# Patient Record
Sex: Male | Born: 1935 | Race: White | Hispanic: No | Marital: Married | Smoking: Former smoker
Health system: Southern US, Community
[De-identification: ages and names within clinical notes are randomized; demographics above are authoritative.]

## PROBLEM LIST (undated history)

## (undated) DIAGNOSIS — I700 Atherosclerosis of aorta: Secondary | ICD-10-CM

## (undated) DIAGNOSIS — E538 Deficiency of other specified B group vitamins: Secondary | ICD-10-CM

## (undated) DIAGNOSIS — I4891 Unspecified atrial fibrillation: Secondary | ICD-10-CM

## (undated) DIAGNOSIS — E8809 Other disorders of plasma-protein metabolism, not elsewhere classified: Secondary | ICD-10-CM

## (undated) DIAGNOSIS — N401 Enlarged prostate with lower urinary tract symptoms: Secondary | ICD-10-CM

## (undated) DIAGNOSIS — I4589 Other specified conduction disorders: Secondary | ICD-10-CM

## (undated) DIAGNOSIS — R9430 Abnormal result of cardiovascular function study, unspecified: Secondary | ICD-10-CM

## (undated) DIAGNOSIS — B029 Zoster without complications: Secondary | ICD-10-CM

## (undated) DIAGNOSIS — G4733 Obstructive sleep apnea (adult) (pediatric): Secondary | ICD-10-CM

## (undated) DIAGNOSIS — C4490 Unspecified malignant neoplasm of skin, unspecified: Secondary | ICD-10-CM

## (undated) DIAGNOSIS — R42 Dizziness and giddiness: Secondary | ICD-10-CM

## (undated) DIAGNOSIS — K55059 Acute (reversible) ischemia of intestine, part and extent unspecified: Secondary | ICD-10-CM

## (undated) DIAGNOSIS — I340 Nonrheumatic mitral (valve) insufficiency: Secondary | ICD-10-CM

## (undated) DIAGNOSIS — K5090 Crohn's disease, unspecified, without complications: Secondary | ICD-10-CM

## (undated) DIAGNOSIS — E876 Hypokalemia: Secondary | ICD-10-CM

## (undated) DIAGNOSIS — Z7901 Long term (current) use of anticoagulants: Secondary | ICD-10-CM

## (undated) DIAGNOSIS — K55069 Acute infarction of intestine, part and extent unspecified: Secondary | ICD-10-CM

## (undated) DIAGNOSIS — D509 Iron deficiency anemia, unspecified: Secondary | ICD-10-CM

## (undated) DIAGNOSIS — K635 Polyp of colon: Secondary | ICD-10-CM

## (undated) DIAGNOSIS — I5022 Chronic systolic (congestive) heart failure: Secondary | ICD-10-CM

## (undated) DIAGNOSIS — B370 Candidal stomatitis: Secondary | ICD-10-CM

## (undated) DIAGNOSIS — N644 Mastodynia: Secondary | ICD-10-CM

## (undated) DIAGNOSIS — G3184 Mild cognitive impairment, so stated: Secondary | ICD-10-CM

## (undated) DIAGNOSIS — N138 Other obstructive and reflux uropathy: Secondary | ICD-10-CM

## (undated) DIAGNOSIS — R5383 Other fatigue: Secondary | ICD-10-CM

## (undated) DIAGNOSIS — K7460 Unspecified cirrhosis of liver: Secondary | ICD-10-CM

## (undated) DIAGNOSIS — R251 Tremor, unspecified: Secondary | ICD-10-CM

## (undated) DIAGNOSIS — R109 Unspecified abdominal pain: Secondary | ICD-10-CM

## (undated) DIAGNOSIS — E785 Hyperlipidemia, unspecified: Secondary | ICD-10-CM

## (undated) DIAGNOSIS — I10 Essential (primary) hypertension: Secondary | ICD-10-CM

## (undated) DIAGNOSIS — J209 Acute bronchitis, unspecified: Secondary | ICD-10-CM

## (undated) DIAGNOSIS — M549 Dorsalgia, unspecified: Secondary | ICD-10-CM

## (undated) DIAGNOSIS — R600 Localized edema: Secondary | ICD-10-CM

## (undated) DIAGNOSIS — I447 Left bundle-branch block, unspecified: Secondary | ICD-10-CM

## (undated) DIAGNOSIS — I739 Peripheral vascular disease, unspecified: Secondary | ICD-10-CM

## (undated) DIAGNOSIS — I255 Ischemic cardiomyopathy: Secondary | ICD-10-CM

## (undated) DIAGNOSIS — I2510 Atherosclerotic heart disease of native coronary artery without angina pectoris: Secondary | ICD-10-CM

## (undated) DIAGNOSIS — D696 Thrombocytopenia, unspecified: Secondary | ICD-10-CM

## (undated) DIAGNOSIS — F419 Anxiety disorder, unspecified: Secondary | ICD-10-CM

## (undated) DIAGNOSIS — M353 Polymyalgia rheumatica: Secondary | ICD-10-CM

## (undated) DIAGNOSIS — E559 Vitamin D deficiency, unspecified: Secondary | ICD-10-CM

## (undated) DIAGNOSIS — K219 Gastro-esophageal reflux disease without esophagitis: Secondary | ICD-10-CM

## (undated) DIAGNOSIS — I779 Disorder of arteries and arterioles, unspecified: Secondary | ICD-10-CM

## (undated) DIAGNOSIS — R001 Bradycardia, unspecified: Secondary | ICD-10-CM

## (undated) DIAGNOSIS — K439 Ventral hernia without obstruction or gangrene: Secondary | ICD-10-CM

## (undated) DIAGNOSIS — Z95810 Presence of automatic (implantable) cardiac defibrillator: Secondary | ICD-10-CM

## (undated) DIAGNOSIS — R739 Hyperglycemia, unspecified: Secondary | ICD-10-CM

## (undated) DIAGNOSIS — L309 Dermatitis, unspecified: Secondary | ICD-10-CM

## (undated) DIAGNOSIS — IMO0002 Reserved for concepts with insufficient information to code with codable children: Secondary | ICD-10-CM

## (undated) DIAGNOSIS — I714 Abdominal aortic aneurysm, without rupture, unspecified: Secondary | ICD-10-CM

## (undated) DIAGNOSIS — M1990 Unspecified osteoarthritis, unspecified site: Secondary | ICD-10-CM

## (undated) DIAGNOSIS — J309 Allergic rhinitis, unspecified: Secondary | ICD-10-CM

## (undated) HISTORY — DX: Other specified conduction disorders: I45.89

## (undated) HISTORY — DX: Unspecified abdominal pain: R10.9

## (undated) HISTORY — DX: Presence of automatic (implantable) cardiac defibrillator: Z95.810

## (undated) HISTORY — DX: Bradycardia, unspecified: R00.1

## (undated) HISTORY — DX: Essential (primary) hypertension: I10

## (undated) HISTORY — DX: Atherosclerosis of aorta: I70.0

## (undated) HISTORY — DX: Deficiency of other specified B group vitamins: E53.8

## (undated) HISTORY — DX: Dizziness and giddiness: R42

## (undated) HISTORY — DX: Atherosclerotic heart disease of native coronary artery without angina pectoris: I25.10

## (undated) HISTORY — DX: Other obstructive and reflux uropathy: N13.8

## (undated) HISTORY — DX: Obstructive sleep apnea (adult) (pediatric): G47.33

## (undated) HISTORY — DX: Hypokalemia: E87.6

## (undated) HISTORY — DX: Vitamin D deficiency, unspecified: E55.9

## (undated) HISTORY — DX: Acute infarction of intestine, part and extent unspecified: K55.069

## (undated) HISTORY — DX: Anxiety disorder, unspecified: F41.9

## (undated) HISTORY — DX: Other fatigue: R53.83

## (undated) HISTORY — DX: Chronic systolic (congestive) heart failure: I50.22

## (undated) HISTORY — DX: Abnormal result of cardiovascular function study, unspecified: R94.30

## (undated) HISTORY — DX: Unspecified atrial fibrillation: I48.91

## (undated) HISTORY — DX: Hyperglycemia, unspecified: R73.9

## (undated) HISTORY — DX: Long term (current) use of anticoagulants: Z79.01

## (undated) HISTORY — PX: OTHER SURGICAL HISTORY: SHX169

## (undated) HISTORY — DX: Allergic rhinitis, unspecified: J30.9

## (undated) HISTORY — DX: Mild cognitive impairment, so stated: G31.84

## (undated) HISTORY — DX: Reserved for concepts with insufficient information to code with codable children: IMO0002

## (undated) HISTORY — DX: Dermatitis, unspecified: L30.9

## (undated) HISTORY — DX: Tremor, unspecified: R25.1

## (undated) HISTORY — DX: Zoster without complications: B02.9

## (undated) HISTORY — DX: Localized edema: R60.0

## (undated) HISTORY — DX: Mastodynia: N64.4

## (undated) HISTORY — DX: Candidal stomatitis: B37.0

## (undated) HISTORY — DX: Abdominal aortic aneurysm, without rupture, unspecified: I71.40

## (undated) HISTORY — DX: Abdominal aortic aneurysm, without rupture: I71.4

## (undated) HISTORY — DX: Iron deficiency anemia, unspecified: D50.9

## (undated) HISTORY — DX: Unspecified cirrhosis of liver: K74.60

## (undated) HISTORY — DX: Hyperlipidemia, unspecified: E78.5

## (undated) HISTORY — DX: Dorsalgia, unspecified: M54.9

## (undated) HISTORY — DX: Acute (reversible) ischemia of intestine, part and extent unspecified: K55.059

## (undated) HISTORY — DX: Acute bronchitis, unspecified: J20.9

## (undated) HISTORY — DX: Other disorders of plasma-protein metabolism, not elsewhere classified: E88.09

## (undated) HISTORY — DX: Benign prostatic hyperplasia with lower urinary tract symptoms: N40.1

## (undated) HISTORY — DX: Polyp of colon: K63.5

## (undated) HISTORY — DX: Unspecified osteoarthritis, unspecified site: M19.90

## (undated) HISTORY — DX: Ventral hernia without obstruction or gangrene: K43.9

## (undated) HISTORY — DX: Nonrheumatic mitral (valve) insufficiency: I34.0

## (undated) HISTORY — DX: Gastro-esophageal reflux disease without esophagitis: K21.9

## (undated) HISTORY — DX: Unspecified malignant neoplasm of skin, unspecified: C44.90

## (undated) HISTORY — DX: Left bundle-branch block, unspecified: I44.7

## (undated) HISTORY — DX: Ischemic cardiomyopathy: I25.5

## (undated) HISTORY — DX: Peripheral vascular disease, unspecified: I73.9

## (undated) HISTORY — DX: Disorder of arteries and arterioles, unspecified: I77.9

## (undated) HISTORY — DX: Polymyalgia rheumatica: M35.3

## (undated) HISTORY — DX: Crohn's disease, unspecified, without complications: K50.90

---

## 1992-02-01 HISTORY — PX: CORONARY ARTERY BYPASS GRAFT: SHX141

## 2000-02-01 HISTORY — PX: OTHER SURGICAL HISTORY: SHX169

## 2000-03-23 ENCOUNTER — Encounter (INDEPENDENT_AMBULATORY_CARE_PROVIDER_SITE_OTHER): Payer: Self-pay | Admitting: Specialist

## 2000-03-23 ENCOUNTER — Encounter: Payer: Self-pay | Admitting: Pulmonary Disease

## 2000-03-23 ENCOUNTER — Observation Stay (HOSPITAL_COMMUNITY): Admission: RE | Admit: 2000-03-23 | Discharge: 2000-03-24 | Payer: Self-pay | Admitting: Pulmonary Disease

## 2000-03-23 ENCOUNTER — Encounter: Payer: Self-pay | Admitting: General Surgery

## 2001-01-31 HISTORY — PX: OTHER SURGICAL HISTORY: SHX169

## 2001-02-01 ENCOUNTER — Encounter (INDEPENDENT_AMBULATORY_CARE_PROVIDER_SITE_OTHER): Payer: Self-pay | Admitting: *Deleted

## 2001-02-01 ENCOUNTER — Ambulatory Visit (HOSPITAL_COMMUNITY): Admission: RE | Admit: 2001-02-01 | Discharge: 2001-02-01 | Payer: Self-pay | Admitting: Pulmonary Disease

## 2001-02-01 ENCOUNTER — Encounter: Payer: Self-pay | Admitting: Pulmonary Disease

## 2001-02-01 ENCOUNTER — Inpatient Hospital Stay (HOSPITAL_COMMUNITY): Admission: EM | Admit: 2001-02-01 | Discharge: 2001-02-15 | Payer: Self-pay | Admitting: Emergency Medicine

## 2001-02-03 ENCOUNTER — Encounter: Payer: Self-pay | Admitting: Pulmonary Disease

## 2001-02-05 ENCOUNTER — Encounter: Payer: Self-pay | Admitting: General Surgery

## 2001-02-05 ENCOUNTER — Encounter: Payer: Self-pay | Admitting: Vascular Surgery

## 2001-02-06 ENCOUNTER — Encounter: Payer: Self-pay | Admitting: Vascular Surgery

## 2001-02-27 ENCOUNTER — Encounter: Payer: Self-pay | Admitting: General Surgery

## 2001-02-27 ENCOUNTER — Ambulatory Visit (HOSPITAL_COMMUNITY): Admission: RE | Admit: 2001-02-27 | Discharge: 2001-02-27 | Payer: Self-pay | Admitting: General Surgery

## 2001-12-01 HISTORY — PX: OTHER SURGICAL HISTORY: SHX169

## 2001-12-03 ENCOUNTER — Encounter: Payer: Self-pay | Admitting: General Surgery

## 2001-12-10 ENCOUNTER — Inpatient Hospital Stay (HOSPITAL_COMMUNITY): Admission: RE | Admit: 2001-12-10 | Discharge: 2001-12-13 | Payer: Self-pay | Admitting: General Surgery

## 2002-07-08 ENCOUNTER — Encounter: Payer: Self-pay | Admitting: Pulmonary Disease

## 2002-07-08 ENCOUNTER — Ambulatory Visit (HOSPITAL_COMMUNITY): Admission: RE | Admit: 2002-07-08 | Discharge: 2002-07-08 | Payer: Self-pay | Admitting: Pulmonary Disease

## 2002-07-10 ENCOUNTER — Encounter: Payer: Self-pay | Admitting: Internal Medicine

## 2002-07-10 ENCOUNTER — Ambulatory Visit (HOSPITAL_COMMUNITY): Admission: RE | Admit: 2002-07-10 | Discharge: 2002-07-10 | Payer: Self-pay | Admitting: Internal Medicine

## 2002-09-23 ENCOUNTER — Ambulatory Visit (HOSPITAL_COMMUNITY): Admission: RE | Admit: 2002-09-23 | Discharge: 2002-09-23 | Payer: Self-pay | Admitting: Gastroenterology

## 2002-09-23 ENCOUNTER — Encounter: Payer: Self-pay | Admitting: Gastroenterology

## 2002-09-25 ENCOUNTER — Encounter: Payer: Self-pay | Admitting: Gastroenterology

## 2002-09-25 ENCOUNTER — Inpatient Hospital Stay (HOSPITAL_COMMUNITY): Admission: AD | Admit: 2002-09-25 | Discharge: 2002-09-27 | Payer: Self-pay | Admitting: Gastroenterology

## 2002-11-07 ENCOUNTER — Ambulatory Visit (HOSPITAL_COMMUNITY): Admission: RE | Admit: 2002-11-07 | Discharge: 2002-11-07 | Payer: Self-pay | Admitting: Gastroenterology

## 2002-11-07 ENCOUNTER — Encounter: Payer: Self-pay | Admitting: Gastroenterology

## 2002-11-14 ENCOUNTER — Inpatient Hospital Stay (HOSPITAL_COMMUNITY): Admission: EM | Admit: 2002-11-14 | Discharge: 2002-11-19 | Payer: Self-pay | Admitting: Gastroenterology

## 2002-11-18 ENCOUNTER — Encounter: Payer: Self-pay | Admitting: Gastroenterology

## 2003-11-12 ENCOUNTER — Ambulatory Visit: Payer: Self-pay | Admitting: Cardiology

## 2004-01-06 ENCOUNTER — Ambulatory Visit: Payer: Self-pay

## 2004-01-14 ENCOUNTER — Ambulatory Visit: Payer: Self-pay | Admitting: Cardiology

## 2004-01-20 ENCOUNTER — Ambulatory Visit: Payer: Self-pay | Admitting: Internal Medicine

## 2004-01-29 ENCOUNTER — Ambulatory Visit: Payer: Self-pay | Admitting: Pulmonary Disease

## 2004-01-30 ENCOUNTER — Ambulatory Visit: Payer: Self-pay | Admitting: Internal Medicine

## 2004-03-03 ENCOUNTER — Ambulatory Visit: Payer: Self-pay | Admitting: Cardiology

## 2004-03-10 ENCOUNTER — Ambulatory Visit: Payer: Self-pay | Admitting: Internal Medicine

## 2004-03-22 ENCOUNTER — Ambulatory Visit: Payer: Self-pay | Admitting: *Deleted

## 2004-04-01 ENCOUNTER — Ambulatory Visit: Payer: Self-pay | Admitting: Cardiology

## 2004-04-22 ENCOUNTER — Ambulatory Visit: Payer: Self-pay | Admitting: Cardiology

## 2004-05-11 ENCOUNTER — Ambulatory Visit: Payer: Self-pay | Admitting: Cardiology

## 2004-05-31 ENCOUNTER — Ambulatory Visit: Payer: Self-pay | Admitting: Cardiology

## 2004-06-14 ENCOUNTER — Ambulatory Visit: Payer: Self-pay | Admitting: Cardiology

## 2004-06-21 ENCOUNTER — Ambulatory Visit: Payer: Self-pay | Admitting: Pulmonary Disease

## 2004-06-24 ENCOUNTER — Ambulatory Visit: Payer: Self-pay | Admitting: Cardiology

## 2004-07-01 ENCOUNTER — Ambulatory Visit: Payer: Self-pay | Admitting: Pulmonary Disease

## 2004-07-05 ENCOUNTER — Ambulatory Visit: Payer: Self-pay | Admitting: Cardiology

## 2004-07-14 ENCOUNTER — Ambulatory Visit: Payer: Self-pay | Admitting: Cardiology

## 2004-07-14 ENCOUNTER — Ambulatory Visit: Payer: Self-pay

## 2004-07-19 ENCOUNTER — Inpatient Hospital Stay (HOSPITAL_BASED_OUTPATIENT_CLINIC_OR_DEPARTMENT_OTHER): Admission: RE | Admit: 2004-07-19 | Discharge: 2004-07-19 | Payer: Self-pay | Admitting: Cardiovascular Disease

## 2004-07-19 ENCOUNTER — Ambulatory Visit: Payer: Self-pay | Admitting: Cardiology

## 2004-07-22 ENCOUNTER — Ambulatory Visit: Payer: Self-pay | Admitting: Cardiovascular Disease

## 2004-07-23 ENCOUNTER — Ambulatory Visit: Payer: Self-pay | Admitting: Cardiology

## 2004-07-29 ENCOUNTER — Ambulatory Visit: Payer: Self-pay | Admitting: Cardiology

## 2004-08-12 ENCOUNTER — Ambulatory Visit: Payer: Self-pay | Admitting: Cardiology

## 2004-08-18 ENCOUNTER — Ambulatory Visit: Payer: Self-pay | Admitting: Internal Medicine

## 2004-09-01 ENCOUNTER — Ambulatory Visit: Payer: Self-pay | Admitting: Internal Medicine

## 2004-09-01 ENCOUNTER — Ambulatory Visit: Payer: Self-pay | Admitting: *Deleted

## 2004-09-01 ENCOUNTER — Ambulatory Visit: Payer: Self-pay

## 2004-09-02 ENCOUNTER — Ambulatory Visit: Payer: Self-pay | Admitting: Internal Medicine

## 2004-09-07 ENCOUNTER — Inpatient Hospital Stay (HOSPITAL_COMMUNITY): Admission: RE | Admit: 2004-09-07 | Discharge: 2004-09-08 | Payer: Self-pay | Admitting: Internal Medicine

## 2004-09-08 ENCOUNTER — Ambulatory Visit: Payer: Self-pay | Admitting: Internal Medicine

## 2004-09-22 ENCOUNTER — Ambulatory Visit: Payer: Self-pay

## 2004-09-29 ENCOUNTER — Ambulatory Visit: Payer: Self-pay | Admitting: *Deleted

## 2004-10-07 ENCOUNTER — Ambulatory Visit: Payer: Self-pay | Admitting: Internal Medicine

## 2004-10-11 ENCOUNTER — Inpatient Hospital Stay (HOSPITAL_COMMUNITY): Admission: AD | Admit: 2004-10-11 | Discharge: 2004-10-12 | Payer: Self-pay | Admitting: Internal Medicine

## 2004-10-11 ENCOUNTER — Ambulatory Visit: Payer: Self-pay | Admitting: Internal Medicine

## 2004-10-15 ENCOUNTER — Ambulatory Visit: Payer: Self-pay

## 2004-10-15 ENCOUNTER — Ambulatory Visit: Payer: Self-pay | Admitting: Cardiology

## 2004-10-21 ENCOUNTER — Ambulatory Visit: Payer: Self-pay | Admitting: Cardiology

## 2004-10-21 ENCOUNTER — Ambulatory Visit: Payer: Self-pay

## 2004-11-01 ENCOUNTER — Ambulatory Visit: Payer: Self-pay | Admitting: Cardiology

## 2004-11-08 ENCOUNTER — Ambulatory Visit: Payer: Self-pay | Admitting: Cardiology

## 2004-11-16 ENCOUNTER — Ambulatory Visit: Payer: Self-pay | Admitting: Cardiology

## 2004-11-23 ENCOUNTER — Ambulatory Visit: Payer: Self-pay | Admitting: Internal Medicine

## 2004-11-30 ENCOUNTER — Ambulatory Visit: Payer: Self-pay | Admitting: Cardiology

## 2004-12-10 ENCOUNTER — Ambulatory Visit: Payer: Self-pay | Admitting: Cardiology

## 2004-12-21 ENCOUNTER — Ambulatory Visit: Payer: Self-pay

## 2004-12-22 ENCOUNTER — Ambulatory Visit: Payer: Self-pay | Admitting: Pulmonary Disease

## 2004-12-30 ENCOUNTER — Ambulatory Visit: Payer: Self-pay | Admitting: Pulmonary Disease

## 2005-01-04 ENCOUNTER — Ambulatory Visit: Payer: Self-pay | Admitting: Cardiology

## 2005-01-18 ENCOUNTER — Ambulatory Visit: Payer: Self-pay | Admitting: Cardiovascular Disease

## 2005-02-01 ENCOUNTER — Ambulatory Visit: Payer: Self-pay | Admitting: *Deleted

## 2005-02-02 ENCOUNTER — Ambulatory Visit: Payer: Self-pay | Admitting: Pulmonary Disease

## 2005-02-04 ENCOUNTER — Ambulatory Visit: Payer: Self-pay | Admitting: Cardiology

## 2005-02-11 ENCOUNTER — Ambulatory Visit: Payer: Self-pay | Admitting: Internal Medicine

## 2005-02-11 ENCOUNTER — Ambulatory Visit: Payer: Self-pay | Admitting: Pulmonary Disease

## 2005-02-23 ENCOUNTER — Ambulatory Visit: Payer: Self-pay | Admitting: Cardiology

## 2005-02-23 ENCOUNTER — Ambulatory Visit: Payer: Self-pay | Admitting: Internal Medicine

## 2005-03-09 ENCOUNTER — Ambulatory Visit: Payer: Self-pay | Admitting: Cardiology

## 2005-03-17 ENCOUNTER — Ambulatory Visit: Payer: Self-pay | Admitting: Pulmonary Disease

## 2005-03-23 ENCOUNTER — Ambulatory Visit: Payer: Self-pay | Admitting: Cardiology

## 2005-04-06 ENCOUNTER — Ambulatory Visit: Payer: Self-pay | Admitting: Internal Medicine

## 2005-04-20 ENCOUNTER — Ambulatory Visit: Payer: Self-pay | Admitting: Cardiology

## 2005-05-03 ENCOUNTER — Ambulatory Visit: Payer: Self-pay | Admitting: Cardiology

## 2005-05-03 ENCOUNTER — Encounter: Payer: Self-pay | Admitting: Cardiovascular Disease

## 2005-05-03 ENCOUNTER — Ambulatory Visit: Payer: Self-pay

## 2005-05-18 ENCOUNTER — Ambulatory Visit: Payer: Self-pay | Admitting: *Deleted

## 2005-05-26 ENCOUNTER — Ambulatory Visit: Payer: Self-pay | Admitting: Internal Medicine

## 2005-05-26 ENCOUNTER — Ambulatory Visit: Payer: Self-pay | Admitting: Cardiology

## 2005-06-01 ENCOUNTER — Ambulatory Visit: Payer: Self-pay | Admitting: Cardiology

## 2005-06-09 ENCOUNTER — Ambulatory Visit: Payer: Self-pay | Admitting: Cardiology

## 2005-06-16 ENCOUNTER — Ambulatory Visit: Payer: Self-pay | Admitting: Pulmonary Disease

## 2005-06-23 ENCOUNTER — Ambulatory Visit: Payer: Self-pay | Admitting: Pulmonary Disease

## 2005-06-23 ENCOUNTER — Ambulatory Visit: Payer: Self-pay | Admitting: Cardiology

## 2005-07-07 ENCOUNTER — Ambulatory Visit: Payer: Self-pay | Admitting: Internal Medicine

## 2005-07-15 ENCOUNTER — Ambulatory Visit: Payer: Self-pay | Admitting: Cardiology

## 2005-07-21 ENCOUNTER — Ambulatory Visit: Payer: Self-pay | Admitting: Cardiology

## 2005-08-05 ENCOUNTER — Ambulatory Visit: Payer: Self-pay | Admitting: Internal Medicine

## 2005-08-05 ENCOUNTER — Ambulatory Visit: Payer: Self-pay | Admitting: Pulmonary Disease

## 2005-08-11 ENCOUNTER — Ambulatory Visit: Payer: Self-pay | Admitting: Cardiology

## 2005-09-08 ENCOUNTER — Ambulatory Visit: Payer: Self-pay | Admitting: *Deleted

## 2005-09-08 ENCOUNTER — Ambulatory Visit: Payer: Self-pay | Admitting: Pulmonary Disease

## 2005-09-12 ENCOUNTER — Encounter: Payer: Self-pay | Admitting: Cardiology

## 2005-09-12 ENCOUNTER — Ambulatory Visit: Payer: Self-pay

## 2005-09-12 ENCOUNTER — Ambulatory Visit: Payer: Self-pay | Admitting: Internal Medicine

## 2005-09-22 ENCOUNTER — Ambulatory Visit: Payer: Self-pay | Admitting: Cardiology

## 2005-10-06 ENCOUNTER — Ambulatory Visit: Payer: Self-pay | Admitting: Cardiology

## 2005-10-20 ENCOUNTER — Ambulatory Visit: Payer: Self-pay | Admitting: Cardiology

## 2005-11-07 ENCOUNTER — Ambulatory Visit: Payer: Self-pay | Admitting: Cardiovascular Disease

## 2005-11-16 ENCOUNTER — Ambulatory Visit: Payer: Self-pay | Admitting: Internal Medicine

## 2005-11-30 ENCOUNTER — Ambulatory Visit: Payer: Self-pay | Admitting: Cardiology

## 2005-12-13 ENCOUNTER — Ambulatory Visit: Payer: Self-pay | Admitting: Internal Medicine

## 2005-12-21 ENCOUNTER — Ambulatory Visit: Payer: Self-pay | Admitting: Cardiology

## 2006-01-04 ENCOUNTER — Ambulatory Visit: Payer: Self-pay | Admitting: Cardiology

## 2006-01-19 ENCOUNTER — Ambulatory Visit: Payer: Self-pay | Admitting: Cardiology

## 2006-01-31 DIAGNOSIS — D696 Thrombocytopenia, unspecified: Secondary | ICD-10-CM

## 2006-01-31 HISTORY — DX: Thrombocytopenia, unspecified: D69.6

## 2006-02-06 ENCOUNTER — Ambulatory Visit: Payer: Self-pay | Admitting: Cardiology

## 2006-02-27 ENCOUNTER — Ambulatory Visit: Payer: Self-pay | Admitting: Internal Medicine

## 2006-03-13 ENCOUNTER — Ambulatory Visit: Payer: Self-pay | Admitting: Cardiology

## 2006-03-13 ENCOUNTER — Ambulatory Visit: Payer: Self-pay | Admitting: Internal Medicine

## 2006-03-27 ENCOUNTER — Ambulatory Visit: Payer: Self-pay | Admitting: Cardiovascular Disease

## 2006-04-03 ENCOUNTER — Ambulatory Visit: Payer: Self-pay | Admitting: Cardiology

## 2006-04-10 ENCOUNTER — Ambulatory Visit: Payer: Self-pay | Admitting: Cardiology

## 2006-05-01 ENCOUNTER — Ambulatory Visit: Payer: Self-pay | Admitting: Cardiovascular Disease

## 2006-05-02 ENCOUNTER — Ambulatory Visit: Payer: Self-pay | Admitting: Pulmonary Disease

## 2006-05-03 ENCOUNTER — Ambulatory Visit: Payer: Self-pay | Admitting: Pulmonary Disease

## 2006-05-03 LAB — CONVERTED CEMR LAB
ALT: 59 units/L — ABNORMAL HIGH (ref 0–40)
AST: 38 units/L — ABNORMAL HIGH (ref 0–37)
Albumin: 3.7 g/dL (ref 3.5–5.2)
Alkaline Phosphatase: 38 units/L — ABNORMAL LOW (ref 39–117)
BUN: 8 mg/dL (ref 6–23)
Basophils Absolute: 0 10*3/uL (ref 0.0–0.1)
Basophils Relative: 0.3 % (ref 0.0–1.0)
CO2: 35 meq/L — ABNORMAL HIGH (ref 19–32)
Calcium: 9.4 mg/dL (ref 8.4–10.5)
Chloride: 108 meq/L (ref 96–112)
Cholesterol: 138 mg/dL (ref 0–200)
Creatinine, Ser: 1.1 mg/dL (ref 0.4–1.5)
HCT: 42.2 % (ref 39.0–52.0)
HDL: 38.1 mg/dL — ABNORMAL LOW (ref >39.0)
Hemoglobin: 14.7 g/dL (ref 13.0–17.0)
LDL Cholesterol: 72 mg/dL (ref 0–99)
MCHC: 34.8 g/dL (ref 30.0–36.0)
Monocytes Absolute: 0.6 10*3/uL (ref 0.2–0.7)
Monocytes Relative: 12.3 % — ABNORMAL HIGH (ref 3.0–11.0)
PSA: 2.49 ng/mL (ref 0.10–4.00)
RBC: 4.54 M/uL (ref 4.22–5.81)
RDW: 15.5 % — ABNORMAL HIGH (ref 11.5–14.6)
Total Bilirubin: 3.3 mg/dL — ABNORMAL HIGH (ref 0.3–1.2)
Total CHOL/HDL Ratio: 3.6
Total Protein: 5.9 g/dL — ABNORMAL LOW (ref 6.0–8.3)
VLDL: 28 mg/dL (ref 0–40)

## 2006-05-15 ENCOUNTER — Ambulatory Visit: Payer: Self-pay | Admitting: Cardiology

## 2006-06-05 ENCOUNTER — Ambulatory Visit: Payer: Self-pay | Admitting: Cardiovascular Disease

## 2006-06-15 ENCOUNTER — Ambulatory Visit: Payer: Self-pay | Admitting: Internal Medicine

## 2006-07-02 HISTORY — PX: OTHER SURGICAL HISTORY: SHX169

## 2006-07-03 ENCOUNTER — Ambulatory Visit: Payer: Self-pay | Admitting: Internal Medicine

## 2006-07-10 ENCOUNTER — Ambulatory Visit: Payer: Self-pay | Admitting: Internal Medicine

## 2006-07-10 ENCOUNTER — Ambulatory Visit: Payer: Self-pay | Admitting: Cardiovascular Disease

## 2006-07-17 ENCOUNTER — Ambulatory Visit (HOSPITAL_COMMUNITY): Admission: RE | Admit: 2006-07-17 | Discharge: 2006-07-17 | Payer: Self-pay | Admitting: General Surgery

## 2006-07-20 ENCOUNTER — Ambulatory Visit: Payer: Self-pay | Admitting: Internal Medicine

## 2006-07-25 ENCOUNTER — Ambulatory Visit: Payer: Self-pay | Admitting: Cardiology

## 2006-08-08 ENCOUNTER — Ambulatory Visit: Payer: Self-pay | Admitting: Cardiology

## 2006-08-22 ENCOUNTER — Ambulatory Visit: Payer: Self-pay | Admitting: Internal Medicine

## 2006-08-29 ENCOUNTER — Ambulatory Visit: Payer: Self-pay | Admitting: Internal Medicine

## 2006-09-04 ENCOUNTER — Ambulatory Visit: Payer: Self-pay | Admitting: Cardiology

## 2006-09-05 ENCOUNTER — Ambulatory Visit: Payer: Self-pay | Admitting: Cardiology

## 2006-09-12 ENCOUNTER — Ambulatory Visit: Payer: Self-pay | Admitting: Cardiology

## 2006-09-26 ENCOUNTER — Ambulatory Visit: Payer: Self-pay | Admitting: Cardiology

## 2006-10-10 ENCOUNTER — Ambulatory Visit: Payer: Self-pay | Admitting: Cardiology

## 2006-10-30 ENCOUNTER — Ambulatory Visit: Payer: Self-pay | Admitting: Pulmonary Disease

## 2006-11-02 ENCOUNTER — Ambulatory Visit: Payer: Self-pay | Admitting: Cardiology

## 2006-11-30 ENCOUNTER — Ambulatory Visit: Payer: Self-pay | Admitting: Internal Medicine

## 2006-12-05 ENCOUNTER — Ambulatory Visit: Payer: Self-pay | Admitting: Internal Medicine

## 2006-12-21 ENCOUNTER — Ambulatory Visit: Payer: Self-pay | Admitting: Cardiology

## 2007-01-04 ENCOUNTER — Ambulatory Visit: Payer: Self-pay | Admitting: Cardiovascular Disease

## 2007-01-31 ENCOUNTER — Ambulatory Visit: Payer: Self-pay | Admitting: Cardiology

## 2007-02-07 ENCOUNTER — Ambulatory Visit: Payer: Self-pay | Admitting: Cardiovascular Disease

## 2007-02-21 ENCOUNTER — Ambulatory Visit: Payer: Self-pay | Admitting: Cardiovascular Disease

## 2007-02-21 ENCOUNTER — Ambulatory Visit: Payer: Self-pay | Admitting: Pulmonary Disease

## 2007-02-21 DIAGNOSIS — F411 Generalized anxiety disorder: Secondary | ICD-10-CM | POA: Insufficient documentation

## 2007-02-21 DIAGNOSIS — M1990 Unspecified osteoarthritis, unspecified site: Secondary | ICD-10-CM | POA: Insufficient documentation

## 2007-02-21 DIAGNOSIS — K219 Gastro-esophageal reflux disease without esophagitis: Secondary | ICD-10-CM

## 2007-02-21 DIAGNOSIS — B029 Zoster without complications: Secondary | ICD-10-CM | POA: Insufficient documentation

## 2007-02-21 HISTORY — DX: Gastro-esophageal reflux disease without esophagitis: K21.9

## 2007-03-02 ENCOUNTER — Encounter: Payer: Self-pay | Admitting: Pulmonary Disease

## 2007-03-09 ENCOUNTER — Ambulatory Visit: Payer: Self-pay | Admitting: Cardiology

## 2007-03-16 ENCOUNTER — Ambulatory Visit: Payer: Self-pay | Admitting: Internal Medicine

## 2007-03-21 ENCOUNTER — Ambulatory Visit: Payer: Self-pay | Admitting: Internal Medicine

## 2007-03-23 ENCOUNTER — Ambulatory Visit: Payer: Self-pay | Admitting: Pulmonary Disease

## 2007-03-23 DIAGNOSIS — D126 Benign neoplasm of colon, unspecified: Secondary | ICD-10-CM | POA: Insufficient documentation

## 2007-03-23 DIAGNOSIS — M353 Polymyalgia rheumatica: Secondary | ICD-10-CM | POA: Insufficient documentation

## 2007-03-23 DIAGNOSIS — K55059 Acute (reversible) ischemia of intestine, part and extent unspecified: Secondary | ICD-10-CM | POA: Insufficient documentation

## 2007-03-23 DIAGNOSIS — K439 Ventral hernia without obstruction or gangrene: Secondary | ICD-10-CM | POA: Insufficient documentation

## 2007-04-09 ENCOUNTER — Ambulatory Visit: Payer: Self-pay | Admitting: Cardiology

## 2007-04-30 ENCOUNTER — Ambulatory Visit: Payer: Self-pay | Admitting: Cardiology

## 2007-05-10 ENCOUNTER — Ambulatory Visit: Payer: Self-pay | Admitting: Cardiology

## 2007-05-10 LAB — CONVERTED CEMR LAB
AST: 42 units/L — ABNORMAL HIGH (ref 0–37)
Albumin: 4 g/dL (ref 3.5–5.2)
HDL: 33 mg/dL — ABNORMAL LOW (ref >39.0)
LDL Cholesterol: 63 mg/dL (ref 0–99)
Total Bilirubin: 3.3 mg/dL — ABNORMAL HIGH (ref 0.3–1.2)
Total CHOL/HDL Ratio: 3.6
Triglycerides: 111 mg/dL (ref 0–149)
VLDL: 22 mg/dL (ref 0–40)

## 2007-05-14 ENCOUNTER — Ambulatory Visit: Payer: Self-pay | Admitting: Cardiology

## 2007-05-14 ENCOUNTER — Ambulatory Visit: Payer: Self-pay | Admitting: Internal Medicine

## 2007-05-28 ENCOUNTER — Ambulatory Visit: Payer: Self-pay | Admitting: Cardiovascular Disease

## 2007-06-12 ENCOUNTER — Ambulatory Visit: Payer: Self-pay | Admitting: Cardiovascular Disease

## 2007-06-28 ENCOUNTER — Encounter: Payer: Self-pay | Admitting: Pulmonary Disease

## 2007-07-03 ENCOUNTER — Ambulatory Visit: Payer: Self-pay | Admitting: Cardiology

## 2007-07-03 LAB — CONVERTED CEMR LAB
ALT: 36 units/L (ref 0–53)
Albumin: 3.9 g/dL (ref 3.5–5.2)
Bilirubin, Direct: 0.3 mg/dL (ref 0.0–0.3)
Cholesterol: 186 mg/dL (ref 0–200)
LDL Cholesterol: 120 mg/dL — ABNORMAL HIGH (ref 0–99)
Total Protein: 6.4 g/dL (ref 6.0–8.3)
Triglycerides: 109 mg/dL (ref 0–149)
VLDL: 22 mg/dL (ref 0–40)

## 2007-07-31 ENCOUNTER — Ambulatory Visit: Payer: Self-pay | Admitting: Cardiology

## 2007-08-15 ENCOUNTER — Ambulatory Visit: Payer: Self-pay

## 2007-08-15 ENCOUNTER — Ambulatory Visit: Payer: Self-pay | Admitting: Cardiology

## 2007-08-15 ENCOUNTER — Ambulatory Visit: Payer: Self-pay | Admitting: Internal Medicine

## 2007-09-04 ENCOUNTER — Ambulatory Visit: Payer: Self-pay | Admitting: Cardiology

## 2007-10-02 ENCOUNTER — Ambulatory Visit: Payer: Self-pay | Admitting: Cardiology

## 2007-10-15 ENCOUNTER — Ambulatory Visit: Payer: Self-pay | Admitting: Cardiology

## 2007-10-15 LAB — CONVERTED CEMR LAB
ALT: 62 units/L — ABNORMAL HIGH (ref 0–53)
AST: 41 units/L — ABNORMAL HIGH (ref 0–37)
Albumin: 3.9 g/dL (ref 3.5–5.2)
Alkaline Phosphatase: 59 units/L (ref 39–117)
Total Bilirubin: 2.9 mg/dL — ABNORMAL HIGH (ref 0.3–1.2)
Total CHOL/HDL Ratio: 3.8
Triglycerides: 100 mg/dL (ref 0–149)

## 2007-11-05 ENCOUNTER — Ambulatory Visit: Payer: Self-pay | Admitting: Cardiology

## 2007-11-10 ENCOUNTER — Encounter: Payer: Self-pay | Admitting: Cardiology

## 2007-11-12 ENCOUNTER — Ambulatory Visit: Payer: Self-pay | Admitting: Cardiology

## 2007-11-19 ENCOUNTER — Ambulatory Visit: Payer: Self-pay | Admitting: Internal Medicine

## 2007-12-03 ENCOUNTER — Telehealth (INDEPENDENT_AMBULATORY_CARE_PROVIDER_SITE_OTHER): Payer: Self-pay | Admitting: *Deleted

## 2007-12-04 ENCOUNTER — Ambulatory Visit: Payer: Self-pay

## 2007-12-10 ENCOUNTER — Ambulatory Visit: Payer: Self-pay | Admitting: Internal Medicine

## 2007-12-10 ENCOUNTER — Telehealth (INDEPENDENT_AMBULATORY_CARE_PROVIDER_SITE_OTHER): Payer: Self-pay | Admitting: *Deleted

## 2007-12-20 ENCOUNTER — Ambulatory Visit: Payer: Self-pay

## 2007-12-20 ENCOUNTER — Encounter: Payer: Self-pay | Admitting: Internal Medicine

## 2007-12-20 ENCOUNTER — Encounter: Payer: Self-pay | Admitting: Cardiology

## 2007-12-21 ENCOUNTER — Ambulatory Visit: Payer: Self-pay | Admitting: Cardiology

## 2007-12-25 ENCOUNTER — Ambulatory Visit: Payer: Self-pay | Admitting: Cardiology

## 2008-01-10 ENCOUNTER — Ambulatory Visit: Payer: Self-pay | Admitting: Cardiology

## 2008-01-23 ENCOUNTER — Telehealth (INDEPENDENT_AMBULATORY_CARE_PROVIDER_SITE_OTHER): Payer: Self-pay | Admitting: *Deleted

## 2008-01-24 ENCOUNTER — Ambulatory Visit: Payer: Self-pay | Admitting: Pulmonary Disease

## 2008-01-29 ENCOUNTER — Ambulatory Visit: Payer: Self-pay | Admitting: Cardiovascular Disease

## 2008-02-01 DIAGNOSIS — K635 Polyp of colon: Secondary | ICD-10-CM

## 2008-02-01 HISTORY — DX: Polyp of colon: K63.5

## 2008-02-08 ENCOUNTER — Ambulatory Visit: Payer: Self-pay | Admitting: Cardiology

## 2008-02-22 ENCOUNTER — Ambulatory Visit: Payer: Self-pay | Admitting: Internal Medicine

## 2008-02-28 ENCOUNTER — Ambulatory Visit: Payer: Self-pay | Admitting: Internal Medicine

## 2008-02-28 ENCOUNTER — Encounter: Payer: Self-pay | Admitting: Internal Medicine

## 2008-03-07 ENCOUNTER — Ambulatory Visit: Payer: Self-pay | Admitting: Cardiovascular Disease

## 2008-03-21 ENCOUNTER — Ambulatory Visit: Payer: Self-pay | Admitting: Pulmonary Disease

## 2008-03-21 DIAGNOSIS — R259 Unspecified abnormal involuntary movements: Secondary | ICD-10-CM | POA: Insufficient documentation

## 2008-03-22 LAB — CONVERTED CEMR LAB
AST: 39 units/L — ABNORMAL HIGH (ref 0–37)
Albumin: 3.9 g/dL (ref 3.5–5.2)
BUN: 9 mg/dL (ref 6–23)
Basophils Absolute: 0 10*3/uL (ref 0.0–0.1)
Chloride: 100 meq/L (ref 96–112)
Creatinine, Ser: 1 mg/dL (ref 0.4–1.5)
Crystals: NEGATIVE
Eosinophils Absolute: 0.1 10*3/uL (ref 0.0–0.7)
GFR calc non Af Amer: 78 mL/min
HCT: 45.3 % (ref 39.0–52.0)
Hemoglobin, Urine: NEGATIVE
Iron: 91 ug/dL (ref 42–165)
MCV: 96.3 fL (ref 78.0–100.0)
Monocytes Absolute: 0.8 10*3/uL (ref 0.1–1.0)
Neutrophils Relative %: 54.8 % (ref 43.0–77.0)
Nitrite: NEGATIVE
Platelets: 132 10*3/uL — ABNORMAL LOW (ref 150–400)
RDW: 15.5 % — ABNORMAL HIGH (ref 11.5–14.6)
Sed Rate: 8 mm/hr (ref 0–16)
Urobilinogen, UA: 0.2 (ref 0.0–1.0)
Vitamin B-12: 215 pg/mL (ref 211–911)

## 2008-03-25 ENCOUNTER — Ambulatory Visit: Payer: Self-pay | Admitting: Cardiovascular Disease

## 2008-04-22 ENCOUNTER — Ambulatory Visit: Payer: Self-pay | Admitting: Cardiovascular Disease

## 2008-04-22 ENCOUNTER — Encounter: Payer: Self-pay | Admitting: Pulmonary Disease

## 2008-05-13 ENCOUNTER — Ambulatory Visit: Payer: Self-pay | Admitting: Cardiology

## 2008-05-21 ENCOUNTER — Telehealth: Payer: Self-pay | Admitting: Pulmonary Disease

## 2008-06-03 ENCOUNTER — Ambulatory Visit: Payer: Self-pay | Admitting: Internal Medicine

## 2008-06-03 ENCOUNTER — Encounter: Payer: Self-pay | Admitting: Internal Medicine

## 2008-06-05 ENCOUNTER — Ambulatory Visit: Payer: Self-pay | Admitting: Internal Medicine

## 2008-06-18 LAB — CONVERTED CEMR LAB: Vit D, 25-Hydroxy: 23 ng/mL — ABNORMAL LOW

## 2008-06-25 DIAGNOSIS — R5383 Other fatigue: Secondary | ICD-10-CM

## 2008-06-25 DIAGNOSIS — I447 Left bundle-branch block, unspecified: Secondary | ICD-10-CM | POA: Insufficient documentation

## 2008-06-25 HISTORY — DX: Other fatigue: R53.83

## 2008-06-26 ENCOUNTER — Ambulatory Visit: Payer: Self-pay | Admitting: Cardiology

## 2008-07-01 ENCOUNTER — Encounter: Payer: Self-pay | Admitting: *Deleted

## 2008-07-01 ENCOUNTER — Ambulatory Visit: Payer: Self-pay | Admitting: Cardiology

## 2008-07-01 LAB — CONVERTED CEMR LAB
POC INR: 1.9
Protime: 17.1

## 2008-07-17 ENCOUNTER — Telehealth: Payer: Self-pay | Admitting: Pulmonary Disease

## 2008-07-29 ENCOUNTER — Ambulatory Visit: Payer: Self-pay | Admitting: Internal Medicine

## 2008-07-29 LAB — CONVERTED CEMR LAB: Prothrombin Time: 16.4 s

## 2008-08-06 ENCOUNTER — Encounter: Payer: Self-pay | Admitting: *Deleted

## 2008-08-11 ENCOUNTER — Ambulatory Visit: Payer: Self-pay | Admitting: Cardiology

## 2008-08-11 ENCOUNTER — Encounter: Payer: Self-pay | Admitting: Pulmonary Disease

## 2008-08-11 ENCOUNTER — Encounter (INDEPENDENT_AMBULATORY_CARE_PROVIDER_SITE_OTHER): Payer: Self-pay | Admitting: Cardiology

## 2008-08-11 LAB — CONVERTED CEMR LAB: Prothrombin Time: 17.8 s

## 2008-09-08 ENCOUNTER — Ambulatory Visit: Payer: Self-pay | Admitting: Cardiology

## 2008-09-08 ENCOUNTER — Encounter (INDEPENDENT_AMBULATORY_CARE_PROVIDER_SITE_OTHER): Payer: Self-pay | Admitting: Cardiology

## 2008-09-08 LAB — CONVERTED CEMR LAB: Prothrombin Time: 16.5 s

## 2008-09-15 ENCOUNTER — Ambulatory Visit: Payer: Self-pay | Admitting: Internal Medicine

## 2008-09-15 DIAGNOSIS — I255 Ischemic cardiomyopathy: Secondary | ICD-10-CM | POA: Insufficient documentation

## 2008-10-01 ENCOUNTER — Ambulatory Visit: Payer: Self-pay | Admitting: Internal Medicine

## 2008-10-22 ENCOUNTER — Ambulatory Visit: Payer: Self-pay | Admitting: Internal Medicine

## 2008-11-06 ENCOUNTER — Ambulatory Visit: Payer: Self-pay | Admitting: Pulmonary Disease

## 2008-11-06 DIAGNOSIS — E559 Vitamin D deficiency, unspecified: Secondary | ICD-10-CM

## 2008-11-06 DIAGNOSIS — E538 Deficiency of other specified B group vitamins: Secondary | ICD-10-CM | POA: Insufficient documentation

## 2008-11-06 HISTORY — DX: Vitamin D deficiency, unspecified: E55.9

## 2008-11-07 LAB — CONVERTED CEMR LAB
ALT: 55 units/L — ABNORMAL HIGH (ref 0–53)
Alkaline Phosphatase: 81 units/L (ref 39–117)
Basophils Relative: 0.9 % (ref 0.0–3.0)
Bilirubin, Direct: 0.3 mg/dL (ref 0.0–0.3)
Calcium: 9.7 mg/dL (ref 8.4–10.5)
Creatinine, Ser: 0.9 mg/dL (ref 0.4–1.5)
Eosinophils Absolute: 0.1 10*3/uL (ref 0.0–0.7)
Eosinophils Relative: 1.3 % (ref 0.0–5.0)
Folate: >20 ng/mL
GFR calc non Af Amer: 87.33 mL/min (ref >60)
HDL: 36.3 mg/dL — ABNORMAL LOW (ref >39.00)
LDL Cholesterol: 47 mg/dL (ref 0–99)
Lymphocytes Relative: 32.5 % (ref 12.0–46.0)
Neutrophils Relative %: 56.7 % (ref 43.0–77.0)
Pro B Natriuretic peptide (BNP): 55 pg/mL (ref 0.0–100.0)
RBC: 4.68 M/uL (ref 4.22–5.81)
Saturation Ratios: 33.1 % (ref 20.0–50.0)
Sed Rate: 6 mm/hr (ref 0–22)
Total Bilirubin: 2.8 mg/dL — ABNORMAL HIGH (ref 0.3–1.2)
Total CHOL/HDL Ratio: 3
Total Protein: 6.5 g/dL (ref 6.0–8.3)
Triglycerides: 139 mg/dL (ref 0.0–149.0)
VLDL: 27.8 mg/dL (ref 0.0–40.0)
WBC: 7.3 10*3/uL (ref 4.5–10.5)

## 2008-11-12 ENCOUNTER — Ambulatory Visit: Payer: Self-pay | Admitting: Internal Medicine

## 2008-11-14 ENCOUNTER — Encounter: Payer: Self-pay | Admitting: Pulmonary Disease

## 2008-11-14 ENCOUNTER — Telehealth (INDEPENDENT_AMBULATORY_CARE_PROVIDER_SITE_OTHER): Payer: Self-pay | Admitting: *Deleted

## 2008-11-20 ENCOUNTER — Encounter: Payer: Self-pay | Admitting: Pulmonary Disease

## 2008-11-20 ENCOUNTER — Telehealth: Payer: Self-pay | Admitting: Cardiology

## 2008-11-28 ENCOUNTER — Ambulatory Visit: Payer: Self-pay | Admitting: Internal Medicine

## 2008-12-01 ENCOUNTER — Encounter: Payer: Self-pay | Admitting: Cardiology

## 2008-12-02 ENCOUNTER — Ambulatory Visit: Payer: Self-pay | Admitting: Pulmonary Disease

## 2008-12-02 ENCOUNTER — Ambulatory Visit: Payer: Self-pay | Admitting: Cardiology

## 2008-12-02 DIAGNOSIS — J329 Chronic sinusitis, unspecified: Secondary | ICD-10-CM | POA: Insufficient documentation

## 2008-12-04 ENCOUNTER — Ambulatory Visit: Payer: Self-pay | Admitting: Internal Medicine

## 2008-12-04 LAB — CONVERTED CEMR LAB: POC INR: 1.1

## 2008-12-05 ENCOUNTER — Encounter: Payer: Self-pay | Admitting: Pulmonary Disease

## 2008-12-09 ENCOUNTER — Ambulatory Visit: Payer: Self-pay | Admitting: Cardiology

## 2008-12-15 ENCOUNTER — Ambulatory Visit: Payer: Self-pay | Admitting: Cardiology

## 2008-12-18 ENCOUNTER — Ambulatory Visit: Payer: Self-pay | Admitting: Internal Medicine

## 2008-12-24 ENCOUNTER — Ambulatory Visit: Payer: Self-pay | Admitting: Internal Medicine

## 2009-01-06 ENCOUNTER — Telehealth: Payer: Self-pay | Admitting: Internal Medicine

## 2009-01-07 ENCOUNTER — Encounter (INDEPENDENT_AMBULATORY_CARE_PROVIDER_SITE_OTHER): Payer: Self-pay | Admitting: Cardiology

## 2009-01-07 ENCOUNTER — Ambulatory Visit: Payer: Self-pay | Admitting: Cardiology

## 2009-01-07 LAB — CONVERTED CEMR LAB: POC INR: 2.6

## 2009-01-28 ENCOUNTER — Ambulatory Visit: Payer: Self-pay | Admitting: Cardiology

## 2009-02-13 ENCOUNTER — Telehealth: Payer: Self-pay | Admitting: Cardiology

## 2009-02-19 ENCOUNTER — Telehealth: Payer: Self-pay | Admitting: Cardiology

## 2009-02-23 ENCOUNTER — Telehealth: Payer: Self-pay | Admitting: Cardiology

## 2009-02-25 ENCOUNTER — Ambulatory Visit: Payer: Self-pay | Admitting: Cardiology

## 2009-02-25 LAB — CONVERTED CEMR LAB: POC INR: 2.5

## 2009-03-10 ENCOUNTER — Ambulatory Visit: Payer: Self-pay | Admitting: Internal Medicine

## 2009-03-26 ENCOUNTER — Telehealth (INDEPENDENT_AMBULATORY_CARE_PROVIDER_SITE_OTHER): Payer: Self-pay | Admitting: *Deleted

## 2009-03-26 ENCOUNTER — Ambulatory Visit: Payer: Self-pay | Admitting: Adult Health

## 2009-03-30 DIAGNOSIS — J209 Acute bronchitis, unspecified: Secondary | ICD-10-CM | POA: Insufficient documentation

## 2009-03-31 ENCOUNTER — Ambulatory Visit: Payer: Self-pay | Admitting: Pulmonary Disease

## 2009-03-31 ENCOUNTER — Telehealth (INDEPENDENT_AMBULATORY_CARE_PROVIDER_SITE_OTHER): Payer: Self-pay | Admitting: *Deleted

## 2009-03-31 DIAGNOSIS — N401 Enlarged prostate with lower urinary tract symptoms: Secondary | ICD-10-CM

## 2009-03-31 DIAGNOSIS — N138 Other obstructive and reflux uropathy: Secondary | ICD-10-CM | POA: Insufficient documentation

## 2009-04-01 ENCOUNTER — Ambulatory Visit: Payer: Self-pay | Admitting: Cardiology

## 2009-04-01 LAB — CONVERTED CEMR LAB
AST: 65 units/L — ABNORMAL HIGH (ref 0–37)
Albumin: 3.2 g/dL — ABNORMAL LOW (ref 3.5–5.2)
Alkaline Phosphatase: 59 units/L (ref 39–117)
Basophils Relative: 0.5 % (ref 0.0–3.0)
Bilirubin, Direct: 0.5 mg/dL — ABNORMAL HIGH (ref 0.0–0.3)
Calcium: 9.4 mg/dL (ref 8.4–10.5)
Eosinophils Relative: 1 % (ref 0.0–5.0)
GFR calc non Af Amer: 77.25 mL/min (ref >60)
Glucose, Bld: 106 mg/dL — ABNORMAL HIGH (ref 70–99)
HCT: 42.2 % (ref 39.0–52.0)
Hemoglobin: 14.1 g/dL (ref 13.0–17.0)
INR: 3.3 — ABNORMAL HIGH (ref 0.8–1.0)
Lymphocytes Relative: 23.7 % (ref 12.0–46.0)
MCV: 101.8 fL — ABNORMAL HIGH (ref 78.0–100.0)
Monocytes Relative: 14.5 % — ABNORMAL HIGH (ref 3.0–12.0)
Neutrophils Relative %: 60.3 % (ref 43.0–77.0)
POC INR: 3.3
Potassium: 4.8 meq/L (ref 3.5–5.1)
Prothrombin Time: 33.5 s — ABNORMAL HIGH (ref 9.1–11.7)
Prothrombin Time: 33.5 s
Sed Rate: 51 mm/hr — ABNORMAL HIGH (ref 0–22)
Sodium: 138 meq/L (ref 135–145)
Total Bilirubin: 1.6 mg/dL — ABNORMAL HIGH (ref 0.3–1.2)

## 2009-04-08 ENCOUNTER — Ambulatory Visit: Payer: Self-pay | Admitting: Cardiovascular Disease

## 2009-04-22 ENCOUNTER — Ambulatory Visit: Payer: Self-pay | Admitting: Internal Medicine

## 2009-04-27 ENCOUNTER — Ambulatory Visit: Payer: Self-pay | Admitting: Pulmonary Disease

## 2009-05-19 ENCOUNTER — Ambulatory Visit: Payer: Self-pay | Admitting: Cardiovascular Disease

## 2009-05-19 LAB — CONVERTED CEMR LAB: POC INR: 2.8

## 2009-06-08 ENCOUNTER — Ambulatory Visit: Payer: Self-pay | Admitting: Cardiology

## 2009-06-11 ENCOUNTER — Encounter (INDEPENDENT_AMBULATORY_CARE_PROVIDER_SITE_OTHER): Payer: Self-pay | Admitting: *Deleted

## 2009-06-16 ENCOUNTER — Ambulatory Visit: Payer: Self-pay | Admitting: Internal Medicine

## 2009-06-16 LAB — CONVERTED CEMR LAB: POC INR: 2.5

## 2009-06-30 ENCOUNTER — Telehealth: Payer: Self-pay | Admitting: Pulmonary Disease

## 2009-07-16 ENCOUNTER — Encounter: Payer: Self-pay | Admitting: Cardiology

## 2009-07-17 ENCOUNTER — Encounter: Payer: Self-pay | Admitting: Internal Medicine

## 2009-07-17 ENCOUNTER — Ambulatory Visit: Payer: Self-pay | Admitting: Cardiology

## 2009-07-21 ENCOUNTER — Ambulatory Visit: Payer: Self-pay | Admitting: Pulmonary Disease

## 2009-07-24 ENCOUNTER — Ambulatory Visit: Payer: Self-pay | Admitting: Cardiology

## 2009-07-27 LAB — CONVERTED CEMR LAB
BUN: 14 mg/dL (ref 6–23)
Chloride: 107 meq/L (ref 96–112)
Glucose, Bld: 134 mg/dL — ABNORMAL HIGH (ref 70–99)
Potassium: 3.9 meq/L (ref 3.5–5.1)

## 2009-07-31 ENCOUNTER — Telehealth: Payer: Self-pay | Admitting: Cardiology

## 2009-08-13 ENCOUNTER — Ambulatory Visit: Payer: Self-pay | Admitting: Cardiology

## 2009-08-13 ENCOUNTER — Ambulatory Visit: Payer: Self-pay | Admitting: Internal Medicine

## 2009-08-13 DIAGNOSIS — E8779 Other fluid overload: Secondary | ICD-10-CM | POA: Insufficient documentation

## 2009-08-13 LAB — CONVERTED CEMR LAB: POC INR: 1.7

## 2009-08-18 LAB — CONVERTED CEMR LAB
CO2: 30 meq/L (ref 19–32)
Chloride: 104 meq/L (ref 96–112)
Creatinine, Ser: 1 mg/dL (ref 0.4–1.5)

## 2009-08-27 ENCOUNTER — Ambulatory Visit: Payer: Self-pay | Admitting: Internal Medicine

## 2009-09-17 ENCOUNTER — Ambulatory Visit: Payer: Self-pay | Admitting: Cardiovascular Disease

## 2009-09-17 LAB — CONVERTED CEMR LAB: POC INR: 2

## 2009-09-22 ENCOUNTER — Ambulatory Visit: Payer: Self-pay | Admitting: Cardiology

## 2009-09-22 ENCOUNTER — Ambulatory Visit: Payer: Self-pay | Admitting: Internal Medicine

## 2009-09-23 LAB — CONVERTED CEMR LAB
CO2: 32 meq/L (ref 19–32)
Chloride: 102 meq/L (ref 96–112)
Creatinine, Ser: 0.9 mg/dL (ref 0.4–1.5)
Sodium: 143 meq/L (ref 135–145)

## 2009-10-07 ENCOUNTER — Ambulatory Visit: Payer: Self-pay

## 2009-10-07 ENCOUNTER — Ambulatory Visit: Payer: Self-pay | Admitting: Cardiovascular Disease

## 2009-10-22 ENCOUNTER — Encounter: Payer: Self-pay | Admitting: Internal Medicine

## 2009-11-04 ENCOUNTER — Ambulatory Visit: Payer: Self-pay | Admitting: Cardiovascular Disease

## 2009-11-04 LAB — CONVERTED CEMR LAB: POC INR: 2.7

## 2009-11-10 ENCOUNTER — Ambulatory Visit: Payer: Self-pay | Admitting: Internal Medicine

## 2009-11-11 ENCOUNTER — Encounter: Payer: Self-pay | Admitting: Internal Medicine

## 2009-11-18 ENCOUNTER — Ambulatory Visit: Payer: Self-pay | Admitting: Pulmonary Disease

## 2009-11-19 ENCOUNTER — Telehealth (INDEPENDENT_AMBULATORY_CARE_PROVIDER_SITE_OTHER): Payer: Self-pay | Admitting: *Deleted

## 2009-11-22 LAB — CONVERTED CEMR LAB
ALT: 39 units/L (ref 0–53)
AST: 37 units/L (ref 0–37)
Alkaline Phosphatase: 72 units/L (ref 39–117)
Basophils Absolute: 0 10*3/uL (ref 0.0–0.1)
Bilirubin, Direct: 0.3 mg/dL (ref 0.0–0.3)
CO2: 32 meq/L (ref 19–32)
Chloride: 104 meq/L (ref 96–112)
Eosinophils Absolute: 0.1 10*3/uL (ref 0.0–0.7)
Folate: >20 ng/mL
HDL: 35.4 mg/dL — ABNORMAL LOW (ref >39.00)
Lymphocytes Relative: 26.7 % (ref 12.0–46.0)
MCHC: 34.2 g/dL (ref 30.0–36.0)
Monocytes Absolute: 0.7 10*3/uL (ref 0.1–1.0)
Neutrophils Relative %: 57.2 % (ref 43.0–77.0)
Potassium: 5 meq/L (ref 3.5–5.1)
Pro B Natriuretic peptide (BNP): 218.9 pg/mL — ABNORMAL HIGH (ref 0.0–100.0)
RDW: 18.8 % — ABNORMAL HIGH (ref 11.5–14.6)
Sed Rate: 9 mm/hr (ref 0–22)
Sodium: 140 meq/L (ref 135–145)
TSH: 2.06 microintl units/mL (ref 0.35–5.50)
Total Protein: 6 g/dL (ref 6.0–8.3)
Triglycerides: 102 mg/dL (ref 0.0–149.0)

## 2009-11-23 ENCOUNTER — Ambulatory Visit: Payer: Self-pay | Admitting: Internal Medicine

## 2009-11-23 ENCOUNTER — Encounter: Payer: Self-pay | Admitting: Internal Medicine

## 2009-11-23 ENCOUNTER — Ambulatory Visit: Payer: Self-pay

## 2009-11-23 ENCOUNTER — Encounter (HOSPITAL_COMMUNITY)
Admission: RE | Admit: 2009-11-23 | Discharge: 2010-01-30 | Payer: Self-pay | Source: Home / Self Care | Attending: Internal Medicine | Admitting: Internal Medicine

## 2009-11-23 LAB — CONVERTED CEMR LAB
Basophils Relative: 0.7 % (ref 0.0–3.0)
Chloride: 103 meq/L (ref 96–112)
Creatinine, Ser: 0.8 mg/dL (ref 0.4–1.5)
Eosinophils Relative: 1.7 % (ref 0.0–5.0)
GFR calc non Af Amer: 101.22 mL/min (ref >60)
INR: 1.8 — ABNORMAL HIGH (ref 0.8–1.0)
Lymphocytes Relative: 38.8 % (ref 12.0–46.0)
MCV: 98.1 fL (ref 78.0–100.0)
Monocytes Absolute: 0.4 10*3/uL (ref 0.1–1.0)
Monocytes Relative: 9.9 % (ref 3.0–12.0)
Neutrophils Relative %: 48.9 % (ref 43.0–77.0)
Platelets: 97 10*3/uL — ABNORMAL LOW (ref 150.0–400.0)
Potassium: 4.2 meq/L (ref 3.5–5.1)
Prothrombin Time: 19.4 s — ABNORMAL HIGH (ref 9.7–11.8)
RBC: 4.17 M/uL — ABNORMAL LOW (ref 4.22–5.81)
WBC: 3.9 10*3/uL — ABNORMAL LOW (ref 4.5–10.5)
aPTT: 49.2 s — ABNORMAL HIGH (ref 21.7–28.8)

## 2009-11-27 ENCOUNTER — Ambulatory Visit (HOSPITAL_COMMUNITY): Admission: RE | Admit: 2009-11-27 | Discharge: 2009-11-27 | Payer: Self-pay | Admitting: Internal Medicine

## 2009-11-27 ENCOUNTER — Ambulatory Visit: Payer: Self-pay | Admitting: Internal Medicine

## 2009-12-02 ENCOUNTER — Encounter: Payer: Self-pay | Admitting: Internal Medicine

## 2009-12-02 ENCOUNTER — Ambulatory Visit: Payer: Self-pay | Admitting: Cardiology

## 2009-12-02 LAB — CONVERTED CEMR LAB: POC INR: 1.4

## 2009-12-09 DIAGNOSIS — Z95810 Presence of automatic (implantable) cardiac defibrillator: Secondary | ICD-10-CM

## 2009-12-09 HISTORY — DX: Presence of automatic (implantable) cardiac defibrillator: Z95.810

## 2009-12-10 ENCOUNTER — Ambulatory Visit (HOSPITAL_COMMUNITY): Admission: RE | Admit: 2009-12-10 | Discharge: 2009-12-10 | Payer: Self-pay | Admitting: Internal Medicine

## 2009-12-10 ENCOUNTER — Ambulatory Visit: Payer: Self-pay | Admitting: Internal Medicine

## 2009-12-10 ENCOUNTER — Encounter: Payer: Self-pay | Admitting: Internal Medicine

## 2009-12-10 ENCOUNTER — Ambulatory Visit: Payer: Self-pay | Admitting: Cardiology

## 2009-12-10 ENCOUNTER — Ambulatory Visit: Payer: Self-pay

## 2009-12-10 LAB — CONVERTED CEMR LAB: POC INR: 1.7

## 2009-12-17 ENCOUNTER — Encounter: Payer: Self-pay | Admitting: Pulmonary Disease

## 2009-12-18 ENCOUNTER — Ambulatory Visit: Payer: Self-pay | Admitting: Cardiology

## 2009-12-18 LAB — CONVERTED CEMR LAB: POC INR: 1.7

## 2009-12-28 ENCOUNTER — Ambulatory Visit: Payer: Self-pay | Admitting: Cardiology

## 2010-01-07 ENCOUNTER — Ambulatory Visit: Payer: Self-pay | Admitting: Internal Medicine

## 2010-01-07 LAB — CONVERTED CEMR LAB
BUN: 13 mg/dL (ref 6–23)
Calcium: 9.9 mg/dL (ref 8.4–10.5)
Creatinine, Ser: 1 mg/dL (ref 0.4–1.5)
GFR calc non Af Amer: 80.81 mL/min (ref >60.00)

## 2010-01-11 ENCOUNTER — Ambulatory Visit: Payer: Self-pay | Admitting: Cardiology

## 2010-02-08 ENCOUNTER — Ambulatory Visit: Admission: RE | Admit: 2010-02-08 | Discharge: 2010-02-08 | Payer: Self-pay | Source: Home / Self Care

## 2010-02-08 LAB — CONVERTED CEMR LAB: POC INR: 1.9

## 2010-02-16 ENCOUNTER — Telehealth: Payer: Self-pay | Admitting: Internal Medicine

## 2010-02-25 ENCOUNTER — Encounter: Payer: Self-pay | Admitting: Internal Medicine

## 2010-03-02 ENCOUNTER — Encounter: Payer: Self-pay | Admitting: Cardiology

## 2010-03-02 NOTE — Cardiovascular Report (Signed)
Summary: Office Visit   Office Visit   Imported By: Sallee Provencal 08/11/2009 12:08:38  _____________________________________________________________________  External Attachment:    Type:   Image     Comment:   External Document

## 2010-03-02 NOTE — Letter (Signed)
Summary: Implantable Device Instructions  Press photographer, Kathleen  [address] [place]   [place], [place] [address]   Phone: [phone]  Fax: [phone]      Implantable Device Instructions  You are scheduled for:  _____ Permanent Transvenous Pacemaker _____ Implantable Cardioverter Defibrillator _____ Implantable Loop Recorder ___x__ Generator Change  on 11/27/09 7:45 am with Dr. Caryl Comes.  1.  Please arrive at the [place] [facility] at 5:45 am on the day of your procedure.  2.  Do not eat or drink after midnight  3.  Complete lab work on 11/20/09 11:00 am.  The lab at [facility] is open from 8:30 AM to 1:30 PM and from 2:30 PM to 5:00 PM.  The lab at [facility] is open from 7:30 AM to 5:30 PM.  You do not have to be fasting.  4.  Do NOT take Coumadin 3 days prior to your procedure. Hold Lasix and Coumadin the morning of your procedure.   5.  Plan for an overnight stay.  Bring your insurance cards and a list of your medications.  6.  Wash your chest and neck with antibacterial soap (any brand) the evening before and the morning of your procedure.  Rinse well.   *If you have ANY questions after you get home, please call the office [phone]. Suanne Marker, RN  *Every attempt is made to prevent procedures from being rescheduled.  Due to the nauture of Electrophysiology, rescheduling can happen.  The physician is always aware and directs the staff when this occurs.

## 2010-03-02 NOTE — Assessment & Plan Note (Signed)
Summary: defib check.gdt/amber      Allergies Added: NKDA  Primary Provider:  Teressa Lower, MD   History of Present Illness: [name] is seen in followup for an ICD implanted for primary prevention in this setting of ischemic coronary disease. As part of the MADIT-CRT trial.  His last catheterization in 2006 demonstrated a total right and 80% marginal. Vein graft to the diagonal was occluded and other bypasses were open ejection fraction 2007 was about 35%   The patient denies SOB, chest pain, edema or palpitations   Current Medications (verified): 1)  Bayer Aspirin Ec Low Dose 81 Mg  Tbec (Aspirin) .... Take 1 Tablet By Mouth Once A Day 2)  Carvedilol 25 Mg Tabs (Carvedilol) .... Take One Tablet By Mouth Twice A Day 3)  Lisinopril 20 Mg Tabs (Lisinopril) .... Take One Tablet By Mouth Daily 4)  Lasix 40 Mg  Tabs (Furosemide) .... Take 1 Tablet By Mouth Once A Day 5)  Klor-Con 20 Meq  Pack (Potassium Chloride) .... Take 1 Tablet By Mouth Once A Day 6)  Simvastatin 20 Mg Tabs (Simvastatin) .... Take One Tablet By Mouth Daily At Bedtime 7)  Mercaptopurine 50 Mg  Tabs (Mercaptopurine) .... Take 1.5 Tabs By Mouth Once Daily As Directed By Drchowdhury... 8)  Vitamin B-12 1000 Mcg Tabs (Cyanocobalamin) .... Take One Tablet By Mouth Once Daily. 9)  Folic Acid 1 Mg  Tabs (Folic Acid) .... Take 1 Tablet By Mouth Once A Day 10)  Vitamin D 1000 Unit Tabs (Cholecalciferol) .... Take 1-2 Tablets By Mouth Once Daily 11)  Clonazepam 0.5 Mg Tabs (Clonazepam) .... Take 1 Tablet By Mouth Daily 12)  Coumadin 5 Mg Tabs (Warfarin Sodium) .... Use As Directed By Anticoagulation Clinic. 13)  Zegerid 40-1100 Mg Caps (Omeprazole-Sodium Bicarbonate) .... As Needed  Allergies (verified): No Known Drug Allergies  Past History:  Past Medical History: Last updated: 12/02/2008 HYPERTENSION (ICD-401.9) CARDIOMYOPATHY, ISCHEMIC (ICD-414.8) CAD CABG 1994 CHF.... chronic systolic  EF 88%... echo... November,  2009 Bradycardia.... 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) LBBB (ICD-426.3) HYPERLIPIDEMIA (ICD-272.4) GERD (ICD-530.81) COLONIC POLYPS (ICD-211.3) CROHN'S DISEASE (ICD-555.9) Hx of ACUTE VASCULAR INSUFFICIENCY OF INTESTINE (ICD-557.0) VENTRAL HERNIA (ICD-553.20) DEGENERATIVE JOINT DISEASE (ICD-715.90) Hx of POLYMYALGIA RHEUMATICA (ICD-725) TREMOR (ICD-781.0) ANXIETY (ICD-300.00) Hx of SHINGLES (ICD-053.9) VITAMIN B12 DEFICIENCY (ICD-266.2) VITAMIN D DEFICIENCY (ICD-268.9)  Past Surgical History: Last updated: 12/02/2008 S/P CABG in 1994 S/P laparoscopic cholecystectomy in 2002 S/P ELap w/ superior mesenteric artery embolectomy by [facility] 1/03 S/P repair of incarcerated ventral hernia & lysis of adhesions 11/03 by DrHIngram S/P implantation of biventric cardioverter-defibrillator by DrTaylor in 8/06-[employer] Contak H170 S/P left inguinal hernia repair w/ mesh 6/08 by Joaquin Bend  Family History: Last updated: 11/06/2008 Coronary Heart Disease Hypertension Hyperlipidemia Sister had tremor...  Social History: Last updated: 11/06/2008 Married-3 children Tobacco Use - No.  Social alcohol Employ- bulldozer driver  Vital Signs:  Patient profile:   77 year old male Height:      72 inches Weight:      188 pounds BMI:     25.59 Pulse rate:   65 / minute Pulse rhythm:   regular BP sitting:   109 / 70  (right arm) Cuff size:   large  Vitals Entered By: Lynden Ang (March 10, 2009 11:08 AM)  Physical Exam  General:  The patient was alert and oriented in no acute distress. HEENT Normal.  Neck veins were flat, carotids were brisk.  Lungs were clear.  Heart sounds were  regular without murmurs or gallops.  Abdomen was soft with active bowel sounds. There is no clubbing cyanosis or edema. Skin Warm and dry     ICD Specifications Following MD:  Virl Axe, MD     ICD Vendor:  [employer]     ICD Model Number:  X793      ICD Serial Number:  [id number] ICD DOI:  09/07/2004     ICD Implanting MD:  Cristopher Peru, MD  Lead 1:    Location: RA     DOI: 09/07/2004     Model #: [id number]     Serial #: [id number]     Status: active Lead 2:    Location: RV     DOI: 09/07/2004     Model #: [id number]     Serial #: [id number]     Status: active Lead 3:    Location: LV     DOI: 10/11/2004     Model #: [id number]     Serial #: [id number]     Status: active  Indications::  ICM, CHF, MADIT-CRT   ICD Follow Up Remote Check?  No Battery Voltage:  2.56 V     Charge Time:  9.0 seconds     Battery Est. Longevity:  MOL2 Underlying rhythm:  Brady ICD Dependent:  No       ICD Device Measurements Atrium:  Amplitude: 6.5 mV, Impedance: 566 ohms, Threshold: 0.8 V at 0.5 msec Right Ventricle:  Amplitude: 23.1 mV, Impedance: 767 ohms, Threshold: 0.6 V at 0.5 msec Left Ventricle:  Amplitude: 14.6 mV, Impedance: 429 ohms, Threshold: 1.0 V at 0.8 msec Shock Impedance: 46 ohms   Episodes MS Episodes:  0     Percent Mode Switch:  0     Coumadin:  Yes Shock:  0     ATP:  0     Nonsustained:  0     Atrial Pacing:  0%     Ventricular Pacing:  99%  Brady Parameters Mode DDD     Lower Rate Limit:  60     Upper Rate Limit 140 PAV 60      Tachy Zones VF:  210     VT:  180     Next Remote Date:  06/11/2009     Next Cardiology Appt Due:  03/03/2010 Tech Comments:  Base rate increased to 60.  Latitude transmissions every 3 months.  ROV 1 year Dr. Caryl Comes. Alma Friendly, LPN  March 11, 1155 11:43 AM   Impression & Recommendations:  Problem # 1:  IMPLANTABLE DEFIBRILLATOR, GDT CONTAK H170-MADIT-CRT (ICD-V45.02) Device parameters and data were reviewed and no changes were made apart from changing his lower rate from 40 beats per minute to 60 beats per minute  Problem # 2:  CARDIOMYOPATHY, ISCHEMIC (ICD-414.8) his stable on his current medications.  he has a history of mesenteric embolism and I suspect this is the reason that he is on Coumadin plus aspirin. I will defer  to Dr. [name] whether he needs the aspirin in addition.  His updated medication list for this problem includes:    Bayer Aspirin Ec Low Dose 81 Mg Tbec (Aspirin) .[name]... Take 1 tablet by mouth once a day    Carvedilol 25 Mg Tabs (Carvedilol) .[name]... Take one tablet by mouth twice a day    Lisinopril 20 Mg Tabs (Lisinopril) .[name]... Take one tablet by mouth daily    Lasix 40 Mg Tabs (Furosemide) .[name]... Take 1 tablet by mouth once a day  Coumadin 5 Mg Tabs (Warfarin sodium) ..... Use as directed by anticoagulation clinic.  Problem # 3:  CHRONIC SYSTOLIC HEART FAILURE (YNW-295.62) stable on his current medications. His updated medication list for this problem includes:    Bayer Aspirin Ec Low Dose 81 Mg Tbec (Aspirin) .[name]... Take 1 tablet by mouth once a day    Carvedilol 25 Mg Tabs (Carvedilol) .[name]... Take one tablet by mouth twice a day    Lisinopril 20 Mg Tabs (Lisinopril) .[name]... Take one tablet by mouth daily    Lasix 40 Mg Tabs (Furosemide) .[name]... Take 1 tablet by mouth once a day    Coumadin 5 Mg Tabs (Warfarin sodium) ..... Use as directed by anticoagulation clinic.  Problem # 4:  SINUS BRADYCARDIA (ICD-427.81) we will reprogram his lower rate to a base rate of 60. Hopefully this will have some improvement on his performance His updated medication list for this problem includes:    Bayer Aspirin Ec Low Dose 81 Mg Tbec (Aspirin) .[name]... Take 1 tablet by mouth once a day    Carvedilol 25 Mg Tabs (Carvedilol) .[name]... Take one tablet by mouth twice a day    Lisinopril 20 Mg Tabs (Lisinopril) .[name]... Take one tablet by mouth daily    Coumadin 5 Mg Tabs (Warfarin sodium) ..... Use as directed by anticoagulation clinic.  Patient Instructions: 1)  Your physician recommends that you schedule a follow-up appointment in: 3 MONTHS WITH DR. KATZ. CANCEL THE DEVICE CLINIC APPT IN MAY.

## 2010-03-02 NOTE — Cardiovascular Report (Signed)
Summary: Office Visit   Office Visit   Imported By: Sallee Provencal 11/12/2009 11:56:07  _____________________________________________________________________  External Attachment:    Type:   Image     Comment:   External Document

## 2010-03-02 NOTE — Assessment & Plan Note (Signed)
Summary: rov 4 months///kp   Primary Care Provider:  Teressa Lower, MD  CC:  4 month ROV & review of mult medical problems....  History of Present Illness: [name] here for a follow up visit... he has multiple medical problems as noted below...     ~  [place]Chowdhury [place] w/ sm bowel Crohn's- s/p mult resections w/ complications, stable on 6-MP for prevention of recurrence... also has sl incr fasting bili c/w Gilbert's...  ~  followed by [name] for Cards- w/ CAD, s/p CABG, Cardiomyopathy w/ AICD- stable... f/u 2DEcho 11/09 showed diffuseHK w/ EF= 35% & DD, mod incr AoV thickness w/o AS... EKG= pacer rhythm.   ~  Feb10:  he had URI in Dec Rx w/ Augmentin by TP and improved... he complains of his nose running clear water when he is in & out of the cold air... his CC is intermittent "shaking" of his hands/ head/ etc... occas hard to eat because of the tremor but it is quite intermittent... **we discussed referral to neurology for further eval of this problem >> saw DrYan 3/10 w/ Dx essential tremor (+FamHx of same); trial Clonazepam 0.72m Bid...  labs showed B12= 215, & VitD= 23 >> started oral B12 & VitD supplements.  ~  Oct10:  he's had a good 8 months- saw DrKatz 5/10 & DrKlein 8/10: cardiomyopathy stable & compensated, ICD functioning well, but couldn't up titrate the Coreg due to bradycardia...  he also saw DrNesi 7/10 w/ BPH, obstructive symptoms, and ED- given  Flomax & "shots"... still followed regularly by GI- [place]Chowdhury [place] for [place]'s on 6MP... pt c/o some nausea and has f/u appt next week...   ~  Nov10:  add-on for sinusitis- c/o 1 wk hx URI "cold" taking OTC Mucinex, then yellow drainage, fever low grade & sweats, right maxillary pain & congestion... exam w/ max tenderness & XRay w/ mucosal thickening & AF level on right- discussed Rx w/ Augmentin, Depo, Dosepak >> improved...   ~  Mar11:  he had persistant URI symptoms- sinusitis, bronchitis> required Augmentin,  Depo/ Pred, Mucinex, etc & finally improved.  ~  July 21, 2009:  [facility] some incr edema & SPIRONOLACTONE 12.528md started & KCl stopped... he notes dizziness improved, still w/ mult somatic complaints & we discussed holding tight for now.   ~  November 18, 2009:  he has maintained regular f/u w/ Cards- DrBenay Spice/11 & DrKlein 10/11- pacer generator near EOL and pt noted decr energy "I'm slowing down, harder to get going";  they plan Myoview 1st, then device generator replacement... he is due for a follow up appt w/ DrChowdury as well... due for Flu shot today & f/u fasting labs (see below)...   Current Problems:   HBP, CAD, Cardiomyopathy - followed by DrBenay Spicend DrKlein... he had CABG in 1994 (with cath in 96 revealing a total right and an 80% OM, vein graft to the diagonal was occluded, other bypasses were patent), Biv AICD in 2006... currently taking COUMADIN (follow in clinic), ASA 8115m,  COREG 12.5mg70m,  LISINOPRIL 20mg49m LASIX 40mg/65mSPIRONOLACTONE 25mg/d74mdoing reasonably well on Rx- last Cardiolite 3/03 w/ EF=37%... his last cath was 6/06 w/ native vessel and graft disease... last 2DEcho 11/09 stable w/ mod LVD- diffuse HK w/ EF= 35%, DD, mod incr AoV thickness w/o AS...   ~  11/10: saw DrKatz for f/u & stable- same meds, no changes made.  ~  2/11:  saw DrKlein for f/u AICD- stable, no chnages made.  ~  3/11:  CXR showed stable CABG, AICD, & chr changes...  ~  10/11:  Myoview= pending...  HYPERLIPIDEMIA - he takes SIMVASTATIN 59m/d... he's had min, non-progressive incr LFTs noted.  ~  FSum[place]4/08 showed TChol 138, TG 138, HDL 38, LDL 72... tolerating med well.  ~  FM[place]4/09 showed TChol 118, TG 111, HDL 33, LDL 63  ~  FLP 12/09 showed TChol 118, TG 100, HDL 31, LDL 67  ~  FLP 10/10 showed TChol 111, TG 139, HDL 36, LDL 47  ~  FLP 10/11 showed TChol 101, TG 102, HDL 35, LDL 45  GERD (ICD-530.81) - no active symptoms and not currently taking PPI etc...  COLONIC POLYPS  (ICD-211.3) - last colonoscopy here was 7/04 by DDemetra Shinerw/ divertics and several polyps from 6 - 263msize, reportedly adenomatous on bx but I cannot find the path reports... he is overdue for f/u colon...  ~  labs 2/10 showed Hg= 15.7...Marland KitchenMarland Kitchene will send copy of note to DrChowdhury w/ request for f/u colonoscopy.  ~  f/u colonoscopy 11/10 by DrChowghury showed norm TI & several polyps removed- path +tubular adenoma...  CROHN'S DISEASE (ICD-555.9) - he is [place] by DrChowdhury in [place] on 6-MP 5033mabs- 1.5 tabs/d... he notes intermittent diarrhea and was started on Questran Prn...  ~  labs 3/11 noted sl incr LFTs, Protime, & Sed...  ~  labs 10/11 showed norm chem & LFTs x incr bili, Plat=88K, BNP=219, otherw norm.  Hx of ACUTE VASCULAR INSUFFICIENCY OF INTESTINE (ICD-557.0) - he has embolectomy of the superior mesenteric artery 1/03 by DrLSheryn Bisonong hospitalization w/ complications)... he has remained on COUMADIN in the coumadin clinic ever since then...  VENTRAL HERNIA (ICD-553.20) - he is s/p left inguinal hernia repair 6/08 by DrIClarita Lebernd had a prev incarcerated ventral hernia repaired 11/03... his residual/ recurrent ventral hernia has been evaluated by DrIClarita Lebero feels that he would need eval [place] this hernia comes to surg... currently he wears an abd binder when up and about...  HYPERTROPHY PROSTATE W/UR OBST & OTH LUTS (ICD-600.01) - saw DrNesi 7/10 w/ BPH, obstructive symptoms, and ED- given  Flomax & "shots"...   DEGENERATIVE JOINT DISEASE (ICD-715.90)  Hx of POLYMYALGIA RHEUMATICA (ICD-725) - hasn't req steroids x yrs...  ~  labs 2/10 showed Sed= 8  ~  labs 10/10 showed Sed= 6  ~  labs 3/11 showed Sed= 51 w/ acute illness.  ~  labs 10/11 showed Sed= 9  TREMOR (ICD-781.0) - eval 3/10 by DrYDalphine HandinguilfordNeurology- essential tremor w/ +FamHx in sister... trial Clonazepam 0.66m32md w/ improvement...  ANXIETY (ICD-300.00) - he uses the same Clonazepam 0.66mg72m...  Hx  of SHINGLES (ICD-053.9) VITAMIN B12 DEFICIENCY (ICD-266.2) - on B 12 1000mcg48mTC...  ~  labs 2/10 showed B12 level = 215... rec start oral B12 supplement daily.  ~  labs 10/10 showed B12 level = 416... continue oral B12 supplement.  ~  labs 10/11 showed B12= 592... continue same.  VITAMIN D DEFICIENCY (ICD-268.9) - on Vit D 1000 u daily OTC...  ~  labs 2/10 showed Vit D level = 23... rec> start Vit D 1000 u daily..   Preventive Screening-Counseling & Management  Alcohol-Tobacco     Smoking Status: quit < 6 months     Packs/Day: 1.0     Year Quit: 1975     Pack years: 20-30  Allergies (verified): No Known  Drug Allergies  Comments:  Nurse/Medical Assistant: The patient's medications and allergies were reviewed with the patient and were updated in the Medication and Allergy Lists.  Past History:  Past Medical History: Positional dizziness.... may, 2011 HYPERTENSION (ICD-401.9) CARDIOMYOPATHY, ISCHEMIC (ICD-414.8) CAD CABG 1994 CHF.... chronic systolic  EF 13%... echo... November, 2009 Bradycardia.... 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) LBBB (ICD-426.3) HYPERLIPIDEMIA (ICD-272.4) GERD (ICD-530.81) COLONIC POLYPS (ICD-211.3) CROHN'S DISEASE (ICD-555.9) Hx of ACUTE VASCULAR INSUFFICIENCY OF INTESTINE (ICD-557.0) VENTRAL HERNIA (ICD-553.20) HYPERTROPHY PROSTATE W/UR OBST & OTH LUTS (ICD-600.01) DEGENERATIVE JOINT DISEASE (ICD-715.90) Hx of POLYMYALGIA RHEUMATICA (ICD-725) TREMOR (ICD-781.0) ANXIETY (ICD-300.00) Hx of SHINGLES (ICD-053.9) VITAMIN B12 DEFICIENCY (ICD-266.2) VITAMIN D DEFICIENCY (ICD-268.9)  Past Surgical History: S/P CABG in 1994 S/P laparoscopic cholecystectomy in 2002 S/P ELap w/ superior mesenteric artery embolectomy by [facility] 1/03 S/P repair of incarcerated ventral hernia & lysis of adhesions 11/03 by DrHIngram S/P implantation of biventric cardioverter-defibrillator by DrTaylor in 8/06-[employer]  H170 S/P left inguinal hernia repair w/ mesh 6/08 by Joaquin Bend  Family History: Reviewed history from 11/06/2008 and no changes required. Coronary Heart Disease Hypertension Hyperlipidemia Sister had tremor...  Social History: Reviewed history from 03/26/2009 and no changes required. Married-3 children former smoker, quit in 1975 x64yr 1ppd. Social alcohol Employ- bulldozer driver Packs/Day:  1.0  Review of Systems      See HPI       The patient complains of decreased hearing and dyspnea on exertion.  The patient denies anorexia, fever, weight loss, weight gain, vision loss, hoarseness, chest pain, syncope, peripheral edema, prolonged cough, headaches, hemoptysis, abdominal pain, melena, hematochezia, severe indigestion/heartburn, hematuria, incontinence, muscle weakness, suspicious skin lesions, transient blindness, difficulty walking, depression, unusual weight change, abnormal bleeding, enlarged lymph nodes, and angioedema.    Vital Signs:  Patient profile:   77year old male Height:      72 inches Weight:      203 pounds BMI:     27.63 O2 Sat:      95 % on Room air Temp:     97.5 degrees F oral Pulse rate:   44 / minute BP sitting:   110 / 80  (left arm) Cuff size:   regular  Vitals Entered By: LElita BooneCMA (November 18, 2009 10:30 AM)  O2 Sat at Rest %:  95 O2 Flow:  Room air CC: 4 month ROV & review of mult medical problems... Is Patient Diabetic? No Pain Assessment Patient in pain? no      Comments no changes in meds today   Physical Exam  Additional Exam:  WD, WN, 77y/o WM in NAD... GENERAL:  Alert & oriented; pleasant & cooperative... HEENT:  [place]/AT, EOM-wnl, PERRLA, EACs-clear, TMs-wnl, NOSE- sl red/ inflamm, THROAT-clear & wnl. NECK:  Supple w/ fairROM; no JVD; normal carotid impulses w/o bruits; no thyromegaly or nodules palpated; no lymphadenopathy. CHEST:  Decr BS w/ few scat rhonchi, no wheezing, no rales, no signs of consolidation. HEART:   Regular Rhythm; gr 1/6 SEM without rubs or gallops detected... ABDOMEN:  Soft & nontender; mult scars, & ventral hernia; normal bowel sounds; no organomegaly or masses palpated. EXT: without deformities, mod arthritic changes; no varicose veins/ venous insuffic/ or edema. NEURO:  CN's intact; I do not apprec a tremor at present; no focal neuro deficits... DERM:  No lesions noted; no rash etc...    MISC. Report  Procedure date:  11/18/2009  Findings:      BMP (METABOL)   Sodium  140 mEq/L                   135-145   Potassium                 5.0 mEq/L                   3.5-5.1   Chloride                  104 mEq/L                   96-112   Carbon Dioxide            32 mEq/L                    19-32   Glucose              [H]  108 mg/dL                   70-99   BUN                       12 mg/dL                    6-23   Creatinine                0.9 mg/dL                   0.4-1.5   Calcium                   9.4 mg/dL                   8.4-10.5   GFR                       89.37 mL/min                >60  Hepatic/Liver Function Panel (HEPATIC)   Total Bilirubin      [H]  3.0 mg/dL                   0.3-1.2   Direct Bilirubin          0.3 mg/dL                   0.0-0.3   Alkaline Phosphatase      72 U/L                      39-117   AST                       37 U/L                      0-37   ALT                       39 U/L                      0-53   Total Protein             6.0 g/dL                    6.0-8.3   Albumin  3.5 g/dL                    3.5-5.2  CBC Platelet w/Diff (CBCD)   White Cell Count          5.0 K/uL                    4.5-10.5   Red Cell Count       [L]  4.14 Mil/uL                 4.22-5.81   Hemoglobin                14.0 g/dL                   13.0-17.0   Hematocrit                40.9 %                      39.0-52.0   MCV                       99.0 fl                     78.0-100.0   Platelet Count       [L]  88.0  K/uL                   150.0-400.0   Neutrophil %              57.2 %                      43.0-77.0   Lymphocyte %              26.7 %                      12.0-46.0   Monocyte %           [H]  13.9 %                      3.0-12.0   Eosinophils%              1.9 %                       0.0-5.0   Basophils %               0.3 %                       0.0-3.0  Comments:      Lipid Panel (LIPID)   Cholesterol               101 mg/dL                   0-200   Triglycerides             102.0 mg/dL                 0.0-149.0   HDL                  [L]  35.40 mg/dL                 >39.00   LDL Cholesterol  45 mg/dL                    0-99  TSH (TSH)   FastTSH                   2.06 uIU/mL                 0.35-5.50   B12 + Folate Panel (B12/FOL)   Vitamin B12               592 pg/mL                   211-911   Folate                    >20.0 ng/mL   B-Type Natiuretic Peptide (BNPR)  B-Type Natriuetic Peptide                        [H]  218.9 pg/mL                 0.0-100.0   Sed Rate (ESR)   Sed Rate                  9 mm/hr                     0-22   Impression & Recommendations:  Problem # 1:  HYPERTENSION (ICD-401.9) Controlled>  same meds. His updated medication list for this problem includes:    Carvedilol 25 Mg Tabs (Carvedilol) .[name]... Take one tablet by mouth twice a day    Lisinopril 10 Mg Tabs (Lisinopril) .[name]... Take one tablet by mouth daily    Lasix 40 Mg Tabs (Furosemide) .[name]... Take 1 tablet by mouth once a day    Spironolactone 25 Mg Tabs (Spironolactone) .[name]... Take 1 tablet by mouth daily  Orders: TLB-BMP (Basic Metabolic Panel-BMET) (26834-HDQQIWL) TLB-Hepatic/Liver Function Pnl (80076-HEPATIC) TLB-CBC Platelet - w/Differential (85025-CBCD) TLB-Lipid Panel (80061-LIPID) TLB-TSH (Thyroid Stimulating Hormone) (84443-TSH) TLB-B12 + Folate Pnl (79892_11941-D40/CXK) TLB-BNP (B-Natriuretic Peptide) (83880-BNPR) TLB-Sedimentation Rate (ESR) (85652-ESR)  Problem  # 2:  CARDIOMYOPATHY, ISCHEMIC (ICD-414.8) Followed by [name] & DrKlein w/ Myoview pending... His updated medication list for this problem includes:    Bayer Aspirin Ec Low Dose 81 Mg Tbec (Aspirin) .[name]... Take 1 tablet by mouth once a day    Carvedilol 25 Mg Tabs (Carvedilol) .[name]... Take one tablet by mouth twice a day    Lisinopril 10 Mg Tabs (Lisinopril) .[name]... Take one tablet by mouth daily    Lasix 40 Mg Tabs (Furosemide) .[name]... Take 1 tablet by mouth once a day    Spironolactone 25 Mg Tabs (Spironolactone) .[name]... Take 1 tablet by mouth daily  Problem # 3:  IMPLANTABLE DEFIBRILLATOR, GDT CONTAK H170-MADIT-CRT (ICD-V45.02) DrKlein plans generator change soon...  Problem # 4:  HYPERLIPIDEMIA (GYJ-856.4) FLP looks good on his diet + Simva20... His updated medication list for this problem includes:    Simvastatin 20 Mg Tabs (Simvastatin) .[name]... Take one tablet by mouth daily at bedtime  Problem # 5:  CROHN'S DISEASE (ICD-555.9) GI per DrChowdury & f/u due soon..  Problem # 6:  Hx of ACUTE VASCULAR INSUFFICIENCY OF INTESTINE (ICD-557.0) He remains on Coumadin via the CC...  Problem # 7:  HYPERTROPHY PROSTATE W/UR OBST & OTH LUTS (ICD-600.01) GU per DrNesi...  Problem # 8:  VITAMIN B12 DEFICIENCY (ICD-266.2) Vit B 12 level looks good on daily oral supplement, 1034mg.d>  continue same.  Problem # 9:  VITAMIN D DEFICIENCY (ICD-268.9) He has been stable on the OTC Vit D supplement...  Problem # 10:  OTHER MEDICAL PROBLEMS AS NOTED>>>  Complete Medication List: 1)  Coumadin 5 Mg Tabs (Warfarin sodium) .... Use as directed by anticoagulation clinic. 2)  Bayer Aspirin Ec Low Dose 81 Mg Tbec (Aspirin) .... Take 1 tablet by mouth once a day 3)  Carvedilol 25 Mg Tabs (Carvedilol) .... Take one tablet by mouth twice a day 4)  Lisinopril 10 Mg Tabs (Lisinopril) .... Take one tablet by mouth daily 5)  Lasix 40 Mg Tabs (Furosemide) .... Take 1 tablet by mouth once a day 6)  Spironolactone 25 Mg  Tabs (Spironolactone) .... Take 1 tablet by mouth daily 7)  Simvastatin 20 Mg Tabs (Simvastatin) .... Take one tablet by mouth daily at bedtime 8)  Mercaptopurine 50 Mg Tabs (Mercaptopurine) .... Take 1.5 tabs by mouth once daily as directed by drchowdhury... 9)  Vitamin B-12 1000 Mcg Tabs (Cyanocobalamin) .... Take one tablet by mouth once daily. 10)  Folic Acid 1 Mg Tabs (Folic acid) .... Take 1 tablet by mouth once a day 11)  Vitamin D 1000 Unit Tabs (Cholecalciferol) .... Take 1-2 tablets by mouth once daily  Patient Instructions: 1)  Today we updated your med list- see below.... 2)  We gave you the 2011 Flu vaccine today... 3)  Good luck w/ the up coming stress test & pacer change... 4)  Today we did your follow up fasting blood work... please call the "phone tree" in a few days for your lab results.Marland KitchenMarland Kitchen  5)  We will send copies to DrChowdury.Marland KitchenMarland Kitchen 6)  Call for any questions.Marland KitchenMarland Kitchen 7)  Please schedule a follow-up appointment in 6 months.

## 2010-03-02 NOTE — Progress Notes (Signed)
Summary: REFILL   Phone Note From Pharmacy Call back at [phone]   Caller: [facility]  Summary of Call: CALLING REGARDING THE [facility]#[id number] Initial call taken by: Delsa Sale,  February 19, 2009 4:23 PM  Follow-up for Phone Call        faxed to Pharm. 90 x 1 Follow-up by: Lynden Ang,  February 20, 2009 8:52 AM    Prescriptions: KLOR-CON 20 MEQ  PACK (POTASSIUM CHLORIDE) Take 1 tablet by mouth once a day  #90 x 2   Entered by:   Lynden Ang   Authorized by:   Nikki Dom, MD, [facility]   Signed by:   Lynden Ang on 02/20/2009   Method used:   Faxed to ...       [facility] (mail-order)             ,          Ph: [phone]       Fax: [phone]   RxID:   [id number]

## 2010-03-02 NOTE — Assessment & Plan Note (Signed)
Summary: PER CHECK OUT/ALSO LAB/SAF      Allergies Added: NKDA  Visit Type:  Follow-up Primary Provider:  Teressa Lower, MD  CC:  cardiomyopathy.  History of Present Illness: The patient is seen for followup cardiomyopathy.  I saw him last July 17, 2009.  He's feeling well.  He has not had any return of dizziness since we lower the dose of lisinopril.  We started Aldactone 12.5 mg.  His first chemistry followup revealed a potassium of 3.9.    Current Medications (verified): 1)  Bayer Aspirin Ec Low Dose 81 Mg  Tbec (Aspirin) .... Take 1 Tablet By Mouth Once A Day 2)  Carvedilol 25 Mg Tabs (Carvedilol) .... Take One Tablet By Mouth Twice A Day 3)  Lisinopril 10 Mg Tabs (Lisinopril) .... Take One Tablet By Mouth Daily 4)  Lasix 40 Mg  Tabs (Furosemide) .... Take 1 Tablet By Mouth Once A Day 5)  Spironolactone 25 Mg Tabs (Spironolactone) .... Take 1/2 Tablet By Mouth Daily 6)  Simvastatin 20 Mg Tabs (Simvastatin) .... Take One Tablet By Mouth Daily At Bedtime 7)  Mercaptopurine 50 Mg  Tabs (Mercaptopurine) .... Take 1.5 Tabs By Mouth Once Daily As Directed By Drchowdhury... 8)  Vitamin B-12 1000 Mcg Tabs (Cyanocobalamin) .... Take One Tablet By Mouth Once Daily. 9)  Folic Acid 1 Mg  Tabs (Folic Acid) .... Take 1 Tablet By Mouth Once A Day 10)  Vitamin D 1000 Unit Tabs (Cholecalciferol) .... Take 1-2 Tablets By Mouth Once Daily 11)  Coumadin 5 Mg Tabs (Warfarin Sodium) .... Use As Directed By Anticoagulation Clinic.  Allergies (verified): No Known Drug Allergies  Past History:  Past Medical History: Positional dizziness.... may, 2011 HYPERTENSION (ICD-401.9) CARDIOMYOPATHY, ISCHEMIC (ICD-414.8) CAD CABG 1994 CHF.... chronic systolic  EF 38%... echo... November, 2009 Bradycardia.... 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) LBBB (ICD-426.3) HYPERLIPIDEMIA (ICD-272.4) GERD (ICD-530.81) COLONIC POLYPS (ICD-211.3) CROHN'S DISEASE (ICD-555.9) Hx of  ACUTE VASCULAR INSUFFICIENCY OF INTESTINE (ICD-557.0) VENTRAL HERNIA (ICD-553.20) HYPERTROPHY PROSTATE W/UR OBST & OTH LUTS (ICD-600.01) DEGENERATIVE JOINT DISEASE (ICD-715.90) Hx of POLYMYALGIA RHEUMATICA (ICD-725) TREMOR (ICD-781.0) ANXIETY (ICD-300.00) Hx of SHINGLES (ICD-053.9) VITAMIN B12 DEFICIENCY (ICD-266.2) VITAMIN D DEFICIENCY (ICD-268.9)  Review of Systems       Patient denies fever, chills, headache, sweats, rash, change in vision, change in hearing, chest pain, cough, nausea vomiting, urinary symptoms.  All other systems are reviewed and are negative  Vital Signs:  Patient profile:   77 year old male Height:      72 inches Weight:      198 pounds BMI:     26.95 Pulse rate:   62 / minute Resp:     16 per minute BP sitting:   128 / 78  (right arm)  Vitals Entered By: Levora Angel, CNA (August 13, 2009 11:25 AM)  Physical Exam  General:  he looks good today. Eyes:  no xanthelasma. Neck:  no jugular venous distention. Lungs:  lungs are clear respiratory effort is nonlabored. Heart:  cardiac exam reveals S1-S2.  No clicks or significant murmurs. Abdomen:  abdomen is soft. Extremities:  no peripheral edema. Psych:  patient is oriented to person time and place.  Affect is normal.    ICD Specifications Following MD:  Virl Axe, MD     ICD Vendor:  [place] Number:  [id number]     ICD Serial Number:  [id number] ICD DOI:  09/07/2004     ICD Implanting MD:  Carleene Overlie  Lovena Le, MD  Lead 1:    Location: RA     DOI: 09/07/2004     Model #: [id number]     Serial #: [id number]     Status: active Lead 2:    Location: RV     DOI: 09/07/2004     Model #: [id number]     Serial #: [id number]     Status: active Lead 3:    Location: LV     DOI: 10/11/2004     Model #: [id number]     Serial #: [id number]     Status: active  Indications::  ICM, CHF, MADIT-CRT   ICD Follow Up ICD Dependent:  No      Episodes Coumadin:  Yes  Brady Parameters Mode DDD     Lower Rate Limit:  60     Upper Rate  Limit 140 PAV 60      Tachy Zones VF:  210     VT:  180     Impression & Recommendations:  Problem # 1:  * POSITIONAL DIZZINESS His dizziness is improved.  We will keep him on a lower dose of lisinopril.  Problem # 2:  SINUS BRADYCARDIA (ICD-427.81) Heart rate is stable.  No change in therapy.  Problem # 3:  CHRONIC SYSTOLIC HEART FAILURE (EQA-834.19) The patient is tolerating 12.5 mg of Aldactone.  Potassium and creatinine are stable.  Lab rechecked again today.  If potassium and creatinine remain stable, his dose will be increased to 25 mg daily. I will  see him back in 6 weeks.  He'll have one more chemistry checked 2 weeks after his Aldactone was increased.  Patient Instructions: 1)  Lab today, I will call you with the results, if ok we will increase your Spironolactone to 31m daily 2)  Follow up in 6 weeks

## 2010-03-02 NOTE — Medication Information (Signed)
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 no    Recent/future dental: no  Any missed doses?: no       Is patient compliant with meds? yes       Allergies (verified): No Known Drug Allergies  Anticoagulation Management History:      The patient is taking warfarin and comes in today for a routine follow up visit.  Positive risk factors for bleeding include an age of 75 years or older.  The bleeding index is 'intermediate risk'.  Positive CHADS2 values include History of CHF, History of HTN, and Age > 55 years old.  The start date was 02/13/2000.  His last INR was 3.3 ratio.  Anticoagulation responsible provider: Burt Knack MD, Legrand Como.  INR POC: 2.8.  Cuvette Lot#: [id number].  Exp: 07/2010.    Anticoagulation Management Assessment/Plan:      The patient's current anticoagulation dose is Coumadin 5 mg tabs: Use as directed by anticoagulation clinic..  The target INR is 2.0-3.0.  The next INR is due 06/16/2009.  Anticoagulation instructions were given to patient.  Results were reviewed/authorized by Freddrick March, RN, BSN.  He was notified by Freddrick March RN.         Prior Anticoagulation Instructions: INR 2.7  Continue taking 3 tablets daily.  Return to clinic in 4 weeks.   Current Anticoagulation Instructions: INR 2.8  Continue on same dosage 3 tablets daily.  Recheck in 4 weeks.

## 2010-03-02 NOTE — Assessment & Plan Note (Signed)
Summary: 4 week follow up/la   Primary Care Provider:  Teressa Lower, MD  CC:  4 week ROV....  History of Present Illness: [name] here for a follow up visit... he has multiple medical problems as noted below...     ~  [place]owdhury [place] w/ sm bowel Crohn's- s/p mult resections w/ complications, stable on 6-MP for prevention of recurrence... also has sl incr fasting bili c/w Gilbert's...  ~  followed by [name] for Cards- w/ CAD, s/p CABG, Cardiomyopathy w/ AICD- stable... f/u 2DEcho 11/09 showed diffuseHK w/ EF= 35% & DD, mod incr AoV thickness w/o AS... EKG= pacer rhythm.   ~  Feb10:  he had URI in Dec Rx w/ Augmentin by TP and improved... he complains of his nose running clear water when he is in & out of the cold air... his CC is intermittent "shaking" of his hands/ head/ etc... occas hard to eat because of the tremor but it is quite intermittent... **we discussed referral to neurology for further eval of this problem >> saw DrYan 3/10 w/ Dx essential tremor (& +FamHx of same); trial Clonazepam 0.79m Bid...  labs showed B12= 215, & VitD= 23 >> started oral B12 & VitD supplements.  ~  Oct10:  he's had a good 8 months- saw DrKatz 5/10 & DrKlein 8/10: cardiomyopathy stable & compensated, ICD functioning well, but couldn't up titrate the Coreg due to bradycardia...  he also saw DrNesi 7/10 w/ BPH, obstructive symptoms, and ED- given  Flomax & "shots"... still followed regularly by GI- [place]owdhury [place]'s on 6MP... pt c/o some nausea and has f/u appt next week... we will f/u labs...   ~  December 02, 2008:  add-on for sinusitis- c/o 1 wk hx URI "cold" taking OTC Mucinex, then yellow drainage, fever low grade & sweats, right maxillary pain & congestion... exam w/ max tenderness & XRay w/ mucosal thickening & AF level on right- discussed Rx w/ Augmentin, Depo, Dosepak >> improved...   ~  March 31, 2009:  Add-on for persistant symptoms- had URI w/ fever, feeling bad, some  confusion, HA, sinusitis symptoms and bronchitic symptoms w/ cough, brown phlegm, congestion, etc... he refused to go to the ER- was given Avelox & Hydromet cough syrup... only min better & family concerned- he's been taking MucinexD & I told them to stop all "D" preps- no pseudophed allowed... we decided to check CXR (NAD), Labs (OK x sl incr LFTs/ Protime 33.5 sec/ Sed=51); & Rx w/ Augmentin, Depo/ Dosepak, Mucinex Max Bid, Fluids, etc >> improved.   ~  April 27, 2009:  short term f/u for above symptoms- improved, feeling stronger, appetite improved (eating buttermilk & cottage cheese) etc...  BP on the low side but no postural changes & exam neg... we decided to continue current meds- he will f/u w/ GI & Cards over the next month or two & see me after that...   Current Problems:   HBP, CAD, Cardiomyopathy - followed by DBenay Spiceand DrTaylor... he had CABG in 1994 (with cath in 96 revealing a total right and an 80% OM, vein graft to the diagonal was occluded, other bypasses were patent), Biv AICD in 2006... currently taking COUMADIN (follow in clinic), ASA 8542md,  COREG 12.42m33md,  LISINOPRIL 55m62m  LASIX 40mg84m K20 1/d... doing reasonably well on Rx- last Cardiolite 3/03 w/ EF=37%... his last cath was 6/06 w/ native vessel and graft disease... last 2DEcho 11/09 stable  w/ mod LVD- diffuse HK w/ EF= 35%, DD, mod incr AoV thickness w/o AS...   ~  11/10: saw DrKatz for f/u & stable- same meds, no changes made.  ~  2/11: saw DrKlein for f/u AICD- stable, no chnages made.  ~  3/11:  CXR showed stable CABG, AICD, & chr changes...  HYPERLIPIDEMIA - he takes SIMVASTATIN 53m/d... he's had min, non-progressive incr LFTs noted.  ~  FSalem4/08 showed TChol 138, TG 138, HDL 38, LDL 72... tolerating med well.  ~  FHolbrook4/09 showed TChol 118, TG 111, HDL 33, LDL 63  ~  FLP 12/09 showed TChol 118, TG 100, HDL 31, LDL 67  ~  FLP 10/10 showed TChol 111, TG 139, HDL 36, LDL 47  GERD (ICD-530.81) - no active  symptoms and not currently taking PPI etc.  COLONIC POLYPS (ICD-211.3) - last colonoscopy here was 7/04 by DDemetra Shinerw/ divertics and several polyps from 6 - 256msize, reportedly adenomatous on bx but I cannot find the path reports... he is overdue for f/u colon...  ~  labs 2/10 showed Hg= 15.7...Marland KitchenMarland Kitchene will send copy of note to DrChowdhury w/ request for f/u colonoscopy.  ~  f/u colonoscopy 11/10 by DrChowghury showed norm TI & several polyps removed- path +tubular adenoma...  CROHN'S DISEASE (ICD-555.9) - he is [place] DrChowdhury [place] on 6-MP 5069mabs- 1.5 tabs/d... he notes intermittent diarrhea and was started on Questran Prn...  ~  labs 3/11 noted sl incr LFTs, Protime, & Sed...  Hx of ACUTE VASCULAR INSUFFICIENCY OF INTESTINE (ICD-557.0) - he has embolectomy of the superior mesenteric artery 1/03 by DrLSheryn Bisonong hospitalization w/ complications)... he has remained on Coumadin in the coumadin clinic ever since then...  VENTRAL HERNIA (ICD-553.20) - he is s/p left inguinal hernia repair 6/08 by DrIClarita Lebernd had a prev incarcerated ventral hernia repaired 11/03... his residual/ recurrent ventral hernia has been evaluated by DrIClarita Lebero feels that [place] need [place] hernia comes to surg... currently he wears an abd binder when up and about...  HYPERTROPHY PROSTATE W/UR OBST & OTH LUTS (ICD-600.01) - saw DrNesi 7/10 w/ BPH, obstructive symptoms, and ED- given  Flomax & "shots"...   DEGENERATIVE JOINT DISEASE (ICD-715.90)  Hx of POLYMYALGIA RHEUMATICA (ICD-725) - hasn't req steroids x yrs...  ~  labs 2/10 showed Sed= 8  ~  labs 10/10 showed Sed= 6  ~  labs 3/11 showed Sed= 51 w/ acute illness.  TREMOR (ICD-781.0) - eval 3/10 by DrYDalphine HandinguilfordNeurology- essential tremor w/ +FamHx in sister... trial Clonazepam 0.36m63md w/ improvement...  ANXIETY (ICD-300.00) - he uses the same Clonazepam 0.36mg89m...  Hx of SHINGLES (ICD-053.9) VITAMIN B12 DEFICIENCY  (ICD-266.2) - on B 12 1000mcg42mTC...  ~  labs 2/10 showed B12 level = 215... rec start oral B12 supplement daily.  ~  labs 10/10 showed B12 level = 416... continue oral B12 supplement.  VITAMIN D DEFICIENCY (ICD-268.9) - on Vit D 1000 u daily OTC...  ~  labs 2/10 showed Vit D level = 23... rec> start Vit D 1000 u daily...   Allergies (verified): No Known Drug Allergies  Comments:  Nurse/Medical Assistant: The patient's medications and allergies were reviewed with the patient and were updated in the Medication and Allergy Lists.  Past History:  Past Medical History:  HYPERTENSION (ICD-401.9) CARDIOMYOPATHY, ISCHEMIC (ICD-414.8) CAD CABG 1994 CHF.... chronic systolic  EF 35%...63%ho... November, 2009 Bradycardia.... 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-MADIT-CRT (  ICD-V45.02) LBBB (ICD-426.3) HYPERLIPIDEMIA (ICD-272.4) GERD (ICD-530.81) COLONIC POLYPS (ICD-211.3) CROHN'S DISEASE (ICD-555.9) Hx of ACUTE VASCULAR INSUFFICIENCY OF INTESTINE (ICD-557.0) VENTRAL HERNIA (ICD-553.20) HYPERTROPHY PROSTATE W/UR OBST & OTH LUTS (ICD-600.01) DEGENERATIVE JOINT DISEASE (ICD-715.90) Hx of POLYMYALGIA RHEUMATICA (ICD-725) TREMOR (ICD-781.0) ANXIETY (ICD-300.00) Hx of SHINGLES (ICD-053.9) VITAMIN B12 DEFICIENCY (ICD-266.2) VITAMIN D DEFICIENCY (ICD-268.9)  Past Surgical History:  S/P CABG in 1994 S/P laparoscopic cholecystectomy in 2002 S/P ELap w/ superior mesenteric artery embolectomy by [facility] 1/03 S/P repair of incarcerated ventral hernia & lysis of adhesions 11/03 by DrHIngram S/P implantation of biventric cardioverter-defibrillator by DrTaylor in 8/06-[employer] Contak H170 S/P left inguinal hernia repair w/ mesh 6/08 by Joaquin [place]  Family History: Reviewed history from 11/06/2008 and no changes required. Coronary Heart Disease Hypertension Hyperlipidemia Sister had tremor...  Social History: Reviewed history from 03/26/2009 and no changes  required. Married-3 children former smoker, quit in 1975 x74yr 1ppd. Social alcohol Employ- bulldozer driver  Review of Systems      See HPI       The patient complains of anorexia, dyspnea on exertion, and muscle weakness.  The patient denies fever, weight loss, weight gain, vision loss, decreased hearing, hoarseness, chest pain, syncope, peripheral edema, prolonged cough, headaches, hemoptysis, abdominal pain, melena, hematochezia, severe indigestion/heartburn, hematuria, incontinence, suspicious skin lesions, transient blindness, difficulty walking, depression, unusual weight change, abnormal bleeding, enlarged lymph nodes, and angioedema.    Vital Signs:  Patient profile:   77year old male Height:      72 inches Weight:      186.38 pounds O2 Sat:      97 % on Room air Temp:     97.0 degrees F oral Pulse rate:   69 / minute BP sitting:   100 / 66  (left arm) Cuff size:   regular  Vitals Entered By: LElita BooneCMA (April 27, 2009 2:46 PM)  O2 Sat at Rest %:  97 O2 Flow:  Room air CC: 4 week ROV... Is Patient Diabetic? No Pain Assessment Patient in pain? no      Comments meds updated today   Physical Exam  Additional Exam:  WD, WN, 77y/o WM in NAD... GENERAL:  Alert & oriented; pleasant & cooperative... HEENT:  [place]/AT, EOM-wnl, PERRLA, EACs-clear, TMs-wnl, NOSE- sl red/ inflamm, THROAT-clear & wnl. NECK:  Supple w/ fairROM; no JVD; normal carotid impulses w/o bruits; no thyromegaly or nodules palpated; no lymphadenopathy. CHEST:  Decr BS w/ few scat rhonchi, no wheezing, no rales, no signs of consolidation. HEART:  Regular Rhythm; gr 1/6 SEM without rubs or gallops detected... ABDOMEN:  Soft & nontender; mult scars, & ventral hernia; normal bowel sounds; no organomegaly or masses palpated. EXT: without deformities, mod arthritic changes; no varicose veins/ venous insuffic/ or edema. NEURO:  CN's intact; I do not apprec a tremor at present; no focal neuro  deficits... DERM:  No lesions noted; no rash etc...    MISC. Report  Procedure date:  04/27/2009  Findings:      Prev data, labs, XRays all reviewed w/ pt & his wife...  SN   Impression & Recommendations:  Problem # 1:  CARDIAC>>> Hx HBP (now on the low side), CAD, CABG, Cardiomyopathy>> remarkably stable to have such severe disease... he has f/u DrKatz soon... continue current Rx.  Problem # 2:  HYPERLIPIDEMIA (IPRF-1634) Stable o the Simva20... keep same for now. His updated medication list for this problem includes:    Simvastatin 20 Mg Tabs (Simvastatin) ..[name].. Take one tablet by  mouth daily at bedtime  Problem # 3:  CROHN'S DISEASE (ICD-555.9) GI [place]Chowdry [place]...  Problem # 4:  TREMOR (ICD-781.0) They note that it is not much better on the Klonopin... discussed poss incr dose but they want to keep it the same for now.  Problem # 5:  OTHER MEDICAL PROBLEMS AS NOTED>>>  Complete Medication List: 1)  Bayer Aspirin Ec Low Dose 81 Mg Tbec (Aspirin) .... Take 1 tablet by mouth once a day 2)  Carvedilol 25 Mg Tabs (Carvedilol) .... Take one tablet by mouth twice a day 3)  Lisinopril 20 Mg Tabs (Lisinopril) .... Take one tablet by mouth daily 4)  Lasix 40 Mg Tabs (Furosemide) .... Take 1 tablet by mouth once a day 5)  Klor-con 20 Meq Pack (Potassium chloride) .... Take 1 tablet by mouth once a day 6)  Simvastatin 20 Mg Tabs (Simvastatin) .... Take one tablet by mouth daily at bedtime 7)  Mercaptopurine 50 Mg Tabs (Mercaptopurine) .... Take 1.5 tabs by mouth once daily as directed by drchowdhury... 8)  Vitamin B-12 1000 Mcg Tabs (Cyanocobalamin) .... Take one tablet by mouth once daily. 9)  Folic Acid 1 Mg Tabs (Folic acid) .... Take 1 tablet by mouth once a day 10)  Vitamin D 1000 Unit Tabs (Cholecalciferol) .... Take 1-2 tablets by mouth once daily 11)  Clonazepam 0.5 Mg Tabs (Clonazepam) .... Take 1 tablet by mouth daily 12)  Coumadin 5 Mg Tabs (Warfarin  sodium) .... Use as directed by anticoagulation clinic. 13)  Zegerid 40-1100 Mg Caps (Omeprazole-sodium bicarbonate) .... As needed 14)  Hydromet 5-1.5 Mg/68m Syrp (Hydrocodone-homatropine) ..[name]. 1-2 tsp every 4-6 hrs as needed cough/congestion , may make you sleepy 15)  Mucinex Maximum Strength 1200 Mg Xr12h-tab (Guaifenesin) .... Take 1 tab by mouth two times a day  Patient Instructions: 1)  Today we updated your med list- see below.... 2)  Continue your current meds the same... 3)  Gradually increase your activity..Marland KitchenMarland Kitchen4)  Call for any problems..Marland KitchenMarland Kitchen5)  Please schedule a follow-up appointment in 3-4 months.

## 2010-03-02 NOTE — Progress Notes (Signed)
Summary: TALK TO NURSE---waiting on fax  Phone Note Outgoing Call Call back at [phone]   Caller: [facility] ( [place] Call For: Dillian Feig Call placed to: Tyeasha Ebbs Summary of Call: NEED TO TALK TO [place] Initial call taken by: Gustavus Bryant,  Jun 30, 2009 3:11 PM  Follow-up for Phone Call        called and spoke with [facility] and wanted to inform us that pt has ordered a vaccum pump device and they are faxing over an order to the triage fax for SN to sign.  Will await fax.  Matthew Folks LPN  Jun 30, 6544 5:03 PM   Additional Follow-up for Phone Call Additional follow up Details #1::        pt can discuss this with SN on his appt june 21.  thanks [place]  Jun 30, 2009 5:34 PM

## 2010-03-02 NOTE — Assessment & Plan Note (Signed)
Summary: 3 month follow up/la   Primary Care Provider:  Teressa Lower, MD  CC:  3 month ROV & review....  History of Present Illness: [name] here for a follow up visit... he has multiple medical problems as noted below...     ~  [place]owdhury [place] w/ sm bowel Crohn's- s/p mult resections w/ complications, stable on 6-MP for prevention of recurrence... also has sl incr fasting bili c/w Gilbert's...  ~  followed by [name] for Cards- w/ CAD, s/p CABG, Cardiomyopathy w/ AICD- stable... f/u 2DEcho 11/09 showed diffuseHK w/ EF= 35% & DD, mod incr AoV thickness w/o AS... EKG= pacer rhythm.   ~  Feb10:  he had URI in Dec Rx w/ Augmentin by TP and improved... he complains of his nose running clear water when he is in & out of the cold air... his CC is intermittent "shaking" of his hands/ head/ etc... occas hard to eat because of the tremor but it is quite intermittent... **we discussed referral to neurology for further eval of this problem >> saw DrYan 3/10 w/ Dx essential tremor (& +FamHx of same); trial Clonazepam 0.15m Bid...  labs showed B12= 215, & VitD= 23 >> started oral B12 & VitD supplements.  ~  Oct10:  he's had a good 8 months- saw DrKatz 5/10 & DrKlein 8/10: cardiomyopathy stable & compensated, ICD functioning well, but couldn't up titrate the Coreg due to bradycardia...  he also saw DrNesi 7/10 w/ BPH, obstructive symptoms, and ED- given  Flomax & "shots"... still followed regularly by GI- [place]owdh[place]'s on 6MP... pt c/o some nausea and has f/u appt next week... we will f/u labs...  ~  Nov10:  add-on for sinusitis- c/o 1 wk hx URI "cold" taking OTC Mucinex, then yellow drainage, fever low grade & sweats, right maxillary pain & congestion... exam w/ max tenderness & XRay w/ mucosal thickening & AF level on right- discussed Rx w/ Augmentin, Depo, Dosepak >> improved...   ~  Mar11:  he had persistant URI symptoms- sinusitis, bronchitis> required Augmentin, Depo/  Pred, Mucinex, etc & finally improved.  ~  July 21, 2009:  [facility] & SPIRONOLACTONE 12.535md started & KCl stopped- they plan f/u labs soon... he notes dizziness improved, still w/ mult somatic complaints & we discussed holding tight for now.   Current Problems:   HBP, CAD, Cardiomyopathy - followed by DrBenay Spicend DrTaylor... he had CABG in 1994 (with cath in 96 revealing a total right and an 80% OM, vein graft to the diagonal was occluded, other bypasses were patent), Biv AICD in 2006... currently taking COUMADIN (follow in clinic), ASA 8154m,  COREG 12.5mg54m,  LISINOPRIL 20mg54m LASIX 40mg/74mK20 1/d... doing reasonably well on Rx- last Cardiolite 3/03 w/ EF=37%... his last cath was 6/06 w/ native vessel and graft disease... last 2DEcho 11/09 stable w/ mod LVD- diffuse HK w/ EF= 35%, DD, mod incr AoV thickness w/o AS...   ~  11/10: saw DrKatz for f/u & stable- same meds, no changes made.  ~  2/11: saw DrKlein for f/u AICD- stable, no chnages made.  ~  3/11:  CXR showed stable CABG, AICD, & chr changes...  HYPERLIPIDEMIA - he takes SIMVASTATIN 20mg/d57mhe's had min, non-progressive incr LFTs noted.  ~  FLP 4/0[place] TChol 138, TG 138, HDL 38, LDL 72... tolerating med well.  ~  FLP 4/0[place] TChol 118, TG  111, HDL 33, LDL 63  ~  FLP 12/09 showed TChol 118, TG 100, HDL 31, LDL 67  ~  FLP 10/10 showed TChol 111, TG 139, HDL 36, LDL 47  GERD (ICD-530.81) - no active symptoms and not currently taking PPI etc.  COLONIC POLYPS (ICD-211.3) - last colonoscopy here was 7/04 by Demetra Shiner w/ divertics and several polyps from 6 - 106m size, reportedly adenomatous on bx but I cannot find the path reports... he is overdue for f/u colon...  ~  labs 2/10 showed Hg= 15.7..Marland KitchenMarland Kitchenwe will send copy of note to DrChowdhury w/ request for f/u colonoscopy.  ~  f/u colonoscopy 11/10 by DrChowghury showed norm TI & several polyps removed- path +tubular adenoma...  CROHN'S DISEASE  (ICD-555.9) - he is followed [place] DrChowdhury [place] on 6-MP 562mtabs- 1.5 tabs/d... he notes intermittent diarrhea and was started on Questran Prn...  ~  labs 3/11 noted sl incr LFTs, Protime, & Sed...  Hx of ACUTE VASCULAR INSUFFICIENCY OF INTESTINE (ICD-557.0) - he has embolectomy of the superior mesenteric artery 1/03 by DrSheryn Bisonlong hospitalization w/ complications)... he has remained on Coumadin in the coumadin clinic ever since then...  VENTRAL HERNIA (ICD-553.20) - he is s/p left inguinal hernia repair 6/08 by DrClarita Leberand had a prev incarcerated ventral hernia repaired 11/03... his residual/ recurrent ventral hernia has been evaluated by DrClarita Leberho feels that [place] surg... currently he wears an abd binder when up and about...  HYPERTROPHY PROSTATE W/UR OBST & OTH LUTS (ICD-600.01) - saw DrNesi 7/10 w/ BPH, obstructive symptoms, and ED- given  Flomax & "shots"...   DEGENERATIVE JOINT DISEASE (ICD-715.90)  Hx of POLYMYALGIA RHEUMATICA (ICD-725) - hasn't req steroids x yrs...  ~  labs 2/10 showed Sed= 8  ~  labs 10/10 showed Sed= 6  ~  labs 3/11 showed Sed= 51 w/ acute illness.  TREMOR (ICD-781.0) - [facility] 3[facility]10 [facility]- essential tremor w/ +FamHx in sister... trial Clonazepam 0.2561mid w/ improvement...  ANXIETY (ICD-300.00) - he uses the same Clonazepam 0.21m11mn...  Hx of SHINGLES (ICD-053.9) VITAMIN B12 DEFICIENCY (ICD-266.2) - on B 12 1000mc70mOTC...  ~  labs 2/10 showed B12 level = 215... rec start oral B12 supplement daily.  ~  labs 10/10 showed B12 level = 416... continue oral B12 supplement.  VITAMIN D DEFICIENCY (ICD-268.9) - on Vit D 1000 u daily OTC...  ~  labs 2/10 showed Vit D level = 23... rec> start Vit D 1000 u daily..    Preventive Screening-Counseling & Management  Alcohol-Tobacco     Smoking Status: quit < 6 months     Year Quit: 1975  Allergies (verified): No Known Drug  Allergies  Comments:  Nurse/Medical Assistant: The patient's medications and allergies were reviewed with the patient and were updated in the Medication and Allergy Lists.  Past History:  Past Medical History: Positional dizziness.... may, 2011 HYPERTENSION (ICD-401.9) CARDIOMYOPATHY, ISCHEMIC (ICD-414.8) CAD CABG 1994 CHF.... chronic systolic  EF 35%..44%cho... November, 2009 Bradycardia.... 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) LBBB (ICD-426.3) HYPERLIPIDEMIA (ICD-272.4) GERD (ICD-530.81) COLONIC POLYPS (ICD-211.3) CROHN'S DISEASE (ICD-555.9) Hx of ACUTE VASCULAR INSUFFICIENCY OF INTESTINE (ICD-557.0) VENTRAL HERNIA (ICD-553.20) HYPERTROPHY PROSTATE W/UR OBST & OTH LUTS (ICD-600.01) DEGENERATIVE JOINT DISEASE (ICD-715.90) Hx of POLYMYALGIA RHEUMATICA (ICD-725) TREMOR (ICD-781.0) ANXIETY (ICD-300.00) Hx of SHINGLES (ICD-053.9) VITAMIN B12 DEFICIENCY (ICD-266.2) VITAMIN D DEFICIENCY (ICD-268.9)  Past Surgical History: S/P CABG in 1994 S/P laparoscopic cholecystectomy in  2002 S/P ELap w/ superior mesenteric artery embolectomy by [facility] 1/03 S/P repair of incarcerated ventral hernia & lysis of adhesions 11/03 by DrHIngram S/P implantation of biventric cardioverter-defibrillator by DrTaylor in 8/06-[employer] Contak H170 S/P left inguinal hernia repair w/ mesh 6/08 by Joaquin Bend  Family History: Reviewed history from 11/06/2008 and no changes required. Coronary Heart Disease Hypertension Hyperlipidemia Sister had tremor...  Social History: Reviewed history from 03/26/2009 and no changes required. Married-3 children former smoker, quit in 1975 x41yr 1ppd. Social alcohol Employ- bulldozer driver Smoking Status:  quit < 6 months  Review of Systems      See HPI       The patient complains of decreased hearing, dyspnea on exertion, and peripheral edema.  The patient denies anorexia, fever, weight loss, weight gain, vision loss,  hoarseness, chest pain, syncope, prolonged cough, headaches, hemoptysis, abdominal pain, melena, hematochezia, severe indigestion/heartburn, hematuria, incontinence, muscle weakness, suspicious skin lesions, transient blindness, difficulty walking, depression, unusual weight change, abnormal bleeding, enlarged lymph nodes, and angioedema.    Vital Signs:  Patient profile:   77year old male Height:      72 inches Weight:      197 pounds BMI:     26.81 O2 Sat:      96 % on Room air Temp:     97.8 degrees F oral Pulse rate:   59 / minute BP sitting:   110 / 70  (left arm) Cuff size:   regular  Vitals Entered By: LElita BooneCMA (July 21, 2009 3:13 PM)  O2 Sat at Rest %:  96 O2 Flow:  Room air CC: 3 month ROV & review... Is Patient Diabetic? No Pain Assessment Patient in pain? no      Comments no changes in meds today   Physical Exam  Additional Exam:  WD, WN, 77y/o WM in NAD... GENERAL:  Alert & oriented; pleasant & cooperative... HEENT:  [place]/AT, EOM-wnl, PERRLA, EACs-clear, TMs-wnl, NOSE- sl red/ inflamm, THROAT-clear & wnl. NECK:  Supple w/ fairROM; no JVD; normal carotid impulses w/o bruits; no thyromegaly or nodules palpated; no lymphadenopathy. CHEST:  Decr BS w/ few scat rhonchi, no wheezing, no rales, no signs of consolidation. HEART:  Regular Rhythm; gr 1/6 SEM without rubs or gallops detected... ABDOMEN:  Soft & nontender; mult scars, & ventral hernia; normal bowel sounds; no organomegaly or masses palpated. EXT: without deformities, mod arthritic changes; no varicose veins/ venous insuffic/ or edema. NEURO:  CN's intact; I do not apprec a tremor at present; no focal neuro deficits... DERM:  No lesions noted; no rash etc...    Impression & Recommendations:  Problem # 1:  CARDIAC>>> He has HBP, CAD, s/p CABG, Cardiomyopathy on Coumadin w/ AICD implanted... Followed by DBenay Spice& Aldactone 12.525madded to the Lasix 40... his Lisinopril was prev decreased & KCl  stopped> f/u labs planned this week...   Problem # 2:  HYPERLIPIDEMIA (ICCHE-527) FLP has been good on Simva20- f/u FLP soon. His updated medication list for this problem includes:    Simvastatin 20 Mg Tabs (Simvastatin) ...[name]. Take one tablet by mouth daily at bedtime  Problem # 3:  CROHN'S DISEASE (ICD-555.9) [place] DrChowdhury & doing satis on 6MP but he has alot of somatic complaints...  Problem # 4:  VENTRAL HERNIA (ICD-553.20) He contiues to wear his abd binder...  Problem # 5:  OTHER MEDICAL PROBLEMS AS NOTED>>>  Complete Medication List: 1)  Bayer Aspirin Ec Low Dose 81 Mg Tbec (  Aspirin) .... Take 1 tablet by mouth once a day 2)  Carvedilol 25 Mg Tabs (Carvedilol) .... Take one tablet by mouth twice a day 3)  Lisinopril 10 Mg Tabs (Lisinopril) .... Take one tablet by mouth daily 4)  Lasix 40 Mg Tabs (Furosemide) .... Take 1 tablet by mouth once a day 5)  Spironolactone 25 Mg Tabs (Spironolactone) .... Take 1/2 tablet by mouth daily 6)  Simvastatin 20 Mg Tabs (Simvastatin) .... Take one tablet by mouth daily at bedtime 7)  Mercaptopurine 50 Mg Tabs (Mercaptopurine) .... Take 1.5 tabs by mouth once daily as directed by drchowdhury... 8)  Vitamin B-12 1000 Mcg Tabs (Cyanocobalamin) .... Take one tablet by mouth once daily. 9)  Folic Acid 1 Mg Tabs (Folic acid) .... Take 1 tablet by mouth once a day 10)  Vitamin D 1000 Unit Tabs (Cholecalciferol) .... Take 1-2 tablets by mouth once daily 11)  Coumadin 5 Mg Tabs (Warfarin sodium) .... Use as directed by anticoagulation clinic.  Patient Instructions: 1)  Today we updated your med list- see below.... 2)  Continue your current meds the same for now... 3)  Let's plan a follow up visit in 4-40mo& we'll do his FASTING blood work at that time..Marland KitchenMarland Kitchen

## 2010-03-02 NOTE — Assessment & Plan Note (Signed)
Summary: per check out/sf pt needs device check   Visit Type:  Follow-up Primary Provider:  Teressa Lower, MD  CC:  dizziness / edema.  History of Present Illness: The patient is seen for followup of his dizziness.  A new problem today is some increased edema.  I saw him last he was having some positional dizziness.  I decreased his lisinopril dose.  He feels that the dizziness is improved.  However he has noted the appearance of some edema that is not going down at nighttime.  He is not having PND orthopnea.  There is no significant new exertional dyspnea.  Current Medications (verified): 1)  Bayer Aspirin Ec Low Dose 81 Mg  Tbec (Aspirin) .... Take 1 Tablet By Mouth Once A Day 2)  Carvedilol 25 Mg Tabs (Carvedilol) .... Take One Tablet By Mouth Twice A Day 3)  Lisinopril 10 Mg Tabs (Lisinopril) .... Take One Tablet By Mouth Daily 4)  Lasix 40 Mg  Tabs (Furosemide) .... Take 1 Tablet By Mouth Once A Day 5)  Klor-Con 20 Meq  Pack (Potassium Chloride) .... Take 1 Tablet By Mouth Once A Day 6)  Simvastatin 20 Mg Tabs (Simvastatin) .... Take One Tablet By Mouth Daily At Bedtime 7)  Mercaptopurine 50 Mg  Tabs (Mercaptopurine) .... Take 1.5 Tabs By Mouth Once Daily As Directed By Drchowdhury... 8)  Vitamin B-12 1000 Mcg Tabs (Cyanocobalamin) .... Take One Tablet By Mouth Once Daily. 9)  Folic Acid 1 Mg  Tabs (Folic Acid) .... Take 1 Tablet By Mouth Once A Day 10)  Vitamin D 1000 Unit Tabs (Cholecalciferol) .... Take 1-2 Tablets By Mouth Once Daily 11)  Coumadin 5 Mg Tabs (Warfarin Sodium) .... Use As Directed By Anticoagulation Clinic.  Allergies (verified): No Known Drug Allergies  Past History:  Past Medical History: Positional dizziness.... may, 2011 HYPERTENSION (ICD-401.9) CARDIOMYOPATHY, ISCHEMIC (ICD-414.8) CAD CABG 1994 CHF.... chronic systolic  EF 24%... echo... November, 2009 Bradycardia.... 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) LBBB  (ICD-426.3) HYPERLIPIDEMIA (ICD-272.4) GERD (ICD-530.81) COLONIC POLYPS (ICD-211.3) CROHN'S DISEASE (ICD-555.9) Hx of ACUTE VASCULAR INSUFFICIENCY OF INTESTINE (ICD-557.0) VENTRAL HERNIA (ICD-553.20) HYPERTROPHY PROSTATE W/UR OBST & OTH LUTS (ICD-600.01) DEGENERATIVE JOINT DISEASE (ICD-715.90) Hx of POLYMYALGIA RHEUMATICA (ICD-725) TREMOR (ICD-781.0) ANXIETY (ICD-300.00) Hx of SHINGLES (ICD-053.9) VITAMIN B12 DEFICIENCY (ICD-266.2) VITAMIN D DEFICIENCY (ICD-268.9)  Review of Systems       Patient denies fever, chills, headache, sweats, rash, change in vision, change in hearing, chest pain, cough, nausea vomiting, urinary symptoms.  All other systems are reviewed and are negative.  Vital Signs:  Patient profile:   77 year old male Height:      72 inches Weight:      196.50 pounds Pulse rate:   60 / minute BP sitting:   112 / 70  (left arm) Cuff size:   regular  Vitals Entered By: Eliezer Lofts, EMT-P (July 17, 2009 11:35 AM)  Physical Exam  General:  patient is stable today. Head:  head is atraumatic. Eyes:  no xanthelasma. Neck:  no jugular venous distention. Chest Wall:  no chest wall tenderness. Lungs:  lungs are clear.  Respiratory effort is nonlabored. Heart:  cardiac exam is S1-S2.  No clicks or significant murmurs. Abdomen:  abdomen is soft.  He is wearing his brace. Msk:  no musculoskeletal deformities. Extremities:  1+ peripheral edema slightly greater in the right leg than the left leg Skin:    No skin rashes. Psych:  patient is oriented to person time and  place.  Affect is normal.    ICD Specifications Following MD:  Virl Axe, MD     ICD Vendor:  [employer] Number:  [id number]     ICD Serial Number:  [id number] ICD DOI:  09/07/2004     ICD Implanting MD:  Cristopher Peru, MD  Lead 1:    Location: RA     DOI: 09/07/2004     Model #: [id number]     Serial #: [id number]     Status: active Lead 2:    Location: RV     DOI: 09/07/2004     Model #: [id number]      Serial #: [id number]     Status: active Lead 3:    Location: LV     DOI: 10/11/2004     Model #: [id number]     Serial #: [id number]     Status: active  Indications::  ICM, CHF, MADIT-CRT   ICD Follow Up ICD Dependent:  No      Episodes Coumadin:  Yes  Brady Parameters Mode DDD     Lower Rate Limit:  60     Upper Rate Limit 140 PAV 60      Tachy Zones VF:  210     VT:  180     Impression & Recommendations:  Problem # 1:  * POSITIONAL DIZZINESS The dizziness is somewhat improved with the decrease in lisinopril dose.  No new change today.  Problem # 2:  CAD (ICD-414.00)  His updated medication list for this problem includes:    Bayer Aspirin Ec Low Dose 81 Mg Tbec (Aspirin) .[name]... Take 1 tablet by mouth once a day    Carvedilol 25 Mg Tabs (Carvedilol) .[name]... Take one tablet by mouth twice a day    Lisinopril 10 Mg Tabs (Lisinopril) .[name]... Take one tablet by mouth daily    Coumadin 5 Mg Tabs (Warfarin sodium) ..... Use as directed by anticoagulation clinic. Coronary disease is stable.  No further workup today.  Problem # 3:  CHRONIC SYSTOLIC HEART FAILURE (CZY-606.30)  His updated medication list for this problem includes:    Bayer Aspirin Ec Low Dose 81 Mg Tbec (Aspirin) .[name]... Take 1 tablet by mouth once a day    Carvedilol 25 Mg Tabs (Carvedilol) .[name]... Take one tablet by mouth twice a day    Lisinopril 10 Mg Tabs (Lisinopril) .[name]... Take one tablet by mouth daily    Lasix 40 Mg Tabs (Furosemide) .[name]... Take 1 tablet by mouth once a day    Coumadin 5 Mg Tabs (Warfarin sodium) ..... Use as directed by anticoagulation clinic.    Spironolactone 25 Mg Tabs (Spironolactone) .[name]... Take 1/2 tablet by mouth daily The patient has some increased edema today. I doubt that this is related to decreased lisinopril.  I considered pushing his Lasix dose up a little.  The patient is not on Aldactone.  He does not have a high potassium and his renal function is stable.  It will be safe to put him on it now.   Aldactone 12.5 mg daily will be started.  Chemistry labs will be checked in one week in one month.  Will also communicate with him about whether this has helped his edema along with his Lasix.  Problem # 4:  SINUS BRADYCARDIA (ICD-427.81)  His updated medication list for this problem includes:    Bayer Aspirin Ec Low Dose 81 Mg Tbec (Aspirin) .[name]... Take 1 tablet by mouth  once a day    Carvedilol 25 Mg Tabs (Carvedilol) .[name]... Take one tablet by mouth twice a day    Lisinopril 10 Mg Tabs (Lisinopril) .[name]... Take one tablet by mouth daily    Coumadin 5 Mg Tabs (Warfarin sodium) ..... Use as directed by anticoagulation clinic. Heart rate is stable today.  No change in therapy.  Problem # 5:  FATIGUE (ICD-780.79) This is a chronic problem.  I doubt that it is related to his cardiac status.  Problem # 6:  * COUMADIN LONG TERM The patient continues on Coumadin.  No change  Problem # 7:  HYPERLIPIDEMIA (ICD-272.4)  His updated medication list for this problem includes:    Simvastatin 20 Mg Tabs (Simvastatin) .[name]... Take one tablet by mouth daily at bedtime The patient's lipids are treated.  Patient Instructions: 1)  Hold potassium for now 2)  Start Spironolactone 41m 1/2 tab daily 3)  Your physician recommends that you return for lab work in: 1 week and in 1 month (bmet 276.6, 414.01) 4)  Follow up with Dr KRon Parkerin 4 weeks 5)  [facility] Prescriptions: SPIRONOLACTONE 25 MG TABS (SPIRONOLACTONE) Take 1/2 tablet by mouth daily  #30 x 6   Entered by:   HKevan Rosebush RN   Authorized by:   JLorenza Evangelist MD, F[facility]  Signed by:   HKevan Rosebush RN on 07/17/2009   Method used:   Electronically to        [facility]  [address] [facility][id number] (retail)       [address]      [place]  [address]      Ph: [phone]      Fax: [phone]  RxID:   [id number]

## 2010-03-02 NOTE — Medication Information (Signed)
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 no    Recent/future dental: no  Any missed doses?: no       Is patient compliant with meds? yes       Allergies: No Known Drug Allergies  Anticoagulation Management History:      The patient is taking warfarin and comes in today for a routine follow up visit.  Positive risk factors for bleeding include an age of 75 years or older.  The bleeding index is 'intermediate risk'.  Positive CHADS2 values include History of CHF, History of HTN, and Age > 75 years old.  The start date was 02/13/2000.  His last INR was 3.3 ratio.  Anticoagulation responsible provider: Praise Dolecki MD, Jeneen Rinks.  INR POC: 2.7.  Cuvette Lot#: [id number].  Exp: 06/2010.    Anticoagulation Management Assessment/Plan:      The patient's current anticoagulation dose is Coumadin 5 mg tabs: Use as directed by anticoagulation clinic..  The target INR is 2.0-3.0.  The next INR is due 05/19/2009.  Anticoagulation instructions were given to patient.  Results were reviewed/authorized by Margaretha Sheffield, PharmD.  He was notified by Margaretha Sheffield.         Prior Anticoagulation Instructions: INR 2.1  Resume previous dosage 3 tablets (29m) daily.  Recheck in 2 weeks.    Current Anticoagulation Instructions: INR 2.7  Continue taking 3 tablets daily.  Return to clinic in 4 weeks.

## 2010-03-02 NOTE — Assessment & Plan Note (Signed)
Summary: PER CHECK OUT/SF      Allergies Added: NKDA  Visit Type:  Follow-up Primary Provider:  Teressa Lower, MD  CC:  cardiomyopathy.  History of Present Illness: The patient is seen for followup his cardiomyopathy.  I saw him last August 13 2009 has not had any recurrent dizziness.  Potassium was checked that day.  The level was 4.0.  Creatinine was 1.0.  He is now on Aldactone 25 mg daily.  He is doing very well.  Chemistry will be checked today.  The patient has New [place] Heart Association class II systolic congestive heart failure.  It is stable.  Current Medications (verified): 1)  Bayer Aspirin Ec Low Dose 81 Mg  Tbec (Aspirin) .... Take 1 Tablet By Mouth Once A Day 2)  Carvedilol 25 Mg Tabs (Carvedilol) .... Take One Tablet By Mouth Twice A Day 3)  Lisinopril 10 Mg Tabs (Lisinopril) .... Take One Tablet By Mouth Daily 4)  Lasix 40 Mg  Tabs (Furosemide) .... Take 1 Tablet By Mouth Once A Day 5)  Spironolactone 25 Mg Tabs (Spironolactone) .... Take 1 Tablet By Mouth Daily 6)  Simvastatin 20 Mg Tabs (Simvastatin) .... Take One Tablet By Mouth Daily At Bedtime 7)  Mercaptopurine 50 Mg  Tabs (Mercaptopurine) .... Take 1.5 Tabs By Mouth Once Daily As Directed By Drchowdhury... 8)  Vitamin B-12 1000 Mcg Tabs (Cyanocobalamin) .... Take One Tablet By Mouth Once Daily. 9)  Folic Acid 1 Mg  Tabs (Folic Acid) .... Take 1 Tablet By Mouth Once A Day 10)  Vitamin D 1000 Unit Tabs (Cholecalciferol) .... Take 1-2 Tablets By Mouth Once Daily 11)  Coumadin 5 Mg Tabs (Warfarin Sodium) .... Use As Directed By Anticoagulation Clinic.  Allergies (verified): No Known Drug Allergies  Past History:  Past Medical History: Positional dizziness.... may, 2011 HYPERTENSION (ICD-401.9) CARDIOMYOPATHY, ISCHEMIC (ICD-414.8) CAD CABG 1994 CHF.... chronic systolic  EF 19%... echo... November, 2009 Bradycardia.... 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) LBBB  (ICD-426.3) HYPERLIPIDEMIA (ICD-272.4) GERD (ICD-530.81) COLONIC POLYPS (ICD-211.3) CROHN'S DISEASE (ICD-555.9) Hx of ACUTE VASCULAR INSUFFICIENCY OF INTESTINE (ICD-557.0) VENTRAL HERNIA (ICD-553.20) HYPERTROPHY PROSTATE W/UR OBST & OTH LUTS (ICD-600.01) DEGENERATIVE JOINT DISEASE (ICD-715.90) Hx of POLYMYALGIA RHEUMATICA (ICD-725) TREMOR (ICD-781.0) ANXIETY (ICD-300.00) Hx of SHINGLES (ICD-053.9) VITAMIN B12 DEFICIENCY (ICD-266.2) VITAMIN D DEFICIENCY (ICD-268.9)  Review of Systems       Patient denies fever, chills, headache, sweats, rash, change in vision, change in hearing, chest pain, cough, nausea vomiting, urinary symptoms.  All other systems are reviewed and are negative.  Vital Signs:  Patient profile:   77 year old male Height:      72 inches Weight:      200 pounds BMI:     27.22 Pulse rate:   65 / minute BP sitting:   106 / 64  (right arm) Cuff size:   regular  Vitals Entered By: Mignon Pine, RMA (September 22, 2009 10:59 AM)  Physical Exam  General:  he looks good. Eyes:  no xanthelasma. Neck:  no jugular venous distention. Lungs:  lungs are clear respiratory effort is nonlabored. Heart:  cardiac exam reveals S1-S2.  No clicks or significant murmurs. Abdomen:  abdomen is soft. Extremities:  no peripheral edema. Psych:  patient is oriented to person time and place.  Affect is normal.    ICD Specifications Following MD:  Virl Axe, MD     ICD Vendor:  [place] Number:  [phone]  ICD Serial Number:  [id number] ICD DOI:  09/07/2004     ICD Implanting MD:  Cristopher Peru, MD  Lead 1:    Location: RA     DOI: 09/07/2004     Model #: [id number]     Serial #: [id number]     Status: active Lead 2:    Location: RV     DOI: 09/07/2004     Model #: [id number]     Serial #: [id number]     Status: active Lead 3:    Location: LV     DOI: 10/11/2004     Model #: [id number]     Serial #: [id number]     Status: active  Indications::  ICM, CHF, MADIT-CRT   ICD Follow Up ICD  Dependent:  No      Episodes Coumadin:  Yes  Brady Parameters Mode DDD     Lower Rate Limit:  60     Upper Rate Limit 140 PAV 60      Tachy Zones VF:  210     VT:  180     Impression & Recommendations:  Problem # 1:  CORONARY ATHEROSCLEROSIS NATIVE CORONARY ARTERY (ICD-414.01)  His updated medication list for this problem includes:    Bayer Aspirin Ec Low Dose 81 Mg Tbec (Aspirin) .[name]... Take 1 tablet by mouth once a day    Carvedilol 25 Mg Tabs (Carvedilol) .[name]... Take one tablet by mouth twice a day    Lisinopril 10 Mg Tabs (Lisinopril) .[name]... Take one tablet by mouth daily    Coumadin 5 Mg Tabs (Warfarin sodium) ..... Use as directed by anticoagulation clinic. Coronary disease is stable.  No change in therapy.  Problem # 2:  * POSITIONAL DIZZINESS There is no recurrent dizziness.  Patient is stable.  Problem # 3:  CHRONIC SYSTOLIC HEART FAILURE (ZLD-357.01)  His updated medication list for this problem includes:    Bayer Aspirin Ec Low Dose 81 Mg Tbec (Aspirin) .[name]... Take 1 tablet by mouth once a day    Carvedilol 25 Mg Tabs (Carvedilol) .[name]... Take one tablet by mouth twice a day    Lisinopril 10 Mg Tabs (Lisinopril) .[name]... Take one tablet by mouth daily    Lasix 40 Mg Tabs (Furosemide) .[name]... Take 1 tablet by mouth once a day    Spironolactone 25 Mg Tabs (Spironolactone) .[name]... Take 1 tablet by mouth daily    Coumadin 5 Mg Tabs (Warfarin sodium) ..... Use as directed by anticoagulation clinic. The patient is class II.  He is now on full dose of all medications.  I will not be changing his meds today.  I'll see him back in 6 months for followup.  Chemistry will be checked today on full dose Aldactone.  Other Orders: TLB-BMP (Basic Metabolic Panel-BMET) ([id number])  Patient Instructions: 1)  Lab today 2)  Your physician wants you to follow-up in:  6 months. You will receive a reminder letter in the mail two months in advance. If you don't receive a letter, please call our  office to schedule the follow-up appointment.

## 2010-03-02 NOTE — Assessment & Plan Note (Signed)
Summary: Cardiology Nuclear Testing  Nuclear Med Background Indications for Stress Test: Evaluation for Ischemia, Surgical Clearance, Graft Patency  Indications Comments: ICD Implantation 11/27/2009  History: CABG, Defibrillator, Echo, Heart Catheterization, Myocardial Infarction, Myocardial Perfusion Study  History Comments: 1994 MI >CABG x4.                                        '03 MPS: EF 37% no significant ischemia, prior infarction of anterior,apical,and inferior. 2006 Defibrilator. '09 Echo: EF=35%. Hx. ICM,CHF.   2006 Heart Cath total RCA 2009 ECHO EF 35%   Symptoms: Chest Pain, Dizziness, DOE    Nuclear Pre-Procedure Cardiac Risk Factors: Family History - CAD, History of Smoking, Hypertension, LBBB, Lipids Caffeine/Decaff Intake: none NPO After: 6:00 PM Lungs: Clear IV 0.9% NS with Angio Cath: 22g     IV Site: R Hand IV Started by: Annye Rusk, CNMT Chest Size (in) 42     Height (in): 72 Weight (lb): 194 BMI: 26.41 Tech Comments: held carvedilol this am  Nuclear Med Study 1 or 2 day study:  1 day     Stress Test Type:  Carlton Adam Reading MD:  Dorris Carnes, MD     Referring MD:  Dola Argyle MD Resting Radionuclide:  Technetium 14mTetrofosmin     Resting Radionuclide Dose:  11 mCi  Stress Radionuclide:  Technetium 942metrofosmin     Stress Radionuclide Dose:  33 mCi   Stress Protocol      Max HR:  66 bpm Max Systolic BP: 13409m HgRate Pressure Product:  8580  Lexiscan: 0.4 mg   Stress Test Technologist:  PaIrven Baltimore RN     Nuclear Technologist:  StCharlton AmorCNMT  Rest Procedure  Myocardial perfusion imaging was performed at rest 45 minutes following the intravenous administration of Technetium 9914mtrofosmin.  Stress Procedure  The patient received IV Lexiscan 0.4 mg over 15-seconds.  Technetium 61m44mrofosmin injected at 30-seconds.The baseline EKG was V-Pacing with nonspecific T- wave changes.  There were no significant changes with lexiscan,  frequent PVC's, Begiminy and trigeminy PVC's.  Quantitative spect images were obtained after a 45 minute delay.  QPS Raw Data Images:  Soft tissue (diaphragm, bowel activity) underlie the inferior wall. Stress Images:  Large defect in the anterior (base, mid, distal), anterolateral (mid, distal), anteroseptal (mid, distal) and apical walls. Rest Images:  Minimal improvemnt in the anterolateral base otherwise no significant change from the stress images. Subtraction (SDS):  No evidence of ischemia. Transient Ischemic Dilatation:  .98  (Normal <1.22)  Lung/Heart Ratio:  .39  (Normal <0.45)  Quantitative Gated Spect Images QGS EDV:  211 ml QGS ESV:  140 ml QGS EF:  34 %   Overall Impression  Exercise Capacity: Lexiscan with no exercise. BP Response: Normal blood pressure response. Clinical Symptoms: No chest pain ECG Impression: Nondiagnostic because of pacing. Overall Impression Comments: Extensive scar in the anterior, anterolateral, anteroseptal and apical distributions.  No evidence for significant ischemia.  LVEF calculated at 34%.  Appended Document: Cardiology Nuclear Testing results reviewed. RhonJoan Flores, BSN

## 2010-03-02 NOTE — Medication Information (Signed)
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 no    Recent/future dental: no  Any missed doses?: no       Is patient compliant with meds? yes       Allergies: No Known Drug Allergies  Anticoagulation Management History:      The patient is taking warfarin and comes in today for a routine follow up visit.  Positive risk factors for bleeding include an age of 75 years or older.  The bleeding index is 'intermediate risk'.  Positive CHADS2 values include History of CHF, History of HTN, and Age > 5 years old.  The start date was 02/13/2000.  His last INR was 1.8 ratio.  Anticoagulation responsible Shahan Starks: Percival Spanish MD, Jeneen Rinks.  INR POC: 1.7.  Cuvette Lot#: [id number].  Exp: 12/2010.    Anticoagulation Management Assessment/Plan:      The patient's current anticoagulation dose is Coumadin 5 mg tabs: Use as directed by anticoagulation clinic..  The target INR is 2.0-3.0.  The next INR is due 12/18/2009.  Anticoagulation instructions were given to patient.  Results were reviewed/authorized by Tula Nakayama, RN, BSN.  He was notified by Tula Nakayama, RN, BSN.         Prior Anticoagulation Instructions: INR 1.4 Today and tomorrow take 4 tablets. Then go back to normal dosing schedule of 3 tablets everyday except 4 tablets on monday. Recheck in 1 week.   Current Anticoagulation Instructions: INR 1.7 Today take 4 tablets then resume 3 tablets everyday except 4 tablets on Mondays. Recheck in week.

## 2010-03-02 NOTE — Procedures (Signed)
Summary: DEVICE/SAF      Allergies Added: NKDA  Current Medications (verified): 1)  Bayer Aspirin Ec Low Dose 81 Mg  Tbec (Aspirin) .... Take 1 Tablet By Mouth Once A Day 2)  Carvedilol 25 Mg Tabs (Carvedilol) .... Take One Tablet By Mouth Twice A Day 3)  Lisinopril 10 Mg Tabs (Lisinopril) .... Take One Tablet By Mouth Daily 4)  Lasix 40 Mg  Tabs (Furosemide) .... Take 1 Tablet By Mouth Once A Day 5)  Spironolactone 25 Mg Tabs (Spironolactone) .... Take 1 Tablet By Mouth Daily 6)  Simvastatin 20 Mg Tabs (Simvastatin) .... Take One Tablet By Mouth Daily At Bedtime 7)  Mercaptopurine 50 Mg  Tabs (Mercaptopurine) .... Take 1.5 Tabs By Mouth Once Daily As Directed By Drchowdhury... 8)  Vitamin B-12 1000 Mcg Tabs (Cyanocobalamin) .... Take One Tablet By Mouth Once Daily. 9)  Folic Acid 1 Mg  Tabs (Folic Acid) .... Take 1 Tablet By Mouth Once A Day 10)  Vitamin D 1000 Unit Tabs (Cholecalciferol) .... Take 1-2 Tablets By Mouth Once Daily 11)  Coumadin 5 Mg Tabs (Warfarin Sodium) .... Use As Directed By Anticoagulation Clinic.  Allergies (verified): No Known Drug Allergies   ICD Specifications Following MD:  Virl Axe, MD     ICD Vendor:  [place] Number:  [id number]     ICD Serial Number:  [id number] ICD DOI:  09/07/2004     ICD Implanting MD:  Cristopher Peru, MD  Lead 1:    Location: RA     DOI: 09/07/2004     Model #: 1937     Serial #: [id number]     Status: active Lead 2:    Location: RV     DOI: 09/07/2004     Model #: 7353     Serial #: [id number]     Status: active Lead 3:    Location: LV     DOI: 10/11/2004     Model #: [id number]     Serial #: [id number]     Status: active  Indications::  ICM, CHF, MADIT-CRT   ICD Follow Up Battery Voltage:  2.49 V     Charge Time:  11.8 seconds     Underlying rhythm:  SR ICD Dependent:  No       ICD Device Measurements [place]:  Amplitude: 5.8 mV, Impedance: 541 ohms, Threshold: 0.8 V at 0.5 msec Right Ventricle:  Amplitude: 21.6 mV,  Impedance: 712 ohms, Threshold: 0.6 V at 0.5 msec Left Ventricle:  Amplitude: 14.0 mV, Impedance: 403 ohms, Threshold: 0.8 V at 0.8 msec Shock Impedance: 45 ohms   Episodes MS Episodes:  0     Coumadin:  Yes Shock:  0     ATP:  0     Nonsustained:  0     Atrial Therapies:  0 Atrial Pacing:  61%     Ventricular Pacing:  98%  Brady Parameters Mode DDD     Lower Rate Limit:  60     Upper Rate Limit 140 PAV 60      Tachy Zones VF:  210     VT:  180     Next Remote Date:  01/07/2010     Tech Comments:  BATTERY VOLTAGE 2.49--MONITOR W/LATITUDE TRANSMISSIONS AND PT ALSO AWARE OF BEEPING WHEN REACHES ERI.  PT KNOWS TO CALL IF BEEPING IS HEARD.  NORMAL DEVICE FUNCTION.  NO CHANGES MADE.  LATITUDE TRANSMISSION 01-07-10. Shelly Bombard  October 07, 2009 12:42 PM

## 2010-03-02 NOTE — Medication Information (Signed)
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 no    Recent/future dental: no  Any missed doses?: no       Is patient compliant with meds? yes       Current Medications (verified): 1)  Bayer Aspirin Ec Low Dose 81 Mg  Tbec (Aspirin) .... Take 1 Tablet By Mouth Once A Day 2)  Carvedilol 25 Mg Tabs (Carvedilol) .... Take One Tablet By Mouth Twice A Day 3)  Lisinopril 10 Mg Tabs (Lisinopril) .... Take One Tablet By Mouth Daily 4)  Lasix 40 Mg  Tabs (Furosemide) .... Take 1 Tablet By Mouth Once A Day 5)  Spironolactone 25 Mg Tabs (Spironolactone) .... Take 1 Tablet By Mouth Daily 6)  Simvastatin 20 Mg Tabs (Simvastatin) .... Take One Tablet By Mouth Daily At Bedtime 7)  Mercaptopurine 50 Mg  Tabs (Mercaptopurine) .... Take 1.5 Tabs By Mouth Once Daily As Directed By Drchowdhury... 8)  Vitamin B-12 1000 Mcg Tabs (Cyanocobalamin) .... Take One Tablet By Mouth Once Daily. 9)  Folic Acid 1 Mg  Tabs (Folic Acid) .... Take 1 Tablet By Mouth Once A Day 10)  Vitamin D 1000 Unit Tabs (Cholecalciferol) .... Take 1-2 Tablets By Mouth Once Daily 11)  Coumadin 5 Mg Tabs (Warfarin Sodium) .... Use As Directed By Anticoagulation Clinic.  Allergies (verified): No Known Drug Allergies  Anticoagulation Management History:      The patient is taking warfarin and comes in today for a routine follow up visit.  Positive risk factors for bleeding include an age of 40 years or older.  The bleeding index is 'intermediate risk'.  Positive CHADS2 values include History of CHF,  History of HTN, and Age > 18 years old.  The start date was 02/13/2000.  His last INR was 3.3 ratio.  Anticoagulation responsible provider: Burt Knack MD, Legrand Como.  INR POC: 2.0.  Cuvette Lot#: [id number].  Exp: 10/2010.    Anticoagulation Management Assessment/Plan:      The patient's current anticoagulation dose is Coumadin 5 mg tabs: Use as directed by anticoagulation clinic..  The target INR is 2.0-3.0.  The next INR is due 10/15/2009.  Anticoagulation instructions were given to patient.  Results were reviewed/authorized by Bonnita Nasuti, PharmD, BCPS, CPP.         Prior Anticoagulation Instructions: INR 2.1  Continue on same dosage 3 tablets daily except 4 tablets on Mondays.  Recheck in 3 weeks.    Current Anticoagulation Instructions: INR 2.0  Coumadin 52m tab take 3 tablets each day except 4 tab on Mon

## 2010-03-02 NOTE — Medication Information (Signed)
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 no    Recent/future dental: no  Any missed doses?: no       Is patient compliant with meds? yes       Allergies: No Known Drug Allergies  Anticoagulation Management History:      The patient is taking warfarin and comes in today for a routine follow up visit.  Positive risk factors for bleeding include an age of 75 years or older.  The bleeding index is 'intermediate risk'.  Positive CHADS2 values include History of CHF, History of HTN, and Age > 64 years old.  The start date was 02/13/2000.  His last INR was 3.3 ratio.  Anticoagulation responsible Cortez Steelman: Angelena Form MD, Harrell Gave.  INR POC: 2.7.  Cuvette Lot#: [id number].  Exp: 12/2010.    Anticoagulation Management Assessment/Plan:      The patient's current anticoagulation dose is Coumadin 5 mg tabs: Use as directed by anticoagulation clinic..  The target INR is 2.0-3.0.  The next INR is due 12/02/2009.  Anticoagulation instructions were given to patient.  Results were reviewed/authorized by Tula Nakayama, RN, BSN.  He was notified by Tula Nakayama, RN, BSN.         Prior Anticoagulation Instructions: INR 2.1  Continue taking 3 tablets every day except take 4 tablets on Mondays. Re-check INR in 4 weeks.   Current Anticoagulation Instructions: INR 2.7 Continue 3 pills everyday except 4 pills on Mondays. Recheck in 4 weeks.

## 2010-03-02 NOTE — Medication Information (Signed)
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 no    Recent/future dental: no  Any missed doses?: no       Is patient compliant with meds? yes      Comments: Pt lost his dog recently, incr stress.  Allergies: No Known Drug Allergies  Anticoagulation Management History:      The patient is taking warfarin and comes in today for a routine follow up visit.  Positive risk factors for bleeding include an age of 75 years or older.  The bleeding index is 'intermediate risk'.  Positive CHADS2 values include History of CHF, History of HTN, and Age > 80 years old.  The start date was 02/13/2000.  His last INR was 3.3 ratio.  Anticoagulation responsible provider: Caryl Comes MD, Remo Lipps.  INR POC: 1.7.  Cuvette Lot#: [id number].  Exp: 10/2010.    Anticoagulation Management Assessment/Plan:      The patient's current anticoagulation dose is Coumadin 5 mg tabs: Use as directed by anticoagulation clinic..  The target INR is 2.0-3.0.  The next INR is due 08/27/2009.  Anticoagulation instructions were given to patient.  Results were reviewed/authorized by Freddrick March, RN, BSN.  He was notified by Freddrick March RN.         Prior Anticoagulation Instructions: INR 2.1  Continue same dose of 3 tablets every day.  Recheck in 4 weeks.   Current Anticoagulation Instructions: INR 1.7  Take 4 tablets today, then start taking 3 tablets daily except 4 tablets on Mondays.  Recheck in 2-3 weeks.

## 2010-03-02 NOTE — Assessment & Plan Note (Signed)
Summary: FEVER/ SINUS INFECTION PER FAMILY/kp   Primary Care Provider:  Teressa Lower, MD  CC:  Add-on for persistant bronchitis....  History of Present Illness: [name] here for a follow up visit... he has multiple medical problems as noted below...     ~  [place]owdhury [place] w/ sm bowel Crohn's- s/p mult resections w/ complications, stable on 6-MP for prevention of recurrence... also has sl incr fasting bili c/w Gilbert's...  ~  followed by [name] for Cards- w/ CAD, s/p CABG, Cardiomyopathy w/ AICD- stable... tried decr Coreg due to side effects but no different, therefore back up to 12.24mBid... f/u 2DEcho 11/09 showed diffuseHK w/ EF= 35% & DD, mod incr AoV thickness w/o AS... EKG= pacer rhythm.   ~  March 22, 2008:  he had URI in Dec Rx w/ Augmentin by TP and improved... he complains of his nose running clear water when he is in & out of the cold air... his CC is intermittent "shaking" of his hands/ head/ etc... occas hard to eat because of the tremor but it is quite intermittent... ** we discussed referral to neurology for further eval of this problem>>> saw DrYan 3/10 w/ Dx essential tremor (& +FamHx of same); trial Clonazepam 0.239mBid...  labs showed B12= 215, & VitD= 23 >> started oral B12 & VitD supplements.   ~  November 06, 2008:  he's had a good 8 months- saw DrKatz 5/10 & DrKlein 8/10: cardiomyopathy stable & compensated, ICD functioning well, but couldn't up titrate the Coreg due to bradycardia...  he also saw DrNesi 7/10 w/ BPH, obstructive symptoms, and ED- given  Flomax & "shots"... still followed regularly by GI- [place]Chowdhury [place]'s on 6MP... pt c/o some nausea and has f/u appt next week... we will f/u labs...   ~  December 02, 2008:  add-on for sinusitis- c/o 1 wk hx URI "cold" taking OTC Mucinex, then yellow drainage, fever low grade & sweats, right maxillary pain & congestion... exam w/ max tenderness & XRay w/ mucosal thickening & AF level on  right- discussed Rx w/ Augmentin, Depo, Dosepak >> improved...   ~  March 31, 2009:  Add-on for persistant symptoms- had URI w/ fever, feeling bad, ?some confusion, HA, sinusitis symptoms and bronchitic symptoms w/ cough, brown phlegm, congestion, etc... he refused to go to the ER- was given Avelox & Hydromet cough syrup... only min better & family concerned- he's been taking MucinexD & I told them to stop all "D" preps- no pseudophed allowed... we decided to check CXR, Labs, & Rx w/ Augmentin, Depo/ Dosepak, Mucinex Max Bid, Fluids, etc... short term f/u.   Current Problems:   HBP, CAD, Cardiomyopathy - followed by DrBenay Spicend DrTaylor... he had CABG in 1994 (with cath in 96 revealing a total right and an 80% OM, vein graft to the diagonal was occluded, other bypasses were patent), Biv AICD in 2006... currently taking COUMADIN (follow in clinic), ASA 8165m,  COREG 12.5mg37m,  LISINOPRIL 40mg70m LASIX 40mg/23mK20 1/d... doing reasonably well on Rx- last Cardiolite 3/03 w/ EF=37%... his last cath was 6/06 w/ native vessel and graft disease... last 2DEcho 11/09 stable w/ mod LVD- diffuse HK w/ EF= 35%, DD, mod incr AoV thickness w/o AS...   ~  11/10: saw DrKatz for f/u & stable- same meds, no changes made.  ~  2/11: saw DrKlein for f/u AICD- stable, no chnages made.  HYPERLIPIDEMIA - he  takes SIMVASTATIN 75m/d... he's had min, non-progressive incr LFTs noted.  ~  [place]/08 showed TChol 138, TG 138, HDL 38, LDL 72... tolerating med well.  ~  FM[place]4/09 showed TChol 118, TG 111, HDL 33, LDL 63  ~  FLP 12/09 showed TChol 118, TG 100, HDL 31, LDL 67  ~  FLP 10/10 showed TChol 111, TG 139, HDL 36, LDL 47  GERD (ICD-530.81) - no active symptoms and not currently taking PPI etc.  COLONIC POLYPS (ICD-211.3) - last colonoscopy here was 7/04 by DDemetra Shinerw/ divertics and several polyps from 6 - 27msize, reportedly adenomatous on bx but I cannot find the path reports... he is overdue for f/u colon...  ~   labs 2/10 showed Hg= 15.7...Marland KitchenMarland Kitchene will send copy of note to DrChowdhury w/ request for f/u colonoscopy.  ~  f/u colonoscopy 11/10 by DrChowghury showed norm TI & several polyps removed- path +tubular adenoma...  CROHN'S DISEASE (ICD-555.9) - he is [place]owdhury [place] 6-MP 5023mabs- 1.5 tabs/d... he notes intermittent diarrhea and was started on Questran Prn...  Hx of ACUTE VASCULAR INSUFFICIENCY OF INTESTINE (ICD-557.0) - he has embolectomy of the superior mesenteric artery 1/03 by DrLSheryn Bisonong hospitalization w/ complications)... he has remained on Coumadin in the coumadin clinic ever since then...  VENTRAL HERNIA (ICD-553.20) - he is s/p left inguinal hernia repair 6/08 by DrIClarita Lebernd had a prev incarcerated ventral hernia repaired 11/03... his residual/ recurrent ventral hernia has been evaluated by DrIClarita Lebero [place] surg... currently he wears an abd binder when up and about...  HYPERTROPHY PROSTATE W/UR OBST & OTH LUTS (ICD-600.01) - saw DrNesi 7/10 w/ BPH, obstructive symptoms, and ED- given  Flomax & "shots"...   DEGENERATIVE JOINT DISEASE (ICD-715.90)  Hx of POLYMYALGIA RHEUMATICA (ICD-725) - hasn't req steroids x yrs...  ~  labs 2/10 showed Sed= 8  ~  labs 10/10 showed Sed= 6  ~  labs 3/11 showed Sed= 51 w/ acute illness.  TREMOR (ICD-781.0) - eval 3/10 by DrYDalphine HandinguilfordNeurology- essential tremor w/ +FamHx in sister... trial Clonazepam 0.42m72md w/ improvement...  ANXIETY (ICD-300.00) - he uses the same Clonazepam 0.42mg26m...  Hx of SHINGLES (ICD-053.9)  VITAMIN B12 DEFICIENCY (ICD-266.2) - on B 12 1000mcg73mTC...  ~  labs 2/10 showed B12 level = 215... rec start oral B12 supplement daily.  ~  labs 10/10 showed B12 level = 416... continue oral B12 supplement.  VITAMIN D DEFICIENCY (ICD-268.9) - on Vit D 1000 u daily OTC...  ~  labs 2/10 showed Vit D level = 23... rec> start Vit D 1000 u  daily...   Allergies (verified): No Known Drug Allergies  Comments:  Nurse/Medical Assistant: The patient's medications and allergies were reviewed with the patient and were updated in the Medication and Allergy Lists.  Past History:  Past Medical History:  HYPERTENSION (ICD-401.9) CARDIOMYOPATHY, ISCHEMIC (ICD-414.8) CAD CABG 1994 CHF.... chronic systolic  EF 35%...16%ho... November, 2009 Bradycardia.... 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) LBBB (ICD-426.3) HYPERLIPIDEMIA (ICD-272.4) GERD (ICD-530.81) COLONIC POLYPS (ICD-211.3) CROHN'S DISEASE (ICD-555.9) Hx of ACUTE VASCULAR INSUFFICIENCY OF INTESTINE (ICD-557.0) VENTRAL HERNIA (ICD-553.20) HYPERTROPHY PROSTATE W/UR OBST & OTH LUTS (ICD-600.01) DEGENERATIVE JOINT DISEASE (ICD-715.90) Hx of POLYMYALGIA RHEUMATICA (ICD-725) TREMOR (ICD-781.0) ANXIETY (ICD-300.00) Hx of SHINGLES (ICD-053.9) VITAMIN B12 DEFICIENCY (ICD-266.2) VITAMIN D DEFICIENCY (ICD-268.9)  Past Surgical History: S/P CABG in 1994 S/P laparoscopic cholecystectomy in 2002 S/P ELap  w/ superior mesenteric artery [facility]lectomy by [facility] 1/03 S/P repair of incarcerated ventral hernia & lysis of adhesions 11/03 by DrHIngram S/P implantation of biventric cardioverter-defibrillator by DrTaylor in 8/06-[employer] Contak H170 S/P left inguinal hernia repair w/ mesh 6/08 by Joaquin Bend  Family History: Reviewed history from 11/06/2008 and no changes required. Coronary Heart Disease Hypertension Hyperlipidemia Sister had tremor...  Social History: Reviewed history from 03/26/2009 and no changes required. Married-3 children former smoker, quit in 1975 x47yr 1ppd. Social alcohol Employ- bulldozer driver  Review of Systems      See HPI       The patient complains of hoarseness, dyspnea on exertion, and prolonged cough.  The patient denies anorexia, fever, weight loss, weight gain, vision loss, decreased hearing, chest  pain, syncope, peripheral edema, headaches, hemoptysis, abdominal pain, melena, hematochezia, severe indigestion/heartburn, hematuria, incontinence, muscle weakness, suspicious skin lesions, transient blindness, difficulty walking, depression, unusual weight change, abnormal bleeding, enlarged lymph nodes, and angioedema.    Vital Signs:  Patient profile:   77year old male Height:      72 inches Weight:      187.13 pounds O2 Sat:      97 % on Room air Temp:     97.5 degrees F oral Pulse rate:   84 / minute BP sitting:   128 / 80  (left arm) Cuff size:   regular  Vitals Entered By: LElita BooneCMA (March 31, 2009 12:16 PM)  O2 Sat at Rest %:  97 O2 Flow:  Room air CC: Add-on for persistant bronchitis... Is Patient Diabetic? No Pain Assessment Patient in pain? no      Comments meds updated today   Physical Exam  Additional Exam:  WD, WN, 77y/o WM in NAD... GENERAL:  Alert & oriented; pleasant & cooperative... HEENT:  [place]/AT, EOM-wnl, PERRLA, EACs-clear, TMs-wnl, NOSE- sl red/ inflamm, THROAT-clear & wnl. NECK:  Supple w/ fairROM; no JVD; normal carotid impulses w/o bruits; no thyromegaly or nodules palpated; no lymphadenopathy. CHEST:  Decr BS w/ few scat rhonchi, no wheezing, no rales, no signs of consolidation. HEART:  Regular Rhythm; gr 1/6 SEM without rubs or gallops detected... ABDOMEN:  Soft & nontender; mult scars, & ventral hernia; normal bowel sounds; no organomegaly or masses palpated. EXT: without deformities, mod arthritic changes; no varicose veins/ venous insuffic/ or edema. NEURO:  CN's intact; I do not apprec a tremor at present; no focal neuro deficits... DERM:  No lesions noted; no rash etc...    CXR  Procedure date:  03/31/2009  Findings:      [facility] - [facility].   Findings: Again noted anatomic variant azygos lobe and perihilar peribronchial cuffing of chronic asthma or  bronchitis.  Lungs are otherwise clear.  Heart size is currently normal with stable post CABG changes and AICD lead placement.   IMPRESSION:   1.  Stable CABG / AICD, and chronic asthma or bronchitis. 2.  No active cardiopulmonary disease.   Read By:  PSydnee Levans  M.D.   MISC. Report  Procedure date:  03/31/2009  Findings:      BMP (METABOL)   Sodium                    138 mEq/L                   135-145   Potassium  4.8 mEq/L                   3.5-5.1   Chloride                  98 mEq/L                    96-112   Carbon Dioxide       [H]  34 mEq/L                    19-32   Glucose              [H]  106 mg/dL                   70-99   BUN                       15 mg/dL                    6-23   Creatinine                1.0 mg/dL                   0.4-1.5   Calcium                   9.4 mg/dL                   8.4-10.5   GFR                       77.25 mL/min                >60  Hepatic/Liver Function Panel (HEPATIC)   Total Bilirubin      [H]  1.6 mg/dL                   0.3-1.2   Direct Bilirubin     [H]  0.5 mg/dL                   0.0-0.3   Alkaline Phosphatase      59 U/L                      39-117   AST                  [H]  65 U/L                      0-37   ALT                  [H]  69 U/L                      0-53   Total Protein             6.8 g/dL                    6.0-8.3   Albumin              [L]  3.2 g/dL                    3.5-5.2  CBC Platelet w/Diff (CBCD)   White Cell Count          6.5 K/uL  4.5-10.5   Red Cell Count       [L]  4.15 Mil/uL                 4.22-5.81   Hemoglobin                14.1 g/dL                   13.0-17.0   Hematocrit                42.2 %                      39.0-52.0   MCV                  [H]  101.8 fl                    78.0-100.0   Platelet Count            223.0 K/uL                  150.0-400.0   Neutrophil %              60.3 %                      43.0-77.0   Lymphocyte %               23.7 %                      12.0-46.0   Monocyte %           [H]  14.5 %                      3.0-12.0   Eosinophils%              1.0 %                       0.0-5.0   Basophils %               0.5 %                       0.0-3.0  Comments:      TSH (TSH)   FastTSH                   0.64 uIU/mL                 0.35-5.50  Tests: (5) PT (PTP)   Prothrombin Time     [H]  33.5 sec                    9.1-11.7   INR                  [H]  3.3 ratio                   0.8-1.0  Tests: (6) Sed Rate (ESR)   Sed Rate             [H]  51 mm/hr                    0-22   Impression & Recommendations:  Problem # 1:  BRONCHITIS, ACUTE (ICD-466.0) Acute illness but CBC is OK, CXR clear, other labs w/ incr  LFTs/ prolonged protime... REC>  Rx w/ Augmentin, Mucinex, Depo80/ Dosepak, w/ f/u planned...  The following medications were removed from the medication list:    Avelox 400 Mg Tabs (Moxifloxacin hcl) .[name]... 1 by mouth once daily His updated medication list for this problem includes:    Hydromet 5-1.5 Mg/5m Syrp (Hydrocodone-homatropine) ..[name].. 1-2 tsp every 4-6 hrs as needed cough/congestion , may make you sleepy    Augmentin 875-125 Mg Tabs (Amoxicillin-pot clavulanate) ..[name].. Take 1 tab by mouth two times a day til gone    Mucinex Maximum Strength 1200 Mg Xr12h-tab (Guaifenesin) ..[name].. Take 1 tab by mouth two times a day  Orders: T-2 View CXR (71020TC) TLB-BMP (Basic Metabolic Panel-BMET) (816109-UEAVWUJ TLB-Hepatic/Liver Function Pnl (80076-HEPATIC) TLB-CBC Platelet - w/Differential (85025-CBCD) TLB-TSH (Thyroid Stimulating Hormone) (84443-TSH) TLB-PT (Protime) (85610-PTP) TLB-Sedimentation Rate (ESR) (85652-ESR) Depo- Medrol 846m(J1040) Admin of Therapeutic Inj  intramuscular or subcutaneous (9(81191 Problem # 2:  * COUMADIN LONG TERM Protime called to CC to adjust & notify pt...  Problem # 3:  HYPERTENSION (ICD-401.9) Controlled-  same meds. His updated medication list  for this problem includes:    Carvedilol 25 Mg Tabs (Carvedilol) ...[name]. Take one tablet by mouth twice a day    Lisinopril 20 Mg Tabs (Lisinopril) ...[name]. Take one tablet by mouth daily    Lasix 40 Mg Tabs (Furosemide) ...[name]. Take 1 tablet by mouth once a day  Problem # 4:  CARDIOMYOPATHY, ISCHEMIC (ICD-414.8) Followed by DrBenay Spice. His updated medication list for this problem includes:    Bayer Aspirin Ec Low Dose 81 Mg Tbec (Aspirin) ...[name]. Take 1 tablet by mouth once a day    Carvedilol 25 Mg Tabs (Carvedilol) ...[name]. Take one tablet by mouth twice a day    Lisinopril 20 Mg Tabs (Lisinopril) ...[name]. Take one tablet by mouth daily    Lasix 40 Mg Tabs (Furosemide) ...[name]. Take 1 tablet by mouth once a day  Problem # 5:  HYPERLIPIDEMIA (ICD-272.4) He is on low dose Simva w/ good control of lipids... note mild incr LFTs, not a progressive prob. His updated medication list for this problem includes:    Simvastatin 20 Mg Tabs (Simvastatin) ...[name]. Take one tablet by mouth daily at bedtime  Problem # 6:  CROHN'S DISEASE (ICD-555.9) [place]owdhury [place]...  Problem # 7:  MULT MEDICAL PROBLEMS AS NOTED>>>>  Complete Medication List: 1)  Bayer Aspirin Ec Low Dose 81 Mg Tbec (Aspirin) .... Take 1 tablet by mouth once a day 2)  Carvedilol 25 Mg Tabs (Carvedilol) .... Take one tablet by mouth twice a day 3)  Lisinopril 20 Mg Tabs (Lisinopril) .... Take one tablet by mouth daily 4)  Lasix 40 Mg Tabs (Furosemide) .... Take 1 tablet by mouth once a day 5)  Klor-con 20 Meq Pack (Potassium chloride) .... Take 1 tablet by mouth once a day 6)  Simvastatin 20 Mg Tabs (Simvastatin) .... Take one tablet by mouth daily at bedtime 7)  Mercaptopurine 50 Mg Tabs (Mercaptopurine) .... Take 1.5 tabs by mouth once daily as directed by drchowdhury... 8)  Vitamin B-12 1000 Mcg Tabs (Cyanocobalamin) .... Take one tablet by mouth once daily. 9)  Folic Acid 1 Mg Tabs (Folic acid) .... Take 1 tablet by mouth once a  day 10)  Vitamin D 1000 Unit Tabs (Cholecalciferol) .... Take 1-2 tablets by mouth once daily 11)  Clonazepam 0.5 Mg Tabs (Clonazepam) .... Take 1 tablet by mouth daily 12)  Coumadin 5 Mg Tabs (Warfarin sodium) ...Marland KitchenMarland KitchenMarland Kitchen  Use as directed by anticoagulation clinic. 13)  Zegerid 40-1100 Mg Caps (Omeprazole-sodium bicarbonate) .... As needed 14)  Hydromet 5-1.5 Mg/12m Syrp (Hydrocodone-homatropine) ..[name]. 1-2 tsp every 4-6 hrs as needed cough/congestion , may make you sleepy 15)  Methylprednisolone (pak) 4 Mg Tabs (Methylprednisolone) .... Take as directed... 16)  Augmentin 875-125 Mg Tabs (Amoxicillin-pot clavulanate) .... Take 1 tab by mouth two times a day til gone 17)  Mucinex Maximum Strength 1200 Mg Xr12h-tab (Guaifenesin) .... Take 1 tab by mouth two times a day  Other Orders: Prescription Created Electronically ([id number]  Patient Instructions: 1)  Today we updated your med list- see below.... 2)  Today we perscribed a Depo shot, Medrol Dosepak, and Augmentuin antibiotic... don't forget to take the Mucinex Max one tab twice daily w/ plenty of fluids... you may use the Hydromet cough syrup as needed. 3)  Today we also did a follow up CWenonahwork... please call the "phone tree" in a few days for your lab results..Marland KitchenMarland Kitchen4)  Let's plan a follow up visit in 3-4 weeks to make sure this has all cleared & that you are back to baseline... Prescriptions: AUGMENTIN 875-125 MG TABS (AMOXICILLIN-POT CLAVULANATE) take 1 tab by mouth two times a day til gone  #20 x 0   Entered and Authorized by:   SNoralee SpaceMD   Signed by:   SNoralee SpaceMD on 03/31/2009   Method used:   Electronically to        [facility]  [address] #[phone] (retail)       [address]      [place]  [id number]      Ph: [phone] [phone]      Fax: [phone]  RxID:   [id number] (PAK) 4 MG TABS (METHYLPREDNISOLONE) take as directed...  #1 pack x 0   Entered and Authorized by:    SNoralee SpaceMD   Signed by:   SNoralee SpaceMD on 03/31/2009   Method used:   Electronically to        [facility]  [address] [facility][phone] (retail)       [address]      [place]   [address]      Ph: [phone] [phone]      Fax: [phone]  RxID:   [id number]   Medication Administration  Injection # 1:    Medication: Depo- Medrol 8100m   Diagnosis: BRONCHITIS, ACUTE (ICD-466.0)    Route: IM    Site: RUOQ gluteus    Exp Date: 12/2011    Lot #: [id number]    Mfr: Pharmacia    Patient tolerated injection without complications    Given by: LeElita BooneMA (March 31, 2009 2:51 PM)  Orders Added: 1)  Prescription Created Electronically [G8553] 2)  Est. Patient Level IV [9[49449])  T-2 View CXR [71020TC] 4)  TLB-BMP (Basic Metabolic Panel-BMET) [8[67591-MBWGYKZ])  TLB-Hepatic/Liver Function Pnl [80076-HEPATIC] 6)  TLB-CBC Platelet - w/Differential [85025-CBCD] 7)  TLB-TSH (Thyroid Stimulating Hormone) [84443-TSH] 8)  TLB-PT (Protime) [85610-PTP] 9)  TLB-Sedimentation Rate (ESR) [85652-ESR] 10)  Depo- Medrol 8054mJ1040] 11)  Admin of Therapeutic Inj  intramuscular or subcutaneous [96[99357]

## 2010-03-02 NOTE — Assessment & Plan Note (Signed)
Summary: Acute NP office visit   Primary Provider/Referring Provider:  Teressa Lower, MD  CC:  prod cough with green mucus, f/c/s, body aches, HA, weakness x5days, vomiting x1 this morning.  denies SOB, and wheezing.  History of Present Illness: 77 year old with known history of HTN, Hyperlipidemia, and CAD.   2/09-- follow up visit... I last saw him 9/08 for a routine f/u and he was stable w/ mult med problems... he saw TParrett,NP on 02/21/07 w/ sinusitis Rx'd w/ OMNICEF, VERAMIST, MUCINEX and improved...  January 24, 2008--Complains of 2 weeks of productive cough with congestion, yellow/white mucus, malasie, aches,. Denies chest pain, dyspnea, orthopnea, hemoptysis, fever, n/v/d, edema.   March 26, 2009 --Presents for an acute office visit. Complains of prod cough with green mucus, f/c/s, body aches, HA, weakness x5days, vomiting x1 this morning. This am while he was brushing teeth he vomitted. He has felt feverish. Took tylenol that helped. Did receive flu shot in fall. Denies chest pain, dyspnea, orthopnea, hemoptysis, diarrhea, edema, headache or urinary symptoms.      Medications Prior to Update: 1)  Bayer Aspirin Ec Low Dose 81 Mg  Tbec (Aspirin) .... Take 1 Tablet By Mouth Once A Day 2)  Carvedilol 25 Mg Tabs (Carvedilol) .... Take One Tablet By Mouth Twice A Day 3)  Lisinopril 20 Mg Tabs (Lisinopril) .... Take One Tablet By Mouth Daily 4)  Lasix 40 Mg  Tabs (Furosemide) .... Take 1 Tablet By Mouth Once A Day 5)  Klor-Con 20 Meq  Pack (Potassium Chloride) .... Take 1 Tablet By Mouth Once A Day 6)  Simvastatin 20 Mg Tabs (Simvastatin) .... Take One Tablet By Mouth Daily At Bedtime 7)  Mercaptopurine 50 Mg  Tabs (Mercaptopurine) .... Take 1.5 Tabs By Mouth Once Daily As Directed By Drchowdhury... 8)  Vitamin B-12 1000 Mcg Tabs (Cyanocobalamin) .... Take One Tablet By Mouth Once Daily. 9)  Folic Acid 1 Mg  Tabs (Folic Acid) .... Take 1 Tablet By Mouth Once A Day 10)  Vitamin D 1000  Unit Tabs (Cholecalciferol) .... Take 1-2 Tablets By Mouth Once Daily 11)  Clonazepam 0.5 Mg Tabs (Clonazepam) .... Take 1 Tablet By Mouth Daily 12)  Coumadin 5 Mg Tabs (Warfarin Sodium) .... Use As Directed By Anticoagulation Clinic. 13)  Zegerid 40-1100 Mg Caps (Omeprazole-Sodium Bicarbonate) .... As Needed  Current Medications (verified): 1)  Bayer Aspirin Ec Low Dose 81 Mg  Tbec (Aspirin) .... Take 1 Tablet By Mouth Once A Day 2)  Carvedilol 25 Mg Tabs (Carvedilol) .... Take One Tablet By Mouth Twice A Day 3)  Lisinopril 20 Mg Tabs (Lisinopril) .... Take One Tablet By Mouth Daily 4)  Lasix 40 Mg  Tabs (Furosemide) .... Take 1 Tablet By Mouth Once A Day 5)  Klor-Con 20 Meq  Pack (Potassium Chloride) .... Take 1 Tablet By Mouth Once A Day 6)  Simvastatin 20 Mg Tabs (Simvastatin) .... Take One Tablet By Mouth Daily At Bedtime 7)  Mercaptopurine 50 Mg  Tabs (Mercaptopurine) .... Take 1.5 Tabs By Mouth Once Daily As Directed By Drchowdhury... 8)  Vitamin B-12 1000 Mcg Tabs (Cyanocobalamin) .... Take One Tablet By Mouth Once Daily. 9)  Folic Acid 1 Mg  Tabs (Folic Acid) .... Take 1 Tablet By Mouth Once A Day 10)  Vitamin D 1000 Unit Tabs (Cholecalciferol) .... Take 1-2 Tablets By Mouth Once Daily 11)  Clonazepam 0.5 Mg Tabs (Clonazepam) .... Take 1 Tablet By Mouth Daily 12)  Coumadin 5 Mg Tabs (Warfarin Sodium) .Marland KitchenMarland KitchenMarland Kitchen  Use As Directed By Anticoagulation Clinic. 13)  Zegerid 40-1100 Mg Caps (Omeprazole-Sodium Bicarbonate) .... As Needed  Allergies (verified): No Known Drug Allergies  Past History:  Past Medical History: Last updated: 12/02/2008 HYPERTENSION (ICD-401.9) CARDIOMYOPATHY, ISCHEMIC (ICD-414.8) CAD CABG 1994 CHF.... chronic systolic  EF 80%... echo... November, 2009 Bradycardia.... 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) LBBB (ICD-426.3) HYPERLIPIDEMIA (ICD-272.4) GERD (ICD-530.81) COLONIC POLYPS (ICD-211.3) CROHN'S DISEASE (ICD-555.9) Hx  of ACUTE VASCULAR INSUFFICIENCY OF INTESTINE (ICD-557.0) VENTRAL HERNIA (ICD-553.20) DEGENERATIVE JOINT DISEASE (ICD-715.90) Hx of POLYMYALGIA RHEUMATICA (ICD-725) TREMOR (ICD-781.0) ANXIETY (ICD-300.00) Hx of SHINGLES (ICD-053.9) VITAMIN B12 DEFICIENCY (ICD-266.2) VITAMIN D DEFICIENCY (ICD-268.9)  Past Surgical History: Last updated: 12/02/2008 S/P CABG in 1994 S/P laparoscopic cholecystectomy in 2002 S/P ELap w/ superior mesenteric artery embolectomy by [facility] 1/03 S/P repair of incarcerated ventral hernia & lysis of adhesions 11/03 by DrHIngram S/P implantation of biventric cardioverter-defibrillator by DrTaylor in 8/06-[employer] Contak H170 S/P left inguinal hernia repair w/ mesh 6/08 by Joaquin [place]  Family History: Last updated: 11/06/2008 Coronary Heart Disease Hypertension Hyperlipidemia Sister had tremor...  Social History: Last updated: 03/26/2009 Married-3 children former smoker, quit in 1975 x3yr 1ppd. Social alcohol Employ- bulldozer driver  Risk Factors: Smoking Status: never (09/13/2008)  Social History: Married-3 children former smoker, quit in 1975 x233yr1ppd. Social alcohol Employ- bulldozer driver  Review of Systems      See HPI  Vital Signs:  Patient profile:   [age]ear old male Height:      72 inches Weight:      191 pounds BMI:     26.00 O2 Sat:      98 % on Room air Temp:     98.1 degrees F oral Pulse rate:   68 / minute BP sitting:   118 / 64  (left arm) Cuff size:   regular  Vitals Entered By: JeParke PoissonNA (March 26, 2009 2:21 PM)  O2 Flow:  Room air CC: prod cough with green mucus, f/c/s, body aches, HA, weakness x5days, vomiting x1 this morning.  denies SOB, wheezing Is Patient Diabetic? No Comments Medications reviewed with patient Daytime contact number verified with patient. JeParke PoissonNA  March 26, 2009 2:23 PM    Physical Exam  Additional Exam:  WD, WN, 7549/o WM in NAD... GENERAL:  Alert & oriented;  pleasant & cooperative... HEENT:  /AT, EOM-wnl, PERRLA, EACs-clear, TMs-wnl, +right max sinus tender, NOSE- sl red/ inflamm, THROAT-clear & wnl. NECK:  Supple w/ fairROM; no JVD; normal carotid impulses w/o bruits; no thyromegaly or nodules palpated; no lymphadenopathy. CHEST:  Clear to P & A; without wheezes/ rales/ or rhonchi heard... HEART:  Regular Rhythm; gr 1/6 SEM without rubs or gallops detected... ABDOMEN:  Soft & nontender; mult scars, & ventral hernia; normal bowel sounds; no organomegaly or masses palpated. EXT: without deformities, mod arthritic changes; no varicose veins/ venous insuffic/ or edema. NEURO:  CN's intact; I do not apprec a tremor at present; no focal neuro deficits... DERM:  No lesions noted; no rash etc...     Impression & Recommendations:  Problem # 1:  BRONCHITIS, ACUTE (ICD-466.0) Avelox 40062mnce daily for 7 days  Mucinex DM two times a day as needed cough/congestion  Hydromet 1-2 tsp every 4-6 hr as needed cough , may make you sleepy Increase fluids. and advance diet as tolerated.  Please contact office for sooner follow up if symptoms do not improve or worsen  His updated medication list for this problem includes:    Avelox  400 Mg Tabs (Moxifloxacin hcl) .[name]... 1 by mouth once daily    Hydromet 5-1.5 Mg/49m Syrp (Hydrocodone-homatropine) ..[name].. 1-2 tsp every 4-6 hrs as needed cough/congestion , may make you sleepy  Orders: Est. Patient Level IV ((83291  Medications Added to Medication List This Visit: 1)  Avelox 400 Mg Tabs (Moxifloxacin hcl) ..[name]. 1 by mouth once daily 2)  Hydromet 5-1.5 Mg/567mSyrp (Hydrocodone-homatropine) ...[name] 1-2 tsp every 4-6 hrs as needed cough/congestion , may make you sleepy  Complete Medication List: 1)  Bayer Aspirin Ec Low Dose 81 Mg Tbec (Aspirin) .... Take 1 tablet by mouth once a day 2)  Carvedilol 25 Mg Tabs (Carvedilol) .... Take one tablet by mouth twice a day 3)  Lisinopril 20 Mg Tabs (Lisinopril) .... Take one  tablet by mouth daily 4)  Lasix 40 Mg Tabs (Furosemide) .... Take 1 tablet by mouth once a day 5)  Klor-con 20 Meq Pack (Potassium chloride) .... Take 1 tablet by mouth once a day 6)  Simvastatin 20 Mg Tabs (Simvastatin) .... Take one tablet by mouth daily at bedtime 7)  Mercaptopurine 50 Mg Tabs (Mercaptopurine) .... Take 1.5 tabs by mouth once daily as directed by drchowdhury... 8)  Vitamin B-12 1000 Mcg Tabs (Cyanocobalamin) .... Take one tablet by mouth once daily. 9)  Folic Acid 1 Mg Tabs (Folic acid) .... Take 1 tablet by mouth once a day 10)  Vitamin D 1000 Unit Tabs (Cholecalciferol) .... Take 1-2 tablets by mouth once daily 11)  Clonazepam 0.5 Mg Tabs (Clonazepam) .... Take 1 tablet by mouth daily 12)  Coumadin 5 Mg Tabs (Warfarin sodium) .... Use as directed by anticoagulation clinic. 13)  Zegerid 40-1100 Mg Caps (Omeprazole-sodium bicarbonate) .... As needed 14)  Avelox 400 Mg Tabs (Moxifloxacin hcl) ...[name] 1 by mouth once daily 15)  Hydromet 5-1.5 Mg/11m69myrp (Hydrocodone-homatropine) ....[name]1-2 tsp every 4-6 hrs as needed cough/congestion , may make you sleepy  Patient Instructions: 1)  Avelox 400m59mce daily for 7 days  2)  Mucinex DM two times a day as needed cough/congestion  3)  Hydromet 1-2 tsp every 4-6 hr as needed cough , may make you sleepy 4)  Increase fluids. and advance diet as tolerated.  5)  Please contact office for sooner follow up if symptoms do not improve or worsen  Prescriptions: HYDROMET 5-1.5 MG/5ML SYRP (HYDROCODONE-HOMATROPINE) 1-2 tsp every 4-6 hrs as needed cough/congestion , may make you sleepy  #8 oz x 0   Entered and Authorized by:   TammRexene Edison  Signed by:   Abbigail Anstey NP on 03/26/2009   Method used:   Print then Give to Patient   RxID:   [id number] 400 MG TABS (MOXIFLOXACIN HCL) 1 by mouth once daily  #7 x 0   Entered and Authorized by:   TammRexene Edison  Signed by:   Lexander Tremblay NP on 03/26/2009   Method used:    Electronically to        [facility]  [address] #[id number])       [address]   [place]  [id number]   Ph: [phone]   Fax: [phone]:   [id number]

## 2010-03-02 NOTE — Medication Information (Signed)
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 no    Recent/future dental: no  Any missed doses?: no       Is patient compliant with meds? yes       Allergies (verified): No Known Drug Allergies  Anticoagulation Management History:      The patient is taking warfarin and comes in today for a routine follow up visit.  Positive risk factors for bleeding include an age of 75 years or older.  The bleeding index is 'intermediate risk'.  Positive CHADS2 values include History of CHF, History of HTN, and Age > 75 years old.  The start date was 02/13/2000.  Anticoagulation responsible provider: Olevia Perches MD, Darnell Level.  INR POC: 2.5.  Cuvette Lot#: [id number].  Exp: 05/2010.    Anticoagulation Management Assessment/Plan:      The patient's current anticoagulation dose is Coumadin 5 mg tabs: Use as directed by anticoagulation clinic..  The target INR is 2.0-3.0.  The next INR is due 03/25/2009.  Anticoagulation instructions were given to patient.  Results were reviewed/authorized by Freddrick March, RN, BSN.  He was notified by Freddrick March RN.         Prior Anticoagulation Instructions: INR 2.2 Continue 58ms daily= 3 tablets everyday. Recheck in 4 weeks.   Current Anticoagulation Instructions: INR 2.5  Continue on same dosage 155m (3 tablets) daily.   Recheck in 4 weeks.

## 2010-03-02 NOTE — Progress Notes (Signed)
Summary: questions re machine to flash when pt out of town   Phone Note Call from Patient   Caller: Patient [phone] Reason for Call: Talk to Nurse Summary of Call: pt's wife left a message w/research and they haven't called her back-wife wanted to let the nurse know they won't be here when his machine  will flash on thursday and wanted to know what to do-pls call [phone] Initial call taken by: Lorenda Hatchet,  July 31, 2009 10:13 AM  Follow-up for Phone Call        I have  called Collie Siad in [employer]. She will relay the message to Adonis Huguenin and ask her to call the pt's wife. Alvis Lemmings, RN, BSN  July 31, 2009 10:46 AM   Sent back to [place], this pt is no longer in a research study Sandie Ano, RN, BSN  September 15, 2009 12:49 PM

## 2010-03-02 NOTE — Medication Information (Signed)
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 yes       Details: Completed zpack and Medrol.  Continue on Augmentin x1-2 days left.    Recent/future dental: no  Any missed doses?: no       Is patient compliant with meds? yes       Allergies (verified): No Known Drug Allergies  Anticoagulation Management History:      The patient is taking warfarin and comes in today for a routine follow up visit.  Positive risk factors for bleeding include an age of 49 years or older.  The bleeding index is 'intermediate risk'.  Positive CHADS2 values include History of CHF, History of HTN, and Age > 39 years old.  The start date was 02/13/2000.  His last INR was 3.3 ratio.  Anticoagulation responsible provider: Johnsie Cancel MD, Collier Salina.  INR POC: 2.1.  Exp: 06/2010.    Anticoagulation Management Assessment/Plan:      The patient's current anticoagulation dose is Coumadin 5 mg tabs: Use as directed by anticoagulation clinic..  The target INR is 2.0-3.0.  The next INR is due 04/22/2009.  Anticoagulation instructions were given to patient.  Results were reviewed/authorized by Freddrick March, RN, BSN.  He was notified by Freddrick March RN.         Prior Anticoagulation Instructions: INR 3.3 Skip today's dose and while on Medrol change dose to 54ms everyday except 143m on Mondays and Fridays. Recheck in one week.  Current Anticoagulation Instructions: INR 2.1  Resume previous dosage 3 tablets (1544mdaily.  Recheck in 2 weeks.

## 2010-03-02 NOTE — Medication Information (Signed)
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 yes       Details: stopped coumadin to implant defribillator. Started back friday night.  Recent/future dental: no  Any missed doses?: yes     Details: off coumadin for 3 days for defibrillator placement (last tues, wed, thurs)  Is patient compliant with meds? yes      Comments: needs to schedule an appointment for eye surgery. It is not currently scheduled. Will call us when they get a date.  Allergies: No Known Drug Allergies  Anticoagulation Management History:      The patient is taking warfarin and comes in today for a routine follow up visit.  Positive risk factors for bleeding include an age of 75 years or older.  The bleeding index is 'intermediate risk'.  Positive CHADS2 values include History of CHF, History of HTN, and Age > 75 years old.  The start date was 02/13/2000.  His last INR was 1.8 ratio.  Anticoagulation responsible Isaah Furry: Lia Foyer MD, Marcello Moores.  INR POC: 1.4.  Cuvette Lot#: [id number].  Exp: 01/01/2011.    Anticoagulation Management Assessment/Plan:      The patient's current anticoagulation dose is Coumadin 5 mg tabs: Use as directed by anticoagulation clinic..  The target INR is 2.0-3.0.  The next INR is due 12/10/2009.  Anticoagulation instructions were given to patient.  Results were reviewed/authorized by Mammie Lorenzo, PharmD.         Prior Anticoagulation Instructions: INR 2.7 Continue 3 pills everyday except 4 pills on Mondays. Recheck in 4 weeks.   Current Anticoagulation Instructions: INR 1.4 Today and tomorrow take 4 tablets. Then go back to  normal dosing schedule of 3 tablets everyday except 4 tablets on monday. Recheck in 1 week.

## 2010-03-02 NOTE — Procedures (Signed)
Summary: Cardiology Device Clinic    Allergies: No Known Drug Allergies   ICD Specifications Following MD:  Virl Axe, MD     ICD Vendor:  [employer]     ICD Model Number:  R518     ICD Serial Number:  [id number] ICD DOI:  09/07/2004     ICD Implanting MD:  Cristopher Peru, MD  Lead 1:    Location: RA     DOI: 09/07/2004     Model #: [id number]     Serial #: [id number]     Status: active Lead 2:    Location: RV     DOI: 09/07/2004     Model #: [id number]     Serial #: [id number]     Status: active Lead 3:    Location: LV     DOI: 10/11/2004     Model #: [id number]     Serial #: [id number]     Status: active  Indications::  ICM, CHF, MADIT-CRT   ICD Follow Up Remote Check?  No Battery Voltage:  2.53 V     Charge Time:  10.0 seconds     Battery Est. Longevity:  MOL2 Underlying rhythm:  Brady ICD Dependent:  No       ICD Device Measurements Atrium:  Amplitude: 6.6 mV, Impedance: 547 ohms, Threshold: 0.8 V at 0.5 msec Right Ventricle:  Amplitude: 25 mV, Impedance: 722 ohms, Threshold: 0.6 V at 0.5 msec Left Ventricle:  Amplitude: 12.5 mV, Impedance: 387 ohms, Threshold: 1.2 V at 0.8 msec Shock Impedance: 44 ohms   Episodes MS Episodes:  1     Percent Mode Switch:  0%     Coumadin:  Yes Shock:  0     ATP:  0     Nonsustained:  0     Atrial Pacing:  37%     Ventricular Pacing:  98%  Brady Parameters Mode DDD     Lower Rate Limit:  60     Upper Rate Limit 140 PAV 60      Tachy Zones VF:  210     VT:  180     Tech Comments:  Device checked with Dr. [name] visit.  No parameter changes.  Device function normal.  ROV 3 months clinic. Alma Friendly, LPN  July 17, 3149 76:16 AM

## 2010-03-02 NOTE — Medication Information (Signed)
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 Pneumonia  Bleeding/hemorrhagic complications: yes       Details: Occ. nose bleed in the mornings.   Recent/future hospitalizations: no    Any changes in medication regimen? yes       Details: Augmentin BID for 10 days and Medrol dose pack for 6 days. Also received steroid inj. in office yesterday.   Recent/future dental: no  Any missed doses?: no       Is patient compliant with meds? yes      Comments: Saw Dr. Lenna Gilford INR done in his office. 3.3   Allergies: No Known Drug Allergies  Anticoagulation Management History:      The patient is taking warfarin and comes in today for a routine follow up visit.  Positive risk factors for bleeding include an age of 75 years or older.  The bleeding index is 'intermediate risk'.  Positive CHADS2 values include History of CHF, History of HTN, and Age > 66 years old.  The start date was 02/13/2000.  His last INR was 3.3 ratio.  Prothrombin time is 33.5.  Anticoagulation responsible provider: Lia Foyer MD, Marcello Moores.  INR POC: 3.3.  Cuvette Lot#: [id number].  Exp: 06/2010.    Anticoagulation Management Assessment/Plan:      The patient's current anticoagulation dose is Coumadin 5 mg tabs: Use as directed by anticoagulation clinic..  The target INR is 2.0-3.0.  The next INR is due 04/08/2009.  Anticoagulation instructions were given to patient.  Results were reviewed/authorized by Tula Nakayama, RN, BSN.  He was notified by Tula Nakayama, RN, BSN.         Prior Anticoagulation Instructions: INR 2.5  Continue on same dosage 24ms (3  tablets) daily.   Recheck in 4 weeks.    Current Anticoagulation Instructions: INR 3.3 Skip today's dose and while on Medrol change dose to 110m everyday except 1067mon Mondays and Fridays. Recheck in one week.

## 2010-03-02 NOTE — Assessment & Plan Note (Signed)
Summary: 3 MONTH ROV/SL      Allergies Added: NKDA  Visit Type:  Follow-up Primary Provider:  Teressa Lower, MD  CC:  CAD.  History of Present Illness: Patient is seen for followup of coronary disease.  He does have an ischemic cardiomyopathy.  He Is not having chest pain.  He has no syncope or presyncope.  He has been feeling some dizziness when standing up.  He is not had any documented orthostatic blood pressure checks at his primary care office.  Current Medications (verified): 1)  Bayer Aspirin Ec Low Dose 81 Mg  Tbec (Aspirin) .... Take 1 Tablet By Mouth Once A Day 2)  Carvedilol 25 Mg Tabs (Carvedilol) .... Take One Tablet By Mouth Twice A Day 3)  Lisinopril 20 Mg Tabs (Lisinopril) .... Take One Tablet By Mouth Daily 4)  Lasix 40 Mg  Tabs (Furosemide) .... Take 1 Tablet By Mouth Once A Day 5)  Klor-Con 20 Meq  Pack (Potassium Chloride) .... Take 1 Tablet By Mouth Once A Day 6)  Simvastatin 20 Mg Tabs (Simvastatin) .... Take One Tablet By Mouth Daily At Bedtime 7)  Mercaptopurine 50 Mg  Tabs (Mercaptopurine) .... Take 1.5 Tabs By Mouth Once Daily As Directed By Drchowdhury... 8)  Vitamin B-12 1000 Mcg Tabs (Cyanocobalamin) .... Take One Tablet By Mouth Once Daily. 9)  Folic Acid 1 Mg  Tabs (Folic Acid) .... Take 1 Tablet By Mouth Once A Day 10)  Vitamin D 1000 Unit Tabs (Cholecalciferol) .... Take 1-2 Tablets By Mouth Once Daily 11)  Clonazepam 0.5 Mg Tabs (Clonazepam) .... Take 1 Tablet By Mouth Daily 12)  Coumadin 5 Mg Tabs (Warfarin Sodium) .... Use As Directed By Anticoagulation Clinic. 13)  Zegerid 40-1100 Mg Caps (Omeprazole-Sodium Bicarbonate) .... As Needed 14)  Hydromet 5-1.5 Mg/61m Syrp (Hydrocodone-Homatropine) ..[name]. 1-2 Tsp Every 4-6 Hrs As Needed Cough/congestion , May Make You Sleepy 15)  Mucinex Maximum Strength 1200 Mg Xr12h-Tab (Guaifenesin) .... Take 1 Tab By Mouth Two Times A Day  Allergies (verified): No Known Drug Allergies  Past History:  Past Medical  History: Last updated: 04/27/2009  HYPERTENSION (ICD-401.9) CARDIOMYOPATHY, ISCHEMIC (ICD-414.8) CAD CABG 1994 CHF.... chronic systolic  EF 362%.. echo... November, 2009 Bradycardia.... 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) LBBB (ICD-426.3) HYPERLIPIDEMIA (ICD-272.4) GERD (ICD-530.81) COLONIC POLYPS (ICD-211.3) CROHN'S DISEASE (ICD-555.9) Hx of ACUTE VASCULAR INSUFFICIENCY OF INTESTINE (ICD-557.0) VENTRAL HERNIA (ICD-553.20) HYPERTROPHY PROSTATE W/UR OBST & OTH LUTS (ICD-600.01) DEGENERATIVE JOINT DISEASE (ICD-715.90) Hx of POLYMYALGIA RHEUMATICA (ICD-725) TREMOR (ICD-781.0) ANXIETY (ICD-300.00) Hx of SHINGLES (ICD-053.9) VITAMIN B12 DEFICIENCY (ICD-266.2) VITAMIN D DEFICIENCY (ICD-268.9)  Review of Systems       Patient denies fever, chills, headache, sweats, rash, change in vision, change in hearing, chest pain, cough, nausea or vomiting, urinary symptoms.  All of the systems are reviewed and are negative  Vital Signs:  Patient profile:   77year old male Height:      72 inches Weight:      186 pounds BMI:     25.32 Pulse rate:   75 / minute BP sitting:   110 / 62  (left arm) Cuff size:   regular  Vitals Entered By: SMignon Pine RMA (Jun 08, 2009 11:11 AM)  Physical Exam  General:  he is stable today. Eyes:  no xanthelasma. Neck:  no jugular venous distention. Lungs:  lungs are clear.  Respiratory effort is nonlabored. Heart:  cardiac exam reveals S1-S2.  No clicks or significant murmurs. Abdomen:  abdomen is  soft. Extremities:  no peripheral edema. Psych:  patient is oriented to person time and place.  Affect is normal.    ICD Specifications Following MD:  Virl Axe, MD     ICD Vendor:  [place]     ICD Model Number:  L859     ICD Serial Number:  [id number] ICD DOI:  09/07/2004     ICD Implanting MD:  Cristopher Peru, MD  Lead 1:    Location: RA     DOI: 09/07/2004     Model #: [id number]     Serial #: [id number]      Status: active Lead 2:    Location: RV     DOI: 09/07/2004     Model #: 2257     Serial #: [id number]     Status: active Lead 3:    Location: LV     DOI: 10/11/2004     Model #: [id number]     Serial #: [id number]     Status: active  Indications::  ICM, CHF, MADIT-CRT   ICD Follow Up ICD Dependent:  No      Episodes Coumadin:  Yes  Brady Parameters Mode DDD     Lower Rate Limit:  60     Upper Rate Limit 140 PAV 60      Tachy Zones VF:  210     VT:  180     Impression & Recommendations:  Problem # 1:  CAD (ICD-414.00)  His updated medication list for this problem includes:    Bayer Aspirin Ec Low Dose 81 Mg Tbec (Aspirin) .[name]... Take 1 tablet by mouth once a day    Carvedilol 25 Mg Tabs (Carvedilol) .[name]... Take one tablet by mouth twice a day    Lisinopril 20 Mg Tabs (Lisinopril) .[name]... Take one tablet by mouth daily    Coumadin 5 Mg Tabs (Warfarin sodium) ..... Use as directed by anticoagulation clinic. coronary disease is stable.  No change in therapy.  Problem # 2:  CARDIOMYOPATHY, ISCHEMIC (ICD-414.8)  His updated medication list for this problem includes:    Bayer Aspirin Ec Low Dose 81 Mg Tbec (Aspirin) .[name]... Take 1 tablet by mouth once a day    Carvedilol 25 Mg Tabs (Carvedilol) .[name]... Take one tablet by mouth twice a day    Lisinopril 20 Mg Tabs (Lisinopril) .[name]... Take one tablet by mouth daily    Lasix 40 Mg Tabs (Furosemide) .[name]... Take 1 tablet by mouth once a day    Coumadin 5 Mg Tabs (Warfarin sodium) ..... Use as directed by anticoagulation clinic. The patient is on appropriate meds.  His ICD is in place.  Problem # 3:  * POSITIONAL DIZZINESS At this point I think there still is a chance that some of his symptoms are related to his low blood pressure.  It is reported that his pressure is lower at home that is here today.  We will cut his lisinopril dose in half and I will see him for followup.  We will consider whether programming of his ICD for a better rate response would  help.  Patient Instructions: 1)  Cut Lisinopril in half 2)  Follow up in 4 weeks

## 2010-03-02 NOTE — Assessment & Plan Note (Signed)
Summary: icd reached eri/mt      Allergies Added: NKDA  Primary Electa Sterry:  Teressa Lower, MD   History of Present Illness:  [name] is seen in followup for an ICD implanted for primary prevention in this setting of ischemic coronary disease As part of the MADIT-CRT trial.   He has had no therapy  The patient denies SOB, chest pain, edema or palpitations  There has however  been a modest worseing of energy in the last few weeks   His last catheterization in 2006 demonstrated a total right and 80% marginal. Vein graft to the diagonal was occluded and other bypasses were open ejection fraction 2007 was about 35%  He takes coumadin 2/2 acute abdominal vascular event-? arterial  Current Medications (verified): 1)  Bayer Aspirin Ec Low Dose 81 Mg  Tbec (Aspirin) .... Take 1 Tablet By Mouth Once A Day 2)  Carvedilol 25 Mg Tabs (Carvedilol) .... Take One Tablet By Mouth Twice A Day 3)  Lisinopril 10 Mg Tabs (Lisinopril) .... Take One Tablet By Mouth Daily 4)  Lasix 40 Mg  Tabs (Furosemide) .... Take 1 Tablet By Mouth Once A Day 5)  Spironolactone 25 Mg Tabs (Spironolactone) .... Take 1 Tablet By Mouth Daily 6)  Simvastatin 20 Mg Tabs (Simvastatin) .... Take One Tablet By Mouth Daily At Bedtime 7)  Mercaptopurine 50 Mg  Tabs (Mercaptopurine) .... Take 1.5 Tabs By Mouth Once Daily As Directed By Drchowdhury... 8)  Vitamin B-12 1000 Mcg Tabs (Cyanocobalamin) .... Take One Tablet By Mouth Once Daily. 9)  Folic Acid 1 Mg  Tabs (Folic Acid) .... Take 1 Tablet By Mouth Once A Day 10)  Vitamin D 1000 Unit Tabs (Cholecalciferol) .... Take 1-2 Tablets By Mouth Once Daily 11)  Coumadin 5 Mg Tabs (Warfarin Sodium) .... Use As Directed By Anticoagulation Clinic.  Allergies (verified): No Known Drug Allergies  Past History:  Past Medical History: Last updated: 09/22/2009 Positional dizziness.... may, 2011 HYPERTENSION (ICD-401.9) CARDIOMYOPATHY, ISCHEMIC (ICD-414.8) CAD CABG 1994 CHF....  chronic systolic  EF 02%... echo... November, 2009 Bradycardia.... 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) LBBB (ICD-426.3) HYPERLIPIDEMIA (ICD-272.4) GERD (ICD-530.81) COLONIC POLYPS (ICD-211.3) CROHN'S DISEASE (ICD-555.9) Hx of ACUTE VASCULAR INSUFFICIENCY OF INTESTINE (ICD-557.0) VENTRAL HERNIA (ICD-553.20) HYPERTROPHY PROSTATE W/UR OBST & OTH LUTS (ICD-600.01) DEGENERATIVE JOINT DISEASE (ICD-715.90) Hx of POLYMYALGIA RHEUMATICA (ICD-725) TREMOR (ICD-781.0) ANXIETY (ICD-300.00) Hx of SHINGLES (ICD-053.9) VITAMIN B12 DEFICIENCY (ICD-266.2) VITAMIN D DEFICIENCY (ICD-268.9)  Past Surgical History: Last updated: 07/21/2009 S/P CABG in 1994 S/P laparoscopic cholecystectomy in 2002 S/P ELap w/ superior mesenteric artery embolectomy by [facility] 1/03 S/P repair of incarcerated ventral hernia & lysis of adhesions 11/03 by DrHIngram S/P implantation of biventric cardioverter-defibrillator by DrTaylor in 8/06-[employer]ant Contak H170 S/P left inguinal hernia repair w/ mesh 6/08 by Joaquin [place]  Family History: Last updated: 11/06/2008 Coronary Heart Disease Hypertension Hyperlipidemia Sister had tremor...  Social History: Last updated: 03/26/2009 Married-3 children former smoker, quit in 1975 x44yr 1ppd. Social alcohol Employ- bulldozer driver  Vital Signs:  Patient profile:   77year old male Height:      72 inches Weight:      198 pounds BMI:     26.95 Pulse rate:   60 / minute Resp:     16 per minute BP sitting:   106 / 68  (right arm)  Vitals Entered By: CLevora Angel CNA (November 10, 2009 2:01 PM)  Physical Exam  General:  The patient was alert and oriented in no acute distress. HEENT  Normal.  Neck veins were flat, carotids were brisk.  Lungs were clear.  Heart sounds were regular without murmurs or gallops.  Abdomen was soft with active bowel sounds. There is no clubbing cyanosis or edema. Skin Warm and dry    EKG  Procedure  date:  11/10/2009  Findings:      ventricular pacing is noted. There is a very short antecedent interval noted only in lead V1 with urgency atrial spike at 60 ms prior to the   ICD Specifications Following MD:  Virl Axe, MD     ICD Vendor:  [place] Number:  [id number]     ICD Serial Number:  [id number] ICD DOI:  09/07/2004     ICD Implanting MD:  Cristopher Peru, MD  Lead 1:    Location: RA     DOI: 09/07/2004     Model #: [id number]     Serial #: [id number]     Status: active Lead 2:    Location: RV     DOI: 09/07/2004     Model #: 3710     Serial #: [id number]     Status: active Lead 3:    Location: LV     DOI: 10/11/2004     Model #: [id number]     Serial #: [id number]     Status: active  Indications::  ICM, CHF, MADIT-CRT   ICD Follow Up ICD Dependent:  No      Episodes Coumadin:  Yes  Brady Parameters Mode DDD     Lower Rate Limit:  60     Upper Rate Limit 140 PAV 60      Tachy Zones VF:  210     VT:  180     Impression & Recommendations:  Problem # 1:  CORONARY ATHEROSCLEROSIS NATIVE CORONARY ARTERY (ICD-414.01) It has been a long time since last efaluation  and with impending device generator replacement, we will undertake myoview scanning.  Further I am bothered by the recent worsenig of symptoms His updated medication list for this problem includes:    Bayer Aspirin Ec Low Dose 81 Mg Tbec (Aspirin) .[name]... Take 1 tablet by mouth once a day    Carvedilol 25 Mg Tabs (Carvedilol) .[name]... Take one tablet by mouth twice a day    Lisinopril 10 Mg Tabs (Lisinopril) .[name]... Take one tablet by mouth daily    Coumadin 5 Mg Tabs (Warfarin sodium) ..... Use as directed by anticoagulation clinic.  Problem # 2:  CHRONIC SYSTOLIC HEART FAILURE (GHW-299.37) Well medicated and relatively stable.  HIs AV delay on his device was programmed at 49 with an intrinisc of 240  I have reprogrmamed to 170 msec with dynamic AV delay on;  hopefully this will improve  Problem # 3:  SINUS BRADYCARDIA  (ICD-427.81) Rate response was also activated as he appears to be limited to <100 bpm His updated medication list for this problem includes:    Bayer Aspirin Ec Low Dose 81 Mg Tbec (Aspirin) .[name]... Take 1 tablet by mouth once a day    Carvedilol 25 Mg Tabs (Carvedilol) .[name]... Take one tablet by mouth twice a day    Lisinopril 10 Mg Tabs (Lisinopril) .[name]... Take one tablet by mouth daily    Coumadin 5 Mg Tabs (Warfarin sodium) ..... Use as directed by anticoagulation clinic.  Problem # 4:  IMPLANTABLE DEFIBRILLATOR, GDT CONTAK H170-MADIT-CRT (ICD-V45.02) Device parameters and data were reviewed and  changes were made as noted  above;  he needs ot undergo device generator replacemnet.  We discussed risk incl but not limite dto infection  Appended Document: icd reached eri/mt   Patient Instructions: 1)  Your physician recommends that you return for lab work in: November 20, 2009, 11:00 am 2)  Your physician recommends that you continue on your current medications as directed. Please refer to the Current Medication list given to you today. 3)  Your physician has requested that you have a Lexiscan echocardiogram.  For further information please visit [email or web address].  Please follow instruction sheet as given.

## 2010-03-02 NOTE — Medication Information (Signed)
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 no    Recent/future dental: no  Any missed doses?: no       Is patient compliant with meds? yes       Allergies: No Known Drug Allergies  Anticoagulation Management History:      The patient is taking warfarin and comes in today for a routine follow up visit.  Positive risk factors for bleeding include an age of 75 years or older.  The bleeding index is 'intermediate risk'.  Positive CHADS2 values include History of CHF, History of HTN, and Age > 75 years old.  The start date was 02/13/2000.  His last INR was 3.3 ratio.  Anticoagulation responsible provider: Caryl Comes MD, Remo Lipps.  INR POC: 2.5.  Cuvette Lot#: [id number].  Exp: 09/2010.    Anticoagulation Management Assessment/Plan:      The patient's current anticoagulation dose is Coumadin 5 mg tabs: Use as directed by anticoagulation clinic..  The target INR is 2.0-3.0.  The next INR is due 07/14/2009.  Anticoagulation instructions were given to patient.  Results were reviewed/authorized by Tula Nakayama, RN, BSN.  He was notified by Tula Nakayama, RN, BSN.         Prior Anticoagulation Instructions: INR 2.8  Continue on same dosage 3 tablets daily.  Recheck in 4 weeks.    Current Anticoagulation Instructions: INR 2.5 Continue 3 pills everyday. Recheck in 4 weeks.

## 2010-03-02 NOTE — Letter (Signed)
Summary: Implantable Device Instructions  [facility], [place]  [address] [place]   [place], [place] [address]   Phone: [phone]  Fax: [phone]      Implantable Device Instructions  You are scheduled for:  _____ Permanent Transvenous Pacemaker _____ Implantable Cardioverter Defibrillator _____ Implantable Loop Recorder ___x__ Generator Change  on November 27, 2009 at 0745 with Dr. Caryl Comes.  1.  Please arrive at the [place] at [facility] at [phone] on the day of your procedure.  2.  Do not eat or drink the night before your procedure.  3.  Complete lab work on November 20, 2009 at 11:00.  The lab at [facility] is open from 8:30 AM to 1:30 PM and from 2:30 PM to 5:00 PM.  The lab at [facility] is open from 7:30 AM to 5:30 PM.  You do not have to be fasting.  4.  Do NOT take Lasix the morning of the procedure.   5.  Plan for an overnight stay.  Bring your insurance cards and a list of your medications.  6.  Wash your chest and neck with antibacterial soap (any brand) the evening before and the morning of your procedure.  Rinse well.   *If you have ANY questions after you get home, please call the office [phone]. Suanne Marker, RN  *Every attempt is made to prevent procedures from being rescheduled.  Due to the nauture of Electrophysiology, rescheduling can happen.  The physician is always aware and directs the staff when this occurs.

## 2010-03-02 NOTE — Medication Information (Signed)
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 no    Recent/future dental: no  Any missed doses?: no       Is patient compliant with meds? yes       Allergies: No Known Drug Allergies  Anticoagulation Management History:      The patient is taking warfarin and comes in today for a routine follow up visit.  Positive risk factors for bleeding include an age of 75 years or older.  The bleeding index is 'intermediate risk'.  Positive CHADS2 values include History of CHF, History of HTN, and Age > 42 years old.  The start date was 02/13/2000.  His last INR was 3.3 ratio.  Anticoagulation responsible provider: Angelena Form MD, Harrell Gave.  INR POC: 2.1.  Cuvette Lot#: [id number].  Exp: 12/2010.    Anticoagulation Management Assessment/Plan:      The patient's current anticoagulation dose is Coumadin 5 mg tabs: Use as directed by anticoagulation clinic..  The target INR is 2.0-3.0.  The next INR is due 11/04/2009.  Anticoagulation instructions were given to patient.  Results were reviewed/authorized by Margaretha Sheffield, PharmD.  He was notified by Sharol Harness, PharmD candidate.         Prior Anticoagulation Instructions: INR 2.0  Coumadin 5m tab take 3 tablets each day except 4 tab on Mon  Current Anticoagulation Instructions: INR 2.1  Continue taking 3 tablets every day except take 4 tablets on Mondays. Re-check INR in 4 weeks.

## 2010-03-02 NOTE — Miscellaneous (Signed)
Summary: Device change out  Clinical Lists Changes  Observations: Added new observation of ICD IMP MD: Virl Axe, MD (12/02/2009 8:00) Added new observation of ICD IMPL DTE: 11/27/2009 (12/02/2009 8:00) Added new observation of ICD SERL#: [id number]  (12/02/2009 8:00) Added new observation of ICD MODL#: N119  (12/02/2009 8:00) Added new observation of ICDEXPLCOMM: 11/27/2009 [place] H170/361980 explanted  (12/02/2009 8:00)       ICD Specifications Following MD:  Virl Axe, MD     ICD Vendor:  [place] Number:  [id number]     ICD Serial Number:  [id number] ICD DOI:  11/27/2009     ICD Implanting MD:  Virl Axe, MD  Lead 1:    Location: RA     DOI: 09/07/2004     Model #: 8502     Serial #: [id number]     Status: active Lead 2:    Location: RV     DOI: 09/07/2004     Model #: 7867     Serial #: [id number]     Status: active Lead 3:    Location: LV     DOI: 10/11/2004     Model #: 7096G     Serial #: [id number]     Status: active  Indications::  ICM, CHF, MADIT-CRT  Explantation Comments: 11/27/2009 [employer] Contak H170/361980 explanted  ICD Follow Up ICD Dependent:  No      Episodes Coumadin:  Yes  Brady Parameters Mode DDD     Lower Rate Limit:  60     Upper Rate Limit 140 PAV 60      Tachy Zones VF:  210     VT:  180

## 2010-03-02 NOTE — Miscellaneous (Signed)
  Clinical Lists Changes  Observations: Added new observation of PAST MED HX: Positional dizziness.... may, 2011 HYPERTENSION (ICD-401.9) CARDIOMYOPATHY, ISCHEMIC (ICD-414.8) CAD CABG 1994 CHF.... chronic systolic  EF 30%... echo... November, 2009 Bradycardia.... 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) LBBB (ICD-426.3) HYPERLIPIDEMIA (ICD-272.4) GERD (ICD-530.81) COLONIC POLYPS (ICD-211.3) CROHN'S DISEASE (ICD-555.9) Hx of ACUTE VASCULAR INSUFFICIENCY OF INTESTINE (ICD-557.0) VENTRAL HERNIA (ICD-553.20) HYPERTROPHY PROSTATE W/UR OBST & OTH LUTS (ICD-600.01) DEGENERATIVE JOINT DISEASE (ICD-715.90) Hx of POLYMYALGIA RHEUMATICA (ICD-725) TREMOR (ICD-781.0) ANXIETY (ICD-300.00) Hx of SHINGLES (ICD-053.9) VITAMIN B12 DEFICIENCY (ICD-266.2) VITAMIN D DEFICIENCY (ICD-268.9)  (07/16/2009 13:12) Added new observation of PRIMARY MD: Teressa Lower, MD (07/16/2009 13:12)       Past History:  Past Medical History: Positional dizziness.... may, 2011 HYPERTENSION (ICD-401.9) CARDIOMYOPATHY, ISCHEMIC (ICD-414.8) CAD CABG 1994 CHF.... chronic systolic  EF 14%... echo... November, 2009 Bradycardia.... 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) LBBB (ICD-426.3) HYPERLIPIDEMIA (ICD-272.4) GERD (ICD-530.81) COLONIC POLYPS (ICD-211.3) CROHN'S DISEASE (ICD-555.9) Hx of ACUTE VASCULAR INSUFFICIENCY OF INTESTINE (ICD-557.0) VENTRAL HERNIA (ICD-553.20) HYPERTROPHY PROSTATE W/UR OBST & OTH LUTS (ICD-600.01) DEGENERATIVE JOINT DISEASE (ICD-715.90) Hx of POLYMYALGIA RHEUMATICA (ICD-725) TREMOR (ICD-781.0) ANXIETY (ICD-300.00) Hx of SHINGLES (ICD-053.9) VITAMIN B12 DEFICIENCY (ICD-266.2) VITAMIN D DEFICIENCY (ICD-268.9)

## 2010-03-02 NOTE — Progress Notes (Signed)
Summary: needs apt with nadel  Phone Note Call from Patient Call back at Home Phone [phone]   Caller: Patient Call For: nadel Summary of Call: patient was just seen by dr nadel. he needs a 3-4 wk reck. Lenna Gilford is booked. please call his wife Rajeev Escue with the apt. [phone]  thanks Initial call taken by: Adin Hector,  March 31, 2009 1:20 PM  Follow-up for Phone Call        called and spoke with jackie---pts wife---she is aware of appt made for 3-28 at [place]  March 31, 2009 2:19 PM

## 2010-03-02 NOTE — Medication Information (Signed)
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 no    Recent/future dental: no  Any missed doses?: no       Is patient compliant with meds? yes       Allergies: No Known Drug Allergies  Anticoagulation Management History:      The patient is taking warfarin and comes in today for a routine follow up visit.  Positive risk factors for bleeding include an age of 75 years or older.  The bleeding index is 'intermediate risk'.  Positive CHADS2 values include History of CHF, History of HTN, and Age > 77 years old.  The start date was 02/13/2000.  His last INR was 3.3 ratio.  Anticoagulation responsible provider: Mirah Nevins MD, Quillian Quince.  INR POC: 2.1.  Exp: 10/2010.    Anticoagulation Management Assessment/Plan:      The patient's current anticoagulation dose is Coumadin 5 mg tabs: Use as directed by anticoagulation clinic..  The target INR is 2.0-3.0.  The next INR is due 09/17/2009.  Anticoagulation instructions were given to patient.  Results were reviewed/authorized by Freddrick March, RN, BSN.  He was notified by Freddrick March RN.         Prior Anticoagulation Instructions: INR 1.7  Take 4 tablets today, then start taking 3 tablets daily except 4 tablets on Mondays.  Recheck in 2-3 weeks.   Current Anticoagulation Instructions: INR 2.1  Continue on same dosage 3 tablets daily except 4 tablets on Mondays.  Recheck in 3 weeks.

## 2010-03-02 NOTE — Progress Notes (Signed)
Summary: refill question   Phone Note From Pharmacy Call back at [phone]   Caller: [facility] Summary of Call: coumadin question ref # [id number] Initial call taken by: Darnell Level,  February 23, 2009 10:35 AM  Follow-up for Phone Call        Called spoke with [facility] verified coumadin rx #270 tablets equals a 90 day supply and instructs should read Take as directed by coumadin clinic.  Follow-up by: Freddrick March RN,  February 23, 2009 10:44 AM

## 2010-03-02 NOTE — Cardiovascular Report (Signed)
Summary: Office Visit   Office Visit   Imported By: Sallee Provencal 03/12/2009 15:01:19  _____________________________________________________________________  External Attachment:    Type:   Image     Comment:   External Document

## 2010-03-02 NOTE — Progress Notes (Signed)
Summary: Nuclear Pre-Procedure  Phone Note Outgoing Call   Call placed by: Loma Newton Summary of Call: Reviewed information on Myoview Information Sheet (see scanned document for further details).  Spoke with patient and his wife.     Nuclear Med Background Indications for Stress Test: Evaluation for Ischemia, Surgical Clearance, Graft Patency  Indications Comments: ICD Implantation 11/27/2009  History: CABG, Defibrillator, Echo, Heart Catheterization, Myocardial Infarction, Myocardial Perfusion Study  History Comments: 1994 MI                                                 1994 CABG x 4 '03 MPS EF 37% no significant ischemia 2006 Defibrilator 2006 Heart Cath total RCA 2009 ECHO EF 35%   Symptoms: Dizziness, DOE    Nuclear Pre-Procedure Cardiac Risk Factors: Family History - CAD, History of Smoking, Hypertension, LBBB, Lipids Height (in): 2  Nuclear Med Study Referring MD:  Dola Argyle MD

## 2010-03-02 NOTE — Medication Information (Signed)
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 placement. Warfarin was held x3d  Any changes in medication regimen? no    Recent/future dental: no  Any missed doses?: no       Is patient compliant with meds? yes       Allergies: No Known Drug Allergies  Anticoagulation Management History:      The patient is taking warfarin and comes in today for a routine follow up visit.  Positive risk factors for bleeding include an age of 72 years or older.  The bleeding index is 'intermediate risk'.  Positive CHADS2 values include History of CHF, History of HTN, and Age > 62 years old.  The start date was 02/13/2000.  His last INR was 1.8 ratio.  Anticoagulation responsible provider: Lia Foyer MD, Marcello Moores.  Cuvette Lot#: [id number].  Exp: 01/2011.    Anticoagulation Management Assessment/Plan:      The patient's current anticoagulation dose is Coumadin 5 mg tabs: Use as directed by anticoagulation clinic..  The target INR is 2.0-3.0.  The next INR is due 01/11/2010.  Anticoagulation instructions were given to patient.  Results were reviewed/authorized by Porfirio Oar, PharmD.  He was notified by Carmin Richmond, PharmD Candidate.         Prior Anticoagulation Instructions: INR 1.7  Start NEW dosing schedule of 4 tablets on Friday and Monday, and 3 tablets all other days.  Return to clinic in 10 days.    Current Anticoagulation Instructions: INR 2.1 Continue previous dose of 3 tablets everyday except 4 tablets on Monday and Friday Recheck INR in 2 weeks

## 2010-03-02 NOTE — Letter (Signed)
Summary: Appointment - [place], [place]  [address]   [place], [place] [address]   Phone: [phone]  Fax: [phone]     Jun 11, 2009 MRN: [id number]   BARNET BENAVIDES [address] [facility], [place]  [address]   Dear Mr. Cappuccio,   Due to a change in our office schedule, your appointment on 07/13/2009 at 10:45 must be changed.  It is very important that we reach you to reschedule this appointment. We look forward to participating in your health care needs. Please contact us at the number listed above at your earliest convenience to reschedule this appointment.     Sincerely,  Darnell Level [facility] Scheduling Team

## 2010-03-02 NOTE — Medication Information (Signed)
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 no    Recent/future dental: no  Any missed doses?: no       Is patient compliant with meds? yes       Allergies: No Known Drug Allergies  Anticoagulation Management History:      Positive risk factors for bleeding include an age of 13 years or older.  The bleeding index is 'intermediate risk'.  Positive CHADS2 values include History of CHF, History of HTN, and Age > 62 years old.  The start date was 02/13/2000.  His last INR was 1.8 ratio.  Anticoagulation responsible Zamiah Tollett: Percival Spanish MD, Jeneen Rinks.  INR POC: 1.7.  Exp: 12/2010.    Anticoagulation Management Assessment/Plan:      The patient's current anticoagulation dose is Coumadin 5 mg tabs: Use as directed by anticoagulation clinic..  The target INR is 2.0-3.0.  The next INR is due 12/28/2009.  Anticoagulation instructions were given to patient.  Results were reviewed/authorized by Margaretha Sheffield, PharmD.  He was notified by Margaretha Sheffield.         Prior Anticoagulation Instructions: INR 1.7 Today take 4 tablets then resume 3 tablets everyday except 4 tablets on Mondays. Recheck in week.   Current Anticoagulation Instructions: INR 1.7  Start NEW dosing schedule of 4 tablets on Friday and Monday, and 3 tablets all other days.  Return to clinic in 10 days.

## 2010-03-02 NOTE — Progress Notes (Signed)
Summary: headache/ sweats  Phone Note Call from Patient Call back at Home Phone [phone]   Caller: Spouse Call For: nadel Summary of Call: spouse Kennyth Lose states that pt c/o headached x 4 days. had some fever off and on/ sweats/ body aches. has taken tylenol. I offered an appt w/ tp in [place] but this was declined. spouse says that pt has a coumadin clinic appt today but isn't going to go because he doesn't feel well. I suggested er or urgent care if needed. unclear as to what pt wants- says he's "grouchy". wants to speak to nurse.  Initial call taken by: Cooper Render, CNA,  March 26, 2009 9:10 AM  Follow-up for Phone Call        The patients wife is very concerned that the pt may have the flu or worse. She says the pt has had headaches, cough with green mucus, excessive sweating, body aches and fever for 4 days now. She is unable to check his temp because their thermometer is broken. She declined an appt with TP in [place] office, the pt doesn;t think he can go due to weakness. He is not drinking many fluids. I told her we would get recs from [facility] but she may need to get him to the ED or call 911 if his sxs get worse. Please advise. Follow-up by: Francesca Jewett CMA,  March 26, 2009 9:33 AM  Additional Follow-up for Phone Call Additional follow up Details #1::        per SN---could be the flu---or could be other viral infection---if he is too weak to come into the office for ov then he needs to go to the ER for an eval of his symptoms..rest fluids and tylenol as needed but SN rec that he go to the ER and by EMS if he is unable to get up and move. Elita Boone CMA  March 26, 2009 11:19 AM   LMCTB.Stuckey Bing CMA  March 26, 2009 11:20 AM     Additional Follow-up for Phone Call Additional follow up Details #2::    Patient has agreed to see TP in the [facility] office today at 2 pm. He is aware SN said he needs to be evaluated for his sxs because this could be the flu or another type  of viral infection. He did not want to go to the ED.Francesca Jewett [facility]  March 26, 2009 11:48 AM

## 2010-03-02 NOTE — Letter (Signed)
Summary: [facility]   Imported By: Bubba Hales 01/04/2010 08:23:14  _____________________________________________________________________  External Attachment:    Type:   Image     Comment:   External Document

## 2010-03-02 NOTE — Cardiovascular Report (Signed)
Summary: Pre Op Orders   Pre Op Orders   Imported By: Sallee Provencal 11/23/2009 16:09:30  _____________________________________________________________________  External Attachment:    Type:   Image     Comment:   External Document

## 2010-03-02 NOTE — Medication Information (Signed)
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  Seeing Dr. [name] today.   Bleeding/hemorrhagic complications: no    Recent/future hospitalizations: no    Any changes in medication regimen? no    Recent/future dental: no  Any missed doses?: no       Is patient compliant with meds? yes       Allergies: No Known Drug Allergies  Anticoagulation Management History:      The patient is taking warfarin and comes in today for a routine follow up visit.  Positive risk factors for bleeding include an age of 12 years or older.  The bleeding index is 'intermediate risk'.  Positive CHADS2 values include History of CHF, History of HTN, and Age > 77 years old.  The start date was 02/13/2000.  His last INR was 3.3 ratio.  Anticoagulation responsible provider: Olevia Perches MD, Darnell Level.  INR POC: 2.1.  Cuvette Lot#: [id number].  Exp: 09/2010.    Anticoagulation Management Assessment/Plan:      The patient's current anticoagulation dose is Coumadin 5 mg tabs: Use as directed by anticoagulation clinic..  The target INR is 2.0-3.0.  The next INR is due 08/14/2009.  Anticoagulation instructions were given to patient.  Results were reviewed/authorized by Porfirio Oar, PharmD.  He was notified by Porfirio Oar PharmD.         Prior Anticoagulation Instructions: INR 2.5 Continue 3 pills everyday. Recheck in 4 weeks.   Current Anticoagulation Instructions: INR 2.1  Continue same dose of 3 tablets every day.  Recheck in 4 weeks.

## 2010-03-02 NOTE — Assessment & Plan Note (Signed)
Summary: 4wk f/u 2 11:45/per klein and vanessa/sl  Medications Added CARVEDILOL 25 MG TABS (CARVEDILOL) Take one quarter tablet by mouth twice a day      Allergies Added: NKDA  Visit Type:  4 wk f/u Primary Kaori Jumper:  Teressa Lower, MD  CC:  pt states "he does have as much get up and go"....denies any other complaints today.  History of Present Illness: [name] is seen in followup for congestive heart failure with previously implanted CRT. He underwent AV optimization echo a month ago. There has been no significant change in his fatigue. He denies chest pain. There is no significant shortness of breath.   Current Medications (verified): 1)  Coumadin 5 Mg Tabs (Warfarin Sodium) .... Use As Directed By Anticoagulation Clinic. 2)  Bayer Aspirin Ec Low Dose 81 Mg  Tbec (Aspirin) .... Take 1 Tablet By Mouth Once A Day 3)  Carvedilol 25 Mg Tabs (Carvedilol) .... Take One Tablet By Mouth Twice A Day 4)  Lisinopril 10 Mg Tabs (Lisinopril) .... Take One Tablet By Mouth Daily 5)  Lasix 40 Mg  Tabs (Furosemide) .... Take 1 Tablet By Mouth Once A Day 6)  Spironolactone 25 Mg Tabs (Spironolactone) .... Take 1 Tablet By Mouth Daily 7)  Simvastatin 20 Mg Tabs (Simvastatin) .... Take One Tablet By Mouth Daily At Bedtime 8)  Mercaptopurine 50 Mg  Tabs (Mercaptopurine) .... Take 1.5 Tabs By Mouth Once Daily As Directed By Drchowdhury... 9)  Vitamin B-12 1000 Mcg Tabs (Cyanocobalamin) .... Take One Tablet By Mouth Once Daily. 10)  Folic Acid 1 Mg  Tabs (Folic Acid) .... Take 1 Tablet By Mouth Once A Day 11)  Vitamin D 1000 Unit Tabs (Cholecalciferol) .... Take 1-2 Tablets By Mouth Once Daily  Allergies (verified): No Known Drug Allergies  Past History:  Past Medical History: Last updated: 12/09/2009 Positional dizziness.... may, 2011 HYPERTENSION (ICD-401.9) CARDIOMYOPATHY, ISCHEMIC (ICD-414.8) CAD CABG 1994 CHF.... chronic systolic  EF 31%... echo... November, 2009 Bradycardia....  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)-EXPLANTED 2011 IMPLANTED DEFIBRILLATOR-Guidant Cognis device, model [id number] LBBB (ICD-426.3) HYPERLIPIDEMIA (ICD-272.4) GERD (ICD-530.81) COLONIC POLYPS (ICD-211.3) CROHN'S DISEASE (ICD-555.9) Hx of ACUTE VASCULAR INSUFFICIENCY OF INTESTINE (ICD-557.0) VENTRAL HERNIA (ICD-553.20) HYPERTROPHY PROSTATE W/UR OBST & OTH LUTS (ICD-600.01) DEGENERATIVE JOINT DISEASE (ICD-715.90) Hx of POLYMYALGIA RHEUMATICA (ICD-725) TREMOR (ICD-781.0) ANXIETY (ICD-300.00) Hx of SHINGLES (ICD-053.9) VITAMIN B12 DEFICIENCY (ICD-266.2) VITAMIN D DEFICIENCY (ICD-268.9)  Vital Signs:  Patient profile:   77 year old male Height:      72 inches Weight:      200.25 pounds BMI:     27.26 Pulse rate:   52 / minute Pulse rhythm:   irregular BP sitting:   110 / 74  (left arm) Cuff size:   large  Vitals Entered By: Julaine Hua, CMA (January 07, 2010 11:37 AM)  Physical Exam  General:  The patient was alert and oriented in no acute distress. HEENT Normal.  Neck veins were flat, carotids were brisk.  Lungs were clear.  Heart sounds were regular without murmurs,+s4 Abdomen was soft with active bowel sounds. There is no clubbing cyanosis or edema. Skin Warm and dry     ICD Specifications Following MD:  Virl Axe, MD     ICD Vendor:  [place] Number:  [id number]     ICD Serial Number:  [id number] ICD DOI:  11/27/2009     ICD Implanting MD:  Virl Axe, MD  Lead 1:    Location:  RA     DOI: 09/07/2004     Model #: [id number]     Serial #: [id number]     Status: active Lead 2:    Location: RV     DOI: 09/07/2004     Model #: [id number]     Serial #: [id number]     Status: active Lead 3:    Location: LV     DOI: 10/11/2004     Model #: [id number]     Serial #: [id number]     Status: active  Indications::  ICM, CHF, MADIT-CRT  Explantation Comments: 11/27/2009 [employer] Contak H170/361980 explanted  ICD Follow Up Battery Voltage:   GOOD V     Charge Time:  8.0 seconds     Battery Est. Longevity:  7 YRS Underlying rhythm:  SR ICD Dependent:  No       ICD Device Measurements Atrium:  Amplitude: 5.9 mV, Impedance: 489 ohms, Threshold: 0.7 V at 0.4 msec Right Ventricle:  Amplitude: 25.0 mV, Impedance: 699 ohms, Threshold: 0.8 V at 0.4 msec Left Ventricle:  Amplitude: 25.0 mV, Impedance: 462 ohms, Threshold: 0.9 V at 0.8 msec Shock Impedance: 61 ohms   Episodes MS Episodes:  0     Coumadin:  Yes Shock:  0     ATP:  0     Nonsustained:  0     Atrial Therapies:  0 Atrial Pacing:  71%     Ventricular Pacing:  96%  Brady Parameters Mode DDD     Lower Rate Limit:  60     Upper Rate Limit 130 PAV 170      Tachy Zones VF:  210     VT:  180     Next Remote Date:  04/08/2010     Tech Comments:  NORMAL DEVICE FUNCTION.  HISTOGRAMS FLAT AND PT COMPLAINING OF HAVING NO ENERGY.  CHANGED ACCELEROMETER RESPONSE FACTOR FROM 8 TO 11.  LATITUDE 04-08-10. Shelly Bombard  January 07, 2010 12:02 PM  Impression & Recommendations:  Problem # 1:  SINUS BRADYCARDIA (ICD-427.81) The patient is chronotropically  incompetent. Since having been seen in October, there are essentially no heart beat faster than 80. We have reprogrammed his rate response today and I walked in he is able to generate a heart rate of 80 and we may get brisker still.  In addition; because of the fatigue and the potential contribution of his carvedilol, I had decreased her dose from 25-6.25 b.i.d. When he sees Dr. [name] in February, this can be readjusted The following medications were removed from the medication list:    Bayer Aspirin Ec Low Dose 81 Mg Tbec (Aspirin) .[name]... Take 1 tablet by mouth once a day His updated medication list for this problem includes:    Coumadin 5 Mg Tabs (Warfarin sodium) ..... Use as directed by anticoagulation clinic.    Carvedilol 25 Mg Tabs (Carvedilol) .[name]... Take one quarter tablet by mouth twice a day    Lisinopril 10 Mg Tabs  (Lisinopril) .[name]... Take one tablet by mouth daily  Problem # 2:  IMPLANTABLE DEFIBRILLATOR, GDT CONTAK H170-MADIT-CRT (ICD-V45.02) Device parameters and data were reviewed and changes were made as noted.  Problem # 3:  CORONARY ATHEROSCLEROSIS NATIVE CORONARY ARTERY (ICD-414.01) stable The following medications were removed from the medication list:    Bayer Aspirin Ec Low Dose 81 Mg Tbec (Aspirin) .[name]... Take 1 tablet by mouth once a day His updated medication list for this problem includes:  Coumadin 5 Mg Tabs (Warfarin sodium) ..... Use as directed by anticoagulation clinic.    Carvedilol 25 Mg Tabs (Carvedilol) .[name]... Take one quarter tablet by mouth twice a day    Lisinopril 10 Mg Tabs (Lisinopril) .[name]... Take one tablet by mouth daily  Problem # 4:  FATIGUE (ICD-780.79) as above  Problem # 5:  * COUMADIN LONG TERM is not clear to me why he is on Coumadin; I have however taken the liberty of stopping the aspirin because of potentiation of risk of bleeding  Patient Instructions: 1)  Your physician has recommended you make the following change in your medication: Stop Aspirin, Take 1/4 tablet twice daily.  2)  Your physician wants you to follow-up in: 6 months   You will receive a reminder letter in the mail two months in advance. If you don't receive a letter, please call our office to schedule the follow-up appointment.

## 2010-03-02 NOTE — Procedures (Signed)
Summary: device check add-on per amber pt having av opt echo @ 11:30    Allergies: No Known Drug Allergies  Primary Provider:  Teressa Lower, MD   History of Present Illness: coems in  for AV optimization echo    Impression & Recommendations:  Problem # 1:  CHRONIC SYSTOLIC HEART FAILURE (UUE-280.03) redid av delays based on echo with AAV 170->>200 and LV offset -40>>>-20  His updated medication list for this problem includes:    Coumadin 5 Mg Tabs (Warfarin sodium) ..... Use as directed by anticoagulation clinic.    Bayer Aspirin Ec Low Dose 81 Mg Tbec (Aspirin) .[name]... Take 1 tablet by mouth once a day    Carvedilol 25 Mg Tabs (Carvedilol) .[name]... Take one tablet by mouth twice a day    Lisinopril 10 Mg Tabs (Lisinopril) .[name]... Take one tablet by mouth daily    Lasix 40 Mg Tabs (Furosemide) .[name]... Take 1 tablet by mouth once a day    Spironolactone 25 Mg Tabs (Spironolactone) .[name]... Take 1 tablet by mouth daily    ICD Specifications Following MD:  Virl Axe, MD     ICD Vendor:  [facility] [employer]     ICD Model Number:  [phone]     ICD Serial Number:  [id number] ICD DOI:  11/27/2009     ICD Implanting MD:  Virl Axe, MD  Lead 1:    Location: RA     DOI: 09/07/2004     Model #: [id number]     Serial #: [id number]     Status: active Lead 2:    Location: RV     DOI: 09/07/2004     Model #: 3748     Serial #: [id number]     Status: active Lead 3:    Location: LV     DOI: 10/11/2004     Model #: [id number]     Serial #: [id number]     Status: active  Indications::  ICM, CHF, MADIT-CRT  Explantation Comments: 11/27/2009 [employer] Contak H170/361980 explanted  ICD Follow Up Remote Check?  No Charge Time:  8.0 seconds     Battery Est. Longevity:  6.5 years ICD Dependent:  No       ICD Device Measurements Atrium:  Amplitude: 6.0 mV, Impedance: 469 ohms, Threshold: 0.9 V at 0.4 msec Right Ventricle:  Amplitude: 21 mV, Impedance: 655 ohms, Threshold: 0.7 V at 0.4 msec Left Ventricle:  Amplitude: 16.9  mV, Impedance: 439 ohms, Threshold: 1.1 V at 0.8 msec Shock Impedance: 57 ohms   Episodes MS Episodes:  0     Percent Mode Switch:  0     Coumadin:  Yes Shock:  0     ATP:  0     Nonsustained:  0     Atrial Pacing:  22%     Ventricular Pacing:  77%  Brady Parameters Mode DDD     Lower Rate Limit:  60     Upper Rate Limit 200 PAV 170      Tachy Zones VF:  210     VT:  180     Tech Comments:  AV opt done today with [facility].  Reprogrammed as above.  ROV 4 weeks with Dr. Caryl Comes. Alma Friendly, LPN  December 11, 2195 1:00 PM

## 2010-03-02 NOTE — Progress Notes (Signed)
Summary: refill   Phone Note Refill Request Message from:  Patient on February 13, 2009 2:28 PM  Refills Requested: Medication #1:  CARVEDILOL 25 MG TABS Take one tablet by mouth twice a day   Supply Requested: 3 months  Medication #2:  KLOR-CON 20 MEQ  PACK Take 1 tablet by mouth once a day   Supply Requested: 3 months  Medication #3:  LISINOPRIL 20 MG TABS Take one tablet by mouth daily   Supply Requested: 3 months  Medication #4:  SIMVASTATIN 20 MG TABS Take one tablet by mouth daily at bedtime   Supply Requested: 3 months Lasix 1m 1 tab daily Warfarin 164m3 tabs daily  ** New Pharmacy** [facility] fax [phone] Method Requested: [facility] call taken by: JeDarnell Level February 13, 2009 2:28 PM  Follow-up for Phone Call        Called spoke with pt's wife.  Advised rxs requested have been sent electronically to [facility] as requested.   Follow-up by: ErFreddrick MarchN,  February 16, 2009 10:30 AM    Prescriptions: COUMADIN 5 MG TABS (WARFARIN SODIUM) Use as directed by anticoagulation clinic.  #270 x 3   Entered by:   ErFreddrick MarchN   Authorized by:   JeLorenza EvangelistMD, [facility] Signed by:   ErFreddrick MarchN on 02/16/2009   Method used:   [facility]             ,          Ph: [phone]     Fax: [phone] RxID: :   [id number] 20 MG TABS (SIMVASTATIN) Take one tablet by mouth daily at bedtime  #90 x 3   Entered by:   ShMignon PineRMA   Authorized by:   JeLorenza EvangelistMD, FA[facility] Signed by:   ShMignon PineRMA on 02/16/2009   Method used:   [facility]        [facility]             ,          Ph: [phone]     Fax: [phone] RxID:   [id number]-CON 20 MEQ  PACK (POTASSIUM CHLORIDE) Take 1 tablet by mouth once a day  #90 x 3   Entered by:   ShMignon PineRMA   Authorized by:   JeLorenza EvangelistMD,  FAAure[facility] Signed by:   ShMignon PineRMA on 02/16/2009   Method used:   [facility]        [facility]             ,          Ph: [phone]     Fax: [phone] RxID: :   [id number] 40 MG  TABS (FUROSEMIDE) Take 1 tablet by mouth once a day  #90 x 3   Entered by:   ShMignon PineRMA   Authorized by:   JeLorenza EvangelistMD, FANew [facility] Signed by:   ShMignon PineRMA on 02/16/2009   Method used:   [facility]        [facility]            ,  Ph: [phone]       Fax: [phone]   RxID:   [id number] LISINOPRIL 20 MG TABS (LISINOPRIL) Take one tablet by mouth daily  #90 x 3   Entered by:   Mignon Pine, RMA   Authorized by:   Lorenza Evangelist, MD, [facility]   Signed by:   Mignon Pine, RMA on 02/16/2009   Method used:   Electronically to        [facility] (mail-order)             ,          Ph: [phone]       Fax: [phone]   RxID:   [id number] CARVEDILOL 25 MG TABS (CARVEDILOL) Take one tablet by mouth twice a day  #180 x 3   Entered by:   Mignon Pine, RMA   Authorized by:   Lorenza Evangelist, MD, [facility]   Signed by:   Mignon Pine, RMA on 02/16/2009   Method used:   Electronically to        [place] (mail-order)             ,          Ph: [phone]       Fax: [phone]   RxID:   [id number]

## 2010-03-03 ENCOUNTER — Ambulatory Visit: Admit: 2010-03-03 | Payer: Self-pay | Admitting: Cardiology

## 2010-03-03 ENCOUNTER — Ambulatory Visit (INDEPENDENT_AMBULATORY_CARE_PROVIDER_SITE_OTHER): Payer: Medicare Other | Admitting: Cardiology

## 2010-03-03 ENCOUNTER — Encounter: Payer: Self-pay | Admitting: Cardiology

## 2010-03-03 DIAGNOSIS — I6529 Occlusion and stenosis of unspecified carotid artery: Secondary | ICD-10-CM

## 2010-03-04 NOTE — Miscellaneous (Signed)
Summary: corrected device information  Clinical Lists Changes  Observations: Added new observation of ICDLEADSER3: [id number] (02/25/2010 8:10)       ICD Specifications Following MD:  Virl Axe, MD     ICD Vendor:  [place]     ICD Model Number:  [phone]     ICD Serial Number:  [id number] ICD DOI:  11/27/2009     ICD Implanting MD:  Virl Axe, MD  Lead 1:    Location: RA     DOI: 09/07/2004     Model #: [id number]     Serial #: [id number]     Status: active Lead 2:    Location: RV     DOI: 09/07/2004     Model #: [id number]     Serial #: [id number]     Status: active Lead 3:    Location: LV     DOI: 10/11/2004     Model #: [id number]     Serial #: [id number]     Status: active  Indications::  ICM, CHF, MADIT-CRT  Explantation Comments: 11/27/2009 [employer] Contak [id number] explanted  ICD Follow Up ICD Dependent:  No      Episodes Coumadin:  Yes  Brady Parameters Mode DDD     Lower Rate Limit:  60     Upper Rate Limit 130 PAV 170      Tachy Zones VF:  210     VT:  180

## 2010-03-04 NOTE — Medication Information (Signed)
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 yes    Recent/future dental: no  Any missed doses?: no       Is patient compliant with meds? yes       Allergies: No Known Drug Allergies  Anticoagulation Management History:      The patient is taking warfarin and comes in today for a routine follow up visit.  Positive risk factors for bleeding include an age of 75 years or older.  The bleeding index is 'intermediate risk'.  Positive CHADS2 values include History of CHF, History of HTN, and Age > 66 years old.  The start date was 02/13/2000.  His last INR was 1.8 ratio.  Anticoagulation responsible provider: Burt Knack MD, Legrand Como.  INR POC: 1.9.  Cuvette Lot#: [id number].  Exp: 03/2011.    Anticoagulation Management Assessment/Plan:      The patient's current anticoagulation dose is Coumadin 5 mg tabs: Use as directed by anticoagulation clinic..  The target INR is 2.0-3.0.  The next INR is due 03/08/2010.  Anticoagulation instructions were given to patient.  Results were reviewed/authorized by Porfirio Oar, PharmD.  He was notified by Nilda Simmer, PharmD Candidate .         Prior Anticoagulation Instructions: INR 2.0  Continue same dose of 3 tablets every day except 4 tablets on Monday and Friday.  Recheck INR in 4 weeks.   Current Anticoagulation Instructions: INR 1.9   Coumadin 5 mg tablets - Take an extra tablet today, then resume 3 tablets daily except 4 tablets on Monday and Friday

## 2010-03-04 NOTE — Progress Notes (Signed)
Phone Note Outgoing Call   Call placed by: rhonda Call placed to: Patient Details for Reason: refill request Summary of Call: inadvertently deleted pt request from earlier. [facility]- [phone] Initial call taken by: Joan Flores RN,  February 16, 2010 12:08 PM  Follow-up for Phone Call        adv Mr. Bynum would get refills done.  Follow-up by: Joan Flores RN,  February 16, 2010 12:11 PM  Additional Follow-up for Phone Call Additional follow up Details #1::        spoke w/Mrs. Fairburn adv could not find prescription provider, she [facility] is [facility]. will attempt refills with that provider. 02/17/10 1030    Prescriptions: SIMVASTATIN 20 MG TABS (SIMVASTATIN) Take one tablet by mouth daily at bedtime  #90 x 3   Entered by:   Joan Flores RN   Authorized by:   Nikki Dom, MD, [facility]   Signed by:   Joan Flores RN on 02/17/2010   Method used:   Faxed to ...       [facility] (retail)             , [place]         Ph: [phone]       Fax: [phone]   RxID:   [id number] SPIRONOLACTONE 25 MG TABS (SPIRONOLACTONE) Take 1 tablet by mouth daily  #90 x 3   Entered by:   Joan Flores RN   Authorized by:   Nikki Dom, MD, [facility]   Signed by:   Joan Flores RN on 02/17/2010   Method used:   Faxed to ...       [facility] (retail)             , [place]         Ph: [phone]       Fax: [phone]   RxID:   [id number] LASIX 40 MG  TABS (FUROSEMIDE) Take 1 tablet by mouth once a day  #90 x 3   Entered by:   Joan Flores RN   Authorized by:   Nikki Dom, MD, [facility]   Signed by:   Joan Flores RN on 02/17/2010   Method used:   Faxed to ...       [facility] (retail)             , [place]         Ph: [phone]       Fax: [phone]   RxID:   [id number] LISINOPRIL 10 MG TABS (LISINOPRIL) Take one tablet by mouth daily  #90 x 3  Entered by:   Joan Flores RN   Authorized by:   Nikki Dom, MD, [facility]   Signed by:   Joan Flores RN on 02/17/2010   Method used:   Faxed to ...       [facility] (retail)             , [place]         Ph: [phone]       Fax: [phone]   RxID:   [id number] CARVEDILOL 25 MG TABS (CARVEDILOL) Take one quarter tablet by mouth twice a day  #45 x 3   Entered by:   Joan Flores RN   Authorized by:   Nikki Dom, MD, [facility]   Signed by:   Joan Flores RN on 02/17/2010   Method used:   Faxed to .[facility]  [facility] (retail)             , [place]         Ph: [phone]       Fax: [phone]   RxID:   [id number] COUMADIN 5 MG TABS (WARFARIN SODIUM) Use as directed by anticoagulation clinic.  #270 x 3   Entered by:   Joan Flores RN   Authorized by:   Nikki Dom, MD, [facility]   Signed by:   Joan Flores RN on 02/17/2010   Method used:   Faxed to ...       [facility] (retail)             , [place]         Ph: [phone]       Fax: [phone]   RxID:   [id number]

## 2010-03-04 NOTE — Medication Information (Signed)
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 yes       Details: ASA d/c'ed and decreased coreg  Recent/future dental: no  Any missed doses?: no       Is patient compliant with meds? yes       Allergies: No Known Drug Allergies  Anticoagulation Management History:      The patient is taking warfarin and comes in today for a routine follow up visit.  Positive risk factors for bleeding include an age of 75 years or older.  The bleeding index is 'intermediate risk'.  Positive CHADS2 values include History of CHF, History of HTN, and Age > 75 years old.  The start date was 02/13/2000.  His last INR was 1.8 ratio.  Anticoagulation responsible provider: Olevia Perches MD, Darnell Level.  INR POC: 2.0.  Exp: 01/2011.    Anticoagulation Management Assessment/Plan:      The patient's current anticoagulation dose is Coumadin 5 mg tabs: Use as directed by anticoagulation clinic..  The target INR is 2.0-3.0.  The next INR is due 02/08/2010.  Anticoagulation instructions were given to patient.  Results were reviewed/authorized by Porfirio Oar, PharmD.  He was notified by Porfirio Oar PharmD.         Prior Anticoagulation Instructions: INR 2.1 Continue previous dose of 3 tablets everyday except 4 tablets on Monday and Friday Recheck INR in 2 weeks  Current Anticoagulation Instructions: INR 2.0  Continue same dose of 3 tablets every day except 4 tablets on Monday and Friday.  Recheck INR in 4 weeks.

## 2010-03-10 ENCOUNTER — Encounter: Payer: Self-pay | Admitting: Internal Medicine

## 2010-03-10 ENCOUNTER — Encounter (INDEPENDENT_AMBULATORY_CARE_PROVIDER_SITE_OTHER): Payer: Medicare Other

## 2010-03-10 ENCOUNTER — Encounter: Payer: Self-pay | Admitting: Cardiology

## 2010-03-10 DIAGNOSIS — I6529 Occlusion and stenosis of unspecified carotid artery: Secondary | ICD-10-CM

## 2010-03-10 DIAGNOSIS — R0989 Other specified symptoms and signs involving the circulatory and respiratory systems: Secondary | ICD-10-CM

## 2010-03-10 DIAGNOSIS — Z7901 Long term (current) use of anticoagulants: Secondary | ICD-10-CM

## 2010-03-10 DIAGNOSIS — B361 Tinea nigra: Secondary | ICD-10-CM

## 2010-03-10 LAB — CONVERTED CEMR LAB: POC INR: 2.1

## 2010-03-10 NOTE — Assessment & Plan Note (Signed)
Summary: 31f   Vital Signs:  Patient profile:   77year old male Height:      72 inches Weight:      203 pounds BMI:     27.63 Pulse rate:   70 / minute BP sitting:   114 / 66  (left arm) Cuff size:   regular  Vitals Entered By: SMignon Pine RMA (March 03, 2010 10:49 AM)  Visit Type:  Follow-up Primary Provider:  STeressa Lower MD  CC:  cardiomyopathy.  History of Present Illness: The patient is seen for followup of cardiomyopathy.  He is feeling relatively well.  I saw him last August, 2011.  Since that time he has had ICD optimization area.  He is seeing Dr.Klein.  He does have chronotropic incompetence.  His rate response was reprogrammed.  He was then walked in his heart rate went up to 80.  He says that he still has fatigue at rest.  He has not had chest pain.  There is no significant shortness of breath.  Before the patient's ICD was upgraded he had a nuclear scan November 23, 2009.  This revealed extensive old scar.  There was no significant ischemia.  Ejection fraction was 34%.  Current Medications (verified): 1)  Coumadin 5 Mg Tabs (Warfarin Sodium) .... Use As Directed By Anticoagulation Clinic. 2)  Carvedilol 25 Mg Tabs (Carvedilol) .... Take One Quarter Tablet By Mouth Twice A Day 3)  Lisinopril 10 Mg Tabs (Lisinopril) .... Take One Tablet By Mouth Daily 4)  Lasix 40 Mg  Tabs (Furosemide) .... Take 1 Tablet By Mouth Once A Day 5)  Spironolactone 25 Mg Tabs (Spironolactone) .... Take 1 Tablet By Mouth Daily 6)  Simvastatin 20 Mg Tabs (Simvastatin) .... Take One Tablet By Mouth Daily At Bedtime 7)  Mercaptopurine 50 Mg  Tabs (Mercaptopurine) .... Take 1.5 Tabs By Mouth Once Daily As Directed By Drchowdhury... 8)  Vitamin B-12 1000 Mcg Tabs (Cyanocobalamin) .... Take One Tablet By Mouth Once Daily. 9)  Folic Acid 1 Mg  Tabs (Folic Acid) .... Take 1 Tablet By Mouth Once A Day 10)  Vitamin D 1000 Unit Tabs (Cholecalciferol) .... Take 1-2 Tablets By Mouth Once  Daily  Allergies (verified): No Known Drug Allergies  Past History:  Past Medical History: Positional dizziness.... may, 2011 HYPERTENSION (ICD-401.9) CARDIOMYOPATHY, ISCHEMIC (ICD-414.8) CAD  catheterization 1996... occluded vein graft to the diagonal, other grafts patent /  catheterization 2006, no PCI  /   nuclear.. October, 2011..Marland Kitchenextensive scar anteroseptal and apical... no ischemia CABG 1994 CHF.... chronic systolic  Mitral regurgitation   mild... echo.. November 2 011 EF 35%... echo... November, 2009 /  EF 35%... echo.. November, 2011 Bradycardia.... sinus Chronotropic incompetence IMPLANTABLE DEFIBRILLATOR, GDT CONTAK H170-MADIT-CRT (ICD-V45.02)-EXPLANTED 2011 IMPLANTED DEFIBRILLATOR-Guidant Cognis device, model [id number] Dr.Klein (November 27, 2009) LBBB (ICD-426.3) HYPERLIPIDEMIA (ICD-272.4) GERD (ICD-530.81) COLONIC POLYPS (ICD-211.3) CROHN'S DISEASE (ICD-555.9) Mesenteric embolus... surgery... 2003 ACUTE VASCULAR INSUFFICIENCY OF INTESTINE (ICD-557.0) VENTRAL HERNIA (ICD-553.20) HYPERTROPHY PROSTATE W/UR OBST & OTH LUTS (ICD-600.01) DEGENERATIVE JOINT DISEASE (ICD-715.90) Hx of POLYMYALGIA RHEUMATICA (ICD-725) TREMOR (ICD-781.0) ANXIETY (ICD-300.00) Hx of SHINGLES (ICD-053.9) VITAMIN B12 DEFICIENCY (ICD-266.2) VITAMIN D DEFICIENCY (ICD-268.9) Coumadin therapy    mesenteric embolus.... 2003 he  Physical Exam  General:  The patient is stable today. Head:  head is atraumatic. Eyes:  no xanthelasma. Neck:  no jugular venous attention. There is a soft right carotid bruit. Chest Wall:  no chest wall tenderness. Lungs:  lungs are clear.  Respiratory effort is  nonlabored. Heart:  cardiac exam reveals S1-S2.  No clicks or significant murmurs. Abdomen:  abdomen is soft. Msk:  no musculoskeletal deformities. Extremities:  no peripheral edema. Skin:  no skin rashes. Psych:  patient is oriented to person time and place affect is normal.  He speaks slowly.  This is his  usual speech pattern.   Impression & Recommendations:  Problem # 1:  * CHRONOTROPIC INCOMPETENCE The patient's heart rate is 70 today.  He does have chronotropic incompetence.  His heart rate did increase when his rate response parameters were changed.  I am not convinced that bradycardia is the current basis for his lack of energy.  He does not have exertional fatigue.  I've chosen not to change medications or ask for further changes in his pacing parameters at this time.  He has most recently had his carvedilol dose decreased to 6.25 b.i.d..  This dose will be continued.  Problem # 2:  CAROTID BRUIT, RIGHT (ICD-785.9)  Orders: Carotid Duplex (Carotid Duplex) The patient does have a soft carotid bruit. I have carefully reviewed all of our records.  I do not have documentation of a carotid Doppler within many years.  This will be arranged.  Problem # 3:  OTHER FLUID OVERLOAD (ICD-276.69) Fluid status is stable.  No change in therapy.  Problem # 4:  CAD (ICD-414.00)  Coronary disease is stable.  He had a nuclear scan in November, 2011.  There was no definite ischemia. The patient was taken off aspirin at the time of his last visit by Dr. Caryl Comes.  He was concerned about the possibility of bleeding risk with Coumadin and aspirin.  I considered whether we should restart aspirin for the patient's coronary disease.  At this point I've chosen to keep him off the aspirin.  His updated medication list for this problem includes:    Coumadin 5 Mg Tabs (Warfarin sodium) ..... Use as directed by anticoagulation clinic.    Lisinopril 10 Mg Tabs (Lisinopril) .[name]... Take one tablet by mouth daily  Problem # 5:  CHRONIC SYSTOLIC HEART FAILURE (KGU-542.70) The patient's heart failure appears to be stable.  I do not think that heart failure is the basis of his current resting fatigue.  No change in his meds today.  Problem # 6:  * COUMADIN LONG TERM The patient is on Coumadin because of a mesenteric  embolus that occurred many years ago.  He needs to remain on Coumadin indefinitely.  As part of today's evaluation I have reviewed my notes from August, 2011.  I have reviewed the nuclear scan from October, 2011.  I reviewed the data with AV optimization and then about the assessment of his chronotropic incompetence.  I have extensively reviewed old records trying to fine an old carotid Doppler. I am discussed extensively with the patient and his family his overall current status.  Other Orders: EKG w/ Interpretation (93000)  Patient Instructions: 1)  Your physician recommends that you schedule a follow-up appointment in:  3 months 2)  Your physician has recommended you make the following change in your medication:  Change Carvedilol to 6.5m two times a day  3)  Your physician has requested that you have a carotid duplex. This test is an ultrasound of the carotid arteries in your neck. It looks at blood flow through these arteries that supply the brain with blood. Allow one hour for this exam. There are no restrictions or special instructions. Prescriptions: CARVEDILOL 6.25MG Take one tablet by mouth two times  a day  #180 x 4   Entered by:   Rhett Bannister  LPN   Authorized by:   Lorenza Evangelist, MD, [facility]   Signed by:   Rhett Bannister  LPN on 56/15/3794   Method used:   Faxed to ...       [facility]             , [place]         Ph:        Fax: [phone]   RxID:   [id number] ATORVASTATIN CALCIUM 40 MG TABS (ATORVASTATIN CALCIUM) Take one tablet by mouth at bedtime  #30 x 11   Entered by:   Rhett Bannister  LPN   Authorized by:   Lorenza Evangelist, MD, [facility]   Signed by:   Rhett Bannister  LPN on 38/18/4037   Method used:   Print then Give to Patient   RxID:   [id number]    Orders Added: 1)  EKG w/ Interpretation [93000] 2)  Carotid Duplex [Carotid Duplex]

## 2010-03-10 NOTE — Miscellaneous (Signed)
  Clinical Lists Changes  Observations: Added new observation of PAST MED HX: Positional dizziness.... may, 2011 HYPERTENSION (ICD-401.9) CARDIOMYOPATHY, ISCHEMIC (ICD-414.8) CAD  catheterization 1996... occluded vein graft to the diagonal, other grafts patent  /   nuclear.. October, 2011.[name] extensive scar anteroseptal and apical... no ischemia CABG 1994 CHF.... chronic systolic  Mitral regurgitation   mild... echo.. November 2 011 EF 35%... echo... November, 2009 /  EF 35%... echo.. November, 2011 Bradycardia.... 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)-EXPLANTED 2011 IMPLANTED DEFIBRILLATOR-Guidant Cognis device, model [id number]  Dr.Klein LBBB (ICD-426.3) HYPERLIPIDEMIA (ICD-272.4) GERD (ICD-530.81) COLONIC POLYPS (ICD-211.3) CROHN'S DISEASE (ICD-555.9) Mesenteric embolus... surgery... 2003 ACUTE VASCULAR INSUFFICIENCY OF INTESTINE (ICD-557.0) VENTRAL HERNIA (ICD-553.20) HYPERTROPHY PROSTATE W/UR OBST & OTH LUTS (ICD-600.01) DEGENERATIVE JOINT DISEASE (ICD-715.90) Hx of POLYMYALGIA RHEUMATICA (ICD-725) TREMOR (ICD-781.0) ANXIETY (ICD-300.00) Hx of SHINGLES (ICD-053.9) VITAMIN B12 DEFICIENCY (ICD-266.2) VITAMIN D DEFICIENCY (ICD-268.9) Coumadin therapy    mesenteric embolus.... 2003 he (03/02/2010 13:16) Added new observation of PRIMARY MD: Teressa Lower, MD (03/02/2010 13:16)       Past History:  Past Medical History: Positional dizziness.... may, 2011 HYPERTENSION (ICD-401.9) CARDIOMYOPATHY, ISCHEMIC (ICD-414.8) CAD  catheterization 1996... occluded vein graft to the diagonal, other grafts patent  /   nuclear.. October, 2011.[name] extensive scar anteroseptal and apical... no ischemia CABG 1994 CHF.... chronic systolic  Mitral regurgitation   mild... echo.. November 2 011 EF 35%... echo... November, 2009 /  EF 35%... echo.. November, 2011 Bradycardia.... 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)-EXPLANTED  2011 IMPLANTED DEFIBRILLATOR-Guidant Cognis device, model [id number]  Dr.Klein LBBB (ICD-426.3) HYPERLIPIDEMIA (ICD-272.4) GERD (ICD-530.81) COLONIC POLYPS (ICD-211.3) CROHN'S DISEASE (ICD-555.9) Mesenteric embolus... surgery... 2003 ACUTE VASCULAR INSUFFICIENCY OF INTESTINE (ICD-557.0) VENTRAL HERNIA (ICD-553.20) HYPERTROPHY PROSTATE W/UR OBST & OTH LUTS (ICD-600.01) DEGENERATIVE JOINT DISEASE (ICD-715.90) Hx of POLYMYALGIA RHEUMATICA (ICD-725) TREMOR (ICD-781.0) ANXIETY (ICD-300.00) Hx of SHINGLES (ICD-053.9) VITAMIN B12 DEFICIENCY (ICD-266.2) VITAMIN D DEFICIENCY (ICD-268.9) Coumadin therapy    mesenteric embolus.... 2003 he

## 2010-03-12 DIAGNOSIS — K55069 Acute infarction of intestine, part and extent unspecified: Secondary | ICD-10-CM

## 2010-03-12 HISTORY — DX: Acute infarction of intestine, part and extent unspecified: K55.069

## 2010-03-18 NOTE — Medication Information (Signed)
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 no    Recent/future dental: no  Any missed doses?: no       Is patient compliant with meds? yes       Allergies: No Known Drug Allergies  Anticoagulation Management History:      Positive risk factors for bleeding include an age of 75 years or older.  The bleeding index is 'intermediate risk'.  Positive CHADS2 values include History of CHF, History of HTN, and Age > 59 years old.  The start date was 02/13/2000.  His last INR was 1.8 ratio.  Anticoagulation responsible provider: Caryl Comes MD, Remo Lipps.  INR POC: 2.1.  Cuvette Lot#: [id number].  Exp: 02/2011.    Anticoagulation Management Assessment/Plan:      The patient's current anticoagulation dose is Coumadin 5 mg tabs: Use as directed by anticoagulation clinic..  The target INR is 2.0-3.0.  The next INR is due 04/07/2010.  Anticoagulation instructions were given to patient.  Results were reviewed/authorized by Alycia Rossetti, PharmD.  He was notified by Alycia Rossetti PharmD.         Prior Anticoagulation Instructions: INR 1.9   Coumadin 5 mg tablets - Take an extra tablet today, then resume 3 tablets daily except 4 tablets on Monday and Friday   Current Anticoagulation Instructions: Continue current regimen of 3 tablets (15 mg) daily except for 4 tablets (20 mg) on Mondays and Fridays.  INR 2.1

## 2010-04-08 ENCOUNTER — Encounter: Payer: Self-pay | Admitting: Internal Medicine

## 2010-04-08 ENCOUNTER — Encounter (INDEPENDENT_AMBULATORY_CARE_PROVIDER_SITE_OTHER): Payer: Medicare Other

## 2010-04-08 ENCOUNTER — Encounter (INDEPENDENT_AMBULATORY_CARE_PROVIDER_SITE_OTHER): Payer: Self-pay | Admitting: *Deleted

## 2010-04-08 DIAGNOSIS — Z7901 Long term (current) use of anticoagulants: Secondary | ICD-10-CM

## 2010-04-08 DIAGNOSIS — B361 Tinea nigra: Secondary | ICD-10-CM

## 2010-04-08 LAB — CONVERTED CEMR LAB: POC INR: 1.8

## 2010-04-13 NOTE — Medication Information (Signed)
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 no    Recent/future dental: no  Any missed doses?: no       Is patient compliant with meds? yes       Allergies: No Known Drug Allergies  Anticoagulation Management History:      The patient is taking warfarin and comes in today for a routine follow up visit.  Positive risk factors for bleeding include an age of 75 years or older.  The bleeding index is 'intermediate risk'.  Positive CHADS2 values include History of CHF, History of HTN, and Age > 11 years old.  The start date was 02/13/2000.  His last INR was 1.8 ratio.  Anticoagulation responsible provider: Lovena Le MD, Carleene Overlie.  INR POC: 1.8.  Cuvette Lot#: [id number].  Exp: 02/2011.    Anticoagulation Management Assessment/Plan:      The patient's current anticoagulation dose is Coumadin 5 mg tabs: Use as directed by anticoagulation clinic..  The target INR is 2.0-3.0.  The next INR is due 05/06/2010.  Anticoagulation instructions were given to patient.  Results were reviewed/authorized by Freddrick March, RN, BSN.  He was notified by Freddrick March RN.         Prior Anticoagulation Instructions: Continue current regimen of 3 tablets (15 mg) daily except for 4 tablets (20 mg) on Mondays and Fridays.  INR 2.1  Current Anticoagulation Instructions: INR 1.8  Take 4 tablets today, then start taking 3 tablets daily except 4 tablets on Mondays, Wednesdays, and Fridays.  Recheck in 3-4 weeks.

## 2010-04-13 NOTE — Letter (Signed)
Summary: Device-Delinquent Check  [place] [place] [address] [place]   [place], [place] [address]   Phone: [phone]  Fax: [phone]     April 08, 2010 MRN: [id number]   KEAUNDRE THELIN [place], [place]  [address]   Dear Mr. Coy,  According to our records, you have not had your implanted device checked in the recommended period of time.  We are unable to determine appropriate device function without checking your device on a regular basis.  Please call our office to schedule an appointment as soon as possible.  If you are having your device checked by another physician, please call us so that we may update our records.  Thank you,  Gasper Sells, EMT  April 08, 2010 1:45 PM  [facility]

## 2010-04-14 LAB — APTT: aPTT: 39 seconds — ABNORMAL HIGH (ref 24–37)

## 2010-04-14 LAB — PROTIME-INR
INR: 1.55 — ABNORMAL HIGH (ref 0.00–1.49)
Prothrombin Time: 18.8 seconds — ABNORMAL HIGH (ref 11.6–15.2)

## 2010-04-15 ENCOUNTER — Encounter: Payer: Self-pay | Admitting: Internal Medicine

## 2010-04-15 ENCOUNTER — Encounter (INDEPENDENT_AMBULATORY_CARE_PROVIDER_SITE_OTHER): Payer: Medicare Other

## 2010-04-15 DIAGNOSIS — I509 Heart failure, unspecified: Secondary | ICD-10-CM

## 2010-04-15 DIAGNOSIS — I428 Other cardiomyopathies: Secondary | ICD-10-CM

## 2010-04-20 NOTE — Procedures (Signed)
Summary: defib check      Allergies Added: NKDA  Current Medications (verified): 1)  Coumadin 5 Mg Tabs (Warfarin Sodium) .... Use As Directed By Anticoagulation Clinic. 2)  Carvedilol 6.29m .... Take One Tablet By Mouth Two Times A Day 3)  Lisinopril 10 Mg Tabs (Lisinopril) .... Take One Tablet By Mouth Daily 4)  Lasix 40 Mg  Tabs (Furosemide) .... Take 1 Tablet By Mouth Once A Day 5)  Spironolactone 25 Mg Tabs (Spironolactone) .... Take 1 Tablet By Mouth Daily 6)  Simvastatin 20 Mg Tabs (Simvastatin) .... Take One Tablet By Mouth Daily At Bedtime 7)  Mercaptopurine 50 Mg  Tabs (Mercaptopurine) .... Take 1.5 Tabs By Mouth Once Daily As Directed By Drchowdhury... 8)  Vitamin B-12 1000 Mcg Tabs (Cyanocobalamin) .... Take One Tablet By Mouth Once Daily. 9)  Folic Acid 1 Mg  Tabs (Folic Acid) .... Take 1 Tablet By Mouth Once A Day 10)  Vitamin D 1000 Unit Tabs (Cholecalciferol) .... Take 1-2 Tablets By Mouth Once Daily  Allergies (verified): No Known Drug Allergies   ICD Specifications Following MD:  SVirl Axe MD     ICD Vendor:  BKechiNumber:  [id number]    ICD Serial Number:  [id number] DOI:  11/27/2009     ICD Implanting MD:  SVirl Axe MD  Lead 1:    Location: RA     DOI: 09/07/2004     Model #: [id number]    Serial #: [id number]    Status: active Lead 2:    Location: RV     DOI: 09/07/2004     Model #: [id number]    Serial #: [id number]    Status: active Lead 3:    Location: LV     DOI: 10/11/2004     Model #: [id number]    Serial #: [id number]    Status: active  Indications::  ICM, CHF, MADIT-CRT  Explantation Comments: 11/27/2009 [employer] Contak [id number] explanted  ICD Follow Up ICD Dependent:  No      Episodes Coumadin:  Yes  Brady Parameters Mode DDD     Lower Rate Limit:  60     Upper Rate Limit 130 PAV 170      Tachy Zones VF:  210     VT:  180     Tech Comments:  See PaceArt

## 2010-04-20 NOTE — Cardiovascular Report (Signed)
Summary: Office Visit   Office Visit   Imported By: Sallee Provencal 04/16/2010 16:10:17  _____________________________________________________________________  External Attachment:    Type:   Image     Comment:   External Document

## 2010-04-23 ENCOUNTER — Other Ambulatory Visit: Payer: Self-pay

## 2010-04-23 ENCOUNTER — Ambulatory Visit (INDEPENDENT_AMBULATORY_CARE_PROVIDER_SITE_OTHER): Payer: Medicare Other | Admitting: *Deleted

## 2010-04-23 DIAGNOSIS — I2589 Other forms of chronic ischemic heart disease: Secondary | ICD-10-CM

## 2010-04-23 DIAGNOSIS — Z95810 Presence of automatic (implantable) cardiac defibrillator: Secondary | ICD-10-CM

## 2010-04-23 DIAGNOSIS — I5022 Chronic systolic (congestive) heart failure: Secondary | ICD-10-CM

## 2010-04-28 NOTE — Progress Notes (Signed)
Remote ICD check

## 2010-05-02 ENCOUNTER — Encounter: Payer: Self-pay | Admitting: *Deleted

## 2010-05-06 ENCOUNTER — Ambulatory Visit (INDEPENDENT_AMBULATORY_CARE_PROVIDER_SITE_OTHER): Payer: Medicare Other | Admitting: *Deleted

## 2010-05-06 DIAGNOSIS — K55069 Acute infarction of intestine, part and extent unspecified: Secondary | ICD-10-CM

## 2010-05-06 DIAGNOSIS — Z7901 Long term (current) use of anticoagulants: Secondary | ICD-10-CM

## 2010-05-06 DIAGNOSIS — K55059 Acute (reversible) ischemia of intestine, part and extent unspecified: Secondary | ICD-10-CM

## 2010-05-18 ENCOUNTER — Encounter: Payer: Self-pay | Admitting: Pulmonary Disease

## 2010-05-19 ENCOUNTER — Ambulatory Visit (INDEPENDENT_AMBULATORY_CARE_PROVIDER_SITE_OTHER): Payer: Medicare Other | Admitting: Pulmonary Disease

## 2010-05-19 ENCOUNTER — Encounter: Payer: Self-pay | Admitting: Pulmonary Disease

## 2010-05-19 DIAGNOSIS — E785 Hyperlipidemia, unspecified: Secondary | ICD-10-CM

## 2010-05-19 DIAGNOSIS — I10 Essential (primary) hypertension: Secondary | ICD-10-CM

## 2010-05-19 DIAGNOSIS — N138 Other obstructive and reflux uropathy: Secondary | ICD-10-CM

## 2010-05-19 DIAGNOSIS — M1990 Unspecified osteoarthritis, unspecified site: Secondary | ICD-10-CM

## 2010-05-19 DIAGNOSIS — N401 Enlarged prostate with lower urinary tract symptoms: Secondary | ICD-10-CM

## 2010-05-19 DIAGNOSIS — I2510 Atherosclerotic heart disease of native coronary artery without angina pectoris: Secondary | ICD-10-CM

## 2010-05-19 DIAGNOSIS — K5090 Crohn's disease, unspecified, without complications: Secondary | ICD-10-CM

## 2010-05-19 DIAGNOSIS — K439 Ventral hernia without obstruction or gangrene: Secondary | ICD-10-CM

## 2010-05-19 DIAGNOSIS — I5022 Chronic systolic (congestive) heart failure: Secondary | ICD-10-CM

## 2010-05-19 DIAGNOSIS — I2589 Other forms of chronic ischemic heart disease: Secondary | ICD-10-CM

## 2010-05-19 DIAGNOSIS — F411 Generalized anxiety disorder: Secondary | ICD-10-CM

## 2010-05-19 MED ORDER — HYDROCORTISONE 2.5 % RE CREA: TOPICAL_CREAM | RECTAL | Status: DC

## 2010-05-19 MED ORDER — PREDNISONE 20 MG PO TABS: ORAL_TABLET | ORAL | Status: DC

## 2010-05-19 NOTE — Progress Notes (Signed)
Subjective:    Patient ID: [name], male    DOB: 19-Mar-1931, 77 y.o.   MRN: [id number]  HPI [name] here for a follow up visit... he has multiple medical problems as noted below...    ~  [place]owdhury [place] w/ sm bowel Crohn's- s/p mult resections w/ complications, stable on 6-MP for prevention of recurrence... also has sl incr fasting bili c/w Gilbert's... ~  followed by [name] for Cards- w/ CAD, s/p CABG, Cardiomyopathy w/ AICD- stable... f/u 2DEcho 11/09 showed diffuseHK w/ EF= 35% & DD, mod incr AoV thickness w/o AS... EKG= pacer rhythm.  ~  July 21, 2009:  he saw [facility] 6/11 w/ some incr edema & SPIRONOLACTONE 12.27m/d started & KCl stopped... he notes dizziness improved, still w/ mult somatic complaints & we discussed holding tight for now.  ~  November 18, 2009:  he has maintained regular f/u w/ Cards- DBenay Spice8/11 & DrKlein 10/11- pacer generator near EOL and pt noted decr energy "I'm slowing down, harder to get going";  they plan Myoview 1st, then device generator replacement... he is due for a follow up appt w/ DrChowdury as well... due for Flu shot today & f/u fasting labs (see below)...  ~  May 19, 2010:  660moOV & he notes that his Crohn's has been acting up- followed by GI in [place] on 6MP & ?cholestyramine added?  He wants Prednisone restarted & I asked him to call GI- now DrHolmes to discuss, in the meanwhile we will give him a Pred taper, but he understands that a regular dose of this med requires GI to follow response;      He saw DrBenay Spice/12 & DrKlein 12/11- cardiomyop stable (extensive old scar, no ischemia, EF=34%), CDopplers no change, AICD replaced 10/11 & then reprogrammed for chronotropic incompetence, Coreg decr to 6.25Bid...  He had labs 10/11 & these are reviewed w/ pt & wife...         Problem List:  HBP, CAD, Cardiomyopathy - followed by DrBenay Spicend DrKlein... he had CABG in 1994 (with cath in 96 revealing a total right and an 80% OM, vein  graft to the diagonal was occluded, other bypasses were patent), Biv AICD in 2006...  On COUMADIN (follow in clinic), ASA 8162m,  COREG 6.6m13m,  LISINOPRIL 20mg73m LASIX 40mg/60mSPIRONOLACTONE 6mg/d46m~  Cardiolite 3/03 w/ EF=37%...  ~  Cath 6/06 w/ native vessel and graft disease...  ~  2DEcho 11/09 stable w/ mod LVD- diffuse HK w/ EF= 35%, DD, mod incr AoV thickness w/o AS...  ~  3/11:  CXR showed stable CABG, AICD, & chr changes... ~  10/11:  Myoview showed extensive scar, no ischemia, EF= 34% ~  10/11:  New AICD placed by DrKlein... ~  11/11:  2DEcho showed mod reduced LV sys function w/ EF= 30-35%, diffuse HK, gr1 DD> see report... ~  4/12> BP= 122/80, but CC still is weakness, asked to continue same meds regularly...  HYPERLIPIDEMIA - he takes SIMVASTATIN 20mg/d.45me's had min, non-progressive incr LFTs noted. ~  FLP 4/08Landessowed TChol 138, TG 138, HDL 38, LDL 72... tolerating med well. ~  FLP 4/09[place]owed TChol 118, TG 111, HDL 33, LDL 63 ~  FLP 12/09 showed TChol 118, TG 100, HDL 31, LDL 67 ~  FLP 10/10 showed TChol 111, TG 139, HDL 36, LDL 47 ~  FLP 10/11 showed TChol 101, TG 102, HDL 35, LDL 45  GERD (ICD-530.81) -  no active symptoms and not currently taking PPI etc...  COLONIC POLYPS (ICD-211.3) - last colonoscopy here was 7/04 by Demetra Shiner w/ divertics and several polyps from 6 - 34m size, reportedly adenomatous on bx but I cannot find the path reports... he is overdue for f/u colon... ~  labs 2/10 showed Hg= 15.7..Marland KitchenMarland Kitchenwe will send copy of note to DrChowdhury w/ request for f/u colonoscopy. ~  f/u colonoscopy 11/10 by DrChowdhury showed norm TI & several polyps removed- path +tubular adenoma...  CROHN'S DISEASE (ICD-555.9) - he is [place]Chowdhury [place] 6-MP 556mtabs- 1.5 tabs/d... he notes intermittent diarrhea and was started on Questran Prn... ~  labs 3/11 noted sl incr LFTs, Protime, & Sed... ~  labs 10/11 showed norm chem & LFTs x incr bili, Plat=88K,  BNP=219, otherw norm.  Hx of ACUTE VASCULAR INSUFFICIENCY OF INTESTINE (ICD-557.0) - he has embolectomy of the superior mesenteric artery 1/03 by DrSheryn Bisonlong hospitalization w/ complications)... he has remained on COUMADIN in the coumadin clinic ever since then...  VENTRAL HERNIA (ICD-553.20) - he is s/p left inguinal hernia repair 6/08 by DrClarita Leberand had a prev incarcerated ventral hernia repaired 11/03... his residual/ recurrent ventral hernia has been evaluated by DrClarita Leberho feels that [place] to surg... currently he wears an abd binder when up and about...  HYPERTROPHY PROSTATE W/UR OBST & OTH LUTS (ICD-600.01) - saw DrNesi 7/10 w/ BPH, obstructive symptoms, and ED- given  Flomax & "shots"...   DEGENERATIVE JOINT DISEASE (ICD-715.90)  Hx of POLYMYALGIA RHEUMATICA (ICD-725) - hasn't req steroids x yrs... ~  labs 2/10 showed Sed= 8 ~  labs 10/10 showed Sed= 6 ~  labs 3/11 showed Sed= 51 w/ acute illness. ~  labs 10/11 showed Sed= 9  TREMOR (ICD-781.0) - [facility] 3[facility]- essential tremor w/ +FamHx in sister... trial Clonazepam 0.2535mid w/ improvement...  ANXIETY (ICD-300.00) - he uses the same Clonazepam 0.20m48mn...  Hx of SHINGLES (ICD-053.9) VITAMIN B12 DEFICIENCY (ICD-266.2) - on B 12 1000mc17mOTC... ~  labs 2/10 showed B12 level = 215... rec start oral B12 supplement daily. ~  labs 10/10 showed B12 level = 416... continue oral B12 supplement. ~  labs 10/11 showed B12= 592... continue same.  VITAMIN D DEFICIENCY (ICD-268.9) - on Vit D 1000 u daily OTC... ~  labs 2/10 showed Vit D level = 23... rec> start Vit D 1000 u daily..   Past Surgical History  Procedure Date  . Coronary artery bypass graft 1994  . Laparoscopic cholecystectomy 2002  . Elap w/ superior mesenteric artery embolectomy 1/03    by Dr. LawsoJennette Bankerepair of incarcerated ventral hernia and lysis of adhesions 11/03    by Dr. HingrBetsy PriesImplantation of biventric cardioverter-defibrillatorr     by Dr. TayloLovena Le/06 [employer] ContaEndicott  . Implantation of guidant cognis device     model [id number] inguinal hernia repair with mesh 6/08    by Dr. HingrBetsy Priesutpatient Encounter Prescriptions as of 05/19/2010  Medication Sig Dispense Refill  . carvedilol (COREG) 6.25 MG tablet 1 tablet twice a day       . cholecalciferol (VITAMIN D) 1000 UNITS tablet 1-2 tablets once a day       . folic acid (FOLVITE) 1 MG tablet Take 1 mg by mouth daily.        . furosemide (LASIX) 40 MG  tablet One tablet once a day       . lisinopril (PRINIVIL,ZESTRIL) 10 MG tablet Take 10 mg by mouth daily.        . mercaptopurine (PURINETHOL) 50 MG tablet Take 1.5 tabs by mouth once daily as directed by Dr. Su Ley       . simvastatin (ZOCOR) 20 MG tablet Take 20 mg by mouth at bedtime.        [name] spironolactone (ALDACTONE) 25 MG tablet One tablet once a day       . warfarin (COUMADIN) 5 MG tablet Take by mouth as directed.          Allergies not on file   Review of Systems         See HPI - all other systems neg except as noted... The patient complains of decreased hearing and dyspnea on exertion.  The patient denies anorexia, fever, weight loss, weight gain, vision loss, hoarseness, chest pain, syncope, peripheral edema, prolonged cough, headaches, hemoptysis, abdominal pain, melena, hematochezia, severe indigestion/heartburn, hematuria, incontinence, muscle weakness, suspicious skin lesions, transient blindness, difficulty walking, depression, unusual weight change, abnormal bleeding, enlarged lymph nodes, and angioedema.     Objective:   Physical Exam     WD, WN, [name] in NAD... GENERAL:  Alert & oriented; pleasant & cooperative... HEENT:  [place]/AT, EOM-wnl, PERRLA, EACs-clear, TMs-wnl, NOSE- sl red/ inflamm, THROAT-clear & wnl. NECK:  Supple w/ fairROM; no JVD; normal carotid impulses w/o bruits; no thyromegaly or  nodules palpated; no lymphadenopathy. CHEST:  Decr BS w/ few scat rhonchi, no wheezing, no rales, no signs of consolidation. HEART:  Regular Rhythm; gr 1/6 SEM without rubs or gallops detected... ABDOMEN:  Soft & nontender; mult scars, & ventral hernia; normal bowel sounds; no organomegaly or masses palpated. EXT: without deformities, mod arthritic changes; no varicose veins/ venous insuffic/ or edema. NEURO:  CN's intact; I do not apprec a tremor at present; no focal neuro deficits... DERM:  No lesions noted; no rash etc...   Assessment & Plan:   HBP>  BP controlled, continue same meds, and try to incr activity...  CAD/ Cardiomyopathy>  Followed by [name] & DrKlein, their notes are reviewed...  Hyperlipid>  On Simva20 + diet> continue same...  GI>  Followed [place] now by [facility] as DrChowdhury has moved to [facility]; continue meds & close f/u w/ GI...  Hx vasc insuffic intestines>  SMA embolectomy 2003, he remains on Coumadin via the CC...  Other medical problems as noted.Marland KitchenMarland Kitchen

## 2010-05-19 NOTE — Patient Instructions (Signed)
Today we updated your med list in our EPIC system...    Please try the Questran from DrChowdhury- one packet daily to decrease the diarrhea...    We wrote a new prescription for PREDNISONE to try in a tapering schedule over the next month:       Start w/ 1 tab twice daily for 5d, then 1 tab daily for 5d, then 1/2 tab daily for 5d, then 1/2 tab every other day til gone...       (Please keep a log of your symptoms & how you respond to the Pred Rx- call me in 26monthto let me know)...    We also wrote for a hemorrhoid cream> apply after your bowel movements as we discussed...  Call for any questions... Let's plan a routine follow up in 6 months, but we will decide what to do about the Prednisone after we assess your response to this course of Rx.

## 2010-05-26 ENCOUNTER — Telehealth: Payer: Self-pay | Admitting: Cardiology

## 2010-05-26 NOTE — Telephone Encounter (Signed)
Pt having dental work, need to come off his blood thinner. appt on 5/15. Fax # [phone].

## 2010-05-26 NOTE — Telephone Encounter (Signed)
Spoke w/pt's wife, pt's crown came off and they need to do a root extraction and would like pt to come off his coumadin, will send to Dr [name] for review

## 2010-05-28 NOTE — Telephone Encounter (Signed)
Okay to be off Coumadin for 5 days before the procedure.  Does not need to be bridged.  Must restart Coumadin the day of the procedure and arrange early followup with Coumadin clinic

## 2010-05-28 NOTE — Telephone Encounter (Signed)
Pt's wife is aware, note faxed to dentist

## 2010-06-03 ENCOUNTER — Ambulatory Visit (INDEPENDENT_AMBULATORY_CARE_PROVIDER_SITE_OTHER): Payer: Medicare Other | Admitting: *Deleted

## 2010-06-03 DIAGNOSIS — K55059 Acute (reversible) ischemia of intestine, part and extent unspecified: Secondary | ICD-10-CM

## 2010-06-03 DIAGNOSIS — K55069 Acute infarction of intestine, part and extent unspecified: Secondary | ICD-10-CM

## 2010-06-06 ENCOUNTER — Encounter: Payer: Self-pay | Admitting: Pulmonary Disease

## 2010-06-15 NOTE — Assessment & Plan Note (Signed)
[facility]                         ELECTROPHYSIOLOGY OFFICE NOTE   NAME:Fullen, [name]                          MRN:          [id number]  DATE:08/29/2006                            DOB:          [birth year]    Mr. Kaeser is seen as part of the MADIT CRT protocol.  He was initially  implanted by Dr. Lovena Le.  I am seeing him in Dr. Tanna Furry stead.   He is currently no longer working.  He has no major complaints.   His blood pressure is 126/84, his pulse is 68.  LUNGS:  Clear.  HEART SOUNDS:  Regular.   Interrogation of his device demonstrated normal function.   He will be seen as part of protocol followup.     Deboraha Sprang, MD, [facility]  Electronically Signed    SCK/MedQ  DD: 08/29/2006  DT: 08/30/2006  Job #: [id number]

## 2010-06-15 NOTE — Assessment & Plan Note (Signed)
[facility]                         ELECTROPHYSIOLOGY OFFICE NOTE   NAME:[name], [name]                          MRN:          [id number]  DATE:03/16/2007                            DOB:          1931-03-12    [name] is seen as part of CRT followup.  His device is functioning  normally.  He was randomized to CRT.  He is having some problems with  epigastric discomfort.  He has a history of Crohn's disease, but he  thinks this is different.  It has been fairly constant for the last 2-3  days associated with some viral symptoms.   MEDICATIONS:  His Coreg was down adjusted.  He is to see Dr. [name] in  about 6 more weeks.   PHYSICAL EXAMINATION:  VITAL SIGNS:  Blood pressure is 104/67, with a  pulse of 60.  LUNGS:  Clear.  HEART:  Heart sounds were regular.  EXTREMITIES:  Without edema.   Interrogation of his Guidant ICD demonstrates a P wave of 6.9 with an  impedance of 553, a threshold 0.8 at 0.5.  The R wave was 25 with an  impedance of 744, threshold 0.6, and the LV was 10.4, 447 impedance, and  a threshold of 0.8.  Battery voltage 2.75.   IMPRESSION:  1. Ischemic cardiomyopathy with prior bypass and depressed left      ventricular function.  2. Status post CRT as part of the MADIT-CRT protocol.  3. History of Crohn's disease.  4. Sluggishness, question related to medications.  5. Abdominal discomfort.   [name] is stable from an arrhythmia point of view.  I have asked that  he be in close contact with his gastroenterologist in the event that his  symptoms do not abate.     Deboraha Sprang, MD, [facility]  Electronically Signed    SCK/MedQ  DD: 03/16/2007  DT: 03/18/2007  Job #: [id number]

## 2010-06-15 NOTE — Assessment & Plan Note (Signed)
[facility]                         ELECTROPHYSIOLOGY OFFICE NOTE   NAME:Petsch, [name]                          MRN:          [id number]  DATE:07/10/2006                            DOB:          Jan 15, 1932    Mr. Morss is here today for an unscheduled visit, being concerned that  he recently fell and hit his ICD and has a scab over his skin over this  region and is concerned about damage.  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  He does receive  Coumadin chronically and has done so since his episode of mesenteric  ischemia with thromboembolism.  The patient denies chest pain or  shortness of breath today.  He denies peripheral edema.   PHYSICAL EXAMINATION:  GENERAL APPEARANCE:  He is a pleasant middle-aged  man in no acute distress.  VITAL SIGNS:  Blood pressure was 115/75, pulse 70 and regular,  respirations 18.  NECK:  No jugular venous distension.  LUNGS:  Clear to auscultation bilaterally. No wheezing, rhonchi or  rales.  CARDIOVASCULAR:  Regular rate and rhythm with normal S1 and S2.  His ICD  insertion site had a small area of excoriation but there was no deep  incisions or cuts.  There is no evidence of any infection.   Interrogation of his defibrillator demonstrates a Guidant BiV device  with P-waves of 6 and R-waves of 22.  The impedance was 547 in the  atrium, 692 in the ventricle, 380 in the right ventricle.  Threshold 0.8  at 0.5 in both the right atrium and right ventricle and 1.4 at 0.8 in  the left ventricle.  The battery voltage was 2.98 volts now.   IMPRESSION:  1. Nonischemic cardiomyopathy.  2. Congestive heart failure.  3. Left bundle branch block.  4. Status post biventricular implantable cardioverter defibrillator      insertion with recent fall worrisome for problems with implantable      cardioverter  defibrillator.   DISCUSSION:  Overall, the patient is stable.  His ICD did not appear to  be damaged despite being struck on a chair after a recent fall.  Will  plan to see him back as previously scheduled.     Champ Mungo. Lovena Le, MD  Electronically Signed    GWT/MedQ  DD: 07/10/2006  DT: 07/10/2006  Job #: [id number]

## 2010-06-15 NOTE — Assessment & Plan Note (Signed)
[facility]                            CARDIOLOGY OFFICE NOTE   NAME:Eaves, [name]                          MRN:          [id number]  DATE:09/04/2006                            DOB:          Feb 12, 1931    Mr. Pohle is seen for cardiology followup. I saw him last in February  2008. Since that time, he has had a hernia repair by Dr. Fanny Skates.  He also has seen Dr. Caryl Comes once in followup. The hernia repair went  well. The patient has a substantial ventral hernia and Dr. [name] will  help refer him for treatment of this if he decides he wants to proceed.  He has not had any chest pain. He is going about reasonable activities.  He has a mild tremor. Overall he is doing well.   PAST MEDICAL HISTORY:   ALLERGIES:  No known drug allergies.   MEDICATIONS:  Zocor, Coumadin, 6-MP, Lasix, Kay Ciel, carvedilol, folic  acid, aspirin and lisinopril.   OTHER MEDICAL PROBLEMS:  See the list on my note of March 20, 2006.   REVIEW OF SYSTEMS:  He really does well. He has some slight dizziness  when standing after bending.   PHYSICAL EXAMINATION:  VITAL SIGNS:  Weight is 196, blood pressure  112/72 with a pulse of 53.  GENERAL:  The patient is oriented to person, time and place. Affect is  normal. He has a very slow speech pattern that is old. He does have a  mild tremor now.  LUNGS:  Clear. Respiratory effort is not labored.  CARDIAC:  Reveals an S1 and S2. There are no clicks or significant  murmurs.  ABDOMEN:  Wrapped with his support for his hernia. He has no significant  peripheral edema.   EKG is paced.   Problems are listed on the note of March 13, 2006. His overall  cardiac status is stable.  1. Hernia repaired.  2. Significant ventral hernia which will continue to be followed with      Dr. [name].   He is doing well, no changes in his therapy. I will see him back in 6  months.     Carlena Bjornstad, MD, [facility]  Electronically  Signed   JDK/MedQ  DD: 09/04/2006  DT: 09/04/2006  Job #: [id number]   cc:   [name]. [name], M.D.

## 2010-06-15 NOTE — Assessment & Plan Note (Signed)
[facility]                         ELECTROPHYSIOLOGY OFFICE NOTE   NAME:Dippolito, KARTHIKEYA FUNKE                       MRN:          [id number]  DATE:02/28/2008                            DOB:          Jun 10, 1931    Mr. Rost was seen in followup for ICD implanted as part of  MADIT CRT  for nonischemic cardiomyopathy.  He has had no intercurrent ICD events.  He has had problems with weakness.  This has occurred in the context of  having had a cold-like illness, he is getting better.  His GI symptoms  are also relatively stable.  He saw Dr. [name] a couple of months ago.   MEDICATIONS:  1. Zocor 20.  2. Coumadin.  3. Lasix 40.  4. Potassium 20.  5. Aspirin 81.  6. Coreg 12.5 b.i.d.  7. Lisinopril 20.  8. 6-mercaptopurine.   PHYSICAL EXAMINATION:  VITAL SIGNS:  His blood pressure was 122/72, his  pulse was 68, his weight was 193 which is stable.  LUNGS:  Clear.  HEART:  Sounds were regular.  ABDOMEN:  Soft.  Extremities:  No edema.   Interrogation of his Guidant contact ICD demonstrates a P-wave of 7.4,  impedance of 5.5.  Threshold of 0.6 at 0.5.  The R-wave was 43.1, the  impedance of 755 with threshold of 0.8 at 0.5, which was also the LV  threshold with an amplitude of 31, impedence of 447.  Heart rate  histograms demonstrated adequate heart rate response.   IMPRESSION:  1. Ischemic cardiomyopathy status post CABG and depressed left      ventricular function.  2. Left bundle-branch block.  3. Status post CRT - D as part of MADIT CRT.  4. Bradycardia with adequate heart rate excursion.   Mr. Erber is doing pretty well from heart rhythm point-of-view.  We will  plan to see him again for research followup.   As relates to his bradycardia, I was pleased to see that his heart rate  excursion was adequate.  I do not think bradycardia  explains his  weakness and fatigue.  This apparently is episodic and overall getting  better.  He is to follow up  with Dr. Lenna Gilford about this.     Deboraha Sprang, MD, [facility]  Electronically Signed    SCK/MedQ  DD: 02/28/2008  DT: 02/29/2008  Job #: [id number]

## 2010-06-15 NOTE — Assessment & Plan Note (Signed)
[facility]                            CARDIOLOGY OFFICE NOTE   NAME:Nipper, [name]                          MRN:          [id number]  DATE:03/09/2007                            DOB:          1931/05/18    Mr. Vanaman is doing well.  When I saw him last in August of 2008, he was  stable.  He had a inguinal hernia repaired successfully by Dr. Fanny Skates.  He has significant problems with a ventral hernia, and Dr.  [name] feels that, if he wants anything done about this, that he should  be referred to a physician in [place] who specializes in this  particular area.  Mr. Pardee does not enjoy wearing his support belt, but  he is managing.  He feels somewhat washed out.  He is not in any chest  pain.  He has not had any syncope or pre-syncope.   PAST MEDICAL HISTORY:  ALLERGIES:  NO KNOWN DRUG ALLERGIES.   MEDICATIONS:  Zocor, Coumadin, 6MP, Lasix, KCL, Coreg 25 b.i.d. (to be  lowered to 12.5 b.i.d. for now), folic acid, aspirin, lisinopril,  Gabapentin, and cholestyramine.   OTHER MEDICAL PROBLEMS:  See the complete list below.   REVIEW OF SYSTEMS:  The review of systems is negative, other than the  HPI.   PHYSICAL EXAM:  VITAL SIGNS:  Weight is 200.  This is up a few pounds  since the last visit.  Blood pressure is 115/67 with a pulse of 62.  GENERAL:  The patient is oriented to person, time and place.  Affect is  normal.  HEENT:  Reveals no xanthelasma.  He has normal extraocular motion.  There are no carotid bruits.  There is no jugular venous distention.  LUNGS:  Clear.  Respiratory effort is not labored.  CARDIAC:  Exam reveals an S1 and S2.  There are no clicks or significant  murmurs.  He is wearing his abdominal support pad.  He has no  significant peripheral edema.   EKG is paced.   PROBLEMS:  Include:  1. Coronary disease.  Post CABG in 1994, with cath in 96 revealing a      total right and an 80% OM.  Vein graft to the diagonal  was      occluded, other bypasses were patent.  There was trace MR.  There      was no intervention done.  2. Hypertension, treated.  3. Hyperlipidemia, treated.  4. Status post cholecystectomy.  5. GERD.  6. History of mesenteric embolus, with surgery in 2003.  7. Long-term Coumadin because of his history of embolus.  8. Crohn's disease with significant surgery in [facility].  9. Some mild congestive heart failure related LV dysfunction that has      stabilized.  10.Status post biventricular implantable cardio-defibrillator.  This      is followed by Dr. Caryl Comes.  11.Left bundle branch block.  12.Ventral hernia which still affects him.  13.Overall fatigue.  Etiology is not clear to me.  It does not appear      to  be heart failure at this time.  We will by empirically lowering      his Coreg dose.  He says he felt very poorly in the past, when he      was on other beta blockers.  I will see him back to see if this      affects his overall status.  We will then think about his pacing      rate and other issues.     Carlena Bjornstad, MD, [facility]  Electronically Signed    JDK/MedQ  DD: 03/09/2007  DT: 03/10/2007  Job #: [id number]

## 2010-06-15 NOTE — Assessment & Plan Note (Signed)
[facility]                            CARDIOLOGY OFFICE NOTE   NAME:[name] [name] STEGALL                       MRN:          [id number]  DATE:12/21/2007                            DOB:          Nov 07, 1931    [name] [name] is here for cardiology followup.  Overall, he is actually  relatively stable.  We repeated his 2-D echo since his last visit.  His  ejection fraction remains in the 35% range.  I had lowered his  carvedilol dose to see if this helped him feel better.  He does not feel  any different and in particular his erectile dysfunction is not changed.  I do not think I can help any further in this regard.  We need to raise  his carvedilol dose back up to 12.5 b.i.d.   PAST MEDICAL HISTORY:   ALLERGIES:  No known drug allergies.   MEDICATIONS:  See the flow sheet.   REVIEW OF SYSTEMS:  He is not having any GU symptoms.  He has no muscle  aches and pains.  He has no fevers or chills or headaches.  Otherwise,  his review of systems is negative.   PHYSICAL EXAMINATION:  VITAL SIGNS:  Blood pressure 122/68 with a pulse  of 70.  The patient is oriented to person, time, and place.  He is here  with his wife.  HEENT:  No xanthelasma.  He has normal extraocular motion.  NECK:  There are no carotid bruits.  There is no jugular venous  distention.  LUNGS:  Clear.  Respiratory effort is not labored.  CARDIAC:  An S1 with an S2.  There are no clicks or significant murmurs.  ABDOMEN:  He wears the external abdominal support for his hernia.  EXTREMITIES:  He has no peripheral edema.   No labs were done today.  As outlined his echo was done on December 20, 2007.  He has ejection fraction of 35%.  There is aortic valve  thickening, but no aortic stenosis.  There is mild right ventricular  dysfunction.  He has a pacing catheter in his right heart.   Problems are listed on the note of March 09, 2007.  His cardiac status  is stable.  His carvedilol dose will  be reincreased back up to 12.5  b.i.d.  I will see him in 6 months for cardiology followup.     Carlena Bjornstad, MD, [facility]  Electronically Signed    JDK/MedQ  DD: 12/21/2007  DT: 12/21/2007  Job #: [id number]

## 2010-06-15 NOTE — Op Note (Signed)
[name], [name]                   ACCOUNT NO.:  [id number]   MEDICAL RECORD NO.:  [id number]          PATIENT TYPE:  AMB   LOCATION:  SDS                          FACILITY:  [facility]   PHYSICIAN:  [name]. [name], M.D.DATE OF BIRTH:  [birth year]   DATE OF PROCEDURE:  07/17/2006  DATE OF DISCHARGE:                               OPERATIVE REPORT   PREOPERATIVE DIAGNOSIS:  Left inguinal hernia.   POSTOPERATIVE DIAGNOSIS:  Left inguinal hernia.   OPERATION PERFORMED:  Repair left inguinal hernia with mesh  Karl Pock repair).   SURGEON:  [name]. [name], M.D.   OPERATIVE INDICATIONS:  This is a 77 year old white male with complex  cardiovascular problems and Crohn disease.  He has moderately large  ventral hernia defect that is asymptomatic.  He has a pacemaker and  internal defibrillator and he is on Coumadin.  He recently noticed a  bulge in his left groin and it is painful. He sometimes has to forcibly  reduce this.  On exam he has a large ventral hernia at least 20 cm in  transverse dimension.  This is nontender.  He has a left inguinal hernia  that is small to moderate size, reducible. No evidence of hernia on the  right.  It is felt that he is at risk for incarceration of his left  inguinal hernia.  He is brought to operating room electively.   OPERATIVE TECHNIQUE:  Following induction of general endotracheal  anesthesia, the patient's left groin was prepped and draped in sterile  fashion.  Intravenous antibiotics were given prior to the incision.  The  patient was identified as correct patient, correct procedure.  0.5%  Marcaine with epinephrine was used as local infiltration anesthetic.  The oblique incision was made generally transverse overlying the  inguinal canal on the left side.  Dissection was carried down through  subcutaneous tissue exposing the aponeurosis of the external oblique.  The external oblique was incised in the direction of its fibers, opening  up  the external inguinal ring.  The external oblique was dissected away  from the underlying tissues and self-retaining retractors were placed.  The sensory nerve was identified which was involved with the cord  structures, was traced laterally to its emergence from the muscle,  clamped, divided and ligated with 2-0 silk ties.  The cord structures  were mobilized and encircled with a Penrose drain.  He had two large  lipomas which were skeletonized and dissected back to the level of the  internal ring, clamped, divided and ligated with 2-0 silk ties.  He had  an indirect hernia sac which was dissected away from the cord structures  and was opened.  This was found not to have any incarcerated tissue but  it was a partially sliding hernia with colon making it part of the wall.  I simply closed this with a pursestring suture of 2-0 silk and reduced  it.  The floor of the inguinal canal was then repaired and reinforced  with an onlay graft of polypropylene mesh.  A 3 inches x 6 inches piece  of mesh was used and was trimmed at the corners to accommodate the  wound.  The mesh was sutured in place with running sutures and  interrupted mattress sutures of 2-0 Prolene.  The mesh was sutured so as  to generously overlap the fascia at the pubic tubercle, then along the  inguinal ligament inferiorly.  Medially and superior medially several  interrupted mattress sutures of 2-0 Prolene were placed to secure the  mesh and superiorly and superolaterally a running suture of 2-0 Prolene  was used.  The mesh was incised laterally so as to wrap around the cord  structures at the internal ring.  The tails of the mesh were overlapped  laterally and suture lines completed.  This provided very secure repair  both medial and lateral to the cord structures but allowed adequate  opening for the cord.  Hemostasis was excellent.  Wound was irrigated  with saline.  The external oblique was closed with running suture of  2-0  Vicryl placing the cord structures deep to the external oblique.  Scarpa's fascia was closed with 3-0  Vicryl sutures and the skin closed with running subcuticular suture of 4-  0 Monocryl and Steri-Strips.  Clean bandages were placed and the patient  taken to recovery room in stable condition.  Estimated blood loss was  about 10 mL.  Complications none.  Sponge, needle, and instrument counts  were correct.      [name]. [name], M.D.  Electronically Signed     HMI/MEDQ  D:  07/17/2006  T:  07/17/2006  Job:  [id number]   cc:   Carlena Bjornstad, MD, [facility]

## 2010-06-15 NOTE — Assessment & Plan Note (Signed)
[facility]                            CARDIOLOGY OFFICE NOTE   NAME:[name], [name]                          MRN:          [id number]  DATE:05/10/2007                            DOB:          [birth year]    [name] is actually doing well.  When I saw him on March 09, 2007,  we decided to lower his carvedilol dose.  Since then he is feeling  better.  Overall he is doing well.  He is not having any chest pain.   PAST MEDICAL HISTORY:   ALLERGIES:  No known drug allergies.   MEDICATIONS:  1. Zocor 20.  2. Coumadin.  3. Lasix.  4. Kay Ciel.  5. Folic acid.  6. Aspirin.  7. Gabapentin.  8. Coreg 12.5 b.i.d.  9. Lisinopril 20.   OTHER MEDICAL PROBLEMS:  See the list on my note of March 09, 2007.   REVIEW OF SYSTEMS:  He is actually doing well.  He appears to have  decreased rate of breathing when he lays on his back and his wife  notices this.  However, most of the time he is not on his back.  We  discussed with her a sleep study would be appropriate.  At this point,  this can be watched as he does not appear to have obstructive symptoms  on a regular basis.  Otherwise his review of systems is negative.   PHYSICAL EXAM:  Blood pressure is 126/84 with a pulse of 65.  The patient is oriented to person, time, place.  Affect is normal.  HEENT:  Reveals no xanthelasma.  He has normal extraocular motion.  There are no carotid bruits.  There is no jugular venous distention.  LUNGS:  Clear.  Respiratory effort is not labored.  CARDIAC:  Exam reveals an S1 with an S2.  There are no clicks or  significant murmurs.  He is wearing his brace for his abdominal hernia.  He has no peripheral edema.   No labs were done today.   [name] is stable.  Problems are listed on March 09, 2007.  No other  changes in his meds.  He does need a fasting lipid profile.   NEW PROBLEM:  #14.  Question of sleep apnea although he has difficulty  only when lying on  his back and this is not frequent.  No further  testing to be done at this time.     Carlena Bjornstad, MD, [facility]  Electronically Signed    JDK/MedQ  DD: 05/10/2007  DT: 05/10/2007  Job #: [id number]   cc:   [name]. [name], M.D.

## 2010-06-15 NOTE — Assessment & Plan Note (Signed)
[facility] OFFICE NOTE   NAME:Kozel, DAYMIEN GOTH                       MRN:          [id number]  DATE:11/12/2007                            DOB:          1932/01/02    HISTORY OF PRESENT ILLNESS:  Mr. Carpenter is here for followup of multiple  cardiac problems.  He has known coronary artery disease post CABG in the  past.  His last cath was done in June 2006.  There was no major change  in his coronaries.  His LV dysfunction at that time was a major issue.  He is not having any significant chest pain.  He also has a history of  hypertension.  This is not causing any difficulties.  He is on  medications and it is nicely controlled.  The patient is on long-term  Coumadin use for history of mesenteric embolus.  Recently, his Coumadin  dosing has been higher and he is concerned about this.  He will follow  up with Alinda Deem next week when he is in the [facility] about  whether his medicine should be changed back from generic to the regular  medication.  The patient is also followed for severe left ventricular  dysfunction.  He has a biventricular ICD in place.  This is functioning  well.  His last echo was in 2007, and it is now time for a followup  echo.  The patient also mentions that he is having problems with  erectile dysfunction.  He thinks that his beta-blocker may be playing a  role with this.  We will give him a trial of the lower dose.   PAST MEDICAL HISTORY:   ALLERGIES:  No known drug allergies.   MEDICATIONS:  1. Zocor.  2. Coumadin.  3. Lasix.  4. KCl.  5. Folic acid.  6. Aspirin.  7. Gabapentin.  8. Carvedilol 12.5 b.i.d. (to be changed 6.25 b.i.d.).  9. Lisinopril.  10.6-MP.   OTHER MEDICAL PROBLEMS:  See the list on my note of March 09, 2007.   REVIEW OF SYSTEMS:  He is not having any fever or chills.  He has no  skin rashes.  The remainder of his review of systems is negative.   PHYSICAL EXAMINATION:  VITAL SIGNS:  Blood pressure is 118/68 with a  pulse of 56.  GENERAL:  The patient is oriented to person, time, and place.  Affect is  normal.  HEENT:  Reveals no xanthelasma.  He has normal extraocular motion.  There are no carotid bruits.  There is no jugular venous distention.  LUNGS:  Clear.  Respiratory effort is not labored.  CARDIAC:  Reveals an S1 with an S2.  There are no clicks or significant  murmurs.  ABDOMEN:  Soft.  EXTREMITIES:  He has no peripheral edema.   EKG is done and reviewed.  His rhythm is paced.   PROBLEMS:  1. Coronary artery disease.  His overall coronary artery status is      stable and we will not change his medicines.  2. Hypertension.  This is well  treated with no change in medicine.  3. Coumadin therapy.  He will follow up with Alinda Deem about his      Coumadin medicine.  4. Left ventricular dysfunction.  He is on appropriate medicines.  We      will lower his beta-blocker dose to see how he feels.  In the      meantime, he will have a followup 2-D echo to reassess left      ventricular function to help with his ongoing care.  5. New problem, erectile dysfunction.  I will not be involved in a      major way in the workup, but we will see if a lower dose of beta-      blocker helps him overall.  We will arrange the studies and I will      see him in followup.     Carlena Bjornstad, MD, [facility]  Electronically Signed    JDK/MedQ  DD: 11/12/2007  DT: 11/13/2007  Job #: [id number]

## 2010-06-18 NOTE — Discharge Summary (Signed)
NAMEMarland Kitchen  [name], [name]                             ACCOUNT NO.:  [id number]   MEDICAL RECORD NO.:  [id number]                   PATIENT TYPE:  INP   LOCATION:  0446                                 FACILITY:  [facility]:  [name]. [name], M.D.             DATE OF BIRTH:  04-Oct-1931   DATE OF ADMISSION:  12/10/2001  DATE OF DISCHARGE:  12/13/2001                                 DISCHARGE SUMMARY   FINAL DIAGNOSES:  1. Ventral incisional hernia.  2. 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  3. Polymyalgia.  4. Coronary artery disease.  5. Hypertension.   OPERATIONS PERFORMED:  Laparoscopic repair of ventral hernia with Gore-Tex  mesh.  Date of surgery, December 10, 2001.   HISTORY OF PRESENT ILLNESS:  This is a 77 year old white man who is admitted  electively for a ventral hernia repair.  He had undergone exploratory  laparotomy and superior mesenteric artery embolectomy in January 2003.  He  had a second-look laparotomy 24 hours later, and his bowel was viable.  He  was placed on Coumadin at that time.  He also has coronary artery disease  with a coronary artery bypass graft in 1992, and he has done well.  His  ventral hernia is fairly complex in the upper midline, and it causes him  some pain and has been getting larger.  He is admitted electively.   PHYSICAL EXAMINATION:  GENERAL:  The patient is fit for his age.  NECK:  Reveals no mass.  No bruit.  LUNGS:  Clear.  HEART:  Regular rate and rhythm.  No murmur.  Well-healed sternotomy scar.  ABDOMEN:  Soft and nontender.  Complex ventral hernia in the upper midline  with one large __________ centrally in the midline and then a smaller  weakness above that.  This has been reducible.   HOSPITAL COURSE:  On the day of admission the patient was taken to the  operating room and underwent a laparoscopy and laparoscopic repair of his  ventral hernia.  He had  extension of adhesions which took a great deal of  time to take down.  We did repair the hernia with a very large piece of Gore-  Tex mesh, and the surgery was uneventful.  He was kept off of his  anticoagulation for about 48 hours, and then we restarted his Coumadin.   Postoperatively he did well.  Moderate pain the first 24 hours but improved  thereafter.  Began diet on the first postoperative day and then progressed  thereafter without much problems.  He had a low serum potassium which was  treated.  He was ready to be discharged on December 13, 2001.  At that time  he was doing well, wanting to go home.  He was tolerating diet, passing  flatus.  His abdomen was soft.  The wounds looked good.  He had bowel  sounds, and everything looked fine.   He was told to go back on his Coumadin dose as scheduled.  I did give him 10  mg of Coumadin on December 12, 2001, and put him on a heparin drip at that  time, and then when he was ready to go home we simply told him to go back on  his usual dose of Coumadin and to follow up in the [facility]  in one week.  He was to follow up with me in the office in one week as well.    DISCHARGE MEDICATIONS:  1. Vicodin for pain.  2. His usual medicines including:     A. Zocor 20 mg a day.     B. Prednisone 5 mg a day.     C. Toprol 25 mg a day.     D. Altace 5 mg a day.     E. Coumadin.                                               [name]. [name], M.D.    HMI/MEDQ  D:  01/03/2002  T:  01/03/2002  Job:  [id number]   cc:   Deborra Medina. Lenna Gilford, M.D. [facility]   Dola Argyle, M.D. [facility]   Anson Oregon, M.D.  [address] [place]  [place] [address]  Fax: [phone]

## 2010-06-18 NOTE — Discharge Summary (Signed)
[name], [name] NO.:  [id number]   MEDICAL RECORD NO.:  [id number]          PATIENT TYPE:  OIB   LOCATION:  9179                         FACILITY:  [facility]   PHYSICIAN:  Dola Argyle, M.D.     DATE OF BIRTH:  [birth year]   DATE OF ADMISSION:  10/11/2004  DATE OF DISCHARGE:  10/12/2004                                 DISCHARGE SUMMARY   ALLERGIES:  No known drug allergies.   DISCHARGE DIAGNOSES:  1.  Discharging day 1, status post revision of left ventricular lead after      finding of left ventricular lead dislodgement, first into the coronary      sinus, then into the right ventricle.  2.  Status post Bi-V ICD MADIT-CRT study.  3.  Ischemic cardiomyopathy with ejection fraction of 15%.  4.  History of myocardial infarction, 3 vessel coronary artery disease      status post coronary artery bypass grafting surgery.  5.  Class II congestive heart failure, on medications (prior class III      congestive heart failure).   SECONDARY DIAGNOSES:  1.  History of Crohn's disease status post exploratory laparotomy.  2.  History of mesenteric embolus with repair, on chronic Coumadin for this.      Abdominal wound healing by secondary intention.  3.  Gastroesophageal reflux disease.  4.  Hypertension.  5.  Dyslipidemia.   PROCEDURE:  October 11, 2004 - V lead revision without immediate  complications, Dr. Cristopher Peru.   DISPOSITION:  [name] is ready for discharge on day 1, after revising  left ventricular lead, which had dislodged. A new lead has been implanted.  (The company had provided a lead, unfortunately I cannot find that). The  patient has had no post-procedural complications and is ready for discharge  on post-procedure day 1. Blood pressure 116/65. Breathing without  complication. He has had no dysrhythmias. No respiratory distress. His  incision shows no hematoma. The patient discharges with the following  advisements.   DIET:  Low-sodium,  low-cholesterol diet.   ACTIVITY:  He is asked NOT to drive for the next week, NOT to lift any heavy  weights for the next 2 weeks.   SPECIAL INSTRUCTIONS:  He is asked to keep his incision dry for the next 7  days and to sponge bathe until Monday, October 18, 2004.   PAIN CONTROL:  Tylenol 325 mg 1 to 2 tabs every 4 to 6 hours.   DISCHARGE MEDICATIONS:  1.  Coumadin 7.5 mg Tuesday, Thursday, Saturday, and Sunder with 5 mg on      Monday, Wednesday, and Friday.  2.  Zocor 20 mg daily at bedtime.  3.  Lasix 40 mg daily.  4.  Potassium chloride 20 meq daily.  5.  Mavik 2 mg daily.  6.  Coreg 6.25 mg twice daily.  7.  Mercaptopurine 50 mg daily.  8.  Folic acid 1 mg daily.   FOLLOW UP:  1.  [place] at [address].  2.  ICD Clinic on Wednesday,  October 27, 2004 at 9:30 a.m.  3.  Dr. Lovena Le on January 18, 2005 at 10:50 in the morning.   HISTORY OF PRESENT ILLNESS:  [name] is a 77 year old male. He has a long  standing history of coronary artery disease. He is status post coronary  artery bypass grafting with ejection fraction of 15%. He had implantation of  a Bi-V ICD per the MADIT-CRT. It was found that the left ventricular lead  originally placed had become dislodged. The patient has been described the  risks and benefits of a re-entry procedure and this will be done on an  elective basis. Of note, the patient's class III congestive heart failure  symptoms have improved to class II with maximal medication therapy.   HOSPITAL COURSE:  The patient presented October 11, 2004. He underwent  left ventricular lead revision with implantation of a new spiral Guidant  lead in the coronary sinus, lateral branching vein, successfully implanted  by Dr. Lovena Le. Chest x-ray on post-procedure day 1 shows appropriate  position. The patient discharging post-procedure day 1 without  complications. He is to continue his regular medications. Restart Coumadin  today,  October 12, 2004.      Sueanne Margarita, P.A.    ______________________________  Dola Argyle, M.D.    GM/MEDQ  D:  10/12/2004  T:  10/12/2004  Job:  [id number]   cc:   Deborra Medina. Lenna Gilford, M.D. [facility]  [address]  [place] [address]

## 2010-06-18 NOTE — Cardiovascular Report (Signed)
[name], [name]                   ACCOUNT NO.:  [id number]   MEDICAL RECORD NO.:  [id number]          PATIENT TYPE:  OIB   LOCATION:  6501                         FACILITY:  [facility]   PHYSICIAN:  Jenkins Rouge, M.D.     DATE OF BIRTH:  [birth year]   DATE OF PROCEDURE:  07/19/2004  DATE OF DISCHARGE:                              CARDIAC CATHETERIZATION   INDICATIONS:  Ischemic cardiomyopathy, increasing shortness of breath and  heart failure.   Standard catheterization was done from the right femoral artery and vein.  A  5 French arterial sheath was used, and a 7 [place] venous sheath was used.   RIGHT HEART CATHETERIZATION:  Right heart catheterization showed a mean  right atrial pressure of 6, RV pressure was 43/8, PA pressure was 44/20,  mean pulmonary capillary wedge pressure was 27.   LV pressure was 110/22, aortic pressure was 116/90.  Fick cardiac output was  3.7 liters/minute, with an index decreased at 1.7 liters/minute/sq m.   CORONARY ARTERIOGRAPHY:  Left main coronary artery had a 30% tubular  stenosis.   Left anterior descending artery was 100% occluded proximally.   Circumflex coronary artery had an 80% first obtuse marginal branch lesion.  It was focal in the midvessel.   The right coronary artery was 100% occluded proximally.   Saphenous vein graft to the diagonal branch was 100% occluded.   The saphenous vein graft to the PD/PLA had 30-40% mid and distal stenosis.  There were no critical lesions.   The left internal mammary artery to the LAD was widely patent.   RAO VENTRICULOGRAPHY:  RAO ventriculography showed global hypokinesis, with  an EF in the 15% range.  There was trace MR.  There was no evidence of mural  thrombus.   IMPRESSION:  I discussed the case with Dr. [name].  The patient is not having  chest pain.  Clearly, his problem is congestive heart failure and decreased  LV function.  We will start him on Mavik 10 mg daily.  He will have his  Coumadin  level and BMET checked later this week.  He will be referred for  possibility of biventricular AICD.   Although the OM is not bypassed and has a 70-80% lesion in it, I think that  the patient's primary symptoms have been congestive failure, and clearly he  has decreased LV function and would be a good candidate for biventricular  AICD.   Since the patient is restarting his Coumadin tomorrow, we will have him  follow up in the [facility] on Friday.       PN/MEDQ  D:  07/19/2004  T:  07/19/2004  Job:  [id number]   cc:   Dola Argyle, M.D.

## 2010-06-18 NOTE — Discharge Summary (Signed)
[place]. [facility]  Patient:    [name], [name] Visit Number: [id number] MRN: [id number]          Service Type: SUR Location: 3086 [id number] Attending Physician:  Barbera Setters Dictated by:   [name]. [name], M.D. Admit Date:  02/05/2001 Discharge Date: 02/15/2001   CC:         Nicki Reaper M. Lenna Gilford, M.D. [facility]  Jeneen Rinks D. Kellie Simmering, M.D.  Carlena Bjornstad, M.D. [facility]   Discharge Summary  FINAL DIAGNOSES: 1. 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 2. Coronary artery disease, status post coronary artery bypass grafting    in 1994. 3. Hypertension. 4. Status post laparoscopic cholecystectomy February 2002. 5. Hypercholesterolemia.  PROCEDURES: 1. Mesenteric angiography, February 05, 2001. 2. Exploratory laparotomy, superior mesenteric artery thrombectomy,    February 05, 2001. 3. Second look laparotomy, February 07, 2001. 4. Transesophageal echocardiogram, February 07, 2001.  HISTORY;  This is a 77 year old white man with the above medical history.  he presented to Dr. Lenna Gilford with a six-day history of nausea, vomiting, diarrhea, and diffuse abdominal pain.  He had had several watery stools daily, but these were diminishing.  The vomiting actually stopped 48 hours prior to admission. He did say he had some chills the first day of his illness, but that had settled down.  No documented fever.  He denies hematochezia, hematemesis, or prior episodes.  MEDICATIONS ON ADMISSION:  Lotensin, Zocor, Lanoxin, aspirin.  DRUG ALLERGIES:  None known.  PHYSICAL EXAMINATION:  GENERAL:  Older gentleman, pleasant, in mild distress.  VITAL SIGNS:  Temperature 95.7, respirations 20, heart rate 77, blood pressure 125/78.  LUNGS:  Clear to auscultation, but breath sounds were distant.  HEART:  Regular rate and rhythm, no murmur.  Well-healed sternotomy scar.  ABDOMEN:  Was distended with hypoactive bowel sounds.  The abdomen was tympanitic and  diffusely tender, but no peritoneal signs, no mass, no hernia. Well-healed trocar sites.  ADMISSION DIAGNOSTIC DATA:  Hemoglobin 16, white count 9900.  total bilirubin 1.9, but the rest of his liver function tests were normal.  Basic metabolic panel normal.  X-rays were consistent with a partial small-bowel obstruction or an ileus.  HOSPITAL COURSE:  The patient was admitted by Dr. Lenna Gilford.  I was asked to see him.  Because of his diarrhea, stool studies were sent for infectious cause, but these were not helpful.  On the following day, he was really not much better, and so a CT scan was obtained to more definitively identify what was going on.  The CT showed what appeared to be a thrombus in the superior mesenteric artery proximally, whether this was an acute or a subacute event, no one was sure.  On February 05, 2001, it was noted the patients white blood cell count was up to 18,000.  A mesenteric angiogram was obtained which showed what appears to be a superior mesenteric artery embolus.  Dr. Kellie Simmering was asked to see the patient, and he agreed that the patient would need to be explored for thrombectomy and possible bowel resection.  The patient was taken to the operating room on February 05, 2001.  Dr. Kellie Simmering found that the bowel was viable.  I assisted and explored the abdomen and found no signs of any ischemia that was obvious.  Dr. Kellie Simmering performed a thrombectomy of the superior mesenteric artery and was able to reestablish flow.  The patient was stable postoperatively.  Plans were made for second look laparotomy.  Dr.  Dola Argyle was involved.  Echocardiography was performed which showed an ejection fraction of 50 to 55%, hypokinesia of the mid to distal septum.  No pathology for embolus was noted.  The patient was taken back to the operating room on February 07, 2001, for a second look laparotomy.  I found that he had a viable small and large intestine and good flow in his mesenteric  vessels by Doppler.  Intraoperative transesophageal echocardiogram was done by the anesthesiologist, Dr. Linna Caprice. No thrombus was seen in the left atrium.  Appendage was not well visualized but no thrombus seen.  No thrombus in the proximal ascending aorta or descending aorta.  The left atrial septum was intact, no defect.  Left ventricle showed akinesis of the mid to distal septum.  Postoperatively, the patient recovered very slowly but without much problem. He had problems with abdominal distention and ileus and then some diarrhea. It took him several days to get over his abdominal distention.  He did, however, ultimately recover.  Stool studies were repeated but were not helpful.  Cardiology followed him closely and adjusted his medications.  The pathology of the superior mesenteric artery clot showed an organized thrombus.  Dr. Dola Argyle felt that this was consistent with a cardiac source and placed the patient on heparin, and then ultimately he was converted over to Coumadin.  The patient ultimately recovered, resumed diet.  His stools became formed.  He began to feel well and wanted to go home.  He was discharged on February 15, 2001.  At that time, his wounds were healing well.  His abdomen was soft and nontender.  Prothrombin time was 22.5 with an INR of 2.5.  Followup was arranged with [facility] to monitor and regulate his Coumadin dose.  He was asked to follow up with me in my office in two weeks and to follow up with Dr. Kellie Simmering on January 28. Dictated by:   [name]. [name], M.D. Attending Physician:  Barbera Setters DD:  04/13/01 TD:  04/14/01 Job: [id number]

## 2010-06-18 NOTE — Discharge Summary (Signed)
   NAME:  [name], [name]                          ACCOUNT NO.:  [id number]   MEDICAL RECORD NO.:  [id number]                   PATIENT TYPE:  INP   LOCATION:  0380                                 FACILITY:  [facility]   PHYSICIAN:  Docia Chuck. Henrene Pastor, M.D. [facility]             DATE OF BIRTH:  [birth year]   DATE OF ADMISSION:  11/14/2002  DATE OF DISCHARGE:  11/19/2002                                 DISCHARGE SUMMARY   ADMITTING DIAGNOSES:  68. A 77 year old white male with known Crohn's disease of the small bowel     with failure-to-thrive, weight loss, diarrhea, and anorexia; probable     Crohn's exacerbation.  2. History of superior mesenteric arterial occlusion status post embolectomy     January 2003 - Dr. [name].  3. Polymyalgia rheumatica.  4. Recent calf pain and edema, rule out deep vein thrombosis versus     inflammatory process.  5. History of coronary artery disease status post coronary artery bypass     grafting in 1994.  6. Status post laparoscopic cholecystectomy.  7. Hyperlipidemia.   DISCHARGE DIAGNOSES:  1. Improving exacerbation of Crohn's ileitis with good response to     intravenous Solu-Medrol.  2. Polymyalgia rheumatica.  3. Left lower extremity edema and discomfort felt secondary to Crohn's     arthropathy; no evidence for deep vein thrombosis.  4. Chronic anticoagulation secondary to history of mesenteric arterial     occlusion.   CONSULTATIONS:  Dr. Yehuda Mao, rheumatology.   BRIEF HISTORY:  [name] is a very nice 77 year old white male known to Dr.  Deatra Ina who has been having   Dictation ended at this point.     Amy Esterwood, P.A.-C. [place]. Henrene Pastor, M.D. [facility]    AE/MEDQ  D:  11/19/2002  T:  11/19/2002  Job:  [id number]

## 2010-06-18 NOTE — Op Note (Signed)
[place]. [facility]  Patient:    [name], [name] Visit Number: [id number] MRN: [id number]          Service Type: Attending:  Nelda Severe. Kellie Simmering, M.D. Dictated by:   Nelda Severe Kellie Simmering, M.D. Proc. Date: 02/05/01   CC:         [name]. [name], M.D.  Deborra Medina. Lenna Gilford, M.D. [facility]  Carlena Bjornstad, M.D. [facility]   Operative Report  PREOPERATIVE DIAGNOSIS: Acute mesenteric ischemia secondary to embolus of superior mesenteric artery.  POSTOPERATIVE DIAGNOSIS: Acute mesenteric ischemia secondary to embolus of superior mesenteric artery.  OPERATION/PROCEDURE: Exploratory laparotomy with superior mesenteric artery embolectomy.  SURGEON: Nelda Severe. Kellie Simmering, M.D.  FIRST ASSISTANT: Rosetta Posner, M.D.  SECOND ASSISTANT: Marcellus Scott, P.A.  INTRAOPERATIVE CONSULTATION: [name]. [name], M.D.  ANESTHESIA: General endotracheal.  INDICATIONS FOR PROCEDURE: This patient had a seven day history of abdominal discomfort, diarrhea, vomiting, and distention, and was admitted to the hospital on February 02, 2001 for possible small bowel obstruction.  A CT scan revealed thrombus in the superior mesenteric artery and angiogram confirmed what appeared to be an embolus about 6 cm from the origin of the superior mesenteric artery, with normal appearing vessels proximally.  He was then scheduled for exploratory laparotomy with embolectomy.  DESCRIPTION OF PROCEDURE: The patient was taken to the operating room and placed in the supine position, at which time satisfactory general endotracheal anesthesia was administered.  The abdomen and groins were prepped with Betadine scrubbing solution and draped in routine sterile manner.  A midline incision was made in the abdomen and carried down through subcutaneous tissue and linea alba using the Bovie.  The peritoneal cavity was entered and thoroughly explored.  The bowel was quite edematous and fluid-filled throughout.  The stomach was normal in  appearance, slightly distended.  The liver had some fine spiculations, which did not appear to be malignant, and were diffuse throughout both lobes of the liver.  Each of these small whitish areas measured about 1 mm in size.  The gallbladder had been surgically removed.  The entire small bowel was carefully examined and there were no areas of gross infarction.  The duodenum and jejunum were fairly well perfused, but there was an area in the terminal ileum about a foot from the ileocecal valve measuring about 18 inches in length which had some more hemorrhagic changes.  The ileocecal valve appeared normal and the entire colon was not ischemic in appearance.  The transverse colon was elevated and working at the base of the mesentery the superior mesenteric artery was then exposed over a distance of about 4-5 cm and encircled with vessel loops.  It had an excellent pulse at this point.  Heparin 6000 was given intravenously.  The artery was occluded proximally and distally with vascular clamps and a transverse arteriotomy made with a 15 blade and extended with Potts scissors. A 4 Fogarty catheter was then passed distally and would easily go 20 cm, and upon return initially no thrombotic material could be retrieved.  After four passes with the Fogarty eventually two pieces of well organized embolic and thrombotic material, each measuring about 2 cm in length and 1 cm in diameter, were retrieved, followed by excellent backbleeding, and this coincided with the angiogram.  Several additional passes with the Fogarty yielded no further debris.  There was excellent inflow present.  The arteriotomy was closed with two continuous 6-0 Prolene sutures.  The clamp was released and there was an excellent  pulse and Doppler flow in the vessel and in all areas of the mesentery.  The bowel was reexamined after allowing it to become warm, and there were hemorrhagic changes in the serosa in the area described  about a foot proximal to the ileocecal valve, but the rest of the intestines looked quite viable.  Dr. [name] examined the bowel and we both felt that second-look procedure in 48 hours would be indicated to determine bowel viability, but that there was no indication for resection of bowel at this time.  Following thorough irrigation of the peritoneal cavity it was closed with continuous #1 PDS and clips.  A sterile dressing was applied and the patient taken to the recovery room in stable condition. Dictated by:   Nelda Severe Kellie Simmering, M.D. Attending:  Nelda Severe. Kellie Simmering, M.D. DD:  02/05/01 TD:  02/05/01 Job: [id number]

## 2010-06-18 NOTE — Consult Note (Signed)
[place]. [facility]  Patient:    [name], [name] Visit Number: [id number] MRN: [id number]          Service Type: SUR Location: 5621 [id number] [id number] Attending Physician:  Barbera Setters Dictated by:   Wallis Bamberg Johnsie Cancel, M.D. [facility] Proc. Date: 02/05/01 Admit Date:  02/05/2001 Discharge Date: 02/15/2001   CC:         Carlena Bjornstad, M.D. [facility] M. Lenna Gilford, M.D. [facility]   Consultation Report  INDICATION:  Question cardiac source of embolus.  HISTORY OF PRESENT ILLNESS:  The patient is a 77 year old patient with CABG in 1992 by Dr. Servando Snare.  He is followed long term by Dr. [name].  He has not had any history of CVA, TIA, or previous embolic events.  As far as I can tell, there has been no history of paroxysmal atrial fibrillation.  The patient has had a previous anterior wall MI with an EF in the 45% range.  He presented acutely to the hospital today with ischemic bowel.  When the patient was seen in the PACU, he had just undergone laparotomy with embolectomy to the superior mesenteric artery.  He is to have a relook operation by Dr. Lisabeth Register on Wednesday.  The patient had not had any antecedent palpitations, syncope, chest pain, pulmonary edema, or other cardiac problems.  REVIEW OF SYSTEMS:  Otherwise remarkable for significant deterioration.  His condition in the last 24 hours is consistent with ischemic bowel.  SOCIAL HISTORY:  The patient is married with three children.  MEDICATIONS: 1. Aspirin 1 a day. 2. Lotensin 10 mg a day. 3. Zocor 40 mg a day. 4. Lanoxin 0.125 mg a day.  PHYSICAL EXAMINATION:  GENERAL:  The patient is intubated in the PACU.  NECK:  There was no JVD elevation.  LUNGS:  Clear.  He appears to have a two to three-component friction rub.  ABDOMEN:  Has just been operated on.  EXTREMITIES:  Distal pulses are intact with no edema.  LABORATORY DATA:  His EKG shows sinus rhythm with previous anterior wall MI with very  little R wave in V4.  There are also some biphasic T waves in I and aVL.  IMPRESSION:  I talked to the patients family at length.  Clearly in the setting of an embolus to the superior mesenteric artery, I think that the patient needs a transesophageal echocardiogram.  We will try to arrange this for Wednesday when he is in the operating room for relook since he will be anesthetized and intubated at the time.  In either case, the patient will need to be on heparin and Coumadin.  The patient will have low-dose heparin started as soon as he can postop per Dr. Kellie Simmering.  He will have a transthoracic echocardiogram as soon as possible.  In terms of his cardiac medications, we will hold his digoxin.  As Dr. [name] indicated, there is some literature that it may act as a vasoconstrictor in the abdomen.  We will rule him out myocardial infarction and further watch his heart as I cannot explain the apparent friction rub on exam.  Further recommendations will be based on the results of his hemodynamics and intraoperative TEE on Wednesday. Dictated by:   Wallis Bamberg Johnsie Cancel, M.D. [place] Attending Physician:  Barbera Setters DD:  02/05/01 TD:  02/06/01 Job: [id number]

## 2010-06-18 NOTE — Op Note (Signed)
[name], [name] NO.:  [id number]   MEDICAL RECORD NO.:  [id number]          PATIENT TYPE:  INP   LOCATION:  4735                         FACILITY:  [facility]   PHYSICIAN:  Champ Mungo. Lovena Le, M.D.  DATE OF BIRTH:  Aug 29, 1931   DATE OF PROCEDURE:  09/07/2004  DATE OF DISCHARGE:                                 OPERATIVE REPORT   PROCEDURE PERFORMED:  Implantation of a biventricular implantable  cardioverter-defibrillator.   SURGEON:  Champ Mungo. Lovena Le, M.D.   INDICATIONS:  Ischemic cardiomyopathy, congestive heart failure, and bundle  branch block as part of the MADIT CRT study.   I. INTRODUCTION:  The patient is a 77 year old male with known coronary  artery disease and severe LV dysfunction with an EF of 15%.  He has left  bundle branch block.  With maximal medical therapy, he has class II  congestive heart failure.  The patient has an indication for prophylactic  ICD implantation is referred as part of the MADIT CRT study for BiV ICD  implantation.   II. PROCEDURE:  After informed consent was obtained, the patient was taken  to the diagnostic EP lab in a fasting state.  After the usual preparation  and draping, intravenous fentanyl and midazolam were given for sedation.  Thirty milliliters of lidocaine were infiltrated into the left  infraclavicular region.  A 9-cm incision was carried out over this region  and electrocautery utilized to dissect down the fascial plane.  Ten  milliliters of contrast were injected into the left upper extremity venous  system demonstrated a patent left subclavian vein.  It was subsequently  punctured and the Guidant model [id number] active-fixation defibrillation  lead was advanced into the right ventricle, the Medtronic Flex-Stent bipolar  pacing lead, serial number [id number], serial number [id number], was advanced into the  right atrium.  Mapping was carried out in the right ventricle and at the  final site on the RV septum, the R  waves measured 9 mV and the pacing  impedance was 1000 ohms.  The pacing threshold with the lead actively fixed  was 0.3 volts at 0.5 milliseconds.  Ten-volt pacing did not stimulate the  diaphragm.  With the right ventricular lead in satisfactory position,  attention was then turned to placement of the right atrial lead.  Mapping  was carried out in the right atrium and at the anterolateral region of the  right atrium, the P waves measured 2 mV and the pacing impedance was 675  ohms with the lead actively affixed.  The pacing threshold was 1 volt at 0.5  milliseconds.  With the right atrial lead in satisfactory position,  attention was then turned to placement of the left ventricular lead.  The  coronary sinus guiding catheter was advanced into the right atrium.  Cannulation of the coronary sinus was carried out without particular  difficulty.  Venography the coronary sinus was carried out in RAO and LAO  projection.  The Guidant EZ tract II IS-1 bipolar LV pacing lead, serial  number [id number], was advanced over an angioplasty guidewire into a  lateral vein in the left ventricle.  At this location, 10-volt pacing did  not stimulate the diaphragm.  The LV waves measured 8 mV.  The pacing  impedance was 980 ohms and the pacing threshold was 0.5 volts at 0.5  milliseconds.  With the LV lead in satisfactory position, the right atrial,  ICD, and LV leads were secured to the subpectoralis fascia with a figure-of-  eight silk suture.  The sew-in sleeve was secured with a silk suture.  Electrocautery was utilized to make a subcutaneous pocket.  Kanamycin  irrigation was utilized to irrigate the pocket.  Electrocautery was utilized  to assure hemostasis.  The Guidant Contact Renewal III, model H170, serial  number [id number], was connected to the defibrillation lead and placed in the  subcutaneous pocket.  Defibrillation, right atrial, and LV pacing leads were  then placed in the subcutaneous  pocket.  The generator was secured with a  silk suture.  Additional kanamycin was utilized to irrigate the pocket.  Defibrillation threshold testing was carried out.   After the patient was more deeply sedated with fentanyl and Versed, VF was  induced with a T wave shock.  A 14-joule shock was delivered which  terminated ventricular fibrillation and restored sinus rhythm.  Five minutes  were allowed to elapse and a second DFT test carried out.  Again, VF was  induced with a T wave shock and another 14-joule shock was delivered,  terminating ventricular fibrillation and restoring sinus rhythm.  At this  point, no additional defibrillation threshold testing was carried out and  the incision was closed with layer of 2-0 Vicryl followed by a layer of 3-0  Vicryl, followed by a layer of 4-0 Vicryl.  Benzoin was painted on the skin,  Steri-Strips were applied and a pressure dressing was placed, and the  patient was returned to his room in satisfactory condition.   III. COMPLICATIONS:  There were no immediate procedural complications.   IV. RESULTS:  This demonstrated successful implantation of a Guidant  biventricular ICD in a patient with congestive heart failure, ischemic  cardiomyopathy with an EF 15% and left bundle branch block as part of the  MADIT CRT study.       GWT/MEDQ  D:  09/07/2004  T:  09/08/2004  Job:  [id number]   cc:   Florinda Marker, MD  Fax: [phone]   Deborra Medina. Lenna Gilford, M.D. [facility]

## 2010-06-18 NOTE — Consult Note (Signed)
[facility]  Patient:    [name], [name] Visit Number: [id number] MRN: [id number]          Service Type: MED Location: [phone] Attending Physician:  Marshia Ly Dictated by:   Nelda Severe Kellie Simmering, M.D. Proc. Date: 02/05/01 Admit Date:  02/01/2001 Discharge Date: 02/05/2001   CC:         Nicki Reaper M. Lenna Gilford, M.D. [facility] M. [name], M.D.   Consultation Report  REASON FOR CONSULTATION:  Clot in superior mesenteric artery.  HISTORY OF PRESENT ILLNESS:  I appreciate the opportunity of seeing this 77 year old white male patient in consultation regarding his superior mesenteric ischemia.  This patient of Dr. Teressa Lower presented with a history of abdominal cramping pain with nausea and vomiting and diarrhea that began on December 26.  The vomiting subsided over the next three to four days, but he continued to have watery diarrhea and abdominal distention.  He was admitted on January 3 for possible small bowel obstruction.  Consultation by Dr. Fanny Skates was obtained, and he was following the patient for possible small bowel obstruction.  The patient had no previous bouts of abdominal distention, vomiting, or diarrhea, but has had previous laparoscopic cholecystectomy by Dr. [name] in the past and no other abdominal procedures. His abdominal x-rays at the time of admission were consistent with small bowel obstruction and during his hospital course, he has had no fever or acidosis but has had an elevated white count today of 17,000 and a CT scan was obtained on January 4, which revealed questionable thrombus in the superior mesenteric artery.  Mesenteric angiography was performed today and I reviewed this with Dr. Brayton El, and this revealed a filling defect in the midportion of the superior mesenteric artery, which appeared to be an embolus, with no evidence of obstruction proximally.  PAST MEDICAL HISTORY: 1. Positive for coronary  artery disease, underwent coronary artery bypass    grafting in 1993 by Dr. Lanelle Bal and is followed now by Dr. Dola Argyle. 2. Hypertension. 3. Hypercholesterolemia.  Denies diabetes mellitus, deep venous thrombosis, thrombophlebitis, chronic obstructive pulmonary disease, previous CVA or TIA.  PAST SURGICAL HISTORY:  Cholecystectomy and coronary artery bypass grafting.  MEDICATIONS:  Zocor 40 mg every night, Lotensin 10 mg daily, Lanoxin 0.125 mg daily but no history of atrial fibrillation, aspirin 81 mg daily.  ALLERGIES:  None.  SOCIAL HISTORY:  The patient is a former smoker.  Denies alcohol or drug use.  FAMILY HISTORY:  Positive for coronary artery disease and cancer, unknown type.  REVIEW OF SYSTEMS:  Otherwise unremarkable.  PHYSICAL EXAMINATION:  VITAL SIGNS:  Temperature is 97, blood pressure 130/80, heart rate is 77 and regular, respirations are 18.  GENERAL:  He is a healthy-appearing male patient in no apparent distress.  He is alert and oriented x 3.  NECK:  Supple, 3+ carotid pulses, no bruits are audible.  There is no palpable adenopathy in the neck.  Neurologic normal.  CHEST:  Clear to auscultation and percussion.  CARDIAC:  Upper extremity pulses are intact at 3+ with good strength bilaterally.  ABDOMEN:  Distended with mild discomfort but no rebound tenderness.  EXTREMITIES:  Femoral, popliteal, and dorsalis pedis pulses are 3+ bilaterally with no evidence of ischemia or edema.  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  RECOMMENDATION:  Will transfer to [facility] immediately for exploratory laparotomy in conjunction with Dr. [name].  Will attempt mesenteric embolectomy and will then assess bowel viability.  Risks fully discussed with the patient and the patients wife. Dictated by:   Nelda Severe Kellie Simmering, M.D. Attending Physician:  Marshia Ly DD:  02/05/01 TD:  02/05/01 Job:  [phone] [id number]

## 2010-06-18 NOTE — Discharge Summary (Signed)
NAMEVUE, PAVON NO.:  [id number]   MEDICAL RECORD NO.:  [id number]          PATIENT TYPE:  INP   LOCATION:  4735                         FACILITY:  [facility]   PHYSICIAN:  Champ Mungo. Lovena Le, M.D.  DATE OF BIRTH:  12/04/1931   DATE OF ADMISSION:  09/07/2004  DATE OF DISCHARGE:  09/08/2004                                 DISCHARGE SUMMARY   ALLERGIES:  No known drug allergies.   DISCHARGE DIAGNOSES:  1.  On discharging day number one status post implantation of a Guidant      CONTAK Renewal 3 model #H170 cardioverter defibrillator with cardiac re-      synchronization therapy by way of a left ventricular lead.  2.  Evidence of left ventricular lead movement by post-procedure chest x-      ray, BUT left ventricular coil to ring threshold is satisfactory.  No      lead revision at this time.  3.  Chills/rigors/fever post-procedure.  The patient had a history of      chills, followed by a fever prior to admission.  He had one spike this      admission to 103 degrees, but nothing further for the last 12 hours.  4.  Hematuria found on urinalysis, a large amount of blood noted, with a few      leukocytes.  The patient will discharge on Cipro 500 mg b.i.d. for      possibly urinary tract infection/prostatitis.  5.  Strongly recommend outpatient followup for male hematuria and possibly      urology consultation.  6.  Class 2 congestive heart failure, on maximum medical therapy (prior      history of class 3 congestive heart failure).  7.  Blood cultures x2 in the setting of a fever on September 07, 2004.  Studies      are pending at the time of discharge.  8.  Ischemic cardiomyopathy, possibly mixed cardiomyopathy with an ejection      fraction of 15%.  9.  A candidate for MADIT-CRT.   SECONDARY DIAGNOSES:  1.  History of coronary artery disease, status post coronary artery bypass      graft surgery.  2.  Left heart catheterization on July 19, 2004.  The study showed  that the      left main had a 30% tubular stenosis.  The left anterior descending      coronary artery had a 100% occlusion proximally.  The left circumflex      artery had an 80% first obtuse marginal lesion and a focal stenosis mid-      vessel.  The right coronary artery was 100% occluded proximally.  A      saphenous vein graft to the diagonal was 100% occluded.  A saphenous      vein graft to the posterior descending coronary artery/posterolateral      branch had a 30%-40% mid and distal stenoses, but no critical lesions.      The left internal mammary artery to the left anterior descending  coronary artery was widely patent.  The ejection fraction was noted to      be 15%.  A trace of mitral regurgitation.  No evidence of mural      thrombus.  3.  An echocardiogram on July 14, 2004.  The study showed an ejection      fraction severely reduced to less than 25%.  The left ventricle is      mildly dilated.  No evidence of mitral stenosis, fluttering or prolapse.      Moderate mitral regurgitation of 2+.  4.  Hypertension.  5.  Dyslipidemia.  6.  History of Crohn's disease.  7.  Status post exploratory laparotomy for mesenteric embolus.  The      patient's abdominal wound is healing by secondary intention, almost      completely healed.  The patient is on Coumadin for this.  8.  Gastroesophageal reflux disease.  9.  Multiple ventral herniae.  10. Left bundle branch block.  11. Once again the patient was found with hematuria this hospitalization.  12. History of intermittent rigors and fevers prior to admission.   PROCEDURE:  On September 07, 2004, an implantation of a bi-valve ICD, a Guidant  CONTAK Renewal 3 by Dr. Champ Mungo. Lovena Le.  The left ventricular lead was  noted to have moved slightly on a post-procedure chest x-ray on the morning  of September 08, 2004; however, there was good LV coil to ring threshold.  No  need for a left ventricular lead revision at this time.   HISTORY OF  PRESENT ILLNESS:  Mr. Schirmer is a 77 year old male.  He has a  history of coronary artery disease and is status post coronary artery bypass  graft surgery.  The ejection fraction at the left heart catheterization in  June 2006, was 15%.  The study showed occlusions of the right coronary  artery and the LAD, an occlusion of a saphenous vein graft to the diagonal.  A saphenous vein graft to the distal right coronary artery and the LIMA to  the LAD are patent.  It is thought that his left ventricular dysfunction is  out of proportion to his actual coronary artery disease.  The patient has  had in the past class 3 congestive heart failure in the setting of a left  bundle branch block; however, up-titration of medications have reduced the  class 3 to class 2 heart failure.  The patient does not have syncope or  palpitations with class 2 heart failure, left bundle branch block, a history  of a prior myocardial infarction, a decreased ejection fraction.  The  patient is a candidate for a cardioverter defibrillator and also to  randomization on the MADIT-CRT study.  Will present electively for this  procedure.   HOSPITAL COURSE:  The patient presented on September 07, 2004.  He underwent the  procedure which included an implantation of a cardioverter defibrillator and  a left ventricular lead without complications.  It was noted that he had  hematuria both visibly and on a urinalysis.  He also had a post-procedure  fever x1, which was 103 degrees.  No further fevers after shaking chills for  the last 12 hours.  The patient goes home with Cipro 500 mg b.i.d. in  addition to his regular medications, and with indication for followup with  his primary caregiver, Dr. Deborra Medina. Lenna Gilford, for hematuria.   DIET:  The patient is discharged on September 08, 2004, with a low-sodium, low-  cholesterol  diet.   DISCHARGE INSTRUCTIONS:  1.  He is asked not to drive for the next week. 2.  He is not to lift any heavy lifts  in the next two weeks.  3.  He is to keep his incision dry for the next seven days and to sponge      bathe until Tuesday, September 14, 2004.   DISCHARGE MEDICATIONS:  1.  For pain control Tylenol 325 mg, one to two tab q.4-6h. p.r.n.  2.  The following medications are to be continued:  Zocor 20 mg daily at      bedtime.  3.  Coumadin 7.5 mg daily except for 5 mg on Monday, Wednesday and Friday.  4.  Mavik 2 mg daily.  5.  Lasix 40 mg daily.  6.  Potassium chloride 20 mEq daily.  7.  Coreg 6.25 mg b.i.d.  8.  Cipro 500 mg, one p.o. b.i.d. for the next eight days.   FOLLOWUP:  1.  To follow up at Dr. Jeannine Kitten office to follow up his blood in the urine.  2.  To follow up at the [facility] at [facility] at [address] on Wednesday, September 22, 2004, at 9:15 a.m.  3.  He will see Dr. Cristopher Peru on Tuesday, September 25, 2004, at 2:40 p.m.   LABORATORY DATA:  This admission were a complete blood count with white  cells of 6, hemoglobin 13.9, hematocrit 41.5, platelets 169.  Serum  electrolytes:  Sodium 140, potassium 3.5, chloride 101, bicarbonate 31,  glucose 124, BUN 11, creatinine 1.2.  INR was 2.5 on September 02, 2004.  PT is  24 and 4.  Once again the urinalysis with a large amount of red blood cells  and a few leukocytes.      Soledad Gerlach   GM/MEDQ  D:  09/08/2004  T:  09/08/2004  Job:  [id number]   cc:   Dola Argyle, M.D.   Deborra Medina. Lenna Gilford, M.D. [facility]

## 2010-06-18 NOTE — Consult Note (Signed)
[facility]  Patient:    JERIC, SLAGEL Visit Number: [id number] MRN: [id number]          Service Type: MED Location: [phone] Attending Physician:  Marshia Ly Dictated by:   [name]. [name], M.D. Proc. Date: 02/02/01 Admit Date:  02/01/2001   CC:         Deborra Medina. Lenna Gilford, M.D. [facility]   Consultation Report  REASON FOR CONSULTATION:  Evaluate abdominal pain and vomiting.  HISTORY OF PRESENT ILLNESS:  This is a 77 year old white man who has been in reasonably stable health until six days ago.  After a meal, he developed nausea and repeated bouts of nausea and vomiting and watery diarrhea.  For the past five days, he has had diffuse abdominal pain, somewhat crampy at time, to go along with this.  He vomited for several days but that has resolved and he has not had any vomiting for 48 hours.  He continues to have watery diarrhea but it is of lower frequency now and now he is having about two watery bowel movements per day.  He had some chills several days ago but that has resolved. He denies any documented fever.  He denies seeing any blood in his stools and denies vomiting any blood, denies any similar prior episodes.  It is interesting that his wife states that several family members had similar symptoms with nausea, vomiting and diarrhea but that was very short-lived and everyones symptoms have resolved except for his.  He was evaluated by Dr. Deborra Medina. [place] and admitted because of concern for small-bowel obstruction.  PAST HISTORY:  Laparoscopic cholecystectomy in February of 2002.  He did well following that surgery.  Coronary artery disease, status post coronary artery bypass grafting in 1992, did well following that.  He has elevated cholesterol.  He has hypertension.  CURRENT MEDICATIONS:  Lotensin, Zocor, Lanoxin, aspirin.  DRUG ALLERGIES:  None known.  PHYSICAL EXAMINATION:  GENERAL:  A pleasant older gentleman who is in mild  distress, cooperative.  VITAL SIGNS:  Blood pressure 125/78, heart rate 77, respiratory rate 20, temperature 95.7.  HEENT:  Sclerae clear.  Extraocular movements intact.  NECK:  Supple, nontender, no mass.  HEART:  Regular rate and rhythm.  No murmur.  Well-healed sternotomy scar.  LUNGS:  Breath sounds are clear but the breath sounds are distant.  ABDOMEN:  Distended and tympanitic with hypoactive bowel sounds.  The abdomen is soft but is diffusely tender.  There is no guarding or rebound.  There is no mass.  There is no hernia.  The trocar sites from his previous cholecystectomy are well-healed.  GENITALIA:  Normal male.  No hernia noted.  ADMISSION DATA:  Abdominal x-rays are consistent with either a partial small-bowel obstruction or ileus and gastroenteritis.  He has diffusely dilated small bowel and also has a lot of colon gas as well.  There are some air-fluid levels suggesting a possible mechanical component to this, although it is not diagnostic.  Hemoglobin is 16, white blood cell count 9900.  Sodium 136, potassium 4.3, BUN 16, creatinine 1.0, glucose 118, total bilirubin 1.9, other liver function tests normal.  IMPRESSION: 1. Abdominal pain and vomiting, possibly due to partial mechanical small-bowel    obstruction or possibly due to infectious enterocolitis. 2. 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 3. Coronary artery disease, status post coronary artery bypass grafting. 4. Hypertension. 5. Status post laparoscopic cholecystectomy.  PLAN: 1. I agree with your management plan.  The patient  will be admitted and be    rehydrated with IV fluids, be placed at bowel rest and will be n.p.o.  If    he resumes vomiting, he will need to have a nasogastric tube inserted. 2. We will send stool for culture and sensitivity, white blood cell smear, ova    and parasites and C. difficile toxin to rule out infectious problems. 3. We will repeat his lab work and x-rays  tomorrow morning to see whether his    abdominal distention is resolving or not. Dictated by:   [name]. [name], M.D. Attending Physician:  Marshia Ly DD:  02/02/01 TD:  02/03/01 Job: [id number]/[id number]

## 2010-06-18 NOTE — Procedures (Signed)
[place]  Patient:    [name], [name] Visit Number: [id number] MRN: [id number]          Service Type: SUR Location: [phone] Attending Physician:  Derry Lory Dictated by:   Glynda Jaeger, M.D. Proc. Date: 02/07/01 Admit Date:  02/05/2001                             Procedure Report  PROCEDURE: Intraoperative transesophageal echocardiography.  ANESTHESIOLOGIST: Glynda Jaeger, M.D.  INDICATIONS FOR PROCEDURE: [name] is a 77 year old white male, who presented to [place] on February 02, 2001 with acute abdominal pain.  He was subsequently found to have developed a mesenteric artery thrombus, which was felt to be an embolus.  He underwent intraoperative embolectomy on February 05, 2001.  A subsequent transesophageal echocardiogram failed to identify any thrombus noted in the heart.  He is now returned to the operating room for an exploratory laparotomy to determine if there is any necrotic bowel present and to check on the status of his superior mesenteric artery.  Intraoperative transesophageal echocardiogram is requested to determine if any source of embolus is present in the heart.  DESCRIPTION OF PROCEDURE: The patient was brought to the operating room at [facility] and general anesthesia was induced without difficulty.  The stomach was suctioned through the preexisting nasogastric tube.  The nasogastric tube was then removed and the transesophageal echocardiography probe was inserted into the esophagus without difficulty.  IMPRESSION:  1. There was one left ventricle.  There was akinesis of the interventricular     septum noted from the mid papillary muscle area to the apex.  There was     spontaneous echo contrast or smoke noted in the left ventricular cavity.     There was no thrombus noted in the left ventricular cavity.  Left     ventricular ejection fraction was  estimated at 45%.  2. The left atrium appeared to be within normal limits for size.  There was     no spontaneous echo contrast noted in the left atrium.  It was somewhat     difficult to optimally visualize the left atrial appendage because it     appeared to be compressed, but on multiple views I was unable to     appreciate any thrombus in the left atrial appendage.  3. The interatrial septum was intact.  There was no patent foramen ovale.  4. There was no thrombus or severe atherosclerotic disease present in the     proximal ascending aorta or the descending aorta.  5. The mitral valve structurally appeared normal.  The leaflets were pliable     and coapted well without prolapse.  There were no vegetations on the     leaflets, and there was 1+ mitral insufficiency.  6. There was no aortic insufficiency.  The aortic valve appeared to be normal     appearing.  7. Right ventricular function appeared to be within normal limits.  8. The tricuspid valve appeared structurally intact, with 1+ tricuspid     insufficiency. Dictated by:   Glynda Jaeger, M.D. Attending Physician:  Derry Lory DD:  02/07/01 TD:  02/08/01 Job: [id number]

## 2010-06-18 NOTE — Assessment & Plan Note (Signed)
[facility]                              CARDIOLOGY OFFICE NOTE   NAME:[name], [name]                          MRN:          [id number]  DATE:09/22/2005                            DOB:          Jan 28, 1932    [name] is here for followup.  He did have a follow-up 2-D echo a few days  ago.  I read it and I feel his ejection fraction is 35% and possibly as high  as 40%.  This is definitely an improvement and we are very pleased with his  progress.  He is on full dose Coreg.   The prior notes outline completely all aspects of his care.  With this 2-D  echo result my plan is not to change any of his medications at this time.  Problems are listed completely on my note of May 13, 2005.   Patient's EKG shows sinus rhythm today.  He is stable.  There will be no  change in his medicines.  We are trying to have him be on a generic of any  medicine possible.  I will see him back in six months.                               Carlena Bjornstad, MD, [facility]    JDK/MedQ  DD:  09/22/2005  DT:  09/22/2005  Job #:  [id number]

## 2010-06-18 NOTE — H&P (Signed)
[facility]  Patient:    [name], [name]                          MRN: [id number] Adm. Date:  [id number] Attending:  Barbera Setters CC:         Deborra Medina. Lenna Gilford, M.D. [facility]  Carlena Bjornstad, M.D. [facility]   History and Physical  CHIEF COMPLAINT:  Right upper quadrant abdominal pain and gallstones.  HISTORY OF PRESENT ILLNESS:  This is a 77 year old white man who was in his usual state of reasonably good health until 4 a.m. this morning when he awoke with right-sided abdominal pain.  The pain has gotten somewhat worse and is now spreading in a larger area of his right upper quadrant.  He has been nauseated but has not vomited.  He denies fever or chills.  He has not had any prior episodes.  He denies any history of liver disease, pancreatic disease or peptic ulcer disease.  He was seen in Dr. Deborra Medina. [place] office today.  An ultrasound showed numerous gallstones and a common bile duct which measured only 4.7 mm, which is normal.  The liver and kidneys looked fine.  It was felt that he has acute cholecystitis and he was sent to the [facility] Emergency Room and I was asked to see him by Dr. Lenna Gilford.  The patient has coronary artery disease and had a coronary artery bypass graft about 8 to 10 years ago by Dr. Percell Miller B. Servando Snare; he has done well.  He is status post nasal fracture.  He has hypertension.  CURRENT MEDICATIONS 1. Zocor 40 mg per day. 2. Lotensin (dose unknown) one every day. 3. Lanoxin (dose unknown) one every day.  DRUG ALLERGIES:  None known.  SOCIAL HISTORY:  The patient is married and has three children.  He continues to work as a Administrator, sports.  He denies the use of tobacco.  He drinks alcohol but not very often.  FAMILY HISTORY:  Mother deceased at age 66 of congestive heart failure. Father deceased, age 42, of myocardial infarction.  He had six brothers and three sisters.  Several of them have passed away.  There is cancer  in the family.  There are numerous family members with coronary artery disease. There is one family member with myasthenia gravis.  REVIEW OF SYSTEMS:  All the systems are reviewed and are negative except as described above.  PHYSICAL EXAMINATION  GENERAL:  Older, pleasant white man in mild distress.  VITAL SIGNS:  Temperature 97.8, pulse 61, respiratory rate 24, blood pressure 165/92.  HEENT:  Sclerae appeared clear.  Extraocular movements intact.  Oropharynx clear.  NECK:  Supple, nontender, without mass.  No bruit.  LUNGS:  Clear to auscultation.  BACK:  No CVA tenderness.  HEART:  Regular rate and rhythm.  No murmur.  Well-healed sternotomy scar.  ABDOMEN:  Slightly distended.  Diminished bowel sounds.  Tender with involuntary guarding in the right upper quadrant.  No mass.  No hernia.  GENITALIA:  Normal penis, scrotum and testes.  No inguinal hernia or adenopathy.  EXTREMITIES:  No edema.  Good pulses.  NEUROLOGIC:  Grossly within normal limits.  LABORATORY AND X-RAY FINDINGS:  Lab work showed a WBC count of 11,100 with a left shift and a hemoglobin of 15.7.  Urinalysis is basically normal.  Liver profile is pending.  Amylase is pending.  IMPRESSION 1. Acute cholecystitis with cholelithiasis. 2. Coronary artery  disease. 3. Hypertension.  PLAN:  The patient will be admitted and started on antibiotics.  We will check EKG and liver profile.  If everything seems in order, we will probably proceed with laparoscopic cholecystectomy tonight.  I have discussed the indications and details of surgery with the patient and his wife.  Risks and complications have been outlined including, but not limited to, conversion to open laparotomy, bleeding, infection, bile leak, bile duct injury, intestinal injury and other unforeseen complications.  They seem to understand all these issues well and at this time, all of their questions were answered.  They agree with this  plan. DD:  03/23/00 TD:  03/24/00 Job: [id number]/[id number]

## 2010-06-18 NOTE — H&P (Signed)
NAME:  ZESHAN, SENA                          ACCOUNT NO.:  [id number]   MEDICAL RECORD NO.:  [id number]                   PATIENT TYPE:  INP   LOCATION:  0380                                 FACILITY:  [facility]   PHYSICIAN:  [name]. Deatra Ina, M.D. [facility]          DATE OF BIRTH:  07-01-1931   DATE OF ADMISSION:  11/14/2002  DATE OF DISCHARGE:                                HISTORY & PHYSICAL   REASON FOR ADMISSION:  Failure to thrive and ongoing diarrhea in a patient  with known Crohn's of the terminal ileum.   HISTORY OF PRESENT ILLNESS:  Mr. Moffa is a very pleasant but, unfortunately  at this time, ill and noncomplaining 77 year old white gentleman.  He had a  history of diarrhea since May 2004, and had been worked up with CT scan of  the abdomen, negative colonoscopy to the cecum, and finally a diagnosis of  Crohn's disease was made based on a small bowel follow-through September 23, 2002, showing findings consistent with Crohn's in the distal and terminal  ileum.  He was admitted briefly to the hospital August 25 through September 27, 2002, for initiation of therapies.  Initiation of Pentasa for the Crohn's  disease.  He had already been on chronic low to moderate dose prednisone at  8 mg daily for treatment of polymyalgia rheumatica.  At discharge, he was  released on 40 mg daily with a taper to begin at 14 days.  Most recently, he  has been on 5 mg daily, but earlier this week because of ongoing symptoms  and feeling lousy, he self-increased the dose up to 10 mg daily.   The patient has been having a pattern of about 3-4 stools daily that start  early about 2 o'clock in the morning and continue until about noontime.  He  has a large period of time during the day when he does not have stools in  the afternoon and evening, but the pattern starts up again in the early  morning.  He has been having fevers sometimes as high as 103-104 at home  associated with profuse sweating.  He has had  chills and possibly even  rigors.  He has not been eating well at all.  He feels terrible.  He has  minimal activity, mostly just sitting in a lounge chair or in bed.  Simple  activities such as walking to the bedroom or bathroom exhaust him and causes  him shortness of breath.  He is also complaining of calf pain that was in  both calves a few weeks back, but now is just in the left calf and it is  tender to the touch.  He denies any pleuritic pain and has nothing to  suggest angina.   In addition to the prednisone, the patient has been on a stable dose of  Pentasa of 1 g q.i.d. in addition to chronic Coumadin and various  blood  pressure and lipid-lowering medications.  The patient was seen around  November 06, 2002, by Dr. Deatra Ina in the office at which time laboratories were  drawn.  Laboratories revealed white blood cell count 16.3 with something of  a left shift.  Sedimentation rate elevated at 68.  Glucose elevated at 187,  and he has had some problems with early onset type 2 diabetes, but has not  required any oral agents for management of this.  Electrolytes were notable  for slight decrease in the sodium, but normal potassium.  BUN and creatinine  okay.  Albumin low 2.1.   A CT scan of the abdomen with oral and IV contrast was obtained on November 07, 2002.  This showed thickened single loop of small bowel with adjacent  inflammatory changes in the left abdomen likely secondary to his known  Crohn's disease.  No abscesses or pneumoperitoneum were seen.  Stable  infrarenal abdominal aortic aneurysm was seen.  Stable very small lesions  were seen in the liver and kidney which were too small to characterize.  There was also an incidental lipoma seen in the right iliopsoas muscle.  Overall, the inflammation seen in the left abdomen/pelvis consistent with  his Crohn's disease was the same area involved on previous CAT scan of July 10, 2002.   The patient was seen in the office today by  Dr. Erskine Emery and looks ill.  He has had failure to thrive with a 15 pound weight loss since his  hospitalization in August, and his weight has gone from 191 pounds down to  185 pounds today compared with his office visit of November 06, 2002.  He is  being admitted by Dr. Deatra Ina for supportive care and IV medications.   The patient is also reporting a lot of burning and epigastric discomfort  postprandially.  He is having persistent aphthous type ulcers on his tongue  and has a white exudate on the tongue consistent with yeast.  Magic  mouthwash is not helping any of his oral complaints.  The patient has not  seen any blood in his stools.  He has not had much nausea, although this  morning just before he went to the doctor's office, he did have an episode  of nausea with dry heaves, but this was really the first time that this has  occurred.   CURRENT MEDICATIONS:  1. Pentasa 250 mg four pills q.i.d.  2. Prednisone 10 mg daily.  3. Coumadin 5 mg daily.  4. Toprol XL 25 mg daily.  5. Altace 5 mg daily.  6. Zocor 20 mg daily.   ALLERGIES:  No known drug allergies.   PHYSICIANS:  1. Dola Argyle, M.D.  2. [facility] M. Lenna Gilford, M.D.  3. Jeneen Rinks D. Kellie Simmering, M.D.  4. Renelda Loma M. [name], M.D.   PAST MEDICAL HISTORY:  1. Above history of Crohn's ileitis.  2. History of colon polyps.  He had a colonoscopy on August 29, 2002, to the     cecum.  Tubular adenomatous polyps were removed from the cecum,     descending, and sigmoid colon.  Left-sided diverticulosis also noted.  3. Status post laparoscopic repair of ventral incisional hernia with mesh in     November 2003.  4. History of acute mesenteric ischemia and SMA embolus, status post     embolectomy January 2003.  5. Status post laparoscopic abdominal evaluation in January 2003, following     the embolectomy which revealed viable bowel.  6. Chronic Coumadin therapy. 7. Polymyalgia rheumatica.  He has been on chronic prednisone for this.   8. Coronary artery disease, status post CABG in 1994.  9. Hypertension.  10.      Ejection fraction 45% by TEE, January 2003.  11.      Status post laparoscopic cholecystectomy in 2002.  12.      Hypercholesterolemia.  Note, the patient has never undergone upper     endoscopy to his knowledge.   SOCIAL HISTORY:  The patient is married.  He and his wife have three  children.  They live in [place].  Does not drink alcohol, previously did  have a rare beer, but not recently.  Quit smoking tobacco in the 1970s.  Retired from his former occupation of heavy equipment operative, but still  sometimes helps out with people operating equipment, and he likes to keep  busy around the house repairing things and tending his property.  However,  of late he has not been able to do any of this because of exhaustion.   FAMILY HISTORY:  Coronary artery disease, MI, myasthenia gravis.  There has  been cancer in two of his brothers, but the type is not known.   REVIEW OF SYSTEMS:  NEUROLOGIC:  Does have some upper extremity tremor.  No  focal weakness.  No headaches.  Vision is not blurry.  PULMONARY:  As above.  GENITOURINARY:  Urinates with some increased frequency maybe 2-3 times a  night.  No foul smell to the urine, no hematuria.  He voids easily.  MUSCULOSKELETAL:  Calf pain as described above.  HEMATOLOGIC:  No bleeding  problems.  He does bruise easily on the upper extremities.  ENT:  Oral  ulcers as described above.  PSYCHIATRIC:  Feels depressed and has no energy.  All other systems reviewed and were negative.   PHYSICAL EXAMINATION:  GENERAL:  The patient is a pleasant older white  gentleman.  His affect is flat and physically he appears depressed sitting  over in a hunched over position.  He does answer questions appropriately and  his wife is also answering some of the questions.  VITAL SIGNS:  Temperature 101.1, pulse 102, respirations 20, blood pressure  100/68, weight 185 pounds.   HEENT:  Sclerae are nonicteric.  Conjunctivae pink.  Extraocular movements  intact.  Oropharynx:  Tongue is coated with a white exudate, there is an  ulcer at the tip of his tongue with a slight off-white eschar to it.  No  oral blood seen.  NECK:  There is no JVD, bruits, or goiter.  CHEST:  Clear to auscultation and percussion bilaterally with good breath  sounds.  HEART:  Slightly tachycardic, but regular rhythm.  There is a soft 1/6  systolic ejection murmur at the left sternal border.  ABDOMEN:  Nondistended, soft, with hypoactive bowel sounds.  There is an  incisional hernia present.  No hepatosplenomegaly or mass.  He is not  tender.  There is a long midline incisional scar which is well healed.  RECTAL/GENITALIA:  Deferred.  EXTREMITIES:  Calf width/diameter is the same in both legs.  He does have  point tenderness in the left calf.  No edema is present in the calf, ankles, or feet.  Dorsalis pedis pulses are 2-3+ bilaterally.  Capillary refill is  within normal limits.  NEUROLOGICAL:  The patient is alert and oriented x3.  He has a slight tremor  of the upper extremities at rest.  Grip and  pedal strength are 5/5  bilaterally.  PSYCHIATRIC:  The patient is depressed, but not emotionally labile.  He is  not tearful.  He is entirely appropriate.  DERMATOLOGIC:  I do not see any rashes or lesions on the extremities or  trunk.   LABORATORY DATA:  Pending at the present time are PT/INR, CBC with  differential, urinalysis, CMET, hemoglobin A1c, and sedimentation rate.  Stool for ova and parasites, cultures and sensitivities, fecal leukocytes,  and Clostridium difficile also pending.   IMPRESSION:  1. Crohn's disease now with ongoing failure to thrive.  Question whether     this is all related to his Crohn's disease or if there is some other     process going on.  2. Calf pain; rule out deep vein thrombosis despite chronic Coumadin     therapy.  3. Status post abdominal surgeries  with laparoscopic ventral hernia repair,     status post embolectomy with history of mesenteric ischemia and superior     mesenteric artery embolus, and status post laparoscopic abdominal     evaluation following the embolectomy which proved that bowel was viable.  4. Polymyalgia rheumatica.  5. History of coronary artery disease with coronary artery bypass grafting     in 1994.  The patient does not have many symptoms suggestive of unstable     angina or active angina or any active coronary disease at the present     time.  6. Status post laparoscopic cholecystectomy.  7. Hypercholesterolemia.  With his muscle pain, we need to consider another     possible cause could be his lipid-lowering medication, although he does     not have generalized myalgias.   PLAN:  1. The patient is admitted to the hospital and will be started on     intravenous fluids, intravenous Solu-Medrol, and intravenous antibiotics,     given that he has fairly significant ongoing fevers.  2. We will plan to get Doppler studies of the lower extremities.  3. We will continue a low-fiber diet.  4. We will order CPK to check for myositis.     Azucena Freed, P.A.-C  [facility]                [name]. Deatra Ina, M.D. [facility]    SG/MEDQ  D:  11/14/2002  T:  11/14/2002  Job:  [id number]

## 2010-06-18 NOTE — H&P (Signed)
NAME:  [name], [name]                          ACCOUNT NO.:  [id number]   MEDICAL RECORD NO.:  [id number]                   PATIENT TYPE:  INP   LOCATION:  5150                                 FACILITY:  [facility]   PHYSICIAN:  [name]. Deatra Ina, M.D. [facility]          DATE OF BIRTH:  06/16/1931   DATE OF ADMISSION:  09/25/2002  DATE OF DISCHARGE:                                HISTORY & PHYSICAL   CHIEF COMPLAINT:  Diarrhea and weakness.   HISTORY OF PRESENT ILLNESS:  [name] is a noncomplaining 77 year old white  male who has had diarrhea since May of 2004.  He has a history of acute  mesenteric ischemia and underwent an embolectomy of superior mesenteric  artery embolus in January of 2003.  Within a couple of days after the  embolectomy, an exploratory laparoscopic procedure was performed and the  bowel was viable at that time so he did not require any bowel resection.  The patient takes chronic Coumadin.  He also is on chronic low to moderate  dose prednisone for treatment of polymyalgia rheumatica.  Gallbladder has  been out since 2002.   Since May, Dr. Deatra Ina has been working up the diarrhea.  Studies have  included a CT scan of the abdomen and pelvis July 10, 2002, showing edema in  the left abdomen, questionable ischemia versus inflammation in the jejunum.  The SMA and celiac arteries were patent.  Prostate gland incidentally had  calcification and there was a left groin lipoma noted as well.  On August 29, 2002, the patient underwent a colonoscopy to the cecum.  No visualization of  the terminal ileum was performed.  Polyps were removed from the cecum,  descending and sigmoid colon and these were tubular adenomas on the biopsy.  Diverticulosis in the left colon was also noted.  On September 23, 2002, he  underwent small-bowel follow-through that showed some narrowing and mucosal  effacement in the distal and terminal ileum consistent with Crohn's but also  consideration for diagnoses  including infection and inflammatory process.  Right upper calcifications were noted that they thought might be gallstones  but again his gallbladder is out.  Transit time through small-bowel was  rapid; in fact, barium was seen in the colon after only 10 minutes.   The patient has had treatment with Cipro and Flagyl.  The antibiotics were  stopped when he developed oral ulcers.  However, he has had oral ulcers  since that time so this may be part of an IBD process rather than secondary  to medication.  He has also had Questran prescribed but is no longer taking  that.  Majik mouthwash has been used.  He was also given some samples of  Protonix on one occasion but he is no longer taking any proton pump  inhibitor therapy, but he does describe some nausea and dry heaves as well  as some pyrosis but no  dysphagia and no known history of oral Candida.   Lab studies have included stool O&P, fecal leukocytes, stool for Giardia and  Cryptosporidium, and stool C. difficile assay, all of which were negative on  September 18, 2002.  Serum testing from September 17, 2002, revealed a hemoglobin  of 14.3, white blood cell count of 13.1, adequate platelets at 323,000.  Glucose was notably elevated to 237 with normal BUN and creatinine.  Liver  tests were normal.  Albumin is reduced.   The patient has had anorexia for quite some time.  His wife reports that his  weight has come down from 218 pounds about a year ago to 189 pounds  recently.   The diarrhea is watery in nature, nonbloody.  It can occur from four to five  times a day and sometimes eight to 10 times a day.  It does occur at night.  It is not associated with anything other than some mild abdominal cramping,  certainly no intense abdominal pain.  He does have some upper GI symptoms as  described above.  He has never been upper endoscoped.   ALLERGIES:  No known drug allergies.   CURRENT MEDICATIONS:  1. Coumadin 7.5 mg on Sundays and 5 mg on  Monday through Saturday.  2. Toprol XL 25 mg p.o. daily.  3. Altace 5 mg p.o. daily.  4. Zocor 20 mg p.o. daily.  5. Prednisone 8 mg p.o. daily.   PHYSICIANS:  1. Dola Argyle, M.D.  2. [facility] M. Lenna Gilford, M.D.  3. Jeneen Rinks D. Kellie Simmering, M.D.  4. Renelda Loma M. [name], M.D.   PAST MEDICAL HISTORY:  1. Status post laparoscopic repair of ventral incisional hernia with mesh     November 2003.  2. History of acute mesenteric ischemia and SMA embolus, status post     embolectomy January of 2003.  3. Status post laparoscopic abdominal evaluation January of 2003 following     the embolectomy showing viable bowel.  4. Chronic Coumadin therapy.  5. Polymyalgia rheumatica on chronic prednisone.  6. Coronary artery disease, status post CABG in 1994.  7. Hypertension.  8. Ejection fraction 45% by TEE in January 2003.  9. Status post laparoscopic cholecystectomy 2002.  10.      Hypercholesterolemia.   SOCIAL HISTORY:  The patient is married.  He has three children.  He lives  with his wife in [place].  He rarely drinks beer.  He quit  tobacco in the 1970s.  He is retired from occupation of heavy Conservator, museum/gallery.   FAMILY HISTORY:  This includes CAD, MI, myasthenia gravis, cancers in two  brothers but the type is not known.   REVIEW OF SYSTEMS:  NEUROLOGIC:  Has had some blurry vision in the last  several weeks.  No focal weakness, no history of strokes but is generally  very weak and tired and that has been going on for several months.  ID:  Reports fevers to up to 102 degrees at times over the last three months.  He  has sweats as well as chills and can wake up at night soaked in sweat.  ENT:  Having the oral ulcers as described above.  He is using some sort of a blue  solution on the mouth and tongue to relieve this.  I wonder if it is gentian  blue.  HEMATOLOGIC:  No unusual bleeding or bruising.  DERMATOLOGIC:  No rashes or sores.  Cuts seem to heal in an appropriate amount  of  time.  MUSCULOSKELETAL:  Denies sacral or ileal pain but does have pain and  stiffness of the wrists, knees and ankles.  ENDOCRINE:  Denies polydipsia or  polyuria or oliguria.  Denies prior history of diabetes mellitus or elevated  blood sugars.  PULMONARY:  No shortness of breath or cough.  CARDIOVASCULAR:  No chest pain, no palpitations but does have pedal/ankle edema with some  frequency.   PHYSICAL EXAMINATION:  GENERAL APPEARANCE:  The patient is a chronically ill-  appearing white male in no acute distress.  He looks tired.  VITAL SIGNS:  Temperature 97.2, temperature was 100.4 in the [place] office  earlier today.  pulse 61, respiratory rate 20, blood pressure 103/69.  Weight is 191 pounds, room air saturation 96%.  HEENT:  Sclerae are nonicteric.  Conjunctivae are pink.  Extraocular  movements are intact.  Tongue is discolored, blue.  There is an aphthous  ulcer visible on the tip of the tongue.  Oral mucosa is moist.  No evidence  for Candida.  Glossitis is present.  NECK:  There is no thyromegaly, masses or bruits.  CHEST:  Clear to auscultation and percussion bilaterally.  No cough or  obvious dyspnea.  CARDIOVASCULAR:  There is regular rate and rhythm, no murmurs, rubs, or  gallops.  ABDOMEN:  Soft, nondistended, nontender.  There are active bowel sounds.  No  bruits.  No hepatosplenomegaly.  EXTREMITIES:  No edema appreciated.  Dorsal pedis pulse 3+ bilaterally.  Capillary refill to the toes is brisk.  NEUROLOGIC:  No tremor.  No gross weakness.  Grip and pedal strength are  5/5.  PSYCHIATRIC:  Affect is a bit flat but there is no emotional lability  present.  DERMATOLOGIC:  Do not see any rashes or sores.  This includes examination of  the lower extremities, trunk and upper extremity and head.  HEMATOLOGIC:  No petechiae.  No hematomas.  No cervical or groin adenopathy.   LABORATORY DATA:  Pending at this time are CBC, CMET, sed rate, PT, INR, and  urinalysis with  C&S as well as a hemoglobin A1C.  Labs from the 18th were as  above.  Specifically, the chemistries showed a white blood cell count of  13.1, hemoglobin 14.3, MCV 81.9, platelets 323,000.  Sed rate was 36.  Glucose 237, BUN 9, creatinine 1.0, albumin 2.6.  Total bilirubin, alkaline  phosphatase, AST and ALT all within normal limits.   IMPRESSION:  1. Inflammation involving the distal/terminal ileum associated with     diarrhea.  Symptoms and imaging studies strongly suggestive of Crohn's     disease.  Has failed to respond to appropriate antibiotic measures which     would treat any bacterial infection which may have gone undetected by the     labs.  2. Weakness secondary to the diarrhea.  Note that there was not any anemia     on recently obtained labs.  3. Aphthous ulcers involving the oral mucosa consistent with Crohn's     disease.  4. History of SMA embolus and mesenteric ischemia, status post embolectomy. 5. Status post exploratory laparoscopy following the embolectomy with viable     bowel seen at that time.  6. New onset diabetes mellitus, suspect steroid mediated.  7. Coronary artery disease, status post coronary artery bypass grafting.  No     symptoms suggestive of unstable coronary artery disease or congestive     heart failure.  8. Status post cholecystectomy.  9. Chronic prednisone for polymyalgia rheumatica.  10.      Chronic Coumadin for history of peripheral vascular disease.   PLAN:  The patient is admitted by Dr. Deatra Ina from the office today for IV  fluid hydration, IV prednisone, and initiation of Pentasa therapy.  Will  plan to continue all of his outpatient medications with the exception of  Altace which we will hold for the time being.  Will plan to continue  Coumadin at current doses unless the PT/INR come back either too high or too  low.  Next, will initiate Accu-checks a.c. and h.s. and initiate p.r.n.  sliding scale insulin.  Also will reinitiate oral  Protonix.      Azucena Freed, P.A. [facility]                   Robert D. Deatra Ina, M.D. [facility]    SG/MEDQ  D:  09/25/2002  T:  09/25/2002  Job:  [id number]   cc:   Dola Argyle, M.D.   Deborra Medina. Lenna Gilford, M.D. [facility]   Jeneen Rinks D. Kellie Simmering, M.D.  [address]  [place]  [place] [address]  Fax: [phone]   [name]. [name], M.D.  [address], [address] [place]  [place] [address]  Fax: [phone]

## 2010-06-18 NOTE — Discharge Summary (Signed)
NAME:  [name], [name]                          ACCOUNT NO.:  [id number]   MEDICAL RECORD NO.:  [id number]                   PATIENT TYPE:  INP   LOCATION:  0380                                 FACILITY:  [facility]   PHYSICIAN:  Docia Chuck. Henrene Pastor, M.D. [facility]             DATE OF BIRTH:  Jul 16, 1931   DATE OF ADMISSION:  11/14/2002  DATE OF DISCHARGE:  11/19/2002                                 DISCHARGE SUMMARY   ADMITTING DIAGNOSES:  72. A 77 year old male with small bowel Crohn's now with failure to thrive     with weight loss, anorexia, and progressive diarrhea most consistent with     Crohn's exacerbation, rule out other infectious or inflammatory process.  2. History of polymyalgia rheumatica.  3. History of coronary artery disease status post coronary artery bypass     graft in 1994.  4. Status post laparoscopic cholecystectomy.  5. Hyperlipidemia.  6. Recent left calf discomfort and left lower extremity edema, rule out deep     venous thrombosis versus inflammatory process.   DISCHARGE DIAGNOSES:  1. Improving exacerbation of Crohn's ileitis.  2. Probable Crohn's arthropathy of the left lower extremity with no evidence     of deep venous thrombosis, gout, or pseudo gout.  3. Polymyalgia rheumatica.  4. History of coronary artery disease status post coronary artery bypass     graft in 1994.  5. Status post laparoscopic cholecystectomy.  6. Hyperlipidemia.  7. Recent left calf discomfort and left lower extremity edema, rule out deep     venous thrombosis versus inflammatory process.  8. Hyperglycemia with normal hemoglobin A1C probably secondary to steroids.   CONSULTS:  Anson Oregon, M.D., rheumatology.   PROCEDURE:  None.   BRIEF HISTORY:  [name] is a pleasant 77 year old male known to [name] Baltimore D.  Deatra [name], M.D. [facility] with history of diarrhea onset May of 2004 which has been  evaluated as an outpatient with CT scan of the abdomen, colonoscopy, and  small bowel follow through.   Both CT scan and small bowel follow through  were most consistent with Crohn's in the distal ileum and ileum.  He was  briefly admitted in August of 2004 for initiation of therapy and was started  on prednisone and Pentasa.  He has been on chronic low dose prednisone at 8  mg per day for his polymyalgia and this was increased to 40 mg per day and  then tapered rather quickly.  He began feeling poorly with prednisone taper  and increased his dose back up to 10 mg a day with no improvement in his  symptoms.  He has had some progression in diarrhea with three to four loose  bowel movements per day, fevers intermittently at home to 102-103 associated  with diaphoresis, chills, and at times rigors, according to his wife.  He  apparently has felt terrible, has been very inactive due to profound  fatigue, and also complaining of discomfort in his calf and edema in his  lower extremities.  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  [name], M.D. at that time.  Has been maintained on chronic Coumadin since.  He is at this time admitted to the hospital with probable exacerbation of  his Crohn's for supportive management and further diagnostic evaluation.   LABORATORIES:  On October 15 stool for Giardia negative.  Stool culture  negative.  Stool for C. difficile negative.  Urine culture negative.  On  admission WBC 15.4, hemoglobin 12.9, hematocrit 37.8, MCV 79, platelets  431,000.  Follow-up on October 17 WBC 15, hemoglobin 12.4, hematocrit 37.1.  Sedimentation rate 58.  Pro time on admission 38.4, INR 7.2.  This did drift  on October 18 pro time 25.7 and INR 3.4.  Electrolytes on admission showed  sodium 128, potassium 4.2, glucose 145, BUN 9, creatinine 0.9, albumin 2.4.  Liver function studies normal.  Uric acid level was ordered and was 3.5.  Hemoglobin A1C 7.1, slightly elevated.  CK was 10, CK-MB 1.6.  TSH  0.83.  Iron 43, TIBC 150, iron saturation 29, ferritin 1034.  Urinalysis showed 3-6  wbc's and small amount of leukocyte esterase.   HOSPITAL COURSE:  The patient was admitted to the service of [name].  Deatra [name], M.D. [name] and then cared for by [name], M.D. [name] who was  covering the hospital.  He was initially placed on a low residue diet.  Stool cultures were obtained and he was covered with IV Cipro and IV Flagyl.  He had Doppler of his left lower extremity to rule out DVT and this was  negative.  He was continued on his Pentasa and started on Solu-Medrol at 40  mg IV daily in addition to his other home medications.  He did have some  hyperglycemia during this hospitalization with blood sugars in the 200 range  and was covered with sliding scale insulin.  He and his wife report that he  has never had any elevated blood sugars and, in fact, did not have any  problems in August while he was on steroids.  He was over anticoagulated at  the time of admission.  His Coumadin was held and his INR eventually  drifted.  All of his stool examinations were negative and symptomatically he  gradually improved with IV Solu-Medrol.  His diarrhea improved significantly  and at the time of discharge he was having one to two soft bowel movements  per day without any abdominal cramping or bleeding.  He did not have any  fevers after admission.  He did continue to complain of lower extremity  edema, particularly in his left leg and some discomfort with walking that  did not noticeably improve over the first few days with IV steroids.  We did  consult Anson Oregon, M.D.  He ordered uric acid level and plain films  of his ankle which were unremarkable.  The plain films just showed soft  tissue edema.  He was actually doing better by October 19, able to walk more  comfortably and have some slight decrease in edema.  Anson Oregon, M.D. felt that this was probably a Crohn's related arthropathy.   His appetite was  also improving at the time of discharge.  He had had difficulty with  aphthous ulcers in his mouth which were also resolving.  He  was discharged  on October 19 in improved and stable condition with plans to follow up with  [name]. Deatra [name], M.D. [facility] on October 29.  He was to call for any problems  in the interim.   DISCHARGE MEDICATIONS:  1. Prednisone 40 mg p.o. daily.  Will plan to give him a longer course of     higher dose steroids at this time as he relapsed quickly with rapid taper     previously.  Would plan three to four weeks of 40 mg and then a slow     taper thereafter.  2. Pentasa 250 mg three tablets p.o. q.i.d.  3. Flagyl 250 t.i.d. for three weeks.  4. Altace 5 mg q.a.m.  5. Zocor 20 q.a.m.  6. Protonix 40 q.a.m.  7. Coumadin to be resumed at 5 mg p.o. daily today.  8. Mycelex troches t.i.d. for one more week.    DIET:  Low residue, carbohydrate modified.  We will need to follow up  laboratories and follow up glucose as an outpatient.  We have also discussed  initiating 6MP and this was discussed with he and his wife.  We will defer  this to [name]. Deatra [name], M.D. [facility] on return office visit.     Amy Esterwood, P.A.-C. [place]. Henrene Pastor, M.D. [facility]    AE/MEDQ  D:  11/19/2002  T:  11/19/2002  Job:  [id number]

## 2010-06-18 NOTE — Op Note (Signed)
[name], [name]                   ACCOUNT NO.:  [id number]   MEDICAL RECORD NO.:  [id number]          PATIENT TYPE:  OIB   LOCATION:  2852                         FACILITY:  [facility]   PHYSICIAN:  Champ Mungo. Lovena Le, M.D.  DATE OF BIRTH:  11/05/1931   DATE OF PROCEDURE:  10/11/2004  DATE OF DISCHARGE:                                 OPERATIVE REPORT   PROCEDURE PERFORMED:  Left ventricular lead revision with defibrillation and  device testing.   INTRODUCTION:  The patient is a 77 year old male with a history of an  ischemic cardiomyopathy, ejection fraction of 15% with left bundle branch  block. He is enrolled in the CRT study which randomizes patients to receive  single chamber defibrillator or bi-V ICD who have class one and two heart  failure. This was carried out several weeks ago.  The patient developed left  ventricular lead dislodgement. This initially dislodged in the coronary  sinus and subsequently into the right ventricle. He is now referred for LV  lead revision.   PROCEDURE:  After informed consent was obtained, the patient is taken to the  diagnostic EP lab in fasting state. After usual preparation and draping,  intravenous fentanyl and Midazolam was given for sedation. 30 mL of  lidocaine was infiltrated into the left infraclavicular region. A 9 cm  incision was carried out over the old pacemaker/ICD insertion site.  Electrocautery was utilized to dissect down to the fascial plane and into  the pocket.  Interrogation of the LV lead demonstrated a kinking in the lead  and attempts to pass a stylet into the LV lead so that access could be  gained without venous puncture were unsuccessful. It appeared that the LV  lead body had collapsed allowing passage of a stylet to not be possible. At  this point the left subclavian vein was punctured and the J-wire advanced  into the vein and a 9.5 [place] hemostatic sheath placed there as well. The  Guidant guiding catheter and a  6-French hexapolar EP catheter were then  placed into the right atrium. Coronary sinus cannulation was carried out  without particular difficulty. Venography of coronary sinus was then carried  out. The old LV lead was removed. Having accomplished this without  particular difficulty, the posterolateral vein was chosen for LV placement.  Of note, a previously the Guidant unipolar lead had been utilized and tried  to pace in the posterolateral vein but this could not be accomplished  secondary to high threshold. For this reason, the St. Jude bipolar LV pacing  lead was advanced into the posterolateral vein. Near the LV lateral apical  wall the patient's diaphragm was stimulated at approximately 3 volts and  higher. This is despite a pacing threshold 1 volt. The lead was withdrawn  back onto the posterolateral region approximately two thirds from base to  apex. Of note, this was in the same RAO a distance from base to apex as was  the RV lead.  In this location, the pacing threshold was 1.2 volts at 0.5  milliseconds with a pacing impedance of  744 ohms.  10 V pacing did not  stimulate the diaphragm in this location. The LV waves measured between  three and 5 mV. With these have satisfactory parameters, the LV  defibrillator and right atrial leads were secured to the subpectoralis  fascia with a figure-of-eight silk suture.  The sewing sleeve was also  secured with silk suture. Kanamycin irrigation was utilized to irrigate the  pocket. Electrocautery utilized to assure hemostasis. The previously  implanted Guidant bi-V ICD was reconnected to the LV RV and right atrial  leads and placed in the subcutaneous pocket. Additional kanamycin was  utilized to irrigate the pocket and defibrillation threshold testing carried  out.   After the patient was more deeply sedated with fentanyl and Versed, VF was  induced with T-wave shock. A 14 joules shock was subsequently delivered  which terminated  ventricular fibrillation and restored sinus rhythm. Having  had previous successful DFT test and demonstrating satisfactory sensing  parameters, the incision was closed with layer of 2-0 Vicryl followed by  layer of 3-0 Vicryl followed by layer of 4-0 Vicryl.  Benzoin was painted on  the skin, Steri-Strips were applied and a pressure dressing was placed. The  patient was allowed to awaken and returned to his room in satisfactory  condition.   COMPLICATIONS:  There were no immediate procedure complications.   RESULTS:  Demonstrate successful bi-V lead revision in a patient with  previous left ventricular lead dislodgement.           ______________________________  Champ Mungo. Lovena Le, M.D.     GWT/MEDQ  D:  10/11/2004  T:  10/11/2004  Job:  [id number]   cc:   Dola Argyle, M.D.  [address] [place] [id number]   Scott M. Lenna Gilford, M.D. [facility]  [address]  [place] [address]

## 2010-06-18 NOTE — Discharge Summary (Signed)
NAME:  [name], [name]                          ACCOUNT NO.:  [id number]   MEDICAL RECORD NO.:  [id number]                   [name] TYPE:  INP   LOCATION:  5150                                 FACILITY:  [facility]   PHYSICIAN:  Docia Chuck. Henrene Pastor, M.D. [facility]             DATE OF BIRTH:  1931/09/26   DATE OF ADMISSION:  09/25/2002  DATE OF DISCHARGE:  09/27/2002                                 DISCHARGE SUMMARY   ADMISSION DIAGNOSES:  1. Several month history of diarrhea.  Recent small bowel follow through     confirming inflammation in [name] distal ilium consistent with Crohn's     disease.  2. Failure to thrive and weakness secondary to ongoing diarrhea.  [name]     [name] is not anemic.  3. Aphthous ulcers of [name] oral mucosa probably secondary to Crohn's disease.  4. 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     Status post embolectomy, in January of 2003.  Status post laparoscopic     abdominal evaluation in January of 2003 showing viable bowel.  5. Status post laparoscopic repair of ventral incisional hernia.  Mesh     placed in November of 2003.  6. Chronic Coumadin therapy.  I suspect [name] reason for this is history of     embolus.  7. Polymyalgia rheumatica treated with chronic prednisone.  8. Coronary artery disease, status post coronary artery bypass graft in     1994.  9. Hypertension.  10.      Ejection fraction 45% by transesophageal echocardiogram in January     of 2003.  11.      Laparoscopic cholecystectomy in 2002.  12.      Hypercholesterolemia.  70.      New onset diabetes mellitus, suspect secondary to steroid therapy.   DISCHARGE DIAGNOSES:  1. Probable Crohn's disease involving [name] distal and terminal ilium.  2. Diarrhea secondary to Crohn's disease, much improved following initiation     of intravenous steroids and p.o. Pentasa during this admission.  3. Weakness and failure to thrive with anorexia also improved with     initiation of  therapy.  4. Leukocytosis owning to inflammatory process, as well as chronic steroids.  5. Gastrointestinal reflux disease.  6. Chronic Coumadin therapy.  7. Polymyalgia rheumatica.  8. Diabetes mellitus, type 2.  Note that [name] hemoglobin A1C is elevated as     well.   PROCEDURES:  None.   CONSULTATIONS:  None.   BRIEF HISTORY:  [name] is a 77 year old white male with diarrhea since  May of 2004.  As noted above, he has had to have embolectomy of embolus in  [name] superior mesenteric artery, as well as an exploratory laparoscopy  performed to evaluate [name] viability of his bowel, all performed within a  couple of days of each other in January of 2003.  He had done well since  that  time, but beginning in [name] springtime of this year in around late April  or May, he developed refractory diarrhea with stools four to five times a  day, sometimes up as often as 10 times a day, with watery nonbloody stool.  Workups by Dr. Deatra Ina, as his primary care physician, included CT scan of  [name] abdomen showing some edema in [name] left abdomen, question ischemia versus  inflammation in [name] jejunum.  Importantly, [name] SMA and celiac arteries were  patent with no evidence for ischemia in [name] vasculature.  On August 29, 2002,  he had a colonoscopy and removal of polyps from [name] cecum, descending, and  sigmoid colon.  These were all tubular adenomas with no high-grade dysplasia  or malignancy on [name] pathology.  Dr. Deatra Ina was unable to intubate into [name]  terminal ilium.  Diverticulosis in [name] left colon was incidentally noticed.  On September 23, 2002, a small bowel follow through showed narrowing and  mucosal effacement in [name] distal and terminal ileum consistent with Crohn's,  although they also proposed alternate diagnosis of infection versus  inflammatory process.  Outpatient testing included multiple stool tests for  various pathogens, including Clostridium difficile, and were all negative.  Hemoglobin was  preserved at 14.3 and white blood cell count was 13,100.  He  did have an elevated glucose on testing on September 17, 2002.  Treatment had  been Cipro and little bit later some oral Flagyl, but he developed oral  ulcers, so [name] antibiotics were stopped and they really were not helping his  diarrhea in any event.  Questran had been prescribed along with Protonix.   [name] was seen in [name] office on September 25, 2002, and was looking very  weak and feeling profoundly fatigued.  He had not been eating.  He had lost  almost 30 pounds over [name] last several months since [name] symptoms had started  several months ago.  Dr. Deatra Ina elected to admit [name] for initiation  of IV steroids and oral mesalamine preparation to get [name]'s Crohn's  disease under rapid control.  [name] does take Coumadin and has not had  any problems with INR regulation.   LABORATORY DATA:  [name] sedimentation rate was 71.  Hemoglobin A1C elevated at  6.7.  Urinalysis negative, including microscopic views showing insignificant  amount of red blood cells and no white blood count or bacteria present.  [name]  white blood cell count was 15.3, [name] hemoglobin was 12.8, hematocrit 38.2,  MCV 80, and platelets 318.  PT 25.7, INR 3.4.  Sodium 137, potassium 3.7,  glucose 138, BUN 6, creatinine 0.9.  Total bilirubin 0.6, alkaline  phosphatase 82, AST 19, ALT 19.  Total protein 5.5 and albumin 2.4, both of  which are low.  CT scan of [name] abdomen and pelvis with oral and IV contrast  confirmed inflammatory process in [name] small bowel at [name] left mid abdomen  most consistent with Crohn's.  This was evidenced by wall thickening and  peri-intestinal inflammation in [name] involved area, but no abscesses or  evidence for perforation.   HOSPITAL COURSE:  [name] was admitted [name] afternoon of September 25, 2002.  By [name] following day, despite having had to drink oral CT contrast, [name] had had few bowel movements than was his  recent normal pattern.  He  was initially placed on a diet of clear liquids, but on hospital day #2 was  allowed full liquids.   [name] states that  initially early on in [name] onset of his diarrhea in  May, he had had some difficulty with mid abdominal pain, but that had  resolved and he had had no further trouble with abdominal pain.  Certainly  he did not have any abdominal pain or tenderness on exam during this  hospitalization.   On hospital day #3, he continued to have improvement in number and quality  of stools.  He was having some formed elements with his stools and had only  had two or three bowel movements within [name] last 24 hours of his admission.  [name] white blood cell count remained elevated, but this was not unexpected  that he takes a chronic low dose of steroids and had been treated with 40 mg  of IV Solu-Medrol once he was admitted.  He tolerated both [name] Solu-Medrol  and [name] Pentasa well.  No nausea or vomiting.  In fact, his anorexia made  some improvements and [name] morning that he was discharged he was actually  hungry, which is somewhat that he has not been for quite some time.   CT scan basically confirmed information that had been noted on [name] small  bowel follow through.  We did check a hemoglobin A1C because [name] blood sugar  at [name] office a week or so prior to admission had been in [name] 230 range and  it did come back somewhat elevated.  We did not elect to initiate any oral  agents during this admission.  He did have blood sugars on CBGs as high as  273 and he was covered with sliding scale insulin.   [name] and his wife were advised to contact Dr. Lenna Gilford to get an  appointment sometime next week with either Dr. Lenna Gilford or Rexene Edison, P.A.,  Dr. Jeannine Kitten nurse practitioner, in order to have his blood sugars rechecked  and initiate appropriate therapy if necessary.  [name] had an  outpatient office visit arranged to see Dr. Deatra Ina on October 09, 2002,  at  1:30 p.m.   DISPOSITION:  [name] was discharged to home in stable condition [name]  morning of September 27, 2002.   DIET:  Diet was to be low fiber and he received some printed out  instructions and guidelines as to how to follow this.  He is also to stick  to a low-carbohydrate diet as well given his elevated blood sugars.   DISCHARGE MEDICATIONS:  New medications are:  1. Pentasa 250 mg four pills four times daily.  2. Prednisone 40 mg daily for 14 days, then 30 mg daily for 14 days, and     further reduction in dosing will be initiated by Dr. Deatra Ina at [name] next     office visit.  3. Coumadin 7.5 mg on Sunday and 5 mg p.o. daily Monday through Saturday.  4. Toprol XL 25 mg daily.  5. Altace 5 mg daily.  6. Zocor 20 mg daily.      Azucena Freed, P.A. [facility]                   John N. Henrene Pastor, M.D. [facility]    SG/MEDQ  D:  09/27/2002  T:  09/28/2002  Job:  [id number]   cc:   Deborra Medina. Lenna Gilford, M.D. [facility]

## 2010-06-18 NOTE — Op Note (Signed)
[place]  Patient:    [name], [name] Visit Number: [id number] MRN: [id number]          Service Type: SUR Location: 9030 0923 01 Attending Physician:  Derry Lory Dictated by:   [name]. [name], M.D. Proc. Date: 02/07/01 Admit Date:  02/05/2001   CC:         Deborra Medina. Lenna Gilford, M.D. [facility]  Jeneen Rinks D. Kellie Simmering, M.D.  Carlena Bjornstad, M.D. 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  PROCEDURES: 1. Planned second look laparotomy to assess bowel ischemia. 2. Doppler examination of small bowel mesentery.  SURGEON:  [name]. [name], M.D.  FIRST ASSISTANT:  Ward Givens, M.D.  ESTIMATED BLOOD LOSS:  About 100 cc.  COMPLICATIONS:  None.  OPERATIVE INDICATION:  This is a 77 year old white man admitted to the hospital on January 3 following a six-day history of nausea, vomiting, diarrhea, and diffuse abdominal pain.  Initially he was thought to have a small bowel obstruction.  Subsequent CT scan showed a clot in the superior mesenteric artery and some mesenteric edema.  Arteriogram showed what appeared to be a superior mesenteric artery embolus.  Two days ago he underwent exploratory laparotomy and thrombectomy of the superior mesenteric artery embolus with good restoration of flow.  His bowel appeared to be viable at that time, but there were a couple of areas that were hemorrhagic.  Because of the nature of his problem, second look laparotomy was planned at that time. He has done well over the past 48 hours.  OPERATIVE FINDINGS:  The intestinal tract was viable, and there was no evidence of ischemia.  The small bowel was examined from the ligament of Treitz all the way to the ileocecal valve, and the entire colon was examined. Everything appeared  pink and viable and had good peristalsis.  Doppler mesenteric exam was performed of the small bowel mesentery and in the proximal, mid-, and distal small bowel there was good biphasic arterial flow proximally and out in the distal mesenteric arcades.  There was some watery bloody fluid throughout the abdomen, which was removed.  The stomach looked healthy.  DESCRIPTION OF PROCEDURE:  Following the induction of general endotracheal anesthesia, the patients abdomen was prepped and draped in a sterile fashion. The skin staples were removed.  The sutures in the midline fascia were removed.  The abdomen was entered and explored with findings as described above.  We irrigated out the abdomen with warm saline, got all the bloody fluid out, found that there was no active bleeding anywhere.  We examined the bowel thoroughly from the ligament of Treitz all the way to the rectum and found no evidence of any ischemia grossly.  Doppler exam of the small bowel mesentery as described above showed good biphasic flow in the proximal and the distal small bowel mesenteric vessels.  There was moderate edema of the mid-ileum, but this appeared to be improved compared to 48 hours ago.  The small bowel and transverse colon and omentum were returned to their anatomic positions.  The midline fascia was closed with running suture of #1 Novofil and about six interrupted sutures of #1 Novofil for reinforcement. The skin was closed with skin staples.  Clean bandages were placed.  The patient was taken to the recovery room in stable condition.  Sponge, needle, and instrument  counts were correct.  A transesophageal echocardiogram was performed by Dr. Linna Caprice during the case. Dictated by:   [name]. [name], M.D. Attending Physician:  Derry Lory DD:  02/07/01 TD:  02/08/01 Job: [id number]

## 2010-06-18 NOTE — Discharge Summary (Signed)
[name], [name]                   ACCOUNT NO.:  [id number]   MEDICAL RECORD NO.:  [id number]          PATIENT TYPE:  OIB   LOCATION:  1749                         FACILITY:  [facility]   PHYSICIAN:  Champ Mungo. Lovena Le, M.D.  DATE OF BIRTH:  [birth year]   DATE OF ADMISSION:  10/11/2004  DATE OF DISCHARGE:  10/12/2004                                 DISCHARGE SUMMARY   ADDENDUM:  [name] has ongoing ventral hernias.  These are at surgical  sites most notably the site of the right colostomy, although they are not  particularly hurtful to the patient, he does wear an abdominal binder which  does help a little bit with his presentation.  He has seen Dr. [name] for  this in the past and this is just an addendum to mention that perhaps maybe  a re-evaluation by general surgery would be in order sometime in the future.      Sueanne Margarita, P.A.    ______________________________  Champ Mungo. Lovena Le, M.D.    GM/MEDQ  D:  10/12/2004  T:  10/12/2004  Job:  [id number]   cc:   Dola Argyle, M.D.  [address] [place] [address]   Scott M. Lenna Gilford, M.D. [facility]  [address]  [place] [address]

## 2010-06-18 NOTE — H&P (Signed)
[facility]  Patient:    [name], [name] Visit Number: [id number] MRN: [id number]          Service Type: Attending:  Deborra Medina. Lenna Gilford, M.D. [facility] Dictated by:   Tammy Parrett, N.P.                           History and Physical  DATE OF BIRTH:  August 29, 1931  CHIEF COMPLAINT:  Nausea, vomiting, and diarrhea x 1 week.  HISTORY OF PRESENT ILLNESS:  [name] is a 77 year old white male, patient of Dr. Lenna Gilford, who presented for an acute work-in visit today complaining of abdominal cramping, nausea, vomiting, and diarrhea that began right after Christmas Day.  Vomiting has subsided over the last three to four days; however, he continues to have loose watery stools.  However, these have decreased in frequency, only has had two over the last 24 hours.  He denies any fever, blood or mucous in stools, urinary symptoms, chest pain, shortness of breath, leg swelling, dizziness, or syncope.  Does feel quite weak and has abdominal bloating, distention, and cramping.  Has been using some Imodium over-the-counter without any relief.  Several of his family members have had similar symptoms; however, they have all recovered without any further sequelae or medical treatment.  Last normal bowel movement was five days ago. The patient denies any recent travel or illness.  The patient has not been seen by Dr. Lenna Gilford in over four years.  His last visit was an acute work-in visit this past February for an acute gallbladder, in which he underwent a cholecystectomy by Dr. [name].  The patient is followed by Dr. [name] for a history of coronary artery disease.  The patient was sent to [facility] for a flat and upright abdominal x-ray which looked as if he had a questionable small-bowel obstruction.  The patient will need to be admitted to the hospital for IV fluids and bowel rest.  PAST MEDICAL HISTORY: 1. Coronary artery disease, status post CABG in 1994, followed by Dr. [name]. 2.  Hypertension. 3. Hypercholesterolemia. 4. Status post cholecystectomy in February 2002, by Dr. [name].  CURRENT MEDICATIONS: 1. Zocor 40 mg every night. 2. Lotensin 10 mg daily. 3. Lanoxin 0.125 mg daily. 4. Aspirin 81 mg daily.  DRUG ALLERGIES:  No known drug allergies.  SOCIAL HISTORY:  The patient is married.  Has three children.  Is self-employed.  He is a former smoker.  He denies any alcohol or drug use.  FAMILY HISTORY:  Positive for heart disease and questionable cancer in a brother, unsure type.  Otherwise noncontributory.  REVIEW OF SYSTEMS:  Otherwise negative except as noted above.  PHYSICAL EXAMINATION:  GENERAL:  Pleasant ambulatory white male in no acute distress.  VITAL SIGNS:  Temperature 97.6, blood pressure 146/98 sitting, 130/80 standing, pulse regular at 75, respiratory rate 16.  Weight is 208.  HEENT:  PERRLA.  Conjunctivae and sclerae are clear.  Nasal mucosa is normal. Oropharynx is clear.  NECK:  Supple.  Without adenopathy.  No JVD.  Trachea is midline.  Without any nodules or thyromegaly appreciated.  Carotids are equal with positive upstrokes bilaterally.  LUNGS:  Clear to auscultation bilaterally.  CARDIAC:  Regular S1, S2.  Without murmur, rub, or gallop.  ABDOMEN:  Positive bowel sounds in all four quadrants.  Soft, with some generalized tenderness throughout.  No guarding or rebound noted.  No hepatosplenomegaly.  Negative CVA tenderness.  EXTREMITIES:  Warm, without any calf tenderness, cyanosis, clubbing, or edema. Pulses are intact bilaterally.  Moves all extremities well.  SKIN:  Without rash.  NEUROLOGIC:  No focal deficits detected.  LABORATORY DATA:  Flat and upright x-ray revealed some dilated small-bowel loops, with questionable underlying small-bowel obstruction, mechanical obstruction.  IMPRESSION AND PLAN:  Abdominal pain with probable small-bowel obstruction. The patient is to be admitted to [facility] under  Dr. Lenna Gilford.  The patient will begin IV fluids, bowel rest, and laboratories are currently pending. Dictated by:   Tammy Parrett, N.P. Attending:  Deborra Medina. Lenna Gilford, M.D. [facility] DD:  02/01/01 TD:  02/01/01 Job: [id number]

## 2010-06-18 NOTE — Assessment & Plan Note (Signed)
[facility]                           ELECTROPHYSIOLOGY OFFICE NOTE   NAME:[name]                          MRN:          [id number]  DATE:12/13/2005                            DOB:          06-Sep-1931    [name] is a patient implanted for MADIT CRT by Dr. Crissie Sickles, and he is  here today for followup.  He has had no problems with chest pain, and his  breathing is doing quite well.  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  His most  difficulty is getting up onto the tractor.   MEDICATIONS:  His medications are reviewed and include:  1. Coreg 25 b.i.d.  2. Lisinopril 20.  3. Aspirin.   EXAMINATION:  VITAL SIGNS:  His blood pressure is 112/72, pulse of 57.  LUNGS:  Clear.  HEART:  Sounds were regular.  EXTREMITIES:  Without edema.   Interrogation of his Guidant H170 ICD demonstrated a P-wave of 7.4 with  impedence of 534, threshold of 0.8 and 0.5.  The R-wave is greater than 25  with impedance of 712, a threshold of 0.6 and 0.5.  The L-wave was 15 with  impedence of 430, a threshold of 0.8 to 0.8.  Battery voltage is 3.15 and  there are no intercurrent episodes.   His AV delay, I should note, is programmed at 60 msec.   IMPRESSION:  1. Ischemic cardiomyopathy.  2. Congestive heart failure - chronic - systolic.  3. Left bundle branch block.  4. Status post CRT ICD implantation.   [name] is stable.  He will have to follow up as part of the MADIT CRT  protocol with Dr. Lovena Le.     Deboraha Sprang, MD, [facility]  Electronically Signed    SCK/MedQ  DD: 12/13/2005  DT: 12/13/2005  Job #: [id number]

## 2010-06-18 NOTE — Op Note (Signed)
[place]. [facility]  Patient:    [name], [name] Visit Number: [id number] MRN: [id number]          Service Type: SUR Location: RCRM 2550 11 Attending Physician:  Derry Lory Dictated by:   [name]. [name], M.D. Proc. Date: 02/05/01 Admit Date:  02/05/2001   CC:         Deborra Medina. Lenna Gilford, M.D. [facility]  Carlena Bjornstad, M.D. [facility]  Jeneen Rinks D. Kellie Simmering, M.D.   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  OPERATION PERFORMED: 1. Intraoperative consultation to assess bowel viability. 2. Closure of abdominal wound.  SURGEON:  [name]. [name], M.D.  FIRST ASSISTANT:  Nelda Severe. Kellie Simmering, M.D.  OPERATIVE INDICATIONS:  This is a 77 year old white man who was admitted to the hospital on February 02, 2001, with a six day history of abdominal pain, nausea, vomiting, and diarrhea.  He was thought to have a partial small bowel obstruction.  A CT scan was subsequently done which showed a "thrombus" in the superior mesenteric artery and he was started on heparin.  His abdominal pain improved, but his white blood cell count went up to 17,000.  Mesenteric angiogram today showed a superior mesenteric artery embolus.  Dr. Kellie Simmering and I felt that the patient would need to be explored for embolectomy and possible bowel resection should there to be any compromised bowel.  OPERATIVE FINDINGS:  Dr. Kellie Simmering performed superior mesenteric artery embolectomy, and removed the embolus, and restored flow without much trouble. He will be dictating that separately.  There was a little bit of ascites, not much.  The small bowel was clearly viable, but somewhat edematous.  After the embolectomy procedure was performed and small bowel perfusion was restored, there was a one and a one-half foot segment of distal  ileum, approximately one foot proximal to the ileocecal valve that was fairly hemorrhagic, but appeared viable.  There was good mesenteric pulsations in that area.  We felt that this was simply hemorrhagic and viable, and so we elected not to do any bowel resection, but to plan a second look procedure in 48 hours.  The liver had multiple tiny white spots on it.  These really did not look like tumors.  We did not perform biopsy because of the patient was anticoagulated.  The gallbladder was surgically absent.  The stomach looked normal.  Left renal vein was patent.  The right colon, transverse colon, descending colon, sigmoid colon, and rectum were pink and viable.  There was no sign of any infection.  DESCRIPTION OF PROCEDURE:  Following the induction of general endotracheal anesthesia, the abdomen was prepped and draped in a sterile fashion. Dr. Tinnie Gens performed a midline laparotomy and did a superior mesenteric artery embolectomy which was successful.  There is a primary closure of the superior mesenteric artery with good flow being reestablished both in the superior mesenteric artery and in the distal mesenteric arcades of the small bowel.  I was in the operating room during the initial exploration, and I came back to the operating room to assess the remainder of the abdomen and the bowel viability.  I felt that all of the small bowel and colon were viable. My only concern was a one and one-half foot segment of distal ileum which began about one foot proximal to the ileocecal valve and ended about two and one  half feet proximal to the ileocecal valve.  The bowel here was somewhat more hemorrhagic, somewhat more edematous, but did have good mesenteric arcade blood flow.  We elected to not perform any resection at this time.  The small bowel and omentum were returned to their anatomic positions.  The midline fascia was closed by me with a running suture of #1 PDS and the skin  was closed with skin staples.  Clean bandages were placed and the patient taken to the recovery room in stable condition.  Sponge, needle, and instrument counts were correct.  We will plan a second look laparotomy in 48 hours to reassess bowel viability. Dictated by:   [name]. [name], M.D. Attending Physician:  Derry Lory DD:  02/05/01 TD:  02/06/01 Job: [id number]

## 2010-06-18 NOTE — Op Note (Signed)
NAME:  [name], [name]                             ACCOUNT NO.:  [id number]   MEDICAL RECORD NO.:  [id number]                   PATIENT TYPE:  INP   LOCATION:  X007                                 FACILITY:  [facility]   PHYSICIAN:  [name]. [name], M.D.             DATE OF BIRTH:  12-Aug-1931   DATE OF PROCEDURE:  12/10/2001  DATE OF DISCHARGE:                                 OPERATIVE REPORT   PREOPERATIVE DIAGNOSIS:  Ventral incisional hernia.   POSTOPERATIVE DIAGNOSIS:  Incarcerated ventral incisional hernia.   OPERATION PERFORMED:  1. Laparoscopic lysis of adhesions (1 hour and 30 minutes).  2. Laparoscopic repair of ventral hernia with dual mesh.   SURGEON:  [name]. [name], M.D.   FIRST ASSISTANT:  Fenton Malling. Lucia Gaskins, M.D.   OPERATIVE INDICATIONS:  This is a 77 year old white man, who underwent  emergency laparotomy and superior mesenteric artery thrombectomy in January  2003, for suspected cardiac embolic source.  He had a second-look laparotomy  24 hours later, and the bowel was viable, and he never had to have a bowel  resection.  He has been placed on Coumadin and followed by Dola Argyle,  M.D. for his coronary artery disease.  He has developed a ventral incisional  hernia with multiple defects at and above the umbilicus, and this is  beginning to bother him.  He is brought to the operating room electively for  repair of his repair.   OPERATIVE FINDINGS:  The patient had extensive adhesions throughout the  central portion of the abdomen.  It took 1 hour and 30 minutes to take all  the adhesions down laparoscopically before we could begin the hernia repair.  The bowel and omentum looked perfectly healthy and viable.  The liver and  stomach looked fine.  He had multiple hernia defects, starting at the  umbilicus and extending up the midline to below the epigastrium.   OPERATIVE TECHNIQUE:  Following the induction of general endotracheal  anesthesia, the patient had a Foley  catheter inserted.  The abdomen was  prepped and draped in a sterile fashion.  A 10 mm Hasson trocar was inserted  in the left flank through a 2 cm incision.  We traversed the abdominal wall  under direct vision in a muscle-splitting fashion.  We entered the abdominal  cavity bluntly and inserted a 10 mm Hasson trocar and secured this with a  pursestring suture of 0 Vicryl.  Pneumoperitoneum was created.  Video camera  was inserted with visualization and findings as described above.  It took  multiple trocars to take all of his adhesions down.  We had a total of five  trocars on the left side and two trocars on the right.  We spent 1 hour and  30 minutes taking adhesions down, some by sharp dissection and some by blunt  dissection.  We had to move the cameras around multiple  times and change  cameras multiple times to get good visualization so that we could directly  dissect the areas where the omentum and the bowel were adherent to the  abdominal wall.  We did not have any injury to the bowel.   After we took all the adhesions down, we checked for bleeding and found  none.  We then marked the defect with spinal needles and then drew an  elliptical shaped template for the mesh repair with the marking pen.  After  measuring this, we found that it was 28 cm in vertical dimension by 17 cm in  transverse dimension.  We brought a large sheet of dual Gore-Tex mesh to the  operative field which had a rough side and a smooth side.  We marked the  abdominal wall as a template and then used that to cut the mesh into the  proper shape.  We placed eight equidistant sutures of 0 Novofil around the  perimeter of the mesh at the eight equidistant marked places.  We then  rolled the mesh up and inserted it into the abdominal cavity.  The mesh was  then rolled out and positioned.  We had made small puncture wounds in the  abdominal wall at the 8 equidistant sites and brought the Novofil sutures  out using  the Endoclose device.  We tried to get a 1 cm bite of fascia in  all places.  After we had placed all the sutures and drawn them up, we then  pulled the mesh up against the abdominal wall, and we found that it was  positioned nicely without any redundancy or undue tension.  We tied all  eight knots, inspected this, and everything looked fine.  We then used a 5  mm screw tacking device to tack the perimeter of the mesh circumferentially  under direct vision.  We placed the tacks about 1 cm apart.  We placed  several tacks more centrally located to affix the mesh to the abdominal  wall.   After this was done, the mesh repair was complete.  We tried to take  photographs, but they came out too dark, and so we did not have any  photodocumentation.   We checked the bowel and the omentum, found no bleeding or bowel injury.  The trocars were removed under direct vision, and there was no bleeding from  the trocar sites.  Pneumoperitoneum was released.  The fascia in the left  flank was closed with 0 Vicryl suture.  The skin incisions were closed with  skin staples and Steri-Strips.  Clean bandages were placed and the patient  taken to the recovery room in stable condition.  Estimated blood loss was  about 50 cc.  Complications none.  Sponge, needle, and instrument counts  were correct.                                               [name]. [name], M.D.    HMI/MEDQ  D:  12/10/2001  T:  12/10/2001  Job:  [id number]   cc:   Deborra Medina. Lenna Gilford, M.D. [facility]   Dola Argyle, M.D. [facility]   Anson [place], M.D.  [address] [place]  [place] [address]  Fax: [phone]

## 2010-06-18 NOTE — Assessment & Plan Note (Signed)
[facility]                            CARDIOLOGY OFFICE NOTE   NAME:[name] [name] Copa                          MRN:          [id number]  DATE:03/13/2006                            DOB:          July 27, 1931    [name] [name] is doing well.  He tolerates the medications for his left  ventricular dysfunction.  He has an ICD in place and is part of the  mated CRT protocol.  He is not having any chest pain or shortness of  breath.  He has had significant GI surgery.  He has a hernia that  bothers him in his left groin.  He says at times that he does have to  work with moving it out of the hernia sac.  GI doctors in [place] have  recommended that he have this evaluated, and we discussed this today.  From the cardiac viewpoint, I believe that he is stable for this,  assuming that it is appropriate, and we will get expert advice from Dr.  Fanny Skates.   ALLERGIES:  NO KNOWN DRUG ALLERGIES.   MEDICATIONS:  1. Zocor 20.  2. Coumadin as directed.  3. 6-MP daily.  4. Lasix 40.  5. Potassium chloride.  6. Folic acid.  7. Gabapentin.  8. Lisinopril 20.  9. Aspirin 81.   OTHER MEDICAL PROBLEMS:  See the list below.   REVIEW OF SYSTEMS:  Overall he is doing well.  He is quite active.  He  is not having chest pain or shortness of breath.  His major complaint,  as mentioned, is consideration of the hernia at this point.  Otherwise,  his review of systems is negative.   PHYSICAL EXAMINATION:  VITAL SIGNS:  Weight is 206 pounds.  Blood  pressure 116/64, pulse 55.  GENERAL:  The patient is oriented to person, time, and place.  His  affect is normal.  He is here with family.  HEENT:  He has no xanthelasma.  He has normal extraocular motion.  NECK:  There are no carotid bruits.  There is no jugular venous  distension.  LUNGS:  Clear.  Respiratory effort is not labored.  CARDIAC:  S1 and S2.  There are no clicks or significant murmurs.  ABDOMEN:  He is wearing a  support brace for his abdomen.  Bowel sounds  are normal.  EXTREMITIES:  He has no significant peripheral edema.  There are normal  distal pulses.   EKG reveals that his rhythm is paced.   PROBLEMS:  1. Coronary disease, post coronary artery bypass graft in 1994 and      catheterization in 2006 showing an 80% obtuse marginal lesion and a      total right.  Vein graft to the diagonal was occluded.  Other      bypasses were patent.  There was trace mitral regurgitation.  He      did not need a coronary intervention at that time.  Ejection      fraction has improved over time.  In August of 2007, I thought that  his ejection fraction was 35-40%.  This was definitely improved      with him on the appropriate medication.  2. Hypertension, treated.  3. Hyperlipidemia, treated.  4. Status post cholecystectomy.  5. Gastroesophageal reflux disease.  6. History of a mesenteric embolus with surgery in 2003.  7. Long-term Coumadin because of his mesenteric embolus.  8. Crohn's disease for which he has had significant surgery in      [place].  9. Some mild congestive heart failure clinically related to left      ventricular dysfunction, but this now is completely stable.  10.Status post biventricular implantable cardioverter-defibrillator.      He has followed up with Dr. Caryl Comes in November of 2007, and he has      been stable.  11.Left bundle branch block.  12.Left hernia, which I believe is probably inguinal by history.   I will send all of the current information to Dr. Fanny Skates.  [name] would like to have the surgery here in [place] if possible.  From the cardiac viewpoint, I believe that [name] [name] can have surgery if  it appears to be necessary for him.  For now, I have recommended that we  start by having him obtain surgical evaluation, and then I will help him  make the decision.     [name] Bjornstad, MD, [facility]  Electronically Signed    JDK/MedQ  DD: 03/13/2006   DT: 03/13/2006  Job #: [id number]   cc:   [name]. [name], M.D.

## 2010-06-22 ENCOUNTER — Ambulatory Visit (INDEPENDENT_AMBULATORY_CARE_PROVIDER_SITE_OTHER): Payer: Medicare Other | Admitting: *Deleted

## 2010-06-22 DIAGNOSIS — K55059 Acute (reversible) ischemia of intestine, part and extent unspecified: Secondary | ICD-10-CM

## 2010-06-22 DIAGNOSIS — K55069 Acute infarction of intestine, part and extent unspecified: Secondary | ICD-10-CM

## 2010-07-20 ENCOUNTER — Ambulatory Visit (INDEPENDENT_AMBULATORY_CARE_PROVIDER_SITE_OTHER): Payer: Medicare Other | Admitting: *Deleted

## 2010-07-20 DIAGNOSIS — K55069 Acute infarction of intestine, part and extent unspecified: Secondary | ICD-10-CM

## 2010-07-20 DIAGNOSIS — K55059 Acute (reversible) ischemia of intestine, part and extent unspecified: Secondary | ICD-10-CM

## 2010-07-20 LAB — POCT INR: INR: 2.6

## 2010-08-17 ENCOUNTER — Ambulatory Visit (INDEPENDENT_AMBULATORY_CARE_PROVIDER_SITE_OTHER): Payer: Medicare Other | Admitting: *Deleted

## 2010-08-17 ENCOUNTER — Ambulatory Visit (INDEPENDENT_AMBULATORY_CARE_PROVIDER_SITE_OTHER): Payer: Medicare Other | Admitting: Internal Medicine

## 2010-08-17 ENCOUNTER — Encounter: Payer: Self-pay | Admitting: Internal Medicine

## 2010-08-17 DIAGNOSIS — I5022 Chronic systolic (congestive) heart failure: Secondary | ICD-10-CM

## 2010-08-17 DIAGNOSIS — I2589 Other forms of chronic ischemic heart disease: Secondary | ICD-10-CM

## 2010-08-17 DIAGNOSIS — K55059 Acute (reversible) ischemia of intestine, part and extent unspecified: Secondary | ICD-10-CM

## 2010-08-17 DIAGNOSIS — Z95810 Presence of automatic (implantable) cardiac defibrillator: Secondary | ICD-10-CM

## 2010-08-17 DIAGNOSIS — K55069 Acute infarction of intestine, part and extent unspecified: Secondary | ICD-10-CM

## 2010-08-17 LAB — ICD DEVICE OBSERVATION
ATRIAL PACING ICD: 68 pct
BAMS-0001: 150 {beats}/min
BAMS-0003: 70 {beats}/min
HV IMPEDENCE: 60 Ohm
LV LEAD AMPLITUDE: 23.3 mv
LV LEAD IMPEDENCE ICD: 437 Ohm
LV LEAD THRESHOLD: 1 V
RV LEAD AMPLITUDE: 24.2 mv
RV LEAD IMPEDENCE ICD: 653 Ohm
RV LEAD THRESHOLD: 0.7 V
TOT-0001: 1
TZAT-0001FASTVT: 1
TZAT-0001FASTVT: 2
TZAT-0002FASTVT: NEGATIVE
TZAT-0004FASTVT: 8
TZAT-0005FASTVT: 81 pct
TZAT-0018FASTVT: NEGATIVE
TZON-0005FASTVT: 1
TZST-0001FASTVT: 3
TZST-0001FASTVT: 5
TZST-0001FASTVT: 7
TZST-0003FASTVT: 31 J
TZST-0003FASTVT: 41 J
TZST-0003FASTVT: 41 J

## 2010-08-17 LAB — POCT INR: INR: 2.7

## 2010-08-17 NOTE — Assessment & Plan Note (Signed)
Stable on current meds 

## 2010-08-17 NOTE — Assessment & Plan Note (Signed)
Stable The patient's device was interrogated.  The information was reviewed. No changes were made in the programming.

## 2010-08-17 NOTE — Progress Notes (Signed)
HPI  [name] is a 77 y.o. male ICD implanted for primary prevention in this setting of ischemic coronary disease as part of the MADIT-CRT trial. His last catheterization in 2006 demonstrated a total right and 80% marginal. Vein graft to the diagonal was occluded and other bypasses were open;  ejection fraction 2007 was about 35%  The patient denies SOB, chest pain, edema or palpitations His major problem ahs been his Crohns disease    Past Medical History  Diagnosis Date  . Postural dizziness   . Hypertension   . Ischemic cardiomyopathy     CAD catherization 1996.[name] occluded vein graft to the diagnol, other grafts patent/  catherization 2006, no PCI/ nuclear.[name] october 2011.[name] extensive scar anteroseptal and apical.. no ischemia    . Hx of coronary artery bypass graft 1994  . CHF (congestive heart failure)     chronic systolic  . Mitral regurgitation     mild.[name] echo november 2011  EF 35%...echo.. november 2009/ ef 35%.[name] echo november 2011  . Sinus bradycardia   . Chronotropic incompetence   . Automatic implantable cardiac defibrillator in situ     GDT CONTAK H170-MADIT-CRT-EXPLANTED 2011 implanted defibrillator- Guidant cognis, model n119-2001 Dr. Caryl Comes (11/28/2009)  . LBBB (left bundle branch block)   . Hyperlipemia   . GERD (gastroesophageal reflux disease)   . Colonic polyp   . Crohn's disease   . Acute vascular insufficiency of intestine     Mesenteric embolus...surgery 2003  . Ventral hernia   . Hypertrophy of prostate with urinary obstruction and other lower urinary tract symptoms (LUTS)   . Degenerative joint disease   . Polymyalgia rheumatica   . Tremor   . Anxiety   . Shingles   . Vitamin B12 deficiency   . Vitamin D deficiency     coumadin therapy mesenteric embolus...2003 he    Past Surgical History  Procedure Date  . Coronary artery bypass graft 1994  . Laparoscopic cholecystectomy 2002  . Elap w/ superior mesenteric artery embolectomy 1/03    by Dr.  Jennette Banker  . Repair of incarcerated ventral hernia and lysis of adhesions 11/03    by Dr. Betsy Pries  . Implantation of biventric cardioverter-defibrillatorr     by Dr. Lovena Le in 8/06 [employer] 2011  . Implantation of guidant cognis device     model [id number]  . Left inguinal hernia repair with mesh 6/08    by Dr. Betsy Pries    Current Outpatient Prescriptions  Medication Sig Dispense Refill  . carvedilol (COREG) 6.25 MG tablet 1 tablet twice a day       . cholecalciferol (VITAMIN D) 1000 UNITS tablet 1-2 tablets once a day       . folic acid (FOLVITE) 1 MG tablet Take 1 mg by mouth daily.        . furosemide (LASIX) 40 MG tablet One tablet once a day       . hydrocortisone (ANUSOL-HC) 2.5 % rectal cream Use as directed  30 g  11  . lisinopril (PRINIVIL,ZESTRIL) 10 MG tablet Take 10 mg by mouth daily.        . mercaptopurine (PURINETHOL) 50 MG tablet Take 1.5 tabs by mouth once daily as directed by Dr. Su Ley       . simvastatin (ZOCOR) 20 MG tablet Take 20 mg by mouth at bedtime.        [name] spironolactone (ALDACTONE) 25 MG tablet One tablet once a day       .  warfarin (COUMADIN) 5 MG tablet Take by mouth as directed.        . predniSONE (DELTASONE) 20 MG tablet Take as directed  25 tablet  0    No Known Allergies  Review of Systems negative except from HPI and PMH  Physical Exam Well developed and well nourished in no acute distress HENT normal E scleral and icterus clear Neck Supple JVP flat; carotids brisk and full Clear to ausculation Regular rate and rhythm, no murmurs gallops or rub Abd girdle in place No clubbing cyanosis and edema Alert and oriented, grossly normal motor and sensory function Skin Warm and Dry    Assessment and  Plan

## 2010-09-14 ENCOUNTER — Ambulatory Visit (INDEPENDENT_AMBULATORY_CARE_PROVIDER_SITE_OTHER): Payer: Medicare Other | Admitting: *Deleted

## 2010-09-14 DIAGNOSIS — K55059 Acute (reversible) ischemia of intestine, part and extent unspecified: Secondary | ICD-10-CM

## 2010-09-14 DIAGNOSIS — K55069 Acute infarction of intestine, part and extent unspecified: Secondary | ICD-10-CM

## 2010-09-14 LAB — POCT INR: INR: 2.6

## 2010-10-12 ENCOUNTER — Ambulatory Visit (INDEPENDENT_AMBULATORY_CARE_PROVIDER_SITE_OTHER): Payer: Medicare Other | Admitting: *Deleted

## 2010-10-12 DIAGNOSIS — K55059 Acute (reversible) ischemia of intestine, part and extent unspecified: Secondary | ICD-10-CM

## 2010-10-12 DIAGNOSIS — K55069 Acute infarction of intestine, part and extent unspecified: Secondary | ICD-10-CM

## 2010-10-12 LAB — POCT INR: INR: 2.5

## 2010-11-09 ENCOUNTER — Ambulatory Visit (INDEPENDENT_AMBULATORY_CARE_PROVIDER_SITE_OTHER): Payer: Medicare Other | Admitting: *Deleted

## 2010-11-09 DIAGNOSIS — K55059 Acute (reversible) ischemia of intestine, part and extent unspecified: Secondary | ICD-10-CM

## 2010-11-09 DIAGNOSIS — K55069 Acute infarction of intestine, part and extent unspecified: Secondary | ICD-10-CM

## 2010-11-17 LAB — PROTIME-INR: Prothrombin Time: 14.3

## 2010-11-18 ENCOUNTER — Other Ambulatory Visit: Payer: Self-pay | Admitting: Internal Medicine

## 2010-11-18 ENCOUNTER — Encounter: Payer: Self-pay | Admitting: Internal Medicine

## 2010-11-18 ENCOUNTER — Ambulatory Visit (INDEPENDENT_AMBULATORY_CARE_PROVIDER_SITE_OTHER): Payer: Medicare Other | Admitting: *Deleted

## 2010-11-18 DIAGNOSIS — I428 Other cardiomyopathies: Secondary | ICD-10-CM

## 2010-11-18 DIAGNOSIS — I495 Sick sinus syndrome: Secondary | ICD-10-CM

## 2010-11-18 DIAGNOSIS — Z95810 Presence of automatic (implantable) cardiac defibrillator: Secondary | ICD-10-CM

## 2010-11-18 LAB — COMPREHENSIVE METABOLIC PANEL
AST: 25
CO2: 30
Calcium: 9.2
Creatinine, Ser: 1.07
GFR calc Af Amer: >60
GFR calc non Af Amer: >60
Glucose, Bld: 106 — ABNORMAL HIGH

## 2010-11-18 LAB — CBC
MCHC: 34.2
MCV: 93.6
RBC: 4.7
RDW: 15.3 — ABNORMAL HIGH

## 2010-11-18 LAB — URINALYSIS, ROUTINE W REFLEX MICROSCOPIC
Ketones, ur: NEGATIVE
Nitrite: NEGATIVE
Protein, ur: NEGATIVE

## 2010-11-18 LAB — DIFFERENTIAL
Lymphocytes Relative: 28
Lymphs Abs: 1.5
Neutrophils Relative %: 58

## 2010-11-18 LAB — PROTIME-INR: Prothrombin Time: 24.4 — ABNORMAL HIGH

## 2010-11-18 LAB — APTT: aPTT: 41 — ABNORMAL HIGH

## 2010-11-19 LAB — REMOTE ICD DEVICE
ATRIAL PACING ICD: 79 pct
BRDY-0003RA: 130 {beats}/min
FVT: 0
PACEART VT: 0
RV LEAD IMPEDENCE ICD: 580 Ohm
TOT-0006: 20120717000000
TZAT-0018FASTVT: NEGATIVE
TZST-0001FASTVT: 3
TZST-0001FASTVT: 5
TZST-0001FASTVT: 8
TZST-0003FASTVT: 31 J
TZST-0003FASTVT: 41 J
TZST-0003FASTVT: 41 J
TZST-0003FASTVT: 41 J

## 2010-11-24 ENCOUNTER — Encounter: Payer: Self-pay | Admitting: Pulmonary Disease

## 2010-11-24 ENCOUNTER — Other Ambulatory Visit (INDEPENDENT_AMBULATORY_CARE_PROVIDER_SITE_OTHER): Payer: Medicare Other

## 2010-11-24 ENCOUNTER — Ambulatory Visit (INDEPENDENT_AMBULATORY_CARE_PROVIDER_SITE_OTHER): Payer: Medicare Other | Admitting: Pulmonary Disease

## 2010-11-24 DIAGNOSIS — F419 Anxiety disorder, unspecified: Secondary | ICD-10-CM

## 2010-11-24 DIAGNOSIS — E538 Deficiency of other specified B group vitamins: Secondary | ICD-10-CM

## 2010-11-24 DIAGNOSIS — I5022 Chronic systolic (congestive) heart failure: Secondary | ICD-10-CM

## 2010-11-24 DIAGNOSIS — K5090 Crohn's disease, unspecified, without complications: Secondary | ICD-10-CM

## 2010-11-24 DIAGNOSIS — N139 Obstructive and reflux uropathy, unspecified: Secondary | ICD-10-CM

## 2010-11-24 DIAGNOSIS — K55059 Acute (reversible) ischemia of intestine, part and extent unspecified: Secondary | ICD-10-CM

## 2010-11-24 DIAGNOSIS — Z23 Encounter for immunization: Secondary | ICD-10-CM

## 2010-11-24 DIAGNOSIS — F411 Generalized anxiety disorder: Secondary | ICD-10-CM

## 2010-11-24 DIAGNOSIS — M353 Polymyalgia rheumatica: Secondary | ICD-10-CM

## 2010-11-24 DIAGNOSIS — E785 Hyperlipidemia, unspecified: Secondary | ICD-10-CM

## 2010-11-24 DIAGNOSIS — D126 Benign neoplasm of colon, unspecified: Secondary | ICD-10-CM

## 2010-11-24 DIAGNOSIS — I2589 Other forms of chronic ischemic heart disease: Secondary | ICD-10-CM

## 2010-11-24 DIAGNOSIS — R259 Unspecified abnormal involuntary movements: Secondary | ICD-10-CM

## 2010-11-24 DIAGNOSIS — I10 Essential (primary) hypertension: Secondary | ICD-10-CM

## 2010-11-24 DIAGNOSIS — M1990 Unspecified osteoarthritis, unspecified site: Secondary | ICD-10-CM

## 2010-11-24 DIAGNOSIS — E559 Vitamin D deficiency, unspecified: Secondary | ICD-10-CM

## 2010-11-24 DIAGNOSIS — K219 Gastro-esophageal reflux disease without esophagitis: Secondary | ICD-10-CM

## 2010-11-24 LAB — HEPATIC FUNCTION PANEL
ALT: 47 U/L (ref 0–53)
Total Protein: 6.1 g/dL (ref 6.0–8.3)

## 2010-11-24 LAB — BASIC METABOLIC PANEL
BUN: 9 mg/dL (ref 6–23)
CO2: 28 mEq/L (ref 19–32)
Chloride: 106 mEq/L (ref 96–112)
Creatinine, Ser: 0.8 mg/dL (ref 0.4–1.5)
Glucose, Bld: 117 mg/dL — ABNORMAL HIGH (ref 70–99)

## 2010-11-24 LAB — CBC WITH DIFFERENTIAL/PLATELET
Basophils Relative: 0.5 % (ref 0.0–3.0)
Eosinophils Absolute: 0.1 10*3/uL (ref 0.0–0.7)
Eosinophils Relative: 1.5 % (ref 0.0–5.0)
Lymphocytes Relative: 34.9 % (ref 12.0–46.0)
MCHC: 34.9 g/dL (ref 30.0–36.0)
Neutrophils Relative %: 53.6 % (ref 43.0–77.0)
Platelets: 85 10*3/uL — ABNORMAL LOW (ref 150.0–400.0)
RBC: 3.86 Mil/uL — ABNORMAL LOW (ref 4.22–5.81)
WBC: 4.2 10*3/uL — ABNORMAL LOW (ref 4.5–10.5)

## 2010-11-24 LAB — TSH: TSH: 1.51 u[IU]/mL (ref 0.35–5.50)

## 2010-11-24 LAB — VITAMIN B12: Vitamin B-12: 573 pg/mL (ref 211–911)

## 2010-11-24 LAB — LIPID PANEL
Cholesterol: 124 mg/dL (ref 0–200)
Triglycerides: 109 mg/dL (ref 0.0–149.0)

## 2010-11-24 NOTE — Progress Notes (Signed)
Subjective:    Patient ID: [name], male    DOB: 31-Oct-1931, 77 y.o.   MRN: [id number]  HPI  [name] here for a follow up visit... he has multiple medical problems as noted below...    ~  [place]owdhury [place] w/ sm bowel Crohn's- s/p mult resections w/ complications, stable on 6-MP for prevention of recurrence... also has sl incr fasting bili c/w Gilbert's... ~  followed by [name] for Cards- w/ CAD, s/p CABG, Cardiomyopathy w/ AICD- stable... f/u 2DEcho 11/09 showed diffuseHK w/ EF= 35% & DD, mod incr AoV thickness w/o AS... EKG= pacer rhythm.  ~  July 21, 2009:  he saw [facility] 6/11 w/ some incr edema & SPIRONOLACTONE 12.42m/d started & KCl stopped... he notes dizziness improved, still w/ mult somatic complaints & we discussed holding tight for now.  ~  November 18, 2009:  he has maintained regular f/u w/ Cards- DBenay Spice8/11 & DrKlein 10/11- pacer generator near EOL and pt noted decr energy "I'm slowing down, harder to get going";  they plan Myoview 1st, then device generator replacement... he is due for a follow up appt w/ DrChowdury as well... due for Flu shot today & f/u fasting labs (see below)...  ~  May 19, 2010:  622moOV & he notes that his Crohn's has been acting up- followed by GI in [place] on 6MP & ?cholestyramine added?  He wants Prednisone restarted & I asked him to call GI- now DrHolmes to discuss, in the meanwhile we will give him a Pred taper, but he understands that a regular dose of this med requires GI to follow response;      He saw DrBenay [name]/12 & DrKlein 12/11- cardiomyop stable (extensive old scar, no ischemia, EF=34%), CDopplers no change, AICD replaced 10/11 & then reprogrammed for chronotropic incompetence, Coreg decr to 6.25Bid...  He had labs 10/11 & these are reviewed w/ pt & wife...  ~  November 24, 2010:  5m69moV & he has been generally stable, notes some discomfort in his lower back & left hip area w/ occas radiation down left leg; we discussed  heat, Advil, exercises, and the need for Ortho eval if the discomfort persists or worsens...     HBP, CAD, Cardiomyop> on Coreg6.25Bid, Lisinopril10, Lasix40, Spironolactone25 & Coumadin; BP= 126/68 & he denies CP, palpit, dizzy, ch in SOB, edema, etc; he saw DrKlein 7/12- felt to be stable on meds & defibrillator interrogated, no changes made...    Chol> on Simva20 & f/u FLP looks good, at goal, continue same...    GI> Gerd, Polyps, Crohn's> on 6MP [place]owdhury [place]; stable, continue same rx...    Hx intestinal vasc insuffic 2003> he remains on Coumadin followed in [facility] & stable...    GU> BPH followed by DrNesi & prev on Flomax but he stopped & states voiding is satis etc...    DJD/ ?PMR> off Pred, he states he read where this "comes from crohn's" he says; see above & he will try Heat, Advil, Tylenol...    Tremor/ Anxiety> prev on Klonopin 0.18m37m from us &[place], DrYan, but he stopped on his own...         Problem List:  HBP, CAD, Cardiomyopathy - followed by DrKaBenay [name] DrKlein... he had CABG in 1994 (with cath in 96 revealing a total right and an 80% OM, vein graft to the diagonal was occluded, other bypasses were patent), Biv AICD in 2006...  On  COUMADIN (follow in clinic), ASA 78m/d,  COREG 6.289mid,  LISINOPRIL 204m,  LASIX 53m52m  SPIRONOLACTONE 25mg32m. ~  Cardiolite 3/03 w/ EF=37%...  ~  Cath 6/06 w/ native vessel and graft disease...  ~  2DEcho 11/09 stable w/ mod LVD- diffuse HK w/ EF= 35%, DD, mod incr AoV thickness w/o AS...  ~  3/11:  CXR showed stable CABG, AICD, & chr changes... ~  10/11:  Myoview showed extensive scar, no ischemia, EF= 34% ~  10/11:  New AICD placed by DrKlein... ~  11/11:  2DEcho showed mod reduced LV sys function w/ EF= 30-35%, diffuse HK, gr1 DD> see report...  HYPERLIPIDEMIA - he takes SIMVASTATIN 20mg/62m he's had min, non-progressive incr LFTs noted. ~  FLP 4/[place]ed TChol 138, TG 138, HDL 38, LDL 72... tolerating med well. ~  FLP 4/[place] showed TChol 118, TG 111, HDL 33, LDL 63 ~  FLP 12/09 showed TChol 118, TG 100, HDL 31, LDL 67 ~  FLP 10/10 on Simva20 showed TChol 111, TG 139, HDL 36, LDL 47 ~  FLP 10/11 on Simva20 showed TChol 101, TG 102, HDL 35, LDL 45 ~  FLP 10/12 on Simva20 showed TChol 124, TG 109, HDL 49, LDL 54  GERD (ICD-530.81) - no active symptoms and not currently taking PPI etc...  COLONIC POLYPS (ICD-211.3) - last colonoscopy here was 7/04 by DrKaplDemetra Shinervertics and several polyps from 6 - 20mm s56m reportedly adenomatous on bx but I cannot find the path reports... he is overdue for f/u colon... ~  labs 2/10 showed Hg= 15.7... we wMarland KitchenMarland Kitchenl send copy of note to DrChowdhury w/ request for f/u colonoscopy. ~  f/u colonoscopy 11/10 by DrChowdhury showed norm TI & several polyps removed- path +tubular adenoma...  CROHN'S DISEASE (ICD-555.9) - he is [place]Chowdhury [place] 6-MP 50mg ta78m1.5 tabs/d... he notes intermittent diarrhea and was started on Questran Prn... ~  labs 3/11 noted sl incr LFTs, Protime, & Sed... ~  labs 10/11 showed norm chems & LFTs x incr bili, Plat=88K, BNP=219, otherw norm. ~  Labs 10/12 showed norm chems, SGOT=59, TBili=3.9, Plat=85K...  Hx of ACUTE VASCULAR INSUFFICIENCY OF INTESTINE (ICD-557.0) - he has embolectomy of the superior mesenteric artery 1/03 by DrLawsonSheryn [name] w/ complications)... he has remained on COUMADIN in the coumadin clinic ever since then...  VENTRAL HERNIA (ICD-553.20) - he is s/p left inguinal hernia repair 6/08 by DrIngramClarita Leberd a prev incarcerated ventral hernia repaired 11/03... his residual/ recurrent ventral hernia has been evaluated by DrIngramClarita Leberls that [place] comes to surg... currently he wears an abd binder when up and about...  HYPERTROPHY PROSTATE W/UR OBST & OTH LUTS (ICD-600.01) - saw DrNesi 7/10 w/ BPH, obstructive symptoms, and ED- given  Flomax & "shots"...  ~  Labs 10/12 showed  PSA= 2.93  DEGENERATIVE JOINT DISEASE (ICD-715.90)  Hx of POLYMYALGIA RHEUMATICA (ICD-725) - hasn't req steroids x yrs... ~  labs 2/10 showed Sed= 8 ~  labs 10/10 showed Sed= 6 ~  labs 3/11 showed Sed= 51 w/ acute illness. ~  labs 10/11 showed Sed= 9 ~  Labs 10/12 showed Sed= 9  TREMOR (ICD-781.0) - eval 3/10 by DrYan, GDalphine HandingrdNeurology- essential tremor w/ +FamHx in sister... trial Clonazepam 0.25mg Bid47mimprovement...  ANXIETY (ICD-300.00) - he uses the same Clonazepam 0.25mg Prn.59mHx of SHINGLES (ICD-053.9)  Thrombocytopenia >> serial labs w/ Plat counts betw 223 ==> 85; no  bleeding diathesis, & he had acute intestinal vasc insuffic 2003 requiring embolectomy & has been on Coumadin Rx ever since then... ~    VITAMIN B12 DEFICIENCY (ICD-266.2) - on B 12 1070mg/d OTC... ~  labs 2/10 showed B12 level = 215... rec start oral B12 supplement daily. ~  labs 10/10 showed B12 level = 416... continue oral B12 supplement. ~  labs 10/11 showed B12= 592... continue same. ~  Labs 10/12 showed B12= 573  VITAMIN D DEFICIENCY (ICD-268.9) - on Vit D 1000 u daily OTC... ~  labs 2/10 showed Vit D level = 23... rec> start Vit D 1000 u daily.. ~  Labs 10/12 showed Vit D level = 50   Past Surgical History  Procedure Date  . Coronary artery bypass graft 1994  . Laparoscopic cholecystectomy 2002  . Elap w/ superior mesenteric artery embolectomy 1/03    by Dr. LJennette [name] . Repair of incarcerated ventral hernia and lysis of adhesions 11/03    by Dr. HBetsy [name] . Implantation of biventric cardioverter-defibrillatorr     by Dr. TLovena Lein 8/06 [employer]2011  . Implantation of guidant cognis device     model [id number] . Left inguinal hernia repair with mesh 6/08    by Dr. HBetsy [name]   Outpatient Encounter Prescriptions as of 11/24/2010  Medication Sig Dispense Refill  . carvedilol (COREG) 6.25 MG tablet 1 tablet twice a day       . cholecalciferol (VITAMIN D) 1000 UNITS  tablet 1-2 tablets once a day       . Cyanocobalamin (B-12) 1000 MCG CAPS Take 1 capsule by mouth daily.        . folic acid (FOLVITE) 1 MG tablet Take 1 mg by mouth daily.        . furosemide (LASIX) 40 MG tablet One tablet once a day       . hydrocortisone (ANUSOL-HC) 2.5 % rectal cream Use as directed  30 g  11  . lisinopril (PRINIVIL,ZESTRIL) 10 MG tablet Take 10 mg by mouth daily.        . mercaptopurine (PURINETHOL) 50 MG tablet Take 1.5 tabs by mouth once daily as directed by Dr. CSu Ley      . simvastatin (ZOCOR) 20 MG tablet Take 20 mg by mouth at bedtime.        .Marland Kitchenspironolactone (ALDACTONE) 25 MG tablet One tablet once a day       . warfarin (COUMADIN) 5 MG tablet Take by mouth as directed.        .Marland KitchenDISCONTD: predniSONE (DELTASONE) 20 MG tablet Take as directed  25 tablet  0    No Known Allergies   Current Medications, Allergies, Past Medical History, Past Surgical History, Family History, and Social History were reviewed in CReliant Energyrecord.    Review of Systems         See HPI - all other systems neg except as noted... The patient complains of decreased hearing and dyspnea on exertion.  The patient denies anorexia, fever, weight loss, weight gain, vision loss, hoarseness, chest pain, syncope, peripheral edema, prolonged cough, headaches, hemoptysis, abdominal pain, melena, hematochezia, severe indigestion/heartburn, hematuria, incontinence, muscle weakness, suspicious skin lesions, transient blindness, difficulty walking, depression, unusual weight change, abnormal bleeding, enlarged lymph nodes, and angioedema.     Objective:   Physical Exam     WD, WN, 77y/o WM in NAD... GENERAL:  Alert & oriented; pleasant & cooperative... HEENT:  [place]/AT,  EOM-wnl, PERRLA, EACs-clear, TMs-wnl, NOSE- sl red/ inflamm, THROAT-clear & wnl. NECK:  Supple w/ fairROM; no JVD; normal carotid impulses w/o bruits; no thyromegaly or nodules palpated; no  lymphadenopathy. CHEST:  Decr BS w/ few scat rhonchi, no wheezing, no rales, no signs of consolidation. HEART:  Regular Rhythm; gr 1/6 SEM without rubs or gallops detected... ABDOMEN:  Soft & nontender; mult scars, & ventral hernia; normal bowel sounds; no organomegaly or masses palpated. EXT: without deformities, mod arthritic changes; no varicose veins/ venous insuffic/ or edema. NEURO:  CN's intact; I do not apprec a tremor at present; no focal neuro deficits... DERM:  No lesions noted; no rash etc...   Assessment & Plan:   HBP>  BP controlled, continue same meds, and try to incr activity...  CAD/ Cardiomyopathy>  Followed by [name] & DrKlein, their notes are reviewed...  Hyperlipid>  On Simva20 + diet> continue same...  GI>  Followed [place] now by [facility] as DrChowdhury has moved to [facility]; continue meds & close f/u w/ GI...  Hx vasc insuffic intestines>  SMA embolectomy 2003, he remains on Coumadin via the [facility]...  Other medical problems as noted.Marland KitchenMarland Kitchen

## 2010-11-24 NOTE — Patient Instructions (Signed)
Today we updated your med list in our EPIC system...    Continue your current medications the same...  Today we did your follow up fasting blood work...    Please call the PHONE TREE in a few days for your results...    Dial [id number] & when prompted enter your patient number followed by the # symbol...    Your patient number is:  [id number]#  We gave you the 2012 Flu vaccine today...  Stay as active as possible...  Call for any problems...  Let's plan a follow up visit in 6 months.Marland KitchenMarland Kitchen

## 2010-11-25 ENCOUNTER — Encounter: Payer: Self-pay | Admitting: Pulmonary Disease

## 2010-11-25 LAB — VITAMIN D 25 HYDROXY (VIT D DEFICIENCY, FRACTURES): Vit D, 25-Hydroxy: 50 ng/mL (ref 30–89)

## 2010-11-30 NOTE — Progress Notes (Signed)
icd remote check

## 2010-12-04 ENCOUNTER — Encounter: Payer: Self-pay | Admitting: Pulmonary Disease

## 2010-12-07 ENCOUNTER — Ambulatory Visit (INDEPENDENT_AMBULATORY_CARE_PROVIDER_SITE_OTHER): Payer: Medicare Other | Admitting: *Deleted

## 2010-12-07 DIAGNOSIS — K55059 Acute (reversible) ischemia of intestine, part and extent unspecified: Secondary | ICD-10-CM

## 2010-12-07 DIAGNOSIS — K55069 Acute infarction of intestine, part and extent unspecified: Secondary | ICD-10-CM

## 2010-12-07 LAB — POCT INR: INR: 2.2

## 2011-01-18 ENCOUNTER — Other Ambulatory Visit: Payer: Self-pay | Admitting: Cardiology

## 2011-01-18 ENCOUNTER — Ambulatory Visit (INDEPENDENT_AMBULATORY_CARE_PROVIDER_SITE_OTHER): Payer: Medicare Other | Admitting: *Deleted

## 2011-01-18 DIAGNOSIS — K55059 Acute (reversible) ischemia of intestine, part and extent unspecified: Secondary | ICD-10-CM

## 2011-01-18 DIAGNOSIS — K55069 Acute infarction of intestine, part and extent unspecified: Secondary | ICD-10-CM

## 2011-01-18 MED ORDER — WARFARIN SODIUM 5 MG PO TABS: 5.0000 mg | ORAL_TABLET | ORAL | Status: DC

## 2011-01-20 ENCOUNTER — Other Ambulatory Visit: Payer: Self-pay | Admitting: *Deleted

## 2011-01-20 MED ORDER — WARFARIN SODIUM 5 MG PO TABS: 5.0000 mg | ORAL_TABLET | ORAL | Status: DC

## 2011-01-26 ENCOUNTER — Telehealth: Payer: Self-pay | Admitting: Pulmonary Disease

## 2011-01-26 MED ORDER — OSELTAMIVIR PHOSPHATE 75 MG PO CAPS: 75.0000 mg | ORAL_CAPSULE | Freq: Two times a day (BID) | ORAL | Status: AC

## 2011-01-26 MED ORDER — AZITHROMYCIN 250 MG PO TABS: ORAL_TABLET | ORAL | Status: AC

## 2011-01-26 NOTE — Telephone Encounter (Signed)
Spouse is aware of RA recs. She voiced her understanding. Rx's have been sent to the pharmacy

## 2011-01-26 NOTE — Telephone Encounter (Signed)
I spoke with pt and he c/o body aches, sweats and chills, cough w/ green phlem in the am and it's clear in the evenings, chest congestion x couple days. Pt is not sure if he is running a fever. Pt is taking mucinex at bedtime. Since SN is not in and no available openings will forward to Dr. Elsworth Soho for recs. Please advise thanks  No Known Allergies

## 2011-01-26 NOTE — Telephone Encounter (Signed)
Tamiflu 75 bid x 5ds Zpak Plenty of oral fluids Robitussin DM OV if worse or no better by friday

## 2011-01-27 ENCOUNTER — Telehealth: Payer: Self-pay

## 2011-01-27 MED ORDER — SPIRONOLACTONE 25 MG PO TABS: 25.0000 mg | ORAL_TABLET | Freq: Every day | ORAL | Status: DC

## 2011-01-27 MED ORDER — SIMVASTATIN 20 MG PO TABS: 20.0000 mg | ORAL_TABLET | Freq: Every day | ORAL | Status: DC

## 2011-01-27 MED ORDER — FUROSEMIDE 40 MG PO TABS: 40.0000 mg | ORAL_TABLET | Freq: Every day | ORAL | Status: DC

## 2011-01-27 NOTE — Telephone Encounter (Signed)
Refill sent for spironolactone, lasix and simvastatin

## 2011-02-08 ENCOUNTER — Telehealth: Payer: Self-pay | Admitting: Pulmonary Disease

## 2011-02-08 NOTE — Telephone Encounter (Signed)
I spoke with spouse and she states pt has had a terrible cough x 2 months now. He has been taking mucinex. SN had an opening on Friday 02/11/11 at 11.00 and pt would like to come in at that time. Nothing further was needed

## 2011-02-11 ENCOUNTER — Ambulatory Visit: Payer: Medicare Other | Admitting: Pulmonary Disease

## 2011-02-14 ENCOUNTER — Other Ambulatory Visit: Payer: Self-pay | Admitting: Cardiology

## 2011-02-14 MED ORDER — LISINOPRIL 10 MG PO TABS: 10.0000 mg | ORAL_TABLET | Freq: Every day | ORAL | Status: DC

## 2011-02-14 MED ORDER — WARFARIN SODIUM 5 MG PO TABS: ORAL_TABLET | ORAL | Status: DC

## 2011-02-14 MED ORDER — CARVEDILOL 6.25 MG PO TABS: 6.2500 mg | ORAL_TABLET | Freq: Two times a day (BID) | ORAL | Status: DC

## 2011-02-14 MED ORDER — FUROSEMIDE 40 MG PO TABS: 40.0000 mg | ORAL_TABLET | Freq: Every day | ORAL | Status: DC

## 2011-02-14 MED ORDER — SIMVASTATIN 20 MG PO TABS: 20.0000 mg | ORAL_TABLET | Freq: Every day | ORAL | Status: DC

## 2011-02-14 MED ORDER — SPIRONOLACTONE 25 MG PO TABS: 25.0000 mg | ORAL_TABLET | Freq: Every day | ORAL | Status: DC

## 2011-02-14 NOTE — Telephone Encounter (Signed)
New Msg: Pt wife calling needing refill of medications. Pt only has enough medication to last the week. Please put a rush on medication refill request.

## 2011-02-15 ENCOUNTER — Ambulatory Visit (INDEPENDENT_AMBULATORY_CARE_PROVIDER_SITE_OTHER): Payer: Medicare Other | Admitting: *Deleted

## 2011-02-15 DIAGNOSIS — K55059 Acute (reversible) ischemia of intestine, part and extent unspecified: Secondary | ICD-10-CM | POA: Diagnosis not present

## 2011-02-15 DIAGNOSIS — K55069 Acute infarction of intestine, part and extent unspecified: Secondary | ICD-10-CM

## 2011-02-17 ENCOUNTER — Encounter: Payer: Self-pay | Admitting: Internal Medicine

## 2011-02-17 ENCOUNTER — Ambulatory Visit (INDEPENDENT_AMBULATORY_CARE_PROVIDER_SITE_OTHER): Payer: Medicare Other | Admitting: *Deleted

## 2011-02-17 DIAGNOSIS — I495 Sick sinus syndrome: Secondary | ICD-10-CM

## 2011-02-17 DIAGNOSIS — I428 Other cardiomyopathies: Secondary | ICD-10-CM

## 2011-02-20 LAB — REMOTE ICD DEVICE
CHARGE TIME: 8.4 s
DEVICE MODEL ICD: 490477
FVT: 0
HV IMPEDENCE: 56 Ohm
PACEART VT: 0
RV LEAD IMPEDENCE ICD: 615 Ohm
TZAT-0001FASTVT: 2
TZAT-0013FASTVT: 2
TZAT-0018FASTVT: NEGATIVE
TZST-0001FASTVT: 3
TZST-0001FASTVT: 4
TZST-0001FASTVT: 7
TZST-0001FASTVT: 8
TZST-0003FASTVT: 23 J
TZST-0003FASTVT: 31 J
TZST-0003FASTVT: 41 J
TZST-0003FASTVT: 41 J
VENTRICULAR PACING ICD: 96 pct
VF: 0

## 2011-02-24 DIAGNOSIS — N390 Urinary tract infection, site not specified: Secondary | ICD-10-CM | POA: Diagnosis not present

## 2011-02-24 DIAGNOSIS — N529 Male erectile dysfunction, unspecified: Secondary | ICD-10-CM | POA: Diagnosis not present

## 2011-02-24 DIAGNOSIS — R5381 Other malaise: Secondary | ICD-10-CM | POA: Diagnosis not present

## 2011-02-24 DIAGNOSIS — R351 Nocturia: Secondary | ICD-10-CM | POA: Diagnosis not present

## 2011-02-28 NOTE — Progress Notes (Signed)
Remote icd check  

## 2011-03-04 ENCOUNTER — Encounter: Payer: Self-pay | Admitting: Cardiology

## 2011-03-04 DIAGNOSIS — R42 Dizziness and giddiness: Secondary | ICD-10-CM | POA: Insufficient documentation

## 2011-03-04 DIAGNOSIS — I255 Ischemic cardiomyopathy: Secondary | ICD-10-CM | POA: Insufficient documentation

## 2011-03-04 DIAGNOSIS — K439 Ventral hernia without obstruction or gangrene: Secondary | ICD-10-CM | POA: Insufficient documentation

## 2011-03-04 DIAGNOSIS — I10 Essential (primary) hypertension: Secondary | ICD-10-CM | POA: Insufficient documentation

## 2011-03-04 DIAGNOSIS — Z95810 Presence of automatic (implantable) cardiac defibrillator: Secondary | ICD-10-CM | POA: Insufficient documentation

## 2011-03-04 DIAGNOSIS — I2510 Atherosclerotic heart disease of native coronary artery without angina pectoris: Secondary | ICD-10-CM | POA: Insufficient documentation

## 2011-03-04 DIAGNOSIS — R001 Bradycardia, unspecified: Secondary | ICD-10-CM | POA: Insufficient documentation

## 2011-03-04 DIAGNOSIS — K5010 Crohn's disease of large intestine without complications: Secondary | ICD-10-CM | POA: Insufficient documentation

## 2011-03-04 DIAGNOSIS — E785 Hyperlipidemia, unspecified: Secondary | ICD-10-CM | POA: Insufficient documentation

## 2011-03-04 DIAGNOSIS — Z7901 Long term (current) use of anticoagulants: Secondary | ICD-10-CM | POA: Insufficient documentation

## 2011-03-04 DIAGNOSIS — R9430 Abnormal result of cardiovascular function study, unspecified: Secondary | ICD-10-CM | POA: Insufficient documentation

## 2011-03-04 DIAGNOSIS — I739 Peripheral vascular disease, unspecified: Secondary | ICD-10-CM

## 2011-03-04 DIAGNOSIS — I340 Nonrheumatic mitral (valve) insufficiency: Secondary | ICD-10-CM | POA: Insufficient documentation

## 2011-03-04 DIAGNOSIS — R251 Tremor, unspecified: Secondary | ICD-10-CM | POA: Insufficient documentation

## 2011-03-04 DIAGNOSIS — R972 Elevated prostate specific antigen [PSA]: Secondary | ICD-10-CM | POA: Diagnosis not present

## 2011-03-04 DIAGNOSIS — Z951 Presence of aortocoronary bypass graft: Secondary | ICD-10-CM | POA: Insufficient documentation

## 2011-03-04 DIAGNOSIS — I4589 Other specified conduction disorders: Secondary | ICD-10-CM | POA: Insufficient documentation

## 2011-03-04 DIAGNOSIS — I5022 Chronic systolic (congestive) heart failure: Secondary | ICD-10-CM | POA: Insufficient documentation

## 2011-03-04 DIAGNOSIS — I779 Disorder of arteries and arterioles, unspecified: Secondary | ICD-10-CM | POA: Insufficient documentation

## 2011-03-07 ENCOUNTER — Encounter: Payer: Self-pay | Admitting: Cardiology

## 2011-03-07 ENCOUNTER — Ambulatory Visit (INDEPENDENT_AMBULATORY_CARE_PROVIDER_SITE_OTHER): Payer: Medicare Other | Admitting: Cardiology

## 2011-03-07 VITALS — BP 138/66 | HR 65 | Ht 72.0 in | Wt 203.0 lb

## 2011-03-07 DIAGNOSIS — I2510 Atherosclerotic heart disease of native coronary artery without angina pectoris: Secondary | ICD-10-CM | POA: Diagnosis not present

## 2011-03-07 DIAGNOSIS — I2589 Other forms of chronic ischemic heart disease: Secondary | ICD-10-CM | POA: Diagnosis not present

## 2011-03-07 DIAGNOSIS — Z7901 Long term (current) use of anticoagulants: Secondary | ICD-10-CM | POA: Diagnosis not present

## 2011-03-07 DIAGNOSIS — Z95810 Presence of automatic (implantable) cardiac defibrillator: Secondary | ICD-10-CM | POA: Diagnosis not present

## 2011-03-07 DIAGNOSIS — I255 Ischemic cardiomyopathy: Secondary | ICD-10-CM

## 2011-03-07 DIAGNOSIS — I779 Disorder of arteries and arterioles, unspecified: Secondary | ICD-10-CM

## 2011-03-07 NOTE — Assessment & Plan Note (Signed)
The patient had only very mild disease in 2012. I will arrange for him to have a Doppler in January of 2014 and see me in February 2 014

## 2011-03-07 NOTE — Assessment & Plan Note (Signed)
Patient is receiving appropriate medicines for his cardiomyopathy. He is doing quite well.

## 2011-03-07 NOTE — Assessment & Plan Note (Signed)
His ICD is overseeing carefully by our electrophysiologist.

## 2011-03-07 NOTE — Progress Notes (Signed)
HPI Patient is seen today for cardiology followup. I saw him last February, 2012. He has coronary disease with an ischemic cardiopathy. He underwent CABG in 1994. He has an ICD in place. Nuclear scan in October, 2011 revealed extensive scar of the anteroseptal and able to walls. There was no ischemia area he's actually done well. He had problems with chronotropic incompetence. However his ICD was adjusted and he is actually doing very well. He's not having any signs of heart failure.  As part of today's evaluation I have carefully reviewed all of the patient's old cardiac history and I have completely upgraded his current electronic medical record. No Known Allergies  Current Outpatient Prescriptions  Medication Sig Dispense Refill  . aspirin 81 MG tablet Take 81 mg by mouth daily.        . carvedilol (COREG) 6.25 MG tablet Take 1 tablet (6.25 mg total) by mouth 2 (two) times daily with a meal. 1 tablet twice a day  180 tablet  3  . cholecalciferol (VITAMIN D) 1000 UNITS tablet 1-2 tablets once a day       . Cyanocobalamin (B-12) 1000 MCG CAPS Take 1 capsule by mouth daily.        . folic acid (FOLVITE) 1 MG tablet Take 1 mg by mouth daily.        . furosemide (LASIX) 40 MG tablet Take 1 tablet (40 mg total) by mouth daily. One tablet once a day  90 tablet  3  . hydrocortisone (ANUSOL-HC) 2.5 % rectal cream Use as directed  30 g  11  . lisinopril (PRINIVIL,ZESTRIL) 10 MG tablet Take 1 tablet (10 mg total) by mouth daily.  90 tablet  3  . mercaptopurine (PURINETHOL) 50 MG tablet Take 1.5 tabs by mouth once daily as directed by Dr. Su Ley       . simvastatin (ZOCOR) 20 MG tablet Take 1 tablet (20 mg total) by mouth at bedtime.  90 tablet  3  . spironolactone (ALDACTONE) 25 MG tablet Take 1 tablet (25 mg total) by mouth daily. One tablet once a day  90 tablet  3  . warfarin (COUMADIN) 5 MG tablet Take as directed by anticoagulation clinic  360 tablet  1    History   Social History  . Marital  Status: Married    Spouse Name: Kennyth Lose    Number of Children: 3  . Years of Education: N/A   Occupational History  . bull dozer driver   .     Social History Main Topics  . Smoking status: Former Smoker -- 1.0 packs/day for 20 years    Types: Cigarettes    Quit date: 01/31/1973  . Smokeless tobacco: Never Used  . Alcohol Use: Yes  . Drug Use: No  . Sexually Active: Not on file   Other Topics Concern  . Not on file   Social History Narrative  . No narrative on file    Family History  Problem Relation Age of Onset  . Heart disease    . Hypertension    . Hyperlipidemia    . Tremor Sister     Past Medical History  Diagnosis Date  . Dizziness     Positional dizziness  . Hypertension   . Ischemic cardiomyopathy     CABG 1994 CAD catherization 1996.[name] occluded vein graft to the diagnol, other grafts patent/  catherization 2006, no PCI/ nuclear.[name] october 2011.[name] extensive scar anteroseptal and apical.. no ischemia    . CHF (congestive heart  failure)     chronic systolic  . Mitral regurgitation     mild.[name] echo november 2011  EF 35%...echo.. november 2009/ ef 35%.[name] echo november 2011  . Sinus bradycardia   . Chronotropic incompetence   . Biventricular implantable cardiac defibrillator in situ     GDT CONTAK H170-MADIT-CRT-EXPLANTED 2011 implanted defibrillator- [employer] cognis, model n119-2001 Dr. Caryl Comes (11/28/2009)  . LBBB (left bundle branch block)   . Hyperlipemia   . GERD (gastroesophageal reflux disease)   . Colonic polyp   . Crohn's disease   . Acute vascular insufficiency of intestine     Mesenteric embolus...surgery 2003  . Ventral hernia   . Hypertrophy of prostate with urinary obstruction and other lower urinary tract symptoms (LUTS)   . Degenerative joint disease   . Polymyalgia rheumatica   . Tremor   . Anxiety   . Shingles   . Vitamin B12 deficiency   . CAD (coronary artery disease)     CABG 1994  /   catheterization 1996, occluded vein graft to the  diagonal, other grafts patent  /   catheterization 2006, no PCI  /  nuclear, October, 2011, extensive scar anteroseptal and apical, no ischemia  . Hx of CABG     1994  . Ejection fraction     EF 35%, echo, November, 2011  . Warfarin anticoagulation     Coumadin therapy for mesenteric embolus 2003  . Carotid artery disease     Doppler, February,  2012, 0-39% bilateral, recommended followup 1 year    Past Surgical History  Procedure Date  . Coronary artery bypass graft 1994  . Laparoscopic cholecystectomy 2002  . Elap w/ superior mesenteric artery embolectomy 1/03    by Dr. Jennette Banker  . Repair of incarcerated ventral hernia and lysis of adhesions 11/03    by Dr. Betsy Pries  . Implantation of biventric cardioverter-defibrillatorr     by Dr. Lovena Le in 8/06 [employer] [place] 2011  . Implantation of guidant cognis device     model [id number]  . Left inguinal hernia repair with mesh 6/08    by Dr. Betsy Pries    ROS  Patient denies fever, chills, headache, sweats, rash, change in vision, change in hearing, chest pain, cough, nausea vomiting, urinary symptoms. All other systems are reviewed and are negative.  PHYSICAL EXAM Patient looks quite good. He is oriented to person time and place. Affect is normal. There is no jugular venous distention. Lungs are clear. Respiratory effort is nonlabored. Cardiac exam reveals S1 and S2. There no clicks or significant murmurs. His abdomen is soft. His ICD site is nicely healed from the past. He has no peripheral edema. There no musculoskeletal deformities. There are no skin rashes. Filed Vitals:   03/07/11 1402  BP: 138/66  Pulse: 65  Height: 6' (1.829 m)  Weight: 203 lb (92.08 kg)    EKG EKG is done today and reviewed by me. It is paced. ASSESSMENT & PLAN

## 2011-03-07 NOTE — Assessment & Plan Note (Signed)
The patient continues on long term Coumadin for his mesenteric embolus in 2003.

## 2011-03-07 NOTE — Assessment & Plan Note (Signed)
Coronary disease is stable. He had a nuclear study in October, 2011. There was no ischemia. He is not having symptoms. He does not need any further workup at this time.

## 2011-03-07 NOTE — Patient Instructions (Signed)
Your physician wants you to follow-up in: 13 months, February 20014 You will receive a reminder letter in the mail two months in advance. If you don't receive a letter, please call our office to schedule the follow-up appointment.  Your physician has requested that you have a carotid duplex in January 2014. This test is an ultrasound of the carotid arteries in your neck. It looks at blood flow through these arteries that supply the brain with blood. Allow one hour for this exam. There are no restrictions or special instructions.

## 2011-03-15 ENCOUNTER — Encounter: Payer: Medicare Other | Admitting: *Deleted

## 2011-03-15 DIAGNOSIS — K5090 Crohn's disease, unspecified, without complications: Secondary | ICD-10-CM | POA: Diagnosis not present

## 2011-03-16 ENCOUNTER — Ambulatory Visit (INDEPENDENT_AMBULATORY_CARE_PROVIDER_SITE_OTHER): Payer: Medicare Other | Admitting: *Deleted

## 2011-03-16 DIAGNOSIS — K55059 Acute (reversible) ischemia of intestine, part and extent unspecified: Secondary | ICD-10-CM

## 2011-03-16 DIAGNOSIS — K55069 Acute infarction of intestine, part and extent unspecified: Secondary | ICD-10-CM

## 2011-03-25 ENCOUNTER — Ambulatory Visit (INDEPENDENT_AMBULATORY_CARE_PROVIDER_SITE_OTHER): Payer: Medicare Other | Admitting: *Deleted

## 2011-03-25 DIAGNOSIS — K55069 Acute infarction of intestine, part and extent unspecified: Secondary | ICD-10-CM

## 2011-03-25 DIAGNOSIS — K55059 Acute (reversible) ischemia of intestine, part and extent unspecified: Secondary | ICD-10-CM

## 2011-04-06 DIAGNOSIS — K5090 Crohn's disease, unspecified, without complications: Secondary | ICD-10-CM | POA: Diagnosis not present

## 2011-04-08 ENCOUNTER — Ambulatory Visit (INDEPENDENT_AMBULATORY_CARE_PROVIDER_SITE_OTHER): Payer: Medicare Other

## 2011-04-08 DIAGNOSIS — K55069 Acute infarction of intestine, part and extent unspecified: Secondary | ICD-10-CM

## 2011-04-08 DIAGNOSIS — K55059 Acute (reversible) ischemia of intestine, part and extent unspecified: Secondary | ICD-10-CM | POA: Diagnosis not present

## 2011-04-08 LAB — POCT INR: INR: 3.4

## 2011-04-13 ENCOUNTER — Telehealth: Payer: Self-pay

## 2011-04-13 ENCOUNTER — Encounter: Payer: Self-pay | Admitting: Cardiovascular Disease

## 2011-04-13 DIAGNOSIS — K55069 Acute infarction of intestine, part and extent unspecified: Secondary | ICD-10-CM

## 2011-04-13 NOTE — Telephone Encounter (Signed)
Message copied by Theophilus Kinds on Wed Apr 13, 2011  4:48 PM ------      Message from: [place], Bryant D      Created: Tue Apr 12, 2011  7:41 PM       OK to hold coumadin for 3 days.  No more.      ----- Message -----         From: Flossie Dibble, RN         Sent: 04/08/2011  11:07 AM           To: Carlena Bjornstad, MD, Alean Rinne, LPN            Pt is scheduled for tooth extraction on 04/25/11.  Pt had recent dental extraction mid Feb and had bleeding complications post procedure on Coumadin.  DDS is requesting pt hold Coumadin prior to procedure to prevent bleeding complications post this procedure.  Please advise if pt is ok to hold Coumadin 3-5 days prior to dental extraction. Thanks

## 2011-04-13 NOTE — Progress Notes (Signed)
This encounter was created in error - please disregard.

## 2011-04-13 NOTE — Telephone Encounter (Signed)
Called spoke with pt's wife, advised per Dr [name], pt could hold Coumadin prior to dental extractions x 3 days, no more than 3 days.  Pt's wife voices understanding. Advised her we will need to recheck his INR in 7 days after he resumes Coumadin.  She will call back to schedule once procedure is scheduled.

## 2011-04-14 ENCOUNTER — Encounter: Payer: Self-pay | Admitting: Adult Health

## 2011-04-14 ENCOUNTER — Telehealth: Payer: Self-pay | Admitting: Pulmonary Disease

## 2011-04-14 ENCOUNTER — Ambulatory Visit (INDEPENDENT_AMBULATORY_CARE_PROVIDER_SITE_OTHER): Payer: Medicare Other | Admitting: Adult Health

## 2011-04-14 ENCOUNTER — Inpatient Hospital Stay (HOSPITAL_COMMUNITY)
Admission: EM | Admit: 2011-04-14 | Discharge: 2011-04-19 | DRG: 394 | Disposition: A | Discharge status: Home / Self Care | Payer: Medicare Other | Attending: Internal Medicine | Admitting: Internal Medicine

## 2011-04-14 ENCOUNTER — Emergency Department (HOSPITAL_COMMUNITY): Payer: Medicare Other

## 2011-04-14 ENCOUNTER — Encounter (HOSPITAL_COMMUNITY): Payer: Self-pay

## 2011-04-14 DIAGNOSIS — F411 Generalized anxiety disorder: Secondary | ICD-10-CM

## 2011-04-14 DIAGNOSIS — R935 Abnormal findings on diagnostic imaging of other abdominal regions, including retroperitoneum: Secondary | ICD-10-CM | POA: Diagnosis present

## 2011-04-14 DIAGNOSIS — J209 Acute bronchitis, unspecified: Secondary | ICD-10-CM

## 2011-04-14 DIAGNOSIS — R9430 Abnormal result of cardiovascular function study, unspecified: Secondary | ICD-10-CM | POA: Diagnosis present

## 2011-04-14 DIAGNOSIS — K55059 Acute (reversible) ischemia of intestine, part and extent unspecified: Secondary | ICD-10-CM

## 2011-04-14 DIAGNOSIS — M1990 Unspecified osteoarthritis, unspecified site: Secondary | ICD-10-CM

## 2011-04-14 DIAGNOSIS — E785 Hyperlipidemia, unspecified: Secondary | ICD-10-CM

## 2011-04-14 DIAGNOSIS — R040 Epistaxis: Secondary | ICD-10-CM | POA: Diagnosis present

## 2011-04-14 DIAGNOSIS — E538 Deficiency of other specified B group vitamins: Secondary | ICD-10-CM | POA: Diagnosis present

## 2011-04-14 DIAGNOSIS — I498 Other specified cardiac arrhythmias: Secondary | ICD-10-CM | POA: Diagnosis not present

## 2011-04-14 DIAGNOSIS — Z95810 Presence of automatic (implantable) cardiac defibrillator: Secondary | ICD-10-CM

## 2011-04-14 DIAGNOSIS — I2510 Atherosclerotic heart disease of native coronary artery without angina pectoris: Secondary | ICD-10-CM | POA: Diagnosis not present

## 2011-04-14 DIAGNOSIS — I779 Disorder of arteries and arterioles, unspecified: Secondary | ICD-10-CM

## 2011-04-14 DIAGNOSIS — D126 Benign neoplasm of colon, unspecified: Secondary | ICD-10-CM

## 2011-04-14 DIAGNOSIS — R109 Unspecified abdominal pain: Secondary | ICD-10-CM | POA: Diagnosis present

## 2011-04-14 DIAGNOSIS — K219 Gastro-esophageal reflux disease without esophagitis: Secondary | ICD-10-CM

## 2011-04-14 DIAGNOSIS — R110 Nausea: Secondary | ICD-10-CM | POA: Diagnosis not present

## 2011-04-14 DIAGNOSIS — I447 Left bundle-branch block, unspecified: Secondary | ICD-10-CM | POA: Diagnosis present

## 2011-04-14 DIAGNOSIS — R251 Tremor, unspecified: Secondary | ICD-10-CM

## 2011-04-14 DIAGNOSIS — I5022 Chronic systolic (congestive) heart failure: Secondary | ICD-10-CM | POA: Diagnosis present

## 2011-04-14 DIAGNOSIS — I10 Essential (primary) hypertension: Secondary | ICD-10-CM

## 2011-04-14 DIAGNOSIS — R5381 Other malaise: Secondary | ICD-10-CM

## 2011-04-14 DIAGNOSIS — K430 Incisional hernia with obstruction, without gangrene: Principal | ICD-10-CM | POA: Diagnosis present

## 2011-04-14 DIAGNOSIS — N138 Other obstructive and reflux uropathy: Secondary | ICD-10-CM

## 2011-04-14 DIAGNOSIS — I509 Heart failure, unspecified: Secondary | ICD-10-CM | POA: Diagnosis not present

## 2011-04-14 DIAGNOSIS — R42 Dizziness and giddiness: Secondary | ICD-10-CM

## 2011-04-14 DIAGNOSIS — K436 Other and unspecified ventral hernia with obstruction, without gangrene: Secondary | ICD-10-CM

## 2011-04-14 DIAGNOSIS — N401 Enlarged prostate with lower urinary tract symptoms: Secondary | ICD-10-CM

## 2011-04-14 DIAGNOSIS — K5090 Crohn's disease, unspecified, without complications: Secondary | ICD-10-CM | POA: Diagnosis present

## 2011-04-14 DIAGNOSIS — J329 Chronic sinusitis, unspecified: Secondary | ICD-10-CM

## 2011-04-14 DIAGNOSIS — Z951 Presence of aortocoronary bypass graft: Secondary | ICD-10-CM

## 2011-04-14 DIAGNOSIS — I2589 Other forms of chronic ischemic heart disease: Secondary | ICD-10-CM | POA: Diagnosis present

## 2011-04-14 DIAGNOSIS — I998 Other disorder of circulatory system: Secondary | ICD-10-CM | POA: Diagnosis present

## 2011-04-14 DIAGNOSIS — E8779 Other fluid overload: Secondary | ICD-10-CM

## 2011-04-14 DIAGNOSIS — M353 Polymyalgia rheumatica: Secondary | ICD-10-CM

## 2011-04-14 DIAGNOSIS — K439 Ventral hernia without obstruction or gangrene: Secondary | ICD-10-CM | POA: Diagnosis not present

## 2011-04-14 DIAGNOSIS — T45515A Adverse effect of anticoagulants, initial encounter: Secondary | ICD-10-CM | POA: Diagnosis present

## 2011-04-14 DIAGNOSIS — K56609 Unspecified intestinal obstruction, unspecified as to partial versus complete obstruction: Secondary | ICD-10-CM

## 2011-04-14 DIAGNOSIS — K55069 Acute infarction of intestine, part and extent unspecified: Secondary | ICD-10-CM | POA: Diagnosis present

## 2011-04-14 DIAGNOSIS — R259 Unspecified abnormal involuntary movements: Secondary | ICD-10-CM

## 2011-04-14 DIAGNOSIS — I340 Nonrheumatic mitral (valve) insufficiency: Secondary | ICD-10-CM

## 2011-04-14 DIAGNOSIS — E559 Vitamin D deficiency, unspecified: Secondary | ICD-10-CM

## 2011-04-14 DIAGNOSIS — I4589 Other specified conduction disorders: Secondary | ICD-10-CM

## 2011-04-14 DIAGNOSIS — R001 Bradycardia, unspecified: Secondary | ICD-10-CM

## 2011-04-14 DIAGNOSIS — Z7901 Long term (current) use of anticoagulants: Secondary | ICD-10-CM | POA: Diagnosis present

## 2011-04-14 DIAGNOSIS — B029 Zoster without complications: Secondary | ICD-10-CM

## 2011-04-14 DIAGNOSIS — IMO0002 Reserved for concepts with insufficient information to code with codable children: Secondary | ICD-10-CM | POA: Diagnosis present

## 2011-04-14 DIAGNOSIS — I255 Ischemic cardiomyopathy: Secondary | ICD-10-CM | POA: Diagnosis present

## 2011-04-14 DIAGNOSIS — Z01810 Encounter for preprocedural cardiovascular examination: Secondary | ICD-10-CM | POA: Diagnosis not present

## 2011-04-14 DIAGNOSIS — K5010 Crohn's disease of large intestine without complications: Secondary | ICD-10-CM | POA: Diagnosis present

## 2011-04-14 HISTORY — DX: Unspecified abdominal pain: R10.9

## 2011-04-14 LAB — COMPREHENSIVE METABOLIC PANEL
ALT: 36 U/L (ref 0–53)
AST: 46 U/L — ABNORMAL HIGH (ref 0–37)
Alkaline Phosphatase: 68 U/L (ref 39–117)
BUN: 10 mg/dL (ref 6–23)
Calcium: 9.8 mg/dL (ref 8.4–10.5)
Creatinine, Ser: 0.9 mg/dL (ref 0.50–1.35)
GFR calc Af Amer: >90 mL/min (ref >90)
GFR calc non Af Amer: 80 mL/min — ABNORMAL LOW (ref >90)
Potassium: 4.5 mEq/L (ref 3.5–5.1)
Total Bilirubin: 4.9 mg/dL — ABNORMAL HIGH (ref 0.3–1.2)
Total Protein: 6.1 g/dL (ref 6.0–8.3)

## 2011-04-14 LAB — URINE MICROSCOPIC-ADD ON

## 2011-04-14 LAB — CBC
Hemoglobin: 14.7 g/dL (ref 13.0–17.0)
MCH: 35.9 pg — ABNORMAL HIGH (ref 26.0–34.0)
MCHC: 34.8 g/dL (ref 30.0–36.0)

## 2011-04-14 LAB — URINALYSIS, ROUTINE W REFLEX MICROSCOPIC
Glucose, UA: NEGATIVE mg/dL
Hgb urine dipstick: NEGATIVE
Protein, ur: 30 mg/dL — AB
Urobilinogen, UA: 1 mg/dL (ref 0.0–1.0)

## 2011-04-14 LAB — DIFFERENTIAL
Basophils Relative: 0 % (ref 0–1)
Eosinophils Absolute: 0 10*3/uL (ref 0.0–0.7)
Monocytes Absolute: 0.5 10*3/uL (ref 0.1–1.0)
Monocytes Relative: 8 % (ref 3–12)
Neutrophils Relative %: 80 % — ABNORMAL HIGH (ref 43–77)

## 2011-04-14 LAB — PROTIME-INR: Prothrombin Time: 26.9 seconds — ABNORMAL HIGH (ref 11.6–15.2)

## 2011-04-14 MED ORDER — DEXTROSE 5 % IV SOLN
INTRAVENOUS | Status: DC
  Administered 2011-04-14: via INTRAVENOUS

## 2011-04-14 MED ORDER — METOPROLOL TARTRATE 1 MG/ML IV SOLN
5.0000 mg | INTRAVENOUS | Status: DC | PRN
  Filled 2011-04-14: qty 5

## 2011-04-14 MED ORDER — ONDANSETRON HCL 4 MG/2ML IJ SOLN
INTRAMUSCULAR | Status: AC
  Filled 2011-04-14: qty 2

## 2011-04-14 MED ORDER — SODIUM CHLORIDE 0.9 % IV SOLN: INTRAVENOUS | Status: AC

## 2011-04-14 MED ORDER — ONDANSETRON HCL 4 MG/2ML IJ SOLN: 4.0000 mg | Freq: Four times a day (QID) | INTRAMUSCULAR | Status: DC | PRN

## 2011-04-14 MED ORDER — PHENOL 1.4 % MT LIQD
1.0000 | OROMUCOSAL | Status: DC | PRN
  Administered 2011-04-15: 1 via OROMUCOSAL
  Filled 2011-04-14: qty 177

## 2011-04-14 MED ORDER — ONDANSETRON HCL 4 MG PO TABS: 4.0000 mg | ORAL_TABLET | Freq: Four times a day (QID) | ORAL | Status: DC | PRN

## 2011-04-14 MED ORDER — SODIUM CHLORIDE 0.9 % IV SOLN
Freq: Once | INTRAVENOUS | Status: AC
  Administered 2011-04-14: 18:00:00 via INTRAVENOUS

## 2011-04-14 MED ORDER — ASPIRIN 300 MG RE SUPP
150.0000 mg | Freq: Every day | RECTAL | Status: DC
  Administered 2011-04-15 – 2011-04-17 (×3): 150 mg via RECTAL
  Filled 2011-04-14 (×5): qty 1

## 2011-04-14 MED ORDER — MORPHINE SULFATE 2 MG/ML IJ SOLN: 2.0000 mg | INTRAMUSCULAR | Status: DC | PRN

## 2011-04-14 MED ORDER — ALBUTEROL SULFATE (5 MG/ML) 0.5% IN NEBU: 2.5000 mg | INHALATION_SOLUTION | RESPIRATORY_TRACT | Status: DC | PRN

## 2011-04-14 MED ORDER — HYDROCORTISONE 2.5 % RE CREA
1.0000 "application " | TOPICAL_CREAM | Freq: Two times a day (BID) | RECTAL | Status: DC
  Administered 2011-04-16 – 2011-04-18 (×3): 1 via RECTAL
  Filled 2011-04-14: qty 28.35

## 2011-04-14 MED ORDER — ONDANSETRON HCL 4 MG/2ML IJ SOLN
4.0000 mg | Freq: Once | INTRAMUSCULAR | Status: AC
  Administered 2011-04-14: 4 mg via INTRAVENOUS
  Filled 2011-04-14: qty 2

## 2011-04-14 MED ORDER — SODIUM CHLORIDE 0.9 % IJ SOLN
3.0000 mL | Freq: Two times a day (BID) | INTRAMUSCULAR | Status: DC
  Administered 2011-04-14 – 2011-04-19 (×5): 3 mL via INTRAVENOUS

## 2011-04-14 MED ORDER — HYDROMORPHONE HCL PF 1 MG/ML IJ SOLN: 1.0000 mg | INTRAMUSCULAR | Status: AC | PRN

## 2011-04-14 MED ORDER — ONDANSETRON HCL 4 MG/2ML IJ SOLN: 4.0000 mg | Freq: Three times a day (TID) | INTRAMUSCULAR | Status: DC | PRN

## 2011-04-14 MED ORDER — GUAIFENESIN-DM 100-10 MG/5ML PO SYRP: 5.0000 mL | ORAL_SOLUTION | ORAL | Status: DC | PRN

## 2011-04-14 MED ORDER — IOHEXOL 300 MG/ML  SOLN
100.0000 mL | Freq: Once | INTRAMUSCULAR | Status: AC | PRN
  Administered 2011-04-14: 100 mL via INTRAVENOUS

## 2011-04-14 NOTE — ED Provider Notes (Signed)
6:50 PM I spoke with Dr Jackolyn Confer who will by with the patient in the ER.  Given the patient's multiple comorbidities he will be admitted to the hospitalist service. I spoke with [facility] who will admit  Ct Abdomen Pelvis W Contrast  04/14/2011  *RADIOLOGY REPORT*  Clinical Data: Abdominal pain  CT ABDOMEN AND PELVIS WITH CONTRAST  Technique:  Multidetector CT imaging of the abdomen and pelvis was performed following the standard protocol during bolus administration of intravenous contrast.  Contrast: 145m OMNIPAQUE IOHEXOL 300 MG/ML IJ SOLN  Comparison: None.  Findings: Early or partial small bowel obstruction pattern is present.  Proximal small bowel loops are dilated.  Contrast extends through small bowel to a right abdominal hernia.  Small bowel is herniated through the defect.  Fecal like material is inspissated in the small bowel at the level of the obstruction.  See image #38. Small bowel loops traverse back into the peritoneal space then into an additional hernia.  There is probably a level of obstruction in both of these hernia sacs.  Scar tissue surrounds the area.  The hernia is secondary to a defect in the anterior abdominal wall likely from multiple prior surgeries.  There is a dilated bowel loop in the lower mid abdomen on image 56 likely is a result of bowel anastomosis.  Distal small bowel and ileum are decompressed.  Ascending colon protrudes through the hernia defect but there is no evidence of colonic obstruction.  Mild edema involving the ascending colon as well as in the adjacent mesentery is likely related to the dilated small bowel loops rather than primary inflammation of the colon.  Small amount of free fluid is seen anterior to small bowel loops which are dilated on image 36.  There is no free intraperitoneal gas.  Post cholecystectomy.  Small amount of free fluid around the liver. Diffuse hepatic steatosis.  Pancreas, adrenal glands are within normal limits. Tiny hypodensity in the  posterior and medial spleen on image 20 of unknown significance.  Tiny liver hypodensity on image 14 of unknown significance.  Calcified granulomata in the liver.  Chronic changes of the kidneys.  Benign-appearing cyst in the upper pole of the right kidney.  Other hypodensities are too small to characterize  Penetrating atherosclerotic ulcer in the infrarenal aorta is noted on image 45.  Maximal aortic caliber at this level is 3.2 cm.  Mild ectasia of the right and left common iliac artery with caliber is of 1.8 and 1.8 cm respectively.  Portal vein is patent.  Gastroesophageal varices are identified of unknown significance.  Bladder is minimally distended.  Prostate is within normal limits.  Right rectus femoris lipoma.  IMPRESSION: The small bowel partial or early small bowel obstruction pattern secondary to and anterior abdominal wall hernia and wall defect as described.  There is inflammation within the right side the abdomen and anterior abdomen secondary to this process.  Gastroesophageal varices of unknown significance.  Chronic changes.  Original Report Authenticated By: AJamas Lav M.D.     KHoy Morn MD 04/14/11 [phone]

## 2011-04-14 NOTE — Patient Instructions (Signed)
We are sending you to Emergency Room by EMS

## 2011-04-14 NOTE — ED Notes (Signed)
Per EMS pt came from [facility], pt has been vomiting x 1-2 days, yellow bile, having abd pain and a severe right abd hernia.

## 2011-04-14 NOTE — ED Notes (Signed)
Surgeon at bedside with patient.

## 2011-04-14 NOTE — ED Provider Notes (Signed)
History     CSN: [id number]  Arrival date & time 04/14/11  70   First MD Initiated Contact with Patient 04/14/11 1324      Chief Complaint  Patient presents with  . Abdominal Pain  . 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 Pt states yesterday he experienced episode of diaphoresis and "generalized discomfort". States that today he began to experienced R abdominal pain at site of ventral hernia. Multiple episodes of bilious emesis. Wife states that he appears yellow in color. Denies fever/chills. No diarrhea. Denies chest pain/SOB. Has been compliant with coumadin.  H/O Crohns. H/O multiple abdominal surgeries including colostomy and ostomy takedown, cholecystectomy.  No known h/o cancer   ED Notes, ED Provider Notes from 04/14/11 0000 to 04/14/11 13:24:09       Wynn Banker, RN 04/14/2011 13:22      Per EMS pt came from [facility], pt has been vomiting x 1-2 days, yellow bile, having abd pain and a severe right abd hernia.         Otilio Saber, RN 04/14/2011 13:22      LNL:[id number]  Expected date:04/14/11  Expected time:  Means of arrival:  Comments:  EMS 53 GC - abd pain/vomiting     Past Medical History  Diagnosis Date  . Dizziness     Positional dizziness  . Hypertension   . Ischemic cardiomyopathy     CABG 1994 CAD catherization 1996.[name] occluded vein graft to the diagnol, other grafts patent/  catherization 2006, no PCI/ nuclear.[name] october 2011.[name] extensive scar anteroseptal and apical.. no ischemia    . CHF (congestive heart failure)     chronic systolic  . Mitral regurgitation     mild.[name] echo november 2011  EF 35%...echo.. november 2009/ ef 35%.[name] echo november 2011  . Sinus bradycardia   .  Chronotropic incompetence   . Biventricular implantable cardiac defibrillator in situ     GDT CONTAK H170-MADIT-CRT-EXPLANTED 2011 implanted defibrillator- [employer] cognis, model n119-2001 Dr. Caryl Comes (11/28/2009)  . LBBB (left bundle branch block)   . Hyperlipemia   . GERD (gastroesophageal reflux disease)   . Colonic polyp   . Crohn's disease   . Acute vascular insufficiency of intestine     Mesenteric embolus...surgery 2003  . Ventral hernia   . Hypertrophy of prostate with urinary obstruction and other lower urinary tract symptoms (LUTS)   . Degenerative joint disease   . Polymyalgia rheumatica   . Tremor   . Anxiety   . Shingles   . Vitamin B12 deficiency   . CAD (coronary artery disease)     CABG 1994  /   catheterization 1996, occluded vein graft to the diagonal, other grafts patent  /   catheterization 2006, no PCI  /  nuclear, October, 2011, extensive scar anteroseptal and apical, no ischemia  . Hx of CABG     1994  . Ejection fraction     EF 35%, echo, November, 2011  . Warfarin anticoagulation     Coumadin therapy for mesenteric embolus 2003  . Carotid artery disease     Doppler, February,  2012, 0-39% bilateral, recommended followup 1 year    Past Surgical History  Procedure Date  . Coronary artery bypass graft 1994  . Laparoscopic cholecystectomy 2002  . Elap w/ superior mesenteric  artery embolectomy 1/03    by Dr. Jennette Banker  . Repair of incarcerated ventral hernia and lysis of adhesions 11/03    by Dr. Betsy Pries  . Implantation of biventric cardioverter-defibrillatorr     by Dr. Lovena Le in 8/06 [employer] [place] 2011  . Implantation of [employer]ant cognis device     model [id number]  . Left inguinal hernia repair with mesh 6/08    by Dr. Betsy Pries    Family History  Problem Relation Age of Onset  . Heart disease    . Hypertension    . Hyperlipidemia    . Tremor Sister     History  Substance Use Topics  . Smoking status: Former Smoker -- 1.0 packs/day  for 20 years    Types: Cigarettes    Quit date: 01/31/1973  . Smokeless tobacco: Never Used  . Alcohol Use: Yes    Review of Systems  All other systems reviewed and are negative.   except as noted HPI   Allergies  Review of patient's allergies indicates no known allergies.  Home Medications   Current Outpatient Rx  Name Route Sig Dispense Refill  . ASPIRIN 81 MG PO TABS Oral Take 81 mg by mouth daily.      [name] CARVEDILOL 6.25 MG PO TABS Oral Take 1 tablet (6.25 mg total) by mouth 2 (two) times daily with a meal. 1 tablet twice a day 180 tablet 3  . VITAMIN D 1000 UNITS PO TABS Oral Take 1,000-2,000 Units by mouth daily. 1-2 tablets once a day    . B-12 1000 MCG PO CAPS Oral Take 1 capsule by mouth daily.      [name] FOLIC ACID 1 MG PO TABS Oral Take 1 mg by mouth daily.      . FUROSEMIDE 40 MG PO TABS Oral Take 1 tablet (40 mg total) by mouth daily. One tablet once a day 90 tablet 3  . HYDROCORTISONE 2.5 % RE CREA Rectal Place 1 application rectally 2 (two) times daily. Use as directed    . LISINOPRIL 10 MG PO TABS Oral Take 1 tablet (10 mg total) by mouth daily. 90 tablet 3  . MERCAPTOPURINE 50 MG PO TABS Oral Take 75 mg by mouth daily. Take 1.5 tabs by mouth once daily as directed by Dr. Su Ley    . SIMVASTATIN 20 MG PO TABS Oral Take 1 tablet (20 mg total) by mouth at bedtime. 90 tablet 3  . SPIRONOLACTONE 25 MG PO TABS Oral Take 1 tablet (25 mg total) by mouth daily. One tablet once a day 90 tablet 3  . WARFARIN SODIUM 5 MG PO TABS Oral Take 15-20 mg by mouth daily. TAKES 20MG ON Monday,WEDNESDAY AND Friday,AND TAKES 15MG Tuesday Thursday Saturday AND Sunday      BP 124/76  Pulse 60  Temp(Src) 97.4 F (36.3 C) (Oral)  Resp 18  SpO2 97%  Physical Exam  Nursing note and vitals reviewed. Constitutional: He is oriented to person, place, and time. He appears well-developed and well-nourished. No distress.  HENT:  Head: Atraumatic.  Mouth/Throat: Oropharynx is clear and  moist.  Eyes: Conjunctivae are normal. Pupils are equal, round, and reactive to light.  Neck: Neck supple.  Cardiovascular: Normal rate, regular rhythm, normal heart sounds and intact distal pulses.  Exam reveals no gallop and no friction rub.   No murmur heard. Pulmonary/Chest: Effort normal. No respiratory distress. He has no wheezes. He has no rales.  Abdominal: Soft. Bowel sounds are normal. He exhibits mass.  There is no tenderness. There is no rebound and no guarding.       Well healed central abdominal scarring R large hernia, reducible with pain. Moderate overlying ttp, no color changes. Min epigastric ttp  Musculoskeletal: Normal range of motion. He exhibits edema. He exhibits no tenderness.  Neurological: He is alert and oriented to person, place, and time.  Skin: Skin is warm and dry.  Psychiatric: He has a normal mood and affect.    ED Course  Procedures (including critical care time)  Labs Reviewed  CBC - Abnormal; Notable for the following:    RBC 4.09 (*)    MCV 103.2 (*)    MCH 35.9 (*)    RDW 16.1 (*)    Platelets 77 (*)    All other components within normal limits  DIFFERENTIAL - Abnormal; Notable for the following:    Neutrophils Relative 80 (*)    Lymphocytes Relative 11 (*)    All other components within normal limits  COMPREHENSIVE METABOLIC PANEL - Abnormal; Notable for the following:    CO2 33 (*)    Glucose, Bld 154 (*)    Albumin 3.4 (*)    AST 46 (*)    Total Bilirubin 4.9 (*)    GFR calc non Af Amer 80 (*)    All other components within normal limits  LIPASE, BLOOD - Abnormal; Notable for the following:    Lipase 147 (*)    All other components within normal limits  URINALYSIS, ROUTINE W REFLEX MICROSCOPIC - Abnormal; Notable for the following:    Color, Urine ORANGE (*) BIOCHEMICALS MAY BE AFFECTED BY COLOR   APPearance CLOUDY (*)    Bilirubin Urine SMALL (*)    Ketones, ur TRACE (*)    Protein, ur 30 (*)    Leukocytes, UA SMALL (*)    All  other components within normal limits  PROTIME-INR - Abnormal; Notable for the following:    Prothrombin Time 26.9 (*)    INR 2.44 (*)    All other components within normal limits  URINE MICROSCOPIC-ADD ON - Abnormal; Notable for the following:    Casts HYALINE CASTS (*)    All other components within normal limits   No results found.   No diagnosis found.   MDM  47yoM h/o multiple medical problems pw abdominal pain at site of hernia/N/V and jaundiced on exam. States pain controlled (without intervention). Appears mildly dehydrated. Gentle IVF given h/o CHF. Hernia easily reducible. DDx includes mass/obstructive jaundice,  choledocholithiasis, SBO, pancreatitis, Crohn's flare, intra abdominal infection. Less likely ischemia- has h/o SMA thrombus but on coumadin. Check labs. Zofran. CT A/P possible RUQ U/S. He is s/p cholecystectomy. Anticipate admission.  Elevated tbili from baseline (Gilbert's)--possible dehydration or stress response?. Lipase slightly elevated. INR therapeutic. CT A/P pending.  Patient signed out to Dr. Venora Maples. CT A/P pending.        Blair Heys, MD 04/14/11 1737

## 2011-04-14 NOTE — Progress Notes (Signed)
Subjective:    Patient ID: [name], male    DOB: 12/16/1931, 78 y.o.   MRN: [id number]  HPI  78 y/o WM    ~  [place]owdhury [place] w/ sm bowel Crohn's- s/p mult resections w/ complications, stable on 6-MP for prevention of recurrence... also has sl incr fasting bili c/w Gilbert's... ~  followed by [name] for Cards- w/ CAD, s/p CABG, Cardiomyopathy w/ AICD- stable... f/u 2DEcho 11/09 showed diffuseHK w/ EF= 35% & DD, mod incr AoV thickness w/o AS... EKG= pacer rhythm.  ~  July 21, 2009:  he saw [facility] 6/11 w/ some incr edema & SPIRONOLACTONE 12.87m/d started & KCl stopped... he notes dizziness improved, still w/ mult somatic complaints & we discussed holding tight for now.  ~  November 18, 2009:  he has maintained regular f/u w/ Cards- DBenay Spice8/11 & DrKlein 10/11- pacer generator near EOL and pt noted decr energy "I'm slowing down, harder to get going";  they plan Myoview 1st, then device generator replacement... he is due for a follow up appt w/ DrChowdury as well... due for Flu shot today & f/u fasting labs (see below)...  ~  May 19, 2010:  612moOV & he notes that his Crohn's has been acting up- followed by GI [place] on 6MP & ?cholestyramine added?  He wants Prednisone restarted & I asked him to call GI- now DrHolmes to discuss, in the meanwhile we will give him a Pred taper, but he understands that a regular dose of this med requires GI to follow response;      He saw DrBenay Spice/12 & DrKlein 12/11- cardiomyop stable (extensive old scar, no ischemia, EF=34%), CDopplers no change, AICD replaced 10/11 & then reprogrammed for chronotropic incompetence, Coreg decr to 6.25Bid...  He had labs 10/11 & these are reviewed w/ pt & wife...  ~  November 24, 2010:  80m43moV & he has been generally stable, notes some discomfort in his lower back & left hip area w/ occas radiation down left leg; we discussed heat, Advil, exercises, and the need for Ortho eval if the discomfort persists or  worsens...     HBP, CAD, Cardiomyop> on Coreg6.25Bid, Lisinopril10, Lasix40, Spironolactone25 & Coumadin; BP= 126/68 & he denies CP, palpit, dizzy, ch in SOB, edema, etc; he saw DrKlein 7/12- felt to be stable on meds & defibrillator interrogated, no changes made...    Chol> on Simva20 & f/u FLP looks good, at goal, continue same...    GI> Gerd, Polyps, Crohn's> on 6MP [place]owdhury [place]; stable, continue same rx...    Hx intestinal vasc insuffic 2003> he remains on Coumadin followed in the [facility] & stable...    GU> BPH followed by DrNesi & prev on Flomax but he stopped & states voiding is satis etc...    DJD/ ?PMR> off Pred, he states he read where this "comes from crohn's" he says; see above & he will try Heat, Advil, Tylenol...    Tremor/ Anxiety> prev on Klonopin 0.70m61m from us &[place], DrYan, but he stopped on his own...  04/14/2011 Acute OV  Pt presents for an acute office visit. Complains of severe  n/v , clammy/sweats and abdominal pain  on right side x1 day with pain and hard to touch. Has hx of multiple abdominal hernias. +fever and chills. Very weak.  Vomiting up black and green emesis. . HeMarland Kitchenis very weak and appears jaundiced on exam table. Appears very ill.  Has a hx of  left inguinal hernia repair 6/08 by Clarita Leber, and had a prev incarcerated ventral hernia repaired 11/03... his residual/ recurrent ventral hernia has been evaluated by Clarita Leber who feels that he would need eval in [place] if this hernia comes to surg .         Problem List:  HBP, CAD, Cardiomyopathy - followed by [name] and DrKlein... he had CABG in 1994 (with cath in 96 revealing a total right and an 80% OM, vein graft to the diagonal was occluded, other bypasses were patent), Biv AICD in 2006...  On COUMADIN (follow in clinic), ASA 54m/d,  COREG 6.278mid,  LISINOPRIL 2074m,  LASIX 11m66m  SPIRONOLACTONE 25mg16m. ~  Cardiolite 3/03 w/ EF=37%...  ~  Cath 6/06 w/ native vessel and graft disease...  ~  2DEcho  11/09 stable w/ mod LVD- diffuse HK w/ EF= 35%, DD, mod incr AoV thickness w/o AS...  ~  3/11:  CXR showed stable CABG, AICD, & chr changes... ~  10/11:  Myoview showed extensive scar, no ischemia, EF= 34% ~  10/11:  New AICD placed by DrKlein... ~  11/11:  2DEcho showed mod reduced LV sys function w/ EF= 30-35%, diffuse HK, gr1 DD> see report...  HYPERLIPIDEMIA - he takes SIMVASTATIN 20mg/13m he's had min, non-progressive incr LFTs noted. ~  FLP 4/Evansv[place]howed TChol 138, TG 138, HDL 38, LDL 72... tolerating med well. ~  FLP 4/Pine[place]howed TChol 118, TG 111, HDL 33, LDL 63 ~  FLP 12/09 showed TChol 118, TG 100, HDL 31, LDL 67 ~  FLP 10/10 on Simva20 showed TChol 111, TG 139, HDL 36, LDL 47 ~  FLP 10/11 on Simva20 showed TChol 101, TG 102, HDL 35, LDL 45 ~  FLP 10/12 on Simva20 showed TChol 124, TG 109, HDL 49, LDL 54  GERD (ICD-530.81) - no active symptoms and not currently taking PPI etc...  COLONIC POLYPS (ICD-211.3) - last colonoscopy here was 7/04 by DrKaplDemetra Shinervertics and several polyps from 6 - 20mm s49m reportedly adenomatous on bx but I cannot find the path reports... he is overdue for f/u colon... ~  labs 2/10 showed Hg= 15.7... we wMarland KitchenMarland Kitchenl send copy of note to DrChowdhury w/ request for f/u colonoscopy. ~  f/u colonoscopy 11/10 by DrChowdhury showed norm TI & several polyps removed- path +tubular adenoma...  CROHN'S DISEASE (ICD-555.9) - he is [place]Chowdhury [place] 6-MP 50mg ta62m1.5 tabs/d... he notes intermittent diarrhea and was started on Questran Prn... ~  labs 3/11 noted sl incr LFTs, Protime, & Sed... ~  labs 10/11 showed norm chems & LFTs x incr bili, Plat=88K, BNP=219, otherw norm. ~  Labs 10/12 showed norm chems, SGOT=59, TBili=3.9, Plat=85K...  Hx of ACUTE VASCULAR INSUFFICIENCY OF INTESTINE (ICD-557.0) - he has embolectomy of the superior mesenteric artery 1/03 by DrLawsonSheryn Bisonospitalization w/ complications)... he has remained on COUMADIN in the coumadin  clinic ever since then...  VENTRAL HERNIA (ICD-553.20) - he is s/p left inguinal hernia repair 6/08 by DrIngramClarita Leberd a prev incarcerated ventral hernia repaired 11/03... his residual/ recurrent ventral hernia has been evaluated by DrIngramClarita Leberls that [place] would need [place] comes to surg... currently he wears an abd binder when up and about...  HYPERTROPHY PROSTATE W/UR OBST & OTH LUTS (ICD-600.01) - saw DrNesi 7/10 w/ BPH, obstructive symptoms, and ED- given  Flomax & "shots"...  ~  Labs 10/12 showed PSA= 2.93  DEGENERATIVE JOINT DISEASE (  ICD-715.90)  Hx of POLYMYALGIA RHEUMATICA (ICD-725) - hasn't req steroids x yrs... ~  labs 2/10 showed Sed= 8 ~  labs 10/10 showed Sed= 6 ~  labs 3/11 showed Sed= 51 w/ acute illness. ~  labs 10/11 showed Sed= 9 ~  Labs 10/12 showed Sed= 9  TREMOR (ICD-781.0) - [facility]- essential tremor w/ +FamHx in sister... trial Clonazepam 0.25m Bid w/ improvement...  ANXIETY (ICD-300.00) - he uses the same Clonazepam 0.246mPrn...  Hx of SHINGLES (ICD-053.9)  Thrombocytopenia >> serial labs w/ Plat counts betw 223 ==> 85; no bleeding diathesis, & he had acute intestinal vasc insuffic 2003 requiring embolectomy & has been on Coumadin Rx ever since then... ~    VITAMIN B12 DEFICIENCY (ICD-266.2) - on B 12 100034md OTC... ~  labs 2/10 showed B12 level = 215... rec start oral B12 supplement daily. ~  labs 10/10 showed B12 level = 416... continue oral B12 supplement. ~  labs 10/11 showed B12= 592... continue same. ~  Labs 10/12 showed B12= 573  VITAMIN D DEFICIENCY (ICD-268.9) - on Vit D 1000 u daily OTC... ~  labs 2/10 showed Vit D level = 23... rec> start Vit D 1000 u daily.. ~  Labs 10/12 showed Vit D level = 50   Past Surgical History  Procedure Date  . Coronary artery bypass graft 1994  . Laparoscopic cholecystectomy 2002  . Elap w/ superior mesenteric artery embolectomy 1/03    by Dr.  LawJennette Banker Repair of incarcerated ventral hernia and lysis of adhesions 11/03    by Dr. HinBetsy Pries Implantation of biventric cardioverter-defibrillatorr     by Dr. TayLovena Le 8/06 [employer]  . Implantation of guidant cognis device     model [id number] Left inguinal hernia repair with mesh 6/08    by Dr. HinBetsy Pries Outpatient Encounter Prescriptions as of 04/14/2011  Medication Sig Dispense Refill  . aspirin 81 MG tablet Take 81 mg by mouth daily.        . carvedilol (COREG) 6.25 MG tablet Take 1 tablet (6.25 mg total) by mouth 2 (two) times daily with a meal. 1 tablet twice a day  180 tablet  3  . cholecalciferol (VITAMIN D) 1000 UNITS tablet 1-2 tablets once a day       . Cyanocobalamin (B-12) 1000 MCG CAPS Take 1 capsule by mouth daily.        . folic acid (FOLVITE) 1 MG tablet Take 1 mg by mouth daily.        . furosemide (LASIX) 40 MG tablet Take 1 tablet (40 mg total) by mouth daily. One tablet once a day  90 tablet  3  . hydrocortisone (ANUSOL-HC) 2.5 % rectal cream Use as directed  30 g  11  . lisinopril (PRINIVIL,ZESTRIL) 10 MG tablet Take 1 tablet (10 mg total) by mouth daily.  90 tablet  3  . mercaptopurine (PURINETHOL) 50 MG tablet Take 1.5 tabs by mouth once daily as directed by Dr. ChoSu Ley    . simvastatin (ZOCOR) 20 MG tablet Take 1 tablet (20 mg total) by mouth at bedtime.  90 tablet  3  . spironolactone (ALDACTONE) 25 MG tablet Take 1 tablet (25 mg total) by mouth daily. One tablet once a day  90 tablet  3  . warfarin (COUMADIN) 5 MG tablet Take as directed by anticoagulation clinic  360 tablet  1    No Known  Allergies   Current Medications, Allergies, Past Medical History, Past Surgical History, Family History, and Social History were reviewed in [employer] record.    Review of Systems         Constitutional:   No  weight loss, night sweats,  + Fevers, chills, fatigue, or  lassitude.  HEENT:   No headaches,   Difficulty swallowing,  Tooth/dental problems, or  Sore throat,                No sneezing, itching, ear ache, nasal congestion, post nasal drip,   CV:  No chest pain,  Orthopnea, PND, swelling in lower extremities, anasarca, dizziness, palpitations, syncope.   GI  No heartburn, indigestion, + abdominal pain, nausea, vomiting,  loss of appetite,   Resp: No shortness of breath with exertion or at rest.  No excess mucus, no productive cough,  No non-productive cough,  No coughing up of blood.  No change in color of mucus.  No wheezing.  No chest wall deformity  Skin: no rash or lesions.  GU: no dysuria, change in color of urine, no urgency or frequency.  No flank pain, no hematuria   MS:  No joint pain or swelling.  No decreased range of motion.  No back pain.  Psych:  No change in mood or affect. No depression or anxiety.  No memory loss.       Objective:   Physical Exam     WD, WN, 78 y/o WM appears very ill  GENERAL:  Alert & oriented;   HEENT:  /AT  EACs-clear, TMs-wnl, NOSE- clear THROAT-clear & wnl., dry oral mucosa  Sclera icteric  NECK:  Supple w/ fairROM; no JVD; normal carotid impulses w/o bruits; no thyromegaly or nodules palpated; no lymphadenopathy. CHEST:  Decr BS w , no wheezing, no rales, no signs of consolidation. HEART:  Regular Rhythm; gr 1/6 SEM without rubs or gallops detected.Marland KitchenMarland KitchenLeft upper chest wall with defib/pacemaker  ABDOMEN:  Soft & gen tenderness,no rebound ; mult scars, & large abdominal hernia-unable to reduce on Right  , +BS . EXT: without deformities, mod arthritic changes; no varicose veins/ venous insuffic/ or edema. NEURO:   no focal neuro deficits... DERM:  Appears Jaundiced    Assessment & Plan:

## 2011-04-14 NOTE — ED Notes (Signed)
Admitting RN at bedside with patient.

## 2011-04-14 NOTE — Consult Note (Signed)
[facility], [place]  Reason for Consult:  Small bowel obstruction, ventral incisional herniae  Referring Physician: Dr. Jola Schmidt, [facility] Emergency Department  [name] is an 78 y.o. male.   HPI: Patient is a 78 yo white male with an extensive and complex past surgical history.  Dr. Fanny Skates has provided the majority of his general surgery services in the past.  Patient developed abdominal discomfort yesterday afternoon.  He had a normal BM in the AM yesterday.  Overnight patient became more uncomfortable and then developed nausea and emesis this AM.  Patient noted that hernia in right mid abdominal wall was hard and painful.  Presented to [facility] ER.  CT scan results noted below showed obstruction at level of hernia in right abdominal wall.  Hernia was successfully reduced by ER MD and a nasogastric tube was placed.  Patient is being admitted to medical service due to multiple co-morbidities.  Surgery is asked to evaluate and follow for SBO and ventral herniae.  Past Medical History  Diagnosis Date  . Dizziness     Positional dizziness  . Hypertension   . Ischemic cardiomyopathy     CABG 1994 CAD catherization 1996.[name] occluded vein graft to the diagnol, other grafts patent/  catherization 2006, no PCI/ nuclear.[name] october 2011.[name] extensive scar anteroseptal and apical.. no ischemia    . CHF (congestive heart failure)     chronic systolic  . Mitral regurgitation     mild.[name] echo november 2011  EF 35%...echo.. november 2009/ ef 35%.[name] echo november 2011  . Sinus bradycardia   . Chronotropic incompetence   . Biventricular implantable cardiac defibrillator in situ     GDT CONTAK H170-MADIT-CRT-EXPLANTED 2011 implanted defibrillator- Guidant cognis, model n119-2001 Dr. Caryl Comes (11/28/2009)  . LBBB (left bundle branch block)   . Hyperlipemia   . GERD (gastroesophageal reflux disease)   . Colonic polyp   . Crohn's disease   . Acute vascular  insufficiency of intestine     Mesenteric embolus...surgery 2003  . Ventral hernia   . Hypertrophy of prostate with urinary obstruction and other lower urinary tract symptoms (LUTS)   . Degenerative joint disease   . Polymyalgia rheumatica   . Tremor   . Anxiety   . Shingles   . Vitamin B12 deficiency   . CAD (coronary artery disease)     CABG 1994  /   catheterization 1996, occluded vein graft to the diagonal, other grafts patent  /   catheterization 2006, no PCI  /  nuclear, October, 2011, extensive scar anteroseptal and apical, no ischemia  . Hx of CABG     1994  . Ejection fraction     EF 35%, echo, November, 2011  . Warfarin anticoagulation     Coumadin therapy for mesenteric embolus 2003  . Carotid artery disease     Doppler, February,  2012, 0-39% bilateral, recommended followup 1 year    Past Surgical History  Procedure Date  . Coronary artery bypass graft 1994  . Laparoscopic cholecystectomy 2002  . Elap w/ superior mesenteric artery embolectomy 1/03    by Dr. Jennette Banker  . Repair of incarcerated ventral hernia and lysis of adhesions 11/03    by Dr. Betsy Pries  . Implantation of biventric cardioverter-defibrillatorr     by Dr. Lovena Le in 8/06 [employer] [place] 2011  . Implantation of guidant cognis device     model [id number]  . Left inguinal hernia repair with mesh 6/08  by Dr. Betsy Pries    Family History  Problem Relation Age of Onset  . Heart disease    . Hypertension    . Hyperlipidemia    . Tremor Sister     Social History:  reports that he quit smoking about 38 years ago. His smoking use included Cigarettes. He has a 20 pack-year smoking history. He has never used smokeless tobacco. He reports that he drinks alcohol. He reports that he does not use illicit drugs.  Allergies: No Known Allergies  Medications: see H&P, [facility] Dr. Lenna Gilford  Results for orders placed during the hospital encounter of 04/14/11 (from the past 48  hour(s))  CBC     Status: Abnormal   Collection Time   04/14/11  2:17 PM      Component Value Range Comment   WBC 6.5  4.0 - 10.5 (K/uL)    RBC 4.09 (*) 4.22 - 5.81 (MIL/uL)    Hemoglobin 14.7  13.0 - 17.0 (g/dL)    HCT 42.2  39.0 - 52.0 (%)    MCV 103.2 (*) 78.0 - 100.0 (fL)    MCH 35.9 (*) 26.0 - 34.0 (pg)    MCHC 34.8  30.0 - 36.0 (g/dL)    RDW 16.1 (*) 11.5 - 15.5 (%)    Platelets 77 (*) 150 - 400 (K/uL)   DIFFERENTIAL     Status: Abnormal   Collection Time   04/14/11  2:17 PM      Component Value Range Comment   Neutrophils Relative 80 (*) 43 - 77 (%)    Neutro Abs 5.2  1.7 - 7.7 (K/uL)    Lymphocytes Relative 11 (*) 12 - 46 (%)    Lymphs Abs 0.7  0.7 - 4.0 (K/uL)    Monocytes Relative 8  3 - 12 (%)    Monocytes Absolute 0.5  0.1 - 1.0 (K/uL)    Eosinophils Relative 1  0 - 5 (%)    Eosinophils Absolute 0.0  0.0 - 0.7 (K/uL)    Basophils Relative 0  0 - 1 (%)    Basophils Absolute 0.0  0.0 - 0.1 (K/uL)   COMPREHENSIVE METABOLIC PANEL     Status: Abnormal   Collection Time   04/14/11  2:17 PM      Component Value Range Comment   Sodium 137  135 - 145 (mEq/L)    Potassium 4.5  3.5 - 5.1 (mEq/L)    Chloride 100  96 - 112 (mEq/L)    CO2 33 (*) 19 - 32 (mEq/L)    Glucose, Bld 154 (*) 70 - 99 (mg/dL)    BUN 10  6 - 23 (mg/dL)    Creatinine, Ser 0.90  0.50 - 1.35 (mg/dL)    Calcium 9.8  8.4 - 10.5 (mg/dL)    Total Protein 6.1  6.0 - 8.3 (g/dL)    Albumin 3.4 (*) 3.5 - 5.2 (g/dL)    AST 46 (*) 0 - 37 (U/L)    ALT 36  0 - 53 (U/L)    Alkaline Phosphatase 68  39 - 117 (U/L)    Total Bilirubin 4.9 (*) 0.3 - 1.2 (mg/dL)    GFR calc non Af Amer 80 (*) >90 (mL/min)    GFR calc Af Amer >90  >90 (mL/min)   LIPASE, BLOOD     Status: Abnormal   Collection Time   04/14/11  2:17 PM      Component Value Range Comment   Lipase 147 (*)  11 - 59 (U/L)   PROTIME-INR     Status: Abnormal   Collection Time   04/14/11  2:17 PM      Component Value Range Comment   Prothrombin Time 26.9 (*)  11.6 - 15.2 (seconds)    INR 2.44 (*) 0.00 - 1.49    URINALYSIS, ROUTINE W REFLEX MICROSCOPIC     Status: Abnormal   Collection Time   04/14/11  3:10 PM      Component Value Range Comment   Color, Urine ORANGE (*) YELLOW  BIOCHEMICALS MAY BE AFFECTED BY COLOR   APPearance CLOUDY (*) CLEAR     Specific Gravity, Urine 1.023  1.005 - 1.030     pH 6.0  5.0 - 8.0     Glucose, UA NEGATIVE  NEGATIVE (mg/dL)    Hgb urine dipstick NEGATIVE  NEGATIVE     Bilirubin Urine SMALL (*) NEGATIVE     Ketones, ur TRACE (*) NEGATIVE (mg/dL)    Protein, ur 30 (*) NEGATIVE (mg/dL)    Urobilinogen, UA 1.0  0.0 - 1.0 (mg/dL)    Nitrite NEGATIVE  NEGATIVE     Leukocytes, UA SMALL (*) NEGATIVE    URINE MICROSCOPIC-ADD ON     Status: Abnormal   Collection Time   04/14/11  3:10 PM      Component Value Range Comment   Squamous Epithelial / LPF RARE  RARE     WBC, UA 3-6  <3 (WBC/hpf)    RBC / HPF 0-2  <3 (RBC/hpf)    Bacteria, UA RARE  RARE     Casts HYALINE CASTS (*) NEGATIVE      Ct Abdomen Pelvis W Contrast  04/14/2011  *RADIOLOGY REPORT*  Clinical Data: Abdominal pain  CT ABDOMEN AND PELVIS WITH CONTRAST  Technique:  Multidetector CT imaging of the abdomen and pelvis was performed following the standard protocol during bolus administration of intravenous contrast.  Contrast: 112m OMNIPAQUE IOHEXOL 300 MG/ML IJ SOLN  Comparison: None.  Findings: Early or partial small bowel obstruction pattern is present.  Proximal small bowel loops are dilated.  Contrast extends through small bowel to a right abdominal hernia.  Small bowel is herniated through the defect.  Fecal like material is inspissated in the small bowel at the level of the obstruction.  See image #38. Small bowel loops traverse back into the peritoneal space then into an additional hernia.  There is probably a level of obstruction in both of these hernia sacs.  Scar tissue surrounds the area.  The hernia is secondary to a defect in the anterior abdominal  wall likely from multiple prior surgeries.  There is a dilated bowel loop in the lower mid abdomen on image 56 likely is a result of bowel anastomosis.  Distal small bowel and ileum are decompressed.  Ascending colon protrudes through the hernia defect but there is no evidence of colonic obstruction.  Mild edema involving the ascending colon as well as in the adjacent mesentery is likely related to the dilated small bowel loops rather than primary inflammation of the colon.  Small amount of free fluid is seen anterior to small bowel loops which are dilated on image 36.  There is no free intraperitoneal gas.  Post cholecystectomy.  Small amount of free fluid around the liver. Diffuse hepatic steatosis.  Pancreas, adrenal glands are within normal limits. Tiny hypodensity in the posterior and medial spleen on image 20 of unknown significance.  Tiny liver hypodensity on image 14 of unknown significance.  Calcified granulomata in the liver.  Chronic changes of the kidneys.  Benign-appearing cyst in the upper pole of the right kidney.  Other hypodensities are too small to characterize  Penetrating atherosclerotic ulcer in the infrarenal aorta is noted on image 45.  Maximal aortic caliber at this level is 3.2 cm.  Mild ectasia of the right and left common iliac artery with caliber is of 1.8 and 1.8 cm respectively.  Portal vein is patent.  Gastroesophageal varices are identified of unknown significance.  Bladder is minimally distended.  Prostate is within normal limits.  Right rectus femoris lipoma.  IMPRESSION: The small bowel partial or early small bowel obstruction pattern secondary to and anterior abdominal wall hernia and wall defect as described.  There is inflammation within the right side the abdomen and anterior abdomen secondary to this process.  Gastroesophageal varices of unknown significance.  Chronic changes.  Original Report Authenticated By: Jamas Lav, M.D.   ROS Patient with nausea & emesis,  improved since NG placement; abdominal pain now resolved  EXAM Blood pressure 111/64, pulse 60, temperature 97.4 F (36.3 C), temperature source Oral, resp. rate 18, SpO2 94.00%. HEENT - normocephalic, atraumatic; sclerae clear; NG in left nares Neck - soft, no mass, airway midline Chest - good BS bilat; no wheeze Cor - RRR Abd - protuberant, active BS present; multiple herniae in midline, right mid abdomen, left lower abdomen; broad flat scar in midline; no tenderness; all herniae reducible Ext - non-tender without significant edema Neuro - alert, responsive, oriented;  No focal deficits on gross exam  Assessment: 1.  Small bowel obstruction, likely secondary to herniae and adhesions from prior surgical procedures 2.  Multiple ventral incisional herniae, reducible 3.  Extensive, multiple complex medical problems noted above and in H&P note  PLAN: Agree with admission to medical service.  Will follow closely with you for small bowel obstruction.  Agree with NG decompression.  Would keep NPO for now and allow ice chips for comfort.   OOB as tolerated.  Will get follow up plain AXR in AM 3/15.  Hold anticoagulation for now in case operative intervention becomes a necessity.  Would like to avoid operative intervention given significantly increased medical risk and complex nature of abdominal wall reconstruction.  Will follow closely with you.  Earnstine Regal, MD, [facility] Office: [place] 04/14/2011, 7:37 PM

## 2011-04-14 NOTE — H&P (Signed)
[name] [phone]  Outpatient Primary MD for the patient is Noralee Space, MD, MD  With History of -  Past Medical History  Diagnosis Date  . Dizziness     Positional dizziness  . Hypertension   . Ischemic cardiomyopathy     CABG 1994 CAD catherization 1996.[name] occluded vein graft to the diagnol, other grafts patent/  catherization 2006, no PCI/ nuclear.[name] october 2011.[name] extensive scar anteroseptal and apical.. no ischemia    . CHF (congestive heart failure)     chronic systolic  . Mitral regurgitation     mild.[name] echo november 2011  EF 35%...echo.. november 2009/ ef 35%.[name] echo november 2011  . Sinus bradycardia   . Chronotropic incompetence   . Biventricular implantable cardiac defibrillator in situ     GDT CONTAK H170-MADIT-CRT-EXPLANTED 2011 implanted defibrillator- Guidant cognis, model n119-2001 Dr. Caryl Comes (11/28/2009)  . LBBB (left bundle branch block)   . Hyperlipemia   . GERD (gastroesophageal reflux disease)   . Colonic polyp   . Crohn's disease   . Acute vascular insufficiency of intestine     Mesenteric embolus...surgery 2003  . Ventral hernia   . Hypertrophy of prostate with urinary obstruction and other lower urinary tract symptoms (LUTS)   . Degenerative joint disease   . Polymyalgia rheumatica   . Tremor   . Anxiety   . Shingles   . Vitamin B12 deficiency   . CAD (coronary artery disease)     CABG 1994  /   catheterization 1996, occluded vein graft to the diagonal, other grafts patent  /   catheterization 2006, no PCI  /  nuclear, October, 2011, extensive scar anteroseptal and apical, no ischemia  . Hx of CABG     1994  . Ejection fraction     EF 35%, echo, November, 2011  . Warfarin anticoagulation     Coumadin therapy for mesenteric embolus 2003  . Carotid artery disease     Doppler, February,  2012, 0-39% bilateral, recommended followup 1 year      Past Surgical History  Procedure Date  . Coronary artery bypass graft 1994  .  Laparoscopic cholecystectomy 2002  . Elap w/ superior mesenteric artery embolectomy 1/03    by Dr. Jennette Banker  . Repair of incarcerated ventral hernia and lysis of adhesions 11/03    by Dr. Betsy Pries  . Implantation of biventric cardioverter-defibrillatorr     by Dr. Lovena Le in 8/06 [employer] [place] 2011  . Implantation of guidant cognis device     model [id number]  . Left inguinal hernia repair with mesh 6/08    by Dr. Betsy Pries    in for   Chief Complaint  Patient presents with  . Abdominal Pain  . Hernia    right abd     HPI  [name]  is a 78 y.o. 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 Maye Hides who requested internal medicine admission and suggested that he would see the  patient shortly.  By the time I have seen the patient in the ER patient has received an NG tube he is currently abdominal pain free and has been he appears much softer is a admission and also per his family's input. He is currently not nauseated and feels much better than what he came in with.    Review of Systems    In addition to the HPI above,   No Fever-chills, No Headache, No changes with Vision or hearing, No problems swallowing food or Liquids, No Chest pain, Cough or Shortness of Breath, +Abdominal pain, ++ Nausea - Vommitting, Bowel movements are regular, No Blood in stool or Urine, No dysuria, No  new skin rashes or bruises, No new joints pains-aches,  No new weakness, tingling, numbness in any extremity, No recent weight gain or loss, No polyuria, polydypsia or polyphagia, No significant Mental Stressors.  A full 10 point Review of Systems was done, except as stated above, all other Review of Systems were negative.   Social History History  Substance Use Topics  . Smoking status: Former Smoker -- 1.0 packs/day for 20 years    Types: Cigarettes    Quit date: 01/31/1973  . Smokeless tobacco: Never Used  . Alcohol Use: Yes      Family History Family History  Problem Relation Age of Onset  . Heart disease    . Hypertension    . Hyperlipidemia    . Tremor Sister       Prior to Admission medications   Medication Sig Start Date End Date Taking? Authorizing Provider  aspirin 81 MG tablet Take 81 mg by mouth daily.     Yes Historical Provider, MD  carvedilol (COREG) 6.25 MG tablet Take 1 tablet (6.25 mg total) by mouth 2 (two) times daily with a meal. 1 tablet twice a day 02/14/11  Yes Carlena Bjornstad, MD  cholecalciferol (VITAMIN D) 1000 UNITS tablet Take 1,000-2,000 Units by mouth daily. 1-2 tablets once a day   Yes Historical Provider, MD  Cyanocobalamin (B-12) 1000 MCG CAPS Take 1 capsule by mouth daily.     Yes Historical Provider, MD  folic acid (FOLVITE) 1 MG tablet Take 1 mg by mouth daily.     Yes Historical Provider, MD  furosemide (LASIX) 40 MG tablet Take 1 tablet (40 mg total) by mouth daily. One tablet once a day 02/14/11  Yes Carlena Bjornstad, MD  hydrocortisone (ANUSOL-HC) 2.5 % rectal cream Place 1 application rectally 2 (two) times daily. Use as directed 05/19/10  Yes Noralee Space, MD  lisinopril (PRINIVIL,ZESTRIL) 10 MG tablet Take 1 tablet (10 mg total) by mouth daily. 02/14/11  Yes Carlena Bjornstad, MD  mercaptopurine (PURINETHOL) 50 MG tablet Take 75 mg by mouth daily. Take 1.5 tabs by mouth once daily as directed by Dr. Su Ley   Yes Historical Provider, MD    simvastatin (ZOCOR) 20 MG tablet Take 1 tablet (20 mg total) by mouth at bedtime. 02/14/11  Yes Carlena Bjornstad, MD  spironolactone (ALDACTONE) 25 MG tablet Take 1 tablet (25 mg total) by mouth daily. One tablet once a day 02/14/11  Yes Carlena Bjornstad, MD  warfarin (COUMADIN) 5 MG tablet Take 15-20 mg by mouth daily. TAKES 20MG ON [facility] AND Friday,AND TAKES 15MG Tuesday Thursday Saturday AND Sunday   Yes Historical Provider, MD    No Known Allergies  Physical Exam  Vitals  Blood pressure 111/64, pulse 60, temperature 97.4 F (36.3 C), temperature source Oral,  resp. rate 18, SpO2 94.00%.   1. General pleasant elderly male lying in bed in NAD,  With NG tube  2. Normal affect and insight, Not Suicidal or Homicidal, Awake Alert, Oriented *3.  3. No F.N deficits, ALL C.Nerves Intact, Strength 5/5 all 4 extremities, Sensation intact all 4 extremities, Plantars down going.  4. Ears and Eyes appear Normal, Conjunctivae clear, PERRLA. Moist Oral Mucosa.  5. Supple Neck, No JVD, No cervical lymphadenopathy appriciated, No Carotid Bruits.  6. Symmetrical Chest wall movement, Good air movement bilaterally, CTAB.  7. RRR, No Gallops, Rubs or Murmurs, No Parasternal Heave.  8. Positive Bowel Sounds, Abdomen Soft, Non tender, No organomegaly appriciated,       No rebound -guarding or rigidity. Multiple Abd wall herniations, old surgical scars  9.  No Cyanosis, Normal Skin Turgor, No Skin Rash or Bruise.  10. Good muscle tone,  joints appear normal , no effusions, Normal ROM.  11. No Palpable Lymph Nodes in Neck or Axillae     Data Review  CBC  Lab 04/14/11 1417  WBC 6.5  HGB 14.7  HCT 42.2  PLT 77*  MCV 103.2*  MCH 35.9*  MCHC 34.8  RDW 16.1*  LYMPHSABS 0.7  MONOABS 0.5  EOSABS 0.0  BASOSABS 0.0  BANDABS --   ------------------------------------------------------------------------------------------------------------------ Chemistries   Lab 04/14/11 1417  NA  137  K 4.5  CL 100  CO2 33*  GLUCOSE 154*  BUN 10  CREATININE 0.90  CALCIUM 9.8  MG --  AST 46*  ALT 36  ALKPHOS 68  BILITOT 4.9*   ------------------------------------------------------------------------------------------------------------------ CrCl is unknown because both a height and weight (above a minimum accepted value) are required for this calculation. ------------------------------------------------------------------------------------------------------------------ No results found for this basename: TSH,T4TOTAL,FREET3,T3FREE,THYROIDAB in the last 72 hours  Coagulation profile  Lab 04/14/11 1417 04/08/11 1103  INR 2.44* 3.4  PROTIME -- --   ------------------------------------------------------------------------------------------------------------------- No results found for this basename: DDIMER:2 in the last 72 hours ------------------------------------------------------------------------------------------------------------------- Cardiac Enzymes No results found for this basename: CK:3,CKMB:3,TROPONINI:3,MYOGLOBIN:3 in the last 168 hours ------------------------------------------------------------------------------------------------------------------ No components found with this basename: POCBNP:3 ------------------------------------------------------------------------------------------------------------------  Imaging results:   Ct Abdomen Pelvis W Contrast  04/14/2011  *RADIOLOGY REPORT*  Clinical Data: Abdominal pain  CT ABDOMEN AND PELVIS WITH CONTRAST  Technique:  Multidetector CT imaging of the abdomen and pelvis was performed following the standard protocol during bolus administration of intravenous contrast.  Contrast: 146m OMNIPAQUE IOHEXOL 300 MG/ML IJ SOLN  Comparison: None.  Findings: Early or partial small bowel obstruction pattern is present.  Proximal small bowel loops are dilated.  Contrast extends through small bowel to a right abdominal hernia.   Small bowel is herniated through the defect.  Fecal like material is inspissated in the small bowel at the level of the obstruction.  See image #38. Small bowel loops traverse back into the peritoneal space then into an additional hernia.  There is probably a level of obstruction in both of these hernia sacs.  Scar tissue surrounds the area.  The hernia is secondary to a defect in the anterior abdominal wall likely from multiple prior surgeries.  There is a dilated bowel loop in the lower mid abdomen on image 56 likely is a result of bowel anastomosis.  Distal small bowel and ileum are decompressed.  Ascending colon protrudes through the hernia defect but there is no evidence of colonic obstruction.  Mild edema involving the ascending colon as well as in the adjacent mesentery is likely related to the dilated  small bowel loops rather than primary inflammation of the colon.  Small amount of free fluid is seen anterior to small bowel loops which are dilated on image 36.  There is no free intraperitoneal gas.  Post cholecystectomy.  Small amount of free fluid around the liver. Diffuse hepatic steatosis.  Pancreas, adrenal glands are within normal limits. Tiny hypodensity in the posterior and medial spleen on image 20 of unknown significance.  Tiny liver hypodensity on image 14 of unknown significance.  Calcified granulomata in the liver.  Chronic changes of the kidneys.  Benign-appearing cyst in the upper pole of the right kidney.  Other hypodensities are too small to characterize  Penetrating atherosclerotic ulcer in the infrarenal aorta is noted on image 45.  Maximal aortic caliber at this level is 3.2 cm.  Mild ectasia of the right and left common iliac artery with caliber is of 1.8 and 1.8 cm respectively.  Portal vein is patent.  Gastroesophageal varices are identified of unknown significance.  Bladder is minimally distended.  Prostate is within normal limits.  Right rectus femoris lipoma.    IMPRESSION: The  small bowel partial or early small bowel obstruction pattern secondary to and anterior abdominal wall hernia and wall defect as described.  There is inflammation within the right side the abdomen and anterior abdomen secondary to this process.  Gastroesophageal varices of unknown significance.  Chronic changes.  Original Report Authenticated By: Jamas Lav, M.D.          Assessment & Plan  1.Abdominal pain secondary to small bowel obstruction in a patient with history of Crohn's disease and multiple plan abdominal surgeries- patient has already received NG tube, he feels better and he is pain free, his abdomen at this time copy is much softer than what he came in with, he seems to have some bowel sounds at this point, we'll continue to keep him n.p.o., his Coumadin, so he can see him shortly, will continue NG tube at low intermittent suction, and monitor his progress.  In case patient gets worse and needs surgery he would be a high-risk candidate for cardiopulmonary complication which have been explained to the family, didn't accept this risk and recognized this risk. Patient's Coumadin will be had once his INR is under 2 he will be started on heparin drip per protocol.  Cardio-Pulm Risk stratification for surgery and recommendations to minimize the same:-  A.Cardio-Pulmonary Risk -  this patient is a  high for adverse Cardio-Pulmonary  Outcome  from surgery, the risks and benefits were discussed and acceptable to patient wife and family members.  Recommendations for optimizing Cardio-Pulmonary  Risk risk factors  1. Keep SBP<140, HR<85, use Lopressor 44m IV q4hrs PRN, or B.Blocker drip PRN. 2. Tight I&O goal to keep even. 3. Minimal sedation. 4. Good pulmunary toilet. 5. Scheduled/PRN Nebs ordered keep Pox>90% 6. Hb>8, transfuse as needed- Lasix 159mIV after each unit PRBC Transfused.   B.Bleeding Risk - no previous surgical complications, no easy bruising, INR 2.4, will be on  aspirin suppository, heparin drip and and I would be checked on a daily basis.    2. History of ischemic cardiomyopathy with Chronic systolic CHF EF 3416%nd [id number], Patient is status post AICD placement.  -   Currently he is pain-free , notice, no rales trace bipedal edema, he is n.p.o. due to #1 above, begin to D5 IV fluids will be provided, as well as observation of his lung exam and oxygen demands, daily weights and  I.'s and also, IV Lasix if patient's fluid status appears to be on the positive side. [place] [facility] will be contacted if there are any complications patient sees Dr. [name].    3. History of mesenteric ischemia - for now Coumadin will be had, once INR is under 2 heparin drip will be started, daily INR will be checked. If patient needs emergent surgery heparin drip will be stopped if ionized above 1.7 FFP his units can be given.   4. History of Crohn's disease patient gets a routine colonoscopy as outpatient for now his by mouth medications will be held due to #1 if need be GI will be consulted.   5. Elevated lipase unclear etiology no signs of pancreatitis on CT scan history not suggestive of acute pancreatitis we will recheck lipase in the morning and monitor clinically.   6. Nonspecific spleen liver and Abdominal Aorta noted on CT-  outpatient GI followup would be recommended.    DVT Prophylaxis Heparin  SCDs   AM Labs Ordered, also please review Full Orders  Admission, patients condition and plan of care including tests being ordered have been discussed with the patient and family who indicate understanding and agree with the plan and Code Status.  Code Status Full  Condition GUARDED   Thurnell Lose M.D on 04/14/2011 at 7:20 PM  [facility] Office  [phone]

## 2011-04-14 NOTE — ED Notes (Signed)
Pt was given Zofran 45m IV en route to hospital.

## 2011-04-14 NOTE — ED Notes (Signed)
GSP:[id number]<HR> Expected date:04/14/11<BR> Expected time:<BR> Means of arrival:<BR> Comments:<BR> EMS 41 GC - abd pain/vomiting

## 2011-04-14 NOTE — Assessment & Plan Note (Signed)
Severe Acute Abdominal pain with n/v ? Acute Abdominal with ?incarcerated hernia  EMS to transport to ER ASAP.  Case discussed with Dr. Gwenette Greet with aggreement for transfer.  EMS contact for emergency transport to ER.

## 2011-04-14 NOTE — Progress Notes (Signed)
ANTICOAGULATION CONSULT NOTE - Initial Consult  Pharmacy Consult for Heparin Indication: h/o mesentric clot, PTA warfarin on hold  No Known Allergies  Patient Measurements: Height: 6' (182.9 cm) Weight: 199 lb 4.7 oz (90.4 kg) IBW/kg (Calculated) : 77.6   Vital Signs: Temp: 98.2 F (36.8 C) (03/14 2140) Temp src: Oral (03/14 2140) BP: 134/80 mmHg (03/14 2140) Pulse Rate: 80  (03/14 2140)  Labs:  Basename 04/14/11 1417  HGB 14.7  HCT 42.2  PLT 77*  APTT --  LABPROT 26.9*  INR 2.44*  HEPARINUNFRC --  CREATININE 0.90  CKTOTAL --  CKMB --  TROPONINI --   Estimated Creatinine Clearance: 75.4 ml/min (by C-G formula based on Cr of 0.9).  Medical History: Past Medical History  Diagnosis Date  . Dizziness     Positional dizziness  . Hypertension   . Ischemic cardiomyopathy     CABG 1994 CAD catherization 1996.[name] occluded vein graft to the diagnol, other grafts patent/  catherization 2006, no PCI/ nuclear.[name] october 2011.[name] extensive scar anteroseptal and apical.. no ischemia    . CHF (congestive heart failure)     chronic systolic  . Mitral regurgitation     mild.[name] echo november 2011  EF 35%...echo.. november 2009/ ef 35%.[name] echo november 2011  . Sinus bradycardia   . Chronotropic incompetence   . Biventricular implantable cardiac defibrillator in situ     GDT CONTAK H170-MADIT-CRT-EXPLANTED 2011 implanted defibrillator- Guidant cognis, model n119-2001 Dr. Caryl Comes (11/28/2009)  . LBBB (left bundle branch block)   . Hyperlipemia   . GERD (gastroesophageal reflux disease)   . Colonic polyp   . Crohn's disease   . Acute vascular insufficiency of intestine     Mesenteric embolus...surgery 2003  . Ventral hernia   . Hypertrophy of prostate with urinary obstruction and other lower urinary tract symptoms (LUTS)   . Degenerative joint disease   . Polymyalgia rheumatica   . Tremor   . Anxiety   . Shingles   . Vitamin B12 deficiency   . CAD (coronary artery disease)    CABG 1994  /   catheterization 1996, occluded vein graft to the diagonal, other grafts patent  /   catheterization 2006, no PCI  /  nuclear, October, 2011, extensive scar anteroseptal and apical, no ischemia  . Hx of CABG     1994  . Ejection fraction     EF 35%, echo, November, 2011  . Warfarin anticoagulation     Coumadin therapy for mesenteric embolus 2003  . Carotid artery disease     Doppler, February,  2012, 0-39% bilateral, recommended followup 1 year    Medications:  Scheduled:    . sodium chloride   Intravenous Once  . sodium chloride   Intravenous STAT  . aspirin  150 mg Rectal Daily  . hydrocortisone  1 application Rectal BID  . ondansetron      . ondansetron      . ondansetron (ZOFRAN) IV  4 mg Intravenous Once  . sodium chloride  3 mL Intravenous Q12H   Infusions:    . dextrose      Assessment:  78 yo M admit with small bowel obstruction  Prior to admission was on warfarin for h/o mesentric clot, but warfarin now on hold for possible surgical intervention  INR therapeutic at 2.44  Thrombocytopenia with platelets 77  CT shows Gastroesophageal varices of unknown significance.  Goal of Therapy:  Heparin level 0.3-0.7 units/ml   Plan:  Plan to start Heparin infusion  once INR <2   Gretta Arab PharmD, BCPS Pager [phone] 04/14/2011 10:12 PM

## 2011-04-14 NOTE — Telephone Encounter (Signed)
I spoke with Jess and she stated to bring pt in today at 11:30 on TP schedule. I spoke with spouse and is aware of the apt. Nothing further was needed

## 2011-04-15 ENCOUNTER — Encounter (HOSPITAL_COMMUNITY): Payer: Self-pay | Admitting: Physician Assistant

## 2011-04-15 ENCOUNTER — Inpatient Hospital Stay (HOSPITAL_COMMUNITY): Payer: Medicare Other

## 2011-04-15 DIAGNOSIS — Z01810 Encounter for preprocedural cardiovascular examination: Secondary | ICD-10-CM

## 2011-04-15 DIAGNOSIS — K56609 Unspecified intestinal obstruction, unspecified as to partial versus complete obstruction: Secondary | ICD-10-CM | POA: Diagnosis not present

## 2011-04-15 DIAGNOSIS — I5022 Chronic systolic (congestive) heart failure: Secondary | ICD-10-CM | POA: Diagnosis not present

## 2011-04-15 DIAGNOSIS — R109 Unspecified abdominal pain: Secondary | ICD-10-CM | POA: Diagnosis not present

## 2011-04-15 DIAGNOSIS — I2510 Atherosclerotic heart disease of native coronary artery without angina pectoris: Secondary | ICD-10-CM | POA: Diagnosis not present

## 2011-04-15 DIAGNOSIS — Z95810 Presence of automatic (implantable) cardiac defibrillator: Secondary | ICD-10-CM | POA: Diagnosis not present

## 2011-04-15 DIAGNOSIS — R110 Nausea: Secondary | ICD-10-CM | POA: Diagnosis not present

## 2011-04-15 LAB — CBC
HCT: 37.9 % — ABNORMAL LOW (ref 39.0–52.0)
MCH: 35.7 pg — ABNORMAL HIGH (ref 26.0–34.0)
MCV: 103.3 fL — ABNORMAL HIGH (ref 78.0–100.0)
Platelets: 65 10*3/uL — ABNORMAL LOW (ref 150–400)
RDW: 16.3 % — ABNORMAL HIGH (ref 11.5–15.5)
WBC: 5.2 10*3/uL (ref 4.0–10.5)

## 2011-04-15 LAB — PROTIME-INR
INR: 2.59 — ABNORMAL HIGH (ref 0.00–1.49)
Prothrombin Time: 28.2 seconds — ABNORMAL HIGH (ref 11.6–15.2)

## 2011-04-15 LAB — COMPREHENSIVE METABOLIC PANEL
AST: 38 U/L — ABNORMAL HIGH (ref 0–37)
Albumin: 3 g/dL — ABNORMAL LOW (ref 3.5–5.2)
Alkaline Phosphatase: 54 U/L (ref 39–117)
Chloride: 100 mEq/L (ref 96–112)
Potassium: 3.7 mEq/L (ref 3.5–5.1)
Sodium: 136 mEq/L (ref 135–145)
Total Bilirubin: 4.5 mg/dL — ABNORMAL HIGH (ref 0.3–1.2)
Total Protein: 5.4 g/dL — ABNORMAL LOW (ref 6.0–8.3)

## 2011-04-15 LAB — LIPASE, BLOOD: Lipase: 36 U/L (ref 11–59)

## 2011-04-15 MED ORDER — BIOTENE DRY MOUTH MT LIQD
15.0000 mL | Freq: Two times a day (BID) | OROMUCOSAL | Status: DC
  Administered 2011-04-15 – 2011-04-18 (×7): 15 mL via OROMUCOSAL

## 2011-04-15 MED ORDER — PANTOPRAZOLE SODIUM 40 MG IV SOLR
40.0000 mg | Freq: Two times a day (BID) | INTRAVENOUS | Status: DC
  Administered 2011-04-15 – 2011-04-19 (×9): 40 mg via INTRAVENOUS
  Filled 2011-04-15 (×10): qty 40

## 2011-04-15 MED ORDER — DEXTROSE 5 % IV SOLN
INTRAVENOUS | Status: AC
  Administered 2011-04-15: 09:00:00 via INTRAVENOUS

## 2011-04-15 MED ORDER — BENZOCAINE (TOPICAL) 20 % EX AERO
INHALATION_SPRAY | Freq: Four times a day (QID) | CUTANEOUS | Status: DC | PRN
  Administered 2011-04-15 (×2): 1 via OROMUCOSAL
  Filled 2011-04-15: qty 57

## 2011-04-15 NOTE — Progress Notes (Signed)
Patient to transfer to room 1406.  Report given to [place], Therapist, sports.  Will transfer patient by bed.  Will continue to monitor.

## 2011-04-15 NOTE — Progress Notes (Signed)
   CARE MANAGEMENT NOTE 04/15/2011  Patient:  [name], [name]   Account Number:  [id number]  Date Initiated:  04/15/2011  Documentation initiated by:  Dessa Phi  Subjective/Objective Assessment:   ADMITTED W/ABD PAIN.GJ:GMLVXB.     Action/Plan:   FROM HOME   Anticipated DC Date:  04/20/2011   Anticipated DC Plan:  HOME/SELF CARE         Choice offered to / List presented to:             Status of service:  In process, will continue to follow Medicare Important Message given?   (If response is "NO", the following Medicare IM given date fields will be blank) Date Medicare IM given:   Date Additional Medicare IM given:    Discharge Disposition:    Per UR Regulation:  Reviewed for med. necessity/level of care/duration of stay  If discussed at Long Length of Stay Meetings, dates discussed:    Comments:  04/15/11 Jarom Govan RN,BSN NCM [place].

## 2011-04-15 NOTE — Progress Notes (Signed)
Pt's history reviewed and pt examnined.  Agree with note per NP.

## 2011-04-15 NOTE — Progress Notes (Signed)
Subjective: Passing some gas.  Objective: Vital signs in last 24 hours: Temp:  [96.7 F (35.9 C)-98.2 F (36.8 C)] 97.7 F (36.5 C) (03/15 0800) Pulse Rate:  [59-80] 59  (03/15 1000) Resp:  [16-22] 22  (03/15 1000) BP: (111-134)/(47-80) 118/47 mmHg (03/15 1000) SpO2:  [91 %-98 %] 97 % (03/15 1000) Weight:  [199 lb 4.7 oz (90.4 kg)-203 lb 12.8 oz (92.443 kg)] 199 lb 4.7 oz (90.4 kg) (03/14 2140)    Intake/Output from previous day: 03/14 0701 - 03/15 0700 In: 364.7 [I.V.:364.7] Out: 1950 [Urine:500; Emesis/NG output:1450] Intake/Output this shift: Total I/O In: 160 [I.V.:150; IV Piggyback:10] Out: 200 [Emesis/NG output:200]  PE: Abd-soft, nondistended, few BS, multiple reducible abdominal wall hernias, broad midline scar  Lab Results:   Basename 04/15/11 0338 04/14/11 1417  WBC 5.2 6.5  HGB 13.1 14.7  HCT 37.9* 42.2  PLT 65* 77*   BMET  Basename 04/15/11 0338 04/14/11 1417  NA 136 137  K 3.7 4.5  CL 100 100  CO2 30 33*  GLUCOSE 91 154*  BUN 10 10  CREATININE 0.87 0.90  CALCIUM 9.1 9.8   PT/INR  Basename 04/15/11 0338 04/14/11 1417  LABPROT 28.2* 26.9*  INR 2.59* 2.44*   Comprehensive Metabolic Panel:    Component Value Date/Time   NA 136 04/15/2011 0338   K 3.7 04/15/2011 0338   CL 100 04/15/2011 0338   CO2 30 04/15/2011 0338   BUN 10 04/15/2011 0338   CREATININE 0.87 04/15/2011 0338   GLUCOSE 91 04/15/2011 0338   CALCIUM 9.1 04/15/2011 0338   AST 38* 04/15/2011 0338   ALT 29 04/15/2011 0338   ALKPHOS 54 04/15/2011 0338   BILITOT 4.5* 04/15/2011 0338   PROT 5.4* 04/15/2011 0338   ALBUMIN 3.0* 04/15/2011 0338   Abdominal x-ray:  Contrast in colon  Studies/Results: Ct Abdomen Pelvis W Contrast  04/14/2011  *RADIOLOGY REPORT*  Clinical Data: Abdominal pain  CT ABDOMEN AND PELVIS WITH CONTRAST  Technique:  Multidetector CT imaging of the abdomen and pelvis was performed following the standard protocol during bolus administration of intravenous contrast.   Contrast: 182m OMNIPAQUE IOHEXOL 300 MG/ML IJ SOLN  Comparison: None.  Findings: Early or partial small bowel obstruction pattern is present.  Proximal small bowel loops are dilated.  Contrast extends through small bowel to a right abdominal hernia.  Small bowel is herniated through the defect.  Fecal like material is inspissated in the small bowel at the level of the obstruction.  See image #38. Small bowel loops traverse back into the peritoneal space then into an additional hernia.  There is probably a level of obstruction in both of these hernia sacs.  Scar tissue surrounds the area.  The hernia is secondary to a defect in the anterior abdominal wall likely from multiple prior surgeries.  There is a dilated bowel loop in the lower mid abdomen on image 56 likely is a result of bowel anastomosis.  Distal small bowel and ileum are decompressed.  Ascending colon protrudes through the hernia defect but there is no evidence of colonic obstruction.  Mild edema involving the ascending colon as well as in the adjacent mesentery is likely related to the dilated small bowel loops rather than primary inflammation of the colon.  Small amount of free fluid is seen anterior to small bowel loops which are dilated on image 36.  There is no free intraperitoneal gas.  Post cholecystectomy.  Small amount of free fluid around the liver. Diffuse hepatic steatosis.  Pancreas, adrenal glands are within normal limits. Tiny hypodensity in the posterior and medial spleen on image 20 of unknown significance.  Tiny liver hypodensity on image 14 of unknown significance.  Calcified granulomata in the liver.  Chronic changes of the kidneys.  Benign-appearing cyst in the upper pole of the right kidney.  Other hypodensities are too small to characterize  Penetrating atherosclerotic ulcer in the infrarenal aorta is noted on image 45.  Maximal aortic caliber at this level is 3.2 cm.  Mild ectasia of the right and left common iliac artery with  caliber is of 1.8 and 1.8 cm respectively.  Portal vein is patent.  Gastroesophageal varices are identified of unknown significance.  Bladder is minimally distended.  Prostate is within normal limits.  Right rectus femoris lipoma.  IMPRESSION: The small bowel partial or early small bowel obstruction pattern secondary to and anterior abdominal wall hernia and wall defect as described.  There is inflammation within the right side the abdomen and anterior abdomen secondary to this process.  Gastroesophageal varices of unknown significance.  Chronic changes.  Original Report Authenticated By: Jamas Lav, M.D.    Anti-infectives: Anti-infectives    None      Assessment Principal Problem:  *SBO (small bowel obstruction)-some improvement Active Problems:  VITAMIN B12 DEFICIENCY  CARDIOMYOPATHY, ISCHEMIC  s/p CABG    LBBB  Mesenteric thrombosis  CAD (coronary artery disease)  Ischemic cardiomyopathy  Ejection fraction  CHF (congestive heart failure)  Biventricular implantable cardiac defibrillator in situ  Crohn's disease  Warfarin anticoagulation  Abdominal pain    LOS: 1 day   Plan: Keep ng in for another day.  If ng output goes down and he has consistent bowel activity, could remove ng tomorrow and start on a liquid diet.   [name] 04/15/2011

## 2011-04-15 NOTE — Progress Notes (Signed)
[name] LKJ:[id number]:[id number] is a 78 y.o. male,  Outpatient Primary MD for the patient is Noralee Space, MD, MD  Chief Complaint  Patient presents with  . Abdominal Pain  . Hernia    right abd        Subjective:   [name] today has, No headache, No chest pain, No abdominal pain - No Nausea, No new weakness tingling or numbness, No Cough - SOB.    Objective:   Filed Vitals:   04/15/11 0000 04/15/11 0048 04/15/11 0317 04/15/11 0800  BP:   129/64 127/66  Pulse: 63 64 64 59  Temp: 97.3 F (36.3 C)  97.8 F (36.6 C) 97.7 F (36.5 C)  TempSrc: Oral  Oral Oral  Resp: 20 18 16 21   Height:      Weight:      SpO2: 91% 93% 97% 98%    Wt Readings from Last 3 Encounters:  04/14/11 90.4 kg (199 lb 4.7 oz)  04/14/11 92.443 kg (203 lb 12.8 oz)  03/07/11 92.08 kg (203 lb)     Intake/Output Summary (Last 24 hours) at 04/15/11 0902 Last data filed at 04/15/11 0800  Gross per 24 hour  Intake 414.67 ml  Output   2150 ml  Net -1735.33 ml    Exam Awake Alert, Oriented *3, No new F.N deficits, Normal affect [place].AT,PERRAL Supple Neck,No JVD, No cervical lymphadenopathy appriciated.  Symmetrical Chest wall movement, Good air movement bilaterally, CTAB RRR,No Gallops,Rubs or new Murmurs, No Parasternal Heave +ve B.Sounds, Abd Soft, Non tender, No organomegaly appriciated, No rebound -guarding or rigidity, Multiple ventral incisional herniae, reducible , NG in place  No Cyanosis, Clubbing or edema, No new Rash or bruise     Data Review  CBC  Lab 04/15/11 0338 04/14/11 1417  WBC 5.2 6.5  HGB 13.1 14.7  HCT 37.9* 42.2  PLT 65* 77*  MCV 103.3* 103.2*  MCH 35.7* 35.9*  MCHC 34.6 34.8  RDW 16.3* 16.1*  LYMPHSABS -- 0.7  MONOABS -- 0.5  EOSABS -- 0.0  BASOSABS -- 0.0  BANDABS -- --    Chemistries   Lab 04/15/11 0338 04/14/11 1417  NA 136 137  K 3.7 4.5  CL 100 100  CO2 30 33*  GLUCOSE 91 154*  BUN 10 10  CREATININE 0.87 0.90  CALCIUM 9.1 9.8  MG -- --   AST 38* 46*  ALT 29 36  ALKPHOS 54 68  BILITOT 4.5* 4.9*   ------------------------------------------------------------------------------------------------------------------ estimated creatinine clearance is 78 ml/min (by C-G formula based on Cr of 0.87). ------------------------------------------------------------------------------------------------------------------ No results found for this basename: HGBA1C:2 in the last 72 hours ------------------------------------------------------------------------------------------------------------------ No results found for this basename: CHOL:2,HDL:2,LDLCALC:2,TRIG:2,CHOLHDL:2,LDLDIRECT:2 in the last 72 hours ------------------------------------------------------------------------------------------------------------------ No results found for this basename: TSH,T4TOTAL,FREET3,T3FREE,THYROIDAB in the last 72 hours ------------------------------------------------------------------------------------------------------------------ No results found for this basename: VITAMINB12:2,FOLATE:2,FERRITIN:2,TIBC:2,IRON:2,RETICCTPCT:2 in the last 72 hours  Coagulation profile  Lab 04/15/11 0338 04/14/11 1417 04/08/11 1103  INR 2.59* 2.44* 3.4  PROTIME -- -- --    No results found for this basename: DDIMER:2 in the last 72 hours  Cardiac Enzymes No results found for this basename: CK:3,CKMB:3,TROPONINI:3,MYOGLOBIN:3 in the last 168 hours ------------------------------------------------------------------------------------------------------------------ No components found with this basename: POCBNP:3  Micro Results Recent Results (from the past 240 hour(s))  MRSA PCR SCREENING     Status: Normal   Collection Time   04/14/11 10:24 PM      Component Value Range Status Comment   MRSA by PCR NEGATIVE  NEGATIVE  Final  Radiology Reports Ct Abdomen Pelvis W Contrast  04/14/2011  *RADIOLOGY REPORT*  Clinical Data: Abdominal pain  CT ABDOMEN AND PELVIS  WITH CONTRAST  Technique:  Multidetector CT imaging of the abdomen and pelvis was performed following the standard protocol during bolus administration of intravenous contrast.  Contrast: 155m OMNIPAQUE IOHEXOL 300 MG/ML IJ SOLN  Comparison: None.  Findings: Early or partial small bowel obstruction pattern is present.  Proximal small bowel loops are dilated.  Contrast extends through small bowel to a right abdominal hernia.  Small bowel is herniated through the defect.  Fecal like material is inspissated in the small bowel at the level of the obstruction.  See image #38. Small bowel loops traverse back into the peritoneal space then into an additional hernia.  There is probably a level of obstruction in both of these hernia sacs.  Scar tissue surrounds the area.  The hernia is secondary to a defect in the anterior abdominal wall likely from multiple prior surgeries.  There is a dilated bowel loop in the lower mid abdomen on image 56 likely is a result of bowel anastomosis.  Distal small bowel and ileum are decompressed.  Ascending colon protrudes through the hernia defect but there is no evidence of colonic obstruction.  Mild edema involving the ascending colon as well as in the adjacent mesentery is likely related to the dilated small bowel loops rather than primary inflammation of the colon.  Small amount of free fluid is seen anterior to small bowel loops which are dilated on image 36.  There is no free intraperitoneal gas.  Post cholecystectomy.  Small amount of free fluid around the liver. Diffuse hepatic steatosis.  Pancreas, adrenal glands are within normal limits. Tiny hypodensity in the posterior and medial spleen on image 20 of unknown significance.  Tiny liver hypodensity on image 14 of unknown significance.  Calcified granulomata in the liver.  Chronic changes of the kidneys.  Benign-appearing cyst in the upper pole of the right kidney.  Other hypodensities are too small to characterize  Penetrating  atherosclerotic ulcer in the infrarenal aorta is noted on image 45.  Maximal aortic caliber at this level is 3.2 cm.  Mild ectasia of the right and left common iliac artery with caliber is of 1.8 and 1.8 cm respectively.  Portal vein is patent.  Gastroesophageal varices are identified of unknown significance.  Bladder is minimally distended.  Prostate is within normal limits.  Right rectus femoris lipoma.  IMPRESSION: The small bowel partial or early small bowel obstruction pattern secondary to and anterior abdominal wall hernia and wall defect as described.  There is inflammation within the right side the abdomen and anterior abdomen secondary to this process.  Gastroesophageal varices of unknown significance.  Chronic changes.  Original Report Authenticated By: AJamas Lav M.D.    Scheduled Meds:   . sodium chloride   Intravenous Once  . sodium chloride   Intravenous STAT  . aspirin  150 mg Rectal Daily  . hydrocortisone  1 application Rectal BID  . ondansetron      . ondansetron      . ondansetron (ZOFRAN) IV  4 mg Intravenous Once  . sodium chloride  3 mL Intravenous Q12H   Continuous Infusions:   . dextrose 50 mL/hr at 04/14/11 2346   PRN Meds:.albuterol, guaiFENesin-dextromethorphan, HYDROmorphone (DILAUDID) injection, iohexol, metoprolol, morphine, ondansetron (ZOFRAN) IV, ondansetron, phenol, DISCONTD: ondansetron (ZOFRAN) IV  Assessment & Plan   1.Abdominal pain secondary to small bowel obstruction in a  patient with history of Crohn's disease - likely secondary to herniae and adhesions from prior surgical procedures has multiple ventral incisional herniae, reducible - continue has already received NG tube, he feels better and he is pain free, his abdomen is soft, he seems to have some bowel sounds at this point, we'll continue to keep him n.p.o., hold his Coumadin, although now i hear more B sounds and he is apssing some gas, ? If we can take out NG and monitor, Surg to see today.  KUB ordered. Add IV PPI.  In case patient gets worse and needs surgery he would be a high-risk candidate for cardiopulmonary complication which have been explained to the family, didn't accept this risk and recognized this risk. Patient's Coumadin will be had once his INR is under 2 he will be started on heparin drip per protocol.   Cardio-Pulm Risk stratification for surgery and recommendations to minimize the same:-   A.Cardio-Pulmonary Risk -  this patient is a  high for adverse Cardio-Pulmonary  Outcome  from surgery, the risks and benefits were discussed and acceptable to patient wife and family members.   Recommendations for optimizing Cardio-Pulmonary  Risk risk factors   1. Keep SBP<140, HR<85, use Lopressor 70m IV q4hrs PRN, or B.Blocker drip PRN. 2. Tight I&O goal to keep even. 3. Minimal sedation. 4. Good pulmunary toilet. 5. Scheduled/PRN Nebs ordered keep Pox>90% 6. Hb>8, transfuse as needed- Lasix 178mIV after each unit PRBC Transfused.     B.Bleeding Risk - no previous surgical complications, no easy bruising, INR 2.4, will be on aspirin suppository, heparin drip and and I would be checked on a daily basis.      2. History of ischemic cardiomyopathy with Chronic systolic CHF EF 3459%nd [id number], Patient is status post AICD placement.  -    Currently he is pain-free , compensated, no rales trace bipedal edema, he is n.p.o. due to #1 above, continue gentle D5 IV fluid, as well as observation of his lung exam and oxygen demands, daily weights and I.'s and also, IV Lasix if patient's fluid status appears to be on the positive side. [facility] any further input patient sees Dr. KaRon ParkerPRN IV Lopressor.   3. History of mesenteric ischemia - for now Coumadin will be had, once INR is under 2 heparin drip will be started, daily INR will be checked. If patient needs emergent surgery heparin drip will be stopped if ionized above 1.7 FFP his units  can be given.   4. History of Crohn's disease patient gets a routine colonoscopy as outpatient for now his by mouth medications will be held due to #1 if need be GI will be consulted.   5. Elevated lipase unclear etiology no signs of pancreatitis on CT scan history not suggestive of acute pancreatitis - NML lipase now.  6. Nonspecific spleen liver and Abdominal Aorta noted on CT-  outpatient GI followup would be recommended.     DVT Prophylaxis  Couamdin - Heparin - SCDs     SIThurnell Lose.D on 04/15/2011 at 9:02 AM  [facility] Office  [phone]

## 2011-04-15 NOTE — Progress Notes (Signed)
  Pharmacy Note (Brief) - IV Heparin  78 yo M on Warfarin prior to admission for hx of mesenteric clot. Warfarin now on hold for possible procedure/surgical intervention. Orders to start IV heparin once INR <2.  Labs (3/15):  INR = 2.59 H/H 13.1/37.9 Plts = 65  Assessment: INR remains >2. Plts continue to fall (77 --> 65)  Plan: 1) No IV heparin today. 2) F/U Daily INR. Start heparin IV once INR < 2. 3) Watch platelets. Continue to fall in absence of heparin product.  Verdia Kuba, PharmD Pager: [phone] 04/15/2011 8:07 AM

## 2011-04-15 NOTE — Consult Note (Signed)
Cardiology Consult Note   Patient ID: [name] MRN: [id number], DOB/AGE: May 07, 1931   Admit date: 04/14/2011 Date of Consult: 04/15/2011  Primary Physician: Noralee Space, MD, MD Primary Cardiologist: Daryel November, MD  Pt. Profile: [name] is a 78yo male with an extensive PMHx including CAD (CABG in 1994; last catheterization in 2006 demonstrated a total right and 80% marginal, vein graft to the diagonal was occluded and other bypasses were open; nuc study in 10/11: revealed extensive scar of the anteroseptal wall without evidence of ischemia) with resultant ICM (EF 2007 was about 35% s/p ICD Guidant Contak implantation 08/06, explanted in 2011, Guidant Cognis implanted 10/11), sinus bradycardia secondary to chronotropic incompetence (remote ICD check 01/13 revealed normal functioning A-pacing 63%, V-pacing 96%), 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   Reason for consult: pre-op evaluation for sbo   Problem List: Past Medical History  Diagnosis Date  . Dizziness     Positional dizziness  . Hypertension   . Ischemic cardiomyopathy     CABG 1994 CAD catherization 1996.[name] occluded vein graft to the diagnol, other grafts patent/  catherization 2006, no PCI/ nuclear.[name] october 2011.[name] extensive scar anteroseptal and apical.. no ischemia    . CHF (congestive heart failure)     chronic systolic  . Mitral regurgitation     mild.[name] echo november 2011  EF 35%...echo.. november 2009/ ef 35%.[name] echo november 2011  . Sinus bradycardia   . Chronotropic incompetence   . Biventricular implantable cardiac defibrillator in situ     GDT CONTAK H170-MADIT-CRT-EXPLANTED 2011 implanted defibrillator- Guidant cognis, model n119-2001 Dr. Caryl Comes (11/28/2009)  . LBBB (left bundle branch block)   . Hyperlipemia   . GERD (gastroesophageal reflux  disease)   . Colonic polyp   . Crohn's disease   . Acute vascular insufficiency of intestine     Mesenteric embolus...surgery 2003  . Ventral hernia   . Hypertrophy of prostate with urinary obstruction and other lower urinary tract symptoms (LUTS)   . Degenerative joint disease   . Polymyalgia rheumatica   . Tremor   . Anxiety   . Shingles   . Vitamin B12 deficiency   . CAD (coronary artery disease)     CABG 1994  /   catheterization 1996, occluded vein graft to the diagonal, other grafts patent  /   catheterization 2006, no PCI  /  nuclear, October, 2011, extensive scar anteroseptal and apical, no ischemia  . Hx of CABG     1994  . Ejection fraction     EF 35%, echo, November, 2011  . Warfarin anticoagulation     Coumadin therapy for mesenteric embolus 2003  . Carotid artery disease     Doppler, February,  2012, 0-39% bilateral, recommended followup 1 year    Past Surgical History  Procedure Date  . Coronary artery bypass graft 1994  . Laparoscopic cholecystectomy 2002  . Elap w/ superior mesenteric artery embolectomy 1/03    by Dr. Jennette Banker  . Repair of incarcerated ventral hernia and lysis of adhesions 11/03    by Dr. Betsy Pries  . Implantation of biventric cardioverter-defibrillatorr     by Dr. Lovena Le in 8/06 Guidant [place] 2011  . Implantation of guidant cognis device     model [id number]  . Left inguinal hernia repair with mesh 6/08    by Dr. Betsy Pries  Allergies: No Known Allergies  HPI:   [name] reports abdominal pain located in the RLQ attributed to hernia. It became larger and indurated per the patient. This occurred the evening of 04/13/11 and worsened the following day. It prevented the patient from sleeping and was associated with decreased PO intake, nausea and profuse vomiting. The patient presented to his PCP office and was transported to [facility] ED. [name] states that this is not the first occurrence of this episode. Prior to the episode, [name] denies chest  pain, decreased activity level, SOB, DOE, orthopnea, palpitations, lightheadedness or LE swelling.   [name] presented to [facility] ED with complaints of abdominal pain at the site of his ventral hernia which was hard and painful. This was reduced. [name] was started on gentle hydration. Physical exam and CT scan were suggestive for SBO. NG tube was successfully placed with symptom improvement and decrease in abdominal distention. [name] was made NPO. [name] was admitted to the medical service. Coumadin was held and heparin started should [name] require surgery. Surgery was consulted and agreed to follow. Today, [name] is passing gas. This AM abdominal x-ray revealing improved partial SBO with contrast passing into the colon. The plan is for NG tube to remain for an additional day, if output decreases and bowel activity improves, NG tube removal tomorrow with initiation of a clear liquid diet.   CXR this admission with evidence of pulmonary congestion. No clinical signs of ischemic, volume overload or pulmonary congestion.   Home Medications: Prior to Admission medications   Medication Sig Start Date End Date Taking? Authorizing Provider  aspirin 81 MG tablet Take 81 mg by mouth daily.     Yes Historical Provider, MD  carvedilol (COREG) 6.25 MG tablet Take 1 tablet (6.25 mg total) by mouth 2 (two) times daily with a meal. 1 tablet twice a day 02/14/11  Yes Carlena Bjornstad, MD  cholecalciferol (VITAMIN D) 1000 UNITS tablet Take 1,000-2,000 Units by mouth daily. 1-2 tablets once a day   Yes Historical Provider, MD  Cyanocobalamin (B-12) 1000 MCG CAPS Take 1 capsule by mouth daily.     Yes Historical Provider, MD  folic acid (FOLVITE) 1 MG tablet Take 1 mg by mouth daily.     Yes Historical Provider, MD  furosemide (LASIX) 40 MG tablet Take 1 tablet (40 mg total) by mouth daily. One tablet once a day 02/14/11  Yes Carlena Bjornstad, MD  hydrocortisone (ANUSOL-HC) 2.5 % rectal cream Place 1 application rectally 2 (two) times daily. Use as  directed 05/19/10  Yes Noralee Space, MD  lisinopril (PRINIVIL,ZESTRIL) 10 MG tablet Take 1 tablet (10 mg total) by mouth daily. 02/14/11  Yes Carlena Bjornstad, MD  mercaptopurine (PURINETHOL) 50 MG tablet Take 75 mg by mouth daily. Take 1.5 tabs by mouth once daily as directed by Dr. Su Ley   Yes Historical Provider, MD  simvastatin (ZOCOR) 20 MG tablet Take 1 tablet (20 mg total) by mouth at bedtime. 02/14/11  Yes Carlena Bjornstad, MD  spironolactone (ALDACTONE) 25 MG tablet Take 1 tablet (25 mg total) by mouth daily. One tablet once a day 02/14/11  Yes Carlena Bjornstad, MD  warfarin (COUMADIN) 5 MG tablet Take 15-20 mg by mouth daily. TAKES 20MG ON [facility] AND Friday,AND TAKES 15MG Tuesday Thursday Saturday AND Sunday   Yes Historical Provider, MD    Inpatient Medications:     . sodium chloride   Intravenous Once  . sodium chloride   Intravenous STAT  .  antiseptic oral rinse  15 mL Mouth Rinse BID  . aspirin  150 mg Rectal Daily  . hydrocortisone  1 application Rectal BID  . ondansetron      . pantoprazole (PROTONIX) IV  40 mg Intravenous Q12H  . sodium chloride  3 mL Intravenous Q12H   Prescriptions prior to admission  Medication Sig Dispense Refill  . aspirin 81 MG tablet Take 81 mg by mouth daily.        . carvedilol (COREG) 6.25 MG tablet Take 1 tablet (6.25 mg total) by mouth 2 (two) times daily with a meal. 1 tablet twice a day  180 tablet  3  . cholecalciferol (VITAMIN D) 1000 UNITS tablet Take 1,000-2,000 Units by mouth daily. 1-2 tablets once a day      . Cyanocobalamin (B-12) 1000 MCG CAPS Take 1 capsule by mouth daily.        . folic acid (FOLVITE) 1 MG tablet Take 1 mg by mouth daily.        . furosemide (LASIX) 40 MG tablet Take 1 tablet (40 mg total) by mouth daily. One tablet once a day  90 tablet  3  . hydrocortisone (ANUSOL-HC) 2.5 % rectal cream Place 1 application rectally 2 (two) times daily. Use as directed      . lisinopril (PRINIVIL,ZESTRIL) 10 MG tablet Take  1 tablet (10 mg total) by mouth daily.  90 tablet  3  . mercaptopurine (PURINETHOL) 50 MG tablet Take 75 mg by mouth daily. Take 1.5 tabs by mouth once daily as directed by Dr. Su Ley      . simvastatin (ZOCOR) 20 MG tablet Take 1 tablet (20 mg total) by mouth at bedtime.  90 tablet  3  . spironolactone (ALDACTONE) 25 MG tablet Take 1 tablet (25 mg total) by mouth daily. One tablet once a day  90 tablet  3  . warfarin (COUMADIN) 5 MG tablet Take 15-20 mg by mouth daily. TAKES 20MG ON [facility] AND Friday,AND TAKES 15MG Tuesday Thursday Saturday AND Sunday        Family History  Problem Relation Age of Onset  . Heart disease Mother   . Hypertension    . Hyperlipidemia    . Tremor Sister   . Heart disease Father 5    Deceased   . Cancer Brother      History   Social History  . Marital Status: Married    Spouse Name: Kennyth Lose    Number of Children: 3  . 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 Smoking status: Former Smoker -- 1.0 packs/day for 20 years    Types: Cigarettes    Quit date: 01/31/1973  . Smokeless tobacco: Never Used  . Alcohol Use: Yes     occasional  . Drug Use: No  . Sexually Active: Not on file   Other Topics Concern  . Not on file   Social History Narrative   Lives in [place], [place] with wife.      Review of Systems: General: positive for diaphoresis, subjective fever, chills  Cardiovascular: negative for chest pain, dyspnea on exertion, edema, orthopnea, palpitations, paroxysmal nocturnal dyspnea or shortness of breath Dermatological: negative for rash Respiratory: negative for cough or wheezing Urologic: negative for hematuria Abdominal: positive for abdominal pain, nausea and vomiting, negative for diarrhea, bright red blood per rectum, melena, or hematemesis Neurologic: negative  for visual changes, syncope, or dizziness All other systems reviewed and are  otherwise negative except as noted above.  Physical Exam: Blood pressure 131/68, pulse 55, temperature 97.9 F (36.6 C), temperature source Oral, resp. rate 20, height 6' (1.829 m), weight 90.4 kg (199 lb 4.7 oz), SpO2 97.00%.    General: Elderly, somnolent, in no acute distress. Head: Normocephalic, atraumatic, sclera non-icteric, no xanthomas.  Neck: Negative for carotid bruits. JVD not elevated. ICD present in left subclavian area. Lungs: Clear bilaterally to auscultation without wheezes, rales, or rhonchi. Breathing is unlabored. Heart: RRR with S1 S2. No murmurs, rubs, or gallops appreciated. Abdomen: Extensive scarring from prior surgery. Large ventral hernia. Msk:  Strength and tone appears normal for age. Extremities: No clubbing, cyanosis or edema.  Distal pedal pulses are 2+ and equal bilaterally. Neuro: Alert and oriented X 3. Moves all extremities spontaneously. Psych: Responds to questions appropriately with a normal affect.  Labs: Recent Labs  Basename 04/15/11 0338 04/14/11 1417   WBC 5.2 6.5   HGB 13.1 14.7   HCT 37.9* 42.2   MCV 103.3* 103.2*   PLT 65* 77*    Lab 04/15/11 0338 04/14/11 1417  NA 136 137  K 3.7 4.5  CL 100 100  CO2 30 33*  BUN 10 10  CREATININE 0.87 0.90  CALCIUM 9.1 9.8  PROT 5.4* --  BILITOT 4.5* --  ALKPHOS 54 --  ALT 29 --  AST 38* --  AMYLASE -- --  LIPASE 36 --  GLUCOSE 91 154*   Radiology/Studies: Ct Abdomen Pelvis W Contrast  04/14/2011  *RADIOLOGY REPORT*  Clinical Data: Abdominal pain  CT ABDOMEN AND PELVIS WITH CONTRAST  Technique:  Multidetector CT imaging of the abdomen and pelvis was performed following the standard protocol during bolus administration of intravenous contrast.  Contrast: 137m OMNIPAQUE IOHEXOL 300 MG/ML IJ SOLN  Comparison: None.  Findings: Early or partial small bowel obstruction pattern is present.  Proximal small bowel loops are dilated.  Contrast extends through small bowel to a right abdominal hernia.   Small bowel is herniated through the defect.  Fecal like material is inspissated in the small bowel at the level of the obstruction.  See image #38. Small bowel loops traverse back into the peritoneal space then into an additional hernia.  There is probably a level of obstruction in both of these hernia sacs.  Scar tissue surrounds the area.  The hernia is secondary to a defect in the anterior abdominal wall likely from multiple prior surgeries.  There is a dilated bowel loop in the lower mid abdomen on image 56 likely is a result of bowel anastomosis.  Distal small bowel and ileum are decompressed.  Ascending colon protrudes through the hernia defect but there is no evidence of colonic obstruction.  Mild edema involving the ascending colon as well as in the adjacent mesentery is likely related to the dilated small bowel loops rather than primary inflammation of the colon.  Small amount of free fluid is seen anterior to small bowel loops which are dilated on image 36.  There is no free intraperitoneal gas.  Post cholecystectomy.  Small amount of free fluid around the liver. Diffuse hepatic steatosis.  Pancreas, adrenal glands are within normal limits. Tiny hypodensity in the posterior and medial spleen on image 20 of unknown significance.  Tiny liver hypodensity on image 14 of unknown significance.  Calcified granulomata in the liver.  Chronic changes of the kidneys.  Benign-appearing cyst in the upper pole  of the right kidney.  Other hypodensities are too small to characterize  Penetrating atherosclerotic ulcer in the infrarenal aorta is noted on image 45.  Maximal aortic caliber at this level is 3.2 cm.  Mild ectasia of the right and left common iliac artery with caliber is of 1.8 and 1.8 cm respectively.  Portal vein is patent.  Gastroesophageal varices are identified of unknown significance.  Bladder is minimally distended.  Prostate is within normal limits.  Right rectus femoris lipoma.  IMPRESSION: The small  bowel partial or early small bowel obstruction pattern secondary to and anterior abdominal wall hernia and wall defect as described.  There is inflammation within the right side the abdomen and anterior abdomen secondary to this process.  Gastroesophageal varices of unknown significance.  Chronic changes.  Original Report Authenticated By: Jamas Lav, M.D.   Dg Abd Portable 1v  04/15/2011  *RADIOLOGY REPORT*  Clinical Data: Mild distension, pain, nausea.  Small bowel obstruction.  PORTABLE ABDOMEN - 1 VIEW  Comparison: CT abdomen pelvis dated 04/14/2011.  Findings: Enteric tube terminates in the proximal duodenum.  No dilated loops of small bowel are seen.  This appearance is improved from the CT.  Contrast has passed into the colon, precluding complete obstruction.  Sigmoid diverticulosis.  Degenerative changes of the visualized thoracolumbar spine.  ICD leads.  Surgical clips in the upper abdomen.  IMPRESSION: Enteric tube terminates in the proximal duodenum.  Improving/improved partial small bowel obstruction.  Contrast has passed into the colon.  Original Report Authenticated By: Julian Hy, M.D.   EKG: no new tracings  ASSESSMENT AND PLAN:   [name] is a 78yo male with an extensive PMHx including CAD  with resultant ICM, sinus bradycardia secondary to chronotropic incompetence, history of mesenteric thrombosis, HTN, HL, Crohn's disease and ventral hernia who was admitted on 04/14/11 for suspected SBO. From a cardiac standpoint, [name] has no evidence of recent decompensation from his baseline cardiomyopathy. [name] denies ischemic symptoms leading up to this admission. His ICD was recently interrogated with normal functioning. CXR this admission without evidence of congestion and [name] is euvolemic on exam. Given his stable cardiac status, [name] would present as a moderate risk should [name] require surgical correction of SBO.   Signed, R. Valeria Batman, PA-C 04/15/2011, 4:14 PM  Patient seen and  examined and history reviewed. Agree with above findings and plan. History taken and exam above was performed by me. Patient admitted with SBO. [name] has extensive cardiac history as noted above. No recent symptoms of angina, dyspnea, edema, palpitations, or orthopnea. [name] has been compliant with medical Rx and follow up. No recent CHF exacerbation or ACS. Although his chonic cardiac condition does pose moderate perioperative risk [name] is well compensated and is on appropriate cardiac Rx. If surgery is indicated I think [name] would be an acceptable candidate. No further cardiac testing warranted at this time.  Luana Shu 04/15/2011 4:30 PM

## 2011-04-16 ENCOUNTER — Inpatient Hospital Stay (HOSPITAL_COMMUNITY): Payer: Medicare Other

## 2011-04-16 DIAGNOSIS — I2510 Atherosclerotic heart disease of native coronary artery without angina pectoris: Secondary | ICD-10-CM

## 2011-04-16 DIAGNOSIS — I5022 Chronic systolic (congestive) heart failure: Secondary | ICD-10-CM | POA: Diagnosis not present

## 2011-04-16 DIAGNOSIS — R109 Unspecified abdominal pain: Secondary | ICD-10-CM | POA: Diagnosis not present

## 2011-04-16 DIAGNOSIS — Z95810 Presence of automatic (implantable) cardiac defibrillator: Secondary | ICD-10-CM | POA: Diagnosis not present

## 2011-04-16 DIAGNOSIS — K56609 Unspecified intestinal obstruction, unspecified as to partial versus complete obstruction: Secondary | ICD-10-CM | POA: Diagnosis not present

## 2011-04-16 LAB — COMPREHENSIVE METABOLIC PANEL
ALT: 31 U/L (ref 0–53)
Alkaline Phosphatase: 55 U/L (ref 39–117)
BUN: 11 mg/dL (ref 6–23)
CO2: 32 mEq/L (ref 19–32)
Calcium: 9.3 mg/dL (ref 8.4–10.5)
GFR calc Af Amer: >90 mL/min (ref >90)
GFR calc non Af Amer: 81 mL/min — ABNORMAL LOW (ref >90)
Glucose, Bld: 103 mg/dL — ABNORMAL HIGH (ref 70–99)
Potassium: 3.9 mEq/L (ref 3.5–5.1)
Sodium: 137 mEq/L (ref 135–145)
Total Protein: 5.6 g/dL — ABNORMAL LOW (ref 6.0–8.3)

## 2011-04-16 LAB — CBC
Hemoglobin: 13.3 g/dL (ref 13.0–17.0)
MCHC: 35 g/dL (ref 30.0–36.0)
RDW: 16.1 % — ABNORMAL HIGH (ref 11.5–15.5)
WBC: 5 10*3/uL (ref 4.0–10.5)

## 2011-04-16 LAB — HEPARIN LEVEL (UNFRACTIONATED): Heparin Unfractionated: 0.45 IU/mL (ref 0.30–0.70)

## 2011-04-16 MED ORDER — HEPARIN (PORCINE) IN NACL 100-0.45 UNIT/ML-% IJ SOLN
16.0000 [IU]/kg/h | INTRAMUSCULAR | Status: DC
  Administered 2011-04-16: 16 [IU]/kg/h via INTRAVENOUS
  Filled 2011-04-16 (×4): qty 250

## 2011-04-16 MED ORDER — POTASSIUM CL IN DEXTROSE 5% 20 MEQ/L IV SOLN
20.0000 meq | INTRAVENOUS | Status: AC
  Administered 2011-04-16: 20 meq via INTRAVENOUS
  Filled 2011-04-16: qty 1000

## 2011-04-16 MED ORDER — METOPROLOL TARTRATE 1 MG/ML IV SOLN
2.5000 mg | Freq: Four times a day (QID) | INTRAVENOUS | Status: DC
  Administered 2011-04-16 – 2011-04-17 (×4): 2.5 mg via INTRAVENOUS
  Filled 2011-04-16 (×10): qty 5

## 2011-04-16 MED ORDER — POLYETHYLENE GLYCOL 3350 17 G PO PACK
17.0000 g | PACK | Freq: Every day | ORAL | Status: DC
  Administered 2011-04-16 – 2011-04-18 (×3): 17 g via NASOGASTRIC
  Filled 2011-04-16 (×4): qty 1

## 2011-04-16 NOTE — Progress Notes (Signed)
ANTICOAGULATION CONSULT NOTE - Initial Consult  Pharmacy Consult for Heparin Indication: Off coumadin; H/O mesenteric thrombosis  No Known Allergies  Patient Measurements: Height: 6' (182.9 cm) Weight: 193 lb (87.544 kg) IBW/kg (Calculated) : 77.6  Heparin Dosing Weight:   Vital Signs: Temp: 98.4 F (36.9 C) (03/16 0625) Temp src: Oral (03/16 0625) BP: 130/73 mmHg (03/16 0625) Pulse Rate: 60  (03/16 0625)  Labs:  Basename 04/16/11 0525 04/15/11 0338 04/14/11 1417  HGB 13.3 13.1 --  HCT 38.0* 37.9* 42.2  PLT 68* 65* 77*  APTT -- -- --  LABPROT 21.9* 28.2* 26.9*  INR 1.88* 2.59* 2.44*  HEPARINUNFRC -- -- --  CREATININE 0.88 0.87 0.90  CKTOTAL -- -- --  CKMB -- -- --  TROPONINI -- -- --   Estimated Creatinine Clearance: 77.2 ml/min (by C-G formula based on Cr of 0.88).  Medical History: Past Medical History  Diagnosis Date  . Dizziness     Positional dizziness  . Hypertension   . Ischemic cardiomyopathy     CABG 1994 CAD catherization 1996.[name] occluded vein graft to the diagnol, other grafts patent/  catherization 2006, no PCI/ nuclear.[name] october 2011.[name] extensive scar anteroseptal and apical.. no ischemia    . CHF (congestive heart failure)     chronic systolic  . Mitral regurgitation     mild.[name] echo november 2011  EF 35%...echo.. november 2009/ ef 35%.[name] echo november 2011  . Sinus bradycardia   . Chronotropic incompetence   . Biventricular implantable cardiac defibrillator in situ     GDT CONTAK H170-MADIT-CRT-EXPLANTED 2011 implanted defibrillator- Guidant cognis, model n119-2001 Dr. Caryl Comes (11/28/2009)  . LBBB (left bundle branch block)   . Hyperlipemia   . GERD (gastroesophageal reflux disease)   . Colonic polyp   . Crohn's disease   . Acute vascular insufficiency of intestine     Mesenteric embolus...surgery 2003  . Ventral hernia   . Hypertrophy of prostate with urinary obstruction and other lower urinary tract symptoms (LUTS)   . Degenerative joint  disease   . Polymyalgia rheumatica   . Tremor   . Anxiety   . Shingles   . Vitamin B12 deficiency   . CAD (coronary artery disease)     CABG 1994  /   catheterization 1996, occluded vein graft to the diagonal, other grafts patent  /   catheterization 2006, no PCI  /  nuclear, October, 2011, extensive scar anteroseptal and apical, no ischemia  . Hx of CABG     1994  . Ejection fraction     EF 35%, echo, November, 2011  . Warfarin anticoagulation     Coumadin therapy for mesenteric embolus 2003  . Carotid artery disease     Doppler, February,  2012, 0-39% bilateral, recommended followup 1 year    Medications:  Infusions:    . dextrose 50 mL/hr at 04/15/11 0925  . heparin    . DISCONTD: dextrose 50 mL/hr at 04/14/11 2346    Assessment: Patient on chronic warfarin for DVT.  Off warfarin and MD wishes pharmacy to dose heparin once INR < 2.  INR 1.88  Goal of Therapy:  Heparin level 0.3-0.7 units/ml   Plan:  Heparin withOUT bolus at 1400 units/hr.  8hr heparin level and daily heparin level and CBC.  Tyler Deis, Shea Stakes Crowford 04/16/2011,8:24 AM

## 2011-04-16 NOTE — Progress Notes (Addendum)
Subjective: Alert and stable. Denies abdominal pain or nausea. Passing lots of flatus. No stool since Wednesday, 72 hours ago.  Abdominal x-rays showed no evidence of obstruction. Loss of contrast in the colon.NG output declined to 725 cc per 24 hours.  WBC 5000.  Patient well known to me from prior surgical events.  Objective: Vital signs in last 24 hours: Temp:  [98.4 F (36.9 C)-99.1 F (37.3 C)] 98.4 F (36.9 C) (03/16 0625) Pulse Rate:  [55-61] 60  (03/16 0625) Resp:  [15-24] 24  (03/16 0625) BP: (121-135)/(64-74) 130/73 mmHg (03/16 0625) SpO2:  [90 %-97 %] 93 % (03/16 0625) Weight:  [193 lb (87.544 kg)] 193 lb (87.544 kg) (03/16 0625)    Intake/Output from previous day: 03/15 0701 - 03/16 0700 In: 510 [I.V.:500; IV Piggyback:10] Out: 1075 [Urine:350; Emesis/NG output:725] Intake/Output this shift: Total I/O In: -  Out: 300 [Urine:300]  General appearance: alert. Slightly deconditioned. Oriented. In no distress. GI: abdomen is flat, soft, nondistended, nontender. Multiple hernias all easily reducible. NG drainage is thin and bilious.  Lab Results:   Basename 04/16/11 0525 04/15/11 0338  WBC 5.0 5.2  HGB 13.3 13.1  HCT 38.0* 37.9*  PLT 68* 65*   BMET  Basename 04/16/11 0525 04/15/11 0338  NA 137 136  K 3.9 3.7  CL 100 100  CO2 32 30  GLUCOSE 103* 91  BUN 11 10  CREATININE 0.88 0.87  CALCIUM 9.3 9.1   PT/INR  Basename 04/16/11 0525 04/15/11 0338  LABPROT 21.9* 28.2*  INR 1.88* 2.59*   ABG No results found for this basename: PHART:2,PCO2:2,PO2:2,HCO3:2 in the last 72 hours  Studies/Results: Ct Abdomen Pelvis W Contrast  04/14/2011  *RADIOLOGY REPORT*  Clinical Data: Abdominal pain  CT ABDOMEN AND PELVIS WITH CONTRAST  Technique:  Multidetector CT imaging of the abdomen and pelvis was performed following the standard protocol during bolus administration of intravenous contrast.  Contrast: 153m OMNIPAQUE IOHEXOL 300 MG/ML IJ SOLN  Comparison:  None.  Findings: Early or partial small bowel obstruction pattern is present.  Proximal small bowel loops are dilated.  Contrast extends through small bowel to a right abdominal hernia.  Small bowel is herniated through the defect.  Fecal like material is inspissated in the small bowel at the level of the obstruction.  See image #38. Small bowel loops traverse back into the peritoneal space then into an additional hernia.  There is probably a level of obstruction in both of these hernia sacs.  Scar tissue surrounds the area.  The hernia is secondary to a defect in the anterior abdominal wall likely from multiple prior surgeries.  There is a dilated bowel loop in the lower mid abdomen on image 56 likely is a result of bowel anastomosis.  Distal small bowel and ileum are decompressed.  Ascending colon protrudes through the hernia defect but there is no evidence of colonic obstruction.  Mild edema involving the ascending colon as well as in the adjacent mesentery is likely related to the dilated small bowel loops rather than primary inflammation of the colon.  Small amount of free fluid is seen anterior to small bowel loops which are dilated on image 36.  There is no free intraperitoneal gas.  Post cholecystectomy.  Small amount of free fluid around the liver. Diffuse hepatic steatosis.  Pancreas, adrenal glands are within normal limits. Tiny hypodensity in the posterior and medial spleen on image 20 of unknown significance.  Tiny liver hypodensity on image 14 of unknown significance.  Calcified  granulomata in the liver.  Chronic changes of the kidneys.  Benign-appearing cyst in the upper pole of the right kidney.  Other hypodensities are too small to characterize  Penetrating atherosclerotic ulcer in the infrarenal aorta is noted on image 45.  Maximal aortic caliber at this level is 3.2 cm.  Mild ectasia of the right and left common iliac artery with caliber is of 1.8 and 1.8 cm respectively.  Portal vein is patent.   Gastroesophageal varices are identified of unknown significance.  Bladder is minimally distended.  Prostate is within normal limits.  Right rectus femoris lipoma.  IMPRESSION: The small bowel partial or early small bowel obstruction pattern secondary to and anterior abdominal wall hernia and wall defect as described.  There is inflammation within the right side the abdomen and anterior abdomen secondary to this process.  Gastroesophageal varices of unknown significance.  Chronic changes.  Original Report Authenticated By: Jamas Lav, M.D.   Dg Abd Portable 1v  04/16/2011  *RADIOLOGY REPORT*  Clinical Data: Abdominal pain and discomfort  PORTABLE ABDOMEN - 1 VIEW  Comparison: Plain film 04/15/2011  Findings: NG tube is within the gastric antrum.  Oral contrast within the colon is not changed.  No dilated loops of large or small bowel.  IMPRESSION:  1.  No interval change. 2.  N G tube in stomach.  Original Report Authenticated By: Suzy Bouchard, M.D.   Dg Abd Portable 1v  04/15/2011  *RADIOLOGY REPORT*  Clinical Data: Mild distension, pain, nausea.  Small bowel obstruction.  PORTABLE ABDOMEN - 1 VIEW  Comparison: CT abdomen pelvis dated 04/14/2011.  Findings: Enteric tube terminates in the proximal duodenum.  No dilated loops of small bowel are seen.  This appearance is improved from the CT.  Contrast has passed into the colon, precluding complete obstruction.  Sigmoid diverticulosis.  Degenerative changes of the visualized thoracolumbar spine.  ICD leads.  Surgical clips in the upper abdomen.  IMPRESSION: Enteric tube terminates in the proximal duodenum.  Improving/improved partial small bowel obstruction.  Contrast has passed into the colon.  Original Report Authenticated By: Julian Hy, M.D.    Anti-infectives: Anti-infectives    None      Assessment/Plan:  Partial SBO, possibly secondary to transient incarceration of ventral hernia, resolved. Will give Miralax down the NG tube, and  then remove the NG tube and begin clear liquid diet.  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  History complex ventral hernia repair  Coronary Artery disease and ischemic cardiomyopathy  Congestive heart failure  Biventricular implantable cardiac defibrillation defibrillator in situ  Crohn's disease  Coumadin anticoagulation  Poor surgical candidate. We would avoid operating on his abdomen unless there is no other option due to risk..   LOS: 2 days    Shaiden Aldous M. [name], M.D., [facility] General and Minimally invasive Surgery Breast and Colorectal Surgery Office:   [phone] Pager:   [phone]  04/16/2011

## 2011-04-16 NOTE — Progress Notes (Signed)
ANTICOAGULATION CONSULT NOTE - Follow Up Consult  Pharmacy Consult for Heparin Indication: hx mesenteric clot.   No Known Allergies  Patient Measurements: Height: 6' (182.9 cm) Weight: 193 lb (87.544 kg) IBW/kg (Calculated) : 77.6   Vital Signs: Temp: 96.8 F (36 C) (03/16 1309) Temp src: Oral (03/16 1309) BP: 128/67 mmHg (03/16 1309) Pulse Rate: 57  (03/16 1309)  Labs:  Basename 04/16/11 1759 04/16/11 0525 04/15/11 0338 04/14/11 1417  HGB -- 13.3 13.1 --  HCT -- 38.0* 37.9* 42.2  PLT -- 68* 65* 77*  APTT -- -- -- --  LABPROT -- 21.9* 28.2* 26.9*  INR -- 1.88* 2.59* 2.44*  HEPARINUNFRC 0.45 -- -- --  CREATININE -- 0.88 0.87 0.90  CKTOTAL -- -- -- --  CKMB -- -- -- --  TROPONINI -- -- -- --   Estimated Creatinine Clearance: 77.2 ml/min (by C-G formula based on Cr of 0.88).  Medications:  Prescriptions prior to admission  Medication Sig Dispense Refill  . aspirin 81 MG tablet Take 81 mg by mouth daily.        . carvedilol (COREG) 6.25 MG tablet Take 1 tablet (6.25 mg total) by mouth 2 (two) times daily with a meal. 1 tablet twice a day  180 tablet  3  . cholecalciferol (VITAMIN D) 1000 UNITS tablet Take 1,000-2,000 Units by mouth daily. 1-2 tablets once a day      . Cyanocobalamin (B-12) 1000 MCG CAPS Take 1 capsule by mouth daily.        . folic acid (FOLVITE) 1 MG tablet Take 1 mg by mouth daily.        . furosemide (LASIX) 40 MG tablet Take 1 tablet (40 mg total) by mouth daily. One tablet once a day  90 tablet  3  . hydrocortisone (ANUSOL-HC) 2.5 % rectal cream Place 1 application rectally 2 (two) times daily. Use as directed      . lisinopril (PRINIVIL,ZESTRIL) 10 MG tablet Take 1 tablet (10 mg total) by mouth daily.  90 tablet  3  . mercaptopurine (PURINETHOL) 50 MG tablet Take 75 mg by mouth daily. Take 1.5 tabs by mouth once daily as directed by Dr. Su Ley      . simvastatin (ZOCOR) 20 MG tablet Take 1 tablet (20 mg total) by mouth at bedtime.  90 tablet  3    . spironolactone (ALDACTONE) 25 MG tablet Take 1 tablet (25 mg total) by mouth daily. One tablet once a day  90 tablet  3  . warfarin (COUMADIN) 5 MG tablet Take 15-20 mg by mouth daily. TAKES 20MG ON [facility] AND Friday,AND TAKES 15MG Tuesday Thursday Saturday AND Sunday       Scheduled:    . sodium chloride   Intravenous STAT  . antiseptic oral rinse  15 mL Mouth Rinse BID  . aspirin  150 mg Rectal Daily  . hydrocortisone  1 application Rectal BID  . metoprolol  2.5 mg Intravenous Q6H  . pantoprazole (PROTONIX) IV  40 mg Intravenous Q12H  . polyethylene glycol  17 g Per NG tube Daily  . sodium chloride  3 mL Intravenous Q12H   Assessment:  78yo M with partial SBO.  Switched from chronic Coumadin to heparin infusion in case surgery is necessary.  First heparin level is in target range on current rate. No bleeding reported/documented.  Goal of Therapy:  Heparin level 0.3-0.7 units/ml   Plan:   Cont heparin at 1400units/hr.  Check heparin level q am.  Alvino Chapel,  Sabino Niemann 04/16/2011,6:27 PM

## 2011-04-16 NOTE — Progress Notes (Signed)
[name] [id number] is a 78 y.o. male,  Outpatient Primary MD for the patient is [name] Space, MD, MD  Chief Complaint  Patient presents with  . Abdominal Pain  . Hernia    right abd        Subjective:   [name] today has, No headache, No chest pain, No abdominal pain - No Nausea, No new weakness tingling or numbness, No Cough - SOB.    Objective:   Filed Vitals:   04/15/11 1600 04/15/11 1650 04/15/11 2317 04/16/11 0625  BP: 127/66 135/74 130/64 130/73  Pulse: 59 61 61 60  Temp:  98.4 F (36.9 C) 99.1 F (37.3 C) 98.4 F (36.9 C)  TempSrc:  Oral Oral Oral  Resp: 23 16 24 24   Height:      Weight:    87.544 kg (193 lb)  SpO2: 97% 95% 90% 93%    Wt Readings from Last 3 Encounters:  04/16/11 87.544 kg (193 lb)  04/14/11 92.443 kg (203 lb 12.8 oz)  03/07/11 92.08 kg (203 lb)     Intake/Output Summary (Last 24 hours) at 04/16/11 1102 Last data filed at 04/16/11 0917  Gross per 24 hour  Intake    250 ml  Output   1175 ml  Net   -925 ml    Exam Awake Alert, Oriented *3, No new F.N deficits, Normal affect [place].AT,PERRAL Supple Neck,No JVD, No cervical lymphadenopathy appriciated.  Symmetrical Chest wall movement, Good air movement bilaterally, CTAB RRR,No Gallops,Rubs or new Murmurs, No Parasternal Heave +ve B.Sounds, Abd Soft, Non tender, No organomegaly appriciated, No rebound -guarding or rigidity, Multiple ventral incisional herniae, reducible , NG in place  No Cyanosis, Clubbing or edema, No new Rash or bruise     Data Review  CBC  Lab 04/16/11 0525 04/15/11 0338 04/14/11 1417  WBC 5.0 5.2 6.5  HGB 13.3 13.1 14.7  HCT 38.0* 37.9* 42.2  PLT 68* 65* 77*  MCV 102.7* 103.3* 103.2*  MCH 35.9* 35.7* 35.9*  MCHC 35.0 34.6 34.8  RDW 16.1* 16.3* 16.1*  LYMPHSABS -- -- 0.7  MONOABS -- -- 0.5  EOSABS -- -- 0.0  BASOSABS -- -- 0.0  BANDABS -- -- --    Chemistries   Lab 04/16/11 0525 04/15/11 0338 04/14/11 1417  NA 137 136 137  K  3.9 3.7 4.5  CL 100 100 100  CO2 32 30 33*  GLUCOSE 103* 91 154*  BUN 11 10 10   CREATININE 0.88 0.87 0.90  CALCIUM 9.3 9.1 9.8  MG -- -- --  AST 42* 38* 46*  ALT 31 29 36  ALKPHOS 55 54 68  BILITOT 5.6* 4.5* 4.9*   ------------------------------------------------------------------------------------------------------------------ estimated creatinine clearance is 77.2 ml/min (by C-G formula based on Cr of 0.88). ------------------------------------------------------------------------------------------------------------------ No results found for this basename: HGBA1C:2 in the last 72 hours ------------------------------------------------------------------------------------------------------------------ No results found for this basename: CHOL:2,HDL:2,LDLCALC:2,TRIG:2,CHOLHDL:2,LDLDIRECT:2 in the last 72 hours ------------------------------------------------------------------------------------------------------------------ No results found for this basename: TSH,T4TOTAL,FREET3,T3FREE,THYROIDAB in the last 72 hours ------------------------------------------------------------------------------------------------------------------ No results found for this basename: VITAMINB12:2,FOLATE:2,FERRITIN:2,TIBC:2,IRON:2,RETICCTPCT:2 in the last 72 hours  Coagulation profile  Lab 04/16/11 0525 04/15/11 0338 04/14/11 1417  INR 1.88* 2.59* 2.44*  PROTIME -- -- --    No results found for this basename: DDIMER:2 in the last 72 hours  Cardiac Enzymes No results found for this basename: CK:3,CKMB:3,TROPONINI:3,MYOGLOBIN:3 in the last 168 hours ------------------------------------------------------------------------------------------------------------------ No components found with this basename: POCBNP:3  Micro Results Recent Results (from the past 240 hour(s))  MRSA PCR SCREENING  Status: Normal   Collection Time   04/14/11 10:24 PM      Component Value Range Status Comment   MRSA by PCR  NEGATIVE  NEGATIVE  Final     Radiology Reports Ct Abdomen Pelvis W Contrast  04/14/2011  *RADIOLOGY REPORT*  Clinical Data: Abdominal pain  CT ABDOMEN AND PELVIS WITH CONTRAST  Technique:  Multidetector CT imaging of the abdomen and pelvis was performed following the standard protocol during bolus administration of intravenous contrast.  Contrast: 175m OMNIPAQUE IOHEXOL 300 MG/ML IJ SOLN  Comparison: None.  Findings: Early or partial small bowel obstruction pattern is present.  Proximal small bowel loops are dilated.  Contrast extends through small bowel to a right abdominal hernia.  Small bowel is herniated through the defect.  Fecal like material is inspissated in the small bowel at the level of the obstruction.  See image #38. Small bowel loops traverse back into the peritoneal [name] then into an additional hernia.  There is probably a level of obstruction in both of these hernia sacs.  Scar tissue surrounds the area.  The hernia is secondary to a defect in the anterior abdominal wall likely from multiple prior surgeries.  There is a dilated bowel loop in the lower mid abdomen on image 56 likely is a result of bowel anastomosis.  Distal small bowel and ileum are decompressed.  Ascending colon protrudes through the hernia defect but there is no evidence of colonic obstruction.  Mild edema involving the ascending colon as well as in the adjacent mesentery is likely related to the dilated small bowel loops rather than primary inflammation of the colon.  Small amount of free fluid is seen anterior to small bowel loops which are dilated on image 36.  There is no free intraperitoneal gas.  Post cholecystectomy.  Small amount of free fluid around the liver. Diffuse hepatic steatosis.  Pancreas, adrenal glands are within normal limits. Tiny hypodensity in the posterior and medial spleen on image 20 of unknown significance.  Tiny liver hypodensity on image 14 of unknown significance.  Calcified granulomata in the  liver.  Chronic changes of the kidneys.  Benign-appearing cyst in the upper pole of the right kidney.  Other hypodensities are too small to characterize  Penetrating atherosclerotic ulcer in the infrarenal aorta is noted on image 45.  Maximal aortic caliber at this level is 3.2 cm.  Mild ectasia of the right and left common iliac artery with caliber is of 1.8 and 1.8 cm respectively.  Portal vein is patent.  Gastroesophageal varices are identified of unknown significance.  Bladder is minimally distended.  Prostate is within normal limits.  Right rectus femoris lipoma.  IMPRESSION: The small bowel partial or early small bowel obstruction pattern secondary to and anterior abdominal wall hernia and wall defect as described.  There is inflammation within the right side the abdomen and anterior abdomen secondary to this process.  Gastroesophageal varices of unknown significance.  Chronic changes.  Original Report Authenticated By: AJamas Lav M.D.    Scheduled Meds:    . sodium chloride   Intravenous STAT  . antiseptic oral rinse  15 mL Mouth Rinse BID  . aspirin  150 mg Rectal Daily  . hydrocortisone  1 application Rectal BID  . metoprolol  2.5 mg Intravenous Q6H  . pantoprazole (PROTONIX) IV  40 mg Intravenous Q12H  . sodium chloride  3 mL Intravenous Q12H   Continuous Infusions:    . dextrose 50 mL/hr at 04/15/11 0925  .  dextrose 5 % with KCl 20 mEq / L    . heparin 16 Units/kg/hr (04/16/11 0915)   PRN Meds:.albuterol, benzocaine, guaiFENesin-dextromethorphan, metoprolol, morphine, ondansetron (ZOFRAN) IV, ondansetron, phenol  Assessment & Plan   1.Abdominal pain secondary to small bowel obstruction in a patient with history of Crohn's disease - likely secondary to herniae and adhesions from prior surgical procedures has multiple ventral incisional herniae, which are reducible - continue NG tube, he feels better and he is pain free, his abdomen is soft, he seems to have some bowel sounds  at this point, we'll continue to keep him n.p.o., hold his Coumadin, although now i hear more B sounds and he is passing some gas, ? If we can take out NG and monitor, Surg to see today. KUB reordered. Add IV PPI. D/W Dr [name] 04-16-11 he will see him soon.  In case patient gets worse and needs surgery he would be a high-risk candidate for cardiopulmonary complication which have been explained to the family, didn't accept this risk and recognized this risk. Patient's Coumadin will be had once his INR is under 2 he will be started on heparin drip per protocol.   Cardio-Pulm Risk stratification for surgery and recommendations to minimize the same:-   A.Cardio-Pulmonary Risk -  this patient is a  high for adverse Cardio-Pulmonary  Outcome  from surgery, the risks and benefits were discussed and acceptable to patient wife and family members.   Recommendations for optimizing Cardio-Pulmonary  Risk risk factors   1. Keep SBP<140, HR<85, use Lopressor 20m IV q4hrs PRN, or B.Blocker drip PRN. 2. Tight I&O goal to keep even. 3. Minimal sedation. 4. Good pulmunary toilet. 5. Scheduled/PRN Nebs ordered keep Pox>90% 6. Hb>8, transfuse as needed- Lasix 123mIV after each unit PRBC Transfused. 7.Bleeding Risk - no previous surgical complications, no easy bruising, INR 2.4, will be on aspirin suppository, heparin drip and and I would be checked on a daily basis.      2. History of ischemic cardiomyopathy with Chronic systolic CHF EF 3444%nd [id number], Patient is status post AICD placement.  -  Currently he is pain-free , compensated, no rales trace bipedal edema, he is n.p.o. due to #1 above, continue gentle D5 IV fluid, as well as observation of his lung exam and oxygen demands, daily weights and I.'s and also, IV Lasix if patient's fluid status appears to be on the positive side. [facility] for any further input patient sees Dr. KaRon ParkerPRN IV Lopressor.    3. History of  mesenteric ischemia - for now Coumadin will be had, once INR is under 2 heparin drip will be started, daily INR will be checked. If patient needs emergent surgery heparin drip will be stopped if ionized above 1.7 FFP his units can be given.    4. History of Crohn's disease patient gets a routine colonoscopy as outpatient for now his by mouth medications will be held due to #1 if need be GI will be consulted.Have D/W Dr PyHilarie Fredricksono further changes suggested.     5. Elevated lipase unclear etiology no signs of pancreatitis on CT scan history not suggestive of acute pancreatitis - NML lipase now.    6. Nonspecific spleen liver and Abdominal Aorta noted on CT-  outpatient GI followup would be recommended.     DVT Prophylaxis  Couamdin - Heparin - SCDs     SIThurnell Lose.D on 04/16/2011 at 11:02 AM  [place]  [phone]    

## 2011-04-16 NOTE — Progress Notes (Signed)
@   Subjective:  Denies CP or dyspnea; mild abdominal pain; some flatus   Objective:  Filed Vitals:   04/15/11 1600 04/15/11 1650 04/15/11 2317 04/16/11 0625  BP: 127/66 135/74 130/64 130/73  Pulse: 59 61 61 60  Temp:  98.4 F (36.9 C) 99.1 F (37.3 C) 98.4 F (36.9 C)  TempSrc:  Oral Oral Oral  Resp: 23 16 24 24   Height:      Weight:    193 lb (87.544 kg)  SpO2: 97% 95% 90% 93%    Intake/Output from previous day:  Intake/Output Summary (Last 24 hours) at 04/16/11 0735 Last data filed at 04/15/11 2300  Gross per 24 hour  Intake    510 ml  Output   1075 ml  Net   -565 ml    Physical Exam: Physical exam: Well-developed well-nourished in no acute distress.  Skin is warm and dry.  HEENT is normal.  Neck is supple. No thyromegaly.  Chest with mild basilar crackles Cardiovascular exam is regular rate and rhythm.  Abdominal exam mild tenderness; not distended Extremities show no edema. neuro grossly intact    Lab Results: Basic Metabolic Panel:  Basename 04/16/11 0525 04/15/11 0338  NA 137 136  K 3.9 3.7  CL 100 100  CO2 32 30  GLUCOSE 103* 91  BUN 11 10  CREATININE 0.88 0.87  CALCIUM 9.3 9.1  MG -- --  PHOS -- --   CBC:  Basename 04/16/11 0525 04/15/11 0338 04/14/11 1417  WBC 5.0 5.2 --  NEUTROABS -- -- 5.2  HGB 13.3 13.1 --  HCT 38.0* 37.9* --  MCV 102.7* 103.3* --  PLT 68* 65* --     Assessment/Plan:  1) Preop - no further preop ischemia eval if surg needed; see Dr Doug Sou note 2) ICM - would begin IV lopressor while NPO; resume all preadmission cardiac meds when tolerating. Careful with IVFs 3) H/O mesenteric thrombosis - coumadin on hold; would begin heparin; resume coumadin when it is clear no procedure needed. 4) SBO - management per surgery. 5) S/P ICD We will see again Monday; please call prior to then with questions. Kirk Ruths 04/16/2011, 7:35 AM

## 2011-04-17 DIAGNOSIS — I5022 Chronic systolic (congestive) heart failure: Secondary | ICD-10-CM | POA: Diagnosis not present

## 2011-04-17 DIAGNOSIS — I2510 Atherosclerotic heart disease of native coronary artery without angina pectoris: Secondary | ICD-10-CM | POA: Diagnosis not present

## 2011-04-17 DIAGNOSIS — K436 Other and unspecified ventral hernia with obstruction, without gangrene: Secondary | ICD-10-CM | POA: Diagnosis not present

## 2011-04-17 DIAGNOSIS — Z95810 Presence of automatic (implantable) cardiac defibrillator: Secondary | ICD-10-CM | POA: Diagnosis not present

## 2011-04-17 DIAGNOSIS — K56609 Unspecified intestinal obstruction, unspecified as to partial versus complete obstruction: Secondary | ICD-10-CM | POA: Diagnosis not present

## 2011-04-17 LAB — COMPREHENSIVE METABOLIC PANEL
Albumin: 3 g/dL — ABNORMAL LOW (ref 3.5–5.2)
BUN: 8 mg/dL (ref 6–23)
Creatinine, Ser: 0.82 mg/dL (ref 0.50–1.35)
Total Protein: 5.6 g/dL — ABNORMAL LOW (ref 6.0–8.3)

## 2011-04-17 LAB — CBC
HCT: 39.2 % (ref 39.0–52.0)
Hemoglobin: 13.8 g/dL (ref 13.0–17.0)
MCV: 102.6 fL — ABNORMAL HIGH (ref 78.0–100.0)
RBC: 3.82 MIL/uL — ABNORMAL LOW (ref 4.22–5.81)
WBC: 3.9 10*3/uL — ABNORMAL LOW (ref 4.0–10.5)

## 2011-04-17 LAB — PROTIME-INR
INR: 1.51 — ABNORMAL HIGH (ref 0.00–1.49)
Prothrombin Time: 18.5 seconds — ABNORMAL HIGH (ref 11.6–15.2)

## 2011-04-17 MED ORDER — HEPARIN (PORCINE) IN NACL 100-0.45 UNIT/ML-% IJ SOLN
1200.0000 [IU]/h | INTRAMUSCULAR | Status: DC
  Administered 2011-04-17 (×2): 1200 [IU]/h via INTRAVENOUS
  Filled 2011-04-17 (×3): qty 250

## 2011-04-17 MED ORDER — CARVEDILOL 3.125 MG PO TABS
3.1250 mg | ORAL_TABLET | Freq: Two times a day (BID) | ORAL | Status: DC
  Administered 2011-04-18 – 2011-04-19 (×3): 3.125 mg via ORAL
  Filled 2011-04-17 (×5): qty 1

## 2011-04-17 MED ORDER — ASPIRIN EC 81 MG PO TBEC
81.0000 mg | DELAYED_RELEASE_TABLET | Freq: Every day | ORAL | Status: DC
  Administered 2011-04-17 – 2011-04-19 (×3): 81 mg via ORAL
  Filled 2011-04-17 (×3): qty 1

## 2011-04-17 MED ORDER — WARFARIN - PHARMACIST DOSING INPATIENT: Freq: Every day | Status: DC

## 2011-04-17 MED ORDER — FUROSEMIDE 40 MG PO TABS
40.0000 mg | ORAL_TABLET | Freq: Every day | ORAL | Status: DC
  Administered 2011-04-17 – 2011-04-19 (×3): 40 mg via ORAL
  Filled 2011-04-17 (×3): qty 1

## 2011-04-17 MED ORDER — WARFARIN SODIUM 10 MG PO TABS
20.0000 mg | ORAL_TABLET | Freq: Once | ORAL | Status: AC
  Administered 2011-04-17: 20 mg via ORAL
  Filled 2011-04-17: qty 2

## 2011-04-17 MED ORDER — SIMVASTATIN 20 MG PO TABS
20.0000 mg | ORAL_TABLET | Freq: Every day | ORAL | Status: DC
  Administered 2011-04-17 – 2011-04-18 (×2): 20 mg via ORAL
  Filled 2011-04-17 (×3): qty 1

## 2011-04-17 MED ORDER — ASPIRIN 81 MG PO TABS: 81.0000 mg | ORAL_TABLET | Freq: Every day | ORAL | Status: DC

## 2011-04-17 NOTE — Progress Notes (Addendum)
ANTICOAGULATION CONSULT NOTE - Follow Up Consult  Pharmacy Consult for Heparin/Coumadin Indication: hx mesenteric clot.   No Known Allergies  Patient Measurements: Height: 6' (182.9 cm) Weight: 191 lb 12.8 oz (87 kg) IBW/kg (Calculated) : 77.6   Vital Signs: Temp: 97.8 F (36.6 C) (03/17 0638) Temp src: Oral (03/17 0638) BP: 132/75 mmHg (03/17 0638) Pulse Rate: 60  (03/17 0638)  Labs:  Basename 04/17/11 2229 04/16/11 1759 04/16/11 0525 04/15/11 0338 04/14/11 1417  HGB 13.8 -- 13.3 -- --  HCT 39.2 -- 38.0* 37.9* --  PLT 77* -- 68* 65* --  APTT -- -- -- -- --  LABPROT 18.5* -- 21.9* 28.2* --  INR 1.51* -- 1.88* 2.59* --  HEPARINUNFRC 0.82* 0.45 -- -- --  CREATININE -- -- 0.88 0.87 0.90  CKTOTAL -- -- -- -- --  CKMB -- -- -- -- --  TROPONINI -- -- -- -- --   Estimated Creatinine Clearance: 77.2 ml/min (by C-G formula based on Cr of 0.88).  Medications:  Prescriptions prior to admission  Medication Sig Dispense Refill  . aspirin 81 MG tablet Take 81 mg by mouth daily.        . carvedilol (COREG) 6.25 MG tablet Take 1 tablet (6.25 mg total) by mouth 2 (two) times daily with a meal. 1 tablet twice a day  180 tablet  3  . cholecalciferol (VITAMIN D) 1000 UNITS tablet Take 1,000-2,000 Units by mouth daily. 1-2 tablets once a day      . Cyanocobalamin (B-12) 1000 MCG CAPS Take 1 capsule by mouth daily.        . folic acid (FOLVITE) 1 MG tablet Take 1 mg by mouth daily.        . furosemide (LASIX) 40 MG tablet Take 1 tablet (40 mg total) by mouth daily. One tablet once a day  90 tablet  3  . hydrocortisone (ANUSOL-HC) 2.5 % rectal cream Place 1 application rectally 2 (two) times daily. Use as directed      . lisinopril (PRINIVIL,ZESTRIL) 10 MG tablet Take 1 tablet (10 mg total) by mouth daily.  90 tablet  3  . mercaptopurine (PURINETHOL) 50 MG tablet Take 75 mg by mouth daily. Take 1.5 tabs by mouth once daily as directed by Dr. Su Ley      . simvastatin (ZOCOR) 20 MG tablet  Take 1 tablet (20 mg total) by mouth at bedtime.  90 tablet  3  . spironolactone (ALDACTONE) 25 MG tablet Take 1 tablet (25 mg total) by mouth daily. One tablet once a day  90 tablet  3  . warfarin (COUMADIN) 5 MG tablet Take 15-20 mg by mouth daily. TAKES 20MG ON [facility] AND Friday,AND TAKES 15MG Tuesday Thursday Saturday AND Sunday       Scheduled:     . antiseptic oral rinse  15 mL Mouth Rinse BID  . aspirin  150 mg Rectal Daily  . hydrocortisone  1 application Rectal BID  . metoprolol  2.5 mg Intravenous Q6H  . pantoprazole (PROTONIX) IV  40 mg Intravenous Q12H  . polyethylene glycol  17 g Per NG tube Daily  . sodium chloride  3 mL Intravenous Q12H   Assessment:  78yo M with partial SBO.  Switched from chronic Coumadin to heparin infusion in case surgery is necessary - Poor surgical candidate noted by GI, will await further orders regarding anticoagulation plan.   Heparin level slightly above goal No bleeding reported/documented. Plt Low but stable INR decreasing off coumadin,  1.51 now  Goal of Therapy:  Heparin level 0.3-0.7 units/ml   Plan:   Decrease heparin to 1200 units/hr.  Check heparin level in 8 hours    ADDENDUM:  Continue with current IV heparin, will D/C when INR > 2  Restart coumadin for H/O Mesenteric clot  Home dose = 20 mg on MWF and 15 mg on TTSS; Last dose 3/13.   Currently INR subtherapeutic (1.51) secondary to holding coumadin   Coumadin 20 mg x 1 tonight, Daily PT/INR   Aundrea Higginbotham, Gaye Alken PharmD 11:13 AM 04/17/2011

## 2011-04-17 NOTE — Progress Notes (Signed)
[name] KQA:[id number]:[id number] is a 78 y.o. male,  Outpatient Primary MD for the patient is [name] Space, MD, MD  Chief Complaint  Patient presents with  . Abdominal Pain  . Hernia    right abd        Subjective:    [name] today has, No headache, No chest pain, No abdominal pain - No Nausea, No new weakness tingling or numbness, No Cough - SOB.     Objective:   Filed Vitals:   04/16/11 1309 04/16/11 2111 04/17/11 0250 04/17/11 0638  BP: 128/67 118/68 143/75 132/75  Pulse: 57 62 60 60  Temp: 96.8 F (36 C) 98.6 F (37 C)  97.8 F (36.6 C)  TempSrc: Oral Oral  Oral  Resp: 20 18 20 18   Height:      Weight:    87 kg (191 lb 12.8 oz)  SpO2: 93% 91%  92%    Wt Readings from Last 3 Encounters:  04/17/11 87 kg (191 lb 12.8 oz)  04/14/11 92.443 kg (203 lb 12.8 oz)  03/07/11 92.08 kg (203 lb)     Intake/Output Summary (Last 24 hours) at 04/17/11 1056 Last data filed at 04/17/11 0700  Gross per 24 hour  Intake 1796.5 ml  Output   1501 ml  Net  295.5 ml    Exam Awake Alert, Oriented *3, No new F.N deficits, Normal affect [place].AT,PERRAL Supple Neck,No JVD, No cervical lymphadenopathy appriciated.  Symmetrical Chest wall movement, Good air movement bilaterally, CTAB RRR,No Gallops,Rubs or new Murmurs, No Parasternal Heave +ve B.Sounds, Abd Soft, Non tender, No organomegaly appriciated, No rebound -guarding or rigidity, Multiple ventral incisional herniae, reducible , NG in place  No Cyanosis, Clubbing or edema, No new Rash or bruise     Data Review  CBC  Lab 04/17/11 0616 04/16/11 0525 04/15/11 0338 04/14/11 1417  WBC 3.9* 5.0 5.2 6.5  HGB 13.8 13.3 13.1 14.7  HCT 39.2 38.0* 37.9* 42.2  PLT 77* 68* 65* 77*  MCV 102.6* 102.7* 103.3* 103.2*  MCH 36.1* 35.9* 35.7* 35.9*  MCHC 35.2 35.0 34.6 34.8  RDW 16.2* 16.1* 16.3* 16.1*  LYMPHSABS -- -- -- 0.7  MONOABS -- -- -- 0.5  EOSABS -- -- -- 0.0  BASOSABS -- -- -- 0.0  BANDABS -- -- -- --     Chemistries   Lab 04/17/11 0616 04/16/11 0525 04/15/11 0338 04/14/11 1417  NA 136 137 136 137  K 3.7 3.9 3.7 4.5  CL 98 100 100 100  CO2 31 32 30 33*  GLUCOSE 116* 103* 91 154*  BUN 8 11 10 10   CREATININE 0.82 0.88 0.87 0.90  CALCIUM 9.4 9.3 9.1 9.8  MG -- -- -- --  AST 55* 42* 38* 46*  ALT 40 31 29 36  ALKPHOS 62 55 54 68  BILITOT 5.8* 5.6* 4.5* 4.9*   ------------------------------------------------------------------------------------------------------------------ estimated creatinine clearance is 82.8 ml/[name] (by C-G formula based on Cr of 0.82). ------------------------------------------------------------------------------------------------------------------ No results found for this basename: HGBA1C:2 in the last 72 hours ------------------------------------------------------------------------------------------------------------------ No results found for this basename: CHOL:2,HDL:2,LDLCALC:2,TRIG:2,CHOLHDL:2,LDLDIRECT:2 in the last 72 hours ------------------------------------------------------------------------------------------------------------------ No results found for this basename: TSH,T4TOTAL,FREET3,T3FREE,THYROIDAB in the last 72 hours ------------------------------------------------------------------------------------------------------------------ No results found for this basename: VITAMINB12:2,FOLATE:2,FERRITIN:2,TIBC:2,IRON:2,RETICCTPCT:2 in the last 72 hours  Coagulation profile  Lab 04/17/11 0616 04/16/11 0525 04/15/11 0338 04/14/11 1417  INR 1.51* 1.88* 2.59* 2.44*  PROTIME -- -- -- --    No results found for this basename: DDIMER:2 in the last 72 hours  Cardiac  Enzymes No results found for this basename: CK:3,CKMB:3,TROPONINI:3,MYOGLOBIN:3 in the last 168 hours ------------------------------------------------------------------------------------------------------------------ No components found with this basename: POCBNP:3  Micro Results Recent  Results (from the past 240 hour(s))  MRSA PCR SCREENING     Status: Normal   Collection Time   04/14/11 10:24 PM      Component Value Range Status Comment   MRSA by PCR NEGATIVE  NEGATIVE  Final     Radiology Reports Ct Abdomen Pelvis W Contrast  04/14/2011  *RADIOLOGY REPORT*  Clinical Data: Abdominal pain  CT ABDOMEN AND PELVIS WITH CONTRAST  Technique:  Multidetector CT imaging of the abdomen and pelvis was performed following the standard protocol during bolus administration of intravenous contrast.  Contrast: 169m OMNIPAQUE IOHEXOL 300 MG/ML IJ SOLN  Comparison: None.  Findings: Early or partial small bowel obstruction pattern is present.  Proximal small bowel loops are dilated.  Contrast extends through small bowel to a right abdominal hernia.  Small bowel is herniated through the defect.  Fecal like material is inspissated in the small bowel at the level of the obstruction.  See image #38. Small bowel loops traverse back into the peritoneal [name] then into an additional hernia.  There is probably a level of obstruction in both of these hernia sacs.  Scar tissue surrounds the area.  The hernia is secondary to a defect in the anterior abdominal wall likely from multiple prior surgeries.  There is a dilated bowel loop in the lower mid abdomen on image 56 likely is a result of bowel anastomosis.  Distal small bowel and ileum are decompressed.  Ascending colon protrudes through the hernia defect but there is no evidence of colonic obstruction.  Mild edema involving the ascending colon as well as in the adjacent mesentery is likely related to the dilated small bowel loops rather than primary inflammation of the colon.  Small amount of free fluid is seen anterior to small bowel loops which are dilated on image 36.  There is no free intraperitoneal gas.  Post cholecystectomy.  Small amount of free fluid around the liver. Diffuse hepatic steatosis.  Pancreas, adrenal glands are within normal limits. Tiny  hypodensity in the posterior and medial spleen on image 20 of unknown significance.  Tiny liver hypodensity on image 14 of unknown significance.  Calcified granulomata in the liver.  Chronic changes of the kidneys.  Benign-appearing cyst in the upper pole of the right kidney.  Other hypodensities are too small to characterize  Penetrating atherosclerotic ulcer in the infrarenal aorta is noted on image 45.  Maximal aortic caliber at this level is 3.2 cm.  Mild ectasia of the right and left common iliac artery with caliber is of 1.8 and 1.8 cm respectively.  Portal vein is patent.  Gastroesophageal varices are identified of unknown significance.  Bladder is minimally distended.  Prostate is within normal limits.  Right rectus femoris lipoma.  IMPRESSION: The small bowel partial or early small bowel obstruction pattern secondary to and anterior abdominal wall hernia and wall defect as described.  There is inflammation within the right side the abdomen and anterior abdomen secondary to this process.  Gastroesophageal varices of unknown significance.  Chronic changes.  Original Report Authenticated By: AJamas Lav M.D.    Scheduled Meds:    . antiseptic oral rinse  15 mL Mouth Rinse BID  . aspirin  150 mg Rectal Daily  . hydrocortisone  1 application Rectal BID  . metoprolol  2.5 mg Intravenous Q6H  . pantoprazole (  PROTONIX) IV  40 mg Intravenous Q12H  . polyethylene glycol  17 g Per NG tube Daily  . sodium chloride  3 mL Intravenous Q12H   Continuous Infusions:    . dextrose 5 % with KCl 20 mEq / L 20 mEq (04/16/11 1300)  . heparin 1,200 Units/hr (04/17/11 0800)  . DISCONTD: heparin 16 Units/kg/hr (04/16/11 0915)   PRN Meds:.albuterol, benzocaine, guaiFENesin-dextromethorphan, metoprolol, morphine, ondansetron (ZOFRAN) IV, ondansetron, phenol  Assessment & Plan   1.Abdominal pain secondary to small bowel obstruction in a patient with history of Crohn's disease - likely secondary to herniae  and adhesions from prior surgical procedures has multiple ventral incisional herniae, which are reducible - continue NG tube, he feels better and he is pain free, his abdomen is soft, he seems to have some bowel sounds at this point, had 3 BMs, NG out, start Coumadin, Surg to see may get diet advanced, likely DC in 1-2 days.     2. History of ischemic cardiomyopathy with Chronic systolic CHF EF 70% and 9628, LBBB, Patient is status post AICD placement.  -  Currently he is pain-free , compensated, no rales trace bipedal edema, he is n.p.o. due to #1 above, continue gentle D5 IV fluid, as well as observation of his lung exam and oxygen demands, daily weights and I.'s and also, IV Lasix if patient's fluid status appears to be on the positive side. [place] [facility] will be contacted for any further input patient sees Dr. [name]. PRN IV Lopressor.     3. History of mesenteric ischemia - for now Coumadin resumed, will stop Heparin once INR 2.    4. History of Crohn's disease patient gets a routine colonoscopy as outpatient for now his by mouth medications will be held due to #1 if need be GI will be consulted.Have D/W Dr Hilarie Fredrickson no further changes suggested.     5. Elevated lipase on admission unclear etiology no signs of pancreatitis on CT scan history not suggestive of acute pancreatitis - NML lipase on follow up.    6. Nonspecific spleen liver and Abdominal Aorta noted on CT-  outpatient GI followup would be recommended.     DVT Prophylaxis  Couamdin - Heparin - SCDs     Thurnell Lose M.D on 04/17/2011 at 10:56 AM  [facility] Office  [phone]

## 2011-04-17 NOTE — Progress Notes (Signed)
  Subjective: Tolerating clears, no pain, no nausea, several BM's  Objective: Vital signs in last 24 hours: Temp:  [96.8 F (36 C)-98.6 F (37 C)] 97.8 F (36.6 C) (03/17 1517) Pulse Rate:  [57-62] 60  (03/17 0638) Resp:  [18-20] 18  (03/17 6160) BP: (118-143)/(67-75) 132/75 mmHg (03/17 0638) SpO2:  [91 %-93 %] 92 % (03/17 7371) Weight:  [191 lb 12.8 oz (87 kg)] 191 lb 12.8 oz (87 kg) (03/17 0638) Last BM Date: 04/16/11  Intake/Output from previous day: 03/16 0701 - 03/17 0700 In: 1796.5 [P.O.:720; I.V.:1076.5] Out: 1801 [Urine:1800; Stool:1] Intake/Output this shift:    General appearance: alert, cooperative and no distress GI: soft, NT, ND, hernias reducible  Lab Results:   Basename 04/17/11 0616 04/16/11 0525  WBC 3.9* 5.0  HGB 13.8 13.3  HCT 39.2 38.0*  PLT 77* 68*   BMET  Basename 04/17/11 0616 04/16/11 0525  NA 136 137  K 3.7 3.9  CL 98 100  CO2 31 32  GLUCOSE 116* 103*  BUN 8 11  CREATININE 0.82 0.88  CALCIUM 9.4 9.3   PT/INR  Basename 04/17/11 0616 04/16/11 0525  LABPROT 18.5* 21.9*  INR 1.51* 1.88*   ABG No results found for this basename: PHART:2,PCO2:2,PO2:2,HCO3:2 in the last 72 hours  Studies/Results: Dg Abd Portable 1v  04/16/2011  *RADIOLOGY REPORT*  Clinical Data: Abdominal pain and discomfort  PORTABLE ABDOMEN - 1 VIEW  Comparison: Plain film 04/15/2011  Findings: NG tube is within the gastric antrum.  Oral contrast within the colon is not changed.  No dilated loops of large or small bowel.  IMPRESSION:  1.  No interval change. 2.  N G tube in stomach.  Original Report Authenticated By: Suzy Bouchard, M.D.    Anti-infectives: Anti-infectives    None      Assessment/Plan: s/p * No surgery found * He looks good.  advance diet as tolerated and anticoagulation per IM  LOS: 3 days    Madilyn Hook [name] 04/17/2011

## 2011-04-17 NOTE — Progress Notes (Signed)
ANTICOAGULATION CONSULT NOTE - Follow Up Consult  Pharmacy Consult for: IV heparin Indication: Hx mesenteric clot  No Known Allergies  Patient Measurements: Height: 6' (182.9 cm) Weight: 191 lb 12.8 oz (87 kg) IBW/kg (Calculated) : 77.6    Vital Signs: Temp: 98.5 F (36.9 C) (03/17 1300) Temp src: Oral (03/17 1300) BP: 145/74 mmHg (03/17 1300) Pulse Rate: 63  (03/17 1300)  Labs:  Basename 04/17/11 1556 04/17/11 0616 04/16/11 1759 04/16/11 0525 04/15/11 0338  HGB -- 13.8 -- 13.3 --  HCT -- 39.2 -- 38.0* 37.9*  PLT -- 77* -- 68* 65*  APTT -- -- -- -- --  LABPROT -- 18.5* -- 21.9* 28.2*  INR -- 1.51* -- 1.88* 2.59*  HEPARINUNFRC 0.59 0.82* 0.45 -- --  CREATININE -- 0.82 -- 0.88 0.87  CKTOTAL -- -- -- -- --  CKMB -- -- -- -- --  TROPONINI -- -- -- -- --   Estimated Creatinine Clearance: 82.8 ml/min (by C-G formula based on Cr of 0.82).   Medications:  Scheduled:    . antiseptic oral rinse  15 mL Mouth Rinse BID  . aspirin  150 mg Rectal Daily  . hydrocortisone  1 application Rectal BID  . metoprolol  2.5 mg Intravenous Q6H  . pantoprazole (PROTONIX) IV  40 mg Intravenous Q12H  . polyethylene glycol  17 g Per NG tube Daily  . sodium chloride  3 mL Intravenous Q12H  . warfarin  20 mg Oral ONCE-1800  . Warfarin - Pharmacist Dosing Inpatient   Does not apply q1800   Infusions:    . dextrose 5 % with KCl 20 mEq / L 20 mEq (04/16/11 1300)  . heparin 1,200 Units/hr (04/17/11 0800)  . DISCONTD: heparin 16 Units/kg/hr (04/16/11 0915)   PRN: albuterol, benzocaine, guaiFENesin-dextromethorphan, metoprolol, morphine, ondansetron (ZOFRAN) IV, ondansetron, phenol  Assessment: Heparin level therapeutic  Goal of Therapy:  Heparin level 0.3-0.7 units/ml   Plan:  Continue heparin 1200 units/hr F/u am level   [name], Gaye Alken 04/17/2011,5:03 PM

## 2011-04-18 DIAGNOSIS — K436 Other and unspecified ventral hernia with obstruction, without gangrene: Secondary | ICD-10-CM | POA: Diagnosis not present

## 2011-04-18 DIAGNOSIS — I509 Heart failure, unspecified: Secondary | ICD-10-CM

## 2011-04-18 DIAGNOSIS — Z95810 Presence of automatic (implantable) cardiac defibrillator: Secondary | ICD-10-CM | POA: Diagnosis not present

## 2011-04-18 DIAGNOSIS — I2510 Atherosclerotic heart disease of native coronary artery without angina pectoris: Secondary | ICD-10-CM | POA: Diagnosis not present

## 2011-04-18 DIAGNOSIS — I5022 Chronic systolic (congestive) heart failure: Secondary | ICD-10-CM | POA: Diagnosis not present

## 2011-04-18 DIAGNOSIS — K56609 Unspecified intestinal obstruction, unspecified as to partial versus complete obstruction: Secondary | ICD-10-CM | POA: Diagnosis not present

## 2011-04-18 LAB — PROTIME-INR
INR: 1.48 (ref 0.00–1.49)
Prothrombin Time: 18.2 seconds — ABNORMAL HIGH (ref 11.6–15.2)

## 2011-04-18 LAB — COMPREHENSIVE METABOLIC PANEL
AST: 80 U/L — ABNORMAL HIGH (ref 0–37)
Albumin: 2.9 g/dL — ABNORMAL LOW (ref 3.5–5.2)
Alkaline Phosphatase: 74 U/L (ref 39–117)
Chloride: 100 mEq/L (ref 96–112)
Creatinine, Ser: 0.9 mg/dL (ref 0.50–1.35)
Potassium: 3.5 mEq/L (ref 3.5–5.1)
Sodium: 136 mEq/L (ref 135–145)
Total Bilirubin: 4.2 mg/dL — ABNORMAL HIGH (ref 0.3–1.2)

## 2011-04-18 LAB — CBC
MCH: 36.2 pg — ABNORMAL HIGH (ref 26.0–34.0)
MCV: 101.9 fL — ABNORMAL HIGH (ref 78.0–100.0)
Platelets: 73 10*3/uL — ABNORMAL LOW (ref 150–400)
RBC: 3.59 MIL/uL — ABNORMAL LOW (ref 4.22–5.81)

## 2011-04-18 MED ORDER — WARFARIN SODIUM 10 MG PO TABS
20.0000 mg | ORAL_TABLET | Freq: Once | ORAL | Status: AC
  Administered 2011-04-18: 20 mg via ORAL
  Filled 2011-04-18: qty 2

## 2011-04-18 MED ORDER — HEPARIN (PORCINE) IN NACL 100-0.45 UNIT/ML-% IJ SOLN
1100.0000 [IU]/h | INTRAMUSCULAR | Status: DC
  Filled 2011-04-18 (×2): qty 250

## 2011-04-18 NOTE — Progress Notes (Signed)
[name] GQB:[id number]:[id number] is a 78 y.o. male,  Outpatient Primary MD for the patient is Noralee Space, MD, MD  Chief Complaint  Patient presents with  . Abdominal Pain  . Hernia    right abd        Subjective:    [name] today has, No headache, No chest pain, No abdominal pain - No Nausea, No new weakness tingling or numbness, No Cough - SOB.     Objective:   Filed Vitals:   04/17/11 1000 04/17/11 1300 04/17/11 2014 04/18/11 0534  BP: 134/72 145/74 131/74 131/74  Pulse: 58 63 62 60  Temp:  98.5 F (36.9 C) 98.2 F (36.8 C) 98.9 F (37.2 C)  TempSrc:  Oral Oral Oral  Resp:  18 18 18   Height:      Weight:    87.5 kg (192 lb 14.4 oz)  SpO2:  96% 95% 93%    Wt Readings from Last 3 Encounters:  04/18/11 87.5 kg (192 lb 14.4 oz)  04/14/11 92.443 kg (203 lb 12.8 oz)  03/07/11 92.08 kg (203 lb)     Intake/Output Summary (Last 24 hours) at 04/18/11 1214 Last data filed at 04/18/11 0600  Gross per 24 hour  Intake   1128 ml  Output   1030 ml  Net     98 ml    Exam  Awake Alert, Oriented *3, No new F.N deficits, Normal affect [place].AT,PERRAL Supple Neck,No JVD, No cervical lymphadenopathy appriciated.  Symmetrical Chest wall movement, Good air movement bilaterally, CTAB RRR,No Gallops,Rubs or new Murmurs, No Parasternal Heave +ve B.Sounds, Abd Soft, Non tender, No organomegaly appriciated, No rebound -guarding or rigidity, Multiple ventral incisional herniae, reducible , NG in place  No Cyanosis, Clubbing or edema, No new Rash or bruise       Data Review  CBC  Lab 04/18/11 0530 04/17/11 0616 04/16/11 0525 04/15/11 0338 04/14/11 1417  WBC 3.2* 3.9* 5.0 5.2 6.5  HGB 13.0 13.8 13.3 13.1 14.7  HCT 36.6* 39.2 38.0* 37.9* 42.2  PLT 73* 77* 68* 65* 77*  MCV 101.9* 102.6* 102.7* 103.3* 103.2*  MCH 36.2* 36.1* 35.9* 35.7* 35.9*  MCHC 35.5 35.2 35.0 34.6 34.8  RDW 16.2* 16.2* 16.1* 16.3* 16.1*  LYMPHSABS -- -- -- -- 0.7  MONOABS -- -- -- -- 0.5    EOSABS -- -- -- -- 0.0  BASOSABS -- -- -- -- 0.0  BANDABS -- -- -- -- --    Chemistries   Lab 04/18/11 0530 04/17/11 0616 04/16/11 0525 04/15/11 0338 04/14/11 1417  NA 136 136 137 136 137  K 3.5 3.7 3.9 3.7 4.5  CL 100 98 100 100 100  CO2 29 31 32 30 33*  GLUCOSE 107* 116* 103* 91 154*  BUN 7 8 11 10 10   CREATININE 0.90 0.82 0.88 0.87 0.90  CALCIUM 9.2 9.4 9.3 9.1 9.8  MG -- -- -- -- --  AST 80* 55* 42* 38* 46*  ALT 65* 40 31 29 36  ALKPHOS 74 62 55 54 68  BILITOT 4.2* 5.8* 5.6* 4.5* 4.9*   ------------------------------------------------------------------------------------------------------------------ estimated creatinine clearance is 75.4 ml/min (by C-G formula based on Cr of 0.9). ------------------------------------------------------------------------------------------------------------------ No results found for this basename: HGBA1C:2 in the last 72 hours ------------------------------------------------------------------------------------------------------------------ No results found for this basename: CHOL:2,HDL:2,LDLCALC:2,TRIG:2,CHOLHDL:2,LDLDIRECT:2 in the last 72 hours ------------------------------------------------------------------------------------------------------------------ No results found for this basename: TSH,T4TOTAL,FREET3,T3FREE,THYROIDAB in the last 72 hours ------------------------------------------------------------------------------------------------------------------ No results found for this basename: VITAMINB12:2,FOLATE:2,FERRITIN:2,TIBC:2,IRON:2,RETICCTPCT:2 in the last 72 hours  Coagulation profile  Lab 04/18/11 0530 04/17/11 0616 04/16/11 0525 04/15/11 0338 04/14/11 1417  INR 1.48 1.51* 1.88* 2.59* 2.44*  PROTIME -- -- -- -- --    No results found for this basename: DDIMER:2 in the last 72 hours  Cardiac Enzymes No results found for this basename: CK:3,CKMB:3,TROPONINI:3,MYOGLOBIN:3 in the last 168  hours ------------------------------------------------------------------------------------------------------------------ No components found with this basename: POCBNP:3  Micro Results Recent Results (from the past 240 hour(s))  MRSA PCR SCREENING     Status: Normal   Collection Time   04/14/11 10:24 PM      Component Value Range Status Comment   MRSA by PCR NEGATIVE  NEGATIVE  Final     Radiology Reports Ct Abdomen Pelvis W Contrast  04/14/2011  *RADIOLOGY REPORT*  Clinical Data: Abdominal pain  CT ABDOMEN AND PELVIS WITH CONTRAST  Technique:  Multidetector CT imaging of the abdomen and pelvis was performed following the standard protocol during bolus administration of intravenous contrast.  Contrast: 130m OMNIPAQUE IOHEXOL 300 MG/ML IJ SOLN  Comparison: None.  Findings: Early or partial small bowel obstruction pattern is present.  Proximal small bowel loops are dilated.  Contrast extends through small bowel to a right abdominal hernia.  Small bowel is herniated through the defect.  Fecal like material is inspissated in the small bowel at the level of the obstruction.  See image #38. Small bowel loops traverse back into the peritoneal space then into an additional hernia.  There is probably a level of obstruction in both of these hernia sacs.  Scar tissue surrounds the area.  The hernia is secondary to a defect in the anterior abdominal wall likely from multiple prior surgeries.  There is a dilated bowel loop in the lower mid abdomen on image 56 likely is a result of bowel anastomosis.  Distal small bowel and ileum are decompressed.  Ascending colon protrudes through the hernia defect but there is no evidence of colonic obstruction.  Mild edema involving the ascending colon as well as in the adjacent mesentery is likely related to the dilated small bowel loops rather than primary inflammation of the colon.  Small amount of free fluid is seen anterior to small bowel loops which are dilated on image  36.  There is no free intraperitoneal gas.  Post cholecystectomy.  Small amount of free fluid around the liver. Diffuse hepatic steatosis.  Pancreas, adrenal glands are within normal limits. Tiny hypodensity in the posterior and medial spleen on image 20 of unknown significance.  Tiny liver hypodensity on image 14 of unknown significance.  Calcified granulomata in the liver.  Chronic changes of the kidneys.  Benign-appearing cyst in the upper pole of the right kidney.  Other hypodensities are too small to characterize  Penetrating atherosclerotic ulcer in the infrarenal aorta is noted on image 45.  Maximal aortic caliber at this level is 3.2 cm.  Mild ectasia of the right and left common iliac artery with caliber is of 1.8 and 1.8 cm respectively.  Portal vein is patent.  Gastroesophageal varices are identified of unknown significance.  Bladder is minimally distended.  Prostate is within normal limits.  Right rectus femoris lipoma.  IMPRESSION: The small bowel partial or early small bowel obstruction pattern secondary to and anterior abdominal wall hernia and wall defect as described.  There is inflammation within the right side the abdomen and anterior abdomen secondary to this process.  Gastroesophageal varices of unknown significance.  Chronic changes.  Original Report Authenticated By: AJamas Lav M.D.  Scheduled Meds:    . antiseptic oral rinse  15 mL Mouth Rinse BID  . aspirin EC  81 mg Oral Daily  . carvedilol  3.125 mg Oral BID WC  . furosemide  40 mg Oral Daily  . hydrocortisone  1 application Rectal BID  . pantoprazole (PROTONIX) IV  40 mg Intravenous Q12H  . polyethylene glycol  17 g Per NG tube Daily  . simvastatin  20 mg Oral QHS  . sodium chloride  3 mL Intravenous Q12H  . warfarin  20 mg Oral ONCE-1800  . warfarin  20 mg Oral ONCE-1800  . Warfarin - Pharmacist Dosing Inpatient   Does not apply q1800  . DISCONTD: aspirin  150 mg Rectal Daily  . DISCONTD: aspirin  81 mg Oral  Daily  . DISCONTD: metoprolol  2.5 mg Intravenous Q6H   Continuous Infusions:    . DISCONTD: heparin 1,200 Units/hr (04/18/11 0600)  . DISCONTD: heparin 1,100 Units/hr (04/18/11 1000)   PRN Meds:.albuterol, benzocaine, guaiFENesin-dextromethorphan, metoprolol, morphine, ondansetron (ZOFRAN) IV, ondansetron, phenol  Assessment & Plan   1.Abdominal pain secondary to small bowel obstruction in a patient with history of Crohn's disease - likely secondary to herniae and adhesions from prior surgical procedures has multiple ventral incisional herniae, which are reducible - continue NG tube, he feels better and he is pain free, his abdomen is soft, he seems to have some bowel sounds at this point, had 3 BMs, NG out, start Coumadin, advance to full diet,     2. History of ischemic cardiomyopathy with Chronic systolic CHF EF 37% and 0488, LBBB, Patient is status post AICD placement.  -  Currently he is pain-free , compensated, no rales trace bipedal edema, he is n.p.o. due to #1 above, continue gentle D5 IV fluid, as well as observation of his lung exam and oxygen demands, daily weights and I.'s and also, IV Lasix if patient's fluid status appears to be on the positive side. [place] [facility] will be contacted for any further input patient sees Dr. [name]. PRN IV Lopressor.     3. History of mesenteric ischemia - Coumadin resumed yesterday, Hep gtt stooped as had nose bleed x 3 , bleed stopped, will let INR rise gradually.    4. History of Crohn's disease patient gets a routine colonoscopy as outpatient for now his by mouth medications will be held due to #1 if need be GI will be consulted.Have D/W Dr Hilarie Fredrickson no further changes suggested.     5. Elevated lipase on admission unclear etiology no signs of pancreatitis on CT scan history not suggestive of acute pancreatitis - NML lipase on follow up.    6. Nonspecific spleen liver and Abdominal Aorta noted on CT-  outpatient GI followup would be  recommended.     DVT Prophylaxis  Couamdin - Heparin - SCDs     Thurnell Lose M.D on 04/18/2011 at 12:14 PM  [facility] Office  [phone]

## 2011-04-18 NOTE — Progress Notes (Signed)
ANTICOAGULATION CONSULT NOTE - Follow Up Consult  Pharmacy Consult for: IV heparin Indication: Hx mesenteric clot  No Known Allergies  Patient Measurements: Height: 6' (182.9 cm) Weight: 192 lb 14.4 oz (87.5 kg) IBW/kg (Calculated) : 77.6    Vital Signs: Temp: 98.9 F (37.2 C) (03/18 0534) Temp src: Oral (03/18 0534) BP: 131/74 mmHg (03/18 0534) Pulse Rate: 60  (03/18 0534)  Labs:  Basename 04/18/11 0530 04/17/11 1556 04/17/11 0616 04/16/11 0525  HGB 13.0 -- 13.8 --  HCT 36.6* -- 39.2 38.0*  PLT 73* -- 77* 68*  APTT -- -- -- --  LABPROT 18.2* -- 18.5* 21.9*  INR 1.48 -- 1.51* 1.88*  HEPARINUNFRC 0.71* 0.59 0.82* --  CREATININE 0.90 -- 0.82 0.88  CKTOTAL -- -- -- --  CKMB -- -- -- --  TROPONINI -- -- -- --   Estimated Creatinine Clearance: 75.4 ml/min (by C-G formula based on Cr of 0.9).   Medications:  Scheduled:     . antiseptic oral rinse  15 mL Mouth Rinse BID  . aspirin EC  81 mg Oral Daily  . carvedilol  3.125 mg Oral BID WC  . furosemide  40 mg Oral Daily  . hydrocortisone  1 application Rectal BID  . pantoprazole (PROTONIX) IV  40 mg Intravenous Q12H  . polyethylene glycol  17 g Per NG tube Daily  . simvastatin  20 mg Oral QHS  . sodium chloride  3 mL Intravenous Q12H  . warfarin  20 mg Oral ONCE-1800  . Warfarin - Pharmacist Dosing Inpatient   Does not apply q1800  . DISCONTD: aspirin  150 mg Rectal Daily  . DISCONTD: aspirin  81 mg Oral Daily  . DISCONTD: metoprolol  2.5 mg Intravenous Q6H   Infusions:     . heparin 1,200 Units/hr (04/18/11 0600)   PRN: albuterol, benzocaine, guaiFENesin-dextromethorphan, metoprolol, morphine, ondansetron (ZOFRAN) IV, ondansetron, phenol Inpatient warfarin doses:  3/17 39m  Assessment:  78yo M admit with partial SBO.  Chronic warfarin held on admission in case surgical intervention was needed;  warfarin resumed 3/17.  Heparin level slightly supratherapeutic at 0.71  INR subtherapeutic at 1.48  CBC is  stable, Plt are still low.  Pt c/o recurrent nosebleeds after sneezing.  Per  RN, no major blood loss.  Consider antibiotic ointment or saline nasal spray for moisturizing.  No other bleeding or complications noted.  Goal of Therapy:  Heparin level 0.3-0.7 units/ml   Plan:   Decrease heparin IV infusion to 1100 Units/hr  Heparin level in 8 hours  D/C heparin when INR >2  Warfarin 228mPO x1 tonight  Daily heparin level and INR    ChGretta ArabharmD, BCPS Pager [phone] 2379/18/2013 9:48 AM

## 2011-04-18 NOTE — Progress Notes (Signed)
  Subjective: Tolerating full liquids well. Had 3 BM's this am.  Semi- formed stools.  Objective: Vital signs in last 24 hours: Temp:  [98.2 F (36.8 C)-98.9 F (37.2 C)] 98.9 F (37.2 C) (03/18 0534) Pulse Rate:  [60-63] 60  (03/18 0534) Resp:  [18] 18  (03/18 0534) BP: (131-145)/(74) 131/74 mmHg (03/18 0534) SpO2:  [93 %-96 %] 93 % (03/18 0534) Weight:  [87.5 kg (192 lb 14.4 oz)] 87.5 kg (192 lb 14.4 oz) (03/18 0534) Last BM Date: 04/17/11  Intake/Output from previous day: 03/17 0701 - 03/18 0700 In: 7342 [P.O.:1320; I.V.:278; IV Piggyback:10] Out: 1030 [Urine:1030] Intake/Output this shift:    General appearance: alert, cooperative and no distress Resp: clear to auscultation bilaterally Cardio: regular rate and rhythm GI: soft, non-tender; bowel sounds normal; multiple old hernias which are all soft and easily reducible.  Lab Results:   Basename 04/18/11 0530 04/17/11 0616  WBC 3.2* 3.9*  HGB 13.0 13.8  HCT 36.6* 39.2  PLT 73* 77*   BMET  Basename 04/18/11 0530 04/17/11 0616  NA 136 136  K 3.5 3.7  CL 100 98  CO2 29 31  GLUCOSE 107* 116*  BUN 7 8  CREATININE 0.90 0.82  CALCIUM 9.2 9.4   PT/INR  Basename 04/18/11 0530 04/17/11 0616  LABPROT 18.2* 18.5*  INR 1.48 1.51*   ABG No results found for this basename: PHART:2,PCO2:2,PO2:2,HCO3:2 in the last 72 hours  Studies/Results: No results found.  Anti-infectives: Anti-infectives    None      Assessment/Plan: s/p * No surgery found * SBO- resolved and doing well- Will lAdvance diet Anticoagulation- Per Primary service  LOS: 4 days    Abdikadir Fohl,PA-C Pager [phone] General Trauma Pager [phone]

## 2011-04-18 NOTE — Progress Notes (Addendum)
Subjective:   Mr. 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  He has done well over the weekend.  Eating liquids.       [name] antiseptic oral rinse  15 mL Mouth Rinse BID  . aspirin EC  81 mg Oral Daily  . carvedilol  3.125 mg Oral BID WC  . furosemide  40 mg Oral Daily  . hydrocortisone  1 application Rectal BID  . pantoprazole (PROTONIX) IV  40 mg Intravenous Q12H  . polyethylene glycol  17 g Per NG tube Daily  . simvastatin  20 mg Oral QHS  . sodium chloride  3 mL Intravenous Q12H  . warfarin  20 mg Oral ONCE-1800  . Warfarin - Pharmacist Dosing Inpatient   Does not apply q1800  . DISCONTD: aspirin  150 mg Rectal Daily  . DISCONTD: aspirin  81 mg Oral Daily  . DISCONTD: metoprolol  2.5 mg Intravenous Q6H      . heparin 1,200 Units/hr (04/17/11 2337)    Objective:  Vital Signs in the last 24 hours: Blood pressure 131/74, pulse 60, temperature 98.9 F (37.2 C), temperature source Oral, resp. rate 18, height 6' (1.829 m), weight 192 lb 14.4 oz (87.5 kg), SpO2 93.00%. Temp:  [98.2 F (36.8 C)-98.9 F (37.2 C)] 98.9 F (37.2 C) (03/18 0534) Pulse Rate:  [58-63] 60  (03/18 0534) Resp:  [18] 18  (03/18 0534) BP: (131-145)/(72-74) 131/74  mmHg (03/18 0534) SpO2:  [93 %-96 %] 93 % (03/18 0534) Weight:  [192 lb 14.4 oz (87.5 kg)] 192 lb 14.4 oz (87.5 kg) (03/18 0534)  Intake/Output from previous day: 03/17 0701 - 03/18 0700 In: 1411.4 [P.O.:1200; I.V.:201.4; IV Piggyback:10] Out: 1030 [Urine:1030] Intake/Output from this shift:    Physical Exam:  Physical Exam: Blood pressure 131/74, pulse 60, temperature 98.9 F (37.2 C), temperature source Oral, resp. rate 18, height 6' (1.829 m), weight 192 lb 14.4 oz (87.5 kg), SpO2 93.00%. General: Well developed, well nourished, in no acute distress. Head: Normocephalic, atraumatic, sclera non-icteric, mucus membranes are moist,  Neck: Supple. Normal carotids. No JVD Lungs: Clear bilaterally to auscultation without wheezes, rales, or rhonchi. Breathing is unlabored. Heart: Regular rate,   Abdomen: Soft, non-tender, non-distended with normoactive bowel sounds. No hepatomegaly. No rebound/guarding. No abdominal masses. Msk:  Strength and tone appear normal for age. Extremities: No clubbing or cyanosis. No edema.  Distal pedal pulses are 2+ and equal bilaterally. Neuro: Alert and oriented X 3. Moves all extremities spontaneously. Psych:  Responds to questions appropriately with a normal affect.    Lab Results:   Basename 04/18/11 0530 04/17/11 0616  NA 136 136  K 3.5 3.7  CL 100 98  CO2 29 31  GLUCOSE 107* 116*  BUN 7 8  CREATININE 0.90 0.82  CALCIUM 9.2 9.4  MG -- --  PHOS -- --  Basename 04/18/11 0530 04/17/11 0616  AST 80* 55*  ALT 65* 40  ALKPHOS 74 62  BILITOT 4.2* 5.8*  PROT 5.2* 5.6*  ALBUMIN 2.9* 3.0*   No results found for this basename: LIPASE:2,AMYLASE:2 in the last 72 hours  Basename 04/18/11 0530 04/17/11 0616  WBC 3.2* 3.9*  NEUTROABS -- --  HGB 13.0 13.8  HCT 36.6* 39.2  MCV 101.9* 102.6*  PLT 73* 77*    Tele:  V paced  Assessment/Plan:   1. CAD : no angina.  It does not appear that he will need abdominal surgery.    2.CHF:   Stable.    3. Crohns disease:  I suggested that he ask his doctor about Remicaid or Humira in addition to the 6-MP.  He still has occasional flare ups.  4.  Mesenteric thrombosis:  Restart coumadin as soon as it's assured that he will not be needing surgery.  Will sign  Off.  Call for questions.  Thayer Headings, Brooke Bonito., MD, [facility] 04/18/2011, 7:40 AM LOS: Day 4

## 2011-04-19 ENCOUNTER — Telehealth: Payer: Self-pay | Admitting: Pulmonary Disease

## 2011-04-19 ENCOUNTER — Telehealth: Payer: Self-pay | Admitting: Cardiology

## 2011-04-19 DIAGNOSIS — I2510 Atherosclerotic heart disease of native coronary artery without angina pectoris: Secondary | ICD-10-CM | POA: Diagnosis not present

## 2011-04-19 DIAGNOSIS — K56609 Unspecified intestinal obstruction, unspecified as to partial versus complete obstruction: Secondary | ICD-10-CM | POA: Diagnosis not present

## 2011-04-19 DIAGNOSIS — Z95810 Presence of automatic (implantable) cardiac defibrillator: Secondary | ICD-10-CM | POA: Diagnosis not present

## 2011-04-19 DIAGNOSIS — K436 Other and unspecified ventral hernia with obstruction, without gangrene: Secondary | ICD-10-CM | POA: Diagnosis not present

## 2011-04-19 DIAGNOSIS — I5022 Chronic systolic (congestive) heart failure: Secondary | ICD-10-CM | POA: Diagnosis not present

## 2011-04-19 LAB — HEMOGLOBIN AND HEMATOCRIT, BLOOD: Hemoglobin: 12.6 g/dL — ABNORMAL LOW (ref 13.0–17.0)

## 2011-04-19 MED ORDER — WARFARIN SODIUM 7.5 MG PO TABS
15.0000 mg | ORAL_TABLET | Freq: Once | ORAL | Status: AC
  Administered 2011-04-19: 15 mg via ORAL
  Filled 2011-04-19: qty 2

## 2011-04-19 NOTE — Evaluation (Signed)
Physical Therapy Evaluation Patient Details Name: [name] MRN: [id number] DOB: Nov 14, 1931 Today's Date: 04/19/2011  Problem List:  Patient Active Problem List  Diagnoses  . SHINGLES  . COLONIC POLYPS  . VITAMIN B12 DEFICIENCY  . VITAMIN D DEFICIENCY  . OTHER FLUID OVERLOAD  . ANXIETY  . CARDIOMYOPATHY, ISCHEMIC  s/p CABG    . LBBB  . BRONCHITIS, ACUTE  . SINUSITIS  . GERD  . VENTRAL HERNIA  . ACUTE VASCULAR INSUFFICIENCY OF INTESTINE  . HYPERTROPHY PROSTATE W/UR OBST & OTH LUTS  . DEGENERATIVE JOINT DISEASE  . POLYMYALGIA RHEUMATICA  . FATIGUE  . TREMOR  . IMPLANTABLE DEFIBRILLATOR, GDT CONTAK H170-MADIT-CRT  . Mesenteric thrombosis  . CAD (coronary artery disease)  . Ischemic cardiomyopathy  . Hx of CABG  . Ejection fraction  . Dizziness  . Hypertension  . CHF (congestive heart failure)  . Mitral regurgitation  . Sinus bradycardia  . Chronotropic incompetence  . Biventricular implantable cardiac defibrillator in situ  . Hyperlipemia  . Crohn's disease  . Ventral hernia  . Tremor  . Warfarin anticoagulation  . Carotid artery disease  . Abdominal pain  . SBO (small bowel obstruction)    Past Medical History:  Past Medical History  Diagnosis Date  . Dizziness     Positional dizziness  . Hypertension   . Ischemic cardiomyopathy     CABG 1994 CAD catherization 1996.[name] occluded vein graft to the diagnol, other grafts patent/  catherization 2006, no PCI/ nuclear.[name] october 2011.[name] extensive scar anteroseptal and apical.. no ischemia    . CHF (congestive heart failure)     chronic systolic  . Mitral regurgitation     mild.[name] echo november 2011  EF 35%...echo.. november 2009/ ef 35%.[name] echo november 2011  . Sinus bradycardia   . Chronotropic incompetence   . Biventricular implantable cardiac defibrillator in situ     GDT CONTAK H170-MADIT-CRT-EXPLANTED 2011 implanted defibrillator- Guidant cognis, model n119-2001 Dr. Caryl Comes (11/28/2009)  . LBBB (left bundle  branch block)   . Hyperlipemia   . GERD (gastroesophageal reflux disease)   . Colonic polyp   . Crohn's disease   . Acute vascular insufficiency of intestine     Mesenteric embolus...surgery 2003  . Ventral hernia   . Hypertrophy of prostate with urinary obstruction and other lower urinary tract symptoms (LUTS)   . Degenerative joint disease   . Polymyalgia rheumatica   . Tremor   . Anxiety   . Shingles   . Vitamin B12 deficiency   . CAD (coronary artery disease)     CABG 1994  /   catheterization 1996, occluded vein graft to the diagonal, other grafts patent  /   catheterization 2006, no PCI  /  nuclear, October, 2011, extensive scar anteroseptal and apical, no ischemia  . Hx of CABG     1994  . Ejection fraction     EF 35%, echo, November, 2011  . Warfarin anticoagulation     Coumadin therapy for mesenteric embolus 2003  . Carotid artery disease     Doppler, February,  2012, 0-39% bilateral, recommended followup 1 year   Past Surgical History:  Past Surgical History  Procedure Date  . Coronary artery bypass graft 1994  . Laparoscopic cholecystectomy 2002  . Elap w/ superior mesenteric artery embolectomy 1/03    by Dr. Jennette Banker  . Repair of incarcerated ventral hernia and lysis of adhesions 11/03    by Dr. Betsy Pries  . Implantation of biventric cardioverter-defibrillatorr  by Dr. Lovena Le in 8/06 Guidant Contak H170-explanted 2011  . Implantation of guidant cognis device     model [id number]  . Left inguinal hernia repair with mesh 6/08    by Dr. Betsy Pries    PT Assessment/Plan/Recommendation PT Assessment Clinical Impression Statement: Pt presents with small bowel obstruction with history of Crohn's disease.  Pt tolerated ambulation well with little assist (guarding for safety).  Pt's vitals remained stable troughout ambulation.  Pt very motivated to get back home and remain active.  Pt will not require any follow up therapy in hospital or at home upon D/C.  PT  Recommendation/Assessment: Patent does not need any further PT services No Skilled PT: All education completed;Patient is supervision for all activity/mobility;Patient will have necessary level of assist by caregiver at discharge PT Recommendation Follow Up Recommendations: No PT follow up Equipment Recommended: None recommended by PT PT Goals     PT Evaluation Precautions/Restrictions  Precautions Required Braces or Orthoses: No Restrictions Weight Bearing Restrictions: No Prior Functioning  Home Living Lives With: Spouse Receives Help From: Family Home Layout: Two level;Able to live on main level with bedroom/bathroom Alternate Level Stairs-Number of Steps: 12 Home Access: Level entry Bathroom Shower/Tub: Walk-in shower;Tub/shower unit Home Adaptive Equipment: None Prior Function Level of Independence: Independent with basic ADLs;Independent with gait;Independent with transfers Driving: Yes Cognition Cognition Arousal/Alertness: Awake/alert Overall Cognitive Status: Appears within functional limits for tasks assessed Sensation/Coordination Sensation Light Touch: Appears Intact Coordination Gross Motor Movements are Fluid and Coordinated: Yes Extremity Assessment RLE Assessment RLE Assessment: Within Functional Limits LLE Assessment LLE Assessment: Within Functional Limits Mobility (including Balance) Bed Mobility Bed Mobility: No (Pt already in recliner) Transfers Transfers: Yes Sit to Stand: 4: Min assist;With upper extremity assist;With armrests;From chair/3-in-1 Sit to Stand Details (indicate cue type and reason): Min/guard for safety.  Pt demos good UE technique.  Stand to Sit: 5: Supervision;With upper extremity assist;With armrests;To chair/3-in-1 Stand to Sit Details: Supervision for safety.   Pt demos good UE technqiue.  Ambulation/Gait Ambulation/Gait: Yes Ambulation/Gait Assistance: 4: Min assist Ambulation/Gait Assistance Details (indicate cue type and  reason): Min/guard for safety.  Ambulation Distance (Feet): 250 Feet Assistive device: None Gait Pattern: Within Functional Limits Gait velocity: WFL Stairs: No Wheelchair Mobility Wheelchair Mobility: No    Exercise    End of Session PT - End of Session Equipment Utilized During Treatment: Gait belt Activity Tolerance: Patient tolerated treatment well Patient left: in chair;with call bell in reach;with family/visitor present Nurse Communication: Mobility status for transfers;Mobility status for ambulation General Behavior During Session: [facility] for tasks performed Cognition: [facility] for tasks performed  Page, Betha Loa 04/19/2011, 10:58 AM

## 2011-04-19 NOTE — Progress Notes (Signed)
Patient ID: [name], male   DOB: 06/10/1931, 78 y.o.   MRN: [id number] [facility] Surgery Progress Note:   * No surgery found *  Subjective: Mental status is clear.  Feeling better and wanting to go home.   Objective: Vital signs in last 24 hours: Temp:  [97.5 F (36.4 C)-98.6 F (37 C)] 98.6 F (37 C) (03/19 0451) Pulse Rate:  [58-60] 60  (03/19 0451) Resp:  [18-24] 24  (03/19 0451) BP: (114-133)/(64-75) 114/64 mmHg (03/19 0451) SpO2:  [93 %-97 %] 97 % (03/19 0451) Weight:  [192 lb 9.6 oz (87.363 kg)] 192 lb 9.6 oz (87.363 kg) (03/19 0451)  Intake/Output from previous day: 03/18 0701 - 03/19 0700 In: 903.9 [P.O.:840; I.V.:53.9; IV Piggyback:10] Out: -  Intake/Output this shift:    Physical Exam: Work of breathing is  Normal.  Nontender abdomen.  Better  Lab Results:  Results for orders placed during the hospital encounter of 04/14/11 (from the past 48 hour(s))  HEPARIN LEVEL (UNFRACTIONATED)     Status: Normal   Collection Time   04/17/11  3:56 PM      Component Value Range Comment   Heparin Unfractionated 0.59  0.30 - 0.70 (IU/mL)   PROTIME-INR     Status: Abnormal   Collection Time   04/18/11  5:30 AM      Component Value Range Comment   Prothrombin Time 18.2 (*) 11.6 - 15.2 (seconds)    INR 1.48  0.00 - 1.49    COMPREHENSIVE METABOLIC PANEL     Status: Abnormal   Collection Time   04/18/11  5:30 AM      Component Value Range Comment   Sodium 136  135 - 145 (mEq/L)    Potassium 3.5  3.5 - 5.1 (mEq/L)    Chloride 100  96 - 112 (mEq/L)    CO2 29  19 - 32 (mEq/L)    Glucose, Bld 107 (*) 70 - 99 (mg/dL)    BUN 7  6 - 23 (mg/dL)    Creatinine, Ser 0.90  0.50 - 1.35 (mg/dL)    Calcium 9.2  8.4 - 10.5 (mg/dL)    Total Protein 5.2 (*) 6.0 - 8.3 (g/dL)    Albumin 2.9 (*) 3.5 - 5.2 (g/dL)    AST 80 (*) 0 - 37 (U/L)    ALT 65 (*) 0 - 53 (U/L)    Alkaline Phosphatase 74  39 - 117 (U/L)    Total Bilirubin 4.2 (*) 0.3 - 1.2 (mg/dL)    GFR calc non Af Amer 80 (*)  >90 (mL/min)    GFR calc Af Amer >90  >90 (mL/min)   CBC     Status: Abnormal   Collection Time   04/18/11  5:30 AM      Component Value Range Comment   WBC 3.2 (*) 4.0 - 10.5 (K/uL)    RBC 3.59 (*) 4.22 - 5.81 (MIL/uL)    Hemoglobin 13.0  13.0 - 17.0 (g/dL)    HCT 36.6 (*) 39.0 - 52.0 (%)    MCV 101.9 (*) 78.0 - 100.0 (fL)    MCH 36.2 (*) 26.0 - 34.0 (pg)    MCHC 35.5  30.0 - 36.0 (g/dL)    RDW 16.2 (*) 11.5 - 15.5 (%)    Platelets 73 (*) 150 - 400 (K/uL)   HEPARIN LEVEL (UNFRACTIONATED)     Status: Abnormal   Collection Time   04/18/11  5:30 AM      Component Value Range  Comment   Heparin Unfractionated 0.71 (*) 0.30 - 0.70 (IU/mL)   PROTIME-INR     Status: Abnormal   Collection Time   04/19/11  5:09 AM      Component Value Range Comment   Prothrombin Time 22.0 (*) 11.6 - 15.2 (seconds)    INR 1.89 (*) 0.00 - 1.49    HEMOGLOBIN AND HEMATOCRIT, BLOOD     Status: Abnormal   Collection Time   04/19/11  5:09 AM      Component Value Range Comment   Hemoglobin 12.6 (*) 13.0 - 17.0 (g/dL)    HCT 35.4 (*) 39.0 - 52.0 (%)     Radiology/Results: No results found.  Anti-infectives: Anti-infectives    None      Assessment/Plan: Problem List: Patient Active Problem List  Diagnoses  . SHINGLES  . COLONIC POLYPS  . VITAMIN B12 DEFICIENCY  . VITAMIN D DEFICIENCY  . OTHER FLUID OVERLOAD  . ANXIETY  . CARDIOMYOPATHY, ISCHEMIC  s/p CABG    . LBBB  . BRONCHITIS, ACUTE  . SINUSITIS  . GERD  . VENTRAL HERNIA  . ACUTE VASCULAR INSUFFICIENCY OF INTESTINE  . HYPERTROPHY PROSTATE W/UR OBST & OTH LUTS  . DEGENERATIVE JOINT DISEASE  . POLYMYALGIA RHEUMATICA  . FATIGUE  . TREMOR  . IMPLANTABLE DEFIBRILLATOR, GDT CONTAK H170-MADIT-CRT  . Mesenteric thrombosis  . CAD (coronary artery disease)  . Ischemic cardiomyopathy  . Hx of CABG  . Ejection fraction  . Dizziness  . Hypertension  . CHF (congestive heart failure)  . Mitral regurgitation  . Sinus bradycardia  .  Chronotropic incompetence  . Biventricular implantable cardiac defibrillator in situ  . Hyperlipemia  . Crohn's disease  . Ventral hernia  . Tremor  . Warfarin anticoagulation  . Carotid artery disease  . Abdominal pain  . SBO (small bowel obstruction)    Improved.  Hopeful discharge tomorrow.   * No surgery found *    LOS: 5 days   Matt B. Hassell Done, MD, [facility] [phone] beeper [phone]  04/19/2011 9:29 AM

## 2011-04-19 NOTE — Telephone Encounter (Signed)
[name] has an appt with Richardson Dopp on Thursday and wants to know if his coumadin appt can be changed from Friday to Thursday.  He was just discharged from the hospital and would prefer not to have to come to the clinic two days in a row.  Please call and let them know.

## 2011-04-19 NOTE — Telephone Encounter (Signed)
Please return call to patient wife Kennyth Lose [phone]   Patient was told to f/u with Dr. [name] for bowel obstruction following discharge.  Please return call to patient wife Kennyth Lose at [phone] as she does not want to accept next available 05/03/11.

## 2011-04-19 NOTE — Telephone Encounter (Signed)
Pt was just discharged from the hospital and was told to f/u with Dr [name] in 3 days.  He is calling for an appt.  I explained that Dr [name] is not in the office this week.  Appt was scheduled with Richardson Dopp PA-C for Thursday, 04/21/11.

## 2011-04-19 NOTE — Discharge Summary (Signed)
[name], 78 y.o., DOB 11-Mar-1931, MRN [id number]. Admission date: 04/14/2011 Discharge Date 04/19/2011 Primary MD Noralee Space, MD, MD Admitting Physician Thurnell Lose, MD  Admission Diagnosis  Small bowel obstruction [560.9] ABDOMINAL PAIN; HERNIA  Discharge Diagnosis   Principal Problem:  *SBO (small bowel obstruction) Active Problems:  VITAMIN B12 DEFICIENCY  CARDIOMYOPATHY, ISCHEMIC  s/p CABG    LBBB  Mesenteric thrombosis  CAD (coronary artery disease)  Ischemic cardiomyopathy  Ejection fraction  CHF (congestive heart failure)  Biventricular implantable cardiac defibrillator in situ  Crohn's disease  Warfarin anticoagulation  Abdominal pain     Past Medical History  Diagnosis Date  . Dizziness     Positional dizziness  . Hypertension   . Ischemic cardiomyopathy     CABG 1994 CAD catherization 1996.[name] occluded vein graft to the diagnol, other grafts patent/  catherization 2006, no PCI/ nuclear.[name] october 2011.[name] extensive scar anteroseptal and apical.. no ischemia    . CHF (congestive heart failure)     chronic systolic  . Mitral regurgitation     mild.[name] echo november 2011  EF 35%...echo.. november 2009/ ef 35%.[name] echo november 2011  . Sinus bradycardia   . Chronotropic incompetence   . Biventricular implantable cardiac defibrillator in situ     GDT CONTAK H170-MADIT-CRT-EXPLANTED 2011 implanted defibrillator- Guidant cognis, model n119-2001 Dr. Caryl Comes (11/28/2009)  . LBBB (left bundle branch block)   . Hyperlipemia   . GERD (gastroesophageal reflux disease)   . Colonic polyp   . Crohn's disease   . Acute vascular insufficiency of intestine     Mesenteric embolus...surgery 2003  . Ventral hernia   . Hypertrophy of prostate with urinary obstruction and other lower urinary tract symptoms (LUTS)   . Degenerative joint disease   . Polymyalgia rheumatica   . Tremor   . Anxiety   . Shingles   . Vitamin B12 deficiency   . CAD (coronary artery disease)     CABG  1994  /   catheterization 1996, occluded vein graft to the diagonal, other grafts patent  /   catheterization 2006, no PCI  /  nuclear, October, 2011, extensive scar anteroseptal and apical, no ischemia  . Hx of CABG     1994  . Ejection fraction     EF 35%, echo, November, 2011  . Warfarin anticoagulation     Coumadin therapy for mesenteric embolus 2003  . Carotid artery disease     Doppler, February,  2012, 0-39% bilateral, recommended followup 1 year    Past Surgical History  Procedure Date  . Coronary artery bypass graft 1994  . Laparoscopic cholecystectomy 2002  . Elap w/ superior mesenteric artery embolectomy 1/03    by Dr. Jennette Banker  . Repair of incarcerated ventral hernia and lysis of adhesions 11/03    by Dr. Betsy Pries  . Implantation of biventric cardioverter-defibrillatorr     by Dr. Lovena Le in 8/06 Guidant Frederica 2011  . Implantation of guidant cognis device     model [id number]  . Left inguinal hernia repair with mesh 6/08    by Dr. Dickie La Course See H&P, Labs, Consult and Test reports for all details in brief, patient was admitted for bowel obstruction due to initially obstructed ventral hernia which was reduced in the ER, status post NG tube placement for bowel decompression, patient now completely at baseline with soft reducible multiple ventral herniae his belly, tolerating regular diet, no nausea vomiting, multiple bowel movements over the last  2 days. Initially it looked like patient might need surgery for incarcerated hernia so his Coumadin was stopped and he was placed on heparin drip however once his clinical situation improved his Coumadin was resumed.  Nonspecific spleen liver and Abdominal Aorta noted on CT- outpatient GI followup would be recommended.   History of ischemic cardiomyopathy with Chronic systolic CHF EF 28% and 3151, LBBB, Patient is status post AICD placement. - Currently he is pain-free , compensated, no rales trace ,  will be discharged on home medications with close followup with his cardiologist Dr. [name].    History of mesenteric ischemia - Coumadin resumed , Hep gtt stooped 24 hours ago as had nose bleed x 3 on the night of 04/17/2011 , bleeding has stopped, will let INR rise gradually. Last INR 1.9 with no resumption of bleeding in the last 24 hours.   History of Crohn's disease - case was discussed with Dr. Elmo Putt no acute issues resume home medications with outpatient followup with his gastroenterologist.    Consults Surgery,Cardiology  Significant Tests:  See full reports for all details     Ct Abdomen Pelvis W Contrast  04/14/2011  *RADIOLOGY REPORT*  Clinical Data: Abdominal pain  CT ABDOMEN AND PELVIS WITH CONTRAST  Technique:  Multidetector CT imaging of the abdomen and pelvis was performed following the standard protocol during bolus administration of intravenous contrast.  Contrast: 152m OMNIPAQUE IOHEXOL 300 MG/ML IJ SOLN  Comparison: None.  Findings: Early or partial small bowel obstruction pattern is present.  Proximal small bowel loops are dilated.  Contrast extends through small bowel to a right abdominal hernia.  Small bowel is herniated through the defect.  Fecal like material is inspissated in the small bowel at the level of the obstruction.  See image #38. Small bowel loops traverse back into the peritoneal space then into an additional hernia.  There is probably a level of obstruction in both of these hernia sacs.  Scar tissue surrounds the area.  The hernia is secondary to a defect in the anterior abdominal wall likely from multiple prior surgeries.  There is a dilated bowel loop in the lower mid abdomen on image 56 likely is a result of bowel anastomosis.  Distal small bowel and ileum are decompressed.  Ascending colon protrudes through the hernia defect but there is no evidence of colonic obstruction.  Mild edema involving the ascending colon as well as in the adjacent mesentery is likely  related to the dilated small bowel loops rather than primary inflammation of the colon.  Small amount of free fluid is seen anterior to small bowel loops which are dilated on image 36.  There is no free intraperitoneal gas.  Post cholecystectomy.  Small amount of free fluid around the liver. Diffuse hepatic steatosis.  Pancreas, adrenal glands are within normal limits. Tiny hypodensity in the posterior and medial spleen on image 20 of unknown significance.  Tiny liver hypodensity on image 14 of unknown significance.  Calcified granulomata in the liver.  Chronic changes of the kidneys.  Benign-appearing cyst in the upper pole of the right kidney.  Other hypodensities are too small to characterize  Penetrating atherosclerotic ulcer in the infrarenal aorta is noted on image 45.  Maximal aortic caliber at this level is 3.2 cm.  Mild ectasia of the right and left common iliac artery with caliber is of 1.8 and 1.8 cm respectively.  Portal vein is patent.  Gastroesophageal varices are identified of unknown significance.  Bladder is minimally distended.  Prostate is within normal limits.  Right rectus femoris lipoma.  IMPRESSION: The small bowel partial or early small bowel obstruction pattern secondary to and anterior abdominal wall hernia and wall defect as described.  There is inflammation within the right side the abdomen and anterior abdomen secondary to this process.  Gastroesophageal varices of unknown significance.  Chronic changes.  Original Report Authenticated By: Jamas Lav, M.D.   Dg Abd Portable 1v  04/16/2011  *RADIOLOGY REPORT*  Clinical Data: Abdominal pain and discomfort  PORTABLE ABDOMEN - 1 VIEW  Comparison: Plain film 04/15/2011  Findings: NG tube is within the gastric antrum.  Oral contrast within the colon is not changed.  No dilated loops of large or small bowel.  IMPRESSION:  1.  No interval change. 2.  N G tube in stomach.  Original Report Authenticated By: Suzy Bouchard, M.D.   Dg Abd  Portable 1v  04/15/2011  *RADIOLOGY REPORT*  Clinical Data: Mild distension, pain, nausea.  Small bowel obstruction.  PORTABLE ABDOMEN - 1 VIEW  Comparison: CT abdomen pelvis dated 04/14/2011.  Findings: Enteric tube terminates in the proximal duodenum.  No dilated loops of small bowel are seen.  This appearance is improved from the CT.  Contrast has passed into the colon, precluding complete obstruction.  Sigmoid diverticulosis.  Degenerative changes of the visualized thoracolumbar spine.  ICD leads.  Surgical clips in the upper abdomen.  IMPRESSION: Enteric tube terminates in the proximal duodenum.  Improving/improved partial small bowel obstruction.  Contrast has passed into the colon.  Original Report Authenticated By: Julian Hy, M.D.     Today   Subjective:   [name] today has no headache,no chest abdominal pain,no new weakness tingling or numbness, feels much better wants to go home today.    Objective:   Blood pressure 114/64, pulse 60, temperature 98.6 F (37 C), temperature source Oral, resp. rate 24, height 6' (1.829 m), weight 87.363 kg (192 lb 9.6 oz), SpO2 97.00%.  Intake/Output Summary (Last 24 hours) at 04/19/11 1401 Last data filed at 04/19/11 1153  Gross per 24 hour  Intake   1120 ml  Output      0 ml  Net   1120 ml    Exam Awake Alert, Oriented *3, No new F.N deficits, Normal affect [place].AT,PERRAL Supple Neck,No JVD, No cervical lymphadenopathy appriciated.  Symmetrical Chest wall movement, Good air movement bilaterally, CTAB RRR,No Gallops,Rubs or new Murmurs, No Parasternal Heave +ve B.Sounds, Abd Soft, Non tender, No organomegaly appriciated, No rebound -guarding or rigidity. Multiple old Ventral Herniae are soft & reducible. No Cyanosis, Clubbing or edema, No new Rash or bruise  Data Review     Lab Results  Component Value Date   INR 1.89* 04/19/2011   INR 1.48 04/18/2011   INR 1.51* 04/17/2011   PROTIME 17.1 07/01/2008     CBC w Diff: Lab  Results  Component Value Date   WBC 3.2* 04/18/2011   HGB 12.6* 04/19/2011   HCT 35.4* 04/19/2011   PLT 73* 04/18/2011   LYMPHOPCT 11* 04/14/2011   MONOPCT 8 04/14/2011   EOSPCT 1 04/14/2011   BASOPCT 0 04/14/2011   CMP: Lab Results  Component Value Date   NA 136 04/18/2011   K 3.5 04/18/2011   CL 100 04/18/2011   CO2 29 04/18/2011   BUN 7 04/18/2011   CREATININE 0.90 04/18/2011   PROT 5.2* 04/18/2011   ALBUMIN 2.9* 04/18/2011   BILITOT 4.2* 04/18/2011   ALKPHOS 74 04/18/2011   AST  80* 04/18/2011   ALT 65* 04/18/2011  .  Micro Results Recent Results (from the past 240 hour(s))  MRSA PCR SCREENING     Status: Normal   Collection Time   04/14/11 10:24 PM      Component Value Range Status Comment   MRSA by PCR NEGATIVE  NEGATIVE  Final      Discharge Instructions     Follow with Primary MD Noralee Space, MD, MD in 3days. show your CT report to Dr Nicki Reaper and follow with your Stomach Doctor in 1-2 weeks.    Get CBC, CMP, INR, checked 3 days by Primary MD and again as instructed by your Primary MD.   Get Medicines reviewed and adjusted.  Please request your Prim.MD to go over all Hospital Tests and Procedure/Radiological results at the follow up, please get all Hospital records sent to your Prim MD by signing hospital release before you go home.  Activity: Fall precautions use walker/cane & assistance as needed  Diet: Heart Healthy,  Fluid restriction 1.8 lit/day, Aspiration precautions.  Check your Weight same time everyday, if you gain over 2 pounds, or you develop in leg swelling, experience more shortness of breath or chest pain, call your Primary MD immediately. Follow Cardiac Low Salt Diet and 1.8 lit/day fluid restriction.  Disposition Home  If you experience worsening of your admission symptoms, develop shortness of breath, life threatening emergency, suicidal or homicidal thoughts you must seek medical attention immediately by calling 911 or calling your MD immediately  if  symptoms less severe.  You Must read complete instructions/literature along with all the possible adverse reactions/side effects for all the Medicines you take and that have been prescribed to you. Take any new Medicines after you have completely understood and accpet all the possible adverse reactions/side effects.   Do not drive if your were admitted for syncope or siezures until you have seen by Primary MD or a Neurologist and advised to drive.  Do not drive when taking Pain medications.    Do not take more than prescribed Pain, Sleep and Anxiety Medications  Special Instructions: If you have smoked or chewed Tobacco  in the last 2 yrs please stop smoking, stop any regular Alcohol  and or any Recreational drug use.  Wear Seat belts while driving.  Follow-up Information    Follow up with NADEL,SCOTT M, MD. Schedule an appointment as soon as possible for a visit in 3 days.      Follow up with Dola Argyle, MD. Schedule an appointment as soon as possible for a visit in 3 days.   Contact information:   [address], [place] [phone]          Discharge Medications   Medication List  As of 04/19/2011  2:01 PM   CONTINUE taking these medications         aspirin 81 MG tablet      B-12 1000 MCG Caps      carvedilol 6.25 MG tablet   Commonly known as: COREG   Take 1 tablet (6.25 mg total) by mouth 2 (two) times daily with a meal. 1 tablet twice a day      cholecalciferol 1000 UNITS tablet   Commonly known as: VITAMIN D      folic acid 1 MG tablet   Commonly known as: FOLVITE      furosemide 40 MG tablet   Commonly known as: LASIX   Take 1  tablet (40 mg total) by mouth daily. One tablet once a day      hydrocortisone 2.5 % rectal cream   Commonly known as: ANUSOL-HC      lisinopril 10 MG tablet   Commonly known as: PRINIVIL,ZESTRIL   Take 1 tablet (10 mg total) by mouth daily.      mercaptopurine 50  MG tablet   Commonly known as: PURINETHOL      simvastatin 20 MG tablet   Commonly known as: ZOCOR   Take 1 tablet (20 mg total) by mouth at bedtime.      spironolactone 25 MG tablet   Commonly known as: ALDACTONE   Take 1 tablet (25 mg total) by mouth daily. One tablet once a day      warfarin 5 MG tablet   Commonly known as: COUMADIN             Total Time in preparing paper work, data evaluation and todays exam - 35 minutes  Thurnell Lose M.D on 04/19/2011 at 2:01 PM  [facility] Office  [phone]

## 2011-04-19 NOTE — Discharge Instructions (Signed)
Follow with Primary MD Noralee Space, MD, MD in 3days , show your CT report to Dr Nicki Reaper and follow with your Stomach Doctor in 1-2 weeks.  Get CBC, CMP, INR, checked 3 days by Primary MD and again as instructed by your Primary MD.   Get Medicines reviewed and adjusted.  Please request your Prim.MD to go over all Hospital Tests and Procedure/Radiological results at the follow up, please get all Hospital records sent to your Prim MD by signing hospital release before you go home.  Activity: Fall precautions use walker/cane & assistance as needed  Diet: Heart Healthy,  Fluid restriction 1.8 lit/day, Aspiration precautions.  Check your Weight same time everyday, if you gain over 2 pounds, or you develop in leg swelling, experience more shortness of breath or chest pain, call your Primary MD immediately. Follow Cardiac Low Salt Diet and 1.8 lit/day fluid restriction.  Disposition Home  If you experience worsening of your admission symptoms, develop shortness of breath, life threatening emergency, suicidal or homicidal thoughts you must seek medical attention immediately by calling 911 or calling your MD immediately  if symptoms less severe.  You Must read complete instructions/literature along with all the possible adverse reactions/side effects for all the Medicines you take and that have been prescribed to you. Take any new Medicines after you have completely understood and accpet all the possible adverse reactions/side effects.   Do not drive if your were admitted for syncope or siezures until you have seen by Primary MD or a Neurologist and advised to drive.  Do not drive when taking Pain medications.    Do not take more than prescribed Pain, Sleep and Anxiety Medications  Special Instructions: If you have smoked or chewed Tobacco  in the last 2 yrs please stop smoking, stop any regular Alcohol  and or any Recreational drug use.  Wear Seat belts while driving.

## 2011-04-19 NOTE — Telephone Encounter (Signed)
LMTCB

## 2011-04-19 NOTE — Progress Notes (Signed)
ANTICOAGULATION CONSULT NOTE - Follow Up Consult  Pharmacy Consult for warfarin Indication: Hx mesenteric clot  No Known Allergies  Patient Measurements: Height: 6' (182.9 cm) Weight: 192 lb 9.6 oz (87.363 kg) IBW/kg (Calculated) : 77.6    Vital Signs: Temp: 98.6 F (37 C) (03/19 0451) Temp src: Oral (03/19 0451) BP: 114/64 mmHg (03/19 0451) Pulse Rate: 60  (03/19 0451)  Labs:  Basename 04/19/11 0509 04/18/11 0530 04/17/11 1556 04/17/11 0616  HGB 12.6* 13.0 -- --  HCT 35.4* 36.6* -- 39.2  PLT -- 73* -- 77*  APTT -- -- -- --  LABPROT 22.0* 18.2* -- 18.5*  INR 1.89* 1.48 -- 1.51*  HEPARINUNFRC -- 0.71* 0.59 0.82*  CREATININE -- 0.90 -- 0.82  CKTOTAL -- -- -- --  CKMB -- -- -- --  TROPONINI -- -- -- --   Estimated Creatinine Clearance: 75.4 ml/min (by C-G formula based on Cr of 0.9).   Medications:  Scheduled:     . antiseptic oral rinse  15 mL Mouth Rinse BID  . aspirin EC  81 mg Oral Daily  . carvedilol  3.125 mg Oral BID WC  . furosemide  40 mg Oral Daily  . hydrocortisone  1 application Rectal BID  . pantoprazole (PROTONIX) IV  40 mg Intravenous Q12H  . polyethylene glycol  17 g Per NG tube Daily  . simvastatin  20 mg Oral QHS  . sodium chloride  3 mL Intravenous Q12H  . warfarin  20 mg Oral ONCE-1800  . Warfarin - Pharmacist Dosing Inpatient   Does not apply q1800   Inpatient warfarin doses:  3/17 20m, 3/18 246m Assessment:  [age] M admit with partial SBO.  Chronic warfarin held on admission in case surgical intervention was needed;  warfarin resumed 3/17.  Heparin stopped 3/18 d/t several nosebleeds.    INR subtherapeutic at 1.89, but increasing as expected  H/H is decreased slightly, Plt remain low.    No additional bleeding or complications noted.  Goal of Therapy:  INR 2-3   Plan:   Warfarin 1519mO tonight  Daily heparin level and INR    ChrGretta ArabarmD, BCPS Pager [phone] 12:23 PM

## 2011-04-19 NOTE — Telephone Encounter (Signed)
Spoke with pt and wife and we r/s appt to Thursday with PA appt.

## 2011-04-20 NOTE — Telephone Encounter (Signed)
I spoke with spouse and she stated she was fine with pt seeing TP. Pt is scheduled to come in on 04/22/11 at 9:30 for hfu

## 2011-04-21 ENCOUNTER — Encounter: Payer: Self-pay | Admitting: Physician Assistant

## 2011-04-21 ENCOUNTER — Ambulatory Visit (INDEPENDENT_AMBULATORY_CARE_PROVIDER_SITE_OTHER): Payer: Medicare Other | Admitting: Physician Assistant

## 2011-04-21 ENCOUNTER — Ambulatory Visit (INDEPENDENT_AMBULATORY_CARE_PROVIDER_SITE_OTHER): Payer: Medicare Other | Admitting: *Deleted

## 2011-04-21 VITALS — BP 99/55 | HR 60 | Ht 72.0 in | Wt 198.0 lb

## 2011-04-21 DIAGNOSIS — K56609 Unspecified intestinal obstruction, unspecified as to partial versus complete obstruction: Secondary | ICD-10-CM | POA: Diagnosis not present

## 2011-04-21 DIAGNOSIS — I2510 Atherosclerotic heart disease of native coronary artery without angina pectoris: Secondary | ICD-10-CM

## 2011-04-21 DIAGNOSIS — I779 Disorder of arteries and arterioles, unspecified: Secondary | ICD-10-CM

## 2011-04-21 DIAGNOSIS — I5022 Chronic systolic (congestive) heart failure: Secondary | ICD-10-CM | POA: Diagnosis not present

## 2011-04-21 DIAGNOSIS — K55059 Acute (reversible) ischemia of intestine, part and extent unspecified: Secondary | ICD-10-CM | POA: Diagnosis not present

## 2011-04-21 DIAGNOSIS — K55069 Acute infarction of intestine, part and extent unspecified: Secondary | ICD-10-CM

## 2011-04-21 DIAGNOSIS — I10 Essential (primary) hypertension: Secondary | ICD-10-CM | POA: Diagnosis not present

## 2011-04-21 NOTE — Patient Instructions (Addendum)
Your physician recommends that you continue on your current medications as directed. Please refer to the Current Medication list given to you today.  Your physician recommends that you follow-up as planned with Dr. Caryl Comes in July

## 2011-04-21 NOTE — Progress Notes (Signed)
[name], [place]  [address] Phone: [phone] Fax:  [phone]  Date:  04/21/2011   Name:  [name]       DOB:  05-07-1931 MRN:  [id number]  PCP:  Dr. Lenna [name]  Primary Cardiologist:  Dr. Cleatis [name]  Primary Electrophysiologist:  Dr. Virl [name]    History of Present Illness: [name] is a 78 y.o. male who presents for post hospital follow up.  He has a history of CAD, status post CABG in 1994, ischemic cardiomyopathy EF 35%, status post AICD, sinus bradycardia, prior mesenteric thrombosis, chronic Coumadin therapy, Crohn's disease, hypertension, hyperlipidemia, systolic heart failure, mitral regurgitation, left bundle branch block, polymyalgia rheumatica, carotid stenosis.  Last LHC 2006: Occluded vein graft to diagonal, other grafts patent.  Last nuclear study 10/2009: Extensive scar anteroseptal and apical walls, no ischemia.  Last echo 12/2009: EF 35%, mild MR.  Carotid Dopplers 2/2012Colon 0-39% bilateral.  He was admitted 3/14-3/19 with small bowel obstruction in the setting of obstructive ventral hernia.  Conservative measures were employed but it was felt that he would likely need surgery.  He was seen by Dr. Martinique for surgical clearance.  The patient had no recent symptoms of angina or congestive heart failure and it was felt that no further cardiac testing is warranted.  His Coumadin was held initially in preparation for possible surgery.  Fortunately he did not require surgery.  His Coumadin was reinitiated.  Volume status remained stable and he had no cardiac complications.  Since d/c, he is doing well.  Denies significant changes to his breathing.  No chest pain.  No orthopnea, PND, edema.  No syncope.  No ICD discharge.  He is chronically weak.  No changes.    Past Medical History  Diagnosis Date  . Dizziness     Positional dizziness  . Hypertension   . Ischemic cardiomyopathy     CABG 1994 CAD catherization 1996.[name]  occluded vein graft to the diagnol, other grafts patent/  catherization 2006, no PCI/ nuclear.[name] october 2011.[name] extensive scar anteroseptal and apical.. no ischemia    . CHF (congestive heart failure)     chronic systolic  . Mitral regurgitation     mild.[name] echo november 2011  EF 35%...echo.. november 2009/ ef 35%.[name] echo november 2011  . Sinus bradycardia   . Chronotropic incompetence   . Biventricular implantable cardiac defibrillator in situ     GDT CONTAK H170-MADIT-CRT-EXPLANTED 2011 implanted defibrillator- Guidant cognis, model n119-2001 Dr. Caryl Comes (11/28/2009)  . LBBB (left bundle branch block)   . Hyperlipemia   . GERD (gastroesophageal reflux disease)   . Colonic polyp   . Crohn's disease   . Acute vascular insufficiency of intestine     Mesenteric embolus...surgery 2003  . Ventral hernia   . Hypertrophy of prostate with urinary obstruction and other lower urinary tract symptoms (LUTS)   . Degenerative joint disease   . Polymyalgia rheumatica   . Tremor   . Anxiety   . Shingles   . Vitamin B12 deficiency   . CAD (coronary artery disease)     CABG 1994  /   catheterization 1996, occluded vein graft to the diagonal, other grafts patent  /   catheterization 2006, no PCI  /  nuclear, October, 2011, extensive scar anteroseptal and apical, no ischemia  . Hx of CABG     1994  . Ejection fraction     EF 35%, echo, November, 2011  . Warfarin anticoagulation  Coumadin therapy for mesenteric embolus 2003  . Carotid artery disease     Doppler, February,  2012, 0-39% bilateral, recommended followup 1 year    Current Outpatient Prescriptions  Medication Sig Dispense Refill  . aspirin 81 MG tablet Take 81 mg by mouth daily.        . carvedilol (COREG) 6.25 MG tablet Take 1 tablet (6.25 mg total) by mouth 2 (two) times daily with a meal. 1 tablet twice a day  180 tablet  3  . cholecalciferol (VITAMIN D) 1000 UNITS tablet Take 1,000-2,000 Units by mouth daily. 1-2 tablets once a day       . Cyanocobalamin (B-12) 1000 MCG CAPS Take 1 capsule by mouth daily.        . folic acid (FOLVITE) 1 MG tablet Take 1 mg by mouth daily.        . furosemide (LASIX) 40 MG tablet Take 1 tablet (40 mg total) by mouth daily. One tablet once a day  90 tablet  3  . hydrocortisone (ANUSOL-HC) 2.5 % rectal cream Place 1 application rectally 2 (two) times daily. Use as directed      . lisinopril (PRINIVIL,ZESTRIL) 10 MG tablet Take 1 tablet (10 mg total) by mouth daily.  90 tablet  3  . mercaptopurine (PURINETHOL) 50 MG tablet Take 75 mg by mouth daily. Take 1.5 tabs by mouth once daily as directed by Dr. Su Ley      . simvastatin (ZOCOR) 20 MG tablet Take 1 tablet (20 mg total) by mouth at bedtime.  90 tablet  3  . spironolactone (ALDACTONE) 25 MG tablet Take 1 tablet (25 mg total) by mouth daily. One tablet once a day  90 tablet  3  . warfarin (COUMADIN) 5 MG tablet Take 15-20 mg by mouth daily. TAKES 20MG ON [facility] AND Friday,AND TAKES 15MG Tuesday Thursday Saturday AND Sunday        Allergies: No Known Allergies  History  Substance Use Topics  . Smoking status: Former Smoker -- 1.0 packs/day for 20 years    Types: Cigarettes    Quit date: 01/31/1973  . Smokeless tobacco: Never Used  . Alcohol Use: Yes     occasional     ROS:  Please see the history of present illness.   Eating ok.  No abdominal pain.  Notes some sinus congestion.  No fever.  No dental pain.  No purulent nasal drainage.   All other systems reviewed and negative.   PHYSICAL EXAM: VS:  BP 99/55  Pulse 60  Ht 6' (1.829 m)  Wt 198 lb (89.812 kg)  BMI 26.85 kg/m2 Well nourished, well developed, in no acute distress HEENT: normal Neck: no JVD Cardiac:  normal S1, S2; RRR; no murmur Lungs:  clear to auscultation bilaterally, no wheezing, rhonchi or rales Abd: soft, nontender, no hepatomegaly Ext: trace bialt edema Skin: warm and dry Neuro:  CNs 2-12 intact, no focal abnormalities noted  EKG:  V pace, HR  60  ASSESSMENT AND PLAN:  1. CAD (coronary artery disease)  Stable.  No angina.  Continue current regimen.  Follow up with Dr. Cleatis [name] as planned.  Follow up with Dr. Virl [name] as planned.   2. Chronic systolic heart failure  Volume stable.  Encouraged daily weights.  Continue current Rx.  Follow up as above.   3. Hypertension  BP somewhat low.  Asymptomatic.  May be running low from change in diet during recent admission.  No changes today in therapy.  4. SBO (small bowel obstruction)  Seems to be recovering well.  Sees his PCP for follow up tomorrow.     Signed, Richardson Dopp, PA-C  9:08 AM 04/21/2011

## 2011-04-22 ENCOUNTER — Encounter: Payer: Self-pay | Admitting: Adult Health

## 2011-04-22 ENCOUNTER — Ambulatory Visit (INDEPENDENT_AMBULATORY_CARE_PROVIDER_SITE_OTHER): Payer: Medicare Other | Admitting: Adult Health

## 2011-04-22 VITALS — BP 118/72 | HR 60 | Temp 97.0°F | Ht 72.0 in | Wt 199.8 lb

## 2011-04-22 DIAGNOSIS — K5090 Crohn's disease, unspecified, without complications: Secondary | ICD-10-CM

## 2011-04-22 DIAGNOSIS — K55059 Acute (reversible) ischemia of intestine, part and extent unspecified: Secondary | ICD-10-CM | POA: Diagnosis not present

## 2011-04-22 DIAGNOSIS — K56609 Unspecified intestinal obstruction, unspecified as to partial versus complete obstruction: Secondary | ICD-10-CM

## 2011-04-22 NOTE — Assessment & Plan Note (Signed)
Previous seen by DR. Kellie Simmering on chronic coumaidn  Recent CT showed Penetrating atherosclerotic ulcer in the infrarenal aorta is noted  on image 25. Maximal aortic caliber at this level is 3.2 cm. Mild ectasia of the right and left common iliac artery with caliber is  of 1.8 and 1.8 cm respectively. --compared report to CT 2004 with notable STABLE 3CM INFRARENAL SUPRAILIAC FOCAL ABDOMINAL AORTIC ANEURYSM.>>Does not appear to be significant change. Recommend cont follow up with Dr. Kellie Simmering and remain on coumadin as directed.

## 2011-04-22 NOTE — Patient Instructions (Signed)
Continue on current regimen.  follow up Dr. Lenna Gilford  In 1 month as planned and As needed

## 2011-04-22 NOTE — Progress Notes (Signed)
Subjective:    Patient ID: [name], male    DOB: 1931-11-16, 78 y.o.   MRN: [id number]  HPI  78 y/o WM    ~  [place] by [name] [place] for [name] w/ sm bowel Crohn's- s/p mult resections w/ complications, stable on 6-MP for prevention of recurrence... also has sl incr fasting bili c/w Gilbert's... ~  followed by [name] for Cards- w/ CAD, s/p CABG, Cardiomyopathy w/ AICD- stable... f/u 2DEcho 11/09 showed diffuseHK w/ EF= 35% & DD, mod incr AoV thickness w/o AS... EKG= pacer rhythm.  ~  July 21, 2009:  he saw [name] 6/11 w/ some incr edema & SPIRONOLACTONE 12.14m/d started & KCl stopped... he notes dizziness improved, still w/ mult somatic complaints & we discussed holding tight for now.  ~  November 18, 2009:  he has maintained regular f/u w/ Cards- DBenay Spice8/11 & [name] 10/11- pacer generator near EOL and pt noted decr energy "I'm slowing down, harder to get going";  they plan Myoview 1st, then device generator replacement... he is due for a follow up appt w/ DrChowdury as well... due for Flu shot today & f/u fasting labs (see below)...  ~  May 19, 2010:  667moOV & he notes that his Crohn's has been acting up- followed by [name] in [place] on 6MP & ?cholestyramine added?  He wants Prednisone restarted & I asked him to call [name]- now [name] to discuss, in the meanwhile we will give him a Pred taper, but he understands that a regular dose of this med requires [name] to follow response;      He saw DrBenay [name]/12 & [name] 12/11- cardiomyop stable (extensive old scar, no ischemia, EF=34%), CDopplers no change, AICD replaced 10/11 & then reprogrammed for chronotropic incompetence, Coreg decr to 6.25Bid...  He had labs 10/11 & these are reviewed w/ pt & wife...  ~  November 24, 2010:  34m37moV & he has been generally stable, notes some discomfort in his lower back & left hip area w/ occas radiation down left leg; we discussed heat, Advil, exercises, and the need for Ortho eval if the discomfort persists or  worsens...     HBP, CAD, Cardiomyop> on Coreg6.25Bid, Lisinopril10, Lasix40, Spironolactone25 & Coumadin; BP= 126/68 & he denies CP, palpit, dizzy, ch in SOB, edema, etc; he saw [name] 7/12- felt to be stable on meds & defibrillator interrogated, no changes made...    Chol> on Simva20 & f/u FLP looks good, at goal, continue same...    [name]> Gerd, Polyps, Crohn's> on 6MP per [name]; stable, continue same rx...    Hx intestinal vasc insuffic 2003> he remains on Coumadin followed in the [facility] & stable...    GU> BPH followed by [name] & prev on Flomax but he stopped & states voiding is satis etc...    DJD/ ?PMR> off Pred, he states he read where this "comes from crohn's" he says; see above & he will try Heat, Advil, Tylenol...    Tremor/ Anxiety> prev on Klonopin 0.63m74m from us &[place], [name], but he stopped on his own...  04/14/2011 Acute OV  Pt presents for an acute office visit. Complains of severe  n/v , clammy/sweats and abdominal pain  on right side x1 day with pain and hard to touch. Has hx of multiple abdominal hernias. +fever and chills. Very weak.  Vomiting up black and green emesis. . HeMarland Kitchenis very weak and appears jaundiced on exam table. Appears very ill.  Has a hx of  left inguinal hernia repair 6/08 by [name], and had a prev incarcerated ventral hernia repaired 11/03... his residual/ recurrent ventral hernia has been evaluated by [name] who feels that he would need eval in [place] if this hernia comes to surg . >>admitted   04/22/2011 [name] follow up  Patient returns for a post [name] followup. Patient was admitted 04/14/2011 to 04/19/2011 for a small bowel obstruction. Due to initially obstructive ventricle hernia, which was reduced in the emergency room. Status post NG tube placement for bowel decompression. He was placed on bowel rest. And slowly improved with out, requiring surgical intervention. CT abd/pelvis showed  small bowel partial or early small bowel  obstruction pattern secondary to and anterior abdominal wall hernia and wall defect  inflammation within the right side the abdomen  and anterior abdomen Also a Penetrating atherosclerotic ulcer in the infrarenal aorta is noted  on image 45. Maximal aortic caliber at this level is 3.2 cm. Mild ectasia of the right and left common iliac artery with caliber is of 1.8 and 1.8 cm respectively.  Gastroesophageal varices of unknown significance. He is followed by  [name] [place] by [name] . He wishes to change to [name] specialist in [place] due it is hard to travel to [name] .   Since discharge. He is feeling much better with improved appetite. He denies any bleeding, bloody stools, nosebleeds. Is tolerating food well without any nausea, vomiting, or diarrhea. Bowel movements have returned back to his baseline. He denies any fever, chest pain, shortness of breath.         Problem List:  HBP, CAD, Cardiomyopathy - followed by [name] and [name]... he had CABG in 1994 (with cath in 96 revealing a total right and an 80% OM, vein graft to the diagonal was occluded, other bypasses were patent), Biv AICD in 2006...  On COUMADIN (follow in clinic), ASA 86m/d,  COREG 6.285mid,  LISINOPRIL 2032m,  LASIX 32m67m  SPIRONOLACTONE 25mg9m. ~  Cardiolite 3/03 w/ EF=37%...  ~  Cath 6/06 w/ native vessel and graft disease...  ~  2DEcho 11/09 stable w/ mod LVD- diffuse HK w/ EF= 35%, DD, mod incr AoV thickness w/o AS...  ~  3/11:  CXR showed stable CABG, AICD, & chr changes... ~  10/11:  Myoview showed extensive scar, no ischemia, EF= 34% ~  10/11:  New AICD placed by [name]... ~  11/11:  2DEcho showed mod reduced LV sys function w/ EF= 30-35%, diffuse HK, gr1 DD> see report...  HYPERLIPIDEMIA - he takes SIMVASTATIN 20mg/49m he's had min, non-progressive incr LFTs noted. ~  FLP 4/Dicks[place]howed TChol 138, TG 138, HDL 38, LDL 72... tolerating med well. ~  FLP 4/[place] TChol 118, TG 111, HDL 33, LDL 63 ~  FLP 12/09 showed  TChol 118, TG 100, HDL 31, LDL 67 ~  FLP 10/10 on Simva20 showed TChol 111, TG 139, HDL 36, LDL 47 ~  FLP 10/11 on Simva20 showed TChol 101, TG 102, HDL 35, LDL 45 ~  FLP 10/12 on Simva20 showed TChol 124, TG 109, HDL 49, LDL 54  GERD (ICD-530.81) - no active symptoms and not currently taking PPI etc...  COLONIC POLYPS (ICD-211.3) - last colonoscopy here was 7/04 by DrKaplDemetra Shinervertics and several polyps from 6 - 20mm s45m reportedly adenomatous on bx but I cannot find the path reports... he is overdue for f/u colon... ~  labs 2/10 showed Hg= 15.7... we wMarland KitchenMarland Kitchenl send copy of note  to [name] w/ request for f/u colonoscopy. ~  f/u colonoscopy 11/10 by [name] showed norm TI & several polyps removed- path +tubular adenoma...  CROHN'S DISEASE (ICD-555.9) - he is followed by [name] in [place] on 6-MP 28m tabs- 1.5 tabs/d... he notes intermittent diarrhea and was started on Questran Prn... ~  labs 3/11 noted sl incr LFTs, Protime, & Sed... ~  labs 10/11 showed norm chems & LFTs x incr bili, Plat=88K, BNP=219, otherw norm. ~  Labs 10/12 showed norm chems, SGOT=59, TBili=3.9, Plat=85K...  Hx of ACUTE VASCULAR INSUFFICIENCY OF INTESTINE (ICD-557.0) - he has embolectomy of the superior mesenteric artery 1/03 by DSheryn Bison(long hospitalization w/ complications)... he has remained on COUMADIN in the coumadin clinic ever since then...  VENTRAL HERNIA (ICD-553.20) - he is s/p left inguinal hernia repair 6/08 by DClarita [name] and had a prev incarcerated ventral hernia repaired 11/03... his residual/ recurrent ventral hernia has been evaluated by DClarita [place]who [place]... currently he wears an abd binder when up and about...  HYPERTROPHY PROSTATE W/UR OBST & OTH LUTS (ICD-600.01) - saw [name] 7/10 w/ BPH, obstructive symptoms, and ED- given  Flomax & "shots"...  ~  Labs 10/12 showed PSA= 2.93  DEGENERATIVE JOINT DISEASE (ICD-715.90)  Hx of  POLYMYALGIA RHEUMATICA (ICD-725) - hasn't req steroids x yrs... ~  labs 2/10 showed Sed= 8 ~  labs 10/10 showed Sed= 6 ~  labs 3/11 showed Sed= 51 w/ acute illness. ~  labs 10/11 showed Sed= 9 ~  Labs 10/12 showed Sed= 9  TREMOR (ICD-781.0) - eval 3/10 by DDalphine Handing [facility]- essential tremor w/ +FamHx in sister... trial Clonazepam 0.266mBid w/ improvement...  ANXIETY (ICD-300.00) - he uses the same Clonazepam 0.2529mrn...  Hx of SHINGLES (ICD-053.9)  Thrombocytopenia >> serial labs w/ Plat counts betw 223 ==> 85; no bleeding diathesis, & he had acute intestinal vasc insuffic 2003 requiring embolectomy & has been on Coumadin Rx ever since then... ~    VITAMIN B12 DEFICIENCY (ICD-266.2) - on B 12 1000m47m OTC... ~  labs 2/10 showed B12 level = 215... rec start oral B12 supplement daily. ~  labs 10/10 showed B12 level = 416... continue oral B12 supplement. ~  labs 10/11 showed B12= 592... continue same. ~  Labs 10/12 showed B12= 573  VITAMIN D DEFICIENCY (ICD-268.9) - on Vit D 1000 u daily OTC... ~  labs 2/10 showed Vit D level = 23... rec> start Vit D 1000 u daily.. ~  Labs 10/12 showed Vit D level = 50   Past Surgical History  Procedure Date  . Coronary artery bypass graft 1994  . Laparoscopic cholecystectomy 2002  . Elap w/ superior mesenteric artery embolectomy 1/03    by Dr. LawsJennette BankerRepair of incarcerated ventral hernia and lysis of adhesions 11/03    by Dr. HingBetsy PriesImplantation of biventric cardioverter-defibrillatorr     by Dr. TaylLovena Le8/06 Guidant Cont[place]1  . Implantation of guidant cognis device     model [id number] inguinal hernia repair with mesh 6/08    by Dr. HingBetsy PriesOutpatient Encounter Prescriptions as of 04/22/2011  Medication Sig Dispense Refill  . aspirin 81 MG tablet Take 81 mg by mouth daily.        . carvedilol (COREG) 6.25 MG tablet Take 6.25 mg by mouth 2 (two) times daily with a meal.      . cholecalciferol  (  VITAMIN D) 1000 UNITS tablet Take 1,000-2,000 Units by mouth daily.       . Cyanocobalamin (B-12) 1000 MCG CAPS Take 1 capsule by mouth daily.        . folic acid (FOLVITE) 1 MG tablet Take 1 mg by mouth daily.        . furosemide (LASIX) 40 MG tablet Take 40 mg by mouth daily.      . hydrocortisone (ANUSOL-HC) 2.5 % rectal cream Place 1 application rectally 2 (two) times daily.       [name] lisinopril (PRINIVIL,ZESTRIL) 10 MG tablet Take 1 tablet (10 mg total) by mouth daily.  90 tablet  3  . mercaptopurine (PURINETHOL) 50 MG tablet Take 1.5 tabs by mouth once daily as directed by Dr. Su Ley      . simvastatin (ZOCOR) 20 MG tablet Take 1 tablet (20 mg total) by mouth at bedtime.  90 tablet  3  . spironolactone (ALDACTONE) 25 MG tablet Take 25 mg by mouth daily.      [name] warfarin (COUMADIN) 5 MG tablet TAKES 20MG ON [name] AND Friday,AND TAKES 15MG Tuesday Thursday Saturday AND Sunday      . DISCONTD: carvedilol (COREG) 6.25 MG tablet Take 1 tablet (6.25 mg total) by mouth 2 (two) times daily with a meal. 1 tablet twice a day  180 tablet  3  . DISCONTD: furosemide (LASIX) 40 MG tablet Take 1 tablet (40 mg total) by mouth daily. One tablet once a day  90 tablet  3  . DISCONTD: spironolactone (ALDACTONE) 25 MG tablet Take 1 tablet (25 mg total) by mouth daily. One tablet once a day  90 tablet  3    No Known Allergies   Current Medications, Allergies, Past Medical History, Past Surgical History, Family History, and Social History were reviewed in [employer] record.    Review of Systems         Constitutional:   No  weight loss, night sweats,  + Fevers, chills, fatigue, or  lassitude.  HEENT:   No headaches,  Difficulty swallowing,  Tooth/dental problems, or  Sore throat,                No sneezing, itching, ear ache, nasal congestion, post nasal drip,   CV:  No chest pain,  Orthopnea, PND, swelling in lower extremities, anasarca, dizziness, palpitations,  syncope.   [name]  No heartburn, indigestion, + abdominal pain, nausea, vomiting,  loss of appetite,   Resp: No shortness of breath with exertion or at rest.  No excess mucus, no productive cough,  No non-productive cough,  No coughing up of blood.  No change in color of mucus.  No wheezing.  No chest wall deformity  Skin: no rash or lesions.  GU: no dysuria, change in color of urine, no urgency or frequency.  No flank pain, no hematuria   MS:  No joint pain or swelling.  No decreased range of motion.  No back pain.  Psych:  No change in mood or affect. No depression or anxiety.  No memory loss.       Objective:   Physical Exam     WD, WN, 78 y/o WM appears very ill  GENERAL:  Alert & oriented;   HEENT:  [place]/AT  EACs-clear, TMs-wnl, NOSE- clear THROAT-clear & wnl. NECK:  Supple w/ fairROM; no JVD; normal carotid impulses w/o bruits; no thyromegaly or nodules palpated; no lymphadenopathy. CHEST:  Decr BS w , no wheezing, no rales,  no signs of consolidation. HEART:  Regular Rhythm; gr 1/6 SEM without rubs or gallops detected.Marland KitchenMarland KitchenLeft upper chest wall with defib/pacemaker  ABDOMEN:  Soft & non tender ; mult scars, & several abdominal hernia-reduceable   , +BS .  EXT: without deformities, mod arthritic changes; no varicose veins/ venous insuffic/ or edema. NEURO:   no focal neuro deficits... DERM:  Appears Jaundiced    Assessment & Plan:

## 2011-04-22 NOTE — Progress Notes (Signed)
Addended by: Parke Poisson E on: 04/22/2011 10:45 AM   Modules accepted: Orders

## 2011-04-22 NOTE — Assessment & Plan Note (Addendum)
Will need follow up with GI  Pt requests referral to [facility] , can not travel to [facility] anymore Refer to Dr. Ardis Hughs in GI for follow up of Chrons , chronic GI issues and ? New finding on CT abd- varices

## 2011-04-22 NOTE — Assessment & Plan Note (Signed)
Resolved s/p hospitalization with bowel rest and hernia reduction -did not require surgcial intervention

## 2011-05-12 ENCOUNTER — Ambulatory Visit (INDEPENDENT_AMBULATORY_CARE_PROVIDER_SITE_OTHER): Payer: Medicare Other | Admitting: *Deleted

## 2011-05-12 DIAGNOSIS — K55059 Acute (reversible) ischemia of intestine, part and extent unspecified: Secondary | ICD-10-CM

## 2011-05-12 DIAGNOSIS — K55069 Acute infarction of intestine, part and extent unspecified: Secondary | ICD-10-CM

## 2011-05-12 LAB — POCT INR: INR: 8

## 2011-05-12 LAB — PROTIME-INR
INR: 11.3 ratio (ref 0.8–1.0)
Prothrombin Time: 127.2 s (ref 10.2–12.4)

## 2011-05-12 MED ORDER — PHYTONADIONE 5 MG PO TABS: ORAL_TABLET | ORAL | Status: DC

## 2011-05-12 NOTE — Patient Instructions (Signed)

## 2011-05-16 ENCOUNTER — Ambulatory Visit (INDEPENDENT_AMBULATORY_CARE_PROVIDER_SITE_OTHER): Payer: Medicare Other | Admitting: Pharmacist

## 2011-05-16 DIAGNOSIS — K55069 Acute infarction of intestine, part and extent unspecified: Secondary | ICD-10-CM

## 2011-05-16 DIAGNOSIS — I509 Heart failure, unspecified: Secondary | ICD-10-CM | POA: Diagnosis not present

## 2011-05-16 DIAGNOSIS — Z86718 Personal history of other venous thrombosis and embolism: Secondary | ICD-10-CM | POA: Diagnosis not present

## 2011-05-16 DIAGNOSIS — K5090 Crohn's disease, unspecified, without complications: Secondary | ICD-10-CM | POA: Diagnosis not present

## 2011-05-16 DIAGNOSIS — K55059 Acute (reversible) ischemia of intestine, part and extent unspecified: Secondary | ICD-10-CM | POA: Diagnosis not present

## 2011-05-16 DIAGNOSIS — Z7901 Long term (current) use of anticoagulants: Secondary | ICD-10-CM | POA: Diagnosis not present

## 2011-05-16 DIAGNOSIS — Z95810 Presence of automatic (implantable) cardiac defibrillator: Secondary | ICD-10-CM | POA: Diagnosis not present

## 2011-05-16 DIAGNOSIS — D696 Thrombocytopenia, unspecified: Secondary | ICD-10-CM | POA: Diagnosis not present

## 2011-05-19 ENCOUNTER — Ambulatory Visit (INDEPENDENT_AMBULATORY_CARE_PROVIDER_SITE_OTHER): Payer: Medicare Other | Admitting: *Deleted

## 2011-05-19 ENCOUNTER — Encounter: Payer: Self-pay | Admitting: Internal Medicine

## 2011-05-19 DIAGNOSIS — I428 Other cardiomyopathies: Secondary | ICD-10-CM

## 2011-05-25 ENCOUNTER — Ambulatory Visit (INDEPENDENT_AMBULATORY_CARE_PROVIDER_SITE_OTHER): Payer: Medicare Other | Admitting: Pulmonary Disease

## 2011-05-25 ENCOUNTER — Ambulatory Visit: Payer: Self-pay | Admitting: Cardiology

## 2011-05-25 ENCOUNTER — Other Ambulatory Visit (INDEPENDENT_AMBULATORY_CARE_PROVIDER_SITE_OTHER): Payer: Medicare Other

## 2011-05-25 ENCOUNTER — Encounter: Payer: Self-pay | Admitting: Pulmonary Disease

## 2011-05-25 VITALS — BP 118/78 | HR 60 | Temp 96.9°F | Ht 72.0 in | Wt 198.4 lb

## 2011-05-25 DIAGNOSIS — R251 Tremor, unspecified: Secondary | ICD-10-CM

## 2011-05-25 DIAGNOSIS — I10 Essential (primary) hypertension: Secondary | ICD-10-CM | POA: Diagnosis not present

## 2011-05-25 DIAGNOSIS — Z7901 Long term (current) use of anticoagulants: Secondary | ICD-10-CM

## 2011-05-25 DIAGNOSIS — D649 Anemia, unspecified: Secondary | ICD-10-CM

## 2011-05-25 DIAGNOSIS — F411 Generalized anxiety disorder: Secondary | ICD-10-CM | POA: Diagnosis not present

## 2011-05-25 DIAGNOSIS — I5022 Chronic systolic (congestive) heart failure: Secondary | ICD-10-CM | POA: Diagnosis not present

## 2011-05-25 DIAGNOSIS — Z95810 Presence of automatic (implantable) cardiac defibrillator: Secondary | ICD-10-CM

## 2011-05-25 DIAGNOSIS — I2510 Atherosclerotic heart disease of native coronary artery without angina pectoris: Secondary | ICD-10-CM

## 2011-05-25 DIAGNOSIS — E785 Hyperlipidemia, unspecified: Secondary | ICD-10-CM

## 2011-05-25 DIAGNOSIS — K439 Ventral hernia without obstruction or gangrene: Secondary | ICD-10-CM

## 2011-05-25 DIAGNOSIS — R5383 Other fatigue: Secondary | ICD-10-CM

## 2011-05-25 DIAGNOSIS — N401 Enlarged prostate with lower urinary tract symptoms: Secondary | ICD-10-CM

## 2011-05-25 DIAGNOSIS — K56609 Unspecified intestinal obstruction, unspecified as to partial versus complete obstruction: Secondary | ICD-10-CM

## 2011-05-25 DIAGNOSIS — N138 Other obstructive and reflux uropathy: Secondary | ICD-10-CM

## 2011-05-25 DIAGNOSIS — E538 Deficiency of other specified B group vitamins: Secondary | ICD-10-CM

## 2011-05-25 DIAGNOSIS — K55069 Acute infarction of intestine, part and extent unspecified: Secondary | ICD-10-CM

## 2011-05-25 DIAGNOSIS — I2589 Other forms of chronic ischemic heart disease: Secondary | ICD-10-CM

## 2011-05-25 DIAGNOSIS — K5090 Crohn's disease, unspecified, without complications: Secondary | ICD-10-CM

## 2011-05-25 DIAGNOSIS — R5381 Other malaise: Secondary | ICD-10-CM | POA: Diagnosis not present

## 2011-05-25 DIAGNOSIS — M1990 Unspecified osteoarthritis, unspecified site: Secondary | ICD-10-CM

## 2011-05-25 DIAGNOSIS — K219 Gastro-esophageal reflux disease without esophagitis: Secondary | ICD-10-CM

## 2011-05-25 LAB — REMOTE ICD DEVICE
AL AMPLITUDE: 5.3 mv
BRDY-0002RA: 60 {beats}/min
BRDY-0003RA: 130 {beats}/min
CHARGE TIME: 8.5 s
LV LEAD IMPEDENCE ICD: 384 Ohm
PACEART VT: 0
RV LEAD IMPEDENCE ICD: 659 Ohm
TOT-0006: 20130117000000
TZAT-0001FASTVT: 1
TZAT-0018FASTVT: NEGATIVE
TZAT-0018FASTVT: NEGATIVE
TZST-0001FASTVT: 3
TZST-0001FASTVT: 6
TZST-0001FASTVT: 7
TZST-0003FASTVT: 41 J
TZST-0003FASTVT: 41 J

## 2011-05-25 LAB — CBC WITH DIFFERENTIAL/PLATELET
Eosinophils Relative: 2.5 % (ref 0.0–5.0)
HCT: 38.9 % — ABNORMAL LOW (ref 39.0–52.0)
Hemoglobin: 13.2 g/dL (ref 13.0–17.0)
Lymphs Abs: 1.5 10*3/uL (ref 0.7–4.0)
Monocytes Relative: 13.6 % — ABNORMAL HIGH (ref 3.0–12.0)
Neutro Abs: 2.1 10*3/uL (ref 1.4–7.7)
WBC: 4.3 10*3/uL — ABNORMAL LOW (ref 4.5–10.5)

## 2011-05-25 LAB — BASIC METABOLIC PANEL
BUN: 6 mg/dL (ref 6–23)
Calcium: 9.6 mg/dL (ref 8.4–10.5)
GFR: 95.24 mL/min (ref >60.00)
Glucose, Bld: 97 mg/dL (ref 70–99)
Sodium: 139 mEq/L (ref 135–145)

## 2011-05-25 LAB — PROTIME-INR: INR: 6.8 ratio (ref 0.8–1.0)

## 2011-05-25 LAB — IBC PANEL: Saturation Ratios: 32.3 % (ref 20.0–50.0)

## 2011-05-25 LAB — HEPATIC FUNCTION PANEL
Albumin: 3.5 g/dL (ref 3.5–5.2)
Alkaline Phosphatase: 81 U/L (ref 39–117)

## 2011-05-25 LAB — VITAMIN B12: Vitamin B-12: 812 pg/mL (ref 211–911)

## 2011-05-25 NOTE — Patient Instructions (Addendum)
Today we updated your med list in our EPIC system...    Continue your current medications the same...  Today we decided to do your f/u blood work & see if that leads to an explanation for your weakness & lack of energy...    We will call you w/ the results when avail...  In the meanwhile, rest at home & continue follow up w/ your medical specialists...  Call for any questions.Marland KitchenMarland Kitchen

## 2011-05-25 NOTE — Progress Notes (Addendum)
Subjective:    Patient ID: [name], male    DOB: 03/21/1931, 78 y.o.   MRN: [id number]  HPI 78 y/o WM here for a follow up visit... he has multiple medical problems as noted below...    ~  [place]owdhury [place] w/ sm bowel Crohn's- s/p mult resections w/ complications, stable on 6-MP for prevention of recurrence... also has sl incr fasting bili c/w Gilbert's... ~  followed by [name] for Cards- w/ CAD, s/p CABG, Cardiomyopathy w/ AICD- stable... f/u 2DEcho 11/09 showed diffuseHK w/ EF= 35% & DD, mod incr AoV thickness w/o AS... EKG= pacer rhythm.  ~  July 21, 2009:  he saw [facility] 6/11 w/ some incr edema & SPIRONOLACTONE 12.359m/d started & KCl stopped... he notes dizziness improved, still w/ mult somatic complaints & we discussed holding tight for now.  ~  November 18, 2009:  he has maintained regular f/u w/ Cards- DBenay Spice8/11 & DrKlein 10/11- pacer generator near EOL and pt noted decr energy "I'm slowing down, harder to get going";  they plan Myoview 1st, then device generator replacement... he is due for a follow up appt w/ DrChowdury as well... due for Flu shot today & f/u fasting labs (see below)...  ~  May 19, 2010:  663moOV & he notes that his Crohn's has been acting up- [place] 6MP & ?cholestyramine added?  He wants Prednisone restarted & I asked him to call GI- now DrHolmes to discuss, in the meanwhile we will give him a Pred taper, but he understands that a regular dose of this med requires GI to follow response;      He saw DrBenay Spice/12 & DrKlein 12/11- cardiomyop stable (extensive old scar, no ischemia, EF=34%), CDopplers no change, AICD replaced 10/11 & then reprogrammed for chronotropic incompetence, Coreg decr to 6.25Bid...  He had labs 10/11 & these are reviewed w/ pt & wife...  ~  November 24, 2010:  59m38moV & he has been generally stable, notes some discomfort in his lower back & left hip area w/ occas radiation down left leg; we discussed heat,  Advil, exercises, and the need for Ortho eval if the discomfort persists or worsens...     HBP, CAD, Cardiomyop> on Coreg6.25Bid, Lisinopril10, Lasix40, Spironolactone25 & Coumadin; BP= 126/68 & he denies CP, palpit, dizzy, ch in SOB, edema, etc; he saw DrKlein 7/12- felt to be stable on meds & defibrillator interrogated, no changes made...    Chol> on Simva20 & f/u FLP looks good, at goal, continue same...    GI> Gerd, Polyps, Crohn's> on 6MP [place]owdhury [place]; stable, continue same rx...    Hx intestinal vasc insuffic 2003> he remains on Coumadin followed in the CC & stable...    GU> BPH followed by DrNesi & prev on Flomax but he stopped & states voiding is satis etc...    DJD/ ?PMR> off Pred, he states he read where this "comes from crohn's" he says; see above & he will try Heat, Advil, Tylenol...    Tremor/ Anxiety> prev on Klonopin 0.60m70m from us &[place], DrYan, but he stopped on his own...  ~  May 25, 2011:  59mo 65mo& [name]/o weakness, fatigue, lack of energy; also notes freq nosebleeds on & off, not severe & rec to use Saline Q1-2H while awake; on Coumadin followed in the [facility] & requiring large doses (15-20mg/49m.     He was seen 3/13  by TP for post hosp visit after Adm for SBO>  Disch summary, XRays, & LABS reviewed in detail w/ pt & wife...    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 Last nuclear study 10/2009: Extensive scar anteroseptal and apical walls, no ischemia. Last echo 12/2009: EF 35%, mild MR. Carotid Dopplers 2/2012Colon 0-39% bilateral> No med changes initiated...    [place] was [place]; seen by DrHolmes at [facility] pt reports that they think his 6MP has caused some blood abnormality (notes pending)...  LABS 4/13:  Chems- wnl x sl incr  TBili=2.5 SGOT=45;  CBC- ok x WBC=4.3 Plat=70K;  Fe=93 (32%);  TSH=1.63;  B12=812;  Protime= way off 7 referred to CC stat.         Problem List:  HBP, CAD, Cardiomyopathy - followed by [name] and DrKlein... he had CABG in 1994 (with cath in 96 revealing a total right and an 80% OM, vein graft to the diagonal was occluded, other bypasses were patent), Biv AICD in 2006...  On COUMADIN (follow in clinic), ASA 20m/d,  COREG 6.282mid,  LISINOPRIL 2049m,  LASIX 37m62m  SPIRONOLACTONE 25mg75m. ~  Cardiolite 3/03 w/ EF=37%...  ~  Cath 6/06 w/ native vessel and graft disease...  ~  2DEcho 11/09 stable w/ mod LVD- diffuse HK w/ EF= 35%, DD, mod incr AoV thickness w/o AS...  ~  3/11:  CXR showed stable CABG, AICD, & chr changes... ~  10/11:  Myoview showed extensive scar, no ischemia, EF= 34% ~  10/11:  New AICD placed by DrKlein... ~  11/11:  2DEcho showed mod reduced LV sys function w/ EF= 30-35%, diffuse HK, gr1 DD> see report... ~  10/12:  BP= 126/68 & stable from the CV perspective... ~  4/13:  He's had f/u w/ Cards> DrKatz et al; notes reviewed, no med changes other than Coumadin regulation...  HYPERLIPIDEMIA - he takes SIMVASTATIN 20mg/8m he's had min, non-progressive incr LFTs noted. ~  FLP 4/[place] 138, TG 138, HDL 38, LDL 72... tolerating med well. ~  FLP 4/[place] 118, TG 111, HDL 33, LDL 63 ~  FLP 12/09 showed TChol 118, TG 100, HDL 31, LDL 67 ~  FLP 10/10 on Simva20 showed TChol 111, TG 139, HDL 36, LDL 47 ~  FLP 10/11 on Simva20 showed TChol 101, TG 102, HDL 35, LDL 45 ~  FLP 10/12 on Simva20 showed TChol 124, TG 109, HDL 49, LDL 54  GERD (ICD-530.81) - no active symptoms and not currently taking PPI etc...  COLONIC POLYPS (ICD-211.3) - last colonoscopy here was 7/04 by DrKaplDemetra Shinervertics and several polyps from 6 - 20mm s69m reportedly adenomatous on bx but I cannot find the path reports... he is overdue for f/u colon... ~  labs 2/10 showed Hg= 15.7... we  Marland KitchenMarland Kitchenll send copy of note to DrChowdhury w/ request for f/u colonoscopy. ~  f/u colonoscopy 11/10 by DrChowdhury showed norm TI & several polyps removed- path +tubular adenoma...  CROHN'S DISEASE (ICD-555.9) - he is [place] DrChowdhury in [place] 6-MP 50mg ta62m1.5 tabs/d... he notes intermittent diarrhea and was started on Questran Prn... ~  labs 3/11 noted sl incr LFTs, Protime, & Sed... ~  labs 10/11 showed norm chems & LFTs x  incr bili, Plat=88K, BNP=219, otherw norm. ~  Labs 10/12 showed norm chems, SGOT=59, TBili=3.9, Plat=85K...  Hx of ACUTE VASCULAR INSUFFICIENCY OF INTESTINE (ICD-557.0) - he has embolectomy of the superior mesenteric artery 1/03 by Sheryn Bison (long hospitalization w/ complications)... he has remained on COUMADIN in the coumadin clinic ever since then...  VENTRAL HERNIA (ICD-553.20) - he is s/p left inguinal hernia repair 6/08 by Clarita Leber, and had a prev incarcerated ventral hernia repaired 11/03... his residual/ recurrent ventral hernia has been evaluated by Clarita Leber who feels that he would need eval in [place] if this hernia comes to surg... currently he wears an abd binder when up and about...  SBO >> Hosp 3/14 - 04/19/11 by [facility] w/ SBO that opened up w/o surgical intervention...  HYPERTROPHY PROSTATE W/UR OBST & OTH LUTS (ICD-600.01) - saw DrNesi 7/10 w/ BPH, obstructive symptoms, and ED- given  Flomax & "shots"...  ~  Labs 10/12 showed PSA= 2.93  DEGENERATIVE JOINT DISEASE (ICD-715.90)  Hx of POLYMYALGIA RHEUMATICA (ICD-725) - hasn't req steroids x yrs... ~  labs 2/10 showed Sed= 8 ~  labs 10/10 showed Sed= 6 ~  labs 3/11 showed Sed= 51 w/ acute illness. ~  labs 10/11 showed Sed= 9 ~  Labs 10/12 showed Sed= 9  TREMOR (ICD-781.0) - [facility]- essential tremor w/ +FamHx in sister... trial Clonazepam 0.65m Bid w/ improvement...  ANXIETY (ICD-300.00) - he uses the same Clonazepam 0.219mPrn...  Hx of SHINGLES  (ICD-053.9)  Thrombocytopenia >> serial labs w/ Plat counts betw 223 ==> 85; no bleeding diathesis, & he had acute intestinal vasc insuffic 2003 requiring embolectomy & has been on Coumadin Rx ever since then... ~    VITAMIN B12 DEFICIENCY (ICD-266.2) - on B 12 10003md OTC... ~  labs 2/10 showed B12 level = 215... rec start oral B12 supplement daily. ~  labs 10/10 showed B12 level = 416... continue oral B12 supplement. ~  labs 10/11 showed B12= 592... continue same. ~  Labs 10/12 showed B12= 573  VITAMIN D DEFICIENCY (ICD-268.9) - on Vit D 1000 u daily OTC... ~  labs 2/10 showed Vit D level = 23... rec> start Vit D 1000 u daily.. ~  Labs 10/12 showed Vit D level = 50   Past Surgical History  Procedure Date  . Coronary artery bypass graft 1994  . Laparoscopic cholecystectomy 2002  . Elap w/ superior mesenteric artery embolectomy 1/03    by Dr. LawJennette Banker Repair of incarcerated ventral hernia and lysis of adhesions 11/03    by Dr. HinBetsy Pries Implantation of biventric cardioverter-defibrillatorr     by Dr. TayLovena Le 8/06 [employer] [place]  . Implantation of guidant cognis device     model [id number] Left inguinal hernia repair with mesh 6/08    by Dr. HinBetsy Pries Outpatient Encounter Prescriptions as of 05/25/2011  Medication Sig Dispense Refill  . aspirin 81 MG tablet Take 81 mg by mouth daily.        . carvedilol (COREG) 6.25 MG tablet Take 6.25 mg by mouth 2 (two) times daily with a meal.      . cholecalciferol (VITAMIN D) 1000 UNITS tablet Take 1,000-2,000 Units by mouth daily.       . Cyanocobalamin (B-12) 1000 MCG CAPS Take 1 capsule by mouth daily.        . folic acid (FOLVITE) 1 MG tablet Take 1 mg by mouth daily.        .[name]  furosemide (LASIX) 40 MG tablet Take 40 mg by mouth daily.      . hydrocortisone (ANUSOL-HC) 2.5 % rectal cream Place 1 application rectally 2 (two) times daily.       [name] lisinopril (PRINIVIL,ZESTRIL) 10 MG tablet Take 1 tablet (10 mg  total) by mouth daily.  90 tablet  3  . mercaptopurine (PURINETHOL) 50 MG tablet Take 1.5 tabs by mouth once daily as directed by Dr. Su Ley      . phytonadione (MEPHYTON) 5 MG tablet Take 1/2 tablet stat  1 tablet  0  . simvastatin (ZOCOR) 20 MG tablet Take 1 tablet (20 mg total) by mouth at bedtime.  90 tablet  3  . spironolactone (ALDACTONE) 25 MG tablet Take 25 mg by mouth daily.      [name] warfarin (COUMADIN) 5 MG tablet TAKES 20MG ON Monday,WEDNESDAY AND Friday,AND TAKES 15MG Tuesday Thursday Saturday AND Sunday        No Known Allergies   Current Medications, Allergies, Past Medical History, Past Surgical History, Family History, and Social History were reviewed in [employer] record.    Review of Systems         See HPI - all other systems neg except as noted... The patient complains of decreased hearing and dyspnea on exertion.  The patient denies anorexia, fever, weight loss, weight gain, vision loss, hoarseness, chest pain, syncope, peripheral edema, prolonged cough, headaches, hemoptysis, abdominal pain, melena, hematochezia, severe indigestion/heartburn, hematuria, incontinence, muscle weakness, suspicious skin lesions, transient blindness, difficulty walking, depression, unusual weight change, abnormal bleeding, enlarged lymph nodes, and angioedema.     Objective:   Physical Exam     WD, WN, 78 y/o WM in NAD... GENERAL:  Alert & oriented; pleasant & cooperative... HEENT:  [place]/AT, EOM-wnl, PERRLA, EACs-clear, TMs-wnl, NOSE- sl red/ inflamm, THROAT-clear & wnl. NECK:  Supple w/ fairROM; no JVD; normal carotid impulses w/o bruits; no thyromegaly or nodules palpated; no lymphadenopathy. CHEST:  Decr BS w/ few scat rhonchi, no wheezing, no rales, no signs of consolidation. HEART:  Regular Rhythm; gr 1/6 SEM without rubs or gallops detected... ABDOMEN:  Soft & nontender; mult scars, & ventral hernia; normal bowel sounds; no organomegaly or masses  palpated. EXT: without deformities, mod arthritic changes; no varicose veins/ venous insuffic/ or edema. NEURO:  CN's intact; I do not apprec a tremor at present; no focal neuro deficits... DERM:  No lesions noted; no rash etc...  RADIOLOGY DATA:  Reviewed in the EPIC EMR & discussed w/ the patient...  LABORATORY DATA:  Reviewed in the EPIC EMR & discussed w/ the patient...   Assessment & Plan:   HBP>  BP controlled, continue same meds, and try to incr activity...  CAD/ Cardiomyopathy>  Followed by [name] & DrKlein, their notes are reviewed...  Hyperlipid>  On Simva20 + diet> continue same...  GI>  Followed [place] now by [facility] as DrChowdhury has moved to [facility]; continue meds & close f/u w/ GI...  Hx vasc insuffic intestines>  SMA embolectomy 2003, he remains on Coumadin via the CC...  Hx SBO requiring Hosp 3/13 but resolved w/ conserv approach per DrHIngram, [facility]...  Other medical problems as noted...   Patient's Medications  New Prescriptions   No medications on file  Previous Medications   ASPIRIN 81 MG TABLET    Take 81 mg by mouth daily.     CARVEDILOL (COREG) 6.25 MG TABLET    Take 6.25 mg by mouth 2 (two) times daily with a  meal.   CHOLECALCIFEROL (VITAMIN D) 1000 UNITS TABLET    Take 1,000-2,000 Units by mouth daily.    CYANOCOBALAMIN (B-12) 1000 MCG CAPS    Take 1 capsule by mouth daily.     FOLIC ACID (FOLVITE) 1 MG TABLET    Take 1 mg by mouth daily.     FUROSEMIDE (LASIX) 40 MG TABLET    Take 40 mg by mouth daily.   HYDROCORTISONE (ANUSOL-HC) 2.5 % RECTAL CREAM    Place 1 application rectally 2 (two) times daily.    LISINOPRIL (PRINIVIL,ZESTRIL) 10 MG TABLET    Take 1 tablet (10 mg total) by mouth daily.   MERCAPTOPURINE (PURINETHOL) 50 MG TABLET    Take 1.5 tabs by mouth once daily as directed by Dr. Su Ley   PHYTONADIONE (MEPHYTON) 5 MG TABLET    Take 1/2 tablet stat   SIMVASTATIN (ZOCOR) 20 MG TABLET    Take 1 tablet (20 mg total) by mouth at bedtime.    SPIRONOLACTONE (ALDACTONE) 25 MG TABLET    Take 25 mg by mouth daily.   WARFARIN (COUMADIN) 5 MG TABLET    TAKES 20MG ON [facility] AND Friday,AND TAKES 15MG Tuesday Thursday Saturday AND Sunday  Modified Medications   No medications on file  Discontinued Medications   No medications on file

## 2011-05-30 NOTE — Progress Notes (Signed)
Remote icd check  

## 2011-05-31 ENCOUNTER — Ambulatory Visit (INDEPENDENT_AMBULATORY_CARE_PROVIDER_SITE_OTHER): Payer: Medicare Other

## 2011-05-31 ENCOUNTER — Encounter: Payer: Self-pay | Admitting: *Deleted

## 2011-05-31 DIAGNOSIS — K55069 Acute infarction of intestine, part and extent unspecified: Secondary | ICD-10-CM

## 2011-05-31 DIAGNOSIS — K55059 Acute (reversible) ischemia of intestine, part and extent unspecified: Secondary | ICD-10-CM

## 2011-05-31 LAB — POCT INR: INR: 4.6

## 2011-06-07 ENCOUNTER — Ambulatory Visit (INDEPENDENT_AMBULATORY_CARE_PROVIDER_SITE_OTHER): Payer: Medicare Other | Admitting: *Deleted

## 2011-06-07 DIAGNOSIS — K55059 Acute (reversible) ischemia of intestine, part and extent unspecified: Secondary | ICD-10-CM

## 2011-06-07 DIAGNOSIS — K55069 Acute infarction of intestine, part and extent unspecified: Secondary | ICD-10-CM

## 2011-06-07 LAB — POCT INR: INR: 3.8

## 2011-06-16 DIAGNOSIS — K5090 Crohn's disease, unspecified, without complications: Secondary | ICD-10-CM | POA: Diagnosis not present

## 2011-06-17 ENCOUNTER — Ambulatory Visit (INDEPENDENT_AMBULATORY_CARE_PROVIDER_SITE_OTHER): Payer: Medicare Other

## 2011-06-17 DIAGNOSIS — K55069 Acute infarction of intestine, part and extent unspecified: Secondary | ICD-10-CM

## 2011-06-17 DIAGNOSIS — K55059 Acute (reversible) ischemia of intestine, part and extent unspecified: Secondary | ICD-10-CM

## 2011-06-17 LAB — POCT INR: INR: 5.1

## 2011-06-20 DIAGNOSIS — D72819 Decreased white blood cell count, unspecified: Secondary | ICD-10-CM | POA: Diagnosis not present

## 2011-06-20 DIAGNOSIS — K5090 Crohn's disease, unspecified, without complications: Secondary | ICD-10-CM | POA: Diagnosis not present

## 2011-06-20 DIAGNOSIS — D696 Thrombocytopenia, unspecified: Secondary | ICD-10-CM | POA: Diagnosis not present

## 2011-06-24 ENCOUNTER — Ambulatory Visit (INDEPENDENT_AMBULATORY_CARE_PROVIDER_SITE_OTHER): Payer: Medicare Other

## 2011-06-24 DIAGNOSIS — K55059 Acute (reversible) ischemia of intestine, part and extent unspecified: Secondary | ICD-10-CM

## 2011-06-24 DIAGNOSIS — K55069 Acute infarction of intestine, part and extent unspecified: Secondary | ICD-10-CM

## 2011-06-24 LAB — POCT INR: INR: 3.1

## 2011-07-05 ENCOUNTER — Ambulatory Visit (INDEPENDENT_AMBULATORY_CARE_PROVIDER_SITE_OTHER): Payer: Medicare Other

## 2011-07-05 DIAGNOSIS — K55059 Acute (reversible) ischemia of intestine, part and extent unspecified: Secondary | ICD-10-CM | POA: Diagnosis not present

## 2011-07-05 DIAGNOSIS — K55069 Acute infarction of intestine, part and extent unspecified: Secondary | ICD-10-CM

## 2011-07-05 LAB — POCT INR: INR: 2.9

## 2011-07-12 ENCOUNTER — Telehealth: Payer: Self-pay | Admitting: Cardiology

## 2011-07-12 NOTE — Telephone Encounter (Signed)
Okay to take Lasix twice a day for 3 or 4 days

## 2011-07-12 NOTE — Telephone Encounter (Signed)
Pt has edema in legs and feet should they increase fluid pills

## 2011-07-12 NOTE — Telephone Encounter (Signed)
Pt reporting increased swelling mostly in left leg the past 3-4 days.  Swelling from foot to mid lower leg.  Swelling goes down at night.  No increased sob.  No weight gain.  No dizziness.  Currently taking Lasix 39m daily.  He wants to know if he can take an extra lasix?

## 2011-07-12 NOTE — Telephone Encounter (Signed)
Mrs Bannister was notified and agrees to call back and let us know how he is doing after taking the extra lasix.

## 2011-07-15 ENCOUNTER — Telehealth: Payer: Self-pay | Admitting: Cardiology

## 2011-07-15 NOTE — Telephone Encounter (Signed)
Mr Sees reports the swelling is better, but not gone.  It is only in his feet and ankle.   He is still having pain in one leg but thinks it might be his crohns since that makes his feet ache.

## 2011-07-15 NOTE — Telephone Encounter (Signed)
Yes the furosemide has worked on reducing swelling and where would they go from here

## 2011-07-15 NOTE — Telephone Encounter (Signed)
Pt to continue the lasix twice daily through the weekend and call back on Monday with a progress report.  Mrs Heckmann was notified.

## 2011-07-18 DIAGNOSIS — D72819 Decreased white blood cell count, unspecified: Secondary | ICD-10-CM | POA: Diagnosis not present

## 2011-07-18 DIAGNOSIS — D696 Thrombocytopenia, unspecified: Secondary | ICD-10-CM | POA: Diagnosis not present

## 2011-07-18 DIAGNOSIS — K5090 Crohn's disease, unspecified, without complications: Secondary | ICD-10-CM | POA: Diagnosis not present

## 2011-07-19 ENCOUNTER — Ambulatory Visit (INDEPENDENT_AMBULATORY_CARE_PROVIDER_SITE_OTHER): Payer: Medicare Other | Admitting: Pharmacist

## 2011-07-19 DIAGNOSIS — K55059 Acute (reversible) ischemia of intestine, part and extent unspecified: Secondary | ICD-10-CM | POA: Diagnosis not present

## 2011-07-19 DIAGNOSIS — K55069 Acute infarction of intestine, part and extent unspecified: Secondary | ICD-10-CM

## 2011-07-19 LAB — POCT INR: INR: 2

## 2011-08-08 DIAGNOSIS — D72819 Decreased white blood cell count, unspecified: Secondary | ICD-10-CM | POA: Diagnosis not present

## 2011-08-08 DIAGNOSIS — D696 Thrombocytopenia, unspecified: Secondary | ICD-10-CM | POA: Diagnosis not present

## 2011-08-08 DIAGNOSIS — K5090 Crohn's disease, unspecified, without complications: Secondary | ICD-10-CM | POA: Diagnosis not present

## 2011-08-08 DIAGNOSIS — Z7901 Long term (current) use of anticoagulants: Secondary | ICD-10-CM | POA: Diagnosis not present

## 2011-08-08 DIAGNOSIS — D649 Anemia, unspecified: Secondary | ICD-10-CM | POA: Diagnosis not present

## 2011-08-15 ENCOUNTER — Ambulatory Visit (INDEPENDENT_AMBULATORY_CARE_PROVIDER_SITE_OTHER): Payer: Medicare Other | Admitting: Internal Medicine

## 2011-08-15 ENCOUNTER — Encounter: Payer: Self-pay | Admitting: Internal Medicine

## 2011-08-15 ENCOUNTER — Ambulatory Visit (INDEPENDENT_AMBULATORY_CARE_PROVIDER_SITE_OTHER): Payer: Medicare Other

## 2011-08-15 VITALS — BP 123/74 | HR 60 | Ht 72.0 in | Wt 191.0 lb

## 2011-08-15 DIAGNOSIS — K55069 Acute infarction of intestine, part and extent unspecified: Secondary | ICD-10-CM

## 2011-08-15 DIAGNOSIS — I498 Other specified cardiac arrhythmias: Secondary | ICD-10-CM | POA: Diagnosis not present

## 2011-08-15 DIAGNOSIS — Z95810 Presence of automatic (implantable) cardiac defibrillator: Secondary | ICD-10-CM | POA: Diagnosis not present

## 2011-08-15 DIAGNOSIS — R001 Bradycardia, unspecified: Secondary | ICD-10-CM

## 2011-08-15 DIAGNOSIS — I2589 Other forms of chronic ischemic heart disease: Secondary | ICD-10-CM | POA: Diagnosis not present

## 2011-08-15 DIAGNOSIS — R5383 Other fatigue: Secondary | ICD-10-CM | POA: Diagnosis not present

## 2011-08-15 DIAGNOSIS — K55059 Acute (reversible) ischemia of intestine, part and extent unspecified: Secondary | ICD-10-CM | POA: Diagnosis not present

## 2011-08-15 DIAGNOSIS — I5022 Chronic systolic (congestive) heart failure: Secondary | ICD-10-CM | POA: Diagnosis not present

## 2011-08-15 DIAGNOSIS — R5381 Other malaise: Secondary | ICD-10-CM

## 2011-08-15 DIAGNOSIS — I255 Ischemic cardiomyopathy: Secondary | ICD-10-CM

## 2011-08-15 LAB — ICD DEVICE OBSERVATION
AL IMPEDENCE ICD: 449 Ohm
AL THRESHOLD: 1 V
LV LEAD AMPLITUDE: 25 mv
LV LEAD IMPEDENCE ICD: 405 Ohm
LV LEAD THRESHOLD: 0.7 V
RV LEAD AMPLITUDE: 20.1 mv

## 2011-08-15 LAB — BASIC METABOLIC PANEL
CO2: 30 mEq/L (ref 19–32)
Calcium: 9.6 mg/dL (ref 8.4–10.5)
Creatinine, Ser: 0.9 mg/dL (ref 0.4–1.5)
GFR: 84.52 mL/min (ref >60.00)
Glucose, Bld: 98 mg/dL (ref 70–99)
Sodium: 139 mEq/L (ref 135–145)

## 2011-08-15 MED ORDER — FUROSEMIDE 40 MG PO TABS: ORAL_TABLET | ORAL | Status: DC

## 2011-08-15 NOTE — Assessment & Plan Note (Signed)
He has a complaint of the major change in his fatigue. He snores significantly. I think we want to exclude sleep apnea. His hematological profile has been followed closely.

## 2011-08-15 NOTE — Assessment & Plan Note (Signed)
The patient's device was interrogated.  The information was reviewed. No changes were made in the programming.    

## 2011-08-15 NOTE — Assessment & Plan Note (Signed)
He has clear evidence of volume overload which was not evident when he saw Dr. [name] in February. We will check his metabolic profile today to increase his diuretics which Dr. [name] and on for brief period of time earlier this spring in response to the edema.  We'll do it for about 4 weeks alternating 80/40 and have him follow up with one of the PAs

## 2011-08-15 NOTE — Patient Instructions (Addendum)
Your physician has recommended you make the following change in your medication:  1) Take lasix 40 mg two pills every other day alternating with one pill every other day.  Your physician recommends that you have lab work today: bmp  Your physician has recommended that you have a sleep study. This test records several body functions during sleep, including: brain activity, eye movement, oxygen and carbon dioxide blood levels, heart rate and rhythm, breathing rate and rhythm, the flow of air through your mouth and nose, snoring, body muscle movements, and chest and belly movement.  Your physician recommends that you schedule a follow-up appointment in: 4 weeks with the PA.  Remote monitoring is used to monitor your Pacemaker of ICD from home. This monitoring reduces the number of office visits required to check your device to one time per year. It allows [place] to keep an eye on the functioning of your device to ensure it is working properly. You are scheduled for a device check from home on 11/24/11. You may send your transmission at any time that day. If you have a wireless device, the transmission will be sent automatically. After your physician reviews your transmission, you will receive a postcard with your next transmission date.

## 2011-08-15 NOTE — Progress Notes (Signed)
HPI  [name] is a 78 y.o. male ICD implanted for primary prevention in this setting of ischemic coronary disease with prior CABG as part of the MADIT-CRT trial. His last catheterization in 2006 demonstrated a total right and 80% marginal. Vein graft to the diagonal was occluded and other bypasses were open; ejection fraction 2007 was about 35% Nuclear scan Oct 2011>>scar no ischemia  The patient denies SOB, chest pain,   Significant peripheral edema which has been a problem over the last couple of months and significant fatigue. He does not have exercise intolerance but this does not feel like he gets started. He does snore and has obstructive sleeping patterns as well as daytime somnolence   Past Medical History  Diagnosis Date  . Dizziness     Positional dizziness  . Hypertension   . Ischemic cardiomyopathy     CABG 1994 CAD catherization 1996.[name] occluded vein graft to the diagnol, other grafts patent/  catherization 2006, no PCI/ nuclear.[name] october 2011.[name] extensive scar anteroseptal and apical.. no ischemia    . CHF (congestive heart failure)     chronic systolic  . Mitral regurgitation     mild.[name] echo november 2011  EF 35%...echo.. november 2009/ ef 35%.[name] echo november 2011  . Sinus bradycardia   . Chronotropic incompetence   . Biventricular implantable cardiac defibrillator in situ     GDT CONTAK H170-MADIT-CRT-EXPLANTED 2011 implanted defibrillator- Guidant cognis, model n119-2001 Dr. Caryl Comes (11/28/2009)  . LBBB (left bundle branch block)   . Hyperlipemia   . GERD (gastroesophageal reflux disease)   . Colonic polyp   . Crohn's disease   . Acute vascular insufficiency of intestine     Mesenteric embolus...surgery 2003  . Ventral hernia   . Hypertrophy of prostate with urinary obstruction and other lower urinary tract symptoms (LUTS)   . Degenerative joint disease   . Polymyalgia rheumatica   . Tremor   . Anxiety   . Shingles   . Vitamin B12 deficiency   . CAD  (coronary artery disease)     CABG 1994  /   catheterization 1996, occluded vein graft to the diagonal, other grafts patent  /   catheterization 2006, no PCI  /  nuclear, October, 2011, extensive scar anteroseptal and apical, no ischemia  . Hx of CABG     1994  . Ejection fraction     EF 35%, echo, November, 2011  . Warfarin anticoagulation     Coumadin therapy for mesenteric embolus 2003  . Carotid artery disease     Doppler, February,  2012, 0-39% bilateral, recommended followup 1 year    Past Surgical History  Procedure Date  . Coronary artery bypass graft 1994  . Laparoscopic cholecystectomy 2002  . Elap w/ superior mesenteric artery embolectomy 1/03    by Dr. Jennette Banker  . Repair of incarcerated ventral hernia and lysis of adhesions 11/03    by Dr. Betsy Pries  . Implantation of biventric cardioverter-defibrillatorr     by Dr. Lovena Le in 8/06 [employer] [place] 2011  . Implantation of guidant cognis device     model [id number]  . Left inguinal hernia repair with mesh 6/08    by Dr. Betsy Pries    Current Outpatient Prescriptions  Medication Sig Dispense Refill  . aspirin 81 MG tablet Take 81 mg by mouth daily.        . carvedilol (COREG) 6.25 MG tablet Take 6.25 mg by mouth 2 (two) times daily with a meal.      .  cholecalciferol (VITAMIN D) 1000 UNITS tablet Take 1,000-2,000 Units by mouth daily.       . Cyanocobalamin (B-12) 1000 MCG CAPS Take 1 capsule by mouth daily.        . folic acid (FOLVITE) 1 MG tablet Take 1 mg by mouth daily.        . furosemide (LASIX) 40 MG tablet Take 40 mg by mouth daily.      . hydrocortisone (ANUSOL-HC) 2.5 % rectal cream Place 1 application rectally 2 (two) times daily.       [name] lisinopril (PRINIVIL,ZESTRIL) 10 MG tablet Take 1 tablet (10 mg total) by mouth daily.  90 tablet  3  . mercaptopurine (PURINETHOL) 50 MG tablet Take 1.5 tabs by mouth once daily as directed by Dr. Su Ley      . phytonadione (MEPHYTON) 5 MG tablet Take 1/2 tablet  stat  1 tablet  0  . simvastatin (ZOCOR) 20 MG tablet Take 1 tablet (20 mg total) by mouth at bedtime.  90 tablet  3  . spironolactone (ALDACTONE) 25 MG tablet Take 25 mg by mouth daily.      [name] warfarin (COUMADIN) 5 MG tablet TAKES 20MG ON [facility] AND Friday,AND TAKES 15MG Tuesday Thursday Saturday AND Sunday        No Known Allergies  Review of Systems negative except from HPI and PMH  Physical Exam BP 123/74  Pulse 60  Ht 6' (1.829 m)  Wt 191 lb (86.637 kg)  BMI 25.90 kg/m2 Well developed and well nourished in no acute distress HENT normal E scleral and icterus clear Neck Supple JVP flat; carotids brisk and full Clear to ausculation Regular rate and rhythm, no murmurs gallops or rub Soft with active bowel sounds No clubbing cyanosis 3+ Edema Alert and oriented, grossly normal motor and sensory function Skin Warm and Dry    Assessment and  Plan

## 2011-08-18 ENCOUNTER — Telehealth: Payer: Self-pay | Admitting: Cardiology

## 2011-08-18 NOTE — Telephone Encounter (Signed)
Will forward to [employer].

## 2011-08-18 NOTE — Telephone Encounter (Signed)
Pt is scheduled for implants on August 12th.  Local anesthetic.  They want him off the coumadin for 5 days prior.  They want an ok for all this.

## 2011-08-18 NOTE — Telephone Encounter (Signed)
New problem:  Per August calling need cardiac clearance for dental work up.

## 2011-08-19 NOTE — Telephone Encounter (Signed)
Dental office is closed until next week.

## 2011-08-19 NOTE — Telephone Encounter (Signed)
He would be optimal not to stop the patient's Coumadin for dental implants. However if this is needed, he needs to be bridged with Lovenox.

## 2011-08-23 NOTE — Telephone Encounter (Signed)
Dental office was notified.

## 2011-08-23 NOTE — Telephone Encounter (Signed)
F/u  Per August calling, waiting on cardiac clearance. Aware that nurse Debbie did contact office was closed until next week.

## 2011-08-29 ENCOUNTER — Telehealth: Payer: Self-pay | Admitting: Cardiology

## 2011-08-29 NOTE — Telephone Encounter (Signed)
August was rtning your call

## 2011-08-29 NOTE — Telephone Encounter (Signed)
Left message to call.  All lines were busy according to the voicemail.

## 2011-08-29 NOTE — Telephone Encounter (Signed)
Dr Sherryle Lis will call back and let us know how many days [name] needs to hold his coumadin or what he wants his INR to be prior to the teeth removal.  Pt is scheduled to have two teeth removed on 09/12/11.

## 2011-08-29 NOTE — Telephone Encounter (Signed)
F/u   Per August please call Dr. Sherryle Lis regarding patient

## 2011-08-29 NOTE — Telephone Encounter (Signed)
Per Dr Sherryle Lis, pt needs to be off his coumadin for the teeth to be extracted.  Dr Sherryle Lis states it is up to cardiology to decide the length of time he needs to be off his coumadin.

## 2011-08-30 ENCOUNTER — Ambulatory Visit (HOSPITAL_BASED_OUTPATIENT_CLINIC_OR_DEPARTMENT_OTHER): Payer: Medicare Other | Attending: Internal Medicine | Admitting: Radiology

## 2011-08-30 VITALS — Ht 72.0 in | Wt 195.0 lb

## 2011-08-30 DIAGNOSIS — G4733 Obstructive sleep apnea (adult) (pediatric): Secondary | ICD-10-CM | POA: Insufficient documentation

## 2011-08-30 DIAGNOSIS — Z95810 Presence of automatic (implantable) cardiac defibrillator: Secondary | ICD-10-CM | POA: Insufficient documentation

## 2011-08-30 DIAGNOSIS — R5381 Other malaise: Secondary | ICD-10-CM

## 2011-08-30 DIAGNOSIS — R5383 Other fatigue: Secondary | ICD-10-CM

## 2011-08-30 DIAGNOSIS — I4949 Other premature depolarization: Secondary | ICD-10-CM | POA: Diagnosis not present

## 2011-08-31 NOTE — Telephone Encounter (Signed)
Message transferred to the [facility] so that they can tell pt what to do about stopping coumadin and starting lovenox injections prior to tooth removal on 09/12/11.

## 2011-08-31 NOTE — Telephone Encounter (Signed)
F/U   Per August calling INR 2.5 or below.

## 2011-09-01 NOTE — Telephone Encounter (Signed)
Spoke with wife and pt is pending extraction on 09/12/11. Pt doesn't have to hold coumadin as long as INR is less than 2.5 per dentist. But if coumadin is held Dr [name] instructed pt to be bridged with Lovenox. Pt has appt on 09/05/11 and we will adjust dose accordingly. Also s/c appt on  09/12/11 for INR day of extraction.

## 2011-09-03 DIAGNOSIS — I4949 Other premature depolarization: Secondary | ICD-10-CM

## 2011-09-03 DIAGNOSIS — G4733 Obstructive sleep apnea (adult) (pediatric): Secondary | ICD-10-CM

## 2011-09-03 NOTE — Procedures (Signed)
NAMENICKOLAS, CHALFIN NO.:  [id number]  MEDICAL RECORD NO.:  [id number]          PATIENT TYPE:  OUT  LOCATION:  SLEEP CENTER                 FACILITY:  [facility]  PHYSICIAN:  Kathee Delton, MD,FCCPDATE OF BIRTH:  04-01-33  DATE OF STUDY:  08/30/2011                           NOCTURNAL POLYSOMNOGRAM  REFERRING PHYSICIAN:  Deboraha Sprang, MD, [facility]  LOCATION:  Sleep lab.  REFERRING PHYSICIAN:  Deboraha Sprang, MD, [facility]  INDICATION FOR STUDY:  Hypersomnia with sleep apnea.  EPWORTH SLEEPINESS SCORE:  3.  MEDICATIONS:  SLEEP ARCHITECTURE:  The patient had a total sleep time of 302 minutes with very little slow-wave sleep and REM.  Sleep onset latency was prolonged at 62 minutes, and REM onset was very prolonged at 232 minutes.  Sleep efficiency was moderately reduced at 72%.  RESPIRATORY DATA:  The patient was found to have 45 apneas and 127 obstructive hypopneas, giving him an apnea-hypopnea index of 34 events per hour.  The events were much more prominent in the supine position, and were more severe during REM.  Moderate snoring was noted throughout. The patient did not meet split night protocol secondary to the majority of his events occurring after 2 a.m.  OXYGEN DATA:  There was oxygen desaturation as low as 67% with the patient's obstructive events.  CARDIAC DATA:  The patient was noted to have a biventricular defibrillator, with occasional PVCs.  MOVEMENT-PARASOMNIA:  The patient had no significant leg jerks or other abnormal behaviors noted.  IMPRESSIONS-RECOMMENDATIONS: 1. Moderate-to-severe obstructive sleep apnea/hypopnea syndrome with     an AHI of 34 events per hour and oxygen desaturation as low as 67%.     Treatment for this degree of sleep apnea should focus primarily on CPAP, as well as modest weight loss. 2. The patient was noted to have a biventricular defibrillator, with     occasional PVCs noted during the study.     Kathee Delton, MD,FCCP Diplomate, [place] Board of Sleep Medicine    [facility]/MEDQ  D:  09/03/2011 16:12:34  T:  09/03/2011 21:50:18  Job:  [id number]

## 2011-09-05 ENCOUNTER — Ambulatory Visit (INDEPENDENT_AMBULATORY_CARE_PROVIDER_SITE_OTHER): Payer: Medicare Other | Admitting: Pharmacist

## 2011-09-05 DIAGNOSIS — K55059 Acute (reversible) ischemia of intestine, part and extent unspecified: Secondary | ICD-10-CM | POA: Diagnosis not present

## 2011-09-05 DIAGNOSIS — K55069 Acute infarction of intestine, part and extent unspecified: Secondary | ICD-10-CM

## 2011-09-05 LAB — POCT INR: INR: 2.9

## 2011-09-05 NOTE — Patient Instructions (Signed)
Patient will be having 2 teeth extracted and dental implants on Monday 8-12.  Pt has appointment this Wednesday with dentist and has been instructed to call coumadin clinic about holding coumadin for procedure.

## 2011-09-08 ENCOUNTER — Telehealth: Payer: Self-pay | Admitting: Cardiology

## 2011-09-08 NOTE — Telephone Encounter (Signed)
Call received from Dr Roxy Manns office August is requesting that we call her 8/12 with Mr [name] result. He is scheduled for dental extractions and implant on the 8/12 at 11am if his INR result is 2.5 or less.  Phone number to report result is [phone]. If the pts result is greater than 2.5 on this visit the pt will need to having Lovenox bridging as per Dr [name]. August from Dr. Roxy Manns office asks that we call this result so that she can set up bridging according to Dr Roxy Manns schedule.   Left message for the pt to call the coumadin clinic and be advised. The pt needs to take 7.62m of coumadin on Saturday 8/10 and be sure to have a serving of greens on Saturday and Sunday.

## 2011-09-12 ENCOUNTER — Encounter: Payer: Medicare Other | Admitting: Physician Assistant

## 2011-09-12 ENCOUNTER — Ambulatory Visit (INDEPENDENT_AMBULATORY_CARE_PROVIDER_SITE_OTHER): Payer: Medicare Other | Admitting: Pharmacist

## 2011-09-12 ENCOUNTER — Telehealth: Payer: Self-pay | Admitting: Pulmonary Disease

## 2011-09-12 DIAGNOSIS — K55069 Acute infarction of intestine, part and extent unspecified: Secondary | ICD-10-CM

## 2011-09-12 DIAGNOSIS — K55059 Acute (reversible) ischemia of intestine, part and extent unspecified: Secondary | ICD-10-CM | POA: Diagnosis not present

## 2011-09-12 NOTE — Telephone Encounter (Signed)
Dr. Gwenette Greet spoke with Dr. Caryl Comes and pt needs a sleep consult with [facility] within 2-3 weeks. I have blocked time for Tues., 09/13/11 @ 3pm and 4:15pm to get pt worked in to his schedule. I have left a msg for the pt to call Julyan Gales back. Will try to work something else out for the pt if this date and time is not good for him since this short notice.

## 2011-09-13 ENCOUNTER — Telehealth: Payer: Self-pay | Admitting: *Deleted

## 2011-09-13 NOTE — Telephone Encounter (Signed)
appt scheduled for Friday 09/16/11 at 4:30

## 2011-09-13 NOTE — Telephone Encounter (Signed)
Pulmonary called the patient and he is scheduled for 8/16 for evaluation of sleep apnea.

## 2011-09-13 NOTE — Telephone Encounter (Signed)
Message     That would be great Can u arrange   ----- Message -----   From: Kathee Delton, MD   Sent: 09/05/2011 8:57 AM   To: Deboraha Sprang, MD      Richardson Landry, just wanted to let you know this pt's sleep study showed moderate to severe osa with AHI 34/hr. We will be happy to see for management.

## 2011-09-15 ENCOUNTER — Encounter: Payer: Self-pay | Admitting: Physician Assistant

## 2011-09-15 ENCOUNTER — Ambulatory Visit (INDEPENDENT_AMBULATORY_CARE_PROVIDER_SITE_OTHER): Payer: Medicare Other | Admitting: Physician Assistant

## 2011-09-15 VITALS — BP 124/72 | HR 64 | Ht 66.0 in | Wt 193.0 lb

## 2011-09-15 DIAGNOSIS — I2510 Atherosclerotic heart disease of native coronary artery without angina pectoris: Secondary | ICD-10-CM

## 2011-09-15 DIAGNOSIS — R0602 Shortness of breath: Secondary | ICD-10-CM

## 2011-09-15 DIAGNOSIS — I2589 Other forms of chronic ischemic heart disease: Secondary | ICD-10-CM | POA: Diagnosis not present

## 2011-09-15 DIAGNOSIS — I10 Essential (primary) hypertension: Secondary | ICD-10-CM | POA: Diagnosis not present

## 2011-09-15 DIAGNOSIS — I5022 Chronic systolic (congestive) heart failure: Secondary | ICD-10-CM

## 2011-09-15 DIAGNOSIS — R609 Edema, unspecified: Secondary | ICD-10-CM

## 2011-09-15 DIAGNOSIS — Z95810 Presence of automatic (implantable) cardiac defibrillator: Secondary | ICD-10-CM

## 2011-09-15 DIAGNOSIS — I255 Ischemic cardiomyopathy: Secondary | ICD-10-CM

## 2011-09-15 LAB — CBC WITH DIFFERENTIAL/PLATELET
Basophils Relative: 0.2 % (ref 0.0–3.0)
Eosinophils Relative: 2.5 % (ref 0.0–5.0)
HCT: 37.1 % — ABNORMAL LOW (ref 39.0–52.0)
Hemoglobin: 12.5 g/dL — ABNORMAL LOW (ref 13.0–17.0)
Lymphs Abs: 1.5 10*3/uL (ref 0.7–4.0)
MCV: 106.3 fl — ABNORMAL HIGH (ref 78.0–100.0)
Monocytes Absolute: 0.5 10*3/uL (ref 0.1–1.0)
Monocytes Relative: 14.1 % — ABNORMAL HIGH (ref 3.0–12.0)
Neutro Abs: 1.4 10*3/uL (ref 1.4–7.7)
Platelets: 67 10*3/uL — ABNORMAL LOW (ref 150.0–400.0)
RBC: 3.49 Mil/uL — ABNORMAL LOW (ref 4.22–5.81)
WBC: 3.5 10*3/uL — ABNORMAL LOW (ref 4.5–10.5)

## 2011-09-15 LAB — BASIC METABOLIC PANEL
BUN: 12 mg/dL (ref 6–23)
Chloride: 104 mEq/L (ref 96–112)
GFR: 135.77 mL/min (ref >60.00)
Potassium: 4.1 mEq/L (ref 3.5–5.1)
Sodium: 138 mEq/L (ref 135–145)

## 2011-09-15 LAB — HEPATIC FUNCTION PANEL
ALT: 25 U/L (ref 0–53)
AST: 38 U/L — ABNORMAL HIGH (ref 0–37)
Alkaline Phosphatase: 99 U/L (ref 39–117)
Bilirubin, Direct: 0.3 mg/dL (ref 0.0–0.3)
Total Bilirubin: 1.7 mg/dL — ABNORMAL HIGH (ref 0.3–1.2)
Total Protein: 5.8 g/dL — ABNORMAL LOW (ref 6.0–8.3)

## 2011-09-15 MED ORDER — FUROSEMIDE 40 MG PO TABS: 60.0000 mg | ORAL_TABLET | Freq: Every day | ORAL | Status: DC

## 2011-09-15 NOTE — Progress Notes (Signed)
[place], [place]  [address] Phone: [phone] Fax:  [phone]  Date:  09/15/2011   Name:  [name]   DOB:  1931-11-28   MRN:  [id number]  PCP:  [name] Space, MD  Primary Cardiologist:  Dr. Cleatis [name]  Primary Electrophysiologist:  Dr. Virl [name]    History of Present Illness: [name] is a 78 y.o. male who returns for 4 week follow up.  He has a history of CAD, s/p CABG in 1994, ICM EF 35%, status post CRT-D, sinus bradycardia, prior mesenteric thrombosis, chronic Coumadin therapy, Crohn's disease, HTN, HL, systolic CHF, mitral regurgitation, LBBB, PMR, carotid stenosis. Admitted to [facility] 3/13 for SBO.  Last LHC 2006: Occluded vein graft to diagonal, other grafts patent.   Last nuclear study 10/2009: Extensive scar anteroseptal and apical walls, no ischemia.   Last echo 12/2009: EF 35%, mild MR.   Carotid Dopplers 03/2010: 0-39% bilateral.  Saw Dr. Virl [name] 7/13. He was noted to have LE edema. He was placed on a higher dose of Lasix and brought back today for followup. His edema is asymmetric. Left leg is greater than the right. This is a fairly new symptom for him. It is not improving with a higher dose of Lasix. He denies worsening DOE.  He is probably class II-IIb.  No orthopnea, PND.  No syncope.  No chest pain.  Denies salt indiscretion.  No cough or fevers.   Wt Readings from Last 3 Encounters:  09/15/11 193 lb (87.544 kg)  08/30/11 195 lb (88.451 kg)  08/15/11 191 lb (86.637 kg)     Past Medical History  Diagnosis Date  . Dizziness     Positional dizziness  . Hypertension   . Ischemic cardiomyopathy     CABG 1994 CAD catherization 1996.[name] occluded vein graft to the diagnol, other grafts patent/  catherization 2006, no PCI/ nuclear.[name] october 2011.[name] extensive scar anteroseptal and apical.. no ischemia    . CHF (congestive heart failure)     chronic systolic  . Mitral regurgitation     mild.[name] echo november  2011  EF 35%...echo.. november 2009/ ef 35%.[name] echo november 2011  . Sinus bradycardia   . Chronotropic incompetence   . Biventricular implantable cardiac defibrillator in situ     GDT CONTAK H170-MADIT-CRT-EXPLANTED 2011 implanted defibrillator- Guidant cognis, model n119-2001 Dr. Caryl [name] (11/28/2009)  . LBBB (left bundle branch block)   . Hyperlipemia   . GERD (gastroesophageal reflux disease)   . Colonic polyp   . Crohn's disease   . Acute vascular insufficiency of intestine     Mesenteric embolus...surgery 2003  . Ventral hernia   . Hypertrophy of prostate with urinary obstruction and other lower urinary tract symptoms (LUTS)   . Degenerative joint disease   . Polymyalgia rheumatica   . Tremor   . Anxiety   . Shingles   . Vitamin B12 deficiency   . CAD (coronary artery disease)     CABG 1994  /   catheterization 1996, occluded vein graft to the diagonal, other grafts patent  /   catheterization 2006, no PCI  /  nuclear, October, 2011, extensive scar anteroseptal and apical, no ischemia  . Hx of CABG     1994  . Ejection fraction     EF 35%, echo, November, 2011  . Warfarin anticoagulation     Coumadin therapy for mesenteric embolus 2003  . Carotid artery disease     Doppler, February,  2012, 0-39% bilateral, recommended followup 1 year    Current Outpatient Prescriptions  Medication Sig Dispense Refill  . aspirin 81 MG tablet Take 81 mg by mouth daily.        . carvedilol (COREG) 6.25 MG tablet Take 6.25 mg by mouth 2 (two) times daily with a meal.      . cholecalciferol (VITAMIN D) 1000 UNITS tablet Take 1,000-2,000 Units by mouth daily.       . Cyanocobalamin (B-12) 1000 MCG CAPS Take 1 capsule by mouth daily.        . folic acid (FOLVITE) 1 MG tablet Take 1 mg by mouth daily.        . furosemide (LASIX) 40 MG tablet Take two pills every other day alternating with one pill every other day  135 tablet  3  . hydrocortisone (ANUSOL-HC) 2.5 % rectal cream Place 1 application  rectally 2 (two) times daily.       [name] lisinopril (PRINIVIL,ZESTRIL) 10 MG tablet Take 1 tablet (10 mg total) by mouth daily.  90 tablet  3  . mercaptopurine (PURINETHOL) 50 MG tablet Take 1.5 tabs by mouth once daily as directed by Dr. Su [name]      . phytonadione (MEPHYTON) 5 MG tablet Take 1/2 tablet stat  1 tablet  0  . simvastatin (ZOCOR) 20 MG tablet Take 1 tablet (20 mg total) by mouth at bedtime.  90 tablet  3  . spironolactone (ALDACTONE) 25 MG tablet Take 25 mg by mouth daily.      [name] warfarin (COUMADIN) 5 MG tablet TAKES 20MG ON Monday,WEDNESDAY AND Friday,AND TAKES 15MG Tuesday Thursday Saturday AND Sunday        Allergies: No Known Allergies  History  Substance Use Topics  . Smoking status: Former Smoker -- 1.0 packs/day for 20 years    Types: Cigarettes    Quit date: 01/31/1973  . Smokeless tobacco: Never Used  . Alcohol Use: Yes     occasional     ROS:  Please see the history of present illness.     All other systems reviewed and negative.   PHYSICAL EXAM: VS:  BP 124/72  Pulse 64  Ht 5' 6"  (1.676 m)  Wt 193 lb (87.544 kg)  BMI 31.15 kg/m2 Well nourished, well developed, in no acute distress HEENT: normal Neck: no JVD Cardiac:  normal S1, S2; RRR; no murmur Lungs:  clear to auscultation bilaterally, no wheezing, rhonchi or rales Abd: soft, nontender, no hepatomegaly Ext: 1+ bilateral LE edema, L>R, calves soft non-tender, no palp cords Skin: warm and dry Neuro:  CNs 2-12 intact, no focal abnormalities noted  EKG:  Ventricular paced, heart rate 64      ASSESSMENT AND PLAN:  1. Edema He does not appear to have symptoms of worsening congestive heart failure. His LE edema is fairly persistent. He had mildly reduced RV function at his last echo in 2011. He does have a 30+ pack year history of smoking. However, he denies significant worsening dyspnea.  He was recently diagnosed with sleep apnea. He also has a history of mesenteric thrombosis and is on chronic  Coumadin. His most recent INR is therapeutic. It would be quite unusual for him to have a DVT contributing to his swelling. In any event, I will arrange bilateral venous Dopplers to be complete. Arrange repeat echocardiogram. I'll also check CBC, TSH and LFTs. Continue Lasix 60 mg twice a day. Repeat a basic metabolic panel today. He'll keep his legs elevated as  much as possible.  2.  Chronic Systolic CHF Volume overall appears stable. Check a basic metabolic panel and BNP today. Followup with Dr. [name] in 2 months.  3. Coronary Artery Disease Stable. No angina. Continue aspirin and statin.  4. Hypertension Controlled.  5. Sleep Apnea Recent sleep test demonstrates moderate to severe sleep apnea. He will see Dr. Gwenette Greet soon for evaluation.  SignedRichardson Dopp, PA-C  11:59 AM 09/15/2011

## 2011-09-15 NOTE — Patient Instructions (Addendum)
Your physician recommends that you schedule a follow-up appointment in: 2 MONTHS WITH DR. KATZ   Your physician has requested that you have an echocardiogram DX HEART FAILURE. Echocardiography is a painless test that uses sound waves to create images of your heart. It provides your doctor with information about the size and shape of your heart and how well your heart's chambers and valves are working. This procedure takes approximately one hour. There are no restrictions for this procedure.   Your physician has requested that you have a BILATERAL lower extremity venous duplex DX EDEMA. This test is an ultrasound of the veins in the legs or arms. It looks at venous blood flow that carries blood from the heart to the legs or arms. Allow one hour for a Lower Venous exam. Allow thirty minutes for an Upper Venous exam. There are no restrictions or special instructions.  KEEP LEGS ELEAVTED  CHANGE LASIX TO 60 MG DAILY

## 2011-09-16 ENCOUNTER — Ambulatory Visit (INDEPENDENT_AMBULATORY_CARE_PROVIDER_SITE_OTHER): Payer: Medicare Other | Admitting: Pulmonary Disease

## 2011-09-16 ENCOUNTER — Encounter: Payer: Self-pay | Admitting: Pulmonary Disease

## 2011-09-16 ENCOUNTER — Telehealth: Payer: Self-pay | Admitting: *Deleted

## 2011-09-16 VITALS — BP 102/66 | HR 60 | Temp 98.3°F | Ht 72.0 in | Wt 191.4 lb

## 2011-09-16 DIAGNOSIS — G4733 Obstructive sleep apnea (adult) (pediatric): Secondary | ICD-10-CM

## 2011-09-16 HISTORY — DX: Obstructive sleep apnea (adult) (pediatric): G47.33

## 2011-09-16 NOTE — Patient Instructions (Addendum)
Will start on cpap for your significant sleep apnea. Please call me if you are having issues with tolerance. followup with me in 6 weeks.

## 2011-09-16 NOTE — Telephone Encounter (Signed)
s/w pt's wife due to pt hard of hearing. she is aware of lab results and to have pt drink 1-2 ensure daily. I will fax results to hematologist today

## 2011-09-16 NOTE — Telephone Encounter (Signed)
Message copied by Michae Kava on Fri Sep 16, 2011  9:40 AM ------      Message from: [place], [place] T      Created: Thu Sep 15, 2011  5:38 PM       hgb stable      plt count stable        -  Fax copy of labs to hematologist      Protein levels low normal        -  Add Ensure one or 2 times a day      Otherwise, Continue with current treatment plan.      Richardson Dopp, PA-C  5:38 PM 09/15/2011

## 2011-09-16 NOTE — Assessment & Plan Note (Signed)
The patient has moderate to severe obstructive sleep apnea by his recent sleep study, and has an underlying cardiomyopathy as well.  I have stressed to him the importance of aggressive treatment here, regardless of whether he has significant daytime sleepiness or not.  I have had a long discussion with him about sleep apnea, including its impact the quality of life and cardiovascular health.  I would like to start the patient on CPAP, and he is agreeable to trying the device. I will set the patient up on cpap at a moderate pressure level to allow for desensitization, and will troubleshoot the device over the next 4-6weeks if needed.  The pt is to call me if having issues with tolerance.  Will then optimize the pressure once patient is able to wear cpap on a consistent basis.

## 2011-09-16 NOTE — Progress Notes (Signed)
  Subjective:    Patient ID: [name], male    DOB: [birth year], 78 y.o.   MRN: [id number]  HPI The patient is a 78 year old male who I've been asked to see for management of obstructive sleep apnea.  He recently underwent a sleep study, where he was found to have an AHI of 34 events per hour.  The patient also has an underlying cardiomyopathy which can be significantly impacted by sleep disordered breathing.  His wife notes loud snoring, as well as an abnormal breathing pattern during sleep.  This has been an issue for him for years and years.  He has frequent awakenings at night, and is not rested in the mornings upon awakening.  Despite this, he denies any issue with daytime or evening sleepiness.  He feels that his alertness is adequate.  He denies any issues with sleepiness while driving.  Of note, the patient states his weight is down 10 pounds over the last few years.  His Epworth sleepiness score today is only one.  Sleep Questionnaire: What time do you typically go to bed?( Between what hours) 10-11 How long does it take you to fall asleep? 57mnutes How many times during the night do you wake up? 4 What time do you get out of bed to start your day? 0730 Do you drive or operate heavy machinery in your occupation? No How much has your weight changed (up or down) over the past two years? (In pounds) 10 lb (4.536 kg) Have you ever had a sleep study before? Yes [place] yes[place] Do you currently use CPAP? No Do you wear oxygen at any time? No    Review of Systems  Constitutional: Negative for fever and unexpected weight change.  HENT: Negative for ear pain, nosebleeds, congestion, sore throat, rhinorrhea, sneezing, trouble swallowing, dental problem, postnasal drip and sinus pressure.   Eyes: Negative for redness and itching.  Respiratory: Negative for cough, chest tightness, shortness of breath and wheezing.   Cardiovascular: Negative for  palpitations and leg swelling.  Gastrointestinal: Negative for nausea and vomiting.  Genitourinary: Negative for dysuria.  Musculoskeletal: Negative for joint swelling.  Skin: Negative for rash.  Neurological: Negative for headaches.  Hematological: Does not bruise/bleed easily.  Psychiatric/Behavioral: Negative for dysphoric mood. The patient is not nervous/anxious.   All other systems reviewed and are negative.       Objective:   Physical Exam Constitutional:  Well developed, no acute distress  HENT:  Nares patent without discharge, deviated septum to left with significant narrowing.   Oropharynx without exudate, palate and uvula are mildly elongated.   Eyes:  Perrla, eomi, no scleral icterus  Neck:  No JVD, no TMG  Cardiovascular:  Normal rate, regular rhythm, no rubs or gallops.  No murmurs        Intact distal pulses  Pulmonary :  Normal breath sounds, no stridor or respiratory distress   No rales, rhonchi, or wheezing  Abdominal:  Soft, nondistended, bowel sounds present.  No tenderness noted.   Musculoskeletal:  2+lower extremity edema noted.  Lymph Nodes:  No cervical lymphadenopathy noted  Skin:  No cyanosis noted  Neurologic:  Alert, appropriate, moves all 4 extremities without obvious deficit.         Assessment & Plan:

## 2011-09-23 ENCOUNTER — Ambulatory Visit (HOSPITAL_COMMUNITY): Payer: Medicare Other | Attending: Cardiology

## 2011-09-23 ENCOUNTER — Encounter (INDEPENDENT_AMBULATORY_CARE_PROVIDER_SITE_OTHER): Payer: Medicare Other

## 2011-09-23 DIAGNOSIS — I2589 Other forms of chronic ischemic heart disease: Secondary | ICD-10-CM | POA: Insufficient documentation

## 2011-09-23 DIAGNOSIS — I080 Rheumatic disorders of both mitral and aortic valves: Secondary | ICD-10-CM | POA: Insufficient documentation

## 2011-09-23 DIAGNOSIS — R609 Edema, unspecified: Secondary | ICD-10-CM

## 2011-09-23 DIAGNOSIS — I379 Nonrheumatic pulmonary valve disorder, unspecified: Secondary | ICD-10-CM | POA: Insufficient documentation

## 2011-09-23 DIAGNOSIS — I2510 Atherosclerotic heart disease of native coronary artery without angina pectoris: Secondary | ICD-10-CM | POA: Insufficient documentation

## 2011-09-23 DIAGNOSIS — I10 Essential (primary) hypertension: Secondary | ICD-10-CM | POA: Insufficient documentation

## 2011-09-23 DIAGNOSIS — I509 Heart failure, unspecified: Secondary | ICD-10-CM | POA: Diagnosis not present

## 2011-09-23 DIAGNOSIS — Z87891 Personal history of nicotine dependence: Secondary | ICD-10-CM | POA: Diagnosis not present

## 2011-09-23 DIAGNOSIS — I079 Rheumatic tricuspid valve disease, unspecified: Secondary | ICD-10-CM | POA: Diagnosis not present

## 2011-09-23 DIAGNOSIS — I5022 Chronic systolic (congestive) heart failure: Secondary | ICD-10-CM

## 2011-09-23 NOTE — Progress Notes (Signed)
Echocardiogram performed.  

## 2011-09-27 ENCOUNTER — Encounter: Payer: Self-pay | Admitting: Physician Assistant

## 2011-09-27 ENCOUNTER — Telehealth: Payer: Self-pay | Admitting: *Deleted

## 2011-09-27 DIAGNOSIS — K5090 Crohn's disease, unspecified, without complications: Secondary | ICD-10-CM | POA: Diagnosis not present

## 2011-09-27 NOTE — Telephone Encounter (Signed)
s/w pt's wife and she is aware of  Lower Extremity Venous Duplex  results no DVT bilaterally, she gave me verbal understanding

## 2011-09-27 NOTE — Telephone Encounter (Signed)
Message copied by Michae Kava on Tue Sep 27, 2011  3:20 PM ------      Message from: [place], [place] T      Created: Tue Sep 27, 2011  2:02 PM       No DVT bilaterally      Richardson Dopp, [place]  2:01 PM 09/27/2011

## 2011-10-05 ENCOUNTER — Ambulatory Visit (INDEPENDENT_AMBULATORY_CARE_PROVIDER_SITE_OTHER): Payer: Medicare Other | Admitting: *Deleted

## 2011-10-05 DIAGNOSIS — K55059 Acute (reversible) ischemia of intestine, part and extent unspecified: Secondary | ICD-10-CM

## 2011-10-05 DIAGNOSIS — K55069 Acute infarction of intestine, part and extent unspecified: Secondary | ICD-10-CM

## 2011-10-10 ENCOUNTER — Encounter: Payer: Medicare Other | Admitting: Physician Assistant

## 2011-10-10 ENCOUNTER — Ambulatory Visit (INDEPENDENT_AMBULATORY_CARE_PROVIDER_SITE_OTHER): Payer: Medicare Other | Admitting: *Deleted

## 2011-10-10 DIAGNOSIS — K55059 Acute (reversible) ischemia of intestine, part and extent unspecified: Secondary | ICD-10-CM | POA: Diagnosis not present

## 2011-10-10 DIAGNOSIS — K55069 Acute infarction of intestine, part and extent unspecified: Secondary | ICD-10-CM

## 2011-10-10 LAB — POCT INR: INR: 1.4

## 2011-10-12 DIAGNOSIS — Z7901 Long term (current) use of anticoagulants: Secondary | ICD-10-CM | POA: Diagnosis not present

## 2011-10-12 DIAGNOSIS — D696 Thrombocytopenia, unspecified: Secondary | ICD-10-CM | POA: Diagnosis not present

## 2011-10-12 DIAGNOSIS — D72819 Decreased white blood cell count, unspecified: Secondary | ICD-10-CM | POA: Diagnosis not present

## 2011-10-12 DIAGNOSIS — K5090 Crohn's disease, unspecified, without complications: Secondary | ICD-10-CM | POA: Diagnosis not present

## 2011-10-12 DIAGNOSIS — D649 Anemia, unspecified: Secondary | ICD-10-CM | POA: Diagnosis not present

## 2011-10-18 ENCOUNTER — Ambulatory Visit (INDEPENDENT_AMBULATORY_CARE_PROVIDER_SITE_OTHER): Payer: Medicare Other | Admitting: Pharmacist

## 2011-10-18 DIAGNOSIS — K55069 Acute infarction of intestine, part and extent unspecified: Secondary | ICD-10-CM

## 2011-10-18 DIAGNOSIS — K55059 Acute (reversible) ischemia of intestine, part and extent unspecified: Secondary | ICD-10-CM

## 2011-10-27 ENCOUNTER — Other Ambulatory Visit: Payer: Self-pay | Admitting: *Deleted

## 2011-10-27 MED ORDER — WARFARIN SODIUM 5 MG PO TABS: 5.0000 mg | ORAL_TABLET | ORAL | Status: DC

## 2011-11-01 ENCOUNTER — Ambulatory Visit (INDEPENDENT_AMBULATORY_CARE_PROVIDER_SITE_OTHER): Payer: Medicare Other

## 2011-11-01 DIAGNOSIS — K55059 Acute (reversible) ischemia of intestine, part and extent unspecified: Secondary | ICD-10-CM | POA: Diagnosis not present

## 2011-11-01 DIAGNOSIS — K55069 Acute infarction of intestine, part and extent unspecified: Secondary | ICD-10-CM

## 2011-11-11 ENCOUNTER — Ambulatory Visit (INDEPENDENT_AMBULATORY_CARE_PROVIDER_SITE_OTHER): Payer: Medicare Other | Admitting: *Deleted

## 2011-11-11 DIAGNOSIS — K55059 Acute (reversible) ischemia of intestine, part and extent unspecified: Secondary | ICD-10-CM

## 2011-11-11 DIAGNOSIS — K55069 Acute infarction of intestine, part and extent unspecified: Secondary | ICD-10-CM

## 2011-11-11 LAB — POCT INR: INR: 2.6

## 2011-11-18 ENCOUNTER — Encounter: Payer: Self-pay | Admitting: Cardiology

## 2011-11-18 DIAGNOSIS — R600 Localized edema: Secondary | ICD-10-CM | POA: Insufficient documentation

## 2011-11-18 DIAGNOSIS — R9430 Abnormal result of cardiovascular function study, unspecified: Secondary | ICD-10-CM | POA: Insufficient documentation

## 2011-11-22 ENCOUNTER — Encounter: Payer: Self-pay | Admitting: Cardiology

## 2011-11-22 ENCOUNTER — Ambulatory Visit (INDEPENDENT_AMBULATORY_CARE_PROVIDER_SITE_OTHER): Payer: Medicare Other | Admitting: *Deleted

## 2011-11-22 ENCOUNTER — Ambulatory Visit (INDEPENDENT_AMBULATORY_CARE_PROVIDER_SITE_OTHER): Payer: Medicare Other | Admitting: Cardiology

## 2011-11-22 VITALS — BP 128/76 | HR 65 | Ht 72.0 in | Wt 193.0 lb

## 2011-11-22 DIAGNOSIS — K55069 Acute infarction of intestine, part and extent unspecified: Secondary | ICD-10-CM

## 2011-11-22 DIAGNOSIS — I2510 Atherosclerotic heart disease of native coronary artery without angina pectoris: Secondary | ICD-10-CM

## 2011-11-22 DIAGNOSIS — I2589 Other forms of chronic ischemic heart disease: Secondary | ICD-10-CM | POA: Diagnosis not present

## 2011-11-22 DIAGNOSIS — R600 Localized edema: Secondary | ICD-10-CM

## 2011-11-22 DIAGNOSIS — K55059 Acute (reversible) ischemia of intestine, part and extent unspecified: Secondary | ICD-10-CM

## 2011-11-22 DIAGNOSIS — R609 Edema, unspecified: Secondary | ICD-10-CM | POA: Diagnosis not present

## 2011-11-22 LAB — BASIC METABOLIC PANEL
BUN: 11 mg/dL (ref 6–23)
CO2: 31 mEq/L (ref 19–32)
Chloride: 103 mEq/L (ref 96–112)
Potassium: 3.6 mEq/L (ref 3.5–5.1)

## 2011-11-22 NOTE — Assessment & Plan Note (Signed)
Patient is on appropriate medications for his cardiomyopathy. No change in therapy.

## 2011-11-22 NOTE — Assessment & Plan Note (Signed)
Coronary disease is stable. No change in therapy.

## 2011-11-22 NOTE — Assessment & Plan Note (Signed)
The patient has chronic swelling of the left leg. There is no edema in the right leg. I believe that his total volume status is stable on his current diuretic dose. Renal function has been checked in August. He needs to be checked again today to be sure that it is stable with him on 60 mg of Lasix daily. I've instructed him to continue this dose. I've encouraged him to consider using support hose for his left leg. If he has persistent edema in the left leg he can take a short course of a higher dose of diuretic. If he has any swelling in his right leg that persists he should be in touch with me.

## 2011-11-22 NOTE — Progress Notes (Signed)
HPI  Patient is seen today to followup cardiomyopathy and fluid overload. He is actually feeling well. He has venous insufficiency in his left leg. When he gets edema he gets it in his left leg. On one occasion in August, 2013, he had some edema in his right leg. His diuretic dose was increased at that time. The dose was increased to 60 mg daily. He took 60 mg twice a day for 3 days and then remain on a higher dose of 60 mg daily. Since then he has been stable. He does have some edema that comes and goes during the day in his left leg but not his right leg.  No Known Allergies  Current Outpatient Prescriptions  Medication Sig Dispense Refill  . aspirin 81 MG tablet Take 81 mg by mouth daily.        . carvedilol (COREG) 6.25 MG tablet Take 6.25 mg by mouth 2 (two) times daily with a meal.      . cholecalciferol (VITAMIN D) 1000 UNITS tablet Take 1,000-2,000 Units by mouth daily.       . Cyanocobalamin (B-12) 1000 MCG CAPS Take 1 capsule by mouth daily.        . folic acid (FOLVITE) 1 MG tablet Take 1 mg by mouth daily.        . furosemide (LASIX) 40 MG tablet Take 1.5 tablets (60 mg total) by mouth daily.  45 tablet  11  . lisinopril (PRINIVIL,ZESTRIL) 10 MG tablet Take 1 tablet (10 mg total) by mouth daily.  90 tablet  3  . simvastatin (ZOCOR) 20 MG tablet Take 1 tablet (20 mg total) by mouth at bedtime.  90 tablet  3  . spironolactone (ALDACTONE) 25 MG tablet Take 25 mg by mouth daily.      [name] warfarin (COUMADIN) 5 MG tablet Take 1 tablet (5 mg total) by mouth as directed.  150 tablet  1    History   Social History  . Marital Status: Married    Spouse Name: Kennyth Lose    Number of Children: 3  . 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 Smoking status: Former Smoker -- 1.0 packs/day for 20 years    Types: Cigarettes    Quit date: 01/31/1973  . Smokeless tobacco: Never Used  . Alcohol Use: Yes   occasional  . Drug Use: No  . Sexually Active: Not on file   Other Topics Concern  . Not on file   Social History Narrative   Lives in [place], [place] with wife.     Family History  Problem Relation Age of Onset  . Heart disease Mother   . Hypertension    . Hyperlipidemia    . Tremor Sister   . Heart disease Father 68    Deceased   . Cancer Brother     Past Medical History  Diagnosis Date  . Dizziness     Positional dizziness  . Hypertension   . Ischemic cardiomyopathy     CABG 1994 CAD catherization 1996.[name] occluded vein graft to the diagnol, other grafts patent/  catherization 2006, no PCI/ nuclear.[name] october 2011.[name] extensive scar anteroseptal and apical.. no ischemia    . CHF (congestive heart failure)     chronic systolic  . Mitral regurgitation     mild.[name] echo november 2011  EF 35%...echo.. november 2009/ ef 35%.[name] echo  november 2011  . Sinus bradycardia   . Chronotropic incompetence   . Biventricular implantable cardiac defibrillator in situ     GDT CONTAK H170-MADIT-CRT-EXPLANTED 2011 implanted defibrillator- [employer] cognis, model [id number] Dr. Caryl Comes (11/28/2009)  . LBBB (left bundle branch block)   . Hyperlipemia   . GERD (gastroesophageal reflux disease)   . Colonic polyp   . Crohn's disease   . Acute vascular insufficiency of intestine     Mesenteric embolus...surgery 2003  . Ventral hernia   . Hypertrophy of prostate with urinary obstruction and other lower urinary tract symptoms (LUTS)   . Degenerative joint disease   . Polymyalgia rheumatica   . Tremor   . Anxiety   . Shingles   . Vitamin B12 deficiency   . CAD (coronary artery disease)     CABG 1994  /   catheterization 1996, occluded vein graft to the diagonal, other grafts patent  /   catheterization 2006, no PCI  /  nuclear, October, 2011, extensive scar anteroseptal and apical, no ischemia  . Hx of CABG     1994  . Ejection fraction     EF 35%, echo, November, 2011  //   Is 35-40%, echo,  September 23, 2011  . Warfarin anticoagulation     Coumadin therapy for mesenteric embolus 2003  . Carotid artery disease     Doppler, February,  2012, 0-39% bilateral, recommended followup 1 year  . Edema leg     Venous Dopplers 8/13: No DVT bilaterally    Past Surgical History  Procedure Date  . Coronary artery bypass graft 1994  . Laparoscopic cholecystectomy 2002  . Elap w/ superior mesenteric artery embolectomy 1/03    by Dr. Jennette Banker  . Repair of incarcerated ventral hernia and lysis of adhesions 11/03    by Dr. Betsy Pries  . Implantation of biventric cardioverter-defibrillatorr     by Dr. Lovena Le in 8/06 [employer] [place] 2011  . Implantation of guidant cognis device     model [id number]  . Left inguinal hernia repair with mesh 6/08    by Dr. Betsy Pries    Patient Active Problem List  Diagnosis  . SHINGLES  . COLONIC POLYPS  . VITAMIN B12 DEFICIENCY  . VITAMIN D DEFICIENCY  . ANXIETY  . CARDIOMYOPATHY, ISCHEMIC  s/p CABG    . LBBB  . BRONCHITIS, ACUTE  . SINUSITIS  . GERD  . ACUTE VASCULAR INSUFFICIENCY OF INTESTINE  . HYPERTROPHY PROSTATE W/UR OBST & OTH LUTS  . DEGENERATIVE JOINT DISEASE  . POLYMYALGIA RHEUMATICA  . FATIGUE  . IMPLANTABLE DEFIBRILLATOR, GDT CONTAK H170-MADIT-CRT  . Mesenteric thrombosis  . CAD (coronary artery disease)  . Hx of CABG  . Dizziness  . Hypertension  . Chronic systolic heart failure  . Mitral regurgitation  . Sinus bradycardia  . Chronotropic incompetence  . Hyperlipemia  . Crohn's disease  . Ventral hernia  . Tremor  . Warfarin anticoagulation  . Carotid artery disease  . Abdominal pain  . SBO (small bowel obstruction)  . OSA (obstructive sleep apnea)  . Ejection fraction  . Edema leg    ROS   Patient denies fever, chills, headache, sweats, rash, change in vision, change in hearing, chest pain, cough, nausea vomiting, urinary symptoms. All other systems are reviewed and are negative.  PHYSICAL EXAM  Patient is  oriented to person time and place. Affect is normal. He's here with his wife. Lungs are clear. Respiratory effort is nonlabored. Cardiac exam her vitals S1  and S2. There no clicks or significant murmurs. The abdomen is soft. He has mild chronic swelling of the left leg. There is no edema in the right leg.  Filed Vitals:   11/22/11 1149  BP: 128/76  Pulse: 65  Height: 6' (1.829 m)  Weight: 193 lb (87.544 kg)     ASSESSMENT & PLAN

## 2011-11-22 NOTE — Patient Instructions (Addendum)
Your physician wants you to follow-up in: 6 months.   You will receive a reminder letter in the mail two months in advance. If you don't receive a letter, please call our office to schedule the follow-up appointment.  Your physician has recommended you make the following change in your medication: Stay on the 62m of furosemide (lasix)   Your physician recommends that you return for lab work in: today (bmet)

## 2011-11-24 ENCOUNTER — Ambulatory Visit (INDEPENDENT_AMBULATORY_CARE_PROVIDER_SITE_OTHER): Payer: Medicare Other | Admitting: *Deleted

## 2011-11-24 ENCOUNTER — Ambulatory Visit: Payer: Medicare Other | Admitting: Pulmonary Disease

## 2011-11-24 DIAGNOSIS — I5022 Chronic systolic (congestive) heart failure: Secondary | ICD-10-CM

## 2011-11-24 DIAGNOSIS — Z95810 Presence of automatic (implantable) cardiac defibrillator: Secondary | ICD-10-CM | POA: Diagnosis not present

## 2011-11-24 DIAGNOSIS — I2589 Other forms of chronic ischemic heart disease: Secondary | ICD-10-CM | POA: Diagnosis not present

## 2011-11-25 LAB — REMOTE ICD DEVICE
BRDY-0003RA: 130 {beats}/min
BRDY-0004RA: 130 {beats}/min
DEVICE MODEL ICD: 490477
FVT: 0
HV IMPEDENCE: 55 Ohm
PACEART VT: 0
RV LEAD IMPEDENCE ICD: 571 Ohm
TZAT-0001FASTVT: 2
TZAT-0002FASTVT: NEGATIVE
TZAT-0018FASTVT: NEGATIVE
TZST-0001FASTVT: 5
TZST-0001FASTVT: 7
TZST-0001FASTVT: 8
TZST-0003FASTVT: 31 J
TZST-0003FASTVT: 41 J
VENTRICULAR PACING ICD: 96 pct

## 2011-11-29 ENCOUNTER — Encounter: Payer: Self-pay | Admitting: *Deleted

## 2011-11-30 ENCOUNTER — Encounter: Payer: Self-pay | Admitting: Pulmonary Disease

## 2011-11-30 ENCOUNTER — Ambulatory Visit (INDEPENDENT_AMBULATORY_CARE_PROVIDER_SITE_OTHER): Payer: Medicare Other | Admitting: Pulmonary Disease

## 2011-11-30 VITALS — BP 116/72 | HR 64 | Temp 97.2°F | Ht 72.0 in | Wt 191.8 lb

## 2011-11-30 DIAGNOSIS — J069 Acute upper respiratory infection, unspecified: Secondary | ICD-10-CM

## 2011-11-30 DIAGNOSIS — I2510 Atherosclerotic heart disease of native coronary artery without angina pectoris: Secondary | ICD-10-CM | POA: Diagnosis not present

## 2011-11-30 DIAGNOSIS — N401 Enlarged prostate with lower urinary tract symptoms: Secondary | ICD-10-CM

## 2011-11-30 DIAGNOSIS — D126 Benign neoplasm of colon, unspecified: Secondary | ICD-10-CM

## 2011-11-30 DIAGNOSIS — E785 Hyperlipidemia, unspecified: Secondary | ICD-10-CM

## 2011-11-30 DIAGNOSIS — G4733 Obstructive sleep apnea (adult) (pediatric): Secondary | ICD-10-CM

## 2011-11-30 DIAGNOSIS — I10 Essential (primary) hypertension: Secondary | ICD-10-CM | POA: Diagnosis not present

## 2011-11-30 DIAGNOSIS — K219 Gastro-esophageal reflux disease without esophagitis: Secondary | ICD-10-CM

## 2011-11-30 DIAGNOSIS — N138 Other obstructive and reflux uropathy: Secondary | ICD-10-CM

## 2011-11-30 DIAGNOSIS — I2589 Other forms of chronic ischemic heart disease: Secondary | ICD-10-CM | POA: Diagnosis not present

## 2011-11-30 DIAGNOSIS — Z23 Encounter for immunization: Secondary | ICD-10-CM | POA: Diagnosis not present

## 2011-11-30 DIAGNOSIS — D696 Thrombocytopenia, unspecified: Secondary | ICD-10-CM

## 2011-11-30 DIAGNOSIS — K5090 Crohn's disease, unspecified, without complications: Secondary | ICD-10-CM

## 2011-11-30 DIAGNOSIS — I779 Disorder of arteries and arterioles, unspecified: Secondary | ICD-10-CM

## 2011-11-30 DIAGNOSIS — F411 Generalized anxiety disorder: Secondary | ICD-10-CM

## 2011-11-30 DIAGNOSIS — R251 Tremor, unspecified: Secondary | ICD-10-CM

## 2011-11-30 DIAGNOSIS — K55059 Acute (reversible) ischemia of intestine, part and extent unspecified: Secondary | ICD-10-CM

## 2011-11-30 DIAGNOSIS — M1990 Unspecified osteoarthritis, unspecified site: Secondary | ICD-10-CM

## 2011-11-30 MED ORDER — AZITHROMYCIN 250 MG PO TABS: 250.0000 mg | ORAL_TABLET | ORAL | Status: DC

## 2011-11-30 NOTE — Patient Instructions (Addendum)
Today we updated your med list in our EPIC system...    Continue your current medications the same...  We wrote a prescription for a ZPak to take for your sinus/ upper resp infection...    You may also use a SALINE nasal mist to spray every 1-2 H as needed for the congestion...  Continue your regular check ups w/ your specialty physicians...  Let's plan a follow up here in 6 months but call anytime if needed for problems.Marland KitchenMarland Kitchen

## 2011-11-30 NOTE — Progress Notes (Addendum)
Subjective:    Patient ID: [name], male    DOB: 11/30/1931, 78 y.o.   MRN: [id number]  HPI 78 y/o WM here for a follow up visit... he has multiple medical problems as noted below...    ~  [place]owdhury [place] w/ sm bowel Crohn's- s/p mult resections w/ complications, stable on 6-MP for prevention of recurrence... also has sl incr fasting bili c/w Gilbert's... ~  followed by [name] for Cards- w/ CAD, s/p CABG, Cardiomyopathy w/ AICD- stable... f/u 2DEcho 11/09 showed diffuseHK w/ EF= 35% & DD, mod incr AoV thickness w/o AS... EKG= pacer rhythm.  ~  July 21, 2009:  he saw [facility] 6/11 w/ some incr edema & SPIRONOLACTONE 12.61m/d started & KCl stopped... he notes dizziness improved, still w/ mult somatic complaints & we discussed holding tight for now.  ~  November 18, 2009:  he has maintained regular f/u w/ Cards- DBenay Spice8/11 & DrKlein 10/11- pacer generator near EOL and pt noted decr energy "I'm slowing down, harder to get going";  they plan Myoview 1st, then device generator replacement... he is due for a follow up appt w/ DrChowdury as well... due for Flu shot today & f/u fasting labs (see below)...  ~  May 19, 2010:  682moOV & he notes that his Crohn's has been acting up- followed by [place] 6MP & ?cholestyramine added?  He wants Prednisone restarted & I asked him to call GI- now DrHolmes to discuss, in the meanwhile we will give him a Pred taper, but he understands that a regular dose of this med requires GI to follow response;      He saw DrBenay Spice/12 & DrKlein 12/11- cardiomyop stable (extensive old scar, no ischemia, EF=34%), CDopplers no change, AICD replaced 10/11 & then reprogrammed for chronotropic incompetence, Coreg decr to 6.25Bid...  He had labs 10/11 & these are reviewed w/ pt & wife...  ~  November 24, 2010:  76m21moV & he has been generally stable, notes some discomfort in his lower back & left hip area w/ occas radiation down left leg; we discussed heat,  Advil, exercises, and the need for Ortho eval if the discomfort persists or worsens...     HBP, CAD, Cardiomyop> on Coreg6.25Bid, Lisinopril10, Lasix40, Spironolactone25 & Coumadin; BP= 126/68 & he denies CP, palpit, dizzy, ch in SOB, edema, etc; he saw DrKlein 7/12- felt to be stable on meds & defibrillator interrogated, no changes made...    Chol> on Simva20 & f/u FLP looks good, at goal, continue same...    GI> Gerd, Polyps, Crohn's> on 6MP [place]owdhury [place]; stable, continue same rx...    Hx intestinal vasc insuffic 2003> he remains on Coumadin followed in the [facility] & stable...    GU> BPH followed by DrNesi & prev on Flomax but he stopped & states voiding is satis etc...    DJD/ ?PMR> off Pred, he states he read where this "comes from crohn's" he says; see above & he will try Heat, Advil, Tylenol...    Tremor/ Anxiety> prev on Klonopin 0.81m67m from us &[place], DrYan, but he stopped on his own...  ~  May 25, 2011:  79mo 70mo& [name] iWille [name]/o weakness, fatigue, lack of energy; also notes freq nosebleeds on & off, not severe & rec to use Saline Q1-2H while awake; on Coumadin followed in the [facility] & requiring large doses (15-20mg/79m.     He was seen 3/13  by TP for post hosp visit after Adm for SBO>  Disch summary, XRays, & LABS reviewed in detail w/ pt & wife...    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 Last nuclear study 10/2009: Extensive scar anteroseptal and apical walls, no ischemia. Last echo 12/2009: EF 35%, mild MR. Carotid Dopplers 2/2012Colon 0-39% bilateral> No med changes initiated...    [place]; seen by DrHolmes at [place] 7 pt reports that they think his 6MP has caused some blood abnormality (notes pending)...  LABS 4/13:  Chems- wnl x sl incr  TBili=2.5 SGOT=45;  CBC- ok x WBC=4.3 Plat=70K;  Fe=93 (32%);  TSH=1.63;  B12=812;  Protime= way off 7 referred to CC stat.  ~  November 30, 2011:  48moROV & JWille Glaseris c/o a URI/ cold/ sinus infection w/ congestion/ drainage/ beige sput/ etc; we discussed ZPak & Saline for treatment... We reviewed the following medical problems during today's office visit>>     OSA> eval by DrClance 8/13 & started on CPAP but having some interface problems & they are checking download for compliance etc...    HBP> on Coreg6.25Bid, Lisin10, Lasix40-1.5/d, Aldactone25; BP= 116/72 & he denies any CP, palpit, ch in SOB/ edema/ etc...    CAD, Cardiomyopathy> on ASA81 & Coumadin; s/p CABG 1994, [facility] & Lasix increased to 693md; last seen by DrKlein 7/13 & ICD functioning normally...    Chol> on Simva20; not fasting today & his last FLP 10/12 showed TChol 124, TG 109, HDL 49, LDL 54    GI- GERD, Polyps> stable & he currently denies abd pain, n/v, c/d, blood seen...    Crohn's dis, Hx SBO> he is followed by DrHolmes, [place] and he [name] & they have stopped his 6MP; Crohn's has been stable since then 7 he indicated he may start a biologic med in the future...    Hx mesenteric vasc insuffic w/ embolectomy> on Coumadin & monitored in the CC, stable...    BPH> aware- prev checked by drNesi, PSAs have been wnl...    DJD, PMR> on B12, VitD, Folate; stable & not requiring pain meds or Pred...    Tremor> eval by DrDalphine Handingn the past w/ trial of Clonazepam w/ some improvement...    Anxiety> not currently on Klonopin etc...    Low [place] ct> [place], [place]; Plats ranged 67-73K & they stopped his 6MP Rx for crohn's dis... We reviewed prob list, meds, xrays and labs> see below for updates >>           Problem List:  OSA >> He had a sleep study w/ AHI=34- loud snoring & abnormal breathing pattern  noted by wife; pt notes freq awakenings & not feeling rested in the AM; but he denied issues w/ daytime sleepiness & his ESS= only1... Started on CPAP & they are following up...  HBP, CAD, Cardiomyopathy - followed by DrBenay Spicend DrKlein... he had CABG in 1994 (with cath in 96 revealing a total right and an 80% OM, vein graft to the diagonal was occluded, other bypasses were patent), Biv AICD in 2006...  On COUMADIN (  follow in clinic), ASA 84m/d,  COREG 6.274mid,  LISINOPRIL 2076m,  LASIX 38m29m  SPIRONOLACTONE 25mg30m. ~  Cardiolite 3/03 w/ EF=37%...  ~  Cath 6/06 w/ native vessel and graft disease...  ~  2DEcho 11/09 stable w/ mod LVD- diffuse HK w/ EF= 35%, DD, mod incr AoV thickness w/o AS...  ~  3/11:  CXR showed stable CABG, AICD, & chr changes... ~  10/11:  Myoview showed extensive scar, no ischemia, EF= 34% ~  10/11:  New AICD placed by DrKlein... ~  11/11:  2DEcho showed mod reduced LV sys function w/ EF= 30-35%, diffuse HK, gr1 DD> see report... ~  10/12:  BP= 126/68 & stable from the CV perspective... ~  4/13:  He's had f/u w/ Cards> DrKatz et al; notes reviewed, no med changes other than Coumadin regulation... ~  7/13:  His AICD was interrogated by DrKlein & no problems identified... ~  8/13:  2DEcho showed mild LVH, mod decr LVF w/ EF=35-40% w/ HK of the entire myocardium, Gr1DD, mild AoV sclerosis, mild MR, normal RV... ~  8/13:  VenDopplers of LEs ordered by Cards- neg for DVT ~  10/13:  He had f/u DrKatz who noted edema is diminished on the Lasix60... ~  10/13: on Coreg6.25Bid, Lisin10, Lasix40-1.5/d, Aldactone25; BP= 116/72 & he denies any CP, palpit, ch in SOB/ edema/ etc...  HYPERLIPIDEMIA - he takes SIMVASTATIN 20mg/65m he's had min, non-progressive incr LFTs noted. ~  FLP 4/[place] 138, TG 138, HDL 38, LDL 72... tolerating med well. ~  FLP 4/[place] TChol 118, TG 111, HDL 33, LDL 63 ~  FLP 12/09 showed TChol 118, TG 100, HDL 31, LDL 67 ~  FLP 10/10 on Simva20  showed TChol 111, TG 139, HDL 36, LDL 47 ~  FLP 10/11 on Simva20 showed TChol 101, TG 102, HDL 35, LDL 45 ~  FLP 10/12 on Simva20 showed TChol 124, TG 109, HDL 49, LDL 54 ~  FLP 10/13> pt wasn't fasting for blood work today...  GERD (ICD-530.81) - no active symptoms and not currently taking PPI etc...  COLONIC POLYPS (ICD-211.3) - last colonoscopy here was 7/04 by DrKaplDemetra Shinervertics and several polyps from 6 - 20mm s33m reportedly adenomatous on bx but I cannot find the path reports... he is overdue for f/u colon... ~  labs 2/10 showed Hg= 15.7... we wMarland KitchenMarland Kitchenl send copy of note to DrChowdhury w/ request for f/u colonoscopy. ~  f/u colonoscopy 11/10 by DrChowdhury showed norm TI & several polyps removed- path +tubular adenoma...  CROHN'S DISEASE (ICD-555.9) - he is [place]Chowdhury [place] 6-MP 50mg ta62m1.5 tabs/d... he notes intermittent diarrhea and was started on Questran Prn... ~  labs 3/11 noted sl incr LFTs, Protime, & Sed... ~  labs 10/11 showed norm chems & LFTs x incr bili, Plat=88K, BNP=219, otherw norm. ~  Labs 10/12 showed norm chems, SGOT=59, TBili=3.9, Plat=85K... ~  [facility] to [facility]ocytopenia & doing satis, considering a biologic if needed, considering repeat colonoscopy...  Hx of ACUTE VASCULAR INSUFFICIENCY OF INTESTINE (ICD-557.0) - he has embolectomy of the superior mesenteric artery 1/03 by DrLawsonSheryn Bisonospitalization w/ complications)... he has remained on COUMADIN in the coumadin clinic ever since then...  VENTRAL HERNIA (ICD-553.20) - he is s/p left inguinal hernia repair 6/08 by DrIngramClarita Leberd a prev incarcerated ventral hernia repaired 11/03... his residual/ recurrent ventral hernia has been evaluated by DrIngramClarita Leberls that he would need eval  in [place] if this hernia comes to surg... currently he wears an abd binder when up and about...  SBO >> Hosp 3/14 - 04/19/11 by [facility] w/ SBO that opened up w/o surgical  intervention...  HYPERTROPHY PROSTATE W/UR OBST & OTH LUTS (ICD-600.01) - saw DrNesi 7/10 w/ BPH, obstructive symptoms, and ED- given  Flomax & "shots"...  ~  Labs 10/12 showed PSA= 2.93  DEGENERATIVE JOINT DISEASE (ICD-715.90)  Hx of POLYMYALGIA RHEUMATICA (ICD-725) - hasn't req steroids x yrs... ~  labs 2/10 showed Sed= 8 ~  labs 10/10 showed Sed= 6 ~  labs 3/11 showed Sed= 51 w/ acute illness. ~  labs 10/11 showed Sed= 9 ~  Labs 10/12 showed Sed= 9  TREMOR (ICD-781.0) - [facility]- essential tremor w/ +FamHx in sister... trial Clonazepam 0.37m Bid w/ improvement...  ANXIETY (ICD-300.00) - he uses the same Clonazepam 0.221mPrn...  Hx of SHINGLES (ICD-053.9)  Thrombocytopenia >> serial labs w/ Plat counts betw 223 ==> 85; no bleeding diathesis, & he had acute intestinal vasc insuffic 2003 requiring embolectomy & has been on Coumadin Rx ever since then... ~    VITAMIN B12 DEFICIENCY (ICD-266.2) - on B 12 100035md OTC... ~  labs 2/10 showed B12 level = 215... rec start oral B12 supplement daily. ~  labs 10/10 showed B12 level = 416... continue oral B12 supplement. ~  labs 10/11 showed B12= 592... continue same. ~  Labs 10/12 showed B12= 573  VITAMIN D DEFICIENCY (ICD-268.9) - on Vit D 1000 u daily OTC... ~  labs 2/10 showed Vit D level = 23... rec> start Vit D 1000 u daily.. ~  Labs 10/12 showed Vit D level = 50   Past Surgical History  Procedure Date  . Coronary artery bypass graft 1994  . Laparoscopic cholecystectomy 2002  . Elap w/ superior mesenteric artery embolectomy 1/03    by Dr. LawJennette Banker Repair of incarcerated ventral hernia and lysis of adhesions 11/03    by Dr. HinBetsy Pries Implantation of biventric cardioverter-defibrillatorr     by Dr. TayLovena Le 8/06 [employer] [place]  . Implantation of guidant cognis device     model [id number] Left inguinal hernia repair with mesh 6/08    by Dr. HinBetsy Pries Outpatient  Encounter Prescriptions as of 11/30/2011  Medication Sig Dispense Refill  . aspirin 81 MG tablet Take 81 mg by mouth daily.        . carvedilol (COREG) 6.25 MG tablet Take 6.25 mg by mouth 2 (two) times daily with a meal.      . cholecalciferol (VITAMIN D) 1000 UNITS tablet Take 1,000-2,000 Units by mouth daily.       . Cyanocobalamin (B-12) 1000 MCG CAPS Take 1 capsule by mouth daily.        . folic acid (FOLVITE) 1 MG tablet Take 1 mg by mouth daily.        . furosemide (LASIX) 40 MG tablet Take 1.5 tablets (60 mg total) by mouth daily.  45 tablet  11  . lisinopril (PRINIVIL,ZESTRIL) 10 MG tablet Take 1 tablet (10 mg total) by mouth daily.  90 tablet  3  . simvastatin (ZOCOR) 20 MG tablet Take 1 tablet (20 mg total) by mouth at bedtime.  90 tablet  3  . spironolactone (ALDACTONE) 25 MG tablet Take 25 mg by mouth daily.      . wMarland Kitchenrfarin (COUMADIN) 5 MG tablet Take 1 tablet (5 mg total)  by mouth as directed.  150 tablet  1    No Known Allergies   Current Medications, Allergies, Past Medical History, Past Surgical History, Family History, and Social History were reviewed in [employer] record.    Review of Systems         See HPI - all other systems neg except as noted... The patient complains of decreased hearing and dyspnea on exertion.  The patient denies anorexia, fever, weight loss, weight gain, vision loss, hoarseness, chest pain, syncope, peripheral edema, prolonged cough, headaches, hemoptysis, abdominal pain, melena, hematochezia, severe indigestion/heartburn, hematuria, incontinence, muscle weakness, suspicious skin lesions, transient blindness, difficulty walking, depression, unusual weight change, abnormal bleeding, enlarged lymph nodes, and angioedema.     Objective:   Physical Exam     WD, WN, 78 y/o WM in NAD... GENERAL:  Alert & oriented; pleasant & cooperative... HEENT:  [place]/AT, EOM-wnl, PERRLA, EACs-clear, TMs-wnl, NOSE- sl red/ inflamm,  THROAT-clear & wnl. NECK:  Supple w/ fairROM; no JVD; normal carotid impulses w/o bruits; no thyromegaly or nodules palpated; no lymphadenopathy. CHEST:  Decr BS w/ few scat rhonchi, no wheezing, no rales, no signs of consolidation. HEART:  Regular Rhythm; gr 1/6 SEM without rubs or gallops detected... ABDOMEN:  Soft & nontender; mult scars, & ventral hernia; normal bowel sounds; no organomegaly or masses palpated. EXT: without deformities, mod arthritic changes; no varicose veins/ venous insuffic/ or edema. NEURO:  CN's intact; I do not apprec a tremor at present; no focal neuro deficits... DERM:  No lesions noted; no rash etc...  RADIOLOGY DATA:  Reviewed in the EPIC EMR & discussed w/ the patient...  LABORATORY DATA:  Reviewed in the EPIC EMR & discussed w/ the patient...   Assessment & Plan:    HBP>  BP controlled, continue same meds, and try to incr activity...  CAD/ Cardiomyopathy>  Followed by [name] & DrKlein, their notes are reviewed...  Hyperlipid>  On Simva20 + diet> continue same...  GI>  Followed [place] now by [facility] as DrChowdhury has moved to [facility]; continue meds & close f/u w/ GI; now off 6MP rx...  Hx vasc insuffic intestines>  SMA embolectomy 2003, he remains on Coumadin via the CC...  Hx SBO requiring Hosp 3/13 but resolved w/ conserv approach per DrHIngram, [facility]...  Other medical problems as noted...   Patient's Medications  New Prescriptions   AZITHROMYCIN (ZITHROMAX) 250 MG TABLET    Take 1 tablet (250 mg total) by mouth as directed.  Previous Medications   ASPIRIN 81 MG TABLET    Take 81 mg by mouth daily.     CHOLECALCIFEROL (VITAMIN D) 1000 UNITS TABLET    Take 1,000-2,000 Units by mouth daily.    CYANOCOBALAMIN (B-12) 1000 MCG CAPS    Take 1 capsule by mouth daily.     FOLIC ACID (FOLVITE) 1 MG TABLET    Take 1 mg by mouth daily.     FUROSEMIDE (LASIX) 40 MG TABLET    Take 1.5 tablets (60 mg total) by mouth daily.   LISINOPRIL (PRINIVIL,ZESTRIL) 10  MG TABLET    Take 1 tablet (10 mg total) by mouth daily.   SIMVASTATIN (ZOCOR) 20 MG TABLET    Take 1 tablet (20 mg total) by mouth at bedtime.   WARFARIN (COUMADIN) 5 MG TABLET    Take 1 tablet (5 mg total) by mouth as directed.  Modified Medications   Modified Medication Previous Medication   CARVEDILOL (COREG) 6.25 MG TABLET carvedilol (COREG) 6.25  MG tablet      Take 1 tablet (6.25 mg total) by mouth 2 (two) times daily with a meal.    Take 6.25 mg by mouth 2 (two) times daily with a meal.   SPIRONOLACTONE (ALDACTONE) 25 MG TABLET spironolactone (ALDACTONE) 25 MG tablet      Take 1 tablet (25 mg total) by mouth daily.    Take 25 mg by mouth daily.  Discontinued Medications   No medications on file

## 2011-12-06 ENCOUNTER — Ambulatory Visit (INDEPENDENT_AMBULATORY_CARE_PROVIDER_SITE_OTHER): Payer: Medicare Other

## 2011-12-06 DIAGNOSIS — K55069 Acute infarction of intestine, part and extent unspecified: Secondary | ICD-10-CM

## 2011-12-06 DIAGNOSIS — K55059 Acute (reversible) ischemia of intestine, part and extent unspecified: Secondary | ICD-10-CM

## 2011-12-12 DIAGNOSIS — Z7901 Long term (current) use of anticoagulants: Secondary | ICD-10-CM | POA: Diagnosis not present

## 2011-12-12 DIAGNOSIS — K5090 Crohn's disease, unspecified, without complications: Secondary | ICD-10-CM | POA: Diagnosis not present

## 2011-12-12 DIAGNOSIS — D649 Anemia, unspecified: Secondary | ICD-10-CM | POA: Diagnosis not present

## 2011-12-12 DIAGNOSIS — D696 Thrombocytopenia, unspecified: Secondary | ICD-10-CM | POA: Diagnosis not present

## 2011-12-12 DIAGNOSIS — D709 Neutropenia, unspecified: Secondary | ICD-10-CM | POA: Diagnosis not present

## 2011-12-20 ENCOUNTER — Ambulatory Visit (INDEPENDENT_AMBULATORY_CARE_PROVIDER_SITE_OTHER): Payer: Medicare Other | Admitting: *Deleted

## 2011-12-20 DIAGNOSIS — K55069 Acute infarction of intestine, part and extent unspecified: Secondary | ICD-10-CM

## 2011-12-20 DIAGNOSIS — K55059 Acute (reversible) ischemia of intestine, part and extent unspecified: Secondary | ICD-10-CM | POA: Diagnosis not present

## 2011-12-20 LAB — POCT INR: INR: 2.5

## 2012-01-02 ENCOUNTER — Encounter: Payer: Self-pay | Admitting: Internal Medicine

## 2012-01-03 ENCOUNTER — Telehealth: Payer: Self-pay | Admitting: Cardiology

## 2012-01-03 DIAGNOSIS — R0602 Shortness of breath: Secondary | ICD-10-CM

## 2012-01-03 DIAGNOSIS — I5022 Chronic systolic (congestive) heart failure: Secondary | ICD-10-CM

## 2012-01-03 DIAGNOSIS — R609 Edema, unspecified: Secondary | ICD-10-CM

## 2012-01-03 NOTE — Telephone Encounter (Signed)
New Problem:     Patient's wife called in needing a refill of all of his medications filled [facility].  Please call back if you have any questions.

## 2012-01-05 MED ORDER — FUROSEMIDE 40 MG PO TABS: 60.0000 mg | ORAL_TABLET | Freq: Every day | ORAL | Status: DC

## 2012-01-05 MED ORDER — SIMVASTATIN 20 MG PO TABS: 20.0000 mg | ORAL_TABLET | Freq: Every day | ORAL | Status: DC

## 2012-01-05 MED ORDER — SPIRONOLACTONE 25 MG PO TABS: 25.0000 mg | ORAL_TABLET | Freq: Every day | ORAL | Status: DC

## 2012-01-05 MED ORDER — CARVEDILOL 6.25 MG PO TABS: 6.2500 mg | ORAL_TABLET | Freq: Two times a day (BID) | ORAL | Status: DC

## 2012-01-05 MED ORDER — LISINOPRIL 10 MG PO TABS: 10.0000 mg | ORAL_TABLET | Freq: Every day | ORAL | Status: DC

## 2012-01-06 DIAGNOSIS — K5090 Crohn's disease, unspecified, without complications: Secondary | ICD-10-CM | POA: Diagnosis not present

## 2012-01-10 ENCOUNTER — Ambulatory Visit (INDEPENDENT_AMBULATORY_CARE_PROVIDER_SITE_OTHER): Payer: Medicare Other | Admitting: *Deleted

## 2012-01-10 DIAGNOSIS — K55059 Acute (reversible) ischemia of intestine, part and extent unspecified: Secondary | ICD-10-CM

## 2012-01-10 DIAGNOSIS — K55069 Acute infarction of intestine, part and extent unspecified: Secondary | ICD-10-CM

## 2012-02-01 HISTORY — PX: CATARACT EXTRACTION, BILATERAL: SHX1313

## 2012-02-06 DIAGNOSIS — Z7982 Long term (current) use of aspirin: Secondary | ICD-10-CM | POA: Diagnosis not present

## 2012-02-06 DIAGNOSIS — D696 Thrombocytopenia, unspecified: Secondary | ICD-10-CM | POA: Diagnosis not present

## 2012-02-06 DIAGNOSIS — K5090 Crohn's disease, unspecified, without complications: Secondary | ICD-10-CM | POA: Diagnosis not present

## 2012-02-07 ENCOUNTER — Ambulatory Visit (INDEPENDENT_AMBULATORY_CARE_PROVIDER_SITE_OTHER): Payer: Medicare Other

## 2012-02-07 DIAGNOSIS — K55059 Acute (reversible) ischemia of intestine, part and extent unspecified: Secondary | ICD-10-CM | POA: Diagnosis not present

## 2012-02-07 DIAGNOSIS — K55069 Acute infarction of intestine, part and extent unspecified: Secondary | ICD-10-CM

## 2012-02-07 LAB — POCT INR: INR: 3.5

## 2012-02-28 ENCOUNTER — Telehealth: Payer: Self-pay | Admitting: Pulmonary Disease

## 2012-02-28 ENCOUNTER — Ambulatory Visit (INDEPENDENT_AMBULATORY_CARE_PROVIDER_SITE_OTHER): Payer: Medicare Other | Admitting: *Deleted

## 2012-02-28 DIAGNOSIS — K55059 Acute (reversible) ischemia of intestine, part and extent unspecified: Secondary | ICD-10-CM

## 2012-02-28 DIAGNOSIS — K55069 Acute infarction of intestine, part and extent unspecified: Secondary | ICD-10-CM

## 2012-02-28 LAB — POCT INR: INR: 2.9

## 2012-02-28 NOTE — Telephone Encounter (Signed)
I spoke with spouse, she stated pt leg is better. They found out the problem was pt had forgotten to take his lasix x 2 days. She will have pt keep his leg elevated and no salt in his diet. She will make sure pt takes his lasix daily. She will call back if his leg worsens. But right now his leg swelling is getting back to normAL. NOTHING further was needed

## 2012-03-01 ENCOUNTER — Ambulatory Visit (INDEPENDENT_AMBULATORY_CARE_PROVIDER_SITE_OTHER): Payer: Medicare Other | Admitting: *Deleted

## 2012-03-01 DIAGNOSIS — Z95810 Presence of automatic (implantable) cardiac defibrillator: Secondary | ICD-10-CM | POA: Diagnosis not present

## 2012-03-01 DIAGNOSIS — I2589 Other forms of chronic ischemic heart disease: Secondary | ICD-10-CM | POA: Diagnosis not present

## 2012-03-13 ENCOUNTER — Other Ambulatory Visit: Payer: Self-pay | Admitting: Dermatology

## 2012-03-13 DIAGNOSIS — L821 Other seborrheic keratosis: Secondary | ICD-10-CM | POA: Diagnosis not present

## 2012-03-13 DIAGNOSIS — C4370 Malignant melanoma of unspecified lower limb, including hip: Secondary | ICD-10-CM | POA: Diagnosis not present

## 2012-03-13 DIAGNOSIS — L578 Other skin changes due to chronic exposure to nonionizing radiation: Secondary | ICD-10-CM | POA: Diagnosis not present

## 2012-03-13 DIAGNOSIS — L570 Actinic keratosis: Secondary | ICD-10-CM | POA: Diagnosis not present

## 2012-03-16 LAB — REMOTE ICD DEVICE
AL IMPEDENCE ICD: 453 Ohm
BRDY-0002RA: 60 {beats}/min
BRDY-0003RA: 130 {beats}/min
DEVICE MODEL ICD: 490477
FVT: 0
LV LEAD IMPEDENCE ICD: 400 Ohm
RV LEAD IMPEDENCE ICD: 654 Ohm
TZAT-0001FASTVT: 1
TZAT-0002FASTVT: NEGATIVE
TZAT-0013FASTVT: 2
TZAT-0018FASTVT: NEGATIVE
TZAT-0018FASTVT: NEGATIVE
TZST-0001FASTVT: 3
TZST-0001FASTVT: 4
TZST-0001FASTVT: 7
TZST-0001FASTVT: 8
TZST-0003FASTVT: 23 J
TZST-0003FASTVT: 41 J
TZST-0003FASTVT: 41 J
VF: 0

## 2012-03-27 ENCOUNTER — Ambulatory Visit (INDEPENDENT_AMBULATORY_CARE_PROVIDER_SITE_OTHER): Payer: Medicare Other | Admitting: *Deleted

## 2012-03-27 ENCOUNTER — Encounter: Payer: Self-pay | Admitting: *Deleted

## 2012-03-27 DIAGNOSIS — K55069 Acute infarction of intestine, part and extent unspecified: Secondary | ICD-10-CM

## 2012-03-27 DIAGNOSIS — K55059 Acute (reversible) ischemia of intestine, part and extent unspecified: Secondary | ICD-10-CM | POA: Diagnosis not present

## 2012-04-02 ENCOUNTER — Encounter: Payer: Self-pay | Admitting: Internal Medicine

## 2012-04-11 ENCOUNTER — Other Ambulatory Visit: Payer: Self-pay | Admitting: Dermatology

## 2012-04-11 DIAGNOSIS — D485 Neoplasm of uncertain behavior of skin: Secondary | ICD-10-CM | POA: Diagnosis not present

## 2012-04-11 DIAGNOSIS — C4370 Malignant melanoma of unspecified lower limb, including hip: Secondary | ICD-10-CM | POA: Diagnosis not present

## 2012-04-18 ENCOUNTER — Encounter: Payer: Self-pay | Admitting: Pulmonary Disease

## 2012-04-18 ENCOUNTER — Ambulatory Visit (INDEPENDENT_AMBULATORY_CARE_PROVIDER_SITE_OTHER): Payer: Medicare Other | Admitting: Pulmonary Disease

## 2012-04-18 VITALS — BP 110/70 | HR 54 | Temp 98.6°F | Ht 72.0 in | Wt 207.8 lb

## 2012-04-18 DIAGNOSIS — I2510 Atherosclerotic heart disease of native coronary artery without angina pectoris: Secondary | ICD-10-CM

## 2012-04-18 DIAGNOSIS — K55059 Acute (reversible) ischemia of intestine, part and extent unspecified: Secondary | ICD-10-CM

## 2012-04-18 DIAGNOSIS — K5090 Crohn's disease, unspecified, without complications: Secondary | ICD-10-CM

## 2012-04-18 DIAGNOSIS — K55069 Acute infarction of intestine, part and extent unspecified: Secondary | ICD-10-CM

## 2012-04-18 DIAGNOSIS — Z95810 Presence of automatic (implantable) cardiac defibrillator: Secondary | ICD-10-CM

## 2012-04-18 DIAGNOSIS — I2589 Other forms of chronic ischemic heart disease: Secondary | ICD-10-CM | POA: Diagnosis not present

## 2012-04-18 DIAGNOSIS — D126 Benign neoplasm of colon, unspecified: Secondary | ICD-10-CM

## 2012-04-18 DIAGNOSIS — D696 Thrombocytopenia, unspecified: Secondary | ICD-10-CM

## 2012-04-18 DIAGNOSIS — G4733 Obstructive sleep apnea (adult) (pediatric): Secondary | ICD-10-CM

## 2012-04-18 DIAGNOSIS — F411 Generalized anxiety disorder: Secondary | ICD-10-CM

## 2012-04-18 DIAGNOSIS — I779 Disorder of arteries and arterioles, unspecified: Secondary | ICD-10-CM

## 2012-04-18 DIAGNOSIS — K219 Gastro-esophageal reflux disease without esophagitis: Secondary | ICD-10-CM

## 2012-04-18 DIAGNOSIS — M1990 Unspecified osteoarthritis, unspecified site: Secondary | ICD-10-CM

## 2012-04-18 DIAGNOSIS — I10 Essential (primary) hypertension: Secondary | ICD-10-CM

## 2012-04-18 DIAGNOSIS — E785 Hyperlipidemia, unspecified: Secondary | ICD-10-CM

## 2012-04-18 DIAGNOSIS — R259 Unspecified abnormal involuntary movements: Secondary | ICD-10-CM

## 2012-04-18 DIAGNOSIS — R251 Tremor, unspecified: Secondary | ICD-10-CM

## 2012-04-18 DIAGNOSIS — N138 Other obstructive and reflux uropathy: Secondary | ICD-10-CM

## 2012-04-18 MED ORDER — EPLERENONE 25 MG PO TABS: 25.0000 mg | ORAL_TABLET | Freq: Every day | ORAL | Status: DC

## 2012-04-18 NOTE — Patient Instructions (Addendum)
Today we updated your med list in our EPIC system...     We decided to STOP your ALDACTONE (Spironolactone) due to the side effect,    And START the new EPLERENONE (Inspra) 49m one tab daily...  Call for any questions...  Let's plan a follow up visit in 4-6 months w/ FASTING blood work at that time..Marland KitchenMarland Kitchen

## 2012-04-24 ENCOUNTER — Encounter (INDEPENDENT_AMBULATORY_CARE_PROVIDER_SITE_OTHER): Payer: Medicare Other

## 2012-04-24 ENCOUNTER — Ambulatory Visit (INDEPENDENT_AMBULATORY_CARE_PROVIDER_SITE_OTHER): Payer: Medicare Other | Admitting: *Deleted

## 2012-04-24 DIAGNOSIS — I6529 Occlusion and stenosis of unspecified carotid artery: Secondary | ICD-10-CM

## 2012-04-24 DIAGNOSIS — K55059 Acute (reversible) ischemia of intestine, part and extent unspecified: Secondary | ICD-10-CM

## 2012-04-24 DIAGNOSIS — K55069 Acute infarction of intestine, part and extent unspecified: Secondary | ICD-10-CM

## 2012-04-25 DIAGNOSIS — C4370 Malignant melanoma of unspecified lower limb, including hip: Secondary | ICD-10-CM | POA: Diagnosis not present

## 2012-05-01 ENCOUNTER — Telehealth: Payer: Self-pay | Admitting: Pulmonary Disease

## 2012-05-01 NOTE — Telephone Encounter (Signed)
Called and spoke with [facility]ist at [facility] and he is aware per the fax that was sent back this morning that SN stopped the pts spironlactone.  Nothing further is needed.

## 2012-05-02 ENCOUNTER — Encounter: Payer: Self-pay | Admitting: Cardiology

## 2012-05-04 DIAGNOSIS — H18419 Arcus senilis, unspecified eye: Secondary | ICD-10-CM | POA: Diagnosis not present

## 2012-05-04 DIAGNOSIS — H2510 Age-related nuclear cataract, unspecified eye: Secondary | ICD-10-CM | POA: Diagnosis not present

## 2012-05-07 NOTE — Progress Notes (Signed)
Subjective:    Patient ID: [name], male    DOB: 07/02/1931, 79 y.o.   MRN: [id number]  HPI 79 y/o WM here for a follow up visit... he has multiple medical problems as noted below...    ~  [place]owdhury [place] w/ sm bowel Crohn's- s/p mult resections w/ complications, stable on 6-MP for prevention of recurrence... also has sl incr fasting bili c/w Gilbert's... ~  followed by [name] for Cards- w/ CAD, s/p CABG, Cardiomyopathy w/ AICD- stable... f/u 2DEcho 11/09 showed diffuseHK w/ EF= 35% & DD, mod incr AoV thickness w/o AS... EKG= pacer rhythm.  ~  November 24, 2010:  43moROV & he has been generally stable, notes some discomfort in his lower back & left hip area w/ occas radiation down left leg; we discussed heat, Advil, exercises, and the need for Ortho eval if the discomfort persists or worsens...     HBP, CAD, Cardiomyop> on Coreg6.25Bid, Lisinopril10, Lasix40, Spironolactone25 & Coumadin; BP= 126/68 & he denies CP, palpit, dizzy, ch in SOB, edema, etc; he saw DrKlein 7/12- felt to be stable on meds & defibrillator interrogated, no changes made...    Chol> on Simva20 & f/u FLP looks good, at goal, continue same...    GI> Gerd, Polyps, Crohn's> on 6MP [place]Chowdhury [place]; stable, continue same rx...    Hx intestinal vasc insuffic 2003> he remains on Coumadin followed in the [facility] & stable...    GU> BPH followed by DrNesi & prev on Flomax but he stopped & states voiding is satis etc...    DJD/ ?PMR> off Pred, he states he read where this "comes from crohn's" he says; see above & he will try Heat, Advil, Tylenol...    Tremor/ Anxiety> [place] [facility] Dr[place], but he stopped on his own...  ~  May 25, 2011:  44m50moV & [name] c/o weakness, fatigue, lack of energy; also notes freq nosebleeds on & off, not severe & rec to use Saline Q1-2H while awake; on Coumadin followed in the [facility] & requiring large doses (15-42m64m...     He was seen 3/13 by TP  for post hosp visit after Adm for SBO>  Disch summary, XRays, & LABS reviewed in detail w/ pt & wife...    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 Last nuclear study 10/2009: Extensive scar anteroseptal and apical walls, no ischemia. Last echo 12/2009: EF 35%, mild MR. Carotid Dopplers 2/2012Colon 0-39% bilateral> No med changes initiated...    [place]; [facility] that [facility] think his 6MP has caused some blood abnormality (notes pending)...  LABS 4/13:  Chems- wnl x sl incr TBili=2.5 SGOT=45;  CBC- ok x WBC=4.3 Plat=70K;  Fe=93 (32%);  TSH=1.63;  B12=812;  Protime= way off 7 referred to CC stat.  ~  November 30, 2011:  44mo 32mo& [name]/o a URI/ cold/ sinus infection w/ congestion/ drainage/ beige sput/ etc; we discussed ZPak & Saline for treatment... We reviewed the following medical problems during today's office visit>>     OSA> eval by DrClance 8/13 & started on CPAP but having some interface problems & they are  checking download for compliance etc...    HBP> on Coreg6.25Bid, Lisin10, Lasix40-1.5/d, Aldactone25; BP= 116/72 & he denies any CP, palpit, ch in SOB/ edema/ etc...    CAD, Cardiomyopathy> on ASA81 & Coumadin; s/p CABG 1994, AICD [facility]13 & Lasix increased to 69m/d; last seen by DrKlein 7/13 & ICD functioning normally...    Chol> on Simva20; not fasting today & his last FLP 10/12 showed TChol 124, TG 109, HDL 49, LDL 54    GI- GERD, Polyps> stable & he currently denies abd pain, n/v, c/d, blood seen...    Crohn's dis, Hx SBO> he is [place] DrHolmes, [name] & [place] have stopped his 6MP; Crohn's has been stable since then 7 he  indicated he may start a biologic med in the future...    Hx mesenteric vasc insuffic w/ embolectomy> on Coumadin & monitored in the CC, stable...    BPH> aware- prev checked by drNesi, PSAs have been wnl...    DJD, PMR> on B12, VitD, Folate; stable & not requiring pain meds or Pred...    Tremor> eval by DDalphine Handingin the past w/ trial of Clonazepam w/ some improvement...    Anxiety> not currently on Klonopin etc...    [place] ct> [place]; Plats ranged 67-73K & they stopped his 6MP Rx for crohn's dis... We reviewed prob list, meds, xrays and labs> see below for updates >>   ~  April 18, 2012:  [place] several complaints/ concerns as listed below>>      C/o an URI w/ head congestion, drainage, etc but denies f/c/s, cough/ sput/ SOB & we discussed OTC meds to use prn...    BP controlled on meds> Coreg6.25Bid, Lisin10, Lasix40-1.5/d, Aldactone25; BP= 110/70 & he denies CP, palpit, SOB, etc...    He is c/o bilat breast swelling, sore & tender; he is on Aldactone25 & this is the likely culprit; we will switch to Eplerenone25...    He remains on Simva20 for his Chol & last FLP was 10/12 w/ TChol 124, TG 109, HDL 49, LDL 54; he needs to ret fasting for f/u labs...    He saw DrHolmes GI [place] f/u Crohn's 12/13> stable on Cholestyramine 4gm/d...    Wife says he's grumpy; offered med rx- SSRI etc but he declines...     He reports a Melanoma removed from left calf area recently w/ some post op swelling; path showed superfic spreading melanoma & it is being managed by DrLupton w/ Bx then wide excision We reviewed prob list, meds, xrays and labs> see below for updates >> he remains on VitD, B12, & Folate...          Problem List:  OSA >> He had a sleep study w/ AHI=34- loud snoring & abnormal breathing pattern noted by wife; pt notes freq awakenings & not feeling rested in the AM; but he denied issues w/ daytime sleepiness & his  ESS= only1... Started on CPAP & they are following up...  HBP, CAD, Cardiomyopathy - followed by DrBenay Spicend DrKlein... he had CABG in 1994 (with cath in 96 revealing a total right and an 80% OM, vein graft to the diagonal was occluded, other bypasses were patent), Biv AICD in 2006...  On COUMADIN (follow in clinic), ASA 8170m,  COREG 6.83m47m,  LISINOPRIL 20mg35m  LASIX 75m/d,  SPIRONOLACTONE 292md... ~  Cardiolite 3/03 w/ EF=37%...  ~  Cath 6/06 w/ native vessel and graft disease...  ~  2DEcho 11/09 stable w/ mod LVD- diffuse HK w/ EF= 35%, DD, mod incr AoV thickness w/o AS...  ~  3/11:  CXR showed stable CABG, AICD, & chr changes... ~  10/11:  Myoview showed extensive scar, no ischemia, EF= 34% ~  10/11:  New AICD placed by DrKlein... ~  11/11:  2DEcho showed mod reduced LV sys function w/ EF= 30-35%, diffuse HK, gr1 DD> see report... ~  10/12:  BP= 126/68 & stable from the CV perspective... ~  4/13:  He's had f/u w/ Cards> DrKatz et al; notes reviewed, no med changes other than Coumadin regulation... ~  7/13:  His AICD was interrogated by DrKlein & no problems identified... ~  8/13:  2DEcho showed mild LVH, mod decr LVF w/ EF=35-40% w/ HK of the entire myocardium, Gr1DD, mild AoV sclerosis, mild MR, normal RV... ~  8/13:  VenDopplers of LEs ordered by Cards- neg for DVT ~  10/13:  He had f/u DrKatz who noted edema is diminished on the Lasix60... ~  10/13: on Coreg6.25Bid, Lisin10, Lasix40-1.5/d, Aldactone25; BP= 116/72 & he denies any CP, palpit, ch in SOB/ edema/ etc... ~  3/14:  BP controlled on meds> Coreg6.25Bid, Lisin10, Lasix40-1.5/d, Aldactone25; BP= 110/70 & he denies CP, palpit, SOB, etc; but he has developed gynecomastia from the aldactone & we will switch to Eplerenone25...  HYPERLIPIDEMIA - he takes SIMVASTATIN 2070m... he's had min, non-progressive incr LFTs noted. ~  FLPKerr08 showed TChol 138, TG 138, HDL 38, LDL 72... tolerating med well. ~  FLPCenter09 showed TChol 118, TG  111, HDL 33, LDL 63 ~  FLP 12/09 showed TChol 118, TG 100, HDL 31, LDL 67 ~  FLP 10/10 on Simva20 showed TChol 111, TG 139, HDL 36, LDL 47 ~  FLP 10/11 on Simva20 showed TChol 101, TG 102, HDL 35, LDL 45 ~  FLP 10/12 on Simva20 showed TChol 124, TG 109, HDL 49, LDL 54 ~  FLP 10/13 & 3/14> pt wasn't fasting for blood work today...  GERD (ICD-530.81) - no active symptoms and not currently taking PPI etc...  COLONIC POLYPS (ICD-211.3) - last colonoscopy here was 7/04 by DrKDemetra Shiner divertics and several polyps from 6 - 33m49mze, reportedly adenomatous on bx but I cannot find the path reports... he is overdue for f/u colon... ~  labs 2/10 showed Hg= 15.7... wMarland KitchenMarland Kitchenwill send copy of note to DrChowdhury w/ request for f/u colonoscopy. ~  f/u colonoscopy 11/10 by DrChowdhury showed norm TI & several polyps removed- path +tubular adenoma...  CROHN'S DISEASE (ICD-555.9) - he [place]Chowdhury [place] 6-MP 50mg64ms- 1.5 tabs/d... he notes intermittent diarrhea and was started on Questran Prn... ~  labs 3/11 noted sl incr LFTs, Protime, & Sed... ~  labs 10/11 showed norm chems & LFTs x incr bili, Plat=88K, BNP=219, otherw norm. ~  Labs 10/12 showed norm chems, SGOT=59, TBili=3.9, Plat=85K... ~  [facility]> off 6MP due to thrombocytopenia & doing satis, considering a biologic if needed, considering repeat colonoscopy... ~  [facility]> doing satis on Cholestyramine4gm daily, no changes made...  Hx of ACUTE VASCULAR INSUFFICIENCY OF INTESTINE (ICD-557.0) - he has embolectomy of the superior mesenteric artery 1/03 by DrLawSheryn Bisong hospitalization w/ complications)... he has remained on COUMADIN in the coumadin clinic ever since then...  VENTRAL  HERNIA (ICD-553.20) - he is s/p left inguinal hernia repair 6/08 by Clarita Leber, and had a prev incarcerated ventral hernia repaired 11/03... his residual/ recurrent ventral hernia has been evaluated by Clarita Leber who feels that he would  need eval in [place] if this hernia comes to surg... currently he wears an abd binder when up and about...  SBO >> Hosp 3/14 - 04/19/11 by [facility] w/ SBO that opened up w/o surgical intervention... ~  CT Abd 3/13 showed early SBO pattern from an anterior wall hernia defect; ?GE varicies noted, s/p GB, hepatic steatosis, calcif granulomas in liver, cyst in right kidney, atherosclerotic ulcer in infrarenal Ao, ectasia of iliacs...  HYPERTROPHY PROSTATE W/UR OBST & OTH LUTS (ICD-600.01) - saw DrNesi 7/10 w/ BPH, obstructive symptoms, and ED- given  Flomax & "shots"...  ~  Labs 10/12 showed PSA= 2.93  DEGENERATIVE JOINT DISEASE (ICD-715.90)  Hx of POLYMYALGIA RHEUMATICA (ICD-725) - hasn't req steroids x yrs... ~  labs 2/10 showed Sed= 8 ~  labs 10/10 showed Sed= 6 ~  labs 3/11 showed Sed= 51 w/ acute illness. ~  labs 10/11 showed Sed= 9 ~  Labs 10/12 showed Sed= 9  TREMOR (ICD-781.0) - [facility]- essential tremor w/ +FamHx in sister... trial Clonazepam 0.29m Bid w/ improvement...  ANXIETY (ICD-300.00) - he uses the same Clonazepam 0.268mPrn...  Hx of SHINGLES (ICD-053.9)  Thrombocytopenia >> serial labs w/ Plat counts betw 223 ==> 85; no bleeding diathesis, & he had acute intestinal vasc insuffic 2003 requiring embolectomy & has been on Coumadin Rx ever since then... ~    VITAMIN B12 DEFICIENCY (ICD-266.2) - on B 12 10009md OTC... ~  labs 2/10 showed B12 level = 215... rec start oral B12 supplement daily. ~  labs 10/10 showed B12 level = 416... continue oral B12 supplement. ~  labs 10/11 showed B12= 592... continue same. ~  Labs 10/12 showed B12= 573  VITAMIN D DEFICIENCY (ICD-268.9) - on Vit D 1000 u daily OTC... ~  labs 2/10 showed Vit D level = 23... rec> start Vit D 1000 u daily.. ~  Labs 10/12 showed Vit D level = 50   Past Surgical History  Procedure Laterality Date  . Coronary artery bypass graft  1994  . Laparoscopic cholecystectomy  2002  .  Elap w/ superior mesenteric artery embolectomy  1/03    by Dr. LawJennette Banker Repair of incarcerated ventral hernia and lysis of adhesions  11/03    by Dr. HinBetsy Pries Implantation of biventric cardioverter-defibrillatorr      by Dr. TayLovena Le 8/06 [place] [place]  . Implantation of guidant cognis device      model [id number] Left inguinal hernia repair with mesh  6/08    by Dr. HinBetsy Pries Outpatient Encounter Prescriptions as of 04/18/2012  Medication Sig Dispense Refill  . aspirin 81 MG tablet Take 81 mg by mouth daily.        . carvedilol (COREG) 6.25 MG tablet Take 1 tablet (6.25 mg total) by mouth 2 (two) times daily with a meal.  180 tablet  2  . cholecalciferol (VITAMIN D) 1000 UNITS tablet Take 1,000-2,000 Units by mouth daily.       . Cyanocobalamin (B-12) 1000 MCG CAPS Take 1 capsule by mouth daily.        . folic acid (FOLVITE) 1 MG tablet Take 1 mg by mouth daily.        . furosemide (LASIX) 40  MG tablet Take 1.5 tablets (60 mg total) by mouth daily.  135 tablet  2  . lisinopril (PRINIVIL,ZESTRIL) 10 MG tablet Take 1 tablet (10 mg total) by mouth daily.  90 tablet  2  . simvastatin (ZOCOR) 20 MG tablet Take 1 tablet (20 mg total) by mouth at bedtime.  90 tablet  2  . warfarin (COUMADIN) 5 MG tablet Take 1 tablet (5 mg total) by mouth as directed.  150 tablet  1  . [DISCONTINUED] spironolactone (ALDACTONE) 25 MG tablet Take 1 tablet (25 mg total) by mouth daily.  90 tablet  2  . eplerenone (INSPRA) 25 MG tablet Take 1 tablet (25 mg total) by mouth daily.  90 tablet  3  . [DISCONTINUED] azithromycin (ZITHROMAX) 250 MG tablet Take 1 tablet (250 mg total) by mouth as directed.  6 tablet  0   No facility-administered encounter medications on file as of 04/18/2012.    No Known Allergies   Current Medications, Allergies, Past Medical History, Past Surgical History, Family History, and Social History were reviewed in [employer] record.     Review of Systems         See HPI - all other systems neg except as noted... The patient complains of decreased hearing and dyspnea on exertion.  The patient denies anorexia, fever, weight loss, weight gain, vision loss, hoarseness, chest pain, syncope, peripheral edema, prolonged cough, headaches, hemoptysis, abdominal pain, melena, hematochezia, severe indigestion/heartburn, hematuria, incontinence, muscle weakness, suspicious skin lesions, transient blindness, difficulty walking, depression, unusual weight change, abnormal bleeding, enlarged lymph nodes, and angioedema.     Objective:   Physical Exam     WD, WN, 79 y/o WM in NAD... GENERAL:  Alert & oriented; pleasant & cooperative... HEENT:  [place]/AT, EOM-wnl, PERRLA, EACs-clear, TMs-wnl, NOSE- sl red/ inflamm, THROAT-clear & wnl. NECK:  Supple w/ fairROM; no JVD; normal carotid impulses w/o bruits; no thyromegaly or nodules palpated; no lymphadenopathy. CHEST:  Decr BS w/ few scat rhonchi, no wheezing, no rales, no signs of consolidation. HEART:  Regular Rhythm; gr 1/6 SEM without rubs or gallops detected... ABDOMEN:  Soft & nontender; mult scars, & ventral hernia; normal bowel sounds; no organomegaly or masses palpated. EXT: without deformities, mod arthritic changes; no varicose veins/ venous insuffic/ or edema. NEURO:  CN's intact; I do not apprec a tremor at present; no focal neuro deficits... DERM:  No lesions noted; no rash etc...  RADIOLOGY DATA:  Reviewed in the EPIC EMR & discussed w/ the patient...  LABORATORY DATA:  Reviewed in the EPIC EMR & discussed w/ the patient...   Assessment & Plan:    HBP>  BP controlled, continue same meds, except change from Aldactone to Eplerenone, and try to incr activity...  CAD/ Cardiomyopathy>  Followed by [name] & DrKlein, their notes are reviewed...  Hyperlipid>  On Simva20 + diet> continue same...  GI>  Followed [place] now by [facility] as DrChowdhury has moved to [facility]; continue  meds & close f/u w/ GI; now off 6MP rx...  Hx vasc insuffic intestines>  SMA embolectomy 2003, he remains on Coumadin via the CC...  Hx SBO requiring Hosp 3/13 but resolved w/ conserv approach per DrHIngram, [facility]...  Other medical problems as noted...   Patient's Medications  New Prescriptions   EPLERENONE (INSPRA) 25 MG TABLET    Take 1 tablet (25 mg total) by mouth daily.  Previous Medications   ASPIRIN 81 MG TABLET    Take 81 mg by mouth daily.  CARVEDILOL (COREG) 6.25 MG TABLET    Take 1 tablet (6.25 mg total) by mouth 2 (two) times daily with a meal.   CHOLECALCIFEROL (VITAMIN D) 1000 UNITS TABLET    Take 1,000-2,000 Units by mouth daily.    CYANOCOBALAMIN (B-12) 1000 MCG CAPS    Take 1 capsule by mouth daily.     FOLIC ACID (FOLVITE) 1 MG TABLET    Take 1 mg by mouth daily.     FUROSEMIDE (LASIX) 40 MG TABLET    Take 1.5 tablets (60 mg total) by mouth daily.   LISINOPRIL (PRINIVIL,ZESTRIL) 10 MG TABLET    Take 1 tablet (10 mg total) by mouth daily.   SIMVASTATIN (ZOCOR) 20 MG TABLET    Take 1 tablet (20 mg total) by mouth at bedtime.   WARFARIN (COUMADIN) 5 MG TABLET    Take 1 tablet (5 mg total) by mouth as directed.  Modified Medications   No medications on file  Discontinued Medications   AZITHROMYCIN (ZITHROMAX) 250 MG TABLET    Take 1 tablet (250 mg total) by mouth as directed.   SPIRONOLACTONE (ALDACTONE) 25 MG TABLET    Take 1 tablet (25 mg total) by mouth daily.

## 2012-05-08 ENCOUNTER — Telehealth: Payer: Self-pay | Admitting: Pulmonary Disease

## 2012-05-08 NOTE — Telephone Encounter (Signed)
I called [facility] to see why pt still has not received inspra. Was advised there is a drug interaction b/w this and lisinopril. It causes increase potassium level in blood and may cause cardiac arrest. Will need to call back to override this if SN approves this. Ref #: [id number]. Please advise SN thanks

## 2012-05-08 NOTE — Telephone Encounter (Signed)
Per SN---  Please call back and override this.  Pt is intolerant to aldactone.  thanks

## 2012-05-08 NOTE — Telephone Encounter (Signed)
I spoke with audry and gave VO. She will get this ready for pt.  lmtcb x1 for spouse to make her aware

## 2012-05-09 ENCOUNTER — Ambulatory Visit: Payer: Medicare Other | Admitting: Cardiology

## 2012-05-09 NOTE — Telephone Encounter (Signed)
Pt returned call. [name]  

## 2012-05-09 NOTE — Telephone Encounter (Signed)
lmomtcb x1 

## 2012-05-09 NOTE — Telephone Encounter (Signed)
LMTCBx2 with pt to advise. [place] Bing, CMA

## 2012-05-10 NOTE — Telephone Encounter (Signed)
Pt's wife returned triage's call.  Advised that our office spoke [facility] & gave a verbal order for the inspra & that Ardine Eng states taht she will get this ready for pt.  Pt's wife verbalized understanding & states that nothing further is needed at this time. Satira Anis

## 2012-05-17 ENCOUNTER — Ambulatory Visit (INDEPENDENT_AMBULATORY_CARE_PROVIDER_SITE_OTHER): Payer: Medicare Other | Admitting: Cardiology

## 2012-05-17 ENCOUNTER — Encounter: Payer: Self-pay | Admitting: Cardiology

## 2012-05-17 VITALS — BP 130/70 | HR 65 | Ht 72.0 in | Wt 206.0 lb

## 2012-05-17 DIAGNOSIS — I779 Disorder of arteries and arterioles, unspecified: Secondary | ICD-10-CM

## 2012-05-17 DIAGNOSIS — I2510 Atherosclerotic heart disease of native coronary artery without angina pectoris: Secondary | ICD-10-CM

## 2012-05-17 DIAGNOSIS — R609 Edema, unspecified: Secondary | ICD-10-CM

## 2012-05-17 DIAGNOSIS — N644 Mastodynia: Secondary | ICD-10-CM

## 2012-05-17 DIAGNOSIS — I10 Essential (primary) hypertension: Secondary | ICD-10-CM | POA: Diagnosis not present

## 2012-05-17 DIAGNOSIS — R600 Localized edema: Secondary | ICD-10-CM

## 2012-05-17 DIAGNOSIS — Z7901 Long term (current) use of anticoagulants: Secondary | ICD-10-CM

## 2012-05-17 DIAGNOSIS — I2589 Other forms of chronic ischemic heart disease: Secondary | ICD-10-CM

## 2012-05-17 LAB — BASIC METABOLIC PANEL
BUN: 9 mg/dL (ref 6–23)
GFR: 69.36 mL/min (ref >60.00)
Glucose, Bld: 92 mg/dL (ref 70–99)
Potassium: 4.4 mEq/L (ref 3.5–5.1)

## 2012-05-17 NOTE — Assessment & Plan Note (Signed)
The patient is definitely improved with this change from spironolactone to eplerinone. Potassium will be checked today to see if there's been any variation.

## 2012-05-17 NOTE — Assessment & Plan Note (Signed)
Coronary disease is stable. No further workup needed.

## 2012-05-17 NOTE — Assessment & Plan Note (Signed)
He continues on Coumadin. He is scheduled to have some cataract surgery. I spoke with the patient about this. It is my understanding that this is to be done on Coumadin. He is going to be checking with his eye doctor

## 2012-05-17 NOTE — Progress Notes (Signed)
HPI   Patient returns today for followup of cardiomyopathy and fluid overload. He is actually doing well. He had some breast tenderness. His spironolactone was changed to eplerinone. He seems to be doing well with this. He's not having any chest pain or shortness of breath. He's had a mild increase in the edema in his right lower extremity. He has some chronic swelling in his left leg.  No Known Allergies  Current Outpatient Prescriptions  Medication Sig Dispense Refill  . aspirin 81 MG tablet Take 81 mg by mouth daily.        . carvedilol (COREG) 6.25 MG tablet Take 1 tablet (6.25 mg total) by mouth 2 (two) times daily with a meal.  180 tablet  2  . cholecalciferol (VITAMIN D) 1000 UNITS tablet Take 1,000-2,000 Units by mouth daily.       . Cyanocobalamin (B-12) 1000 MCG CAPS Take 1 capsule by mouth daily.        [name] eplerenone (INSPRA) 25 MG tablet Take 1 tablet (25 mg total) by mouth daily.  90 tablet  3  . folic acid (FOLVITE) 1 MG tablet Take 1 mg by mouth daily.        . furosemide (LASIX) 40 MG tablet Take 1.5 tablets (60 mg total) by mouth daily.  135 tablet  2  . lisinopril (PRINIVIL,ZESTRIL) 10 MG tablet Take 1 tablet (10 mg total) by mouth daily.  90 tablet  2  . simvastatin (ZOCOR) 20 MG tablet Take 1 tablet (20 mg total) by mouth at bedtime.  90 tablet  2  . warfarin (COUMADIN) 5 MG tablet Take 1 tablet (5 mg total) by mouth as directed.  150 tablet  1   No current facility-administered medications for this visit.    History   Social History  . Marital Status: Married    Spouse Name: Kennyth Lose    Number of Children: 3  . 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 Smoking status: Former Smoker -- 1.00 packs/day for 20 years    Types: Cigarettes    Quit date: 01/31/1973  . Smokeless tobacco: Never Used  . Alcohol Use: Yes     Comment: occasional  . Drug Use: No  . Sexually Active: Not  on file   Other Topics Concern  . Not on file   Social History Narrative   Lives in [place], [place] with wife.     Family History  Problem Relation Age of Onset  . Heart disease Mother   . Hypertension    . Hyperlipidemia    . Tremor Sister   . Heart disease Father 10    Deceased   . Cancer Brother     Past Medical History  Diagnosis Date  . Dizziness     Positional dizziness  . Hypertension   . Ischemic cardiomyopathy     CABG 1994 CAD catherization 1996.[name] occluded vein graft to the diagnol, other grafts patent/  catherization 2006, no PCI/ nuclear.[name] october 2011.[name] extensive scar anteroseptal and apical.. no ischemia    . CHF (congestive heart failure)     chronic systolic  . Mitral regurgitation     mild.[name] echo november 2011  EF 35%...echo.. november 2009/ ef 35%.[name] echo november 2011  . Sinus bradycardia   . Chronotropic incompetence   . Biventricular implantable cardiac defibrillator in situ  GDT CONTAK H170-MADIT-CRT-EXPLANTED 2011 implanted defibrillator- [employer] cognis, model n119-2001 Dr. Caryl Comes (11/28/2009)  . LBBB (left bundle branch block)   . Hyperlipemia   . GERD (gastroesophageal reflux disease)   . Colonic polyp   . Crohn's disease   . Acute vascular insufficiency of intestine     Mesenteric embolus...surgery 2003  . Ventral hernia   . Hypertrophy of prostate with urinary obstruction and other lower urinary tract symptoms (LUTS)   . Degenerative joint disease   . Polymyalgia rheumatica   . Tremor   . Anxiety   . Shingles   . Vitamin B12 deficiency   . CAD (coronary artery disease)     CABG 1994  /   catheterization 1996, occluded vein graft to the diagonal, other grafts patent  /   catheterization 2006, no PCI  /  nuclear, October, 2011, extensive scar anteroseptal and apical, no ischemia  . Hx of CABG     1994  . Ejection fraction     EF 35%, echo, November, 2011  //   Is 35-40%, echo,  September 23, 2011  . Warfarin anticoagulation     Coumadin  therapy for mesenteric embolus 2003  . Carotid artery disease     Doppler, February,  2012, 0-39% bilateral, recommended followup 1 year  . Edema leg     Venous Dopplers 8/13: No DVT bilaterally  . Breast tenderness     Spironolactone was stopped and eplerinone started. Patient feels better    Past Surgical History  Procedure Laterality Date  . Coronary artery bypass graft  1994  . Laparoscopic cholecystectomy  2002  . Elap w/ superior mesenteric artery embolectomy  1/03    by Dr. Jennette Banker  . Repair of incarcerated ventral hernia and lysis of adhesions  11/03    by Dr. Betsy Pries  . Implantation of biventric cardioverter-defibrillatorr      by Dr. Lovena Le in 8/06 [employer] [place] 2011  . Implantation of guidant cognis device      model [id number]  . Left inguinal hernia repair with mesh  6/08    by Dr. Betsy Pries    Patient Active Problem List  Diagnosis  . SHINGLES  . COLONIC POLYPS  . VITAMIN B12 DEFICIENCY  . VITAMIN D DEFICIENCY  . ANXIETY  . CARDIOMYOPATHY, ISCHEMIC  s/p CABG    . LBBB  . BRONCHITIS, ACUTE  . SINUSITIS  . GERD  . ACUTE VASCULAR INSUFFICIENCY OF INTESTINE  . HYPERTROPHY PROSTATE W/UR OBST & OTH LUTS  . DEGENERATIVE JOINT DISEASE  . POLYMYALGIA RHEUMATICA  . FATIGUE  . IMPLANTABLE DEFIBRILLATOR, GDT CONTAK H170-MADIT-CRT  . Mesenteric thrombosis  . CAD (coronary artery disease)  . Hx of CABG  . Dizziness  . Hypertension  . Chronic systolic heart failure  . Mitral regurgitation  . Sinus bradycardia  . Chronotropic incompetence  . Hyperlipemia  . Crohn's disease  . Ventral hernia  . Tremor  . Warfarin anticoagulation  . Carotid artery disease  . Abdominal pain  . SBO (small bowel obstruction)  . OSA (obstructive sleep apnea)  . Ejection fraction  . Edema leg  . Thrombocytopenia  . Breast tenderness    ROS   Patient denies fever, chills, headache, sweats, rash, change in vision, change in hearing, chest pain, cough, nausea  vomiting, urinary symptoms. All other systems are reviewed and are negative.  PHYSICAL EXAM  Patient is oriented to person time and place. Affect is normal. He is here with his family member. There is  no jugulovenous distention. Lungs are clear. Respiratory effort is nonlabored. Cardiac exam reveals S1 and S2. There no clicks or significant murmurs. The abdomen is soft. Patient has 1+ edema in the left lower extremity. There is trace edema in his right lower extremity.  Filed Vitals:   05/17/12 1157  BP: 130/70  Pulse: 65  Height: 6' (1.829 m)  Weight: 206 lb (93.441 kg)  SpO2: 96%     ASSESSMENT & PLAN

## 2012-05-17 NOTE — Assessment & Plan Note (Signed)
He has some mild chronic edema in the left leg. He may have slight increase in his right leg. He will increase his Lasix from 60 a day to 80 a day for 3 days. I've also reminded him to be careful with his salt and extra fluid intake. We may need to increase his dose to 80 mg chronically if this is a persistent problem.

## 2012-05-17 NOTE — Assessment & Plan Note (Signed)
Patient is on all the appropriate medications. I may increase some of his doses in the future. However I am changing his Lasix today and therefore we'll not make any other changes.

## 2012-05-17 NOTE — Patient Instructions (Addendum)
Your physician has recommended you make the following change in your medication: increase Furosemide to 80 mg (2 tablets) daily for 3 days then resume 60 mg daily (1 and 1/2 tablet) there after.  Your physician recommends that you return for lab work in: today  Your physician wants you to follow-up in: 6 months. You will receive a reminder letter in the mail two months in advance. If you don't receive a letter, please call our office to schedule the follow-up appointment.

## 2012-05-17 NOTE — Assessment & Plan Note (Signed)
Recent Doppler revealed that his carotids are stable.

## 2012-05-22 ENCOUNTER — Ambulatory Visit (INDEPENDENT_AMBULATORY_CARE_PROVIDER_SITE_OTHER): Payer: Medicare Other

## 2012-05-22 DIAGNOSIS — K55059 Acute (reversible) ischemia of intestine, part and extent unspecified: Secondary | ICD-10-CM | POA: Diagnosis not present

## 2012-05-22 DIAGNOSIS — K55069 Acute infarction of intestine, part and extent unspecified: Secondary | ICD-10-CM

## 2012-05-23 DIAGNOSIS — L905 Scar conditions and fibrosis of skin: Secondary | ICD-10-CM | POA: Diagnosis not present

## 2012-05-23 DIAGNOSIS — Z85820 Personal history of malignant melanoma of skin: Secondary | ICD-10-CM | POA: Diagnosis not present

## 2012-05-30 ENCOUNTER — Ambulatory Visit: Payer: Medicare Other | Admitting: Pulmonary Disease

## 2012-05-31 ENCOUNTER — Telehealth: Payer: Self-pay

## 2012-05-31 ENCOUNTER — Ambulatory Visit (INDEPENDENT_AMBULATORY_CARE_PROVIDER_SITE_OTHER): Payer: Medicare Other | Admitting: *Deleted

## 2012-05-31 ENCOUNTER — Telehealth: Payer: Self-pay | Admitting: Cardiology

## 2012-05-31 DIAGNOSIS — I2589 Other forms of chronic ischemic heart disease: Secondary | ICD-10-CM

## 2012-05-31 DIAGNOSIS — Z95810 Presence of automatic (implantable) cardiac defibrillator: Secondary | ICD-10-CM

## 2012-05-31 NOTE — Telephone Encounter (Signed)
New problem    Has some questions regarding medication

## 2012-05-31 NOTE — Telephone Encounter (Addendum)
**Note De-Identified  Obfuscation** Pts wife advised, she verbalized understanding.

## 2012-06-07 ENCOUNTER — Other Ambulatory Visit: Payer: Self-pay

## 2012-06-07 ENCOUNTER — Encounter: Payer: Self-pay | Admitting: Internal Medicine

## 2012-06-19 ENCOUNTER — Ambulatory Visit (INDEPENDENT_AMBULATORY_CARE_PROVIDER_SITE_OTHER): Payer: Medicare Other | Admitting: Pharmacist

## 2012-06-19 DIAGNOSIS — K55069 Acute infarction of intestine, part and extent unspecified: Secondary | ICD-10-CM

## 2012-06-19 DIAGNOSIS — K55059 Acute (reversible) ischemia of intestine, part and extent unspecified: Secondary | ICD-10-CM | POA: Diagnosis not present

## 2012-06-22 ENCOUNTER — Ambulatory Visit (INDEPENDENT_AMBULATORY_CARE_PROVIDER_SITE_OTHER): Payer: Medicare Other | Admitting: *Deleted

## 2012-06-22 DIAGNOSIS — Z95810 Presence of automatic (implantable) cardiac defibrillator: Secondary | ICD-10-CM

## 2012-06-22 DIAGNOSIS — I2589 Other forms of chronic ischemic heart disease: Secondary | ICD-10-CM

## 2012-06-25 DIAGNOSIS — H269 Unspecified cataract: Secondary | ICD-10-CM | POA: Diagnosis not present

## 2012-06-25 DIAGNOSIS — H2510 Age-related nuclear cataract, unspecified eye: Secondary | ICD-10-CM | POA: Diagnosis not present

## 2012-06-25 LAB — REMOTE ICD DEVICE
CHARGE TIME: 8.8 s
DEVICE MODEL ICD: 490477
HV IMPEDENCE: 56 Ohm
LV LEAD IMPEDENCE ICD: 396 Ohm
RV LEAD IMPEDENCE ICD: 610 Ohm
TOT-0006: 20140130000000
TZAT-0001FASTVT: 1
TZAT-0018FASTVT: NEGATIVE
TZST-0001FASTVT: 3
TZST-0001FASTVT: 6
TZST-0001FASTVT: 8
TZST-0003FASTVT: 41 J
TZST-0003FASTVT: 41 J

## 2012-06-26 DIAGNOSIS — H2510 Age-related nuclear cataract, unspecified eye: Secondary | ICD-10-CM | POA: Diagnosis not present

## 2012-07-02 ENCOUNTER — Encounter: Payer: Self-pay | Admitting: *Deleted

## 2012-07-13 ENCOUNTER — Telehealth: Payer: Self-pay | Admitting: Pulmonary Disease

## 2012-07-13 NOTE — Telephone Encounter (Signed)
Pt is having laser surgery on his eye at 12?00 on Monday so will not be able to make it at that time. She is wanting to know what about tuesday fro an appt? Please advise NS thanks

## 2012-07-13 NOTE — Telephone Encounter (Signed)
appt has been scheduled. Nothing further was needed

## 2012-07-13 NOTE — Telephone Encounter (Signed)
Per SN---  ?unknown what causing this, this was the leg with the melanoma surgery? He is on lasix and epelernone.   Pt will need ROV with SN or Dr. Drue Stager is not in the office today.  Can add pt on Monday 6/16 at 2:30pm.  If they want this appt.

## 2012-07-13 NOTE — Telephone Encounter (Signed)
2:30 on Tuesday will be fine unless his toes/foot gets worse then he will need to be seen in the ER/urgent care.

## 2012-07-13 NOTE — Telephone Encounter (Signed)
I spoke with spouse. She stated pt left foot/ankle still swelling (going on for a month now). She took his shoe off las tnight and 2 off his toes were purple and behind the 2 toes were purple as well. She has that foot propped up higher than normal on pillows while he is in the recliner. She is wanting to know if the coumadin could cause this or what is going on? Please advise SN thanks Last OV 04/18/12 08/21/12 pending  No Known Allergies

## 2012-07-16 DIAGNOSIS — H269 Unspecified cataract: Secondary | ICD-10-CM | POA: Diagnosis not present

## 2012-07-16 DIAGNOSIS — H2510 Age-related nuclear cataract, unspecified eye: Secondary | ICD-10-CM | POA: Diagnosis not present

## 2012-07-17 ENCOUNTER — Other Ambulatory Visit: Payer: Self-pay | Admitting: Pulmonary Disease

## 2012-07-17 ENCOUNTER — Ambulatory Visit (INDEPENDENT_AMBULATORY_CARE_PROVIDER_SITE_OTHER)
Admission: RE | Admit: 2012-07-17 | Discharge: 2012-07-17 | Disposition: A | Discharge status: Home / Self Care | Payer: Medicare Other | Source: Ambulatory Visit | Attending: Pulmonary Disease | Admitting: Pulmonary Disease

## 2012-07-17 ENCOUNTER — Ambulatory Visit (INDEPENDENT_AMBULATORY_CARE_PROVIDER_SITE_OTHER): Payer: Medicare Other | Admitting: Pulmonary Disease

## 2012-07-17 ENCOUNTER — Encounter: Payer: Self-pay | Admitting: Pulmonary Disease

## 2012-07-17 VITALS — BP 132/78 | HR 61 | Temp 98.4°F | Ht 72.0 in | Wt 206.0 lb

## 2012-07-17 DIAGNOSIS — R609 Edema, unspecified: Secondary | ICD-10-CM | POA: Diagnosis not present

## 2012-07-17 DIAGNOSIS — E785 Hyperlipidemia, unspecified: Secondary | ICD-10-CM

## 2012-07-17 DIAGNOSIS — I2589 Other forms of chronic ischemic heart disease: Secondary | ICD-10-CM

## 2012-07-17 DIAGNOSIS — R600 Localized edema: Secondary | ICD-10-CM

## 2012-07-17 DIAGNOSIS — N401 Enlarged prostate with lower urinary tract symptoms: Secondary | ICD-10-CM

## 2012-07-17 DIAGNOSIS — K219 Gastro-esophageal reflux disease without esophagitis: Secondary | ICD-10-CM

## 2012-07-17 DIAGNOSIS — I872 Venous insufficiency (chronic) (peripheral): Secondary | ICD-10-CM | POA: Diagnosis not present

## 2012-07-17 DIAGNOSIS — I10 Essential (primary) hypertension: Secondary | ICD-10-CM | POA: Diagnosis not present

## 2012-07-17 DIAGNOSIS — M79672 Pain in left foot: Secondary | ICD-10-CM

## 2012-07-17 DIAGNOSIS — I2510 Atherosclerotic heart disease of native coronary artery without angina pectoris: Secondary | ICD-10-CM

## 2012-07-17 DIAGNOSIS — M1990 Unspecified osteoarthritis, unspecified site: Secondary | ICD-10-CM

## 2012-07-17 DIAGNOSIS — M7989 Other specified soft tissue disorders: Secondary | ICD-10-CM | POA: Diagnosis not present

## 2012-07-17 DIAGNOSIS — M79609 Pain in unspecified limb: Secondary | ICD-10-CM

## 2012-07-17 DIAGNOSIS — F411 Generalized anxiety disorder: Secondary | ICD-10-CM

## 2012-07-17 DIAGNOSIS — K439 Ventral hernia without obstruction or gangrene: Secondary | ICD-10-CM

## 2012-07-17 DIAGNOSIS — K5090 Crohn's disease, unspecified, without complications: Secondary | ICD-10-CM

## 2012-07-17 DIAGNOSIS — N138 Other obstructive and reflux uropathy: Secondary | ICD-10-CM

## 2012-07-17 NOTE — Progress Notes (Signed)
Subjective:    Patient ID: [name], male    DOB: [birth year], 79 y.o.   MRN: [id number]  HPI 79 y/o WM here for a follow up visit... he has multiple medical problems as noted below...    ~  [place]owdhury [place] w/ sm bowel Crohn's- s/p mult resections w/ complications, stable on 6-MP for prevention of recurrence... also has sl incr fasting bili c/w Gilbert's... ~  followed by [name] for Cards- w/ CAD, s/p CABG, Cardiomyopathy w/ AICD- stable... f/u 2DEcho 11/09 showed diffuseHK w/ EF= 35% & DD, mod incr AoV thickness w/o AS... EKG= pacer rhythm.  ~  November 24, 2010:  42moROV & he has been generally stable, notes some discomfort in his lower back & left hip area w/ occas radiation down left leg; we discussed heat, Advil, exercises, and the need for Ortho eval if the discomfort persists or worsens...     HBP, CAD, Cardiomyop> on Coreg6.25Bid, Lisinopril10, Lasix40, Spironolactone25 & Coumadin; BP= 126/68 & he denies CP, palpit, dizzy, ch in SOB, edema, etc; he saw DrKlein 7/12- felt to be stable on meds & defibrillator interrogated, no changes made...    Chol> on Simva20 & f/u FLP looks good, at goal, continue same...    GI> Gerd, Polyps, Crohn's> on 6MP [place]Chowdhury [place]; stable, continue same rx...    Hx intestinal vasc insuffic 2003> he remains on Coumadin followed in the [facility] & stable...    GU> BPH followed by DrNesi & prev on Flomax but he stopped & states voiding is satis etc...    DJD/ ?PMR> off Pred, he states he read where this "comes from crohn's" he says; see above & he will try Heat, Advil, Tylenol...    Tremor/ Anxiety> [place] Dr[place], but he stopped on his own...  ~  May 25, 2011:  31m17moV & JoeWille Glaser c/o weakness, fatigue, lack of energy; also notes freq nosebleeds on & off, not severe & rec to use Saline Q1-2H while awake; on Coumadin followed in the [facility] & requiring large doses (15-108m10m...     He was seen 3/13 by TP  for post hosp visit after Adm for SBO>  Disch summary, XRays, & LABS reviewed in detail w/ pt & wife...    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 Last nuclear study 10/2009: Extensive scar anteroseptal and apical walls, no ischemia. Last echo 12/2009: EF 35%, mild MR. Carotid Dopplers 2/2012Colon 0-39% bilateral> No med changes initiated...    [place] think his [facility] has caused some blood abnormality (notes pending)...  LABS 4/13:  Chems- wnl x sl incr TBili=2.5 SGOT=45;  CBC- ok x WBC=4.3 Plat=70K;  Fe=93 (32%);  TSH=1.63;  B12=812;  Protime= way off 7 referred to CC stat.  ~  November 30, 2011:  31mo 108mo& [name]/o a URI/ cold/ sinus infection w/ congestion/ drainage/ beige sput/ etc; we discussed ZPak & Saline for treatment... We reviewed the following medical problems during today's office visit>>     OSA> eval by DrClance 8/13 & started on CPAP but having some interface problems & they are  checking download for compliance etc...    HBP> on Coreg6.25Bid, Lisin10, Lasix40-1.5/d, Aldactone25; BP= 116/72 & he denies any CP, palpit, ch in SOB/ edema/ etc...    CAD, Cardiomyopathy> on ASA81 & Coumadin; s/p CABG 1994, AICD placed [facility] 10/13 & Lasix increased to 74m/d; last seen by DrKlein 7/13 & ICD functioning normally...    Chol> on Simva20; not fasting today & his last FLP 10/12 showed TChol 124, TG 109, HDL 49, LDL 54    GI- GERD, Polyps> stable & he currently denies abd pain, n/v, c/d, blood seen...    Crohn's dis, Hx SBO> he [place]Holmes, [place] and [name] & [place] have stopped his 6MP; Crohn's has been stable since then 7 he  indicated he may start a biologic med in the future...    Hx mesenteric vasc insuffic w/ embolectomy> on Coumadin & monitored in the CC, stable...    BPH> aware- prev checked by drNesi, PSAs have been wnl...    DJD, PMR> on B12, VitD, Folate; stable & not requiring pain meds or Pred...    Tremor> eval by DDalphine Handingin the past w/ trial of Clonazepam w/ some improvement...    Anxiety> not currently on Klonopin etc...    [place] ct> [place]; Plats ranged 67-73K & they stopped his 6MP Rx for crohn's dis... We reviewed prob list, meds, xrays and labs> see below for updates >>   ~  April 18, 2012:  476moOEricsonas several complaints/ concerns as listed below>>      C/o an URI w/ head congestion, drainage, etc but denies f/c/s, cough/ sput/ SOB & we discussed OTC meds to use prn...    BP controlled on meds> Coreg6.25Bid, Lisin10, Lasix40-1.5/d, Aldactone25; BP= 110/70 & he denies CP, palpit, SOB, etc...    He is c/o bilat breast swelling, sore & tender; he is on Aldactone25 & this is the likely culprit; we will switch to Eplerenone25...    He remains on Simva20 for his Chol & last FLP was 10/12 w/ TChol 124, TG 109, HDL 49, LDL 54; he needs to ret fasting for f/u labs...    He saw DrHolmes GI [place] for f/u Crohn's 12/13> stable on Cholestyramine 4gm/d...    Wife says he's grumpy; offered med rx- SSRI etc but he declines...     He reports a Melanoma removed from left calf area recently w/ some post op swelling; path showed superfic spreading melanoma & it is being managed by DrLupton w/ Bx then wide excision We reviewed prob list, meds, xrays and labs> see below for updates >> he remains on VitD, B12, & Folate...  ~  July 17, 2012:  42m40moV & add-on appt at wife's request due to feet swelling L>R w/ some discoloration & pain on left foot- hurts to walk on it x 1wk;  When pressed for explanation [name] mentioned that he did in fact drop a monkey  wrench on his left foot ~1wk ago;  Exam shows ven insuff w/ some mild edema on right but left foot has several ecchymoses, 3+ edema, some redness (no broken skin etc) & tender over 5th metatarsal area (top, side, & bottom);  XRay shows marked sublux vs dislocation at 5th MTP joint, no acute fx, +for osseous demineralization, etc...   REC> refer to Ortho ASAP...Marland KitchenMarland Kitchen  Other medical issues appear stable>  BP=132/78 on his Coreg6.25Bid, Lisinopril10, Lasix40-2Qam, & Eplerenone25 (all breast tenderness has resolved);  He saw [name] 4/14- CAD, cardiomyopathy, some incr edema & Lasix incr to 80Qam;  Stable on Coumadin & his AICD is functioning normally;  Chol looks good on his diet + Simva20;  He [place] & they sent him to [facility] Hematology due to low platelets (likely due to his prev 6MP rx...            Problem List:  OSA >> He had a sleep study w/ AHI=34- loud snoring & abnormal breathing pattern noted by wife; pt notes freq awakenings & not feeling rested in the AM; but he denied issues w/ daytime sleepiness & his ESS= only1... Started on CPAP & they are following up...  HBP, CAD, Cardiomyopathy >> followed by [name] and DrKlein... he had CABG in 1994 (with cath in 96 revealing a total right and an 80% OM, vein graft to the diagonal was occluded, other bypasses were patent), Biv AICD in 2006...  On COUMADIN (follow in clinic), ASA 66m/d,  COREG 6.274mid,  LISINOPRIL 2021m,  LASIX 30m88m  SPIRONOLACTONE 25mg87m. ~  Cardiolite 3/03 w/ EF=37%...  ~  Cath 6/06 w/ native vessel and graft disease...  ~  2DEcho 11/09 stable w/ mod LVD- diffuse HK w/ EF= 35%, DD, mod incr AoV thickness w/o AS...  ~  3/11:  CXR showed stable CABG, AICD, & chr changes... ~  10/11:  Myoview showed extensive scar, no ischemia, EF= 34% ~  10/11:  New AICD placed by DrKlein... ~  11/11:  2DEcho showed mod reduced LV sys function w/ EF= 30-35%, diffuse HK, gr1 DD> see report... ~  10/12:  BP=  126/68 & stable from the CV perspective... ~  4/13:  He's had f/u w/ Cards> DrKatz et al; notes reviewed, no med changes other than Coumadin regulation... ~  7/13:  His AICD was interrogated by DrKlein & no problems identified... ~  8/13:  2DEcho showed mild LVH, mod decr LVF w/ EF=35-40% w/ HK of the entire myocardium, Gr1DD, mild AoV sclerosis, mild MR, normal RV... ~  8/13:  VenDopplers of LEs ordered by Cards- neg for DVT ~  10/13:  He had f/u DrKatz who noted edema is diminished on the Lasix60... ~  10/13: on Coreg6.25Bid, Lisin10, Lasix40-1.5/d, Aldactone25; BP= 116/72 & he denies any CP, palpit, ch in SOB/ edema/ etc... ~  3/14:  BP controlled on meds> Coreg6.25Bid, Lisin10, Lasix40-1.5/d, Aldactone25; BP= 110/70 & he denies CP, palpit, SOB, etc; but he has developed gynecomastia from the aldactone & we will switch to Eplerenone25... ~  6/14:  BP=132/78 on his Coreg6.25Bid, Lisinopril10, Lasix40-2Qam, & Eplerenone25 (all breast tenderness has resolved);  He saw DrKatBenay Spice- CAD, cardiomyopathy, some incr edema & Lasix incr to 80Qam...  HYPERLIPIDEMIA - he takes SIMVASTATIN 20mg/71m he's had min, non-progressive incr LFTs noted. ~  FLP 4/[place] T[place]ol 138, TG 138, HDL 38, LDL 72... tolerating med well. ~  FLP 4/[place] TChol 118, TG 111, HDL 33, LDL 63 ~  FLP 12/09 showed TChol 118, TG 100, HDL 31, LDL 67 ~  FLP 10/10 on Simva20 showed TChol 111, TG 139, HDL 36, LDL 47 ~  FLP 10/11 on Simva20 showed TChol 101, TG 102, HDL 35, LDL 45 ~  FLP 10/12 on Simva20 showed TChol 124, TG 109, HDL 49, LDL 54 ~  FLP 10/13 & 3/14> pt wasn't fasting  for blood work today...  GERD (ICD-530.81) - no active symptoms and not currently taking PPI etc...  COLONIC POLYPS (ICD-211.3) - last colonoscopy here was 7/04 by Demetra Shiner w/ divertics and several polyps from 6 - 87m size, reportedly adenomatous on bx but I cannot find the path reports... he is overdue for f/u colon... ~  labs 2/10 showed Hg= 15.7..Marland KitchenMarland Kitchen we will send copy of note to DrChowdhury w/ request for f/u colonoscopy. ~  f/u colonoscopy 11/10 by DrChowdhury showed norm TI & several polyps removed- path +tubular adenoma...  CROHN'S DISEASE (ICD-555.9) - [place]Chowdhury [place] 6-MP 545mtabs- 1.5 tabs/d... he notes intermittent diarrhea and was started on Questran Prn... ~  labs 3/11 noted sl incr LFTs, Protime, & Sed... ~  labs 10/11 showed norm chems & LFTs x incr bili, Plat=88K, BNP=219, otherw norm. ~  Labs 10/12 showed norm chems, SGOT=59, TBili=3.9, Plat=85K... ~  [facility] off 6MP due to thrombocytopenia & doing satis, considering a biologic if needed, considering repeat colonoscopy... ~  [facility]> doing satis on Cholestyramine4gm daily, no changes made...  Hx of ACUTE VASCULAR INSUFFICIENCY OF INTESTINE (ICD-557.0) - he has embolectomy of the superior mesenteric artery 1/03 by DrSheryn Bisonlong hospitalization w/ complications)... he has remained on COUMADIN in the coumadin clinic ever since then...  VENTRAL HERNIA (ICD-553.20) - he is s/p left inguinal hernia repair 6/08 by DrClarita Leberand had a prev incarcerated ventral hernia repaired 11/03... his residual/ recurrent ventral hernia has been evaluated by DrClarita Leberho feels that [place]... currently he wears an abd binder when up and about...  SBO >> Hosp 3/14 - 04/19/11 by [facility] w/ SBO that opened up w/o surgical intervention... ~  CT Abd 3/13 showed early SBO pattern from an anterior wall hernia defect; ?GE varicies noted, s/p GB, hepatic steatosis, calcif granulomas in liver, cyst in right kidney, atherosclerotic ulcer in infrarenal Ao, ectasia of iliacs...  HYPERTROPHY PROSTATE W/UR OBST & OTH LUTS (ICD-600.01) - saw DrNesi 7/10 w/ BPH, obstructive symptoms, and ED- given  Flomax & "shots"...  ~  Labs 10/12 showed PSA= 2.93  DEGENERATIVE JOINT DISEASE (ICD-715.90) >>  ~  6/14:  He  presented w/ swelling in the feet, but L>R w/ pain & tender laterally; then he admitted to dropping a monkey wrench on his foot 1 week ago; XRay showed subluxed/ dislocated 5th MTP=> to Ortho ASAP...  Hx of POLYMYALGIA RHEUMATICA (ICD-725) - hasn't req steroids x yrs... ~  labs 2/10 showed Sed= 8 ~  labs 10/10 showed Sed= 6 ~  labs 3/11 showed Sed= 51 w/ acute illness. ~  labs 10/11 showed Sed= 9 ~  Labs 10/12 showed Sed= 9  TREMOR (ICD-781.0) - [facility]- essential tremor w/ +FamHx in sister... trial Clonazepam 0.2560mid w/ improvement...  ANXIETY (ICD-300.00) - he uses the same Clonazepam 0.33m17mn...  Hx of SHINGLES (ICD-053.9)  Thrombocytopenia >> serial labs w/ Plat counts betw 223 ==> 85; no bleeding diathesis, & he had acute intestinal vasc insuffic 2003 requiring embolectomy & has been on Coumadin Rx ever since then... ~    VITAMIN B12 DEFICIENCY (ICD-266.2) - on B 12 1000mc55mOTC... ~  labs 2/10 showed B12 level = 215... rec start oral B12 supplement daily. ~  labs 10/10 showed B12 level = 416... continue oral B12 supplement. ~  labs 10/11 showed B12= 592... continue same. ~  Labs 10/12 showed B12= 573  VITAMIN D DEFICIENCY (ICD-268.9) - on Vit D 1000 u daily OTC... ~  labs 2/10 showed Vit D level = 23... rec> start Vit D 1000 u daily.. ~  Labs 10/12 showed Vit D level = 50   Past Surgical History  Procedure Laterality Date  . Coronary artery bypass graft  1994  . Laparoscopic cholecystectomy  2002  . Elap w/ superior mesenteric artery embolectomy  1/03    by Dr. Jennette Banker  . Repair of incarcerated ventral hernia and lysis of adhesions  11/03    by Dr. Betsy Pries  . Implantation of biventric cardioverter-defibrillatorr      by Dr. Lovena Le in 8/06 [employer] 2011  . Implantation of guidant cognis device      model [id number]  . Left inguinal hernia repair with mesh  6/08    by Dr. Betsy Pries    Outpatient Encounter  Prescriptions as of 07/17/2012  Medication Sig Dispense Refill  . aspirin 81 MG tablet Take 81 mg by mouth daily.        . carvedilol (COREG) 6.25 MG tablet Take 1 tablet (6.25 mg total) by mouth 2 (two) times daily with a meal.  180 tablet  2  . cholecalciferol (VITAMIN D) 1000 UNITS tablet Take 1,000-2,000 Units by mouth daily.       . Cyanocobalamin (B-12) 1000 MCG CAPS Take 1 capsule by mouth daily.        [name] eplerenone (INSPRA) 25 MG tablet Take 1 tablet (25 mg total) by mouth daily.  90 tablet  3  . folic acid (FOLVITE) 1 MG tablet Take 1 mg by mouth daily.        . furosemide (LASIX) 40 MG tablet Take 80 mg by mouth daily.      [name] lisinopril (PRINIVIL,ZESTRIL) 10 MG tablet Take 1 tablet (10 mg total) by mouth daily.  90 tablet  2  . simvastatin (ZOCOR) 20 MG tablet Take 1 tablet (20 mg total) by mouth at bedtime.  90 tablet  2  . warfarin (COUMADIN) 5 MG tablet Take 1 tablet (5 mg total) by mouth as directed.  150 tablet  1  . [DISCONTINUED] furosemide (LASIX) 40 MG tablet Take 1.5 tablets (60 mg total) by mouth daily.  135 tablet  2   No facility-administered encounter medications on file as of 07/17/2012.    No Known Allergies   Current Medications, Allergies, Past Medical History, Past Surgical History, Family History, and Social History were reviewed in [employer] record.    Review of Systems         See HPI - all other systems neg except as noted... The patient complains of decreased hearing and dyspnea on exertion.  The patient denies anorexia, fever, weight loss, weight gain, vision loss, hoarseness, chest pain, syncope, peripheral edema, prolonged cough, headaches, hemoptysis, abdominal pain, melena, hematochezia, severe indigestion/heartburn, hematuria, incontinence, muscle weakness, suspicious skin lesions, transient blindness, difficulty walking, depression, unusual weight change, abnormal bleeding, enlarged lymph nodes, and angioedema.      Objective:   Physical Exam     WD, WN, 79 y/o WM in NAD... GENERAL:  Alert & oriented; pleasant & cooperative... HEENT:  [place]/AT, EOM-wnl, PERRLA, EACs-clear, TMs-wnl, NOSE- sl red/ inflamm, THROAT-clear & wnl. NECK:  Supple w/ fairROM; no JVD; normal carotid impulses w/o bruits; no thyromegaly or nodules palpated; no lymphadenopathy. CHEST:  Decr BS w/ few scat rhonchi, no wheezing, no rales, no signs of consolidation. HEART:  Regular Rhythm; gr 1/6 SEM without rubs or gallops detected... ABDOMEN:  Soft & nontender; mult scars, & ventral hernia; normal bowel sounds; no organomegaly or masses palpated. EXT: without deformities, mod arthritic changes; no varicose veins/ venous insuffic/ or edema. NEURO:  CN's intact; I do not apprec a tremor at present; no focal neuro deficits... DERM:  No lesions noted; no rash etc...  RADIOLOGY DATA:  Reviewed in the EPIC EMR & discussed w/ the patient...  LABORATORY DATA:  Reviewed in the EPIC EMR & discussed w/ the patient...   Assessment & Plan:    Left FOOT PAIN => dislocated left 5th MTP joint; refer to Ortho ASAP...   HBP>  BP controlled, continue same meds, except change from Aldactone to Eplerenone, and try to incr activity...  CAD/ Cardiomyopathy>  Followed by [name] & DrKlein, their notes are reviewed...  Hyperlipid>  On Simva20 + diet> continue same...  GI>  [place] now by [facility] as DrChowdhury has moved to [facility]; continue meds & close f/u w/ GI; now off 6MP rx...  Hx vasc insuffic intestines>  SMA embolectomy 2003, he remains on Coumadin via the CC...  Hx SBO requiring Hosp 3/13 but resolved w/ conserv approach per DrHIngram, C[facility]...  Other medical problems as noted...   Patient's Medications  New Prescriptions   No medications on file  Previous Medications   ASPIRIN 81 MG TABLET    Take 81 mg by mouth daily.     CARVEDILOL (COREG) 6.25 MG TABLET    Take 1 tablet (6.25 mg total) by mouth 2 (two) times daily with  a meal.   CHOLECALCIFEROL (VITAMIN D) 1000 UNITS TABLET    Take 1,000-2,000 Units by mouth daily.    CYANOCOBALAMIN (B-12) 1000 MCG CAPS    Take 1 capsule by mouth daily.     EPLERENONE (INSPRA) 25 MG TABLET    Take 1 tablet (25 mg total) by mouth daily.   FOLIC ACID (FOLVITE) 1 MG TABLET    Take 1 mg by mouth daily.     LISINOPRIL (PRINIVIL,ZESTRIL) 10 MG TABLET    Take 1 tablet (10 mg total) by mouth daily.   SIMVASTATIN (ZOCOR) 20 MG TABLET    Take 1 tablet (20 mg total) by mouth at bedtime.   WARFARIN (COUMADIN) 5 MG TABLET    Take 1 tablet (5 mg total) by mouth as directed.  Modified Medications   Modified Medication Previous Medication   FUROSEMIDE (LASIX) 40 MG TABLET furosemide (LASIX) 40 MG tablet      Take 80 mg by mouth daily.    Take 1.5 tablets (60 mg total) by mouth daily.  Discontinued Medications   No medications on file

## 2012-07-17 NOTE — Patient Instructions (Addendum)
Today we updated your med list in our EPIC system...    Continue your current medications the same...  Today we XRayed your left foot & we will call w/ the results & further recommendations...  For the Venous insufficiency & swelling>.    NO SALT    Elevate your legs    Wear support hose    Take the diuretics (Lasix & Eplerenone)  Call for any questions...  Let's plan a follow up visit in 73mo sooner if needed for problems..Marland KitchenMarland Kitchen

## 2012-07-18 DIAGNOSIS — M25579 Pain in unspecified ankle and joints of unspecified foot: Secondary | ICD-10-CM | POA: Diagnosis not present

## 2012-07-18 DIAGNOSIS — R609 Edema, unspecified: Secondary | ICD-10-CM | POA: Diagnosis not present

## 2012-07-25 DIAGNOSIS — R609 Edema, unspecified: Secondary | ICD-10-CM | POA: Diagnosis not present

## 2012-07-31 ENCOUNTER — Ambulatory Visit (INDEPENDENT_AMBULATORY_CARE_PROVIDER_SITE_OTHER): Payer: Medicare Other | Admitting: *Deleted

## 2012-07-31 DIAGNOSIS — K55059 Acute (reversible) ischemia of intestine, part and extent unspecified: Secondary | ICD-10-CM | POA: Diagnosis not present

## 2012-07-31 DIAGNOSIS — K55069 Acute infarction of intestine, part and extent unspecified: Secondary | ICD-10-CM

## 2012-07-31 LAB — POCT INR: INR: 2.2

## 2012-08-01 ENCOUNTER — Other Ambulatory Visit: Payer: Self-pay | Admitting: Cardiology

## 2012-08-03 ENCOUNTER — Other Ambulatory Visit: Payer: Self-pay | Admitting: Cardiology

## 2012-08-13 DIAGNOSIS — D696 Thrombocytopenia, unspecified: Secondary | ICD-10-CM | POA: Diagnosis not present

## 2012-08-13 DIAGNOSIS — E559 Vitamin D deficiency, unspecified: Secondary | ICD-10-CM | POA: Diagnosis not present

## 2012-08-14 ENCOUNTER — Ambulatory Visit (INDEPENDENT_AMBULATORY_CARE_PROVIDER_SITE_OTHER): Payer: Medicare Other | Admitting: Internal Medicine

## 2012-08-14 ENCOUNTER — Encounter: Payer: Self-pay | Admitting: Internal Medicine

## 2012-08-14 VITALS — BP 122/66 | HR 60 | Ht 72.0 in | Wt 205.0 lb

## 2012-08-14 DIAGNOSIS — I2589 Other forms of chronic ischemic heart disease: Secondary | ICD-10-CM

## 2012-08-14 DIAGNOSIS — I5022 Chronic systolic (congestive) heart failure: Secondary | ICD-10-CM | POA: Diagnosis not present

## 2012-08-14 DIAGNOSIS — Z95810 Presence of automatic (implantable) cardiac defibrillator: Secondary | ICD-10-CM

## 2012-08-14 DIAGNOSIS — I4589 Other specified conduction disorders: Secondary | ICD-10-CM

## 2012-08-14 LAB — ICD DEVICE OBSERVATION
AL AMPLITUDE: 4.4 mv
ATRIAL PACING ICD: 86 pct
LV LEAD AMPLITUDE: 25 mv
RV LEAD AMPLITUDE: 23.4 mv
TZAT-0013FASTVT: 2
TZAT-0018FASTVT: NEGATIVE
TZST-0001FASTVT: 3
TZST-0001FASTVT: 7
TZST-0001FASTVT: 8
TZST-0003FASTVT: 41 J
TZST-0003FASTVT: 41 J
VENTRICULAR PACING ICD: 99 pct

## 2012-08-14 NOTE — Progress Notes (Signed)
Patient Care Team: Noralee Space, MD as PCP - General   HPI  [name] is a 79 y.o. male  Seen for  ICD implanted for primary prevention in the setting of ischemic coronary disease with prior CABG as part of the MADIT-CRT trial.   His last catheterization in 2006 demonstrated a total right and 80% marginal. Vein graft to the diagonal was occluded and other bypasses were open; ejection fraction 2007 was about 35%  Nuclear scan Oct 2011>>scar no ischemia   His exercise capacity is quite good. His major problem has been edema; he is quite good in his salt restriction   Venous Doppler study 8/13 failed to demonstrate venous insufficiency   Past Medical History  Diagnosis Date  . Dizziness     Positional dizziness  . Hypertension   . Ischemic cardiomyopathy     CABG 1994 CAD catherization 1996.[name] occluded vein graft to the diagnol, other grafts patent/  catherization 2006, no PCI/ nuclear.[name] october 2011.[name] extensive scar anteroseptal and apical.. no ischemia    . CHF (congestive heart failure)     chronic systolic  . Mitral regurgitation     mild.[name] echo november 2011  EF 35%...echo.. november 2009/ ef 35%.[name] echo november 2011  . Sinus bradycardia   . Chronotropic incompetence   . Biventricular implantable cardiac defibrillator in situ     GDT CONTAK H170-MADIT-CRT-EXPLANTED 2011 implanted defibrillator- [employer] cognis, model n119-2001 Dr. Caryl Comes (11/28/2009)  . LBBB (left bundle branch block)   . Hyperlipemia   . GERD (gastroesophageal reflux disease)   . Colonic polyp   . Crohn's disease   . Acute vascular insufficiency of intestine     Mesenteric embolus...surgery 2003  . Ventral hernia   . Hypertrophy of prostate with urinary obstruction and other lower urinary tract symptoms (LUTS)   . Degenerative joint disease   . Polymyalgia rheumatica   . Tremor   . Anxiety   . Shingles   . Vitamin B12 deficiency   . CAD (coronary artery disease)     CABG 1994  /   catheterization  1996, occluded vein graft to the diagonal, other grafts patent  /   catheterization 2006, no PCI  /  nuclear, October, 2011, extensive scar anteroseptal and apical, no ischemia  . Hx of CABG     1994  . Ejection fraction     EF 35%, echo, November, 2011  //   Is 35-40%, echo,  September 23, 2011  . Warfarin anticoagulation     Coumadin therapy for mesenteric embolus 2003  . Carotid artery disease     Doppler, February,  2012, 0-39% bilateral, recommended followup 1 year  . Edema leg     Venous Dopplers 8/13: No DVT bilaterally  . Breast tenderness     Spironolactone was stopped and eplerinone started. Patient feels better    Past Surgical History  Procedure Laterality Date  . Coronary artery bypass graft  1994  . Laparoscopic cholecystectomy  2002  . Elap w/ superior mesenteric artery embolectomy  1/03    by Dr. Jennette Banker  . Repair of incarcerated ventral hernia and lysis of adhesions  11/03    by Dr. Betsy Pries  . Implantation of biventric cardioverter-defibrillatorr      by Dr. Lovena Le in 8/06 [employer] 2011  . Implantation of guidant cognis device      model [id number]  . Left inguinal hernia repair with mesh  6/08    by Dr. Betsy Pries  Current Outpatient Prescriptions  Medication Sig Dispense Refill  . aspirin 81 MG tablet Take 81 mg by mouth daily.        . carvedilol (COREG) 6.25 MG tablet Take 1 tablet (6.25 mg total) by mouth 2 (two) times daily with a meal.  180 tablet  2  . cholecalciferol (VITAMIN D) 1000 UNITS tablet Take 1,000-2,000 Units by mouth daily.       . Cyanocobalamin (B-12) 1000 MCG CAPS Take 1 capsule by mouth daily.        [name] eplerenone (INSPRA) 25 MG tablet Take 1 tablet (25 mg total) by mouth daily.  90 tablet  3  . folic acid (FOLVITE) 1 MG tablet Take 1 mg by mouth daily.        . furosemide (LASIX) 40 MG tablet Take 80 mg by mouth daily.      [name] lisinopril (PRINIVIL,ZESTRIL) 10 MG tablet Take 1 tablet (10 mg total) by mouth daily.  90 tablet   2  . simvastatin (ZOCOR) 20 MG tablet Take 1 tablet (20 mg total) by mouth at bedtime.  90 tablet  2  . warfarin (COUMADIN) 5 MG tablet Take 1 tablet by mouth as  directed  150 tablet  1   No current facility-administered medications for this visit.    No Known Allergies  Review of Systems negative except from HPI and PMH  Physical Exam BP 122/66  Pulse 60  Ht 6' (1.829 m)  Wt 205 lb (92.987 kg)  BMI 27.8 kg/m2 Well developed and well nourished in no acute distress HENT normal E scleral and icterus clear Neck Supple JVP flat; carotids brisk and full Clear to ausculation Device pocket well healed; without hematoma or erythema.  There is no tethering *Regular rate and rhythm, no murmurs gallops or rub Soft with active bowel sounds No clubbing cyanosis 3+ Edema Alert and oriented, grossly normal motor and sensory function Skin Warm and Dry  ECG demonstrates AV a a a biventricular pacing  Assessment and  Plan

## 2012-08-14 NOTE — Assessment & Plan Note (Signed)
The patient's device was interrogated.  The information was reviewed. No changes were made in the programming.    

## 2012-08-14 NOTE — Patient Instructions (Addendum)
Your physician has recommended you make the following change in your medication:  1) Increase lasix (furosemide) to 80 mg one tablet by mouth twice daily every other day alternating with 80 mg once tablet every other day for 2 weeks, then resume one tablet daily.  Call in 2 weeks and let Dr. [name] know how your swelling is doing.  Remote monitoring is used to monitor your Pacemaker of ICD from home. This monitoring reduces the number of office visits required to check your device to one time per year. It allows [place] to keep an eye on the functioning of your device to ensure it is working properly. You are scheduled for a device check from home on 11/15/12. You may send your transmission at any time that day. If you have a wireless device, the transmission will be sent automatically. After your physician reviews your transmission, you will receive a postcard with your next transmission date.  Your physician wants you to follow-up in: 1 year with Dr. Caryl Comes. You will receive a reminder letter in the mail two months in advance. If you don't receive a letter, please call our office to schedule the follow-up appointment.

## 2012-08-14 NOTE — Assessment & Plan Note (Signed)
He's got significant edema without elevation in JVP. We will try And utilize more Lasix and see if we can make A. Dent in his edema; we'll increase it to 80 daily alternating with twice daily. He is to keep a record of his weights if we can make an impact on his way or edema if that is the case Perhaps we should increase his chronic diuretic regime. I will be town at that point I'll asked to contact Dr. [name]. Assessment of renal function and potassium would need to be done in the event that his chronic regime his altered  These numbers were normal April 14

## 2012-08-14 NOTE — Assessment & Plan Note (Signed)
Stable

## 2012-08-21 ENCOUNTER — Ambulatory Visit: Payer: Medicare Other | Admitting: Pulmonary Disease

## 2012-08-22 ENCOUNTER — Other Ambulatory Visit: Payer: Self-pay | Admitting: Dermatology

## 2012-08-22 DIAGNOSIS — C44319 Basal cell carcinoma of skin of other parts of face: Secondary | ICD-10-CM | POA: Diagnosis not present

## 2012-08-22 DIAGNOSIS — C44621 Squamous cell carcinoma of skin of unspecified upper limb, including shoulder: Secondary | ICD-10-CM | POA: Diagnosis not present

## 2012-08-22 DIAGNOSIS — D235 Other benign neoplasm of skin of trunk: Secondary | ICD-10-CM | POA: Diagnosis not present

## 2012-08-22 DIAGNOSIS — C4370 Malignant melanoma of unspecified lower limb, including hip: Secondary | ICD-10-CM | POA: Diagnosis not present

## 2012-08-31 HISTORY — PX: PROSTATE SURGERY: SHX751

## 2012-09-11 ENCOUNTER — Telehealth: Payer: Self-pay | Admitting: Pulmonary Disease

## 2012-09-11 ENCOUNTER — Ambulatory Visit (INDEPENDENT_AMBULATORY_CARE_PROVIDER_SITE_OTHER): Payer: Medicare Other | Admitting: *Deleted

## 2012-09-11 DIAGNOSIS — K55059 Acute (reversible) ischemia of intestine, part and extent unspecified: Secondary | ICD-10-CM

## 2012-09-11 DIAGNOSIS — K55069 Acute infarction of intestine, part and extent unspecified: Secondary | ICD-10-CM

## 2012-09-11 NOTE — Telephone Encounter (Signed)
Per SN. This could be kidney stones. No HX of pt having this in the past. He needs to see his urologists or go to the ED for prompt eval.  I called and spoke with spouse. She voiced her understanding and will see what she can get pt to do. Nothing further needed

## 2012-09-11 NOTE — Telephone Encounter (Signed)
I spoke with spouse. She stated pt has blood in his urine. She stated this started Saturday afternoon. Then on Sunday his lower back was hurting and the pain would go down into his "groin area". He has to sit still so he does not hurt. She is giving pt advil to help with pain. Please advise SN thanks 07/17/12 SN Pending 11/05/12 SN No Known Allergies

## 2012-09-13 DIAGNOSIS — N400 Enlarged prostate without lower urinary tract symptoms: Secondary | ICD-10-CM | POA: Diagnosis not present

## 2012-09-13 DIAGNOSIS — R310 Gross hematuria: Secondary | ICD-10-CM | POA: Diagnosis not present

## 2012-09-13 DIAGNOSIS — R351 Nocturia: Secondary | ICD-10-CM | POA: Diagnosis not present

## 2012-09-13 DIAGNOSIS — N390 Urinary tract infection, site not specified: Secondary | ICD-10-CM | POA: Diagnosis not present

## 2012-09-13 DIAGNOSIS — R319 Hematuria, unspecified: Secondary | ICD-10-CM | POA: Diagnosis not present

## 2012-09-17 DIAGNOSIS — N138 Other obstructive and reflux uropathy: Secondary | ICD-10-CM | POA: Diagnosis not present

## 2012-09-17 DIAGNOSIS — R972 Elevated prostate specific antigen [PSA]: Secondary | ICD-10-CM | POA: Diagnosis not present

## 2012-09-17 DIAGNOSIS — I10 Essential (primary) hypertension: Secondary | ICD-10-CM | POA: Diagnosis not present

## 2012-09-17 DIAGNOSIS — N420 Calculus of prostate: Secondary | ICD-10-CM | POA: Diagnosis not present

## 2012-09-17 DIAGNOSIS — N401 Enlarged prostate with lower urinary tract symptoms: Secondary | ICD-10-CM | POA: Diagnosis not present

## 2012-09-17 DIAGNOSIS — D6959 Other secondary thrombocytopenia: Secondary | ICD-10-CM | POA: Diagnosis not present

## 2012-09-17 DIAGNOSIS — I2589 Other forms of chronic ischemic heart disease: Secondary | ICD-10-CM | POA: Diagnosis not present

## 2012-09-17 DIAGNOSIS — Z95810 Presence of automatic (implantable) cardiac defibrillator: Secondary | ICD-10-CM | POA: Diagnosis not present

## 2012-09-17 DIAGNOSIS — R310 Gross hematuria: Secondary | ICD-10-CM | POA: Diagnosis not present

## 2012-09-17 DIAGNOSIS — N320 Bladder-neck obstruction: Secondary | ICD-10-CM | POA: Diagnosis not present

## 2012-09-20 DIAGNOSIS — N320 Bladder-neck obstruction: Secondary | ICD-10-CM | POA: Diagnosis not present

## 2012-09-20 DIAGNOSIS — Z950 Presence of cardiac pacemaker: Secondary | ICD-10-CM | POA: Diagnosis not present

## 2012-09-20 DIAGNOSIS — N400 Enlarged prostate without lower urinary tract symptoms: Secondary | ICD-10-CM | POA: Diagnosis not present

## 2012-09-20 DIAGNOSIS — Z95810 Presence of automatic (implantable) cardiac defibrillator: Secondary | ICD-10-CM | POA: Diagnosis not present

## 2012-09-20 DIAGNOSIS — N411 Chronic prostatitis: Secondary | ICD-10-CM | POA: Diagnosis not present

## 2012-09-20 DIAGNOSIS — N138 Other obstructive and reflux uropathy: Secondary | ICD-10-CM | POA: Diagnosis not present

## 2012-09-20 DIAGNOSIS — N401 Enlarged prostate with lower urinary tract symptoms: Secondary | ICD-10-CM | POA: Diagnosis not present

## 2012-09-20 DIAGNOSIS — N139 Obstructive and reflux uropathy, unspecified: Secondary | ICD-10-CM | POA: Diagnosis not present

## 2012-09-20 DIAGNOSIS — I2510 Atherosclerotic heart disease of native coronary artery without angina pectoris: Secondary | ICD-10-CM | POA: Diagnosis not present

## 2012-09-20 DIAGNOSIS — N420 Calculus of prostate: Secondary | ICD-10-CM | POA: Diagnosis not present

## 2012-09-20 DIAGNOSIS — I10 Essential (primary) hypertension: Secondary | ICD-10-CM | POA: Diagnosis not present

## 2012-09-20 DIAGNOSIS — R310 Gross hematuria: Secondary | ICD-10-CM | POA: Diagnosis not present

## 2012-09-21 DIAGNOSIS — Z95810 Presence of automatic (implantable) cardiac defibrillator: Secondary | ICD-10-CM | POA: Diagnosis not present

## 2012-09-21 DIAGNOSIS — I10 Essential (primary) hypertension: Secondary | ICD-10-CM | POA: Diagnosis not present

## 2012-09-21 DIAGNOSIS — N401 Enlarged prostate with lower urinary tract symptoms: Secondary | ICD-10-CM | POA: Diagnosis not present

## 2012-09-21 DIAGNOSIS — N138 Other obstructive and reflux uropathy: Secondary | ICD-10-CM | POA: Diagnosis not present

## 2012-09-21 DIAGNOSIS — N420 Calculus of prostate: Secondary | ICD-10-CM | POA: Diagnosis not present

## 2012-09-21 DIAGNOSIS — R310 Gross hematuria: Secondary | ICD-10-CM | POA: Diagnosis not present

## 2012-09-21 DIAGNOSIS — N320 Bladder-neck obstruction: Secondary | ICD-10-CM | POA: Diagnosis not present

## 2012-09-24 DIAGNOSIS — N400 Enlarged prostate without lower urinary tract symptoms: Secondary | ICD-10-CM | POA: Diagnosis not present

## 2012-09-24 DIAGNOSIS — N390 Urinary tract infection, site not specified: Secondary | ICD-10-CM | POA: Diagnosis not present

## 2012-09-24 DIAGNOSIS — N318 Other neuromuscular dysfunction of bladder: Secondary | ICD-10-CM | POA: Diagnosis not present

## 2012-09-24 DIAGNOSIS — R310 Gross hematuria: Secondary | ICD-10-CM | POA: Diagnosis not present

## 2012-10-02 ENCOUNTER — Ambulatory Visit (INDEPENDENT_AMBULATORY_CARE_PROVIDER_SITE_OTHER): Payer: Medicare Other | Admitting: *Deleted

## 2012-10-02 DIAGNOSIS — K55059 Acute (reversible) ischemia of intestine, part and extent unspecified: Secondary | ICD-10-CM | POA: Diagnosis not present

## 2012-10-02 DIAGNOSIS — K55069 Acute infarction of intestine, part and extent unspecified: Secondary | ICD-10-CM

## 2012-10-02 DIAGNOSIS — R310 Gross hematuria: Secondary | ICD-10-CM | POA: Diagnosis not present

## 2012-10-02 DIAGNOSIS — N400 Enlarged prostate without lower urinary tract symptoms: Secondary | ICD-10-CM | POA: Diagnosis not present

## 2012-10-02 DIAGNOSIS — N318 Other neuromuscular dysfunction of bladder: Secondary | ICD-10-CM | POA: Diagnosis not present

## 2012-10-02 DIAGNOSIS — N390 Urinary tract infection, site not specified: Secondary | ICD-10-CM | POA: Diagnosis not present

## 2012-10-02 LAB — POCT INR: INR: 1.1

## 2012-10-09 ENCOUNTER — Ambulatory Visit (INDEPENDENT_AMBULATORY_CARE_PROVIDER_SITE_OTHER): Payer: Medicare Other | Admitting: *Deleted

## 2012-10-09 DIAGNOSIS — N400 Enlarged prostate without lower urinary tract symptoms: Secondary | ICD-10-CM | POA: Diagnosis not present

## 2012-10-09 DIAGNOSIS — R310 Gross hematuria: Secondary | ICD-10-CM | POA: Diagnosis not present

## 2012-10-09 DIAGNOSIS — K55059 Acute (reversible) ischemia of intestine, part and extent unspecified: Secondary | ICD-10-CM | POA: Diagnosis not present

## 2012-10-09 DIAGNOSIS — N390 Urinary tract infection, site not specified: Secondary | ICD-10-CM | POA: Diagnosis not present

## 2012-10-09 DIAGNOSIS — K55069 Acute infarction of intestine, part and extent unspecified: Secondary | ICD-10-CM

## 2012-10-09 DIAGNOSIS — N318 Other neuromuscular dysfunction of bladder: Secondary | ICD-10-CM | POA: Diagnosis not present

## 2012-10-09 LAB — POCT INR: INR: 1.9

## 2012-10-13 ENCOUNTER — Other Ambulatory Visit: Payer: Self-pay | Admitting: Cardiology

## 2012-10-17 ENCOUNTER — Telehealth: Payer: Self-pay | Admitting: Cardiology

## 2012-10-17 NOTE — Telephone Encounter (Deleted)
Error

## 2012-10-23 ENCOUNTER — Ambulatory Visit (INDEPENDENT_AMBULATORY_CARE_PROVIDER_SITE_OTHER): Payer: Medicare Other | Admitting: Pharmacist

## 2012-10-23 DIAGNOSIS — K55069 Acute infarction of intestine, part and extent unspecified: Secondary | ICD-10-CM

## 2012-10-23 DIAGNOSIS — K55059 Acute (reversible) ischemia of intestine, part and extent unspecified: Secondary | ICD-10-CM

## 2012-11-05 ENCOUNTER — Ambulatory Visit (INDEPENDENT_AMBULATORY_CARE_PROVIDER_SITE_OTHER): Payer: Medicare Other | Admitting: Pulmonary Disease

## 2012-11-05 ENCOUNTER — Encounter: Payer: Self-pay | Admitting: Pulmonary Disease

## 2012-11-05 VITALS — BP 140/86 | HR 68 | Temp 97.7°F | Ht 72.0 in | Wt 206.0 lb

## 2012-11-05 DIAGNOSIS — D126 Benign neoplasm of colon, unspecified: Secondary | ICD-10-CM

## 2012-11-05 DIAGNOSIS — I10 Essential (primary) hypertension: Secondary | ICD-10-CM

## 2012-11-05 DIAGNOSIS — I2589 Other forms of chronic ischemic heart disease: Secondary | ICD-10-CM

## 2012-11-05 DIAGNOSIS — K5090 Crohn's disease, unspecified, without complications: Secondary | ICD-10-CM

## 2012-11-05 DIAGNOSIS — M1990 Unspecified osteoarthritis, unspecified site: Secondary | ICD-10-CM

## 2012-11-05 DIAGNOSIS — I2510 Atherosclerotic heart disease of native coronary artery without angina pectoris: Secondary | ICD-10-CM | POA: Diagnosis not present

## 2012-11-05 DIAGNOSIS — E785 Hyperlipidemia, unspecified: Secondary | ICD-10-CM

## 2012-11-05 DIAGNOSIS — G4733 Obstructive sleep apnea (adult) (pediatric): Secondary | ICD-10-CM | POA: Diagnosis not present

## 2012-11-05 DIAGNOSIS — Z95810 Presence of automatic (implantable) cardiac defibrillator: Secondary | ICD-10-CM

## 2012-11-05 DIAGNOSIS — R251 Tremor, unspecified: Secondary | ICD-10-CM

## 2012-11-05 DIAGNOSIS — K219 Gastro-esophageal reflux disease without esophagitis: Secondary | ICD-10-CM

## 2012-11-05 DIAGNOSIS — N401 Enlarged prostate with lower urinary tract symptoms: Secondary | ICD-10-CM

## 2012-11-05 DIAGNOSIS — N138 Other obstructive and reflux uropathy: Secondary | ICD-10-CM

## 2012-11-05 NOTE — Progress Notes (Signed)
Subjective:    Patient ID: [name], male    DOB: [birth year], 79 y.o.   MRN: [id number]  HPI 79 y/o WM here for a follow up visit... he has multiple medical problems as noted below...    ~  follow[place] DrChowdhury/ now [place]Holmes [place] w/ sm bowel Crohn's- s/p mult resections w/ complications, stable on 6-MP for prevention of recurrence... also has sl incr fasting bili c/w Gilbert's... ~  followed by [name] for Cards- w/ CAD, s/p CABG, Cardiomyopathy w/ AICD- stable... f/u 2DEcho 11/09 showed diffuseHK w/ EF= 35% & DD, mod incr AoV thickness w/o AS... EKG= pacer rhythm.  ~  November 30, 2011:  15moROV & JWille Glaseris c/o a URI/ cold/ sinus infection w/ congestion/ drainage/ beige sput/ etc; we discussed ZPak & Saline for treatment... We reviewed the following medical problems during today's office visit>>     OSA> eval by DrClance 8/13 & started on CPAP but having some interface problems & they are checking download for compliance etc...    HBP> on Coreg6.25Bid, Lisin10, Lasix40-1.5/d, Aldactone25; BP= 116/72 & he denies any CP, palpit, ch in SOB/ edema/ etc...    CAD, Cardiomyopathy> on ASA81 & Coumadin; s/p CABG 1994, [facility] & Lasix increased to 660md; last seen by DrKlein 7/13 & ICD functioning normally...    Chol> on Simva20; not fasting today & his last FLP 10/12 showed TChol 124, TG 109, HDL 49, LDL 54    GI- GERD, Polyps> stable & he currently denies abd pain, n/v, c/d, blood seen...    Crohn's dis, Hx SBO> he is followed [place] DrHolmes, [place] and [name] & they have stopped his 6MP; Crohn's has been stable since then 7 he indicated he may start a biologic med in the future...    Hx mesenteric vasc insuffic w/ embolectomy> on Coumadin & monitored in the CC, stable...    BPH> aware- prev checked by drNesi, PSAs have been wnl...    DJD, PMR> on B12, VitD, Folate; stable & not requiring pain meds or  Pred...    Tremor> eval by DrDalphine Handingn the past w/ trial of Clonazepam w/ some improvement...    Anxiety> not currently on Klonopin etc...    Low Plat[place] ct> [place]Holmes, [place]; Plats ranged 67-73K & they stopped his 6MP Rx for crohn's dis... We reviewed prob list, meds, xrays and labs> see below for updates >>   ~  April 18, 2012:  15m75moVFultss several complaints/ concerns as listed below>>      C/o an URI w/ head congestion, drainage, etc but denies f/c/s, cough/ sput/ SOB & we discussed OTC meds to use prn...    BP controlled on meds> Coreg6.25Bid, Lisin10, Lasix40-1.5/d, Aldactone25; BP= 110/70 & he denies CP, palpit, SOB, etc...    He is c/o bilat breast swelling, sore & tender; he is on Aldactone25 & this is the likely culprit; we will switch to Eplerenone25...    He remains on Simva20 for his Chol & last FLP was 10/12 w/ TChol 124, TG 109, HDL 49, LDL 54; he needs to ret fasting for f/u labs...    He saw DrHolmes GI [place] f/u Crohn's 12/13> stable on Cholestyramine 4gm/d...    Wife says he's grumpy; offered med rx- SSRI etc but he declines...Marland KitchenMarland Kitchen  He reports a Melanoma removed from left calf area recently w/ some post op swelling; path showed superfic spreading melanoma & it is being managed by DrLupton w/ Bx then wide excision We reviewed prob list, meds, xrays and labs> see below for updates >> he remains on VitD, B12, & Folate...  ~  July 17, 2012:  6moROV & add-on appt at wife's request due to feet swelling L>R w/ some discoloration & pain on left foot- hurts to walk on it x 1wk;  When pressed for explanation [name] mentioned that he did in fact drop a monkey wrench on his left foot ~1wk ago;  Exam shows ven insuff w/ some mild edema on right but left foot has several ecchymoses, 3+ edema, some redness (no broken skin etc) & tender over 5th metatarsal area (top, side, & bottom);  XRay shows marked sublux vs dislocation at 5th MTP  joint, no acute fx, +for osseous demineralization, etc...   REC> refer to Ortho ASAP...    Other medical issues appear stable>  BP=132/78 on his Coreg6.25Bid, Lisinopril10, Lasix40-2Qam, & Eplerenone25 (all breast tenderness has resolved);  He saw DBenay Spice4/14- CAD, cardiomyopathy, some incr edema & Lasix incr to 80Qam;  Stable on Coumadin & his AICD is functioning normally;  Chol looks good on his diet + Simva20;  He [place] & [facility] to [facility] (likely due to his prev 6MP rx...   ~  November 05, 2012:  3-458moOV & [name] saw Ortho after his last appt- DrHilts felts he had a chr dislocated 5th MTP, used compression wrap & his swelling improved over time;  In the interim he has developed prostate trouble- [facility] [place] [facility] surg but we don't have any records; apparently he had Hematuria work up w/ urine, XRays, cysto- told to have BPH and had ?TURP? (we have requested records to review), he notes that urine stream is better now... We reviewed the following medical problems during today's office visit >>     OSA> eval by DrClance 8/13 & started on CPAP but having some interface problems & he is not currently using the apparatus...    HBP> on Coreg6.25Bid, Lisin10, Lasix40-1.5/d, Inspra25; BP= 140/86 & he denies any CP, palpit, ch in SOB/ edema/ etc...    CAD, Cardiomyopathy> on ASA81 & Coumadin; s/p CABG 1994, AICD placed & last seen by DrKlein 7/14- ICD functioning normally; CAD, s/p CABG w/ one occluded graft, EF=35%, Myoview 10/11 w/ scar, no ischemia; Lasix adjusted to improve periph edema...     Chol> on Simva20; not fasting today & his last FLP 10/12 showed TChol 124, TG 109, HDL 49, LDL 54... Needs to ret for f/u FLP.    GI- GERD, Polyps> stable & he currently denies abd pain, n/v, c/d, blood seen...    Crohn's dis, Hx SBO> he is followed by DrHolmes, [place] and he [name] platelets & they have stopped his 6MP; Crohn's has been stable since then & he indicated he may start a biologic med in the future...    Hx mesenteric vasc insuffic w/ embolectomy> on Coumadin & monitored in the CC, stable...    BPH> aware- [facility]; [facility]...    DJD, PMR>  on B12, VitD, Folate; stable & not requiring pain meds or Pred...    Tremor> eval by Dalphine Handing in the past w/ trial of Clonazepam w/ some improvement...    Anxiety> not currently on Klonopin etc...    Low [place] ct> He saw a Hematologist in [place] on referral by DrHolmes, GI in [place]; Plats ranged 67-73K & they stopped his 6MP Rx for crohn's dis & he needs f/u labs... We reviewed prob list, meds, xrays and labs> see below for updates >>  HE NEEDS TO RET FOR f/u FASTING blood work...Marland KitchenMarland Kitchen          Problem List:  OSA >> He had a sleep study w/ AHI=34- loud snoring & abnormal breathing pattern noted by wife; pt notes freq awakenings & not feeling rested in the AM; but he denied issues w/ daytime sleepiness & his ESS= only1... Started on CPAP & they are following up... ~  He reports mask/ interface problems and he is not using the CPAP; asked to check w/ DME & f/u w/ DrClance...  HBP, CAD, Cardiomyopathy >> followed by [name] and DrKlein... he had CABG in 1994 (with cath in 96 revealing a total right and an 80% OM, vein graft to the diagonal was occluded, other bypasses were patent), Biv AICD in 2006...  On COUMADIN (follow in clinic), ASA 9m/d,  COREG 6.243mid,  LISINOPRIL 2058m,  LASIX 60m35m  SPIRONOLACTONE 25mg61m. ~  Cardiolite 3/03 w/ EF=37%...  ~  Cath 6/06 w/ native vessel and graft disease...  ~  2DEcho 11/09 stable w/ mod LVD- diffuse HK w/ EF= 35%, DD, mod incr AoV thickness w/o AS...  ~  3/11:  CXR showed stable CABG, AICD, & chr changes... ~  10/11:  Myoview showed extensive scar, no  ischemia, EF= 34% ~  10/11:  New AICD placed by DrKlein... ~  11/11:  2DEcho showed mod reduced LV sys function w/ EF= 30-35%, diffuse HK, gr1 DD> see report... ~  10/12:  BP= 126/68 & stable from the CV perspective... ~  4/13:  He's had f/u w/ Cards> DrKatz et al; notes reviewed, no med changes other than Coumadin regulation... ~  7/13:  His AICD was interrogated by DrKlein & no problems identified... ~  8/13:  2DEcho showed mild LVH, mod decr LVF w/ EF=35-40% w/ HK of the entire myocardium, Gr1DD, mild AoV sclerosis, mild MR, normal RV... ~  8/13:  VenDopplers of LEs ordered by Cards- neg for DVT ~  10/13:  He had f/u DrKatz who noted edema is diminished on the Lasix60... ~  10/13: on Coreg6.25Bid, Lisin10, Lasix40-1.5/d, Aldactone25; BP= 116/72 & he denies any CP, palpit, ch in SOB/ edema/ etc... ~  3/14:  BP controlled on meds> Coreg6.25Bid, Lisin10, Lasix40-1.5/d, Aldactone25; BP= 110/70 & he denies CP, palpit, SOB, etc; but he has developed gynecomastia from the aldactone & we will switch to Eplerenone25... ~  6/14:  BP=132/78 on his Coreg6.25Bid, Lisinopril10, Lasix40-2Qam, & Eplerenone25 (all breast tenderness has resolved);  He saw DrKatBenay Spice- CAD, cardiomyopathy, some incr edema & Lasix incr to 80Qam... ~  10/14: on Coreg6.25Bid, Lisin10, Lasix40-1.5/d, Inspra25; BP= 140/86 & he denies any CP, palpit, ch in SOB/ edema/ etc; Also on ASA81 & Coumadin; s/p CABG 1994, AICD placed & last seen by DrKlein 7/14- ICD functioning normally; CAD, s/p CABG w/ one occluded graft, EF=35%, Myoview 10/11 w/ scar, no ischemia; Lasix adjusted to improve periph edema  HYPERLIPIDEMIA - he takes SIMVASTATIN 20mg/66m he's had min, non-progressive incr LFTs noted. ~  FLP 4/08 showed TChol 138, TG 138, HDL 38, LDL 72... tolerating med well. ~  [place] 4/09 showed TChol 118, TG 111, HDL 33, LDL 63 ~  FLP 12/09 showed TChol 118, TG 100, HDL 31, LDL 67 ~  FLP 10/10 on Simva20 showed TChol 111, TG 139, HDL 36, LDL  47 ~  FLP 10/11 on Simva20 showed TChol 101, TG 102, HDL 35, LDL 45 ~  FLP 10/12 on Simva20 showed TChol 124, TG 109, HDL 49, LDL 54 ~  FLP 10/13 & 3/14> pt wasn't fasting for blood work today... ~  He needs to ret for f/u FLP...  GERD (ICD-530.81) - no active symptoms and not currently taking PPI etc...  COLONIC POLYPS (ICD-211.3) - last colonoscopy here was 7/04 by Demetra Shiner w/ divertics and several polyps from 6 - 40m size, reportedly adenomatous on bx but I cannot find the path reports... he is overdue for f/u colon... ~  labs 2/10 showed Hg= 15.7..Marland KitchenMarland Kitchenwe will send copy of note to DrChowdhury w/ request for f/u colonoscopy. ~  f/u colonoscopy 11/10 by DrChowdhury showed norm TI & several polyps removed- path +tubular adenoma...  CROHN'S DISEASE (ICD-555.9) - [place]Chowdhury [place] 6-MP 52mtabs- 1.5 tabs/d... he notes intermittent diarrhea and was started on Questran Prn... ~  labs 3/11 noted sl incr LFTs, Protime, & Sed... ~  labs 10/11 showed norm chems & LFTs x incr bili, Plat=88K, BNP=219, otherw norm. ~  Labs 10/12 showed norm chems, SGOT=59, TBili=3.9, Plat=85K... ~  [facility] off 6MP due to thrombocytopenia & doing satis, considering a biologic if needed, considering repeat colonoscopy... ~  [facility]> doing satis on Cholestyramine4gm daily, no changes made...  Hx of ACUTE VASCULAR INSUFFICIENCY OF INTESTINE (ICD-557.0) - he has embolectomy of the superior mesenteric artery 1/03 by DrSheryn Bisonlong hospitalization w/ complications)... he has remained on COUMADIN in the coumadin clinic ever since then...  VENTRAL HERNIA (ICD-553.20) - he is s/p left inguinal hernia repair 6/08 by DrClarita Leberand had a prev incarcerated ventral hernia repaired 11/03... his residual/ recurrent ventral hernia has been evaluated by DrClarita Leberho feels that [place] surg... currently he wears an abd binder when up and  about...  SBO >> Hosp 3/14 - 04/19/11 by [facility] w/ SBO that opened up w/o surgical intervention... ~  CT Abd 3/13 showed early SBO pattern from an anterior wall hernia defect; ?GE varicies noted, s/p GB, hepatic steatosis, calcif granulomas in liver, cyst in right kidney, atherosclerotic ulcer in infrarenal Ao, ectasia of iliacs...  HYPERTROPHY PROSTATE W/UR OBST & OTH LUTS (ICD-600.01) - saw DrNesi 7/10 w/ BPH, obstructive symptoms, and ED- given  Flomax & "shots"...  ~  Labs 10/12 showed PSA= 2.93 ~  10/14: he [facility] me [facility] [place] & have requested these...  DEGENERATIVE JOINT DISEASE (ICD-715.90) >>  ~  6/14:  He presented w/ swelling in the feet, but L>R w/ pain & tender laterally; then he admitted to dropping a monkey wrench on his foot 1 week ago; XRay showed subluxed/ dislocated 5th MTP=> to Ortho ASAP...  Hx of POLYMYALGIA RHEUMATICA (ICD-725) - hasn't req steroids x yrs... ~  labs 2/10 showed Sed= 8 ~  labs 10/10 showed Sed= 6 ~  labs 3/11 showed Sed= 51 w/ acute illness. ~  labs 10/11 showed Sed= 9 ~  Labs 10/12  showed Sed= 9  TREMOR (ICD-781.0) - [facility]- essential tremor w/ +FamHx in sister... trial Clonazepam 0.45m Bid w/ improvement...  ANXIETY (ICD-300.00) - he uses the same Clonazepam 0.236mPrn...  Hx of SHINGLES (ICD-053.9)  Thrombocytopenia >> serial labs w/ Plat counts betw 223 ==> 85; no bleeding diathesis, & he had acute intestinal vasc insuffic 2003 requiring embolectomy & has been on Coumadin Rx ever since then... ~  [place]Holmes, [place]; Plats ranged 67-73K & they stopped his 6MP Rx for crohn's dis & he needs f/u labs.  VITAMIN B12 DEFICIENCY (ICD-266.2) - on B 12 10005md OTC... ~  labs 2/10 showed B12 level = 215... rec start oral B12 supplement daily. ~  labs 10/10 showed B12 level = 416... continue oral B12 supplement. ~   labs 10/11 showed B12= 592... continue same. ~  Labs 10/12 showed B12= 573  VITAMIN D DEFICIENCY (ICD-268.9) - on Vit D 1000 u daily OTC... ~  labs 2/10 showed Vit D level = 23... rec> start Vit D 1000 u daily.. ~  Labs 10/12 showed Vit D level = 50   Past Surgical History  Procedure Laterality Date  . Coronary artery bypass graft  1994  . Laparoscopic cholecystectomy  2002  . Elap w/ superior mesenteric artery embolectomy  1/03    by Dr. LawJennette Banker Repair of incarcerated ventral hernia and lysis of adhesions  11/03    by Dr. HinBetsy Pries Implantation of biventric cardioverter-defibrillatorr      by Dr. TayLovena Le 8/06 [employer] [place]  . Implantation of guidant cognis device      model [id number] Left inguinal hernia repair with mesh  6/08    by Dr. HinBetsy Pries Prostate surgery  08/2012    [facility] Prescriptions as of 11/05/2012  Medication Sig Dispense Refill  . aspirin 81 MG tablet Take 81 mg by mouth daily.        . carvedilol (COREG) 6.25 MG tablet Take 1 tablet by mouth  twice a day with meals.  60 tablet  6  . cholecalciferol (VITAMIN D) 1000 UNITS tablet Take 1,000-2,000 Units by mouth daily.       . Cyanocobalamin (B-12) 1000 MCG CAPS Take 1 capsule by mouth daily.        . eMarland Kitchenlerenone (INSPRA) 25 MG tablet Take 1 tablet (25 mg total) by mouth daily.  90 tablet  3  . folic acid (FOLVITE) 1 MG tablet Take 1 mg by mouth daily.        . furosemide (LASIX) 40 MG tablet Take 1.5 tablets (60 mg  total) by mouth daily.  45 tablet  6  . lisinopril (PRINIVIL,ZESTRIL) 10 MG tablet Take 1 tablet (10 mg total) by mouth daily.  30 tablet  6  . simvastatin (ZOCOR) 20 MG tablet Take 1 tablet (20 mg total) by mouth at bedtime.  30 tablet  6  . warfarin (COUMADIN) 5 MG tablet Take 1 tablet by mouth as  directed  150 tablet  1   No facility-administered encounter medications on file as of 11/05/2012.    No Known Allergies   Current  Medications, Allergies, Past Medical History, Past Surgical History, Family History, and Social History were reviewed in [employer].    Review of Systems         See HPI -  all other systems neg except as noted... The patient complains of decreased hearing and dyspnea on exertion.  The patient denies anorexia, fever, weight loss, weight gain, vision loss, hoarseness, chest pain, syncope, peripheral edema, prolonged cough, headaches, hemoptysis, abdominal pain, melena, hematochezia, severe indigestion/heartburn, hematuria, incontinence, muscle weakness, suspicious skin lesions, transient blindness, difficulty walking, depression, unusual weight change, abnormal bleeding, enlarged lymph nodes, and angioedema.     Objective:   Physical Exam     WD, WN, 79 y/o WM in NAD... GENERAL:  Alert & oriented; pleasant & cooperative... HEENT:  [place]/AT, EOM-wnl, PERRLA, EACs-clear, TMs-wnl, NOSE- sl red/ inflamm, THROAT-clear & wnl. NECK:  Supple w/ fairROM; no JVD; normal carotid impulses w/o bruits; no thyromegaly or nodules palpated; no lymphadenopathy. CHEST:  Decr BS w/ few scat rhonchi, no wheezing, no rales, no signs of consolidation. HEART:  Regular Rhythm; gr 1/6 SEM without rubs or gallops detected... ABDOMEN:  Soft & nontender; mult scars, & ventral hernia; normal bowel sounds; no organomegaly or masses palpated. EXT: without deformities, mod arthritic changes; no varicose veins/ venous insuffic/ or edema. NEURO:  CN's intact; I do not apprec a tremor at present; no focal neuro deficits... DERM:  No lesions noted; no rash etc...  RADIOLOGY DATA:  Reviewed in the EPIC EMR & discussed w/ the patient...  LABORATORY DATA:  Reviewed in the EPIC EMR & discussed w/ the patient...   Assessment & Plan:    HBP>  BP controlled, continue same meds, except change from Aldactone to Eplerenone, and try to incr activity...  CAD/ Cardiomyopathy>  Followed by [name] & DrKlein,  their notes are reviewed...  Hyperlipid>  On Simva20 + diet> continue same...  GI>  [place] now [facility] as [facility]dhury has moved [facility]; continue meds & close f/u w/ GI; now off 6MP rx...  Hx vasc insuffic intestines>  SMA embolectomy 2003, he remains on Coumadin via the CC...  Hx SBO requiring Hosp 3/13 but resolved w/ conserv approach per DrHIngram, [facility]...  Left FOOT PAIN => dislocated left 5th MTP joint; seen by DrHilts who felt this was chronic, treated edema w/ compression hose & swelling is down...  Other medical problems as noted...   Patient's Medications  New Prescriptions   No medications on file  Previous Medications   ASPIRIN 81 MG TABLET    Take 81 mg by mouth daily.     CARVEDILOL (COREG) 6.25 MG TABLET    Take 1 tablet by mouth  twice a day with meals.   CHOLECALCIFEROL (VITAMIN D) 1000 UNITS TABLET    Take 1,000-2,000 Units by mouth daily.    CYANOCOBALAMIN (B-12) 1000 MCG CAPS    Take 1 capsule by mouth daily.     EPLERENONE (INSPRA) 25 MG TABLET    Take 1 tablet (25 mg total) by mouth daily.   FOLIC ACID (FOLVITE) 1 MG TABLET    Take 1 mg by mouth daily.     FUROSEMIDE (LASIX) 40 MG TABLET    Take 1.5 tablets (60 mg  total) by mouth daily.   LISINOPRIL (PRINIVIL,ZESTRIL) 10 MG TABLET    Take 1 tablet (10 mg total) by mouth daily.   SIMVASTATIN (ZOCOR) 20 MG TABLET    Take 1 tablet (20 mg total) by mouth at bedtime.   WARFARIN (COUMADIN) 5 MG TABLET    Take 1 tablet by mouth as  directed  Modified Medications   No medications on file  Discontinued Medications   No medications on file

## 2012-11-05 NOTE — Patient Instructions (Addendum)
Today we updated your med list in our EPIC system...    Continue your current medications the same...  We gave you the 2014 FLU vaccine today...  Call for any questions...  Let's plan a follow up visit in 82mow/ fasting blood work at that time..Marland KitchenMarland Kitchen

## 2012-11-06 DIAGNOSIS — N210 Calculus in bladder: Secondary | ICD-10-CM | POA: Diagnosis not present

## 2012-11-06 DIAGNOSIS — N400 Enlarged prostate without lower urinary tract symptoms: Secondary | ICD-10-CM | POA: Diagnosis not present

## 2012-11-06 DIAGNOSIS — N390 Urinary tract infection, site not specified: Secondary | ICD-10-CM | POA: Diagnosis not present

## 2012-11-06 DIAGNOSIS — N318 Other neuromuscular dysfunction of bladder: Secondary | ICD-10-CM | POA: Diagnosis not present

## 2012-11-07 ENCOUNTER — Ambulatory Visit (INDEPENDENT_AMBULATORY_CARE_PROVIDER_SITE_OTHER): Payer: Medicare Other | Admitting: Pharmacist

## 2012-11-07 DIAGNOSIS — K55059 Acute (reversible) ischemia of intestine, part and extent unspecified: Secondary | ICD-10-CM

## 2012-11-07 DIAGNOSIS — K55069 Acute infarction of intestine, part and extent unspecified: Secondary | ICD-10-CM

## 2012-11-07 LAB — POCT INR: INR: 2.3

## 2012-11-13 ENCOUNTER — Ambulatory Visit: Payer: Medicare Other | Admitting: Cardiology

## 2012-11-15 ENCOUNTER — Ambulatory Visit (INDEPENDENT_AMBULATORY_CARE_PROVIDER_SITE_OTHER): Payer: Medicare Other | Admitting: *Deleted

## 2012-11-15 ENCOUNTER — Encounter: Payer: Self-pay | Admitting: Internal Medicine

## 2012-11-15 DIAGNOSIS — I2589 Other forms of chronic ischemic heart disease: Secondary | ICD-10-CM | POA: Diagnosis not present

## 2012-11-15 DIAGNOSIS — Z95810 Presence of automatic (implantable) cardiac defibrillator: Secondary | ICD-10-CM | POA: Diagnosis not present

## 2012-11-15 DIAGNOSIS — N390 Urinary tract infection, site not specified: Secondary | ICD-10-CM | POA: Diagnosis not present

## 2012-11-15 DIAGNOSIS — I5022 Chronic systolic (congestive) heart failure: Secondary | ICD-10-CM | POA: Diagnosis not present

## 2012-11-15 DIAGNOSIS — N400 Enlarged prostate without lower urinary tract symptoms: Secondary | ICD-10-CM | POA: Diagnosis not present

## 2012-11-15 DIAGNOSIS — N318 Other neuromuscular dysfunction of bladder: Secondary | ICD-10-CM | POA: Diagnosis not present

## 2012-11-15 DIAGNOSIS — N529 Male erectile dysfunction, unspecified: Secondary | ICD-10-CM | POA: Diagnosis not present

## 2012-11-19 ENCOUNTER — Ambulatory Visit (INDEPENDENT_AMBULATORY_CARE_PROVIDER_SITE_OTHER): Payer: Medicare Other | Admitting: Cardiology

## 2012-11-19 ENCOUNTER — Encounter: Payer: Self-pay | Admitting: Cardiology

## 2012-11-19 VITALS — BP 118/56 | HR 96 | Ht 72.0 in | Wt 204.4 lb

## 2012-11-19 DIAGNOSIS — Z7901 Long term (current) use of anticoagulants: Secondary | ICD-10-CM

## 2012-11-19 DIAGNOSIS — I5022 Chronic systolic (congestive) heart failure: Secondary | ICD-10-CM

## 2012-11-19 LAB — REMOTE ICD DEVICE
AL IMPEDENCE ICD: 442 Ohm
ATRIAL PACING ICD: 76 pct
LV LEAD IMPEDENCE ICD: 392 Ohm
RV LEAD IMPEDENCE ICD: 575 Ohm
TZAT-0001FASTVT: 1
TZAT-0002FASTVT: NEGATIVE
TZAT-0013FASTVT: 2
TZAT-0018FASTVT: NEGATIVE
TZAT-0018FASTVT: NEGATIVE
TZST-0001FASTVT: 3
TZST-0001FASTVT: 4
TZST-0001FASTVT: 7
TZST-0003FASTVT: 23 J
TZST-0003FASTVT: 41 J
TZST-0003FASTVT: 41 J
TZST-0003FASTVT: 41 J
VENTRICULAR PACING ICD: 96 pct

## 2012-11-19 NOTE — Progress Notes (Signed)
HPI  Patient is seen today to followup his cardiomyopathy. He is actually doing very well. His volume status is controlled. He is on all appropriate medications. He had breast tenderness from spironolactone. He does well with eplerinone. He's not having any chest pain or shortness of breath.  No Known Allergies  Current Outpatient Prescriptions  Medication Sig Dispense Refill  . aspirin 81 MG tablet Take 81 mg by mouth daily.        . carvedilol (COREG) 6.25 MG tablet Take 1 tablet by mouth  twice a day with meals.  60 tablet  6  . cholecalciferol (VITAMIN D) 1000 UNITS tablet Take 1,000-2,000 Units by mouth daily.       . Cyanocobalamin (B-12) 1000 MCG CAPS Take 1 capsule by mouth daily.        [name] eplerenone (INSPRA) 25 MG tablet Take 1 tablet (25 mg total) by mouth daily.  90 tablet  3  . folic acid (FOLVITE) 1 MG tablet Take 1 mg by mouth daily.        . furosemide (LASIX) 40 MG tablet Take 1.5 tablets (60 mg  total) by mouth daily.  45 tablet  6  . lisinopril (PRINIVIL,ZESTRIL) 10 MG tablet Take 1 tablet (10 mg total) by mouth daily.  30 tablet  6  . simvastatin (ZOCOR) 20 MG tablet Take 1 tablet (20 mg total) by mouth at bedtime.  30 tablet  6  . warfarin (COUMADIN) 5 MG tablet Take 1 tablet by mouth as  directed  150 tablet  1   No current facility-administered medications for this visit.    History   Social History  . Marital Status: Married    Spouse Name: [name]    Number of Children: 3  . 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 Smoking status: Former Smoker -- 1.00 packs/day for 20 years    Types: Cigarettes    Quit date: 01/31/1973  . Smokeless tobacco: Never Used  . Alcohol Use: Yes     Comment: occasional  . Drug Use: No  . Sexual Activity: Not on file   Other Topics Concern  . Not on file   Social History Narrative   Lives in [place], [place] with wife.     Family  History  Problem Relation Age of Onset  . Heart disease Mother   . Hypertension    . Hyperlipidemia    . Tremor Sister   . Heart disease Father 72    Deceased   . Cancer Brother     Past Medical History  Diagnosis Date  . Dizziness     Positional dizziness  . Hypertension   . Ischemic cardiomyopathy     CABG 1994 CAD catherization 1996.[name] occluded vein graft to the diagnol, other grafts patent/  catherization 2006, no PCI/ nuclear.[name] october 2011.[name] extensive scar anteroseptal and apical.. no ischemia    . CHF (congestive heart failure)     chronic systolic  . Mitral regurgitation     mild.[name] echo november 2011  EF 35%...echo.. november 2009/ ef 35%.[name] echo november 2011  . Sinus bradycardia   . Chronotropic incompetence   . Biventricular implantable cardiac defibrillator in situ     GDT CONTAK H170-MADIT-CRT-EXPLANTED 2011 implanted defibrillator- [employer] cognis, model n119-2001 Dr. Caryl Comes (11/28/2009)  . LBBB (left bundle branch block)   . Hyperlipemia   .  GERD (gastroesophageal reflux disease)   . Colonic polyp   . Crohn's disease   . Acute vascular insufficiency of intestine     Mesenteric embolus...surgery 2003  . Ventral hernia   . Hypertrophy of prostate with urinary obstruction and other lower urinary tract symptoms (LUTS)   . Degenerative joint disease   . Polymyalgia rheumatica   . Tremor   . Anxiety   . Shingles   . Vitamin B12 deficiency   . CAD (coronary artery disease)     CABG 1994  /   catheterization 1996, occluded vein graft to the diagonal, other grafts patent  /   catheterization 2006, no PCI  /  nuclear, October, 2011, extensive scar anteroseptal and apical, no ischemia  . Hx of CABG     1994  . Ejection fraction     EF 35%, echo, November, 2011  //   Is 35-40%, echo,  September 23, 2011  . Warfarin anticoagulation     Coumadin therapy for mesenteric embolus 2003  . Carotid artery disease     Doppler, February,  2012, 0-39% bilateral, recommended followup  1 year  . Edema leg     Venous Dopplers 8/13: No DVT bilaterally  . Breast tenderness     Spironolactone was stopped and eplerinone started. Patient feels better    Past Surgical History  Procedure Laterality Date  . Coronary artery bypass graft  1994  . Laparoscopic cholecystectomy  2002  . Elap w/ superior mesenteric artery embolectomy  1/03    by Dr. Jennette Banker  . Repair of incarcerated ventral hernia and lysis of adhesions  11/03    by Dr. Betsy Pries  . Implantation of biventric cardioverter-defibrillatorr      by Dr. Lovena Le in 8/06 [employer] 2011  . Implantation of guidant cognis device      model [id number]  . Left inguinal hernia repair with mesh  6/08    by Dr. Betsy Pries  . Prostate surgery  08/2012    by urology in [place]    Patient Active Problem List   Diagnosis Date Noted  . Thrombocytopenia 11/30/2011  . Ejection fraction   . Edema leg   . OSA (obstructive sleep apnea) 09/16/2011  . Abdominal pain 04/14/2011  . SBO (small bowel obstruction) 04/14/2011  . CAD (coronary artery disease)   . Hx of CABG   . Dizziness   . Hypertension   . Chronic systolic heart failure   . Mitral regurgitation   . Sinus bradycardia   . Chronotropic incompetence   . Hyperlipemia   . Crohn's disease   . Ventral hernia   . Tremor   . Warfarin anticoagulation   . Carotid artery disease   . 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 IMPLANTABLE DEFIBRILLATOR, GDT CONTAK H170-MADIT-CRT 12/09/2009  . HYPERTROPHY PROSTATE W/UR OBST & OTH LUTS 03/31/2009  . BRONCHITIS, ACUTE 03/30/2009  . SINUSITIS 12/02/2008  . VITAMIN B12 DEFICIENCY 11/06/2008  . VITAMIN D DEFICIENCY 11/06/2008  . CARDIOMYOPATHY, ISCHEMIC  s/p CABG   09/15/2008  . LBBB 06/25/2008  . FATIGUE 06/25/2008  . COLONIC POLYPS 03/23/2007  . ACUTE VASCULAR INSUFFICIENCY OF INTESTINE 03/23/2007  . POLYMYALGIA RHEUMATICA 03/23/2007  . SHINGLES 02/21/2007  . ANXIETY 02/21/2007  . GERD 02/21/2007  . DEGENERATIVE  JOINT DISEASE 02/21/2007    ROS   Patient denies fever, chills, headache, sweats, rash, change in vision, change in hearing, chest pain, cough, nausea or vomiting, urinary symptoms. All other systems are reviewed and are negative.  PHYSICAL EXAM   Patient is oriented to person time and place. Affect is normal. He's here with his wife. There is no jugulovenous distention. Lungs are clear. Respiratory effort is nonlabored. Cardiac exam reveals S1 and S2. There no clicks or significant murmurs. The abdomen is soft. There is no peripheral edema.  Filed Vitals:   11/19/12 1540  BP: 118/56  Pulse: 96  Height: 6' (1.829 m)  Weight: 204 lb 6.4 oz (92.715 kg)  SpO2: 91%     ASSESSMENT & PLAN

## 2012-11-19 NOTE — Assessment & Plan Note (Signed)
His volume status is stable. He is on all appropriate medications. No further workup.

## 2012-11-19 NOTE — Patient Instructions (Signed)
**Note De-identified  Obfuscation** Your physician recommends that you continue on your current medications as directed. Please refer to the Current Medication list given to you today.  Your physician wants you to follow-up in: 6 months. You will receive a reminder letter in the mail two months in advance. If you don't receive a letter, please call our office to schedule the follow-up appointment.  

## 2012-11-19 NOTE — Assessment & Plan Note (Signed)
Coumadin is to be continued for his history of mesenteric embolus.

## 2012-11-28 ENCOUNTER — Ambulatory Visit (INDEPENDENT_AMBULATORY_CARE_PROVIDER_SITE_OTHER): Payer: Medicare Other | Admitting: *Deleted

## 2012-11-28 DIAGNOSIS — K55059 Acute (reversible) ischemia of intestine, part and extent unspecified: Secondary | ICD-10-CM | POA: Diagnosis not present

## 2012-11-28 DIAGNOSIS — K55069 Acute infarction of intestine, part and extent unspecified: Secondary | ICD-10-CM

## 2012-11-28 LAB — POCT INR: INR: 2.6

## 2012-12-07 ENCOUNTER — Encounter: Payer: Self-pay | Admitting: *Deleted

## 2012-12-26 ENCOUNTER — Ambulatory Visit (INDEPENDENT_AMBULATORY_CARE_PROVIDER_SITE_OTHER): Payer: Medicare Other | Admitting: *Deleted

## 2012-12-26 DIAGNOSIS — K55059 Acute (reversible) ischemia of intestine, part and extent unspecified: Secondary | ICD-10-CM

## 2012-12-26 DIAGNOSIS — K55069 Acute infarction of intestine, part and extent unspecified: Secondary | ICD-10-CM

## 2012-12-26 LAB — POCT INR: INR: 2.5

## 2013-01-08 ENCOUNTER — Other Ambulatory Visit: Payer: Self-pay | Admitting: Cardiology

## 2013-01-08 ENCOUNTER — Other Ambulatory Visit: Payer: Self-pay | Admitting: Pulmonary Disease

## 2013-01-30 ENCOUNTER — Ambulatory Visit (INDEPENDENT_AMBULATORY_CARE_PROVIDER_SITE_OTHER): Payer: Medicare Other | Admitting: Pharmacist

## 2013-01-30 DIAGNOSIS — K55069 Acute infarction of intestine, part and extent unspecified: Secondary | ICD-10-CM

## 2013-01-30 DIAGNOSIS — K55059 Acute (reversible) ischemia of intestine, part and extent unspecified: Secondary | ICD-10-CM | POA: Diagnosis not present

## 2013-02-07 ENCOUNTER — Telehealth: Payer: Self-pay | Admitting: Pulmonary Disease

## 2013-02-07 MED ORDER — AZITHROMYCIN 250 MG PO TABS: ORAL_TABLET | ORAL | Status: DC

## 2013-02-07 NOTE — Telephone Encounter (Signed)
Called and spoke with pts wife and she stated that the pt has been having dry cough that is worse at night, head congestion, sore throat x 2 days  but denies any fever.  Pt is afraid to take anything OTC due to his heart condition.  Pt is requesting that something be called to the pharmacy.  SN please advise. Thanks  No Known Allergies   Current Outpatient Prescriptions on File Prior to Visit  Medication Sig Dispense Refill  . aspirin 81 MG tablet Take 81 mg by mouth daily.        . carvedilol (COREG) 6.25 MG tablet Take 1 tablet by mouth  twice a day with meals.  60 tablet  6  . cholecalciferol (VITAMIN D) 1000 UNITS tablet Take 1,000-2,000 Units by mouth daily.       . Cyanocobalamin (B-12) 1000 MCG CAPS Take 1 capsule by mouth daily.        [name] eplerenone (INSPRA) 25 MG tablet Take 1 tablet by mouth  daily  90 tablet  1  . folic acid (FOLVITE) 1 MG tablet Take 1 mg by mouth daily.        . furosemide (LASIX) 40 MG tablet Take 1.5 tablets (60 mg  total) by mouth daily.  45 tablet  6  . lisinopril (PRINIVIL,ZESTRIL) 10 MG tablet Take 1 tablet (10 mg total) by mouth daily.  30 tablet  6  . simvastatin (ZOCOR) 20 MG tablet Take 1 tablet (20 mg total) by mouth at bedtime.  30 tablet  6  . warfarin (COUMADIN) 5 MG tablet Take as directed by anticoagulation clinic  150 tablet  1   No current facility-administered medications on file prior to visit.

## 2013-02-07 NOTE — Telephone Encounter (Signed)
Per SN: pt can use mucinex, delsym, increase fluids, and call in zpak # 1 take as directed no refills.  -------  Called, spoke with pt's wife.  Informed her of above per Dr. Lenna Gilford.  She verbalized understanding of this, is aware zpak rx was sent to [facility], and will let the [facility] know pt is on zpak.  They are to call back if symptoms do not improve or worsen.

## 2013-02-08 ENCOUNTER — Telehealth: Payer: Self-pay | Admitting: Pulmonary Disease

## 2013-02-08 NOTE — Telephone Encounter (Signed)
Called and spoke with [facility] and they have the zpak ready to be picked up.  i called and spoke with pts wife and she stated that she called the pharmacy x 2 and they told her that they never received this rx.  She is aware that this rx is ready to be picked up.  Nothing further is needed.

## 2013-02-14 ENCOUNTER — Encounter: Payer: Medicare Other | Admitting: *Deleted

## 2013-02-14 ENCOUNTER — Encounter: Payer: Self-pay | Admitting: Internal Medicine

## 2013-02-14 DIAGNOSIS — I5022 Chronic systolic (congestive) heart failure: Secondary | ICD-10-CM

## 2013-02-14 DIAGNOSIS — D4100 Neoplasm of uncertain behavior of unspecified kidney: Secondary | ICD-10-CM | POA: Diagnosis not present

## 2013-02-14 DIAGNOSIS — N390 Urinary tract infection, site not specified: Secondary | ICD-10-CM | POA: Diagnosis not present

## 2013-02-14 DIAGNOSIS — Z95810 Presence of automatic (implantable) cardiac defibrillator: Secondary | ICD-10-CM

## 2013-02-14 DIAGNOSIS — N318 Other neuromuscular dysfunction of bladder: Secondary | ICD-10-CM | POA: Diagnosis not present

## 2013-02-14 DIAGNOSIS — D4120 Neoplasm of uncertain behavior of unspecified ureter: Secondary | ICD-10-CM | POA: Diagnosis not present

## 2013-02-14 DIAGNOSIS — I2589 Other forms of chronic ischemic heart disease: Secondary | ICD-10-CM

## 2013-02-14 DIAGNOSIS — N400 Enlarged prostate without lower urinary tract symptoms: Secondary | ICD-10-CM | POA: Diagnosis not present

## 2013-02-14 LAB — MDC_IDC_ENUM_SESS_TYPE_REMOTE
Brady Statistic RA Percent Paced: 80 %
Brady Statistic RV Percent Paced: 96 %
HIGH POWER IMPEDANCE MEASURED VALUE: 56 Ohm
Lead Channel Impedance Value: 423 Ohm
Lead Channel Sensing Intrinsic Amplitude: 20.8 mV
Lead Channel Setting Pacing Amplitude: 2.4 V
Lead Channel Setting Pacing Pulse Width: 0.8 ms
MDC IDC MSMT LEADCHNL RA IMPEDANCE VALUE: 441 Ohm
MDC IDC MSMT LEADCHNL RA SENSING INTR AMPL: 3.4 mV
MDC IDC MSMT LEADCHNL RV IMPEDANCE VALUE: 538 Ohm
MDC IDC PG SERIAL: 490477
MDC IDC SET LEADCHNL LV PACING AMPLITUDE: 2 V
MDC IDC SET LEADCHNL RA PACING AMPLITUDE: 2 V
MDC IDC SET LEADCHNL RV PACING PULSEWIDTH: 0.4 ms
Zone Setting Detection Interval: 285.7 ms
Zone Setting Detection Interval: 333.3 ms

## 2013-02-15 DIAGNOSIS — K7460 Unspecified cirrhosis of liver: Secondary | ICD-10-CM | POA: Diagnosis not present

## 2013-02-15 DIAGNOSIS — K439 Ventral hernia without obstruction or gangrene: Secondary | ICD-10-CM | POA: Diagnosis not present

## 2013-02-15 DIAGNOSIS — N289 Disorder of kidney and ureter, unspecified: Secondary | ICD-10-CM | POA: Diagnosis not present

## 2013-02-15 DIAGNOSIS — N200 Calculus of kidney: Secondary | ICD-10-CM | POA: Diagnosis not present

## 2013-02-15 DIAGNOSIS — I708 Atherosclerosis of other arteries: Secondary | ICD-10-CM | POA: Diagnosis not present

## 2013-02-19 DIAGNOSIS — R310 Gross hematuria: Secondary | ICD-10-CM | POA: Diagnosis not present

## 2013-02-19 DIAGNOSIS — N210 Calculus in bladder: Secondary | ICD-10-CM | POA: Diagnosis not present

## 2013-02-19 DIAGNOSIS — N529 Male erectile dysfunction, unspecified: Secondary | ICD-10-CM | POA: Diagnosis not present

## 2013-02-19 DIAGNOSIS — D4100 Neoplasm of uncertain behavior of unspecified kidney: Secondary | ICD-10-CM | POA: Diagnosis not present

## 2013-02-20 ENCOUNTER — Ambulatory Visit (INDEPENDENT_AMBULATORY_CARE_PROVIDER_SITE_OTHER): Payer: Medicare Other | Admitting: Pharmacist

## 2013-02-20 DIAGNOSIS — K55069 Acute infarction of intestine, part and extent unspecified: Secondary | ICD-10-CM

## 2013-02-20 DIAGNOSIS — K55059 Acute (reversible) ischemia of intestine, part and extent unspecified: Secondary | ICD-10-CM | POA: Diagnosis not present

## 2013-02-20 LAB — POCT INR: INR: 2.2

## 2013-03-06 ENCOUNTER — Encounter: Payer: Self-pay | Admitting: *Deleted

## 2013-03-11 ENCOUNTER — Other Ambulatory Visit (INDEPENDENT_AMBULATORY_CARE_PROVIDER_SITE_OTHER): Payer: Medicare Other

## 2013-03-11 ENCOUNTER — Encounter: Payer: Self-pay | Admitting: Pulmonary Disease

## 2013-03-11 ENCOUNTER — Ambulatory Visit (INDEPENDENT_AMBULATORY_CARE_PROVIDER_SITE_OTHER): Payer: Medicare Other | Admitting: Pulmonary Disease

## 2013-03-11 VITALS — BP 136/82 | HR 67 | Temp 97.0°F | Ht 72.0 in | Wt 209.2 lb

## 2013-03-11 DIAGNOSIS — G4733 Obstructive sleep apnea (adult) (pediatric): Secondary | ICD-10-CM | POA: Diagnosis not present

## 2013-03-11 DIAGNOSIS — Z23 Encounter for immunization: Secondary | ICD-10-CM

## 2013-03-11 DIAGNOSIS — E785 Hyperlipidemia, unspecified: Secondary | ICD-10-CM

## 2013-03-11 DIAGNOSIS — I2510 Atherosclerotic heart disease of native coronary artery without angina pectoris: Secondary | ICD-10-CM | POA: Diagnosis not present

## 2013-03-11 DIAGNOSIS — D126 Benign neoplasm of colon, unspecified: Secondary | ICD-10-CM

## 2013-03-11 DIAGNOSIS — F411 Generalized anxiety disorder: Secondary | ICD-10-CM

## 2013-03-11 DIAGNOSIS — N138 Other obstructive and reflux uropathy: Secondary | ICD-10-CM

## 2013-03-11 DIAGNOSIS — I2589 Other forms of chronic ischemic heart disease: Secondary | ICD-10-CM

## 2013-03-11 DIAGNOSIS — M1990 Unspecified osteoarthritis, unspecified site: Secondary | ICD-10-CM

## 2013-03-11 DIAGNOSIS — D696 Thrombocytopenia, unspecified: Secondary | ICD-10-CM

## 2013-03-11 DIAGNOSIS — N401 Enlarged prostate with lower urinary tract symptoms: Secondary | ICD-10-CM

## 2013-03-11 DIAGNOSIS — K5090 Crohn's disease, unspecified, without complications: Secondary | ICD-10-CM

## 2013-03-11 DIAGNOSIS — J329 Chronic sinusitis, unspecified: Secondary | ICD-10-CM | POA: Diagnosis not present

## 2013-03-11 DIAGNOSIS — I5022 Chronic systolic (congestive) heart failure: Secondary | ICD-10-CM

## 2013-03-11 DIAGNOSIS — R251 Tremor, unspecified: Secondary | ICD-10-CM

## 2013-03-11 DIAGNOSIS — K219 Gastro-esophageal reflux disease without esophagitis: Secondary | ICD-10-CM

## 2013-03-11 DIAGNOSIS — I10 Essential (primary) hypertension: Secondary | ICD-10-CM

## 2013-03-11 DIAGNOSIS — Z95810 Presence of automatic (implantable) cardiac defibrillator: Secondary | ICD-10-CM

## 2013-03-11 LAB — CBC WITH DIFFERENTIAL/PLATELET
BASOS ABS: 0 10*3/uL (ref 0.0–0.1)
Basophils Relative: 0.4 % (ref 0.0–3.0)
Eosinophils Absolute: 0.2 10*3/uL (ref 0.0–0.7)
Eosinophils Relative: 2.3 % (ref 0.0–5.0)
HEMATOCRIT: 48.2 % (ref 39.0–52.0)
Hemoglobin: 16.1 g/dL (ref 13.0–17.0)
LYMPHS ABS: 1.9 10*3/uL (ref 0.7–4.0)
Lymphocytes Relative: 27.8 % (ref 12.0–46.0)
MCHC: 33.5 g/dL (ref 30.0–36.0)
MCV: 91.9 fl (ref 78.0–100.0)
MONO ABS: 1 10*3/uL (ref 0.1–1.0)
MONOS PCT: 14.2 % — AB (ref 3.0–12.0)
NEUTROS ABS: 3.7 10*3/uL (ref 1.4–7.7)
Neutrophils Relative %: 55.3 % (ref 43.0–77.0)
Platelets: 72 10*3/uL — ABNORMAL LOW (ref 150.0–400.0)
RBC: 5.24 Mil/uL (ref 4.22–5.81)
RDW: 14.9 % — AB (ref 11.5–14.6)
WBC: 6.8 10*3/uL (ref 4.5–10.5)

## 2013-03-11 LAB — TSH: TSH: 1.59 u[IU]/mL (ref 0.35–5.50)

## 2013-03-11 LAB — BASIC METABOLIC PANEL
BUN: 11 mg/dL (ref 6–23)
CALCIUM: 9.5 mg/dL (ref 8.4–10.5)
CO2: 29 mEq/L (ref 19–32)
CREATININE: 1 mg/dL (ref 0.4–1.5)
Chloride: 104 mEq/L (ref 96–112)
GFR: 81.12 mL/min (ref >60.00)
GLUCOSE: 90 mg/dL (ref 70–99)
Potassium: 4.2 mEq/L (ref 3.5–5.1)
Sodium: 140 mEq/L (ref 135–145)

## 2013-03-11 LAB — LIPID PANEL
Cholesterol: 170 mg/dL (ref 0–200)
HDL: 60.9 mg/dL (ref >39.00)
LDL Cholesterol: 74 mg/dL (ref 0–99)
TRIGLYCERIDES: 175 mg/dL — AB (ref 0.0–149.0)
Total CHOL/HDL Ratio: 3
VLDL: 35 mg/dL (ref 0.0–40.0)

## 2013-03-11 LAB — HEPATIC FUNCTION PANEL
ALK PHOS: 72 U/L (ref 39–117)
ALT: 28 U/L (ref 0–53)
AST: 35 U/L (ref 0–37)
Albumin: 4 g/dL (ref 3.5–5.2)
BILIRUBIN DIRECT: 0.4 mg/dL — AB (ref 0.0–0.3)
Total Bilirubin: 3.5 mg/dL — ABNORMAL HIGH (ref 0.3–1.2)
Total Protein: 6.5 g/dL (ref 6.0–8.3)

## 2013-03-11 LAB — SEDIMENTATION RATE: Sed Rate: 2 mm/hr (ref 0–22)

## 2013-03-11 MED ORDER — FLUTICASONE PROPIONATE 50 MCG/ACT NA SUSP: NASAL | Status: DC

## 2013-03-11 MED ORDER — AZELASTINE HCL 0.1 % NA SOLN: NASAL | Status: DC

## 2013-03-11 NOTE — Patient Instructions (Signed)
Today we updated your med list in our EPIC system...    Continue your current medications the same...  For your nasal congestion>>    Use a SALINE nonmedicated spray every 1-2h while awake...    Use the ASTELIN nasal spray- 1 to 2 sprays in each nostril twice daily for the dripping...    Use the FLONASE nasal spray- 2 sprays in each nostril at bedtime...  For the Plantar fasciitis>>    Try the gentle stretching exercise several times daily as we discussed...    You may also try the soup can, vs water bottle to roll your foot on...    Ok to use Tylenol, Aleve, etc if needed...  We gave you the Pneumonia vaccine and the combination Tetanus shot called the TDAP today...  Today we did your FASTING blood work...    We will contact you w/ the results when available...   Call for any questions...  Let's plan a follow up visit in 30mo sooner if needed for problems..Marland KitchenMarland Kitchen

## 2013-03-12 ENCOUNTER — Other Ambulatory Visit: Payer: Self-pay | Admitting: Dermatology

## 2013-03-12 DIAGNOSIS — C44621 Squamous cell carcinoma of skin of unspecified upper limb, including shoulder: Secondary | ICD-10-CM | POA: Diagnosis not present

## 2013-03-12 DIAGNOSIS — Z85820 Personal history of malignant melanoma of skin: Secondary | ICD-10-CM | POA: Diagnosis not present

## 2013-03-12 DIAGNOSIS — L819 Disorder of pigmentation, unspecified: Secondary | ICD-10-CM | POA: Diagnosis not present

## 2013-03-12 DIAGNOSIS — L821 Other seborrheic keratosis: Secondary | ICD-10-CM | POA: Diagnosis not present

## 2013-03-12 DIAGNOSIS — L570 Actinic keratosis: Secondary | ICD-10-CM | POA: Diagnosis not present

## 2013-03-12 DIAGNOSIS — D1801 Hemangioma of skin and subcutaneous tissue: Secondary | ICD-10-CM | POA: Diagnosis not present

## 2013-03-20 ENCOUNTER — Ambulatory Visit (INDEPENDENT_AMBULATORY_CARE_PROVIDER_SITE_OTHER): Payer: Medicare Other | Admitting: Pharmacist

## 2013-03-20 DIAGNOSIS — K55059 Acute (reversible) ischemia of intestine, part and extent unspecified: Secondary | ICD-10-CM

## 2013-03-20 DIAGNOSIS — K55069 Acute infarction of intestine, part and extent unspecified: Secondary | ICD-10-CM

## 2013-03-20 LAB — POCT INR: INR: 2.2

## 2013-04-02 DIAGNOSIS — H26499 Other secondary cataract, unspecified eye: Secondary | ICD-10-CM | POA: Diagnosis not present

## 2013-04-02 DIAGNOSIS — H02409 Unspecified ptosis of unspecified eyelid: Secondary | ICD-10-CM | POA: Diagnosis not present

## 2013-04-16 ENCOUNTER — Telehealth: Payer: Self-pay | Admitting: Pulmonary Disease

## 2013-04-16 ENCOUNTER — Inpatient Hospital Stay (HOSPITAL_COMMUNITY)
Admission: EM | Admit: 2013-04-16 | Discharge: 2013-04-22 | DRG: 377 | Disposition: A | Discharge status: Home / Self Care | Payer: Medicare Other | Attending: Pulmonary Disease | Admitting: Pulmonary Disease

## 2013-04-16 ENCOUNTER — Encounter (HOSPITAL_COMMUNITY): Payer: Self-pay | Admitting: Emergency Medicine

## 2013-04-16 ENCOUNTER — Other Ambulatory Visit (HOSPITAL_COMMUNITY): Payer: Self-pay | Admitting: *Deleted

## 2013-04-16 DIAGNOSIS — Z860100 Personal history of colon polyps, unspecified: Secondary | ICD-10-CM

## 2013-04-16 DIAGNOSIS — D500 Iron deficiency anemia secondary to blood loss (chronic): Secondary | ICD-10-CM | POA: Diagnosis present

## 2013-04-16 DIAGNOSIS — R7309 Other abnormal glucose: Secondary | ICD-10-CM | POA: Diagnosis present

## 2013-04-16 DIAGNOSIS — E785 Hyperlipidemia, unspecified: Secondary | ICD-10-CM | POA: Diagnosis present

## 2013-04-16 DIAGNOSIS — D62 Acute posthemorrhagic anemia: Secondary | ICD-10-CM

## 2013-04-16 DIAGNOSIS — R000 Tachycardia, unspecified: Secondary | ICD-10-CM

## 2013-04-16 DIAGNOSIS — Z951 Presence of aortocoronary bypass graft: Secondary | ICD-10-CM

## 2013-04-16 DIAGNOSIS — I5022 Chronic systolic (congestive) heart failure: Secondary | ICD-10-CM | POA: Diagnosis not present

## 2013-04-16 DIAGNOSIS — E870 Hyperosmolality and hypernatremia: Secondary | ICD-10-CM | POA: Diagnosis not present

## 2013-04-16 DIAGNOSIS — K5090 Crohn's disease, unspecified, without complications: Secondary | ICD-10-CM

## 2013-04-16 DIAGNOSIS — I2589 Other forms of chronic ischemic heart disease: Secondary | ICD-10-CM | POA: Diagnosis present

## 2013-04-16 DIAGNOSIS — I2510 Atherosclerotic heart disease of native coronary artery without angina pectoris: Secondary | ICD-10-CM | POA: Diagnosis not present

## 2013-04-16 DIAGNOSIS — G9340 Encephalopathy, unspecified: Secondary | ICD-10-CM | POA: Diagnosis not present

## 2013-04-16 DIAGNOSIS — I509 Heart failure, unspecified: Secondary | ICD-10-CM | POA: Diagnosis present

## 2013-04-16 DIAGNOSIS — K922 Gastrointestinal hemorrhage, unspecified: Secondary | ICD-10-CM

## 2013-04-16 DIAGNOSIS — Z95810 Presence of automatic (implantable) cardiac defibrillator: Secondary | ICD-10-CM

## 2013-04-16 DIAGNOSIS — Z7901 Long term (current) use of anticoagulants: Secondary | ICD-10-CM | POA: Diagnosis not present

## 2013-04-16 DIAGNOSIS — R791 Abnormal coagulation profile: Secondary | ICD-10-CM | POA: Diagnosis present

## 2013-04-16 DIAGNOSIS — G4733 Obstructive sleep apnea (adult) (pediatric): Secondary | ICD-10-CM

## 2013-04-16 DIAGNOSIS — K55069 Acute infarction of intestine, part and extent unspecified: Secondary | ICD-10-CM

## 2013-04-16 DIAGNOSIS — D689 Coagulation defect, unspecified: Secondary | ICD-10-CM | POA: Diagnosis not present

## 2013-04-16 DIAGNOSIS — D126 Benign neoplasm of colon, unspecified: Secondary | ICD-10-CM | POA: Diagnosis present

## 2013-04-16 DIAGNOSIS — K921 Melena: Principal | ICD-10-CM

## 2013-04-16 DIAGNOSIS — J438 Other emphysema: Secondary | ICD-10-CM | POA: Diagnosis not present

## 2013-04-16 DIAGNOSIS — I447 Left bundle-branch block, unspecified: Secondary | ICD-10-CM | POA: Diagnosis present

## 2013-04-16 DIAGNOSIS — Z7982 Long term (current) use of aspirin: Secondary | ICD-10-CM

## 2013-04-16 DIAGNOSIS — T45515A Adverse effect of anticoagulants, initial encounter: Secondary | ICD-10-CM

## 2013-04-16 DIAGNOSIS — M353 Polymyalgia rheumatica: Secondary | ICD-10-CM | POA: Diagnosis present

## 2013-04-16 DIAGNOSIS — K219 Gastro-esophageal reflux disease without esophagitis: Secondary | ICD-10-CM | POA: Diagnosis present

## 2013-04-16 DIAGNOSIS — R4182 Altered mental status, unspecified: Secondary | ICD-10-CM | POA: Diagnosis not present

## 2013-04-16 DIAGNOSIS — K5010 Crohn's disease of large intestine without complications: Secondary | ICD-10-CM | POA: Diagnosis present

## 2013-04-16 DIAGNOSIS — E8809 Other disorders of plasma-protein metabolism, not elsewhere classified: Secondary | ICD-10-CM | POA: Diagnosis present

## 2013-04-16 DIAGNOSIS — K432 Incisional hernia without obstruction or gangrene: Secondary | ICD-10-CM | POA: Diagnosis present

## 2013-04-16 DIAGNOSIS — K7689 Other specified diseases of liver: Secondary | ICD-10-CM | POA: Diagnosis present

## 2013-04-16 DIAGNOSIS — R5381 Other malaise: Secondary | ICD-10-CM | POA: Diagnosis present

## 2013-04-16 DIAGNOSIS — I6529 Occlusion and stenosis of unspecified carotid artery: Secondary | ICD-10-CM

## 2013-04-16 DIAGNOSIS — I059 Rheumatic mitral valve disease, unspecified: Secondary | ICD-10-CM | POA: Diagnosis present

## 2013-04-16 DIAGNOSIS — I10 Essential (primary) hypertension: Secondary | ICD-10-CM

## 2013-04-16 DIAGNOSIS — D6832 Hemorrhagic disorder due to extrinsic circulating anticoagulants: Secondary | ICD-10-CM

## 2013-04-16 DIAGNOSIS — I255 Ischemic cardiomyopathy: Secondary | ICD-10-CM | POA: Diagnosis present

## 2013-04-16 DIAGNOSIS — K319 Disease of stomach and duodenum, unspecified: Secondary | ICD-10-CM | POA: Diagnosis not present

## 2013-04-16 DIAGNOSIS — F411 Generalized anxiety disorder: Secondary | ICD-10-CM

## 2013-04-16 DIAGNOSIS — R578 Other shock: Secondary | ICD-10-CM

## 2013-04-16 DIAGNOSIS — R5383 Other fatigue: Secondary | ICD-10-CM

## 2013-04-16 DIAGNOSIS — M1990 Unspecified osteoarthritis, unspecified site: Secondary | ICD-10-CM

## 2013-04-16 DIAGNOSIS — Z79899 Other long term (current) drug therapy: Secondary | ICD-10-CM

## 2013-04-16 DIAGNOSIS — D696 Thrombocytopenia, unspecified: Secondary | ICD-10-CM

## 2013-04-16 DIAGNOSIS — I498 Other specified cardiac arrhythmias: Secondary | ICD-10-CM | POA: Diagnosis present

## 2013-04-16 DIAGNOSIS — R579 Shock, unspecified: Secondary | ICD-10-CM | POA: Diagnosis not present

## 2013-04-16 HISTORY — DX: Thrombocytopenia, unspecified: D69.6

## 2013-04-16 LAB — PROTIME-INR
INR: 6.15 (ref 0.00–1.49)
Prothrombin Time: 52 seconds — ABNORMAL HIGH (ref 11.6–15.2)

## 2013-04-16 LAB — COMPREHENSIVE METABOLIC PANEL
ALBUMIN: 3.3 g/dL — AB (ref 3.5–5.2)
ALT: 14 U/L (ref 0–53)
AST: 24 U/L (ref 0–37)
Alkaline Phosphatase: 49 U/L (ref 39–117)
BILIRUBIN TOTAL: 2.7 mg/dL — AB (ref 0.3–1.2)
BUN: 41 mg/dL — ABNORMAL HIGH (ref 6–23)
CHLORIDE: 107 meq/L (ref 96–112)
CO2: 22 mEq/L (ref 19–32)
Calcium: 9.7 mg/dL (ref 8.4–10.5)
Creatinine, Ser: 1.11 mg/dL (ref 0.50–1.35)
GFR calc Af Amer: 71 mL/min — ABNORMAL LOW (ref >90)
GFR calc non Af Amer: 61 mL/min — ABNORMAL LOW (ref >90)
Glucose, Bld: 236 mg/dL — ABNORMAL HIGH (ref 70–99)
Potassium: 4.6 mEq/L (ref 3.7–5.3)
Sodium: 145 mEq/L (ref 137–147)
Total Protein: 5.6 g/dL — ABNORMAL LOW (ref 6.0–8.3)

## 2013-04-16 LAB — CBC
HCT: 33.7 % — ABNORMAL LOW (ref 39.0–52.0)
HEMATOCRIT: 36.6 % — AB (ref 39.0–52.0)
Hemoglobin: 12.6 g/dL — ABNORMAL LOW (ref 13.0–17.0)
Hemoglobin: 11.4 g/dL — ABNORMAL LOW (ref 13.0–17.0)
MCH: 30.9 pg (ref 26.0–34.0)
MCH: 30.2 pg (ref 26.0–34.0)
MCHC: 34.4 g/dL (ref 30.0–36.0)
MCHC: 33.8 g/dL (ref 30.0–36.0)
MCV: 89.7 fL (ref 78.0–100.0)
MCV: 89.4 fL (ref 78.0–100.0)
Platelets: 148 10*3/uL — ABNORMAL LOW (ref 150–400)
Platelets: 126 10*3/uL — ABNORMAL LOW (ref 150–400)
RBC: 4.08 MIL/uL — AB (ref 4.22–5.81)
RBC: 3.77 MIL/uL — ABNORMAL LOW (ref 4.22–5.81)
RDW: 14.7 % (ref 11.5–15.5)
RDW: 14.6 % (ref 11.5–15.5)
WBC: 14 10*3/uL — AB (ref 4.0–10.5)
WBC: 13.6 10*3/uL — ABNORMAL HIGH (ref 4.0–10.5)

## 2013-04-16 LAB — TYPE AND SCREEN
ABO/RH(D): O POS
Antibody Screen: NEGATIVE

## 2013-04-16 LAB — ABO/RH: ABO/RH(D): O POS

## 2013-04-16 MED ORDER — SODIUM CHLORIDE 0.9 % IV BOLUS (SEPSIS): 1000.0000 mL | Freq: Once | INTRAVENOUS | Status: DC

## 2013-04-16 MED ORDER — PANTOPRAZOLE SODIUM 40 MG IV SOLR
40.0000 mg | Freq: Once | INTRAVENOUS | Status: AC
  Administered 2013-04-16: 40 mg via INTRAVENOUS
  Filled 2013-04-16: qty 40

## 2013-04-16 MED ORDER — PANTOPRAZOLE SODIUM 40 MG IV SOLR: 40.0000 mg | Freq: Two times a day (BID) | INTRAVENOUS | Status: DC

## 2013-04-16 MED ORDER — SODIUM CHLORIDE 0.9 % IV SOLN
Freq: Once | INTRAVENOUS | Status: AC
  Administered 2013-04-16: 20:00:00 via INTRAVENOUS

## 2013-04-16 MED ORDER — SODIUM CHLORIDE 0.9 % IV SOLN
INTRAVENOUS | Status: DC
  Administered 2013-04-17: 10:00:00 via INTRAVENOUS

## 2013-04-16 MED ORDER — SODIUM CHLORIDE 0.9 % IV SOLN
80.0000 mg | Freq: Once | INTRAVENOUS | Status: DC
  Filled 2013-04-16: qty 80

## 2013-04-16 MED ORDER — SODIUM CHLORIDE 0.9 % IV SOLN
8.0000 mg/h | INTRAVENOUS | Status: DC
  Administered 2013-04-17 (×2): 8 mg/h via INTRAVENOUS
  Filled 2013-04-16 (×4): qty 80

## 2013-04-16 MED ORDER — DEXTROSE 5 % IV SOLN
10.0000 mg | Freq: Once | INTRAVENOUS | Status: AC
  Administered 2013-04-16: 10 mg via INTRAVENOUS
  Filled 2013-04-16: qty 1

## 2013-04-16 NOTE — ED Notes (Signed)
Family remains at bedside.  FFP infusing without incident.  Pt voided in urinal.

## 2013-04-16 NOTE — Telephone Encounter (Signed)
Called spoke w/ wife. She reports pt could not get out of bed today. He is not eating or drinking very much. She is home alone and does not feel she could get him to the car herself. i advised her to call for ambulance to help transport him. She will do so. Nothing further needed

## 2013-04-16 NOTE — ED Provider Notes (Signed)
CSN: [id number]     Arrival date & time 04/16/13  1927 History   First MD Initiated Contact with Patient 04/16/13 1944     Chief Complaint  Patient presents with  . Rectal Bleeding  . Fatigue    HPI 80 year old male with a complex past medical history including reported Crohn's disease, on Coumadin secondary to a mesenteric thrombus, and ischemic cardiomyopathy status post ICD pacer placement he presents with GI bleeding. He started feeling bad about 2-3 days ago. Initially he had fatigue and generalized weakness. This was progressive. Today he had several episodes of blood per rectum initially consisting of maroon colored stools, followed later by melena. He has had nausea. His symptoms have been severe. He has been lightheaded and short of breath. His wife reports that he has done nothing available in the bed. No treatments tried. Nothing worsens his symptoms and nothing improves his symptoms.  He thinks he had a minor GI bleed several years ago. He is on Coumadin.  Is not having any chest pain, abdominal pain, back pain.   Past Medical History  Diagnosis Date  . Dizziness     Positional dizziness  . Hypertension   . Ischemic cardiomyopathy     CABG 1994 CAD catherization 1996.[name] occluded vein graft to the diagnol, other grafts patent/  catherization 2006, no PCI/ nuclear.[name] october 2011.[name] extensive scar anteroseptal and apical.. no ischemia    . CHF (congestive heart failure)     chronic systolic  . Mitral regurgitation     mild.[name] echo november 2011  EF 35%...echo.. november 2009/ ef 35%.[name] echo november 2011  . Sinus bradycardia   . Chronotropic incompetence   . Biventricular implantable cardiac defibrillator in situ     GDT CONTAK H170-MADIT-CRT-EXPLANTED 2011 implanted defibrillator- [employer] cognis, model n119-2001 Dr. Caryl Comes (11/28/2009)  . LBBB (left bundle branch block)   . Hyperlipemia   . GERD (gastroesophageal reflux disease)   . Colonic polyp   . Crohn's disease   . Acute  vascular insufficiency of intestine     Mesenteric embolus...surgery 2003  . Ventral hernia   . Hypertrophy of prostate with urinary obstruction and other lower urinary tract symptoms (LUTS)   . Degenerative joint disease   . Polymyalgia rheumatica   . Tremor   . Anxiety   . Shingles   . Vitamin B12 deficiency   . CAD (coronary artery disease)     CABG 1994  /   catheterization 1996, occluded vein graft to the diagonal, other grafts patent  /   catheterization 2006, no PCI  /  nuclear, October, 2011, extensive scar anteroseptal and apical, no ischemia  . Hx of CABG     1994  . Ejection fraction     EF 35%, echo, November, 2011  //   Is 35-40%, echo,  September 23, 2011  . Warfarin anticoagulation     Coumadin therapy for mesenteric embolus 2003  . Carotid artery disease     Doppler, February,  2012, 0-39% bilateral, recommended followup 1 year  . Edema leg     Venous Dopplers 8/13: No DVT bilaterally  . Breast tenderness     Spironolactone was stopped and eplerinone started. Patient feels better   Past Surgical History  Procedure Laterality Date  . Coronary artery bypass graft  1994  . Laparoscopic cholecystectomy  2002  . Elap w/ superior mesenteric artery embolectomy  1/03    by Dr. Jennette Banker  . Repair of incarcerated ventral hernia and lysis of  adhesions  11/03    by Dr. Betsy Pries  . Implantation of biventric cardioverter-defibrillatorr      by Dr. Lovena Le in 8/06 [employer] 2011  . Implantation of guidant cognis device      model [id number]  . Left inguinal hernia repair with mesh  6/08    by Dr. Betsy Pries  . Prostate surgery  08/2012    by urology in [place]   Family History  Problem Relation Age of Onset  . Heart disease Mother   . Hypertension    . Hyperlipidemia    . Tremor Sister   . Heart disease Father 46    Deceased   . Cancer Brother    History  Substance Use Topics  . Smoking status: Former Smoker -- 1.00 packs/day for 20 years    Types:  Cigarettes    Quit date: 01/31/1973  . Smokeless tobacco: Never Used  . Alcohol Use: Yes     Comment: occasional    Review of Systems  Constitutional: Positive for activity change and fatigue. Negative for fever and chills.  HENT: Negative for congestion and rhinorrhea.   Eyes: Negative for visual disturbance.  Respiratory: Positive for shortness of breath. Negative for cough.   Cardiovascular: Negative for chest pain and leg swelling.  Gastrointestinal: Positive for nausea and blood in stool. Negative for vomiting, abdominal pain and diarrhea.  Genitourinary: Negative for dysuria, urgency, frequency, flank pain and difficulty urinating.  Musculoskeletal: Negative for back pain, neck pain and neck stiffness.  Skin: Negative for rash.  Neurological: Positive for weakness and light-headedness. Negative for syncope, numbness and headaches.  All other systems reviewed and are negative.      Allergies  Review of patient's allergies indicates no known allergies.  Home Medications   Current Outpatient Rx  Name  Route  Sig  Dispense  Refill  . aspirin EC 81 MG tablet   Oral   Take 81 mg by mouth daily.         [name] azelastine (ASTELIN) 137 MCG/SPRAY nasal spray   Each Nare   Place 1 spray into both nostrils 2 (two) times daily. Use in each nostril as directed         . carvedilol (COREG) 6.25 MG tablet   Oral   Take 6.25 mg by mouth 2 (two) times daily with a meal.         . cholecalciferol (VITAMIN D) 1000 UNITS tablet   Oral   Take 1,000 Units by mouth daily.          . Cyanocobalamin (B-12) 1000 MCG CAPS   Oral   Take 1,000 mcg by mouth daily.          [name] eplerenone (INSPRA) 25 MG tablet   Oral   Take 25 mg by mouth daily.         . fluticasone (FLONASE) 50 MCG/ACT nasal spray   Each Nare   Place 2 sprays into both nostrils daily.         . furosemide (LASIX) 40 MG tablet   Oral   Take 60 mg by mouth daily.         [name] ibuprofen (ADVIL,MOTRIN) 200  MG tablet   Oral   Take 400 mg by mouth once as needed for mild pain.         [name] lisinopril (PRINIVIL,ZESTRIL) 10 MG tablet   Oral   Take 10 mg by mouth daily.         [name]  simvastatin (ZOCOR) 20 MG tablet   Oral   Take 20 mg by mouth daily.         [name] warfarin (COUMADIN) 5 MG tablet   Oral   Take 5-10 mg by mouth daily. 12m on Sunday, Monday, Wednesday, and Friday. 154mon Tuesday, Thursday, and saturday          BP 95/69  Pulse 84  Temp(Src) 97.7 F (36.5 C) (Oral)  Resp 27  SpO2 98% Physical Exam GENERAL: ill appearing; pale HEENT: atraumatic; normocephalic.  PERRL.  EOMI.  Sclera normal.  Mucus membranes moist.  Oropharynx clear.  pale mucus membranes NECK: supple.  No JVD.  Normal ROM. CARDIOVASCULAR: heart regular of rhythm.  Tachycardic to 150s.  Normal heart sounds with no murmur, gallop, or rub.  Intact, strong, and bilaterally equal distal pulses.  Skin warm and dry.  No peripheral edema. PULMONARY: chest clear to auscultation bilaterally.  No wheezes, rales, or rhonci.  Normal work of breathing. Mild tachypnea ABDOMEN: soft, non-distended, non-tender.  No pulsatile mass. GU: deferred MUSCULOSKELETAL: atraumatic; no edema NEURO: alert and oriented X 4.  CN 2-12 intact.  5/5 strength in bilateral upper extremities and lower extremities, bilaterally symmetric.  Normal sensation to light touch in bilateral upper and lower extremities, symmetric.  Normal tone.  Normal finger to nose and heel to shin test.  Negative Romberg.  Normal gait with no ataxia.  2+ bilateral patellar reflexes, symmetric.  No clonus.  Visual fields intact to confrontation. SKIN: warm and dry with no noted rash, abscess, or cellulitis PSYCH: normal mood and affect; normal memory to recent events   ED Course  Procedures (including critical care time) Labs Review Labs Reviewed  PROTIME-INR - Abnormal; Notable for the following:    Prothrombin Time 52.0 (*)    INR 6.15 (*)    All other  components within normal limits  CBC - Abnormal; Notable for the following:    WBC 14.0 (*)    RBC 4.08 (*)    Hemoglobin 12.6 (*)    HCT 36.6 (*)    Platelets 148 (*)    All other components within normal limits  COMPREHENSIVE METABOLIC PANEL - Abnormal; Notable for the following:    Glucose, Bld 236 (*)    BUN 41 (*)    Total Protein 5.6 (*)    Albumin 3.3 (*)    Total Bilirubin 2.7 (*)    GFR calc non Af Amer 61 (*)    GFR calc Af Amer 71 (*)    All other components within normal limits  CBC - Abnormal; Notable for the following:    WBC 13.6 (*)    RBC 3.77 (*)    Hemoglobin 11.4 (*)    HCT 33.7 (*)    Platelets 126 (*)    All other components within normal limits  POC OCCULT BLOOD, ED  TYPE AND SCREEN  PREPARE FRESH FROZEN PLASMA  ABO/RH   Imaging Review No results found.   EKG Interpretation   Date/Time:  Tuesday April 16 2013 20:09:14 EDT Ventricular Rate:  150 PR Interval:    QRS Duration: 123 QT Interval:  330 QTC Calculation: 521 R Axis:   -71 Text Interpretation:  Wide-QRS tachycardia Ventricular premature complex  Left bundle branch block SINCE LAST TRACING HEART RATE HAS INCREASED  Confirmed by JAWinfred LeedsMD, SAM ([id number] 04/16/2013 8:17:38 PM      MDM   A [age]ear old male with a complex medical history including Crohn's disease,  on Coumadin for mesenteric thrombosis, ischemic cardiomyopathy with an ICD pacer in place here with GI bleed. Maroon colored and melanotic stool. Presyncopal, shortness of breath, fatigue. On arrival to the emergency department, he is tachycardic to the 150s, hypotensive to 80s over 50s. He is ill-appearing he is pale. Abdomen is soft and nontender.  Initial hemoglobin is 13.6, repeat hemoglobin 12.6; repeat 3 hours later 11.4.  INR is 6. Leukocytosis to 13.6.  Type and cross.  He is given 4 units of FFP. He is given 1/2 L of IV fluid and fluid continues to 80s. On reevaluation he still has soft blood pressures in the  90s over 50s range and is still tachycardic in the 150s.  Give IV Protonix  Consulted critical care for admission.  I have paged GI for consult.   Admitted to critical care.  Still awaiting GI call back.  Final diagnoses:  GI bleed  Melena  Hemorrhagic shock  Tachycardia       Wendall Papa, MD 04/17/13 1807

## 2013-04-16 NOTE — ED Notes (Signed)
INR critical @ 6.15 reported. Resident notified.

## 2013-04-16 NOTE — H&P (Signed)
PULMONARY / CRITICAL CARE MEDICINE   Name: [name] MRN: [id number] DOB: Jun 26, 1931    ADMISSION DATE:  04/16/2013 CONSULTATION DATE:  3/17  REFERRING MD :  Winfred Leeds  PRIMARY SERVICE: pccm   CHIEF COMPLAINT:  UGIB  BRIEF PATIENT DESCRIPTION:  80 year old male w/ multiple medical co-morbids: ICM w/ EF 35%, Has biventricular implanted defibrillator/PPM, HTN, HL, Crohn's diease, and chronic coumadin for mesenteric infarct back in 2003,  Presents to the ER 3/17 w/ working dx of UGIB and supra therapeutic INR 6.15   SIGNIFICANT EVENTS / STUDIES:    LINES / TUBES: PIV  CULTURES: none  ANTIBIOTICS: none  HISTORY OF PRESENT ILLNESS:   80 year old male w/ multiple medical co-morbids: ICM w/ EF 35%, Has biventricular implanted defibrillator, HTN, HL, Crohn's diease, and chronic coumadin for mesenteric infarct back in 2003,  Presents to the ER 3/17 w/ CC: 4 d h/o nausea, decreased PO intake, abd discomfort and  progressive weakness. Wife noted dark maroon colored stool w/ what what sounds like clots in the commode on 3/17. Was unable to get him out of bed later that day. EMS called. On arrival to ER INR 6.15, initial hgb 12.1. HR in 140s. PCCM asked to admit.    PAST MEDICAL HISTORY :  Past Medical History  Diagnosis Date  . Dizziness     Positional dizziness  . Hypertension   . Ischemic cardiomyopathy     CABG 1994 CAD catherization 1996.[name] occluded vein graft to the diagnol, other grafts patent/  catherization 2006, no PCI/ nuclear.[name] october 2011.[name] extensive scar anteroseptal and apical.. no ischemia    . CHF (congestive heart failure)     chronic systolic  . Mitral regurgitation     mild.[name] echo november 2011  EF 35%...echo.. november 2009/ ef 35%.[name] echo november 2011  . Sinus bradycardia   . Chronotropic incompetence   . Biventricular implantable cardiac defibrillator in situ     GDT CONTAK H170-MADIT-CRT-EXPLANTED 2011 implanted defibrillator- [employer] cognis, model  n119-2001 Dr. Caryl Comes (11/28/2009)  . LBBB (left bundle branch block)   . Hyperlipemia   . GERD (gastroesophageal reflux disease)   . Colonic polyp   . Crohn's disease   . Acute vascular insufficiency of intestine     Mesenteric embolus...surgery 2003  . Ventral hernia   . Hypertrophy of prostate with urinary obstruction and other lower urinary tract symptoms (LUTS)   . Degenerative joint disease   . Polymyalgia rheumatica   . Tremor   . Anxiety   . Shingles   . Vitamin B12 deficiency   . CAD (coronary artery disease)     CABG 1994  /   catheterization 1996, occluded vein graft to the diagonal, other grafts patent  /   catheterization 2006, no PCI  /  nuclear, October, 2011, extensive scar anteroseptal and apical, no ischemia  . Hx of CABG     1994  . Ejection fraction     EF 35%, echo, November, 2011  //   Is 35-40%, echo,  September 23, 2011  . Warfarin anticoagulation     Coumadin therapy for mesenteric embolus 2003  . Carotid artery disease     Doppler, February,  2012, 0-39% bilateral, recommended followup 1 year  . Edema leg     Venous Dopplers 8/13: No DVT bilaterally  . Breast tenderness     Spironolactone was stopped and eplerinone started. Patient feels better   Past Surgical History  Procedure Laterality Date  .  Coronary artery bypass graft  1994  . Laparoscopic cholecystectomy  2002  . Elap w/ superior mesenteric artery embolectomy  1/03    by Dr. Jennette Banker  . Repair of incarcerated ventral hernia and lysis of adhesions  11/03    by Dr. Betsy Pries  . Implantation of biventric cardioverter-defibrillatorr      by Dr. Lovena Le in 8/06 [employer] 2011  . Implantation of guidant cognis device      model [id number]  . Left inguinal hernia repair with mesh  6/08    by Dr. Betsy Pries  . Prostate surgery  08/2012    by urology in [place]   Prior to Admission medications   Medication Sig Start Date End Date Taking? Authorizing Provider  aspirin EC 81 MG tablet  Take 81 mg by mouth daily.   Yes Historical Provider, MD  azelastine (ASTELIN) 137 MCG/SPRAY nasal spray Place 1 spray into both nostrils 2 (two) times daily. Use in each nostril as directed   Yes Historical Provider, MD  carvedilol (COREG) 6.25 MG tablet Take 6.25 mg by mouth 2 (two) times daily with a meal.   Yes Historical Provider, MD  cholecalciferol (VITAMIN D) 1000 UNITS tablet Take 1,000 Units by mouth daily.    Yes Historical Provider, MD  Cyanocobalamin (B-12) 1000 MCG CAPS Take 1,000 mcg by mouth daily.    Yes Historical Provider, MD  eplerenone (INSPRA) 25 MG tablet Take 25 mg by mouth daily.   Yes Historical Provider, MD  fluticasone (FLONASE) 50 MCG/ACT nasal spray Place 2 sprays into both nostrils daily.   Yes Historical Provider, MD  furosemide (LASIX) 40 MG tablet Take 60 mg by mouth daily.   Yes Historical Provider, MD  ibuprofen (ADVIL,MOTRIN) 200 MG tablet Take 400 mg by mouth once as needed for mild pain.   Yes Historical Provider, MD  lisinopril (PRINIVIL,ZESTRIL) 10 MG tablet Take 10 mg by mouth daily.   Yes Historical Provider, MD  simvastatin (ZOCOR) 20 MG tablet Take 20 mg by mouth daily.   Yes Historical Provider, MD  warfarin (COUMADIN) 5 MG tablet Take 5-10 mg by mouth daily. 30m on Sunday, Monday, Wednesday, and Friday. 10mg on Tuesday, Thursday, and saturday   Yes Historical Provider, MD   No Known Allergies  FAMILY HISTORY:  Family History  Problem Relation Age of Onset  . Heart disease Mother   . Hypertension    . Hyperlipidemia    . Tremor Sister   . Heart disease Father 60    Deceased   . Cancer Brother    SOCIAL HISTORY:  reports that he quit smoking about 40 years ago. His smoking use included Cigarettes. He has a 20 pack-year smoking history. He has never used smokeless tobacco. He reports that he drinks alcohol. He reports that he does not use illicit drugs.  REVIEW OF SYSTEMS:  Unable to obtain d/t fatigue. Most of hx obtained from wife    SUBJECTIVE:   VITAL SIGNS: Temp:  [97.4 F (36.3 C)-97.8 F (36.6 C)] 97.8 F (36.6 C) (03/17 2210) Pulse Rate:  [26-142] 72 (03/17 2235) Resp:  [0-31] 0 (03/17 2235) BP: (75-145)/(51-97) 92/59 mmHg (03/17 2235) SpO2:  [96 %-100 %] 99 % (03/17 2235) HEMODYNAMICS: BP ok   VENTILATOR SETTINGS: [place] oxygen   INTAKE / OUTPUT: Intake/Output     03 /17 0701 - 03/18 0700   I.V. 1050   Blood 208   Total Intake 1258   Net +1258  PHYSICAL EXAMINATION: General:  80 year old male, no acute distress. Pale.  Neuro:  Awake, oriented, no focal def  HEENT:  [place], no JVD  Cardiovascular:  Tachy rrr  Lungs:  Clear  Abdomen:  Non-tender + bowel sounds  Musculoskeletal:  Intact  Skin:  Intact   LABS:  CBC  Recent Labs Lab 04/16/13 1935 04/16/13 2202  WBC 14.0* 13.6*  HGB 12.6* 11.4*  HCT 36.6* 33.7*  PLT 148* 126*   Coag's  Recent Labs Lab 04/16/13 1935  INR 6.15*   BMET  Recent Labs Lab 04/16/13 1935  NA 145  K 4.6  CL 107  CO2 22  BUN 41*  CREATININE 1.11  GLUCOSE 236*   Electrolytes  Recent Labs Lab 04/16/13 1935  CALCIUM 9.7   Sepsis Markers No results found for this basename: LATICACIDVEN, PROCALCITON, O2SATVEN,  in the last 168 hours ABG No results found for this basename: PHART, PCO2ART, PO2ART,  in the last 168 hours Liver Enzymes  Recent Labs Lab 04/16/13 1935  AST 24  ALT 14  ALKPHOS 49  BILITOT 2.7*  ALBUMIN 3.3*   Cardiac Enzymes No results found for this basename: TROPONINI, PROBNP,  in the last 168 hours Glucose No results found for this basename: GLUCAP,  in the last 168 hours  Imaging No results found.   CXR:  pending  ASSESSMENT / PLAN:  PULMONARY A: H/o OSA  P:   Pulse ox Hold CPAP for now given risk of vomiting   CARDIOVASCULAR A:  Transient Hypovolemic/ hemorrhagic shock ICM (EF 35%) Chronic LBBB Wide complex tachycardia  P:  IVF challenge w plasma  Hold antihypertensives Tele monitoring    RENAL A:  No acute  P:   Hold antihypertensives (ace) and also hold NSAIDs Allow volume challenge w/ current ordered blood products Then maintain euvolemia after hemodynamics stabilized   GASTROINTESTINAL A:   Acute UGIB, either due to of complicated by coumadin induced coagulopathy  H/o chron's disease H/o colonic polyps H/o mesenteric ischemia (on coumadin)  P:   NPO Reverse INR Hold coumadin  GI has been called: defer EEG timing to them  PPI gtt Admit to ICU   HEMATOLOGIC A:   Acute blood loss anemia in setting of UGIB Mild thrombocytopenia  Coumadin induced coagulopathy  P:  FFP/Vit K ordered/ will repeat CBC and coags at 0100 3/18 See GI section   INFECTIOUS A:  No evidence of infection  P:   Trend cbc and fever curve   ENDOCRINE A:  Hyperglycemia  P:   ssi (sensitive scale)  NEUROLOGIC A:  No acute P:   Supportive care   TODAY'S SUMMARY:  Likely UGIB. Needs ICU given CM, tachycardia and risk for decompensation. Will correct coagulopathy, GI has been called. Will likely need blood later tonight as expect hgb to drigt after volume repleted.   I have personally obtained a history, examined the patient, evaluated laboratory and imaging results, formulated the assessment and plan and placed orders. CRITICAL CARE: The patient is critically ill with multiple organ systems failure and requires high complexity decision making for assessment and support, frequent evaluation and titration of therapies, application of advanced monitoring technologies and extensive interpretation of multiple databases. Critical Care Time devoted to patient care services described in this note is 40 minutes.   Mariel Sleet Beeper  [phone]  Cell  [phone]  If no response or cell goes to voicemail, call beeper [phone]  Pulmonary and [place] Pager: [phone]  04/16/2013, 10:48 PM

## 2013-04-16 NOTE — ED Notes (Signed)
Wife reports that pt has had progressive weakness x 3 days. Today wife reports patient started passing blood in his stool. Pt reports dizziness and sob. Reports intermittent abdominal pain.

## 2013-04-16 NOTE — ED Provider Notes (Signed)
Patient complains of generalized weakness and "feeling tired" for 3 days. Noted to have melanotic stool. Patient had transient tachydysrhythmia while here. On my exam patient is alert Glasgow Coma Score 15 HEENT exam mucous membranes moist conjunctiva pale neck supple heart regular rate and rhythm apparently 90 beats per minute and nontender  8:30 PM patient noted to be in tachycardic rhythm likely SVT with blood pressure 80/40. Saline 500 mL intravenous bolus ordered.  Orlie Dakin, MD 04/17/13 [phone]

## 2013-04-17 ENCOUNTER — Encounter (HOSPITAL_COMMUNITY): Payer: Self-pay | Admitting: Physician Assistant

## 2013-04-17 ENCOUNTER — Inpatient Hospital Stay (HOSPITAL_COMMUNITY): Payer: Medicare Other

## 2013-04-17 DIAGNOSIS — J438 Other emphysema: Secondary | ICD-10-CM | POA: Diagnosis not present

## 2013-04-17 DIAGNOSIS — G9340 Encephalopathy, unspecified: Secondary | ICD-10-CM

## 2013-04-17 DIAGNOSIS — K922 Gastrointestinal hemorrhage, unspecified: Secondary | ICD-10-CM

## 2013-04-17 DIAGNOSIS — D62 Acute posthemorrhagic anemia: Secondary | ICD-10-CM | POA: Diagnosis not present

## 2013-04-17 DIAGNOSIS — R578 Other shock: Secondary | ICD-10-CM | POA: Diagnosis not present

## 2013-04-17 DIAGNOSIS — K921 Melena: Secondary | ICD-10-CM | POA: Diagnosis not present

## 2013-04-17 DIAGNOSIS — E870 Hyperosmolality and hypernatremia: Secondary | ICD-10-CM | POA: Diagnosis not present

## 2013-04-17 DIAGNOSIS — Z7901 Long term (current) use of anticoagulants: Secondary | ICD-10-CM | POA: Diagnosis not present

## 2013-04-17 LAB — CBC
HCT: 28.1 % — ABNORMAL LOW (ref 39.0–52.0)
HCT: 24.9 % — ABNORMAL LOW (ref 39.0–52.0)
HEMATOCRIT: 24.9 % — AB (ref 39.0–52.0)
HEMOGLOBIN: 8.8 g/dL — AB (ref 13.0–17.0)
Hemoglobin: 9.7 g/dL — ABNORMAL LOW (ref 13.0–17.0)
Hemoglobin: 8.5 g/dL — ABNORMAL LOW (ref 13.0–17.0)
MCH: 30.8 pg (ref 26.0–34.0)
MCH: 31.4 pg (ref 26.0–34.0)
MCH: 31 pg (ref 26.0–34.0)
MCHC: 34.5 g/dL (ref 30.0–36.0)
MCHC: 35.3 g/dL (ref 30.0–36.0)
MCHC: 34.1 g/dL (ref 30.0–36.0)
MCV: 89.2 fL (ref 78.0–100.0)
MCV: 88.9 fL (ref 78.0–100.0)
MCV: 90.9 fL (ref 78.0–100.0)
PLATELETS: 57 10*3/uL — AB (ref 150–400)
Platelets: 81 10*3/uL — ABNORMAL LOW (ref 150–400)
Platelets: 59 10*3/uL — ABNORMAL LOW (ref 150–400)
RBC: 3.15 MIL/uL — ABNORMAL LOW (ref 4.22–5.81)
RBC: 2.8 MIL/uL — ABNORMAL LOW (ref 4.22–5.81)
RBC: 2.74 MIL/uL — ABNORMAL LOW (ref 4.22–5.81)
RDW: 14.7 % (ref 11.5–15.5)
RDW: 14.7 % (ref 11.5–15.5)
RDW: 15 % (ref 11.5–15.5)
WBC: 8.4 10*3/uL (ref 4.0–10.5)
WBC: 4.5 10*3/uL (ref 4.0–10.5)
WBC: 3.9 10*3/uL — ABNORMAL LOW (ref 4.0–10.5)

## 2013-04-17 LAB — PREPARE FRESH FROZEN PLASMA
UNIT DIVISION: 0
UNIT DIVISION: 0
UNIT DIVISION: 0
Unit division: 0
Unit division: 0
Unit division: 0

## 2013-04-17 LAB — BASIC METABOLIC PANEL
BUN: 40 mg/dL — ABNORMAL HIGH (ref 6–23)
CALCIUM: 10 mg/dL (ref 8.4–10.5)
CHLORIDE: 111 meq/L (ref 96–112)
CO2: 24 mEq/L (ref 19–32)
CREATININE: 1.03 mg/dL (ref 0.50–1.35)
GFR calc non Af Amer: 67 mL/min — ABNORMAL LOW (ref >90)
GFR, EST AFRICAN AMERICAN: 78 mL/min — AB (ref >90)
Glucose, Bld: 152 mg/dL — ABNORMAL HIGH (ref 70–99)
Potassium: 4.3 mEq/L (ref 3.7–5.3)
Sodium: 147 mEq/L (ref 137–147)

## 2013-04-17 LAB — MAGNESIUM: Magnesium: 1.9 mg/dL (ref 1.5–2.5)

## 2013-04-17 LAB — TROPONIN I
Troponin I: 0.35 ng/mL (ref <0.30)
Troponin I: 0.56 ng/mL (ref <0.30)
Troponin I: 0.88 ng/mL (ref <0.30)

## 2013-04-17 LAB — LACTIC ACID, PLASMA: Lactic Acid, Venous: 2.4 mmol/L — ABNORMAL HIGH (ref 0.5–2.2)

## 2013-04-17 LAB — AMMONIA: Ammonia: 65 umol/L — ABNORMAL HIGH (ref 11–60)

## 2013-04-17 LAB — PHOSPHORUS: Phosphorus: 1.9 mg/dL — ABNORMAL LOW (ref 2.3–4.6)

## 2013-04-17 LAB — PROTIME-INR
INR: 1.86 — AB (ref 0.00–1.49)
Prothrombin Time: 20.9 seconds — ABNORMAL HIGH (ref 11.6–15.2)

## 2013-04-17 LAB — MRSA PCR SCREENING: MRSA by PCR: NEGATIVE

## 2013-04-17 MED ORDER — SODIUM CHLORIDE 0.9 % IV SOLN
INTRAVENOUS | Status: DC
  Administered 2013-04-18: 1000 mL via INTRAVENOUS

## 2013-04-17 MED ORDER — KCL IN DEXTROSE-NACL 10-5-0.45 MEQ/L-%-% IV SOLN
INTRAVENOUS | Status: DC
  Administered 2013-04-17: 12:00:00 via INTRAVENOUS
  Administered 2013-04-19: 75 mL/h via INTRAVENOUS
  Filled 2013-04-17 (×6): qty 1000

## 2013-04-17 MED ORDER — PANTOPRAZOLE SODIUM 40 MG IV SOLR
40.0000 mg | Freq: Two times a day (BID) | INTRAVENOUS | Status: DC
Start: 2013-04-17 — End: 2013-04-19
  Administered 2013-04-17 – 2013-04-19 (×4): 40 mg via INTRAVENOUS
  Filled 2013-04-17 (×5): qty 40

## 2013-04-17 MED ORDER — CARVEDILOL 6.25 MG PO TABS
6.2500 mg | ORAL_TABLET | Freq: Two times a day (BID) | ORAL | Status: DC
  Administered 2013-04-17: 6.25 mg via ORAL
  Filled 2013-04-17 (×4): qty 1

## 2013-04-17 NOTE — Progress Notes (Signed)
PULMONARY / CRITICAL CARE MEDICINE   Name: [name] MRN: [id number] DOB: Apr 23, 1931    ADMISSION DATE:  04/16/2013 CONSULTATION DATE:  3/17  REFERRING MD :  Winfred Leeds  PRIMARY SERVICE: pccm   CHIEF COMPLAINT:  UGIB  BRIEF PATIENT DESCRIPTION:  80 year old male w/ multiple medical co-morbids: ICM w/ EF 35%, Has biventricular implanted defibrillator/PPM, HTN, HL, Crohn's diease, and chronic coumadin for mesenteric infarct back in 2003,  Presents to the ER 3/17 with 2 episodes of large volume hematochezia, AMS, hypotension (transient) and INR 6.15   SIGNIFICANT EVENTS / STUDIES:    LINES / TUBES:   CULTURES:   ANTIBIOTICS:    SUBJECTIVE:  Lethargic. No distress. Able to F/C but sluggishly  VITAL SIGNS: Temp:  [97.4 F (36.3 C)-98.2 F (36.8 C)] 97.8 F (36.6 C) (03/18 0733) Pulse Rate:  [26-142] 64 (03/18 0700) Resp:  [0-31] 20 (03/18 0700) BP: (75-145)/(42-97) 105/62 mmHg (03/18 0700) SpO2:  [96 %-100 %] 99 % (03/18 0700) Weight:  [94.348 kg (208 lb)] 94.348 kg (208 lb) (03/18 0100)    INTAKE / OUTPUT: Intake/Output     03/17 0701 - 03/18 0700 03/18 0701 - 03/19 0700   I.V. (mL/kg) 1750 (18.5)    Blood 634    Total Intake(mL/kg) 2384 (25.3)    Urine (mL/kg/hr) 400    Total Output 400     Net +1984            PHYSICAL EXAMINATION: General:  NAD, RASS -1, + F/C but requires repeated instruction at times Neuro:  PERRL, EOMI, no motor or sens deficits, DTRs symmetric HEENT:  [place], no JVD  Cardiovascular:  Paced, regular, no M Lungs:  Clear to ausc and perc Abdomen:  Soft, Non-tender, + bowel sounds  Ext: warm without edema   LABS: I have reviewed all of today's lab results. Relevant abnormalities are discussed in the A/P section  CXR: CM, no acute disease  ASSESSMENT / PLAN:  PULMONARY A: H/o OSA  P:   Monitor resp status PRN O2   CARDIOVASCULAR A:  Transient hemorrhagic shock, resolved ICM (EF 35%) Chronic LBBB Wide complex  tachycardia, resolved P:  Telemetry Resume carvedilol   RENAL A:  Hypernatremia P:   Monitor BMET intermittently Monitor I/Os Correct electrolytes as indicated   GASTROINTESTINAL A:   Acute GIB exacerbated by excessive anticoagulation H/o Crohn's disease H/o colonic polyps H/o mesenteric ischemia  P:   Cont NPO GI consultation requested 3/18   HEMATOLOGIC A:   Acute blood loss anemia Thrombocytopenia of unclear etiology Warfarin induced coagulopathy - corrected P:  Monitor CBC frequently Monitor coags as needed  INFECTIOUS A:  No evidence of infection  P:   Micro and abx as above  ENDOCRINE A:  Hyperglycemia without prior hx of DM P:   Monitor glu on chemistry panels  NEUROLOGIC A: Acute encephalopathy of unclear etiology P:   Monitor closely. Consider further eval if doesn't clear by 3/19   TODAY'S SUMMARY:   Transfer to SDU     Merton Border, MD ; [facility] [phone].  After 5:30 PM or weekends, call [phone]

## 2013-04-17 NOTE — Progress Notes (Addendum)
CRITICAL VALUE ALERT  Critical value received: Troponin 0.35  Date of notification:  04/17/13  Time of notification:  8889  Critical value read back: yes  Nurse who received alert:  A. Darcel Smalling RN  MD notified (1st page): Dr Deterding  Time of first page:  [phone]  MD notified (2nd page):  Time of second page:  Responding MD:    Time MD responded:

## 2013-04-17 NOTE — Consult Note (Signed)
Patient seen, examined, and I agree with the above documentation, including the assessment and plan. Agree with EGD tomorrow. Correct coagulopathy with goal INR < 1.5 for procedure tomorrow BID PPI The nature of the procedure, as well as the risks, benefits, and alternatives were carefully and thoroughly reviewed with the patient's wife. Ample time for discussion and questions allowed. The patient's family understood, was satisfied, and agreed to proceed.

## 2013-04-17 NOTE — Consult Note (Signed)
[facility] Consult: 1:57 PM 04/17/2013  LOS: 1 day    Referring Provider: Dr Tarri Fuller of [facility]  Primary Care Physician:  Noralee Space, MD Primary Gastroenterologist:  Dr. Raiford Noble at [facility] in [place]     Reason for Consultation:  GI bleed in setting of iatrogenic coagulopathy   HPI: [name] is a 80 y.o. male.  Multiple medical problems.  S/p CABG. ICM w/ EF 35%, s/p biventricular implanted defibrillator/PPM, HTN, HL, Crohn's diease, and chronic coumadin following mesenteric infarct back in 2003.  Several crohn's related stricture surgeries, per colonoscopy these were at [facility], in [place] with wife reporting "28 inches" of SB resection as well as ileostomy and later reversal.   Last surgery was ~ 5 years ago.  Had surgery on one occasion after pill cam got stuck at a stricture in hi SB. MD stopped Mercaptopurine about 18 to 24 months ago and he has stable formed , non-bloody BMs 1 to 2 times a day.  Often has fecal urgency in AM.  No anorexia, no abdominal pain.  Last week for less than 24 hours he was queasy but did not vomit. No PPI PTA.   Yesterday wife noticed 2 separate tarry stools, leached red blood into commode.  He was weak and not himself, somewhat confused and having speech difficulty. Came to ED and INR was 6.1.  Reversed with FFP x 4. Hgb 11.4 now 8.8. No PRBCs so far  Melena has ceased.  He has not vomitted, has no abdominal pain but family concerned as he is still confused.  No brain imaging and no limb weakness. Normally he is conversant, ambulatory and has no significant activity limits per wife.  Last colonoscopy about 2 to 3 years ago. CC of colon from 2010 in [place], showed normal colono except for polyps (path unknown), normal terminal ileum.   Has several ventral incisional  hernias which do not cause problems     Past Medical History  Diagnosis Date  . Dizziness     Positional dizziness  . Hypertension   . Ischemic cardiomyopathy     CABG 1994 CAD catherization 1996.[name] occluded vein graft to the diagnol, other grafts patent/  catherization 2006, no PCI/ nuclear.[name] october 2011.[name] extensive scar anteroseptal and apical.. no ischemia    . CHF (congestive heart failure)     chronic systolic  . Mitral regurgitation     mild.[name] echo november 2011  EF 35%...echo.. november 2009/ ef 35%.[name] echo november 2011  . Sinus bradycardia   . Chronotropic incompetence   . Biventricular implantable cardiac defibrillator in situ     GDT CONTAK H170-MADIT-CRT-EXPLANTED 2011 implanted defibrillator- [employer] cognis, model n119-2001 Dr. Caryl Comes (11/28/2009)  . LBBB (left bundle branch block)   . Hyperlipemia   . GERD (gastroesophageal reflux disease)   . Colonic polyp   . Crohn's disease   . Acute vascular insufficiency of intestine     Mesenteric embolus...surgery 2003  . Ventral hernia   . Hypertrophy of prostate with urinary obstruction and other lower urinary tract symptoms (LUTS)   .  Degenerative joint disease   . Polymyalgia rheumatica   . Tremor   . Anxiety   . Shingles   . Vitamin B12 deficiency   . CAD (coronary artery disease)     CABG 1994  /   catheterization 1996, occluded vein graft to the diagonal, other grafts patent  /   catheterization 2006, no PCI  /  nuclear, October, 2011, extensive scar anteroseptal and apical, no ischemia  . Hx of CABG     1994  . Ejection fraction     EF 35%, echo, November, 2011  //   Is 35-40%, echo,  September 23, 2011  . Warfarin anticoagulation     Coumadin therapy for mesenteric embolus 2003  . Carotid artery disease     Doppler, February,  2012, 0-39% bilateral, recommended followup 1 year  . Edema leg     Venous Dopplers 8/13: No DVT bilaterally  . Breast tenderness     Spironolactone was stopped and eplerinone started.  Patient feels better    Past Surgical History  Procedure Laterality Date  . Coronary artery bypass graft  1994  . Laparoscopic cholecystectomy  2002  . Elap w/ superior mesenteric artery embolectomy  1/03    by Dr. Jennette Banker  . Repair of incarcerated ventral hernia and lysis of adhesions  11/03    by Dr. Betsy Pries  . Implantation of biventric cardioverter-defibrillatorr      by Dr. Lovena Le in 8/06 [employer] [place] 2011  . Implantation of guidant cognis device      model [id number]  . Left inguinal hernia repair with mesh  6/08    by Dr. Betsy Pries  . Prostate surgery  08/2012    by urology in [place]    Prior to Admission medications   Medication Sig Start Date End Date Taking? Authorizing Provider  aspirin EC 81 MG tablet Take 81 mg by mouth daily.   Yes Historical Provider, MD  azelastine (ASTELIN) 137 MCG/SPRAY nasal spray Place 1 spray into both nostrils 2 (two) times daily. Use in each nostril as directed   Yes Historical Provider, MD  carvedilol (COREG) 6.25 MG tablet Take 6.25 mg by mouth 2 (two) times daily with a meal.   Yes Historical Provider, MD  cholecalciferol (VITAMIN D) 1000 UNITS tablet Take 1,000 Units by mouth daily.    Yes Historical Provider, MD  Cyanocobalamin (B-12) 1000 MCG CAPS Take 1,000 mcg by mouth daily.    Yes Historical Provider, MD  eplerenone (INSPRA) 25 MG tablet Take 25 mg by mouth daily.   Yes Historical Provider, MD  fluticasone (FLONASE) 50 MCG/ACT nasal spray Place 2 sprays into both nostrils daily.   Yes Historical Provider, MD  furosemide (LASIX) 40 MG tablet Take 60 mg by mouth daily.   Yes Historical Provider, MD  ibuprofen (ADVIL,MOTRIN) 200 MG tablet Take 400 mg by mouth once as needed for mild pain. Rarely uses,  Maybe once per month  Yes Historical Provider, MD  lisinopril (PRINIVIL,ZESTRIL) 10 MG tablet Take 10 mg by mouth daily.   Yes Historical Provider, MD  simvastatin (ZOCOR) 20 MG tablet Take 20 mg by mouth daily.   Yes  Historical Provider, MD  warfarin (COUMADIN) 5 MG tablet Take 5-10 mg by mouth daily. 69m on Sunday, Monday, Wednesday, and Friday. 137mon Tuesday, Thursday, and saturday   Yes Historical Provider, MD    Scheduled Meds: . carvedilol  6.25 mg Oral BID WC  . pantoprazole (PROTONIX) IV  40 mg Intravenous Q12H  Infusions: . dextrose 5 % and 0.45 % NaCl with KCl 10 mEq/L 75 mL/hr at 04/17/13 1130   PRN Meds:    Allergies as of 04/16/2013  . (No Known Allergies)    Family History  Problem Relation Age of Onset  . Heart disease Mother   . Hypertension    . Hyperlipidemia    . Tremor Sister   . Heart disease Father 60    Deceased   . Cancer Brother     History   Social History  . Marital Status: Married    Spouse Name: Kennyth Lose    Number of Children: 3  . 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 Smoking status: Former Smoker -- 1.00 packs/day for 20 years    Types: Cigarettes    Quit date: 01/31/1973  . Smokeless tobacco: Never Used  . Alcohol Use: Yes     Comment: occasional  . Drug Use: No  . Sexual Activity: Not on file   Other Topics Concern  . Not on file   Social History Narrative   Lives in [place], [place] with wife.     REVIEW OF SYSTEMS: Constitutional:  generrally no fatigue or weakness ENT:  No nose bleeds Pulm:  No cough or SOB CV:  No palpitations, no LE edema.  GU:  No hematuria, no frequency GI:  Per HPI.  No hx GERD or PUD .  No dysphagia Heme:  On chronic folic acid   Transfusions:  None in past Neuro:  No headaches, no peripheral tingling or numbness Derm:  No itching, no rash or sores.  Endocrine:  No sweats or chills.  No polyuria or dysuria Immunization:  Flu shot up to date.  Travel:  None beyond local counties in last few months.    PHYSICAL EXAM: Vital signs in last 24 hours: Filed Vitals:   04/17/13 1300  BP: 115/63  Pulse: 62  Temp:     Resp: 14   Wt Readings from Last 3 Encounters:  04/17/13 94.348 kg (208 lb)  03/11/13 94.892 kg (209 lb 3.2 oz)  11/19/12 92.715 kg (204 lb 6.4 oz)    General: elderly WM sho looks unwell.  Weak and difficult to understand Head:  No asymmetry, trauma or swelling  Eyes:  No icterus, + conjunctival pallor Ears:  ? HOH  Nose:  No discharge Mouth:  Somewhat dry, no sores, no teeth.  Neck:  No mass or TMG, no JVD Lungs:  Clear bil.  No dyspnea or cough Heart: RRR.  No MRG Abdomen:  Soft, NT, ND.  No mass or HSM.  Several incisional ventral hernias Bilaterally.   Rectal: black, FOBT+, tarry stool   Musc/Skeltl: no joint swelling or contracture Extremities:  No CCE.  Feet warm  Neurologic:  Confused, follows commands, some slow limb moements.  No limb weakness.  Tongue midline Skin:  No telangectasia Tattoos:  none Nodes:  No cervical adenopathy   Psych:  Slightly agitated, at other times somnolent.   Intake/Output from previous day: 03/17 0701 - 03/18 0700 In: 2384 [I.V.:1750; Blood:634] Out: 400 [Urine:400] Intake/Output this shift: Total I/O In: 525 [I.V.:525] Out: -   LAB RESULTS:  Recent Labs  04/16/13 2202 04/17/13 0114 04/17/13 0709  WBC 13.6* 8.4 4.5  HGB 11.4* 9.7* 8.8*  HCT 33.7* 28.1* 24.9*  PLT 126* 81* 57*   BMET Lab  Results  Component Value Date   NA 147 04/17/2013   NA 145 04/16/2013   NA 140 03/11/2013   K 4.3 04/17/2013   K 4.6 04/16/2013   K 4.2 03/11/2013   CL 111 04/17/2013   CL 107 04/16/2013   CL 104 03/11/2013   CO2 24 04/17/2013   CO2 22 04/16/2013   CO2 29 03/11/2013   GLUCOSE 152* 04/17/2013   GLUCOSE 236* 04/16/2013   GLUCOSE 90 03/11/2013   BUN 40* 04/17/2013   BUN 41* 04/16/2013   BUN 11 03/11/2013   CREATININE 1.03 04/17/2013   CREATININE 1.11 04/16/2013   CREATININE 1.0 03/11/2013   CALCIUM 10.0 04/17/2013   CALCIUM 9.7 04/16/2013   CALCIUM 9.5 03/11/2013   LFT  Recent Labs  04/16/13 1935  PROT 5.6*  ALBUMIN 3.3*  AST 24  ALT 14  ALKPHOS  49  BILITOT 2.7*   PT/INR Lab Results  Component Value Date   INR 1.86* 04/17/2013   INR 6.15* 04/16/2013   INR 2.2 03/20/2013   PROTIME 17.1 07/01/2008   Hepatitis Panel No results found for this basename: HEPBSAG, HCVAB, HEPAIGM, HEPBIGM,  in the last 72 hours C-Diff No components found with this basename: cdiff   Lipase     Component Value Date/Time   LIPASE 36 04/15/2011 0338    Drugs of Abuse  No results found for this basename: labopia, cocainscrnur, labbenz, amphetmu, thcu, labbarb     RADIOLOGY STUDIES: Dg Chest Port 1 View  04/17/2013   CLINICAL DATA:  Gastrointestinal bleeding  EXAM: PORTABLE CHEST - 1 VIEW  COMPARISON:  March 31, 2009  FINDINGS: There is underlying emphysematous change. There is no appreciable edema or consolidation. Heart is enlarged with pulmonary vascularity within normal limits. Pacemaker leads are attached to the right atrium and right ventricle. Patient is status post internal mammary bypass grafting. No pneumothorax. No adenopathy. There is an azygos lobe on the right, an anatomic variant.  IMPRESSION: Underlying emphysema.  Cardiomegaly.  No edema or consolidation.   Electronically Signed   By: Lowella Grip M.D.   On: 04/17/2013 08:08    ENDOSCOPIC STUDIES: Per HPI  IMPRESSION:   *  Upper GI bleed.  Slowed and or stopped following FFP x 4, initiation of Protonix IV BID, vitamin K  *  Coagulopathy.  coags not yet rechecked Chronic Coumadin in use since had thromboembolic mesenteric infarct 2003 at which time he had embolectomy.   *  ABL anemia.  *  Ischemic CM.  S/p icd/bivalve pacemaker.   *  Long hx Crohn's disease, of SB by history.  Several stricture related surgeries. S/p lap LOA 2003 with ventral hernia repair with mesh.   *  AMS.  Seems like delerium. No gross deficits in limbs.   *  Hyperglycemia.     PLAN:     *  Set up for EGD tomorrow.  *  coags in AM   Azucena Freed  04/17/2013, 1:57 PM Pager: [phone]

## 2013-04-18 ENCOUNTER — Inpatient Hospital Stay (HOSPITAL_COMMUNITY): Payer: Medicare Other

## 2013-04-18 ENCOUNTER — Encounter (HOSPITAL_COMMUNITY): Payer: Self-pay

## 2013-04-18 ENCOUNTER — Encounter (HOSPITAL_COMMUNITY)
Admission: EM | Disposition: A | Discharge status: Home / Self Care | Payer: Self-pay | Source: Home / Self Care | Attending: Pulmonary Disease

## 2013-04-18 DIAGNOSIS — K319 Disease of stomach and duodenum, unspecified: Secondary | ICD-10-CM | POA: Diagnosis not present

## 2013-04-18 DIAGNOSIS — K922 Gastrointestinal hemorrhage, unspecified: Secondary | ICD-10-CM | POA: Diagnosis not present

## 2013-04-18 DIAGNOSIS — G9340 Encephalopathy, unspecified: Secondary | ICD-10-CM | POA: Diagnosis not present

## 2013-04-18 DIAGNOSIS — D62 Acute posthemorrhagic anemia: Secondary | ICD-10-CM | POA: Diagnosis not present

## 2013-04-18 DIAGNOSIS — R4182 Altered mental status, unspecified: Secondary | ICD-10-CM | POA: Diagnosis not present

## 2013-04-18 DIAGNOSIS — K5090 Crohn's disease, unspecified, without complications: Secondary | ICD-10-CM | POA: Diagnosis not present

## 2013-04-18 HISTORY — PX: ESOPHAGOGASTRODUODENOSCOPY: SHX5428

## 2013-04-18 LAB — PROTIME-INR
INR: 1.42 (ref 0.00–1.49)
PROTHROMBIN TIME: 17 s — AB (ref 11.6–15.2)

## 2013-04-18 LAB — BASIC METABOLIC PANEL
BUN: 21 mg/dL (ref 6–23)
CO2: 26 mEq/L (ref 19–32)
CREATININE: 0.99 mg/dL (ref 0.50–1.35)
Calcium: 9.1 mg/dL (ref 8.4–10.5)
Chloride: 113 mEq/L — ABNORMAL HIGH (ref 96–112)
GFR, EST AFRICAN AMERICAN: 88 mL/min — AB (ref >90)
GFR, EST NON AFRICAN AMERICAN: 76 mL/min — AB (ref >90)
Glucose, Bld: 119 mg/dL — ABNORMAL HIGH (ref 70–99)
Potassium: 3.9 mEq/L (ref 3.7–5.3)
Sodium: 146 mEq/L (ref 137–147)

## 2013-04-18 LAB — CBC
HCT: 25.1 % — ABNORMAL LOW (ref 39.0–52.0)
Hemoglobin: 8.7 g/dL — ABNORMAL LOW (ref 13.0–17.0)
MCH: 31.9 pg (ref 26.0–34.0)
MCHC: 34.7 g/dL (ref 30.0–36.0)
MCV: 91.9 fL (ref 78.0–100.0)
Platelets: 57 10*3/uL — ABNORMAL LOW (ref 150–400)
RBC: 2.73 MIL/uL — ABNORMAL LOW (ref 4.22–5.81)
RDW: 15 % (ref 11.5–15.5)
WBC: 3.3 10*3/uL — ABNORMAL LOW (ref 4.0–10.5)

## 2013-04-18 SURGERY — EGD (ESOPHAGOGASTRODUODENOSCOPY)
Anesthesia: Moderate Sedation

## 2013-04-18 MED ORDER — MIDAZOLAM HCL 10 MG/2ML IJ SOLN
INTRAMUSCULAR | Status: DC | PRN
  Administered 2013-04-18: 2 mg via INTRAVENOUS

## 2013-04-18 MED ORDER — PEG-KCL-NACL-NASULF-NA ASC-C 100 G PO SOLR
0.5000 | Freq: Once | ORAL | Status: AC
  Administered 2013-04-19: 100 g via ORAL
  Filled 2013-04-18: qty 1

## 2013-04-18 MED ORDER — SIMVASTATIN 20 MG PO TABS
20.0000 mg | ORAL_TABLET | Freq: Every day | ORAL | Status: DC
  Administered 2013-04-19 – 2013-04-21 (×3): 20 mg via ORAL
  Filled 2013-04-18 (×5): qty 1

## 2013-04-18 MED ORDER — BUTAMBEN-TETRACAINE-BENZOCAINE 2-2-14 % EX AERO
INHALATION_SPRAY | CUTANEOUS | Status: DC | PRN
  Administered 2013-04-18: 2 via TOPICAL

## 2013-04-18 MED ORDER — CARVEDILOL 3.125 MG PO TABS
3.1250 mg | ORAL_TABLET | Freq: Two times a day (BID) | ORAL | Status: DC
  Administered 2013-04-19: 3.125 mg via ORAL
  Administered 2013-04-19: 3.12 mg via ORAL
  Administered 2013-04-20 – 2013-04-22 (×5): 3.125 mg via ORAL
  Filled 2013-04-18 (×11): qty 1

## 2013-04-18 MED ORDER — MIDAZOLAM HCL 5 MG/ML IJ SOLN
INTRAMUSCULAR | Status: AC
  Filled 2013-04-18: qty 2

## 2013-04-18 MED ORDER — FENTANYL CITRATE 0.05 MG/ML IJ SOLN
INTRAMUSCULAR | Status: AC
  Filled 2013-04-18: qty 2

## 2013-04-18 MED ORDER — SODIUM CHLORIDE 0.9 % IV SOLN: INTRAVENOUS | Status: DC

## 2013-04-18 MED ORDER — FENTANYL CITRATE 0.05 MG/ML IJ SOLN
INTRAMUSCULAR | Status: DC | PRN
  Administered 2013-04-18: 12.5 ug via INTRAVENOUS

## 2013-04-18 MED ORDER — PEG-KCL-NACL-NASULF-NA ASC-C 100 G PO SOLR
0.5000 | Freq: Once | ORAL | Status: AC
  Administered 2013-04-18: 100 g via ORAL
  Filled 2013-04-18: qty 1

## 2013-04-18 MED ORDER — PEG-KCL-NACL-NASULF-NA ASC-C 100 G PO SOLR: 1.0000 | Freq: Once | ORAL | Status: DC

## 2013-04-18 MED ORDER — DIPHENHYDRAMINE HCL 50 MG/ML IJ SOLN
INTRAMUSCULAR | Status: AC
  Filled 2013-04-18: qty 1

## 2013-04-18 NOTE — Progress Notes (Signed)
PULMONARY / CRITICAL CARE MEDICINE   Name: [name] MRN: [id number] DOB: 09-22-1931    ADMISSION DATE:  04/16/2013 CONSULTATION DATE:  3/17  REFERRING MD :  Winfred Leeds  PRIMARY SERVICE: [facility]   CHIEF COMPLAINT:  UGIB  BRIEF PATIENT DESCRIPTION:  80 year old male w/ multiple medical co-morbids: ICM w/ EF 35%, Has biventricular implanted defibrillator/PPM, HTN, HL, Crohn's diease, and chronic coumadin for mesenteric infarct back in 2003,  Presents to the ER 3/17 with 2 episodes of large volume hematochezia, AMS, hypotension (transient) and INR 6.15   SIGNIFICANT EVENTS / STUDIES:  3/19 CT head (r/o CVA): NAD 3/19 EGD:   LINES / TUBES:   CULTURES:   ANTIBIOTICS:    SUBJECTIVE:  Less lethargic. Still slow to respond to questions. No distress.  VITAL SIGNS: Temp:  [97.5 F (36.4 C)-98.2 F (36.8 C)] 98 F (36.7 C) (03/19 1451) Pulse Rate:  [45-69] 67 (03/19 1451) Resp:  [15-32] 32 (03/19 1451) BP: (88-136)/(46-72) 136/61 mmHg (03/19 1451) SpO2:  [95 %-100 %] 98 % (03/19 1451) Weight:  [94.348 kg (208 lb)] 94.348 kg (208 lb) (03/19 1451)    INTAKE / OUTPUT: Intake/Output     03/18 0701 - 03/19 0700 03/19 0701 - 03/20 0700   P.O. 100    I.V. (mL/kg) 1875 (19.9) 600 (6.4)   Blood     Total Intake(mL/kg) 1975 (20.9) 600 (6.4)   Urine (mL/kg/hr) 476 (0.2) 850 (0.9)   Stool 1 (0)    Total Output 477 850   Net +1498 -250        Urine Occurrence  3 x   Stool Occurrence  2 x     PHYSICAL EXAMINATION: General:  NAD, RASS 0, + F/C slowly Neuro:  Slight R facial droop, PERRL, EOMI, no motor or sens deficits, DTRs symmetric HEENT:  [place], no JVD  Cardiovascular:  Paced, regular, no M Lungs:  Clear to ausc and perc Abdomen:  Soft, Non-tender, + bowel sounds  Ext: warm without edema   LABS: I have reviewed all of today's lab results. Relevant abnormalities are discussed in the A/P section  CXR: NNF  ASSESSMENT / PLAN:  PULMONARY A: H/o OSA  P:   Monitor  resp status PRN O2   CARDIOVASCULAR A:  Transient hemorrhagic shock, resolved ICM (EF 35%) Chronic LBBB Wide complex tachycardia, resolved Transient bradycardia P:  Telemetry Reduce carvedilol   RENAL A:  Hypernatremia P:   Monitor BMET intermittently Monitor I/Os Correct electrolytes as indicated Cont maintenance IVFs  GASTROINTESTINAL A:   Acute GIB exacerbated by excessive anticoagulation, resolved H/o Crohn's disease H/o colonic polyps H/o mesenteric ischemia  P:   Cont NPO EGD planned 3/19  HEMATOLOGIC A:   Acute blood loss anemia Thrombocytopenia of unclear etiology Warfarin induced coagulopathy - corrected P:  Monitor CBC frequently Monitor coags as needed  INFECTIOUS A:  No evidence of infection  P:   Micro and abx as above  ENDOCRINE A:  Hyperglycemia without prior hx of DM P:   Monitor glu on chemistry panels  NEUROLOGIC A: Acute encephalopathy of unclear etiology P:   CT head 3/19 Minimize sedatives Monitor   TODAY'S SUMMARY:   Transfer to SDU. If no active bleeding on EGD, consider transfer to [place], [place] ; [facility] [phone].  After 5:30 PM or weekends, call [phone]

## 2013-04-18 NOTE — Progress Notes (Signed)
Pt came to floor from ICU and is alert and oriented. Pt has family with him. Pt is slightly drowsy still from having a recent EGD. Pt resting in no distress. Vitals within normal limits. Pt received CHG bath. Central telemetry notified. Giving report to night nurse.

## 2013-04-18 NOTE — Progress Notes (Signed)
Utilization review completed. Itzae Miralles, RN, BSN. 

## 2013-04-18 NOTE — Progress Notes (Signed)
Patient is transferred [place], with monitor and belongings.  Bedside RN given report.  CCM service made aware that patient was moved.

## 2013-04-18 NOTE — ED Provider Notes (Signed)
Patient remained hypotensive and tachycardic after transfusion with intravenous normal saline bolus, and 4 units of fresh frozen plasma. He remains alert and talkative, Glasgow Coma Score 15 Medical decision making he is coagulopathic due to Coumadin toxicity. Experiencing upper GI bleed. Critical care team consult for admission to intensive care unit. Gastroenterology consult. He is at risk for decompensation. CRITICAL CARE Performed by: Orlie Dakin Total critical care time: 35 minute Critical care time was exclusive of separately billable procedures and treating other patients. Critical care was necessary to treat or prevent imminent or life-threatening deterioration. Critical care was time spent personally by me on the following activities: development of treatment plan with patient and/or surrogate as well as nursing, discussions with consultants, evaluation of patient's response to treatment, examination of patient, obtaining history from patient or surrogate, ordering and performing treatments and interventions, ordering and review of laboratory studies, ordering and review of radiographic studies, pulse oximetry and re-evaluation of patient's condition.   I have personally seen and examined the patient.  I have discussed the plan of care with the resident.  I have reviewed the documentation on PMH/FH/Soc. History.  I have reviewed the documentation of the resident and agree.  Orlie Dakin, MD 04/18/13 [phone]

## 2013-04-18 NOTE — Progress Notes (Signed)
Per Night RN and wife at bedside stated that patient been confused and not himself.  Upon assessing patient:  Patient was oriented to place and situation, he was able to state that he was at [place] and he came in for bleeding rectally. Patient was able to follow all commands with all extremities, patient was able to verbalize normally, no slurred speech, no facial droop, patient was able to track visually across the room.  Per patient's wife, he is not neurological at his baseline.    Will monitor

## 2013-04-18 NOTE — Op Note (Signed)
[facility] [place], [id number]   ENDOSCOPY PROCEDURE REPORT  PATIENT: [name], [name]  MR#: [id number] BIRTHDATE: Oct 22, 1931 , 81  yrs. old GENDER: Male ENDOSCOPIST: Jerene Bears, MD REFERRED BY:  Elsie Stain, M.D. PROCEDURE DATE:  04/18/2013 PROCEDURE:  EGD w/ biopsy ASA CLASS:     Class III INDICATIONS:  Acute post hemorrhagic anemia.   Melena. MEDICATIONS: These medications were titrated to patient response per physician's verbal order, Propofol (Diprivan), Fentanyl-Quick Pick, Fentanyl 12.5 mcg IV, and Versed 2 mg IV TOPICAL ANESTHETIC: Cetacaine Spray  DESCRIPTION OF PROCEDURE: After the risks benefits and alternatives of the procedure were thoroughly explained, informed consent was obtained.  The Pentax Gastroscope [id number] endoscope was introduced through the mouth and advanced to the second portion of the duodenum. Without limitations.  The instrument was slowly withdrawn as the mucosa was fully examined.  ESOPHAGUS: The mucosa of the esophagus appeared normal.   Z-line unremarkable.  STOMACH: Fairly discrete area of nodular mucosa was found in at the prepyloric stomach and extending into pyloric channel.   Mild contact oozing in the pyloric channel but no ulceration or erosions were seen.  A biopsy was performed using cold forceps.  Sample sent for histology.   The stomach otherwise appeared normal.  DUODENUM: The duodenal mucosa showed no abnormalities in the bulb and second portion of the duodenum.  Retroflexed views revealed no abnormalities.     The scope was then withdrawn from the patient and the procedure completed.  COMPLICATIONS: There were no complications.  ENDOSCOPIC IMPRESSION: 1.   The mucosa of the esophagus appeared normal 2.   Abnormal mucosa was found in the prepyloric region of the stomach; The mucosa was nodular; biopsy 3.   The stomach otherwise appeared normal 4.   The duodenal mucosa showed  no abnormalities in the bulb and second portion of the duodenum  RECOMMENDATIONS: 1.  Await biopsy results 2.  Bleeding source not identified today on EGD, though bleed likely instigated by supratherapeutic INR due to warfarin.  Next diagnostic step would be colonoscopy  eSigned:  Jerene Bears, MD 04/18/2013 5:29 PM   CC:The Patient

## 2013-04-19 ENCOUNTER — Encounter (HOSPITAL_COMMUNITY): Payer: Self-pay | Admitting: Internal Medicine

## 2013-04-19 ENCOUNTER — Encounter (HOSPITAL_COMMUNITY)
Admission: EM | Disposition: A | Discharge status: Home / Self Care | Payer: Self-pay | Source: Home / Self Care | Attending: Pulmonary Disease

## 2013-04-19 DIAGNOSIS — D62 Acute posthemorrhagic anemia: Secondary | ICD-10-CM | POA: Diagnosis not present

## 2013-04-19 DIAGNOSIS — G9340 Encephalopathy, unspecified: Secondary | ICD-10-CM | POA: Diagnosis not present

## 2013-04-19 DIAGNOSIS — I5022 Chronic systolic (congestive) heart failure: Secondary | ICD-10-CM

## 2013-04-19 DIAGNOSIS — K922 Gastrointestinal hemorrhage, unspecified: Secondary | ICD-10-CM | POA: Diagnosis not present

## 2013-04-19 HISTORY — PX: COLONOSCOPY: SHX5424

## 2013-04-19 LAB — CBC
HCT: 24.9 % — ABNORMAL LOW (ref 39.0–52.0)
Hemoglobin: 8.6 g/dL — ABNORMAL LOW (ref 13.0–17.0)
MCH: 31.7 pg (ref 26.0–34.0)
MCHC: 34.5 g/dL (ref 30.0–36.0)
MCV: 91.9 fL (ref 78.0–100.0)
PLATELETS: 61 10*3/uL — AB (ref 150–400)
RBC: 2.71 MIL/uL — AB (ref 4.22–5.81)
RDW: 14.9 % (ref 11.5–15.5)
WBC: 3 10*3/uL — AB (ref 4.0–10.5)

## 2013-04-19 LAB — COMPREHENSIVE METABOLIC PANEL
ALK PHOS: 52 U/L (ref 39–117)
ALT: 26 U/L (ref 0–53)
AST: 44 U/L — AB (ref 0–37)
Albumin: 2.9 g/dL — ABNORMAL LOW (ref 3.5–5.2)
BILIRUBIN TOTAL: 3.2 mg/dL — AB (ref 0.3–1.2)
BUN: 14 mg/dL (ref 6–23)
CHLORIDE: 115 meq/L — AB (ref 96–112)
CO2: 22 meq/L (ref 19–32)
Calcium: 8.7 mg/dL (ref 8.4–10.5)
Creatinine, Ser: 0.99 mg/dL (ref 0.50–1.35)
GFR, EST AFRICAN AMERICAN: 88 mL/min — AB (ref >90)
GFR, EST NON AFRICAN AMERICAN: 76 mL/min — AB (ref >90)
GLUCOSE: 125 mg/dL — AB (ref 70–99)
POTASSIUM: 3.7 meq/L (ref 3.7–5.3)
SODIUM: 148 meq/L — AB (ref 137–147)
Total Protein: 4.9 g/dL — ABNORMAL LOW (ref 6.0–8.3)

## 2013-04-19 SURGERY — COLONOSCOPY
Anesthesia: Moderate Sedation

## 2013-04-19 MED ORDER — FENTANYL CITRATE 0.05 MG/ML IJ SOLN
INTRAMUSCULAR | Status: DC | PRN
  Administered 2013-04-19: 25 ug via INTRAVENOUS

## 2013-04-19 MED ORDER — DEXTROSE 5 % IV SOLN: INTRAVENOUS | Status: AC

## 2013-04-19 MED ORDER — MIDAZOLAM HCL 5 MG/5ML IJ SOLN
INTRAMUSCULAR | Status: DC | PRN
  Administered 2013-04-19: 2 mg via INTRAVENOUS
  Administered 2013-04-19: 1 mg via INTRAVENOUS

## 2013-04-19 MED ORDER — PANTOPRAZOLE SODIUM 40 MG PO TBEC
40.0000 mg | DELAYED_RELEASE_TABLET | Freq: Every day | ORAL | Status: DC
  Administered 2013-04-20 – 2013-04-22 (×3): 40 mg via ORAL
  Filled 2013-04-19 (×3): qty 1

## 2013-04-19 MED ORDER — FENTANYL CITRATE 0.05 MG/ML IJ SOLN
INTRAMUSCULAR | Status: AC
  Filled 2013-04-19: qty 2

## 2013-04-19 MED ORDER — MIDAZOLAM HCL 5 MG/ML IJ SOLN
INTRAMUSCULAR | Status: AC
  Filled 2013-04-19: qty 2

## 2013-04-19 NOTE — Progress Notes (Signed)
Patient seen, examined, and I agree with the above documentation, including the assessment and plan.

## 2013-04-19 NOTE — Progress Notes (Signed)
No source for recent GI bleeding after colonoscopy today All occurred in setting of supratherapeutic INR Would not proceed to capsule at this time given hx of small bowel Crohn's and multiple resections and hx of retained capsule requiring surgery Would recommend tagged RBC study for any evidence of rebleeding Attempt to maintain INR in therapeutic range Call with questions

## 2013-04-19 NOTE — Progress Notes (Signed)
INITIAL NUTRITION ASSESSMENT  DOCUMENTATION CODES Per approved criteria  -Not Applicable   INTERVENTION: - Advance diet as tolerated per MD.  - RD will monitor PO intake and add supplements as needed.   NUTRITION DIAGNOSIS: Inadequate oral intake related to GIB as evidenced by NPO status.   Goal: Patient will meet >/=90% of estimated nutrition needs  Monitor:  Diet advancement, PO intake, weights, labs, I/Os  Reason for Assessment: Malnutrition screening tool  80 y.o. male  Admitting Dx: UGIB (upper gastrointestinal bleed)  ASSESSMENT: 80 year old male patient with history of Crohn's disease, HTN, and s/p CABG and biventricular implanted defibrillator, admitted with GI bleed likely secondary to supratherapeutic INR coumadin. Patient has had multiple stricture surgeries related to Crohn's, with 28 inches of small bowel resection.   Patient is currently NPO, scheduled for colonoscopy. Patient's wife reports that he typically eats well, but has not had much of an appetite for the last week. Patient also complains of some nausea, but no vomiting. His weight is down 7 pounds from usual, but this is over several months.   Height: Ht Readings from Last 1 Encounters:  04/18/13 6' (1.829 m)    Weight: Wt Readings from Last 1 Encounters:  04/19/13 198 lb 7.7 oz (90.03 kg)    Ideal Body Weight: 178 pounds  % Ideal Body Weight: 111%  Wt Readings from Last 10 Encounters:  04/19/13 198 lb 7.7 oz (90.03 kg)  04/19/13 198 lb 7.7 oz (90.03 kg)  04/19/13 198 lb 7.7 oz (90.03 kg)  03/11/13 209 lb 3.2 oz (94.892 kg)  11/19/12 204 lb 6.4 oz (92.715 kg)  11/05/12 206 lb (93.441 kg)  08/14/12 205 lb (92.987 kg)  07/17/12 206 lb (93.441 kg)  05/17/12 206 lb (93.441 kg)  04/18/12 207 lb 12.8 oz (94.257 kg)    Usual Body Weight: 205 pounds  % Usual Body Weight: 97%  BMI:  Body mass index is 26.91 kg/(m^2). Patient is overweight.   Estimated Nutritional Needs: Kcal: 2000-2150  kcal Protein: 90-110 g Fluid: >2.7 L/day  Skin: Intact  Diet Order: NPO  EDUCATION NEEDS: -No education needs identified at this time   Intake/Output Summary (Last 24 hours) at 04/19/13 1225 Last data filed at 04/19/13 1208  Gross per 24 hour  Intake 1922.67 ml  Output   2050 ml  Net -127.33 ml    Last BM: 6 watery, black stools in 24 hours   Labs:   Recent Labs Lab 04/16/13 1935 04/17/13 0114 04/18/13 0306 04/19/13 0414  NA 145 147 146 148*  K 4.6 4.3 3.9 3.7  CL 107 111 113* 115*  CO2 22 24 26 22   BUN 41* 40* 21 14  CREATININE 1.11 1.03 0.99 0.99  CALCIUM 9.7 10.0 9.1 8.7  MG  --  1.9  --   --   PHOS  --  1.9*  --   --   GLUCOSE 236* 152* 119* 125*    CBG (last 3)  No results found for this basename: GLUCAP,  in the last 72 hours  Scheduled Meds: . carvedilol  3.125 mg Oral BID WC  . [START ON 04/20/2013] pantoprazole  40 mg Oral Q1200  . simvastatin  20 mg Oral q1800    Continuous Infusions: . sodium chloride 20 mL/hr at 04/19/13 0700  . sodium chloride    . dextrose      Past Medical History  Diagnosis Date  . Dizziness     Positional dizziness  . Hypertension   .  Ischemic cardiomyopathy     CABG 1994 CAD catherization 1996.[name] occluded vein graft to the diagnol, other grafts patent/  catherization 2006, no PCI/ nuclear.[name] october 2011.[name] extensive scar anteroseptal and apical.. no ischemia    . CHF (congestive heart failure)     chronic systolic  . Mitral regurgitation     mild.[name] echo november 2011  EF 35%...echo.. november 2009/ ef 35%.[name] echo november 2011  . Sinus bradycardia   . Chronotropic incompetence   . Biventricular implantable cardiac defibrillator in situ     GDT CONTAK H170-MADIT-CRT-EXPLANTED 2011 implanted defibrillator- Guidant cognis, model n119-2001 Dr. Caryl Comes (11/28/2009)  . LBBB (left bundle branch block)   . Hyperlipemia   . GERD (gastroesophageal reflux disease)   . Colonic polyp 2010    pathology not clear.   . Crohn's  disease   . Acute vascular insufficiency of intestine     Mesenteric embolus...surgery 2003  . Ventral hernia   . Hypertrophy of prostate with urinary obstruction and other lower urinary tract symptoms (LUTS)   . Degenerative joint disease   . Polymyalgia rheumatica   . Tremor   . Anxiety   . Shingles   . Vitamin B12 deficiency   . CAD (coronary artery disease)     CABG 1994  /   catheterization 1996, occluded vein graft to the diagonal, other grafts patent  /   catheterization 2006, no PCI  /  nuclear, October, 2011, extensive scar anteroseptal and apical, no ischemia  . Hx of CABG     1994  . Ejection fraction     EF 35%, echo, November, 2011  //   Is 35-40%, echo,  September 23, 2011  . Warfarin anticoagulation     Coumadin therapy for mesenteric embolus 2003  . Carotid artery disease     Doppler, February,  2012, 0-39% bilateral, recommended followup 1 year  . Edema leg     Venous Dopplers 8/13: No DVT bilaterally  . Breast tenderness     Spironolactone was stopped and eplerinone started. Patient feels better  . Thrombocytopenia 2008    Past Surgical History  Procedure Laterality Date  . Coronary artery bypass graft  1994  . Laparoscopic cholecystectomy  2002  . Elap w/ superior mesenteric artery embolectomy  1/03    by Dr. Jennette Banker  . Repair of incarcerated ventral hernia and lysis of adhesions  11/03    by Dr. Betsy Pries  . Implantation of biventric cardioverter-defibrillatorr      by Dr. Lovena Le in 8/06 Guidant Baxter 2011  . Implantation of guidant cognis device      model [id number]  . Left inguinal hernia repair with mesh  6/08    by Dr. Betsy Pries  . Prostate surgery  08/2012    by urology in [place]  . Esophagogastroduodenoscopy N/A 04/18/2013    Procedure: ESOPHAGOGASTRODUODENOSCOPY (EGD);  Surgeon: Jerene Bears, MD;  Location: [facility] ENDOSCOPY;  Service: Endoscopy;  Laterality: N/A;    Larey Seat, RD, LDN Pager #: [phone] After-Hours Pager #:  [phone]

## 2013-04-19 NOTE — Progress Notes (Signed)
Daily Rounding Note  04/19/2013, 9:23 AM  LOS: 3 days   SUBJECTIVE:       Prep completed.  Stools clear.  No bleeding reported  OBJECTIVE:         Vital signs in last 24 hours:    Temp:  [97.6 F (36.4 C)-98.2 F (36.8 C)] 97.9 F (36.6 C) (03/20 0758) Pulse Rate:  [45-82] 65 (03/20 0758) Resp:  [0-32] 18 (03/20 0758) BP: (100-153)/(50-79) 108/52 mmHg (03/20 0758) SpO2:  [96 %-100 %] 100 % (03/20 0758) Weight:  [90.03 kg (198 lb 7.7 oz)-94.348 kg (208 lb)] 90.03 kg (198 lb 7.7 oz) (03/20 0358) Last BM Date: 04/18/13 Did not reexamine.   Intake/Output from previous day: 03/19 0701 - 03/20 0700 In: 1810 [I.V.:1810] Out: 1100 [Urine:1100]  Intake/Output this shift: Total I/O In: 190 [I.V.:190] Out: -   Lab Results:  Recent Labs  04/17/13 1500 04/18/13 0306 04/19/13 0414  WBC 3.9* 3.3* 3.0*  HGB 8.5* 8.7* 8.6*  HCT 24.9* 25.1* 24.9*  PLT 59* 57* 61*   BMET  Recent Labs  04/17/13 0114 04/18/13 0306 04/19/13 0414  NA 147 146 148*  K 4.3 3.9 3.7  CL 111 113* 115*  CO2 24 26 22   GLUCOSE 152* 119* 125*  BUN 40* 21 14  CREATININE 1.03 0.99 0.99  CALCIUM 10.0 9.1 8.7   LFT  Recent Labs  04/16/13 1935 04/19/13 0414  PROT 5.6* 4.9*  ALBUMIN 3.3* 2.9*  AST 24 44*  ALT 14 26  ALKPHOS 49 52  BILITOT 2.7* 3.2*   PT/INR  Recent Labs  04/17/13 0114 04/18/13 0306  LABPROT 20.9* 17.0*  INR 1.86* 1.42   Hepatitis Panel No results found for this basename: HEPBSAG, HCVAB, HEPAIGM, HEPBIGM,  in the last 72 hours  Studies/Results: Ct Head Wo Contrast 04/18/2013   CLINICAL DATA:  Mental status change  EXAM: CT HEAD WITHOUT CONTRAST  TECHNIQUE: Contiguous axial images were obtained from the base of the skull through the vertex without intravenous contrast.  COMPARISON:  None.  FINDINGS: Ventricle size is normal. Age-appropriate atrophy. Negative for acute infarct, hemorrhage, or mass. Calvarium  intact.  Calcified cavernous carotid bilaterally.  IMPRESSION: No acute abnormality.   Electronically Signed   By: Franchot Gallo M.D.   On: 04/18/2013 14:39    ASSESMENT:   * Upper GI bleed in setting of INR of 6.1 Slowed and or stopped following FFP x 4, initiation of Protonix IV BID, vitamin K  EGD 3/19: nodular prepyloric mucosa (biopsied).  No definitive source for bleeding. "Bleed likely instigated by supratherapeutic INR due to warfarin." * Coagulopathy. Corrected with FFP/Vit K, INR currently subtherapeutic and Coumadin on hold.  Chronic Coumadin in use since had thromboembolic mesenteric infarct 2003 at which time he had embolectomy.  * ABL anemia. No PRBCs to date.  Hgb stable.  Baseline in 2013: 12.5 *  Thrombocytopenia. Dates back to 2008, platelets in 2013: in 60s.  Splenomegaly, 15.5 Cm length on CT of 8/14 in [place].  Liver with small right lobe cyst and tiny granulomas.  * Ischemic CM. S/p icd/bivalve pacemaker.  * Long hx Crohn's disease, of SB by history. Several stricture related surgeries. S/p lap LOA 2003 with ventral hernia repair with mesh.  *  Fatty liver.  * AMS. Seems like delerium. No gross deficits in limbs. CT head negative for acute findings..  * Hyperglycemia.  *  Hypoalbuminemia.      PLAN   *  Awaiting pathology from EGD. *  Colonoscopy today.      [name]  04/19/2013, 9:23 AM Pager: [phone]

## 2013-04-19 NOTE — Progress Notes (Signed)
Nutrition Consult - Brief Note  Received consult for diet education for patient's wife. Provided information on increasing protein and calories at home. RD contact information provided. RD to continue to follow for diet advancement and adequacy of oral intake.  Molli Barrows, RD, LDN, [place] Pager [phone] After Hours Pager [phone]

## 2013-04-19 NOTE — Op Note (Signed)
[facility] [place], [id number]   COLONOSCOPY PROCEDURE REPORT  PATIENT: [name], [name]  MR#: [id number] BIRTHDATE: 07-Aug-1931 , 81  yrs. old GENDER: Male ENDOSCOPIST: Jerene Bears, MD REFERRED PB:[id number] Georgiana Shore, M.D. PROCEDURE DATE:  04/19/2013 PROCEDURE:   Colonoscopy, diagnostic First Screening Colonoscopy - Avg.  risk and is 50 yrs.  old or older - No.  Prior Negative Screening - Now for repeat screening. N/A  History of Adenoma - Now for follow-up colonoscopy & has been > or = to 3 yrs.  N/A  Polyps Removed Today? No.  Recommend repeat exam, <10 yrs? No. ASA CLASS:   Class III INDICATIONS:hematochezia and melena. MEDICATIONS: These medications were titrated to patient response per physician's verbal order, Fentanyl 50 mcg IV, and Versed 3 mg IV  DESCRIPTION OF PROCEDURE:   After the risks benefits and alternatives of the procedure were thoroughly explained, informed consent was obtained.  A digital rectal exam revealed no rectal mass.   The standard adult Pentax colonoscope was introduced through the anus and advanced to the cecum, which was identified by both the appendix and ileocecal valve. No adverse events experienced.   The quality of the prep was Moviprep fair  The instrument was then slowly withdrawn as the colon was fully examined.   COLON FINDINGS: No blood or evidence of recent bleeding found in the colon.  Mild, very patchy erythema was found in the ascending colon.  This is nonspecific and not felt to explain GI bleeding. The mucosa was mildly edematous likely secondary to hypoalbuminemia.   A few small, 5 mm or less, sessile polyps were found in the transverse colon and descending colon.  Polypectomy not performed due to recent bleeding.  Retroflexed views revealed no abnormalities.      The scope was withdrawn and the procedure completed.  COMPLICATIONS: There were no complications.  ENDOSCOPIC  IMPRESSION: 1.  Mild ascending colon erythema, diffuse and mild mucosal edema without significant inflammation or inflammatory changes colon 2.   Few small sessile polyps were found in the transverse colon and descending colonl polypectomy not performed due to recent bleeding 3.    No definitive source for recent GI hemorrhage  RECOMMENDATIONS: 1.  Monitor for rebleeding. 2.  Okay to resume anticoagulation with particular attention to try to avoid supratherapeutic INR  eSigned:  Jerene Bears, MD 04/19/2013 4:30 PM      cc: The Patient

## 2013-04-19 NOTE — Progress Notes (Signed)
[facility] Progress Note Patient Name: JAPHETH DIEKMAN DOB: 11/20/1931 MRN: [id number]  Date of Service  04/19/2013   HPI/Events of Note     eICU Interventions  Started diet post-CSY   Intervention Category Minor Interventions: Routine modifications to care plan (e.g. PRN medications for pain, fever)  BYRUM,ROBERT S. 04/19/2013, 4:18 PM

## 2013-04-19 NOTE — Progress Notes (Signed)
PULMONARY / CRITICAL CARE MEDICINE   Name: [name] MRN: [id number] DOB: September 04, 1931    ADMISSION DATE:  04/16/2013 CONSULTATION DATE:  3/17  REFERRING MD :  Winfred Leeds  PRIMARY SERVICE: pccm   CHIEF COMPLAINT:  UGIB  BRIEF PATIENT DESCRIPTION:  80 year old male w/ multiple medical co-morbids: ICM w/ EF 35%, Has biventricular implanted defibrillator/PPM, HTN, HL, Crohn's diease, and chronic coumadin for mesenteric infarct back in 2003,  Presents to the ER 3/17 with 2 episodes of large volume hematochezia, AMS, hypotension (transient) and INR 6.15   SIGNIFICANT EVENTS / STUDIES:  3/19 CT head (r/o CVA): NAD 3/19 EGD: The mucosa of the esophagus appeared normal Abnormal mucosa was found in the prepyloric region of the stomach; The mucosa was nodular; biopsy performed. The stomach otherwise appeared normal The duodenal mucosa showed no abnormalities in the bulb and second portion of the duodenum. RECOMMENDATIONS: Await biopsy results. Bleeding source not identified today on EGD, though bleed likely instigated by supratherapeutic INR due to warfarin. Next diagnostic step would be colonoscopy 3/19 Ct head: NAD 3/20 Colonoscopy:   LINES / TUBES:   CULTURES:   ANTIBIOTICS:    SUBJECTIVE:  Cognition much improved. Approaching baseline. Deconditioned  VITAL SIGNS: Temp:  [97.9 F (36.6 C)-98.6 F (37 C)] 98.6 F (37 C) (03/20 1303) Pulse Rate:  [63-82] 65 (03/20 1303) Resp:  [0-32] 17 (03/20 1303) BP: (100-153)/(50-79) 127/67 mmHg (03/20 1303) SpO2:  [96 %-100 %] 98 % (03/20 1303) Weight:  [90.03 kg (198 lb 7.7 oz)-94.348 kg (208 lb)] 90.03 kg (198 lb 7.7 oz) (03/20 0358)    INTAKE / OUTPUT: Intake/Output     03/19 0701 - 03/20 0700 03/20 0701 - 03/21 0700   P.O.     I.V. (mL/kg) 1810 (20.1) 475 (5.3)   Total Intake(mL/kg) 1810 (20.1) 475 (5.3)   Urine (mL/kg/hr) 1100 (0.5) 300 (0.5)   Stool  1300 (2.2)   Total Output 1100 1600   Net +710 -1125        Urine  Occurrence 4 x    Stool Occurrence 6 x 1 x     PHYSICAL EXAMINATION: General:  NAD, RASS 0 Neuro:  No definite focal deficits HEENT: WNL  Cardiovascular:  Paced, regular, no M Lungs:  Clear to ausc and perc Abdomen:  Soft, Non-tender, + bowel sounds  Ext: warm without edema   LABS: I have reviewed all of today's lab results. Relevant abnormalities are discussed in the A/P section  CXR: NNF  ASSESSMENT / PLAN: Acute GIB exacerbated by supratherapeutic INR, resolved Transient hemorrhagic shock, resolved ICM (EF 35%) Chronic LBBB Wide complex tachycardia, resolved Transient bradycardia Hypernatremia H/o Crohn's disease H/o colonic polyps H/o mesenteric ischemia  Acute blood loss anemia Thrombocytopenia of unclear etiology Warfarin induced coagulopathy - corrected Mild Hyperglycemia without prior hx of DM Acute encephalopathy of unclear etiology, resolving  P:   Transfer to med-surg 3/20 Colonoscopy 3/20 PT ordered 3/20 Increase free water Advance diet after colonoscopy Probably not a candidate for further warfarin Monitor CBC for recovery of platelet counts Anticipate DC home first of next week   Merton Border, MD ; [facility] service Mobile [phone].  After 5:30 PM or weekends, call [phone]

## 2013-04-20 DIAGNOSIS — D62 Acute posthemorrhagic anemia: Secondary | ICD-10-CM | POA: Diagnosis not present

## 2013-04-20 DIAGNOSIS — M1990 Unspecified osteoarthritis, unspecified site: Secondary | ICD-10-CM

## 2013-04-20 DIAGNOSIS — Z7901 Long term (current) use of anticoagulants: Secondary | ICD-10-CM

## 2013-04-20 DIAGNOSIS — F411 Generalized anxiety disorder: Secondary | ICD-10-CM | POA: Diagnosis not present

## 2013-04-20 DIAGNOSIS — G4733 Obstructive sleep apnea (adult) (pediatric): Secondary | ICD-10-CM

## 2013-04-20 DIAGNOSIS — I2510 Atherosclerotic heart disease of native coronary artery without angina pectoris: Secondary | ICD-10-CM

## 2013-04-20 DIAGNOSIS — Z95810 Presence of automatic (implantable) cardiac defibrillator: Secondary | ICD-10-CM

## 2013-04-20 DIAGNOSIS — K922 Gastrointestinal hemorrhage, unspecified: Secondary | ICD-10-CM | POA: Diagnosis not present

## 2013-04-20 DIAGNOSIS — D696 Thrombocytopenia, unspecified: Secondary | ICD-10-CM

## 2013-04-20 DIAGNOSIS — Z951 Presence of aortocoronary bypass graft: Secondary | ICD-10-CM

## 2013-04-20 LAB — BASIC METABOLIC PANEL
BUN: 11 mg/dL (ref 6–23)
CALCIUM: 8.5 mg/dL (ref 8.4–10.5)
CO2: 23 mEq/L (ref 19–32)
Chloride: 111 mEq/L (ref 96–112)
Creatinine, Ser: 1.04 mg/dL (ref 0.50–1.35)
GFR calc Af Amer: 77 mL/min — ABNORMAL LOW (ref >90)
GFR, EST NON AFRICAN AMERICAN: 66 mL/min — AB (ref >90)
GLUCOSE: 97 mg/dL (ref 70–99)
Potassium: 3.6 mEq/L — ABNORMAL LOW (ref 3.7–5.3)
Sodium: 144 mEq/L (ref 137–147)

## 2013-04-20 LAB — CBC
HCT: 23.1 % — ABNORMAL LOW (ref 39.0–52.0)
Hemoglobin: 8.1 g/dL — ABNORMAL LOW (ref 13.0–17.0)
MCH: 32.1 pg (ref 26.0–34.0)
MCHC: 35.1 g/dL (ref 30.0–36.0)
MCV: 91.7 fL (ref 78.0–100.0)
PLATELETS: 62 10*3/uL — AB (ref 150–400)
RBC: 2.52 MIL/uL — ABNORMAL LOW (ref 4.22–5.81)
RDW: 15 % (ref 11.5–15.5)
WBC: 3 10*3/uL — ABNORMAL LOW (ref 4.0–10.5)

## 2013-04-20 NOTE — Evaluation (Signed)
Physical Therapy Evaluation Patient Details Name: [name] MRN: [id number] DOB: 1931/09/15 Today's Date: 04/20/2013 Time: 1133-1202 PT Time Calculation (min): 29 min  PT Assessment / Plan / Recommendation History of Present Illness  80 y.o. male admitted to [facility] on 04/16/13 with nausea, poor appetite, and progressive weakness.  He was found to have an UGIB and a supratherapeutic INR >6.  He has signficant cardiac PMHx including: CAD, CABG, acute vascualr insufficiency of the intestine (messenteric embolus surgery), and has been on chronic blood thinner since.  He also has h/o Chron's disease.    Clinical Impression  Pt is generally deconditioned and slow to move, but is getting up with very little assistance.  I encouraged him to stay out of the bed more than in it now and to go on walks with wife or RN as he is able throughout the day.  He will likely need to use a cane at first, especially when outside [place]  Pt is agreeable to consider this, but does not want to do any home therapy.  His wife states that he will "be outside [place] in the yard" as soon as he gets home.  I advised him to pace himself, that he may not be able to do all he did before right away.   PT to follow acutely for deficits listed below.       PT Assessment  Patient needs continued PT services    Follow Up Recommendations  Home health PT;Supervision - Intermittent (pt politely declined home therapy services)    Does the patient have the potential to tolerate intense rehabilitation     NA  Barriers to Discharge   None      Equipment Recommendations  None recommended by PT (pt has all equipment needed)     Recommendations for Other Services   None   Frequency Min 3X/week    Precautions / Restrictions   None  Pertinent Vitals/Pain See vitals flow sheet.       Mobility  Bed Mobility Overal bed mobility: Modified Independent General bed mobility comments:  HOB elevated and pt pulling up on railing.  Transfers Overall transfer level: Needs assistance Equipment used: Rolling walker (2 wheeled) Transfers: Sit to/from Stand Sit to Stand: Min guard General transfer comment: Min guard assist for safety Ambulation/Gait Ambulation/Gait assistance: Min guard Ambulation Distance (Feet): 150 Feet Assistive device: Rolling walker (2 wheeled) Gait Pattern/deviations: Step-through pattern;Shuffle;Trunk flexed Gait velocity: decreased Gait velocity interpretation: Below normal speed for age/gender General Gait Details: slow, shuffing gait pattern with verbal cues for safe RW use (he has never used one before) and upright posture.          PT Diagnosis: Difficulty walking;Abnormality of gait;Generalized weakness  PT Problem List: Decreased strength;Decreased activity tolerance;Decreased mobility;Decreased knowledge of use of DME PT Treatment Interventions: DME instruction;Gait training;Stair training;Functional mobility training;Therapeutic activities;Therapeutic exercise;Balance training;Neuromuscular re-education;Patient/family education     PT Goals(Current goals can be found in the care plan section) Acute Rehab PT Goals Patient Stated Goal: to go home PT Goal Formulation: With patient/family Time For Goal Achievement: 05/04/13 Potential to Achieve Goals: Good  Visit Information  Last PT Received On: 04/20/13 Assistance Needed: +1 History of Present Illness: 80 y.o. male admitted to [facility] on 04/16/13 with nausea, poor appetite, and progressive weakness.  He was found to have an UGIB and a supratherapeutic INR >6.  He has signficant cardiac PMHx including: CAD, CABG, acute vascualr insufficiency  of the intestine (messenteric embolus surgery), and has been on chronic blood thinner since.  He also has h/o Chron's disease.         Prior [place] expects to be discharged to:: Private residence Living Arrangements:  Spouse/significant other Available Help at Discharge: Family;Available 24 hours/day Type of Home: House Home Access: Level entry Home Layout: One level;Laundry or work area in basement (he hatches birds in the summer in the basement. ) Home Equipment: Environmental consultant - 2 wheels;Cane - single point;Toilet riser;Wheelchair - manual;Shower seat Additional Comments: Pt reports equipment was his uncle's Prior Function Level of Independence: Independent Communication Communication: HOH Dominant Hand:  (both, writes with left hand)    Cognition  Cognition Arousal/Alertness: Awake/alert Behavior During Therapy: WFL for tasks assessed/performed Overall Cognitive Status: Within Functional Limits for tasks assessed    Extremity/Trunk Assessment Upper Extremity Assessment Upper Extremity Assessment: Generalized weakness Lower Extremity Assessment Lower Extremity Assessment: Generalized weakness Cervical / Trunk Assessment Cervical / Trunk Assessment: Normal   Balance Balance Overall balance assessment: Needs assistance Sitting-balance support: No upper extremity supported;Feet supported Sitting balance-Leahy Scale: Good Standing balance support: No upper extremity supported Standing balance-Leahy Scale: Fair  End of Session PT - End of Session Equipment Utilized During Treatment: Gait belt Activity Tolerance: Patient tolerated treatment well Patient left: in chair;with call bell/phone within reach;with family/visitor present    Wells Guiles B. [place], [place], DPT [phone]   04/20/2013, 1:21 PM

## 2013-04-20 NOTE — Progress Notes (Signed)
PULMONARY / CRITICAL CARE MEDICINE   Name: [name] MRN: [id number] DOB: [birth year]    ADMISSION DATE:  04/16/2013 CONSULTATION DATE:  3/17  REFERRING MD :  Winfred Leeds  PRIMARY SERVICE: pccm   CHIEF COMPLAINT:  UGIB  BRIEF PATIENT DESCRIPTION:  80 year old male w/ multiple medical co-morbids: ICM w/ EF 35%, Has biventricular implanted defibrillator/PPM, HTN, HL, Crohn's diease, and chronic coumadin for mesenteric infarct back in 2003,  Presents to the ER 3/17 with 2 episodes of large volume hematochezia, AMS, hypotension (transient) and INR 6.15   SIGNIFICANT EVENTS / STUDIES:  3/19 CT head (r/o CVA): NAD 3/19 EGD: The mucosa of the esophagus appeared normal Abnormal mucosa was found in the prepyloric region of the stomach; The mucosa was nodular; biopsy performed. The stomach otherwise appeared normal The duodenal mucosa showed no abnormalities in the bulb and second portion of the duodenum. RECOMMENDATIONS: Await biopsy results. Bleeding source not identified today on EGD, though bleed likely instigated by supratherapeutic INR due to warfarin. Next diagnostic step would be colonoscopy 3/19 Ct head: NAD 3/20 Colonoscopy: NEG- no source of bleeding found; DrPyrtle rec tagged rbc scan if rebleeds...   LINES / TUBES:   CULTURES:   ANTIBIOTICS:   SUBJECTIVE:  Cognition much improved. Approaching baseline. Deconditioned  Meds sched: . carvedilol  3.125 mg Oral BID WC  . pantoprazole  40 mg Oral Q1200  . simvastatin  20 mg Oral q1800    VITAL SIGNS: Temp:  [97.8 F (36.6 C)-98.7 F (37.1 C)] 98.7 F (37.1 C) (03/21 0437) Pulse Rate:  [40-72] 63 (03/21 0437) Resp:  [16-28] 17 (03/21 0437) BP: (103-137)/(44-67) 103/56 mmHg (03/21 0437) SpO2:  [94 %-100 %] 95 % (03/21 0437) FiO2 (%):  [3 %] 3 % (03/20 1603)  Vent Mode:  [-]  FiO2 (%):  [3 %] 3 % INTAKE / OUTPUT: Intake/Output     03/20 0701 - 03/21 0700 03/21 0701 - 03/22 0700   P.O. 120    I.V. (mL/kg) 595  (6.6)    Total Intake(mL/kg) 715 (7.9)    Urine (mL/kg/hr) 300 (0.1)    Stool 1300 (0.6)    Total Output 1600     Net -885          Stool Occurrence 1 x      PHYSICAL EXAMINATION: General:  NAD, RASS 0 Neuro:  No definite focal deficits HEENT: WNL  Cardiovascular:  Paced, regular, no M Lungs:  Clear to ausc and perc Abdomen:  Soft, Non-tender, + bowel sounds  Ext: warm without edema   LABS: I have reviewed all of today's lab results. Relevant abnormalities are discussed in the A/P section Lab Results  Component Value Date   HGB 8.1* 04/20/2013   HGB 8.6* 04/19/2013   HGB 8.7* 04/18/2013   CXR 3/17> IMPRESSION: Underlying emphysema. Cardiomegaly. No edema or consolidation.  CT Head 3/19> FINDINGS: Ventricle size is normal. Age-appropriate atrophy. Negative for acute infarct, hemorrhage, or mass. Calvarium intact. Calcified cavernous carotid bilaterally.  IMPRESSION: No acute abnormality.   ASSESSMENT / PLAN: Acute GIB exacerbated by supratherapeutic INR, resolved Transient hemorrhagic shock, resolved ICM (EF 35%) Chronic LBBB Wide complex tachycardia, resolved Transient bradycardia Hypernatremia H/o Crohn's disease H/o colonic polyps H/o mesenteric ischemia  Acute blood loss anemia Thrombocytopenia of unclear etiology Warfarin induced coagulopathy - corrected Mild Hyperglycemia without prior hx of DM Acute encephalopathy of unclear etiology, resolving  P:   Transferred to 6U44 yest 04/19/13 Colonoscopy 3/20=> neg, NAD, no bleeding site  seen PT ordered 3/20 Increase free water Advance diet  ?candidate for further warfarin=> almost died 2001/04/12 w/ mesenteric embolus, surg Monitor CBC for recovery of platelet counts Lab Results  Component Value Date   PLT 62* 04/20/2013   PLT 61* 04/19/2013   PLT 57* 04/18/2013   Anticipate DC home first of next week    Kehaulani Fruin M. Lenna Gilford, MD [facility] 04/20/13 @ 8:08AM

## 2013-04-21 DIAGNOSIS — D62 Acute posthemorrhagic anemia: Secondary | ICD-10-CM | POA: Diagnosis not present

## 2013-04-21 DIAGNOSIS — K922 Gastrointestinal hemorrhage, unspecified: Secondary | ICD-10-CM | POA: Diagnosis not present

## 2013-04-21 DIAGNOSIS — Z95810 Presence of automatic (implantable) cardiac defibrillator: Secondary | ICD-10-CM | POA: Diagnosis not present

## 2013-04-21 DIAGNOSIS — I2510 Atherosclerotic heart disease of native coronary artery without angina pectoris: Secondary | ICD-10-CM | POA: Diagnosis not present

## 2013-04-21 DIAGNOSIS — K55059 Acute (reversible) ischemia of intestine, part and extent unspecified: Secondary | ICD-10-CM

## 2013-04-21 NOTE — Progress Notes (Signed)
PULMONARY / CRITICAL CARE MEDICINE   Name: LINLEY MOXLEY MRN: [id number] DOB: [birth year]    ADMISSION DATE:  May 06, 2013 CONSULTATION DATE:  May 07, 2022  REFERRING MD :  Winfred Leeds  PRIMARY SERVICE: PCCM   CHIEF COMPLAINT:  UGIB  BRIEF PATIENT DESCRIPTION:  80 year old male w/ multiple medical co-morbids: ICM w/ EF 35%, Has biventricular implanted defibrillator/PPM, HTN, HL, Crohn's diease, and chronic coumadin for mesenteric infarct back in 2003 (severe prolonged illness- almost died),  Presents to the ER 2022-05-07 with 2 episodes of large volume hematochezia, AMS, hypotension (transient) and INR 6.15 (followed carefully in [facility] w/ last two INRs=2.2)   SIGNIFICANT EVENTS / STUDIES:  3/19 CT head (r/o CVA): NAD 3/19 EGD: The mucosa of the esophagus appeared normal, Abnormal mucosa was found in the prepyloric region of the stomach; The mucosa was nodular; biopsy performed (pending). The stomach otherwise appeared normal The duodenal mucosa showed no abnormalities in the bulb and second portion of the duodenum. RECOMMENDATIONS: Await biopsy results. Bleeding source not identified today on EGD, though bleed likely instigated by supratherapeutic INR due to warfarin. Next diagnostic step would be colonoscopy 3/19 Ct head: NAD 3/20 Colonoscopy: NEG- no source of bleeding found; DrPyrtle rec tagged rbc scan if rebleeds...   LINES / TUBES:   CULTURES:   ANTIBIOTICS:   SUBJECTIVE:  Cognition much improved. Approaching baseline. Deconditioned  Meds sched: . carvedilol  3.125 mg Oral BID WC  . pantoprazole  40 mg Oral Q1200  . simvastatin  20 mg Oral q1800    VITAL SIGNS: Temp:  [97.7 F (36.5 C)-98.4 F (36.9 C)] 97.9 F (36.6 C) (03/22 0500) Pulse Rate:  [54-73] 61 (03/22 0500) Resp:  [16-18] 16 (03/22 0500) BP: (104-125)/(51-72) 104/56 mmHg (03/22 0500) SpO2:  [96 %-100 %] 96 % (03/22 0500)    INTAKE / OUTPUT: Intake/Output     03/21 0701 - 03/22 0700 03/22 0701 - 03/23 0700   P.O.      I.V. (mL/kg)     Total Intake(mL/kg)     Urine (mL/kg/hr)     Stool     Total Output       Net            Urine Occurrence 1 x      PHYSICAL EXAMINATION: General:  NAD, RASS 0 Neuro:  No definite focal deficits HEENT: WNL  Cardiovascular:  Paced, regular, no M Lungs:  Clear to ausc and perc Abdomen:  Soft, Non-tender, + bowel sounds  Ext: warm without edema   LABS: I have reviewed all of today's lab results. Relevant abnormalities are discussed in the A/P section Lab Results  Component Value Date   HGB 8.1* 04/20/2013   HGB 8.6* 04/19/2013   HGB 8.7* 04/18/2013   CXR 05/07/2022 IMPRESSION: Underlying emphysema. Cardiomegaly. No edema or consolidation.  CT Head 3/19> FINDINGS: Ventricle size is normal. Age-appropriate atrophy. Negative for acute infarct, hemorrhage, or mass. Calvarium intact. Calcified cavernous carotid bilaterally.  IMPRESSION: No acute abnormality.   ASSESSMENT / PLAN: Acute GIB exacerbated by supratherapeutic INR, resolved Transient hemorrhagic shock, resolved ICM (EF 35%) Chronic LBBB Wide complex tachycardia, resolved Transient bradycardia Hypernatremia H/o Crohn's disease H/o colonic polyps H/o mesenteric ischemia  Acute blood loss anemia Thrombocytopenia of unclear etiology Warfarin induced coagulopathy - corrected Mild Hyperglycemia without prior hx of DM Acute encephalopathy of unclear etiology, resolving  P:   Transferred to 7Q73 on 04/19/13. EGD 3/19=> nodular mucosa in duod, bx pending. Colonoscopy 3/20=> neg, NAD, no bleeding  site seen, rec for tagged rbc scan if rebleeds. PT ordered 3/20 Encourage intake po, & advance diet. ?candidate for further warfarin=> almost died 04-02-2001 w/ mesenteric embolus, surg (will decide later) Monitor CBC for recovery of platelet counts Lab Results  Component Value Date   PLT 62* 04/20/2013   PLT 61* 04/19/2013   PLT 57* 04/18/2013   F/u labs in AM 3/23 Anticipate DC home soon    [place]. Lenna Gilford,  MD [facility] 04/21/13 @ 7:58AM

## 2013-04-22 ENCOUNTER — Encounter (HOSPITAL_COMMUNITY): Payer: Self-pay | Admitting: Internal Medicine

## 2013-04-22 ENCOUNTER — Telehealth: Payer: Self-pay | Admitting: Cardiology

## 2013-04-22 DIAGNOSIS — D62 Acute posthemorrhagic anemia: Secondary | ICD-10-CM | POA: Diagnosis not present

## 2013-04-22 DIAGNOSIS — K922 Gastrointestinal hemorrhage, unspecified: Secondary | ICD-10-CM | POA: Diagnosis not present

## 2013-04-22 DIAGNOSIS — R578 Other shock: Secondary | ICD-10-CM | POA: Diagnosis not present

## 2013-04-22 DIAGNOSIS — I5022 Chronic systolic (congestive) heart failure: Secondary | ICD-10-CM | POA: Diagnosis not present

## 2013-04-22 LAB — COMPREHENSIVE METABOLIC PANEL
ALBUMIN: 2.7 g/dL — AB (ref 3.5–5.2)
ALK PHOS: 65 U/L (ref 39–117)
ALT: 24 U/L (ref 0–53)
AST: 30 U/L (ref 0–37)
BILIRUBIN TOTAL: 1.8 mg/dL — AB (ref 0.3–1.2)
BUN: 12 mg/dL (ref 6–23)
CO2: 26 meq/L (ref 19–32)
CREATININE: 0.92 mg/dL (ref 0.50–1.35)
Calcium: 8.8 mg/dL (ref 8.4–10.5)
Chloride: 105 mEq/L (ref 96–112)
GFR calc Af Amer: >90 mL/min (ref >90)
GFR calc non Af Amer: 78 mL/min — ABNORMAL LOW (ref >90)
Glucose, Bld: 96 mg/dL (ref 70–99)
POTASSIUM: 3.6 meq/L — AB (ref 3.7–5.3)
SODIUM: 141 meq/L (ref 137–147)
Total Protein: 4.8 g/dL — ABNORMAL LOW (ref 6.0–8.3)

## 2013-04-22 LAB — PROTIME-INR
INR: 1.23 (ref 0.00–1.49)
Prothrombin Time: 15.2 seconds (ref 11.6–15.2)

## 2013-04-22 LAB — CBC WITH DIFFERENTIAL/PLATELET
BASOS ABS: 0 10*3/uL (ref 0.0–0.1)
BASOS PCT: 0 % (ref 0–1)
Eosinophils Absolute: 0.1 10*3/uL (ref 0.0–0.7)
Eosinophils Relative: 3 % (ref 0–5)
HCT: 23.3 % — ABNORMAL LOW (ref 39.0–52.0)
Hemoglobin: 8.2 g/dL — ABNORMAL LOW (ref 13.0–17.0)
Lymphocytes Relative: 37 % (ref 12–46)
Lymphs Abs: 1.1 10*3/uL (ref 0.7–4.0)
MCH: 31.8 pg (ref 26.0–34.0)
MCHC: 35.2 g/dL (ref 30.0–36.0)
MCV: 90.3 fL (ref 78.0–100.0)
Monocytes Absolute: 0.6 10*3/uL (ref 0.1–1.0)
Monocytes Relative: 20 % — ABNORMAL HIGH (ref 3–12)
NEUTROS PCT: 41 % — AB (ref 43–77)
Neutro Abs: 1.2 10*3/uL — ABNORMAL LOW (ref 1.7–7.7)
PLATELETS: 66 10*3/uL — AB (ref 150–400)
RBC: 2.58 MIL/uL — ABNORMAL LOW (ref 4.22–5.81)
RDW: 15.9 % — AB (ref 11.5–15.5)
WBC: 3 10*3/uL — ABNORMAL LOW (ref 4.0–10.5)

## 2013-04-22 MED ORDER — PANTOPRAZOLE SODIUM 40 MG PO TBEC: 40.0000 mg | DELAYED_RELEASE_TABLET | Freq: Every day | ORAL | Status: DC

## 2013-04-22 MED ORDER — CARVEDILOL 6.25 MG PO TABS: 3.1250 mg | ORAL_TABLET | Freq: Two times a day (BID) | ORAL | Status: DC

## 2013-04-22 MED ORDER — WARFARIN SODIUM 5 MG PO TABS: 5.0000 mg | ORAL_TABLET | Freq: Every day | ORAL | Status: DC

## 2013-04-22 NOTE — Telephone Encounter (Signed)
Patient was recently hospitalized with GI bleed, and supratherapeutic INR of 6.15 on 04/16/13.  Patient wasn't taking any new medications prior to this, however apparently he was nauseated and having diarrhea, which could have resulted in an elevated INR.  GI did a comprehensive workup, which was all negative.  Patient was discharged home on 04/22/13 with instruction to stay off warfarin until cleared by cardiology to restart.  INR was 1.23 on 04/22/13 at discharge.  GI has given the okay to resume warfarin.  Patient started on warfarin in 3578 for thromboembolic mesenteric infarct.  Dr. [name], do you want patient to restart warfarin and/or aspirin at this time?  Do you feel the patient needs to come in to be seen prior to making this decision?  Please advise.

## 2013-04-22 NOTE — Telephone Encounter (Signed)
Left message for patient to call back.  Need to restart warfarin at 5 mg qd except 10 mg T/Th/Sat and recheck INR 6-7 days later.  Has warfarin 5 mg pills.  Will await patient's return call.

## 2013-04-22 NOTE — Telephone Encounter (Signed)
Spoke with wife at length.  She was nervous about restarting warfarin, but wants to do what Dr. [name] instructed him to do.  We will restart warfarin at a lower dosage than usual (5 mg qd) and recheck INR in 1 week.  They are scheduled to see Dr. Jeannine Kitten office and our office on 05/01/13, so will restart warfarin 5 mg qd tomorrow, and recheck INR 1 week later.  Wife instructed to stop warfarin immediately if any signs of bleeding occur.

## 2013-04-22 NOTE — Telephone Encounter (Signed)
Please restart Coumadin immediately with careful followup. Do not restart aspirin at this time

## 2013-04-22 NOTE — Discharge Summary (Signed)
Physician Discharge Summary  Patient ID: [name] MRN: [id number] DOB/AGE: [birth year] 80 y.o.  Admit date: 04/24/13 Discharge date: 04/22/2013    Discharge Diagnoses:  Principal Problem:   UGIB (upper gastrointestinal bleed) Active Problems:   CARDIOMYOPATHY, ISCHEMIC  s/p CABG     LBBB   Crohn's disease   Warfarin anticoagulation   Warfarin-induced coagulopathy   Acute blood loss anemia   Acute encephalopathy   GIB (gastrointestinal bleeding)         D/c plan by Discharge Diagnosis  Acute GIB - in setting coumadin admin with supratherapeutic INR.  Blood loss anemia  Thrombocytopenia - unknown etiology - improving slowly  Warfarin induced coagulopathy  D/c plan -  D/c home OFF coumadin, asa  Will need to decide when/if to resume coumadin - ok from GI standpoint  F/u cbc  GI f/u as needed  Cont protonix   Hx HTN - bp remains marginal on only half dose home coreg Ischemic cardiomyopathy (EF 66%)  Chronic systolic CHF  D/c plan -  D/c home on half dose coreg Hold lisinopril, lasix, inspra - resume as outpt if bp ok  F/u bmet  Pt instructed to call for increased swelling, weight gain or SOB prior to outpt f/u  outpt cards f/u    Brief Hospital Summary: [name] is a 80 year old male w/ multiple medical co-morbids: ICM w/ EF 35%, Has biventricular implanted defibrillator/PPM, HTN, HL, Crohn's diease, and chronic coumadin for mesenteric infarct back in 2003 (severe prolonged illness- almost died), Presents to the ER 04/25/22 with 2 episodes of large volume hematochezia, AMS, hypotension (transient) and INR 6.15 (?etiologyu of acute change - followed carefully in [facility] w/ last two INRs=2.2).  Coumadin was stopped and his coagulopathy was corrected, treated with blood products and volume.  Underwent EGD on 3/20 which revealed few small polyps but no definitive source of bleeding.  Has improved slowly, worked with physical therapy and is near baseline and ready for d/c  home, off coumadin.    SIGNIFICANT EVENTS / STUDIES:  3/19 CT head (r/o CVA): NAD  3/19 EGD: The mucosa of the esophagus appeared normal, Abnormal mucosa was found in the prepyloric region of the stomach; The mucosa was nodular; biopsy performed (pending). The stomach otherwise appeared normal The duodenal mucosa showed no abnormalities in the bulb and second portion of the duodenum. RECOMMENDATIONS: Await biopsy results. Bleeding source not identified today on EGD, though bleed likely instigated by supratherapeutic INR due to warfarin. Next diagnostic step would be colonoscopy  3/19 Ct head: NAD  3/20 Colonoscopy: NEG- no source of bleeding found; DrPyrtle rec tagged rbc scan if rebleeds...   LINES / TUBES:   CULTURES:  none  ANTIBIOTICS: none                                                                  Filed Vitals:   04/21/13 1420 04/21/13 1701 04/21/13 2100 04/22/13 0500  BP: 117/69 110/57 101/55 111/58  Pulse: 62 61 60 64  Temp: 98.3 F (36.8 C)  97.9 F (36.6 C) 98 F (36.7 C)  TempSrc: Oral     Resp: 18  18 18   Height:      Weight:      SpO2: 98%  97% 97%     Discharge Labs  BMET  Recent Labs Lab 04/16/13 1935 04/17/13 0114 04/18/13 0306 04/19/13 0414 04/20/13 0433 04/22/13 0643  NA 145 147 146 148* 144 141  K 4.6 4.3 3.9 3.7 3.6* 3.6*  CL 107 111 113* 115* 111 105  CO2 22 24 26 22 23 26   GLUCOSE 236* 152* 119* 125* 97 96  BUN 41* 40* 21 14 11 12   CREATININE 1.11 1.03 0.99 0.99 1.04 0.92  CALCIUM 9.7 10.0 9.1 8.7 8.5 8.8  MG  --  1.9  --   --   --   --   PHOS  --  1.9*  --   --   --   --      CBC   Recent Labs Lab 04/19/13 0414 04/20/13 0433 04/22/13 0643  HGB 8.6* 8.1* 8.2*  HCT 24.9* 23.1* 23.3*  WBC 3.0* 3.0* 3.0*  PLT 61* 62* 66*   Anti-Coagulation  Recent Labs Lab 04/16/13 1935 04/17/13 0114 04/18/13 0306 04/22/13 0643  INR 6.15* 1.86* 1.42 1.23      Discharge Orders   Future Appointments Provider Department Dept  Phone   04/25/2013 11:00 AM [facility] [address] [phone]   05/01/2013 9:00 AM Melvenia Needles, NP [facility] [phone]   05/01/2013 11:00 AM [facility] [place] [facility] [phone]   05/20/2013 11:00 AM Carlena Bjornstad, MD [place] [facility] [phone]   05/23/2013 8:15 AM [facility] [place] [facility] [phone]   09/09/2013 11:30 AM Noralee Space, MD [facility] [phone]   Future Orders Complete By Expires   (HEART FAILURE PATIENTS) Call MD:  Anytime you have any of the following symptoms: 1) 3 pound weight gain in 24 hours or 5 pounds in 1 week 2) shortness of breath, with or without a dry hacking cough 3) swelling in the hands, feet or stomach 4) if you have to sleep on extra pillows at night in order to breathe.  As directed    Call MD for:  difficulty breathing, headache or visual disturbances  As directed    Call MD for:  persistant dizziness or light-headedness  As directed    Call MD for:  persistant nausea and vomiting  As directed    Diet - low sodium heart healthy  As directed    Increase activity slowly  As directed            Follow-up Information   Follow up with PARRETT,TAMMY, NP On 05/01/2013. (9am with Dr. Jeannine Kitten nurse practitioner )    Specialty:  Nurse Practitioner   Contact information:   [address] [place] [address] [phone]          Medication List    STOP taking these medications       aspirin EC 81 MG tablet     eplerenone 25 MG tablet  Commonly known as:  INSPRA     furosemide 40 MG tablet  Commonly known as:  LASIX     ibuprofen 200 MG tablet  Commonly known as:  ADVIL,MOTRIN     lisinopril 10 MG tablet  Commonly known as:  PRINIVIL,ZESTRIL     warfarin 5 MG tablet  Commonly known as:  COUMADIN      TAKE these medications       azelastine 137 MCG/SPRAY nasal spray  Commonly known  as:  ASTELIN  Place 1 spray into both nostrils 2 (two) times daily. Use in each nostril as directed     B-12 1000 MCG Caps  Take 1,000 mcg by mouth daily.     carvedilol 6.25 MG tablet  Commonly known as:  COREG  Take 0.5 tablets (3.125 mg total) by mouth 2 (two) times daily with a meal.     cholecalciferol 1000 UNITS tablet  Commonly known as:  VITAMIN D  Take 1,000 Units by mouth daily.     fluticasone 50 MCG/ACT nasal spray  Commonly known as:  FLONASE  Place 2 sprays into both nostrils daily.     pantoprazole 40 MG tablet  Commonly known as:  PROTONIX  Take 1 tablet (40 mg total) by mouth daily at 12 noon.     simvastatin 20 MG tablet  Commonly known as:  ZOCOR  Take 20 mg by mouth daily.       Disposition: 01-Home or Self Care  Discharged Condition: THURMON MIZELL has met maximum benefit of inpatient care and is medically stable and cleared for discharge.  Patient is pending follow up as above.     Time spent on disposition:  Greater than 35 minutes.   SignedDarlina Sicilian, NP 04/22/2013  10:28 AM Pager: [phone] or [phone]  *Care during the described time interval was provided by me and/or other providers on the critical care team. I have reviewed this patient's available data, including medical history, events of note, physical examination and test results as part of my evaluation.  Doree Fudge, MD [facility] [place] Pager: [phone]

## 2013-04-22 NOTE — Telephone Encounter (Signed)
New Prob     Pt just discharged from hospital today. States his Moises Blood was drawn during visit. Was advised to call for new follow up coumadin appt. Please call.

## 2013-04-25 ENCOUNTER — Encounter: Payer: Self-pay | Admitting: Internal Medicine

## 2013-04-25 ENCOUNTER — Ambulatory Visit (HOSPITAL_COMMUNITY): Payer: Medicare Other | Attending: Internal Medicine | Admitting: *Deleted

## 2013-04-25 DIAGNOSIS — I6529 Occlusion and stenosis of unspecified carotid artery: Secondary | ICD-10-CM | POA: Insufficient documentation

## 2013-04-25 NOTE — Progress Notes (Signed)
Carotid Duplex Complete

## 2013-04-26 ENCOUNTER — Encounter: Payer: Self-pay | Admitting: Cardiology

## 2013-04-29 ENCOUNTER — Other Ambulatory Visit (INDEPENDENT_AMBULATORY_CARE_PROVIDER_SITE_OTHER): Payer: Medicare Other

## 2013-04-29 ENCOUNTER — Encounter: Payer: Self-pay | Admitting: Adult Health

## 2013-04-29 ENCOUNTER — Ambulatory Visit: Payer: Medicare Other | Admitting: Adult Health

## 2013-04-29 VITALS — BP 120/72 | HR 56 | Temp 97.6°F | Ht 72.0 in | Wt 223.0 lb

## 2013-04-29 DIAGNOSIS — T45515A Adverse effect of anticoagulants, initial encounter: Secondary | ICD-10-CM | POA: Diagnosis not present

## 2013-04-29 DIAGNOSIS — K922 Gastrointestinal hemorrhage, unspecified: Secondary | ICD-10-CM

## 2013-04-29 DIAGNOSIS — D6832 Hemorrhagic disorder due to extrinsic circulating anticoagulants: Secondary | ICD-10-CM

## 2013-04-29 DIAGNOSIS — I5022 Chronic systolic (congestive) heart failure: Secondary | ICD-10-CM

## 2013-04-29 DIAGNOSIS — D689 Coagulation defect, unspecified: Secondary | ICD-10-CM | POA: Diagnosis not present

## 2013-04-29 DIAGNOSIS — I10 Essential (primary) hypertension: Secondary | ICD-10-CM

## 2013-04-29 DIAGNOSIS — K55069 Acute infarction of intestine, part and extent unspecified: Secondary | ICD-10-CM

## 2013-04-29 LAB — BASIC METABOLIC PANEL
BUN: 8 mg/dL (ref 6–23)
CALCIUM: 9 mg/dL (ref 8.4–10.5)
CO2: 29 meq/L (ref 19–32)
Chloride: 105 mEq/L (ref 96–112)
Creatinine, Ser: 1.1 mg/dL (ref 0.4–1.5)
GFR: 71.46 mL/min (ref >60.00)
Glucose, Bld: 103 mg/dL — ABNORMAL HIGH (ref 70–99)
Potassium: 4.8 mEq/L (ref 3.5–5.1)
Sodium: 138 mEq/L (ref 135–145)

## 2013-04-29 LAB — PROTIME-INR
INR: 2 ratio — ABNORMAL HIGH (ref 0.8–1.0)
Prothrombin Time: 20.9 s — ABNORMAL HIGH (ref 10.2–12.4)

## 2013-04-29 NOTE — Assessment & Plan Note (Signed)
Controlled depsite on lower dose BB and off ACE  Although may need ACE restarted at low dose as hx of chf  Once see how b/p does on diuretic  Has follow up with cards in 2 weeks

## 2013-04-29 NOTE — Assessment & Plan Note (Signed)
Edema and wt increase off lasix  Will restart lasix at lower dose -45m daily  Check bmet today

## 2013-04-29 NOTE — Patient Instructions (Signed)
Restart Furosemide 63m (note this is a lower dose )  Continue on Coreg 6.236m1/2 Twice daily   Labs today w/ INR and BMET  I will send INR to coumadin clinic  Check blood pressure daily , keep log , call if b/p <90 or >160  Call at any site of bleeding. Hold coumadin immediately and seek medical attention.  Dr. NaLenna Gilfords retiring and we will need to set you up with a new Primary Care provider.  Please let usKoreanow if you would like usKoreao assist with this referral.   [facility]

## 2013-04-29 NOTE — Progress Notes (Signed)
Subjective:    Patient ID: [name], male    DOB: 02/10/1931, 81 y.o.   MRN: [id number]  HPI 81 y/o   ~  followed by DrChowdhury/ now DrHolmes in [place] w/ sm bowel Crohn's- s/p mult resections w/ complications, stable on 6-MP for prevention of recurrence... also has sl incr fasting bili c/w Gilbert's... ~  followed by DrKatz for Cards- w/ CAD, s/p CABG, Cardiomyopathy w/ AICD- stable... f/u 2DEcho 11/09 showed diffuseHK w/ EF= 35% & DD, mod incr AoV thickness w/o AS... EKG= pacer rhythm.  ~  November 30, 2011:  6mo ROV & [name] is c/o a URI/ cold/ sinus infection w/ congestion/ drainage/ beige sput/ etc; we discussed ZPak & Saline for treatment... We reviewed the following medical problems during today's office visit>>     OSA> eval by DrClance 8/13 & started on CPAP but having some interface problems & they are checking download for compliance etc...    HBP> on Coreg6.25Bid, Lisin10, Lasix40-1.5/d, Aldactone25; BP= 116/72 & he denies any CP, palpit, ch in SOB/ edema/ etc...    CAD, Cardiomyopathy> on ASA81 & Coumadin; s/p CABG 1994, AICD placed seen by DrKatz 10/13 & Lasix increased to 60mg/d; last seen by DrKlein 7/13 & ICD functioning normally...    Chol> on Simva20; not fasting today & his last FLP 10/12 showed TChol 124, TG 109, HDL 49, LDL 54    GI- GERD, Polyps> stable & he currently denies abd pain, n/v, c/d, blood seen...    Crohn's dis, Hx SBO> he is followed by DrHolmes, GI in [place] and he sent [name] to a Hematologist in [place] due to low platelets & they have stopped his 6MP; Crohn's has been stable since then 7 he indicated he may start a biologic med in the future...    Hx mesenteric vasc insuffic w/ embolectomy> on Coumadin & monitored in the [facility], stable...    BPH> aware- prev checked by drNesi, PSAs have been wnl...    DJD, PMR> on B12, VitD, Folate; stable & not requiring pain meds or Pred...    Tremor> eval by DrYan in the past w/ trial of Clonazepam w/ some  improvement...    Anxiety> not currently on Klonopin etc...    Low [place] ct> He saw a Hematologist in [place] on referral by DrHolmes, GI in [place]; Plats ranged 67-73K & they stopped his 6MP Rx for crohn's dis... We reviewed prob list, meds, xrays and labs> see below for updates >>   ~  April 18, 2012:  4mo ROV & [name] has several complaints/ concerns as listed below>>      C/o an URI w/ head congestion, drainage, etc but denies f/c/s, cough/ sput/ SOB & we discussed OTC meds to use prn...    BP controlled on meds> Coreg6.25Bid, Lisin10, Lasix40-1.5/d, Aldactone25; BP= 110/70 & he denies CP, palpit, SOB, etc...    He is c/o bilat breast swelling, sore & tender; he is on Aldactone25 & this is the likely culprit; we will switch to Eplerenone25...    He remains on Simva20 for his Chol & last FLP was 10/12 w/ TChol 124, TG 109, HDL 49, LDL 54; he needs to ret fasting for f/u labs...    He saw DrHolmes GI in [place] for f/u Crohn's 12/13> stable on Cholestyramine 4gm/d...    Wife says he's grumpy; offered med rx- SSRI etc but he declines...     He reports a Melanoma removed from left calf area recently w/ some   post op swelling; path showed superfic spreading melanoma & it is being managed by DrLupton w/ Bx then wide excision We reviewed prob list, meds, xrays and labs> see below for updates >> he remains on VitD, B12, & Folate...  ~  July 17, 2012:  26moROV & add-on appt at wife's request due to feet swelling L>R w/ some discoloration & pain on left foot- hurts to walk on it x 1wk;  When pressed for explanation [name] mentioned that he did in fact drop a monkey wrench on his left foot ~1wk ago;  Exam shows ven insuff w/ some mild edema on right but left foot has several ecchymoses, 3+ edema, some redness (no broken skin etc) & tender over 5th metatarsal area (top, side, & bottom);  XRay shows marked sublux vs dislocation at 5th MTP joint, no acute fx, +for osseous demineralization, etc...   REC> refer to Ortho  ASAP...    Other medical issues appear stable>  BP=132/78 on his Coreg6.25Bid, Lisinopril10, Lasix40-2Qam, & Eplerenone25 (all breast tenderness has resolved);  He saw DBenay Spice4/14- CAD, cardiomyopathy, some incr edema & Lasix incr to 80Qam;  Stable on Coumadin & his AICD is functioning normally;  Chol looks good on his diet + Simva20;  He has [place] & [facility] to [facility] (likely due to his prev 6MP rx...   ~  November 05, 2012:  3-449moOV & [name] saw Ortho after his last appt- DrHilts felts he had a chr dislocated 5th MTP, used compression wrap & his swelling improved over time;  In the interim he has developed prostate trouble- [facility] but we don't have any records; apparently he had Hematuria work up w/ urine, XRays, cysto- told to have BPH and had ?TURP? (we have requested records to review), he notes that urine stream is better now... We reviewed the following medical problems during today's office visit >>     OSA> eval by DrClance 8/13 & started on CPAP but having some interface problems & he is not currently using the apparatus...    HBP> on Coreg6.25Bid, Lisin10, Lasix40-1.5/d, Inspra25; BP= 140/86 & he denies any CP, palpit, ch in SOB/ edema/ etc...    CAD, Cardiomyopathy> on ASA81 & Coumadin; s/p CABG 1994, AICD placed & last seen by DrKlein 7/14- ICD functioning normally; CAD, s/p CABG w/ one occluded graft, EF=35%, Myoview 10/11 w/ scar, no ischemia; Lasix adjusted to improve periph edema...     Chol> on Simva20; not fasting today & his last FLP 10/12 showed TChol 124, TG 109, HDL 49, LDL 54... Needs to ret for f/u FLP.    GI- GERD, Polyps> stable & he currently denies abd pain, n/v, c/d, blood seen...    Crohn's dis, Hx SBO> he is followed by DrHolmes, GI [place] and he [place] [name] [place] & they have stopped his 6MP; Crohn's has been  stable since then & he indicated he may start a biologic med in the future...    Hx mesenteric vasc insuffic w/ embolectomy> on Coumadin & monitored in the [facility], stable...    BPH> aware- [facility]; [facility]...    DJD, PMR> on B12, VitD, Folate; stable & not requiring pain meds or Pred...Marland KitchenMarland Kitchen  Tremor> eval by DrYan in the past w/ trial of Clonazepam w/ some improvement...    Anxiety> not currently on Klonopin etc...    Low [place] ct> He saw a Hematologist in [place] on referral by DrHolmes, GI in [place]; Plats ranged 67-73K & they stopped his 6MP Rx for crohn's dis & he needs f/u labs... We reviewed prob list, meds, xrays and labs> see below for updates >>  HE NEEDS TO RET FOR f/u FASTING blood work.....   04/29/2013 Post Hospital follow up  Patient returns for a post hospital followup. Patient was admitted March 17 through March 23 for GI bleed. Presented to the ER 3/17 with 2 episodes of large volume hematochezia, AMS, hypotension (transient) and INR 6.15   Coumadin was stopped and his coagulopathy was corrected, treated with blood products and volume. Underwent EGD on 3/20 which revealed few small polyps but no definitive source of bleeding. Discharged home off coumadin.  Coumadin was restarted by cards last week.  He denies any known bleeding. Patient says he is still very weak from his hospitalization. However, denies any chest pain, orthopnea, PND. Patient's blood pressure medications were stopped during his hospitalization. Due to hypotension. He was restarted on Korey at 3.125 mg twice daily. Lisinopril and furosemide were held. Patient complains that he had increased leg swelling. Since discharge and weight is up 10 pounds.         Problem List:  OSA >> He had a sleep study w/ AHI=34- loud snoring & abnormal breathing pattern noted by wife; pt notes freq awakenings & not feeling rested in  the AM; but he denied issues w/ daytime sleepiness & his ESS= only1... Started on CPAP & they are following up... ~  He reports mask/ interface problems and he is not using the CPAP; asked to check w/ DME & f/u w/ DrClance...  HBP, CAD, Cardiomyopathy >> followed by DrKatz and DrKlein... he had CABG in 1994 (with cath in 96 revealing a total right and an 80% OM, vein graft to the diagonal was occluded, other bypasses were patent), Biv AICD in 2006...  On COUMADIN (follow in clinic), ASA 81mg/d,  COREG 6.25mgBid,  LISINOPRIL 20mg/d,  LASIX 40mg/d,  SPIRONOLACTONE 25mg/d... ~  Cardiolite 3/03 w/ EF=37%...  ~  Cath 6/06 w/ native vessel and graft disease...  ~  2DEcho 11/09 stable w/ mod LVD- diffuse HK w/ EF= 35%, DD, mod incr AoV thickness w/o AS...  ~  3/11:  CXR showed stable CABG, AICD, & chr changes... ~  10/11:  Myoview showed extensive scar, no ischemia, EF= 34% ~  10/11:  New AICD placed by DrKlein... ~  11/11:  2DEcho showed mod reduced LV sys function w/ EF= 30-35%, diffuse HK, gr1 DD> see report... ~  10/12:  BP= 126/68 & stable from the CV perspective... ~  4/13:  He's had f/u w/ Cards> DrKatz et al; notes reviewed, no med changes other than Coumadin regulation... ~  7/13:  His AICD was interrogated by DrKlein & no problems identified... ~  8/13:  2DEcho showed mild LVH, mod decr LVF w/ EF=35-40% w/ HK of the entire myocardium, Gr1DD, mild AoV sclerosis, mild MR, normal RV... ~  8/13:  VenDopplers of LEs ordered by Cards- neg for DVT ~  10/13:  He had f/u DrKatz who noted edema is diminished on the Lasix60... ~  10/13: on Coreg6.25Bid, Lisin10, Lasix40-1.5/d, Aldactone25; BP= 116/72 & he denies any CP, palpit, ch in SOB/ edema/ etc... ~  3/14:    BP controlled on meds> Coreg6.25Bid, Lisin10, Lasix40-1.5/d, Aldactone25; BP= 110/70 & he denies CP, palpit, SOB, etc; but he has developed gynecomastia from the aldactone & we will switch to Eplerenone25... ~  6/14:  BP=132/78 on his  Coreg6.25Bid, Lisinopril10, Lasix40-2Qam, & Eplerenone25 (all breast tenderness has resolved);  He saw [name] 4/14- CAD, cardiomyopathy, some incr edema & Lasix incr to 80Qam... ~  10/14: on Coreg6.25Bid, Lisin10, Lasix40-1.5/d, Inspra25; BP= 140/86 & he denies any CP, palpit, ch in SOB/ edema/ etc; Also on ASA81 & Coumadin; s/p CABG 1994, AICD placed & last seen by DrKlein 7/14- ICD functioning normally; CAD, s/p CABG w/ one occluded graft, EF=35%, Myoview 10/11 w/ scar, no ischemia; Lasix adjusted to improve periph edema  HYPERLIPIDEMIA - he takes SIMVASTATIN 44m/d... he's had min, non-progressive incr LFTs noted. ~  [place] showed TChol 138, TG 138, HDL 38, LDL 72... tolerating med well. ~  [place] showed TChol 118, TG 111, HDL 33, LDL 63 ~  FLP 12/09 showed TChol 118, TG 100, HDL 31, LDL 67 ~  FLP 10/10 on Simva20 showed TChol 111, TG 139, HDL 36, LDL 47 ~  FLP 10/11 on Simva20 showed TChol 101, TG 102, HDL 35, LDL 45 ~  FLP 10/12 on Simva20 showed TChol 124, TG 109, HDL 49, LDL 54 ~  FLP 10/13 & 3/14> pt wasn't fasting for blood work today... ~  He needs to ret for f/u FLP...  GERD (ICD-530.81) - no active symptoms and not currently taking PPI etc...  COLONIC POLYPS (ICD-211.3) - last colonoscopy here was 7/04 by DDemetra Shinerw/ divertics and several polyps from 6 - 228msize, reportedly adenomatous on bx but I cannot find the path reports... he is overdue for f/u colon... ~  labs 2/10 showed Hg= 15.7...Marland KitchenMarland Kitchene will send copy of note to DrChowdhury w/ request for f/u colonoscopy. ~  f/u colonoscopy 11/10 by DrChowdhury showed norm TI & several polyps removed- path +tubular adenoma...  CROHN'S DISEASE (ICD-555.9) - [place]Chowdhury [place] 6-MP 5062mabs- 1.5 tabs/d... he notes intermittent diarrhea and was started on Questran Prn... ~  labs 3/11 noted sl incr LFTs, Protime, & Sed... ~  labs 10/11 showed norm chems & LFTs x incr bili, Plat=88K, BNP=219, otherw norm. ~  Labs 10/12  showed norm chems, SGOT=59, TBili=3.9, Plat=85K... ~  [facility]> off 6MP due to thrombocytopenia & doing satis, considering a biologic if needed, considering repeat colonoscopy... ~  [facility] from [facility]/13> doing satis on Cholestyramine4gm daily, no changes made...  Hx of ACUTE VASCULAR INSUFFICIENCY OF INTESTINE (ICD-557.0) - he has embolectomy of the superior mesenteric artery 1/03 by DrLSheryn Bisonong hospitalization w/ complications)... he has remained on COUMADIN in the coumadin clinic ever since then...  VENTRAL HERNIA (ICD-553.20) - he is s/p left inguinal hernia repair 6/08 by DrIClarita Lebernd had a prev incarcerated ventral hernia repaired 11/03... his residual/ recurrent ventral hernia has been evaluated by DrIClarita Lebero [place]... currently he wears an abd binder when up and about...  SBO >> Hosp 3/14 - 04/19/11 by [facility] w/ SBO that opened up w/o surgical intervention... ~  CT Abd 3/13 showed early SBO pattern from an anterior wall hernia defect; ?GE varicies noted, s/p GB, hepatic steatosis, calcif granulomas in liver, cyst in right kidney, atherosclerotic ulcer in infrarenal Ao, ectasia of iliacs...  HYPERTROPHY PROSTATE W/UR OBST & OTH LUTS (ICD-600.01) - saw  DrNesi 7/10 w/ BPH, obstructive symptoms, and ED- given  Flomax & "shots"...  ~  Labs 10/12 showed PSA= 2.93 ~  10/14: he tells me he had Prostate surg by DrHanspol in [place], we do not have records & have requested these...  DEGENERATIVE JOINT DISEASE (ICD-715.90) >>  ~  6/14:  He presented w/ swelling in the feet, but L>R w/ pain & tender laterally; then he admitted to dropping a monkey wrench on his foot 1 week ago; XRay showed subluxed/ dislocated 5th MTP=> to Ortho ASAP...  Hx of POLYMYALGIA RHEUMATICA (ICD-725) - hasn't req steroids x yrs... ~  labs 2/10 showed Sed= 8 ~  labs 10/10 showed Sed= 6 ~  labs 3/11 showed Sed= 51 w/ acute illness. ~  labs  10/11 showed Sed= 9 ~  Labs 10/12 showed Sed= 9  TREMOR (ICD-781.0) - [facility]- essential tremor w/ +FamHx in sister... trial Clonazepam 0.25mg Bid w/ improvement...  ANXIETY (ICD-300.00) - he uses the same Clonazepam 0.25mg Prn...  Hx of SHINGLES (ICD-053.9)  Thrombocytopenia >> serial labs w/ Plat counts betw 223 ==> 85; no bleeding diathesis, & he had acute intestinal vasc insuffic 2003 requiring embolectomy & has been on Coumadin Rx ever since then... ~  He saw a Hematologist in [place] on referral by DrHolmes, GI in [place]; Plats ranged 67-73K & they stopped his 6MP Rx for crohn's dis & he needs f/u labs.  VITAMIN B12 DEFICIENCY (ICD-266.2) - on B 12 1000mcg/d OTC... ~  labs 2/10 showed B12 level = 215... rec start oral B12 supplement daily. ~  labs 10/10 showed B12 level = 416... continue oral B12 supplement. ~  labs 10/11 showed B12= 592... continue same. ~  Labs 10/12 showed B12= 573  VITAMIN D DEFICIENCY (ICD-268.9) - on Vit D 1000 u daily OTC... ~  labs 2/10 showed Vit D level = 23... rec> start Vit D 1000 u daily.. ~  Labs 10/12 showed Vit D level = 50   Past Surgical History  Procedure Laterality Date  . Coronary artery bypass graft  1994  . Laparoscopic cholecystectomy  2002  . Elap w/ superior mesenteric artery embolectomy  1/03    by Dr. Lawsom  . Repair of incarcerated ventral hernia and lysis of adhesions  11/03    by Dr. Hingram  . Implantation of biventric cardioverter-defibrillatorr      by Dr. Taylor in 8/06 Guidant Contak H170-explanted 2011  . Implantation of guidant cognis device      model [id number]  . Left inguinal hernia repair with mesh  6/08    by Dr. Hingram  . Prostate surgery  08/2012    by urology in [place]  . Esophagogastroduodenoscopy N/A 04/18/2013    Procedure: ESOPHAGOGASTRODUODENOSCOPY (EGD);  Surgeon: Jay M Pyrtle, MD;  Location: [facility];  Service: Endoscopy;  Laterality: N/A;  . Colonoscopy N/A  04/19/2013    Procedure: COLONOSCOPY;  Surgeon: Jay M Pyrtle, MD;  Location: [facility] ENDOSCOPY;  Service: Endoscopy;  Laterality: N/A;    Outpatient Encounter Prescriptions as of 04/29/2013  Medication Sig  . azelastine (ASTELIN) 137 MCG/SPRAY nasal spray Place 1 spray into both nostrils 2 (two) times daily. Use in each nostril as directed  . carvedilol (COREG) 6.25 MG tablet Take 0.5 tablets (3.125 mg total) by mouth 2 (two) times daily with a meal.  . cholecalciferol (VITAMIN D) 1000 UNITS tablet Take 1,000 Units by mouth daily.   . Cyanocobalamin (B-12) 1000 MCG CAPS Take 1,000 mcg by   mouth daily.   . fluticasone (FLONASE) 50 MCG/ACT nasal spray Place 2 sprays into both nostrils daily.  . pantoprazole (PROTONIX) 40 MG tablet Take 1 tablet (40 mg total) by mouth daily at 12 noon.  . simvastatin (ZOCOR) 20 MG tablet Take 20 mg by mouth daily.  . warfarin (COUMADIN) 5 MG tablet Take 1 tablet (5 mg total) by mouth daily.    No Known Allergies   Current Medications, Allergies, Past Medical History, Past Surgical History, Family History, and Social History were reviewed in [facility] electronic medical record.    Review of Systems         See HPI - all other systems neg except as noted...   The patient denies anorexia, fever, weight loss, weight gain, vision loss, hoarseness, chest pain, syncope, peripheral edema, prolonged cough, headaches, hemoptysis, abdominal pain, melena, hematochezia, severe indigestion/heartburn, hematuria, incontinence, muscle weakness, suspicious skin lesions, transient blindness, difficulty walking, depression, unusual weight change, abnormal bleeding, enlarged lymph nodes, and angioedema.     Objective:   Physical Exam     WD, WN, 81 y/o WM in NAD... GENERAL:  Alert & oriented; pleasant & cooperative... HEENT:  [place]/AT, EOM-wnl, PERRLA, EACs-clear, TMs-wnl, THROAT-clear & wnl. NECK:  Supple w/ fairROM; no JVD; normal carotid impulses w/o bruits; no thyromegaly  or nodules palpated; no lymphadenopathy. CHEST:  Decr BS w/ few scat rhonchi, no wheezing, no rales, no signs of consolidation. HEART:  Regular Rhythm; gr 1/6 SEM without rubs or gallops detected...1-2+ edema  ABDOMEN:  Soft & nontender; mult scars, & ventral hernia; normal bowel sounds; no organomegaly or masses palpated. EXT: without deformities, mod arthritic changes; no varicose veins/ venous insuffic/  NEURO:  no focal neuro deficits... DERM:  No lesions noted; no rash etc...  

## 2013-04-29 NOTE — Assessment & Plan Note (Signed)
Back on coumadin  Advised on dangers/risks /benefits  If any site of bleeding will need immediate medical attention  Check INR today , fax to [facility]

## 2013-04-29 NOTE — Assessment & Plan Note (Signed)
Recent GIB on coumadin  egd w/ no source of bleeding  Suspected from supratherapeutic on coumadin   Watch closely as pt is back on coumadin  Refer back to GI for post hospital

## 2013-04-30 ENCOUNTER — Ambulatory Visit (INDEPENDENT_AMBULATORY_CARE_PROVIDER_SITE_OTHER): Payer: Medicare Other | Admitting: Internal Medicine

## 2013-04-30 DIAGNOSIS — K55069 Acute infarction of intestine, part and extent unspecified: Secondary | ICD-10-CM

## 2013-04-30 DIAGNOSIS — K55059 Acute (reversible) ischemia of intestine, part and extent unspecified: Secondary | ICD-10-CM

## 2013-04-30 LAB — CBC WITH DIFFERENTIAL/PLATELET
BASOS PCT: 0.7 % (ref 0.0–3.0)
Basophils Absolute: 0 10*3/uL (ref 0.0–0.1)
EOS PCT: 1.9 % (ref 0.0–5.0)
Eosinophils Absolute: 0.1 10*3/uL (ref 0.0–0.7)
HEMATOCRIT: 30.6 % — AB (ref 39.0–52.0)
HEMOGLOBIN: 10.1 g/dL — AB (ref 13.0–17.0)
LYMPHS ABS: 1.2 10*3/uL (ref 0.7–4.0)
Lymphocytes Relative: 22.1 % (ref 12.0–46.0)
MCHC: 33.1 g/dL (ref 30.0–36.0)
MCV: 91.3 fl (ref 78.0–100.0)
MONO ABS: 0.7 10*3/uL (ref 0.1–1.0)
MONOS PCT: 12.2 % — AB (ref 3.0–12.0)
NEUTROS ABS: 3.5 10*3/uL (ref 1.4–7.7)
Neutrophils Relative %: 63.1 % (ref 43.0–77.0)
Platelets: 115 10*3/uL — ABNORMAL LOW (ref 150.0–400.0)
RBC: 3.35 Mil/uL — AB (ref 4.22–5.81)
RDW: 15.5 % — ABNORMAL HIGH (ref 11.5–14.6)
WBC: 5.6 10*3/uL (ref 4.5–10.5)

## 2013-05-01 ENCOUNTER — Inpatient Hospital Stay: Payer: Medicare Other | Admitting: Adult Health

## 2013-05-01 NOTE — Progress Notes (Signed)
Quick Note:  Called spoke with patient's spouse. Advised of lab results / recs as stated by TP. Spouse verbalized understanding and denied any questions. ______

## 2013-05-03 NOTE — Progress Notes (Signed)
Subjective:    Patient ID: [name], male    DOB: 1931-08-28, 80 y.o.   MRN: [id number]  HPI 80 y/o WM here for a follow up visit... he has multiple medical problems as noted below...    ~  followed by [name]/ now [name] in [place] w/ sm bowel Crohn's- s/p mult resections w/ complications, stable on 6-MP for prevention of recurrence... also has sl incr fasting bili c/w Gilbert's... ~  followed by [name] for Cards- w/ CAD, s/p CABG, Cardiomyopathy w/ AICD- stable... f/u 2DEcho 11/09 showed diffuseHK w/ EF= 35% & DD, mod incr AoV thickness w/o AS... EKG= pacer rhythm.  ~  November 30, 2011:  60moROV & [name] Glaseris c/o a URI/ cold/ sinus infection w/ congestion/ drainage/ beige sput/ etc; we discussed ZPak & Saline for treatment... We reviewed the following medical problems during today's office visit>>     OSA> eval by [name] 8/13 & started on CPAP but having some interface problems & they are checking download for compliance etc...    HBP> on Coreg6.25Bid, Lisin10, Lasix40-1.5/d, Aldactone25; BP= 116/72 & he denies any CP, palpit, ch in SOB/ edema/ etc...    CAD, Cardiomyopathy> on ASA81 & Coumadin; s/p CABG 1994, AICD placed seen by DKatherine Shaw Bethea [name]/13 & Lasix increased to 667md; last seen by [name] 7/13 & ICD functioning normally...    Chol> on Simva20; not fasting today & his last FLP 10/12 showed TChol 124, TG 109, HDL 49, LDL 54    GI- GERD, Polyps> stable & he currently denies abd pain, n/v, c/d, blood seen...    Crohn's dis, Hx SBO> he is followed by [name], [place] and [name] & they have stopped his 6MP; Crohn's has been stable since then 7 he indicated he may start a biologic med in the future...    Hx mesenteric vasc insuffic w/ embolectomy> on Coumadin & monitored in the CC, stable...    BPH> aware- prev checked by [name], PSAs have been wnl...    DJD, PMR> on B12, VitD, Folate; stable & not requiring pain meds or  Pred...    Tremor> eval by [name] Handingn the past w/ trial of Clonazepam w/ some improvement...    Anxiety> not currently on Klonopin etc...    [place] ct> [name], [place]; Plats ranged 67-73K & they stopped his 6MP Rx for crohn's dis... We reviewed prob list, meds, xrays and labs> see below for updates >>   ~  April 18, 2012:  33m90moVRidgelys several complaints/ concerns as listed below>>      C/o an URI w/ head congestion, drainage, etc but denies f/c/s, cough/ sput/ SOB & we discussed OTC meds to use prn...    BP controlled on meds> Coreg6.25Bid, Lisin10, Lasix40-1.5/d, Aldactone25; BP= 110/70 & he denies CP, palpit, SOB, etc...    He is c/o bilat breast swelling, sore & tender; he is on Aldactone25 & this is the likely culprit; we will switch to Eplerenone25...    He remains on Simva20 for his Chol & last FLP was 10/12 w/ TChol 124, TG 109, HDL 49, LDL 54; he needs to ret fasting for f/u labs...    He saw [name] GI in [place] for f/u Crohn's 12/13> stable on Cholestyramine 4gm/d...    Wife says he's grumpy; offered med rx- SSRI etc but he declines...Marland KitchenMarland Kitchen  He reports a Melanoma removed from left calf area recently w/ some post op swelling; path showed superfic spreading melanoma & it is being managed by [name] w/ Bx then wide excision We reviewed prob list, meds, xrays and labs> see below for updates >> he remains on VitD, B12, & Folate...  ~  July 17, 2012:  42moROV & add-on appt at wife's request due to feet swelling L>R w/ some discoloration & pain on left foot- hurts to walk on it x 1wk;  When pressed for explanation [name] mentioned that he did in fact drop a monkey wrench on his left foot ~1wk ago;  Exam shows ven insuff w/ some mild edema on right but left foot has several ecchymoses, 3+ edema, some redness (no broken skin etc) & tender over 5th metatarsal area (top, side, & bottom);  XRay shows marked sublux vs dislocation at 5th MTP  joint, no acute fx, +for osseous demineralization, etc...   REC> refer to Ortho ASAP...    Other medical issues appear stable>  BP=132/78 on his Coreg6.25Bid, Lisinopril10, Lasix40-2Qam, & Eplerenone25 (all breast tenderness has resolved);  He saw DBenay Spice4/14- CAD, cardiomyopathy, some incr edema & Lasix incr to 80Qam;  Stable on Coumadin & his AICD is functioning normally;  Chol looks good on his diet + Simva20;  He has [place] f[place] & [facility] to [facility] (likely due to his prev 6MP rx...   ~  November 05, 2012:  3-436moOV & [name] saw Ortho after his last appt- DrHilts felts he had a chr dislocated 5th MTP, used compression wrap & his swelling improved over time;  In the interim he has developed prostate trouble- [facility] but we don't have any records; apparently he had Hematuria work up w/ urine, XRays, cysto- told to have BPH and had ?TURP? (we have requested records to review), he notes that urine stream is better now... We reviewed the following medical problems during today's office visit >>     OSA> eval by [name] 8/13 & started on CPAP but having some interface problems & he is not currently using the apparatus...    HBP> on Coreg6.25Bid, Lisin10, Lasix40-1.5/d, Inspra25; BP= 140/86 & he denies any CP, palpit, ch in SOB/ edema/ etc...    CAD, Cardiomyopathy> on ASA81 & Coumadin; s/p CABG 1994, AICD placed & last seen by [name] 7/14- ICD functioning normally; CAD, s/p CABG w/ one occluded graft, EF=35%, Myoview 10/11 w/ scar, no ischemia; Lasix adjusted to improve periph edema...     Chol> on Simva20; not fasting today & his last FLP 10/12 showed TChol 124, TG 109, HDL 49, LDL 54... Needs to ret for f/u FLP.    GI- GERD, Polyps> stable & he currently denies abd pain, n/v, c/d, blood seen...    Crohn's dis, Hx SBO> he is followed by [name], [place] and [name] & they have stopped his 6MP; Crohn's has been stable since then & he indicated he may start a biologic med in the future...    Hx mesenteric vasc insuffic w/ embolectomy> on Coumadin & monitored in the CC, stable...    B[facility]> aware[facility]; [facility]...    DJD, PMR>  on B12, VitD, Folate; stable & not requiring pain meds or Pred... He has seen DrHilts for left foot pain, VI, chr dislocated 5th MTP, plantar fasciitis...    Tremor> eval by Dalphine [name] in the past w/ trial of Clonazepam w/ some improvement...    Anxiety> not currently on Klonopin etc...    [place] ct> He saw a Hematologist in [place] on referral by [name], GI in [place]; Plats ranged 67-73K & they stopped his 6MP Rx for crohn's dis & he needs f/u labs... We reviewed prob list, meds, xrays and labs> see below for updates >>  HE NEEDS TO RET FOR f/u FASTING blood work...  ~  March 11, 2013:  40moROV & [name] Glaserhas been staqble> CC is nasal congestion, drainage, stopped up; we doiscussed avail OTC meds to help w/ these symptoms- antihist, saline, fluticasone, and we will write for Astelin as well...  BP controlled on Coreg6.25Bid, Lisin10, Lasix40-1.5/d, Inspra25; BP= 136/82 & he feels ok w/o HA, CP, palpit, SOB, edema, etc...  He saw DrKatz 10/14> f/u cardiomyopathy, on Coumadin w/ hx mesenteric embolus, doing satis, no changes made Chol looks good on Simva20 w/ FLP 2/15 showing TChol 170, TG 175, HDL 61, LDL 74; discussed low fat diet... He reports being stable from the GI standpoint & stopped his Questran; hasn't been back to his GI in awhile...  [place] hx hematuria & flank pain, BPH w/ obstruction; pt had a TURP in [place] (we do not have op note etc)...  Known thrombocytopenia, [place] "it's just my pattern" he says...    We reviewed prob list,  meds, xrays and labs> see below for updates >> Pt was given Pneumovax & TDAP today...  LABS 2/15:  FLP- Chol ok on Simva20 but TG=175 discussed diet;  Chems- ok x incr fasting bili c/w Gilbert's;  CBC- wnl x low platelets stable at 72K;  TSH=1.59;  Sed=2...          Problem List:  OSA >> He had a sleep study w/ AHI=34- loud snoring & abnormal breathing pattern noted by wife; pt notes freq awakenings & not feeling rested in the AM; but he denied issues w/ daytime sleepiness & his ESS= only1... Started on CPAP & they are following up... ~  He reports mask/ interface problems and he is not using the CPAP; asked to check w/ DME & f/u w/ [name]...  HBP, CAD, Cardiomyopathy >> followed by DBenay Spiceand [name]... he had CABG in 1994 (with cath in 96 revealing a total right and an 80% OM, vein graft to the diagonal was occluded, other bypasses were patent), Biv AICD in 2006...  On COUMADIN (follow in clinic), ASA 839md,  COREG 6.2534md,  LISINOPRIL 6m44m  LASIX 40mg74m SPIRONOLACTONE 25mg/61m ~  Cardiolite 3/03 w/ EF=37%...  ~  Cath 6/06 w/ native vessel and graft disease...  ~  2DEcho 11/09 stable w/ mod LVD- diffuse HK w/ EF= 35%, DD, mod incr AoV thickness w/o AS...  ~  3/11:  CXR showed stable CABG, AICD, & chr changes... ~  10/11:  Myoview showed extensive scar, no ischemia, EF= 34% ~  10/11:  New AICD placed by [name]... ~  11/11:  2DEcho showed mod reduced LV sys function w/ EF= 30-35%, diffuse HK, gr1 DD> see report... ~  10/12:  BP= 126/68 & stable from the CV perspective... ~  4/13:  He's had f/u w/ Cards> DrKatz et al; notes reviewed, no  med changes other than Coumadin regulation... ~  7/13:  His AICD was interrogated by [name] & no problems identified... ~  8/13:  2DEcho showed mild LVH, mod decr LVF w/ EF=35-40% w/ HK of the entire myocardium, Gr1DD, mild AoV sclerosis, mild MR, normal RV... ~  8/13:  VenDopplers of LEs ordered by Cards- neg for DVT ~  10/13:  He had f/u DrKatz  who noted edema is diminished on the Lasix60... ~  10/13: on Coreg6.25Bid, Lisin10, Lasix40-1.5/d, Aldactone25; BP= 116/72 & he denies any CP, palpit, ch in SOB/ edema/ etc... ~  3/14:  BP controlled on meds> Coreg6.25Bid, Lisin10, Lasix40-1.5/d, Aldactone25; BP= 110/70 & he denies CP, palpit, SOB, etc; but he has developed gynecomastia from the aldactone & we will switch to Eplerenone25... ~  6/14:  BP=132/78 on his Coreg6.25Bid, Lisinopril10, Lasix40-2Qam, & Eplerenone25 (all breast tenderness has resolved);  He saw [name] 4/14- CAD, cardiomyopathy, some incr edema & Lasix incr to 80Qam... ~  10/14: on Coreg6.25Bid, Lisin10, Lasix40-1.5/d, Inspra25; BP= 140/86 & he denies any CP, palpit, ch in SOB/ edema/ etc; Also on ASA81 & Coumadin; s/p CABG 1994, AICD placed & last seen by [name] 7/14- ICD functioning normally; CAD, s/p CABG w/ one occluded graft, EF=35%, Myoview 10/11 w/ scar, no ischemia; Lasix adjusted to improve periph edema ~  2/15: BP controlled on Coreg6.25Bid, Lisin10, Lasix40-1.5/d, Inspra25; BP= 136/82 & he feels ok w/o HA, CP, palpit, SOB, edema, etc; He saw DrKatz 10/14> f/u cardiomyopathy, on Coumadin w/ hx mesenteric embolus, doing satis, no changes made.  HYPERLIPIDEMIA - he takes SIMVASTATIN 11m/d... he's had min, non-progressive incr LFTs noted. ~  FAlamo4/08 showed TChol 138, TG 138, HDL 38, LDL 72... tolerating med well. ~  FButler4/09 showed TChol 118, TG 111, HDL 33, LDL 63 ~  FLP 12/09 showed TChol 118, TG 100, HDL 31, LDL 67 ~  FLP 10/10 on Simva20 showed TChol 111, TG 139, HDL 36, LDL 47 ~  FLP 10/11 on Simva20 showed TChol 101, TG 102, HDL 35, LDL 45 ~  FLP 10/12 on Simva20 showed TChol 124, TG 109, HDL 49, LDL 54 ~  FLP 10/13 & 3/14> pt wasn't fasting for blood work today... ~  FElysian2/15 on Simva20 showed TChol 170, TG 175, HDL 61, LDL 74  GERD (ICD-530.81) - no active symptoms and not currently taking PPI etc...  COLONIC POLYPS (ICD-211.3) - last colonoscopy here was  7/04 by DDemetra Shinerw/ divertics and several polyps from 6 - 237msize, reportedly adenomatous on bx but I cannot find the path reports... he is overdue for f/u colon... ~  labs 2/10 showed Hg= 15.7...Marland KitchenMarland Kitchene will send copy of note to [name] w/ request for f/u colonoscopy. ~  f/u colonoscopy 11/10 by [name] showed norm TI & several polyps removed- path +tubular adenoma...  CROHN'S DISEASE (ICD-555.9) - he is followed by [name] in [place] on 6-MP 5035mabs- 1.5 tabs/d... he notes intermittent diarrhea and was started on Questran Prn... ~  labs 3/11 noted sl incr LFTs, Protime, & Sed... ~  labs 10/11 showed norm chems & LFTs x incr bili, Plat=88K, BNP=219, otherw norm. ~  Labs 10/12 showed norm chems, SGOT=59, TBili=3.9, Plat=85K... ~  [facility]> off 6MP due to thrombocytopenia & doing satis, considering a biologic if needed, considering repeat colonoscopy... ~  [facility]> doing satis on Cholestyramine4gm daily, no changes made... ~  2/15: He reports being stable from the GI standpoint & stopped his Questran;  hasn't been back to his GI in awhile...   Hx of ACUTE VASCULAR INSUFFICIENCY OF INTESTINE (ICD-557.0) - he has embolectomy of the superior mesenteric artery 1/03 by Sheryn Bison (long hospitalization w/ complications)... he has remained on COUMADIN in the coumadin clinic ever since then...  VENTRAL HERNIA (ICD-553.20) - he is s/p left inguinal hernia repair 6/08 by Clarita Leber, and had a prev incarcerated ventral hernia repaired 11/03... his residual/ recurrent ventral hernia has been evaluated by Clarita Leber who feels that he would need eval in [place] if this hernia comes to surg... currently he wears an abd binder when up and about...  SBO >> Hosp 3/14 - 04/19/11 by [facility] w/ SBO that opened up w/o surgical intervention... ~  CT Abd 3/13 showed early SBO pattern from an anterior wall hernia defect; ?GE varicies noted, s/p GB, hepatic steatosis, calcif granulomas in  liver, cyst in right kidney, atherosclerotic ulcer in infrarenal Ao, ectasia of iliacs...  HYPERTROPHY PROSTATE W/UR OBST & OTH LUTS (ICD-600.01) - saw [name] 7/10 w/ BPH, obstructive symptoms, and ED- given  Flomax & "shots"...  ~  Labs 10/12 showed PSA= 2.93 ~  10/14: he tells me he had Prostate surg by [facility] in [place], we do not have records & have requested these... ~  2/15: He had Urology eval in [place], DrHanspal> hx hematuria & flank pain, BPH w/ obstruction; pt had a TURP in  (we do not have op note etc)...   DEGENERATIVE JOINT DISEASE (ICD-715.90) >>  ~  6/14:  He presented w/ swelling in the feet, but L>R w/ pain & tender laterally; then he admitted to dropping a monkey wrench on his foot 1 week ago; XRay showed subluxed/ dislocated 5th MTP=> to Ortho ASAP...  Hx of POLYMYALGIA RHEUMATICA (ICD-725) - hasn't req steroids x yrs... ~  labs 2/10 showed Sed= 8 ~  labs 10/10 showed Sed= 6 ~  labs 3/11 showed Sed= 51 w/ acute illness. ~  labs 10/11 showed Sed= 9 ~  Labs 10/12 showed Sed= 9  TREMOR (ICD-781.0) - [facility] 3[name]- essential tremor w/ +FamHx in sister... trial Clonazepam 0.79m Bid w/ improvement...  ANXIETY (ICD-300.00) - he uses the same Clonazepam 0.235mPrn...  Hx of SHINGLES (ICD-053.9)  Thrombocytopenia >> serial labs w/ Plat counts betw 223 ==> 85; no bleeding diathesis, & he had acute intestinal vasc insuffic 2003 requiring embolectomy & has been on Coumadin Rx ever since then... ~  [place] [name], [place]; Plats ranged 67-73K & they stopped his 6MP Rx for crohn's dis & he needs f/u labs.  VITAMIN B12 DEFICIENCY (ICD-266.2) - on B 12 100068md OTC... ~  labs 2/10 showed B12 level = 215... rec start oral B12 supplement daily. ~  labs 10/10 showed B12 level = 416... continue oral B12 supplement. ~  labs 10/11 showed B12= 592... continue same. ~  Labs 10/12 showed B12= 573  VITAMIN  D DEFICIENCY (ICD-268.9) - on Vit D 1000 u daily OTC... ~  labs 2/10 showed Vit D level = 23... rec> start Vit D 1000 u daily.. ~  Labs 10/12 showed Vit D level = 50   Past Surgical History  Procedure Laterality Date  . Coronary artery bypass graft  1994  . Laparoscopic cholecystectomy  2002  . Elap w/ superior mesenteric artery embolectomy  1/03    by Dr. LawJennette Banker Repair of incarcerated ventral hernia and lysis of adhesions  11/03  by Dr. Betsy Pries  . Implantation of biventric cardioverter-defibrillatorr      by Dr. Lovena Le in 8/06 Guidant Contak H170-explanted 2011  . Implantation of guidant cognis device      model [id number]  . Left inguinal hernia repair with mesh  6/08    by Dr. Betsy Pries  . Prostate surgery  08/2012    by urology in [place]  . Esophagogastroduodenoscopy N/A 04/18/2013    Procedure: ESOPHAGOGASTRODUODENOSCOPY (EGD);  Surgeon: Jerene Bears, MD;  Location[facility] ENDOSCOPY;  Service: Endoscopy;  Laterality: N/A;  . Colonoscopy N/A 04/19/2013    Procedure: COLONOSCOPY;  Surgeon: Jerene Bears, MD;  Location: [facility] ENDOSCOPY;  Service: Endoscopy;  Laterality: N/A;    Outpatient Encounter Prescriptions as of 03/11/2013  Medication Sig  . cholecalciferol (VITAMIN D) 1000 UNITS tablet Take 1,000 Units by mouth daily.   . Cyanocobalamin (B-12) 1000 MCG CAPS Take 1,000 mcg by mouth daily.   . [DISCONTINUED] aspirin 81 MG tablet Take 81 mg by mouth daily.    . [DISCONTINUED] carvedilol (COREG) 6.25 MG tablet Take 1 tablet by mouth  twice a day with meals.  . [DISCONTINUED] eplerenone (INSPRA) 25 MG tablet Take 1 tablet by mouth  daily  . [DISCONTINUED] folic acid (FOLVITE) 1 MG tablet Take 1 mg by mouth daily.    . [DISCONTINUED] furosemide (LASIX) 40 MG tablet Take 1.5 tablets (60 mg  total) by mouth daily.  . [DISCONTINUED] lisinopril (PRINIVIL,ZESTRIL) 10 MG tablet Take 1 tablet (10 mg total) by mouth daily.  . [DISCONTINUED] simvastatin (ZOCOR) 20 MG tablet Take 1 tablet (20  mg total) by mouth at bedtime.  . [DISCONTINUED] warfarin (COUMADIN) 5 MG tablet Take as directed by anticoagulation clinic  . [DISCONTINUED] azelastine (ASTELIN) 137 MCG/SPRAY nasal spray Use 1-2 sprays in each nostril BID.  . [DISCONTINUED] azithromycin (ZITHROMAX) 250 MG tablet Take as directed  . [DISCONTINUED] fluticasone (FLONASE) 50 MCG/ACT nasal spray 2 sprays in each nostril qhs    No Known Allergies   Current Medications, Allergies, Past Medical History, Past Surgical History, Family History, and Social History were reviewed in [employer] record.    Review of Systems         See HPI - all other systems neg except as noted... The patient complains of decreased hearing and dyspnea on exertion.  The patient denies anorexia, fever, weight loss, weight gain, vision loss, hoarseness, chest pain, syncope, peripheral edema, prolonged cough, headaches, hemoptysis, abdominal pain, melena, hematochezia, severe indigestion/heartburn, hematuria, incontinence, muscle weakness, suspicious skin lesions, transient blindness, difficulty walking, depression, unusual weight change, abnormal bleeding, enlarged lymph nodes, and angioedema.     Objective:   Physical Exam     WD, WN, 80 y/o WM in NAD... GENERAL:  Alert & oriented; pleasant & cooperative... HEENT:  [place]/AT, EOM-wnl, PERRLA, EACs-clear, TMs-wnl, NOSE- sl red/ inflamm, THROAT-clear & wnl. NECK:  Supple w/ fairROM; no JVD; normal carotid impulses w/o bruits; no thyromegaly or nodules palpated; no lymphadenopathy. CHEST:  Decr BS w/ few scat rhonchi, no wheezing, no rales, no signs of consolidation. HEART:  Regular Rhythm; gr 1/6 SEM without rubs or gallops detected... ABDOMEN:  Soft & nontender; mult scars, & ventral hernia; normal bowel sounds; no organomegaly or masses palpated. EXT: without deformities, mod arthritic changes; no varicose veins/ venous insuffic/ or edema. NEURO:  CN's intact; I do not apprec a  tremor at present; no focal neuro deficits... DERM:  No lesions noted; no rash etc...  RADIOLOGY DATA:  Reviewed  in the [facility] EMR & discussed w/ the patient...  LABORATORY DATA:  Reviewed in the [facility]IC EMR & discussed w/ the patient...   Assessment & Plan:    HBP>  BP controlled, continue same meds, except change from Aldactone to Eplerenone, and try to incr activity...  CAD/ Cardiomyopathy>  Followed by [name] & [name], their notes are reviewed...  Hyperlipid>  On Simva20 + diet> continue same...  GI>  [place] now by [facility] as [name] has moved to [facility]; continue meds & close f/u w/ GI; now off 6MP rx...  Hx vasc insuffic intestines>  SMA embolectomy 2003, he remains on Coumadin via the CC...  Hx SBO requiring Hosp 3/13 but resolved w/ conserv approach per DrHIngram, CCS...  Left FOOT PAIN => dislocated left 5th MTP joint; seen by DrHilts who felt this was chronic, treated edema w/ compression hose & swelling is down...  Other medical problems as noted.Marland KitchenMarland Kitchen

## 2013-05-06 ENCOUNTER — Ambulatory Visit (INDEPENDENT_AMBULATORY_CARE_PROVIDER_SITE_OTHER): Payer: Medicare Other

## 2013-05-06 DIAGNOSIS — K55069 Acute infarction of intestine, part and extent unspecified: Secondary | ICD-10-CM

## 2013-05-06 DIAGNOSIS — K55059 Acute (reversible) ischemia of intestine, part and extent unspecified: Secondary | ICD-10-CM

## 2013-05-06 LAB — POCT INR: INR: 1.4

## 2013-05-13 ENCOUNTER — Ambulatory Visit (INDEPENDENT_AMBULATORY_CARE_PROVIDER_SITE_OTHER): Payer: Medicare Other | Admitting: Pharmacist

## 2013-05-13 DIAGNOSIS — K55059 Acute (reversible) ischemia of intestine, part and extent unspecified: Secondary | ICD-10-CM

## 2013-05-13 DIAGNOSIS — K55069 Acute infarction of intestine, part and extent unspecified: Secondary | ICD-10-CM

## 2013-05-13 LAB — POCT INR: INR: 2

## 2013-05-13 IMAGING — CT CT ABD-PELV W/ CM
1 of 4 series · 13 of 32 positions shown, 18 images · IV contrast (APPLIED)
Comparison: None.

CLINICAL DATA: Abdominal pain

CT ABDOMEN AND PELVIS WITH CONTRAST
TECHNIQUE: Multidetector CT imaging of the abdomen and pelvis was
performed following the standard protocol during bolus
administration of intravenous contrast.
Contrast: 100mL OMNIPAQUE IOHEXOL 300 MG/ML IJ SOLN

[Series 2: abd/pel with · axial · 0.90mm/px · z∈[+826,+1252]mm · 13 of 95 slices shown, 18 images]
[im 5/95  soft-tissue]
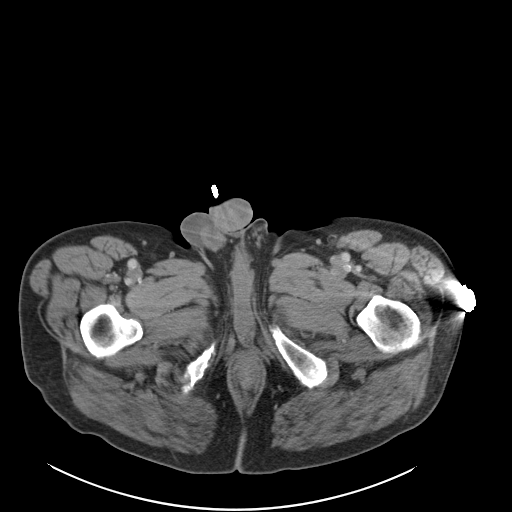
[im 5/95  bone]
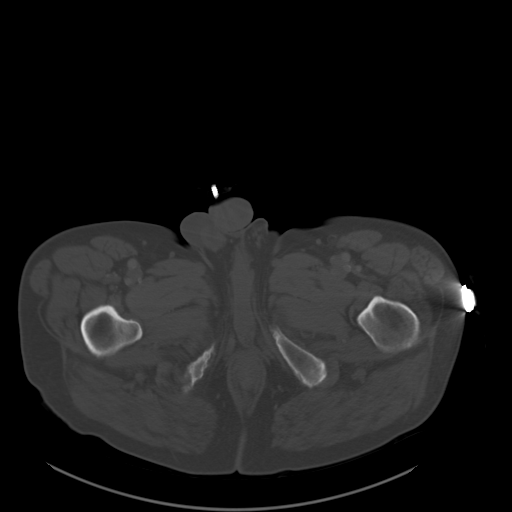
[im 15/95  soft-tissue]
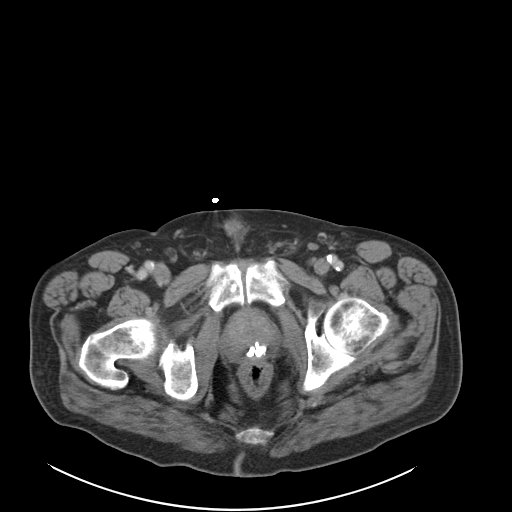
[im 20/95  soft-tissue]
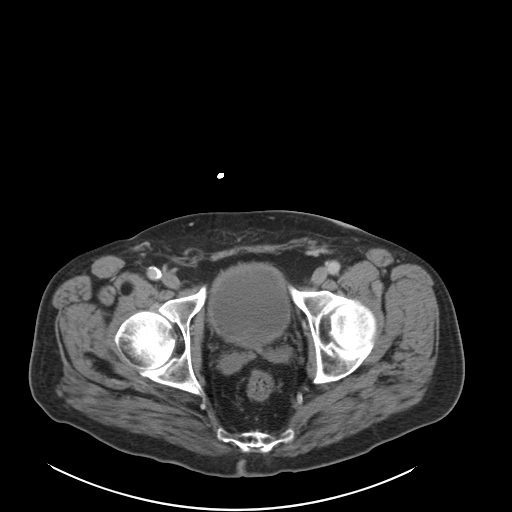
[im 30/95  soft-tissue]
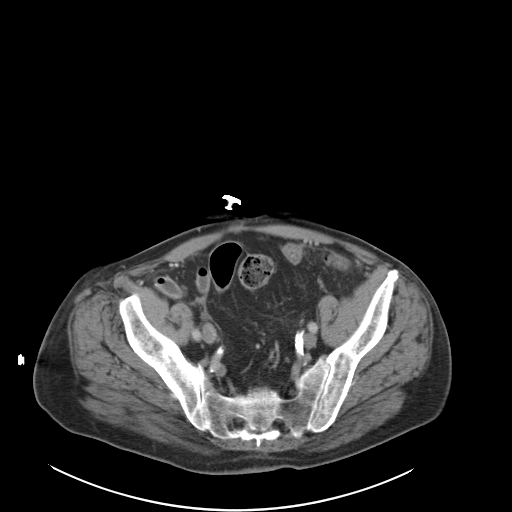
[im 35/95  soft-tissue]
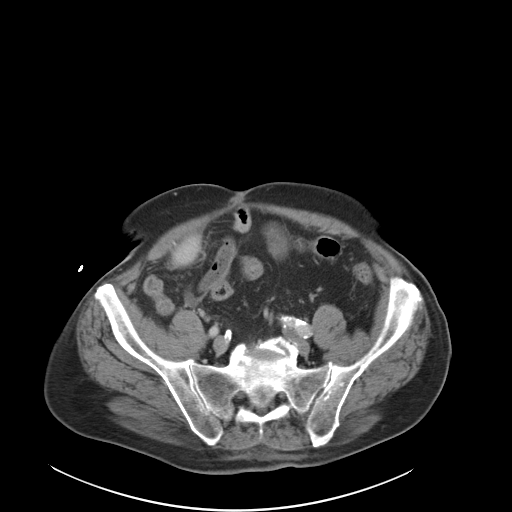
[im 45/95  soft-tissue]
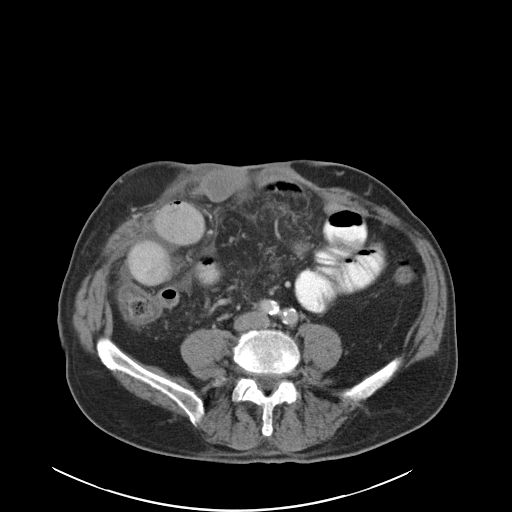
[im 50/95  soft-tissue]
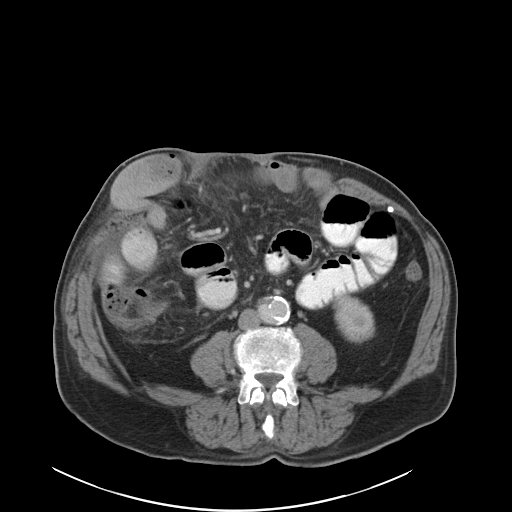
[im 60/95  soft-tissue]
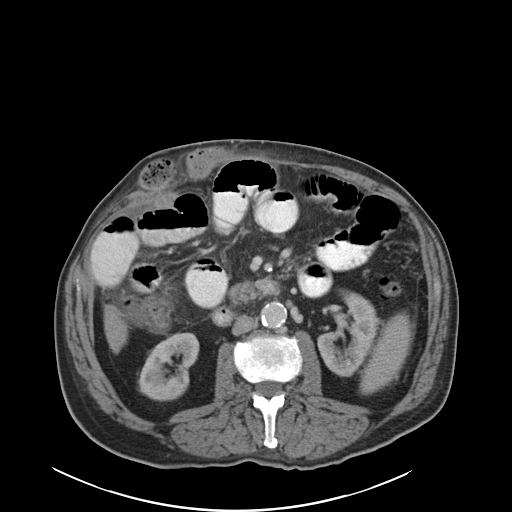
[im 65/95  soft-tissue]
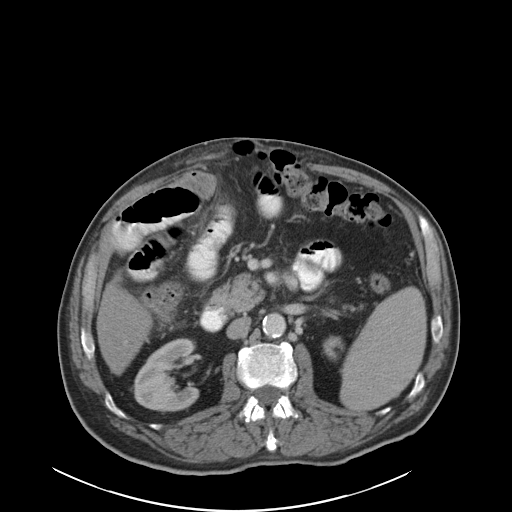
[im 65/95  bone]
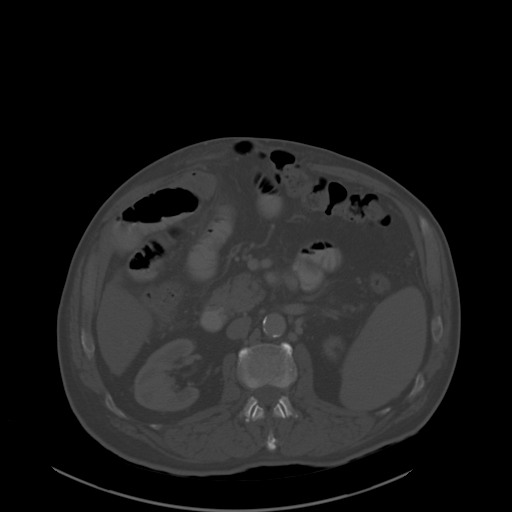
[im 75/95  soft-tissue]
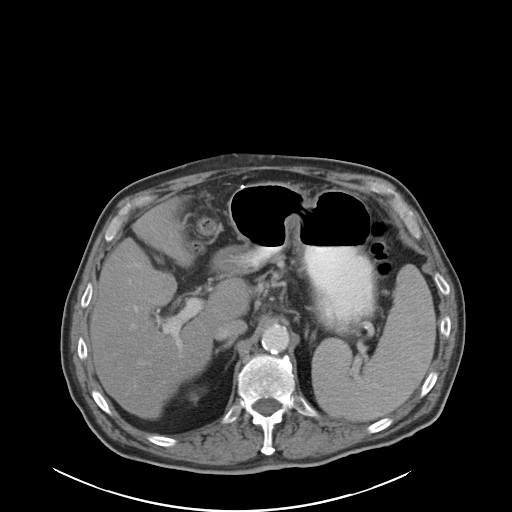
[im 75/95  lung]
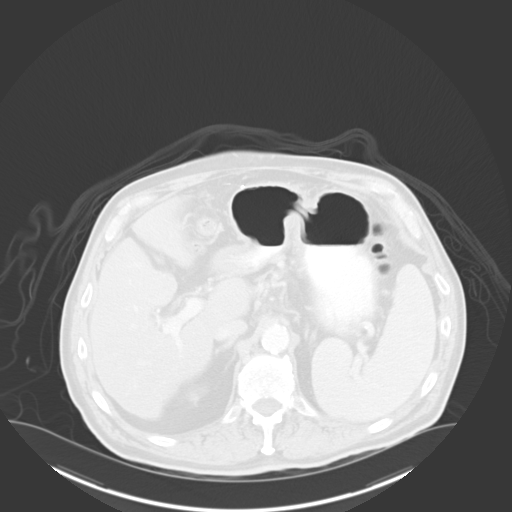
[im 80/95  soft-tissue]
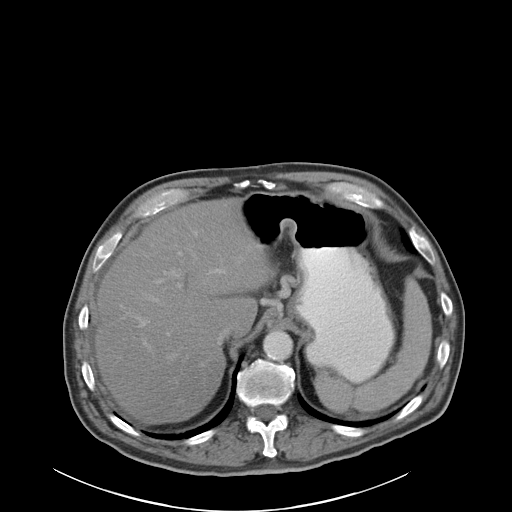
[im 80/95  lung]
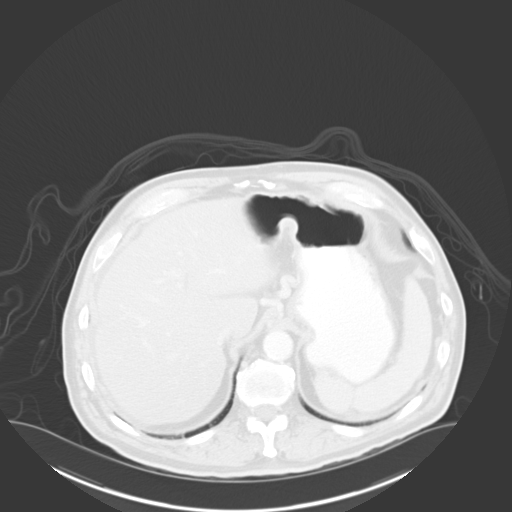
[im 85/95  lung]
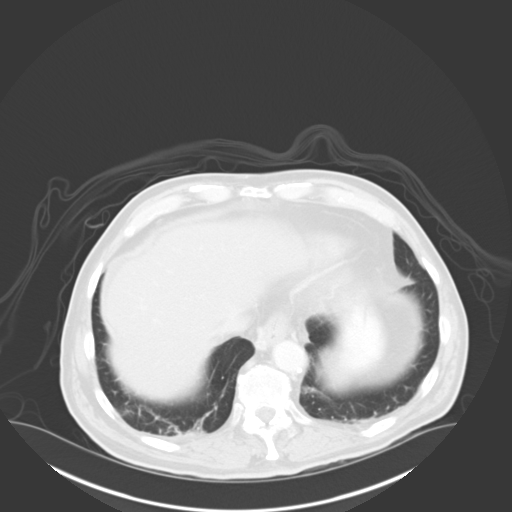
[im 90/95  soft-tissue]
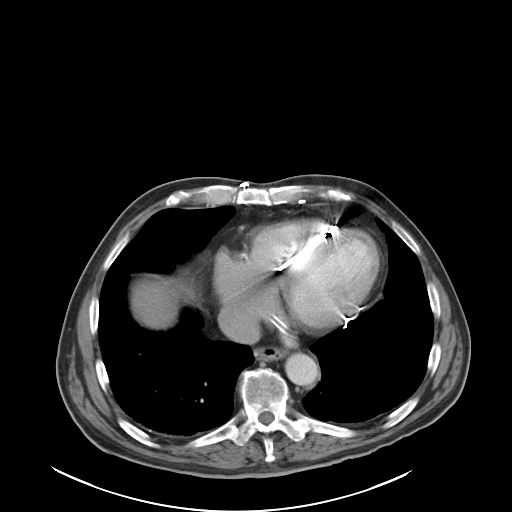
[im 90/95  lung]
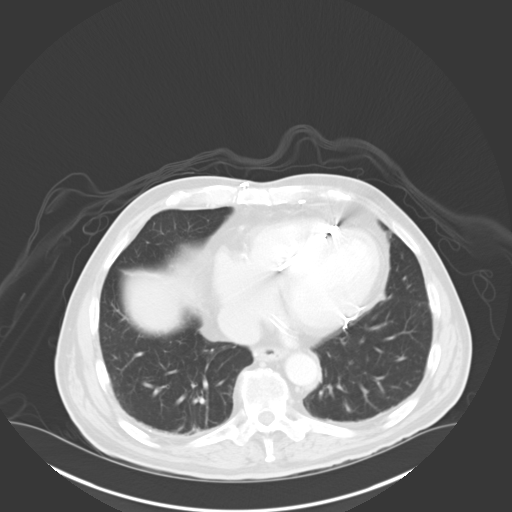

[13 of 32 positions shown; findings below may reference images not displayed]

FINDINGS: Early or partial small bowel obstruction pattern is
present.  Proximal small bowel loops are dilated.  Contrast extends
through small bowel to a right abdominal hernia.  Small bowel is
herniated through the defect.  Fecal like material is inspissated
in the small bowel at the level of the obstruction.  See image #38.
Small bowel loops traverse back into the peritoneal space then into
an additional hernia.  There is probably a level of obstruction in
both of these hernia sacs.  Scar tissue surrounds the area.  The
hernia is secondary to a defect in the anterior abdominal wall
likely from multiple prior surgeries.

There is a dilated bowel loop in the lower mid abdomen on image 56
likely is a result of bowel anastomosis.  Distal small bowel and
ileum are decompressed.  Ascending colon protrudes through the
hernia defect but there is no evidence of colonic obstruction.

Mild edema involving the ascending colon as well as in the adjacent
mesentery is likely related to the dilated small bowel loops rather
than primary inflammation of the colon.  Small amount of free fluid
is seen anterior to small bowel loops which are dilated on image
36.

There is no free intraperitoneal gas.

Post cholecystectomy.  Small amount of free fluid around the liver.
Diffuse hepatic steatosis.  Pancreas, adrenal glands are within
normal limits. Tiny hypodensity in the posterior and medial spleen
on image 20 of unknown significance.  Tiny liver hypodensity on
image 14 of unknown significance.  Calcified granulomata in the
liver.  Chronic changes of the kidneys.  Benign-appearing cyst in
the upper pole of the right kidney.  Other hypodensities are too
small to characterize

Penetrating atherosclerotic ulcer in the infrarenal aorta is noted
on image 45.  Maximal aortic caliber at this level is 3.2 cm.  Mild
ectasia of the right and left common iliac artery with caliber is
of 1.8 and 1.8 cm respectively.

Portal vein is patent.  Gastroesophageal varices are identified of
unknown significance.

Bladder is minimally distended.  Prostate is within normal limits.

Right rectus femoris lipoma.
IMPRESSION: The small bowel partial or early small bowel obstruction pattern
secondary to and anterior abdominal wall hernia and wall defect as
described.  There is inflammation within the right side the abdomen
and anterior abdomen secondary to this process.

Gastroesophageal varices of unknown significance.

Chronic changes.

## 2013-05-20 ENCOUNTER — Ambulatory Visit: Payer: Medicare Other | Admitting: Physician Assistant

## 2013-05-20 ENCOUNTER — Ambulatory Visit (INDEPENDENT_AMBULATORY_CARE_PROVIDER_SITE_OTHER): Payer: Medicare Other | Admitting: Cardiology

## 2013-05-20 ENCOUNTER — Encounter: Payer: Self-pay | Admitting: Cardiology

## 2013-05-20 VITALS — BP 116/70 | HR 60 | Ht 72.0 in | Wt 214.8 lb

## 2013-05-20 DIAGNOSIS — I5022 Chronic systolic (congestive) heart failure: Secondary | ICD-10-CM

## 2013-05-20 DIAGNOSIS — I10 Essential (primary) hypertension: Secondary | ICD-10-CM

## 2013-05-20 DIAGNOSIS — I447 Left bundle-branch block, unspecified: Secondary | ICD-10-CM

## 2013-05-20 DIAGNOSIS — I2510 Atherosclerotic heart disease of native coronary artery without angina pectoris: Secondary | ICD-10-CM

## 2013-05-20 DIAGNOSIS — I739 Peripheral vascular disease, unspecified: Secondary | ICD-10-CM

## 2013-05-20 DIAGNOSIS — K922 Gastrointestinal hemorrhage, unspecified: Secondary | ICD-10-CM

## 2013-05-20 DIAGNOSIS — I779 Disorder of arteries and arterioles, unspecified: Secondary | ICD-10-CM

## 2013-05-20 DIAGNOSIS — Z7901 Long term (current) use of anticoagulants: Secondary | ICD-10-CM

## 2013-05-20 DIAGNOSIS — Z95810 Presence of automatic (implantable) cardiac defibrillator: Secondary | ICD-10-CM | POA: Diagnosis not present

## 2013-05-20 DIAGNOSIS — I2589 Other forms of chronic ischemic heart disease: Secondary | ICD-10-CM | POA: Diagnosis not present

## 2013-05-20 MED ORDER — FUROSEMIDE 40 MG PO TABS: 40.0000 mg | ORAL_TABLET | Freq: Two times a day (BID) | ORAL | Status: DC

## 2013-05-20 MED ORDER — LISINOPRIL 5 MG PO TABS: 5.0000 mg | ORAL_TABLET | Freq: Every day | ORAL | Status: DC

## 2013-05-20 NOTE — Assessment & Plan Note (Signed)
Patient has known left bundle branch block.

## 2013-05-20 NOTE — Assessment & Plan Note (Signed)
After hospitalization for GI bleed his medications for left ventricular dysfunction were reduced. He is off eplerinone. His diuretic had been stopped but has now been partially restarted. His beta blocker dose was reduced. His ACE inhibitor was stopped. With his diuretic restarted he is improving. He is still volume overloaded. I will increase his Lasix back up to 40 twice a day until his home weight is down to 202 pounds. At that time he will take 60 mg daily. We have been successful with this pattern before. I will keep him off eplerinone for now. Carvedilol dose will be kept the same. He'll be started on 5 mg of lisinopril. I will see him for early followup to be sure that he is improving with this.

## 2013-05-20 NOTE — Progress Notes (Signed)
Patient ID: [name], male   DOB: [birth year], 80 y.o.   MRN: [id number]    HPI  Patient is seen today to followup coronary disease and cardiomyopathy. I had seen him last in October, 2014. Since that time he has been admitted with a GI bleed. No obvious source was found. He was felt to be related to a high INR. His Coumadin has been restarted. With the bleed he had been dehydrated. His meds were reduced at that time. After discharge from the hospital he began to retain fluid. He was seen in the pulmonary office several weeks ago and diuretics were restarted. He is diaries and his weight is coming down but not down to baseline. He continues to have exertional shortness of breath. He did have edema with this and abdominal distention.  As part of today's evaluation I have reviewed the hospital records. I've also review the office records since his discharge from the hospital.  No Known Allergies  Current Outpatient Prescriptions  Medication Sig Dispense Refill  . azelastine (ASTELIN) 137 MCG/SPRAY nasal spray Place 1 spray into both nostrils 2 (two) times daily. Use in each nostril as directed      . carvedilol (COREG) 6.25 MG tablet Take 0.5 tablets (3.125 mg total) by mouth 2 (two) times daily with a meal.      . cholecalciferol (VITAMIN D) 1000 UNITS tablet Take 1,000 Units by mouth daily.       . Cyanocobalamin (B-12) 1000 MCG CAPS Take 1,000 mcg by mouth daily.       . fluticasone (FLONASE) 50 MCG/ACT nasal spray Place 2 sprays into both nostrils daily.      . furosemide (LASIX) 40 MG tablet Take 1 tablet (40 mg total) by mouth 2 (two) times daily.  60 tablet  0  . pantoprazole (PROTONIX) 40 MG tablet Take 1 tablet (40 mg total) by mouth daily at 12 noon.  30 tablet  0  . simvastatin (ZOCOR) 20 MG tablet Take 20 mg by mouth daily.      [name] warfarin (COUMADIN) 5 MG tablet Take 5 mg by mouth as directed.      [name] lisinopril (PRINIVIL,ZESTRIL) 5 MG tablet Take 1 tablet (5 mg total) by mouth  daily.  90 tablet  3   No current facility-administered medications for this visit.    History   Social History  . Marital Status: Married    Spouse Name: Kennyth Lose    Number of Children: 3  . 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 Smoking status: Former Smoker -- 1.00 packs/day for 20 years    Types: Cigarettes    Quit date: 01/31/1973  . Smokeless tobacco: Never Used  . Alcohol Use: Yes     Comment: occasional  . Drug Use: No  . Sexual Activity: Not on file   Other Topics Concern  . Not on file   Social History Narrative   Lives in [place], [place] with wife.     Family History  Problem Relation Age of Onset  . Heart disease Mother   . Hypertension    . Hyperlipidemia    . Tremor Sister   . Heart disease Father 69    Deceased   . Cancer Brother     Past Medical History  Diagnosis Date  . Dizziness     Positional dizziness  .  Hypertension   . Ischemic cardiomyopathy     CABG 1994 CAD catherization 1996.[name] occluded vein graft to the diagnol, other grafts patent/  catherization 2006, no PCI/ nuclear.[name] october 2011.[name] extensive scar anteroseptal and apical.. no ischemia    . CHF (congestive heart failure)     chronic systolic  . Mitral regurgitation     mild.[name] echo november 2011  EF 35%...echo.. november 2009/ ef 35%.[name] echo november 2011  . Sinus bradycardia   . Chronotropic incompetence   . Biventricular implantable cardiac defibrillator in situ     GDT CONTAK H170-MADIT-CRT-EXPLANTED 2011 implanted defibrillator- Guid[employer] cognis, model n119-2001 Dr. Caryl Comes (11/28/2009)  . LBBB (left bundle branch block)   . Hyperlipemia   . GERD (gastroesophageal reflux disease)   . Colonic polyp 2010    pathology not clear.   . Crohn's disease   . Acute vascular insufficiency of intestine     Mesenteric embolus...surgery 2003  . Ventral hernia   . Hypertrophy of prostate with urinary  obstruction and other lower urinary tract symptoms (LUTS)   . Degenerative joint disease   . Polymyalgia rheumatica   . Tremor   . Anxiety   . Shingles   . Vitamin B12 deficiency   . CAD (coronary artery disease)     CABG 1994  /   catheterization 1996, occluded vein graft to the diagonal, other grafts patent  /   catheterization 2006, no PCI  /  nuclear, October, 2011, extensive scar anteroseptal and apical, no ischemia  . Hx of CABG     1994  . Ejection fraction     EF 35%, echo, November, 2011  //   Is 35-40%, echo,  September 23, 2011  . Warfarin anticoagulation     Coumadin therapy for mesenteric embolus 2003  . Carotid artery disease     Doppler, February,  2012, 0-39% bilateral, recommended followup 1 year  . Edema leg     Venous Dopplers 8/13: No DVT bilaterally  . Breast tenderness     Spironolactone was stopped and eplerinone started. Patient feels better  . Thrombocytopenia 2008    Past Surgical History  Procedure Laterality Date  . Coronary artery bypass graft  1994  . Laparoscopic cholecystectomy  2002  . Elap w/ superior mesenteric artery embolectomy  1/03    by Dr. Jennette Banker  . Repair of incarcerated ventral hernia and lysis of adhesions  11/03    by Dr. Betsy Pries  . Implantation of biventric cardioverter-defibrillatorr      by Dr. Lovena Le in 8/06 Guidant Rienzi 2011  . Implantation of guidant cognis device      model [id number]  . Left inguinal hernia repair with mesh  6/08    by Dr. Betsy Pries  . Prostate surgery  08/2012    by urology in [place]  . Esophagogastroduodenoscopy N/A 04/18/2013    Procedure: ESOPHAGOGASTRODUODENOSCOPY (EGD);  Surgeon: Jerene Bears, MD;  Location: [facility] ENDOSCOPY;  Service: Endoscopy;  Laterality: N/A;  . Colonoscopy N/A 04/19/2013    Procedure: COLONOSCOPY;  Surgeon: Jerene Bears, MD;  Location: [facility] ENDOSCOPY;  Service: Endoscopy;  Laterality: N/A;    Patient Active Problem List   Diagnosis Date Noted  . Acute blood loss  anemia 04/17/2013  . Acute encephalopathy 04/17/2013  . GIB (gastrointestinal bleeding) 04/17/2013  . UGIB (upper gastrointestinal bleed) 04/16/2013  . Warfarin-induced coagulopathy 04/16/2013  . Thrombocytopenia 11/30/2011  . Ejection fraction   . Edema leg   . OSA (obstructive sleep apnea) 09/16/2011  .  CAD (coronary artery disease)   . Hx of CABG   . Hypertension   . Chronic systolic heart failure   . Mitral regurgitation   . Chronotropic incompetence   . Hyperlipemia   . Crohn's disease   . Ventral hernia   . Tremor   . Warfarin anticoagulation   . Carotid artery disease   . 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 IMPLANTABLE DEFIBRILLATOR, GDT CONTAK H170-MADIT-CRT 12/09/2009  . HYPERTROPHY PROSTATE W/UR OBST & OTH LUTS 03/31/2009  . VITAMIN B12 DEFICIENCY 11/06/2008  . VITAMIN D DEFICIENCY 11/06/2008  . CARDIOMYOPATHY, ISCHEMIC  s/p CABG   09/15/2008  . LBBB 06/25/2008  . FATIGUE 06/25/2008  . COLONIC POLYPS 03/23/2007  . POLYMYALGIA RHEUMATICA 03/23/2007  . ANXIETY 02/21/2007  . GERD 02/21/2007  . DEGENERATIVE JOINT DISEASE 02/21/2007    ROS   Patient denies fever, chills, headache, rash, change in vision, change in hearing, chest pain, cough, nausea or vomiting, urinary symptoms. All other systems are reviewed and are negative.  PHYSICAL EXAM   The patient is oriented to person time and place. Affect is normal. There is no jugulovenous distention. There are few basilar rales. Cardiac exam reveals S1 and S2. The abdomen is mildly distended but not tense. There is 1+ peripheral edema. There no musculoskeletal deformities. There are no skin rashes.  Filed Vitals:   05/20/13 1113  BP: 116/70  Pulse: 60  Height: 6' (1.829 m)  Weight: 214 lb 12.8 oz (97.433 kg)     ASSESSMENT & PLAN

## 2013-05-20 NOTE — Assessment & Plan Note (Signed)
His meds had to be held and decreased because of his episode of GI bleeding. We are now able to restart many of his medicines. He has diuresis but he is still volume overloaded with significant symptoms and some increased weight. His diuretic dose is being re\re increased carefully along with the resumption of some of his other medicines. I'll see him back for early followup.  As part of today's evaluation I spent greater than 25 minutes with is total care. More than half of this time was with direct contact with the patient and his family member about the multitude of issues.

## 2013-05-20 NOTE — Assessment & Plan Note (Signed)
His Coumadin anticoagulation is extremely important. His INR was high and he recently had a GI bleed but his Coumadin was restarted. He had a significant mesenteric embolus in 2003.

## 2013-05-20 NOTE — Assessment & Plan Note (Signed)
His nuclear scan was done in 2011. There was no ischemia. I've chosen not to press for ischemic evaluation at this time.

## 2013-05-20 NOTE — Assessment & Plan Note (Signed)
His most recent hemoglobin was stable after his GI bleed.

## 2013-05-20 NOTE — Assessment & Plan Note (Signed)
The patient's ICD is monitored. He had a unit that was replaced in 2011.

## 2013-05-20 NOTE — Assessment & Plan Note (Signed)
Blood pressure is controlled on his current medicines.

## 2013-05-20 NOTE — Assessment & Plan Note (Signed)
He has moderate carotid disease with mild progression in March, 2015. One-year followup is planned.

## 2013-05-20 NOTE — Patient Instructions (Signed)
**Note De-Identified  Obfuscation** Your physician has recommended you make the following change in your medication: resume Lisinopril 5 mg daily and increase Furosemide to 40 mg twice daily until your weight is at 202 lbs. on your home scales then decrease back down to 60 mg (1 and 1/2 tablet) daily.  Your physician recommends that you schedule a follow-up appointment in: 2 to 3 weeks

## 2013-05-21 ENCOUNTER — Other Ambulatory Visit (HOSPITAL_COMMUNITY): Payer: Self-pay | Admitting: Pulmonary Disease

## 2013-05-23 ENCOUNTER — Ambulatory Visit (INDEPENDENT_AMBULATORY_CARE_PROVIDER_SITE_OTHER): Payer: Medicare Other | Admitting: *Deleted

## 2013-05-23 ENCOUNTER — Encounter: Payer: Self-pay | Admitting: Internal Medicine

## 2013-05-23 DIAGNOSIS — I2589 Other forms of chronic ischemic heart disease: Secondary | ICD-10-CM | POA: Diagnosis not present

## 2013-05-23 LAB — MDC_IDC_ENUM_SESS_TYPE_REMOTE
Brady Statistic RA Percent Paced: 70 %
Brady Statistic RV Percent Paced: 95 %
Lead Channel Impedance Value: 360 Ohm
Lead Channel Impedance Value: 411 Ohm
Lead Channel Impedance Value: 480 Ohm
Lead Channel Sensing Intrinsic Amplitude: 2.7 mV
Lead Channel Sensing Intrinsic Amplitude: 20.7 mV
Lead Channel Sensing Intrinsic Amplitude: 25 mV
Lead Channel Setting Pacing Amplitude: 2.4 V
Lead Channel Setting Pacing Pulse Width: 0.4 ms
MDC IDC MSMT BATTERY REMAINING LONGEVITY: 84 mo
MDC IDC PG SERIAL: 490477
MDC IDC SET LEADCHNL LV PACING AMPLITUDE: 2 V
MDC IDC SET LEADCHNL LV PACING PULSEWIDTH: 0.8 ms
MDC IDC SET LEADCHNL RA PACING AMPLITUDE: 2 V
MDC IDC SET ZONE DETECTION INTERVAL: 285.7 ms
MDC IDC SET ZONE DETECTION INTERVAL: 333.3 ms

## 2013-05-27 ENCOUNTER — Other Ambulatory Visit: Payer: Self-pay

## 2013-05-27 ENCOUNTER — Ambulatory Visit (INDEPENDENT_AMBULATORY_CARE_PROVIDER_SITE_OTHER): Payer: Medicare Other | Admitting: *Deleted

## 2013-05-27 ENCOUNTER — Other Ambulatory Visit (INDEPENDENT_AMBULATORY_CARE_PROVIDER_SITE_OTHER): Payer: Medicare Other

## 2013-05-27 ENCOUNTER — Encounter: Payer: Self-pay | Admitting: Physician Assistant

## 2013-05-27 ENCOUNTER — Ambulatory Visit: Payer: Medicare Other | Admitting: Physician Assistant

## 2013-05-27 VITALS — BP 128/82 | HR 76 | Ht 72.0 in | Wt 213.0 lb

## 2013-05-27 DIAGNOSIS — D649 Anemia, unspecified: Secondary | ICD-10-CM

## 2013-05-27 DIAGNOSIS — Z7901 Long term (current) use of anticoagulants: Secondary | ICD-10-CM

## 2013-05-27 DIAGNOSIS — K55069 Acute infarction of intestine, part and extent unspecified: Secondary | ICD-10-CM

## 2013-05-27 DIAGNOSIS — Z8719 Personal history of other diseases of the digestive system: Secondary | ICD-10-CM

## 2013-05-27 DIAGNOSIS — K55059 Acute (reversible) ischemia of intestine, part and extent unspecified: Secondary | ICD-10-CM | POA: Diagnosis not present

## 2013-05-27 LAB — CBC WITH DIFFERENTIAL/PLATELET
BASOS ABS: 0 10*3/uL (ref 0.0–0.1)
Basophils Relative: 0.8 % (ref 0.0–3.0)
Eosinophils Absolute: 0.1 10*3/uL (ref 0.0–0.7)
Eosinophils Relative: 2.2 % (ref 0.0–5.0)
HEMATOCRIT: 30.9 % — AB (ref 39.0–52.0)
Hemoglobin: 9.9 g/dL — ABNORMAL LOW (ref 13.0–17.0)
Lymphocytes Relative: 27.1 % (ref 12.0–46.0)
Lymphs Abs: 1.3 10*3/uL (ref 0.7–4.0)
MCHC: 32.2 g/dL (ref 30.0–36.0)
MCV: 82.1 fl (ref 78.0–100.0)
MONO ABS: 0.7 10*3/uL (ref 0.1–1.0)
Monocytes Relative: 14.3 % — ABNORMAL HIGH (ref 3.0–12.0)
NEUTROS PCT: 55.6 % (ref 43.0–77.0)
Neutro Abs: 2.7 10*3/uL (ref 1.4–7.7)
PLATELETS: 101 10*3/uL — AB (ref 150.0–400.0)
RBC: 3.76 Mil/uL — ABNORMAL LOW (ref 4.22–5.81)
RDW: 17.8 % — ABNORMAL HIGH (ref 11.5–14.6)
WBC: 4.9 10*3/uL (ref 4.5–10.5)

## 2013-05-27 LAB — POCT INR: INR: 2.5

## 2013-05-27 MED ORDER — SIMVASTATIN 20 MG PO TABS
20.0000 mg | ORAL_TABLET | Freq: Every day | ORAL | Status: DC
Start: 2013-05-27 — End: 2013-07-06

## 2013-05-27 NOTE — Progress Notes (Addendum)
Patient ID: [name], male    DOB: 27-Jul-1931, 80 y.o.   MRN: [id number]  HPI  [name] is a very nice 80 year old white male recently known to Dr. Hilarie Fredrickson from hospital consultation. Patient was admitted with GI bleeding on 04/14/2013. Bleeding was described as melena and this was in the setting of a supratherapeutic INR in the 6 range. Patient is no prior history of GI bleeding though he does have history of Crohn's enteritis and is status post resection of a portion of the small bowel several years ago done in [place] for Crohn's. Patient underwent EGD on 04/18/2013 per Dr. Hilarie Fredrickson and this was a negative exam. There was no stricture noted. He also had colonoscopy during that admission with finding of a few small sessile polyps in the transverse colon which were not removed due to recent active bleeding and otherwise unremarkable. He did not undergo capsule endoscopy do to his multiple comorbidities and also because of prior bowel resection for Crohn's and concerns for possible capsule retention. Patient had his Coumadin held short-term and is now back on Coumadin at a lower dose. He is to have his INR checked today. Hemoglobin was 10.1 on March 30. Both he and his wife deny any problems with melena or hematochezia since discharge. His bowel movements have been normal. His appetite has been fair he denies any nausea or vomiting. He describes occasional jabbing pain across his abdomen but says he also gets jabbing pains in his arms and legs periodically and figures this is due to some sort of nerve irritation. Patient has history of an ischemic cardiomyopathy with an ejection fraction of about 35%. He is status post defibrillator placement and is maintained on chronic Coumadin. He also has history of left bundle branch block, and coronary artery disease with remote CABG. He and his wife state that he has not had any active problems with Crohn since he had the small bowel resection 6 or 7  years ago. He has not been on any maintenance medication.    Review of Systems  Constitutional: Positive for fatigue.  HENT: Negative.   Eyes: Negative.   Respiratory: Negative.   Cardiovascular: Negative.   Gastrointestinal: Negative.   Endocrine: Negative.   Genitourinary: Negative.   Musculoskeletal: Positive for arthralgias and myalgias.  Skin: Negative.   Allergic/Immunologic: Negative.   Neurological: Negative.   Hematological: Negative.   Psychiatric/Behavioral: Negative.    Outpatient Prescriptions Prior to Visit  Medication Sig Dispense Refill  . azelastine (ASTELIN) 137 MCG/SPRAY nasal spray Place 1 spray into both nostrils 2 (two) times daily. Use in each nostril as directed      . carvedilol (COREG) 6.25 MG tablet Take 0.5 tablets (3.125 mg total) by mouth 2 (two) times daily with a meal.      . cholecalciferol (VITAMIN D) 1000 UNITS tablet Take 1,000 Units by mouth daily.       . Cyanocobalamin (B-12) 1000 MCG CAPS Take 1,000 mcg by mouth daily.       . fluticasone (FLONASE) 50 MCG/ACT nasal spray Place 2 sprays into both nostrils daily.      . furosemide (LASIX) 40 MG tablet Take 1 tablet (40 mg total) by mouth 2 (two) times daily.  60 tablet  0  . lisinopril (PRINIVIL,ZESTRIL) 5 MG tablet Take 1 tablet (5 mg total) by mouth daily.  90 tablet  3  . pantoprazole (PROTONIX) 40 MG tablet TAKE 1 TABLET EVERY DAY AT 12 NOON  30 tablet  5  . warfarin (COUMADIN) 5 MG tablet Take 5 mg by mouth as directed.      . simvastatin (ZOCOR) 20 MG tablet Take 20 mg by mouth daily.       No facility-administered medications prior to visit.   No Known Allergies Patient Active Problem List   Diagnosis Date Noted  . Acute blood loss anemia 04/17/2013  . Acute encephalopathy 04/17/2013  . GIB (gastrointestinal bleeding) 04/17/2013  . UGIB (upper gastrointestinal bleed) 04/16/2013  . Warfarin-induced coagulopathy 04/16/2013  . Thrombocytopenia 11/30/2011  . Ejection fraction   .  Edema leg   . OSA (obstructive sleep apnea) 09/16/2011  . CAD (coronary artery disease)   . Hx of CABG   . Hypertension   . Chronic systolic heart failure   . Mitral regurgitation   . Chronotropic incompetence   . Hyperlipemia   . Crohn's disease   . Ventral hernia   . Tremor   . Warfarin anticoagulation   . Carotid artery disease   . 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 IMPLANTABLE DEFIBRILLATOR, GDT CONTAK H170-MADIT-CRT 12/09/2009  . HYPERTROPHY PROSTATE W/UR OBST & OTH LUTS 03/31/2009  . VITAMIN B12 DEFICIENCY 11/06/2008  . VITAMIN D DEFICIENCY 11/06/2008  . CARDIOMYOPATHY, ISCHEMIC  s/p CABG   09/15/2008  . LBBB 06/25/2008  . FATIGUE 06/25/2008  . COLONIC POLYPS 03/23/2007  . POLYMYALGIA RHEUMATICA 03/23/2007  . ANXIETY 02/21/2007  . GERD 02/21/2007  . DEGENERATIVE JOINT DISEASE 02/21/2007   History  Substance Use Topics  . Smoking status: Former Smoker -- 1.00 packs/day for 20 years    Types: Cigarettes    Quit date: 01/31/1973  . Smokeless tobacco: Never Used  . Alcohol Use: Yes     Comment: occasional   family history includes Cancer in his brother; Heart disease in his mother; Heart disease (age of onset: 61) in his father; Hyperlipidemia in an other family member; Hypertension in an other family member; Tremor in his sister.     Objective:   Physical Exam well-developed elderly white male in no acute distress, accompanied by his wife blood pressure 128/82 pulse 76 height 6 foot weight 213. HEENT ;nontraumatic normocephalic EOMI PERRLA sclera anicteric, Supple; no JVD, Cardiovascular; regular rate and rhythm with I7-O6 is a soft systolic murmur also has a defibrillator in the left chest wall, Pulmonary; clear bilaterally, Abdomen; full feeling no definite fluid wave nontender no palpable mass or hepatosplenomegaly bowel sounds present, Rectal; exam not done, Extremities; 2+ edema bilaterally to the upper shin, Psych; mood and affect normal and appropriate          Assessment & Plan:  #41 80 year old white male with an ischemic cardiomyopathy status post defibrillator placement and on chronic Coumadin with recent admission secondary to GI bleed in the setting of a supratherapeutic INR. No definite source found on EGD or colonoscopy. Patient has been stable since discharge with no evidence for bleeding and Coumadin was resumed at a lower dose. #2 history of Crohn's enteritis status post remote small bowel resection-no capsule endoscopy planned due to concerns of possible retention though small bowel source of bleeding was not ruled out. #3 anemia secondary to recent blood loss #4 coronary artery disease status post CABG #5 history of polymyalgia rheumatica #6 remote history of mesenteric thrombosis #7 hypertension  #8 small colon polyps- not removed at time of recent colonoscopy #9 GERD Plan; patient will sign a release today so that we may obtain his records from his gastroenterologist in [place] regarding  his Crohn's history Check CBC today Followup with Dr. Hilarie Fredrickson as needed-no further GI workup planned at this time unless patient would have recurrent GI bleeding.  Addendum: Reviewed and agree with management. Jerene Bears, MD

## 2013-05-27 NOTE — Patient Instructions (Signed)
Go to the Coumadin clinic , [facility], and they can draw the labs for  CBC ( Complete blood count ) also when they draw the [place]thombin time.

## 2013-06-03 NOTE — Progress Notes (Signed)
ICD remote 

## 2013-06-05 ENCOUNTER — Encounter: Payer: Self-pay | Admitting: Cardiology

## 2013-06-07 ENCOUNTER — Telehealth: Payer: Self-pay | Admitting: Cardiology

## 2013-06-07 ENCOUNTER — Encounter: Payer: Self-pay | Admitting: Cardiology

## 2013-06-07 ENCOUNTER — Ambulatory Visit (INDEPENDENT_AMBULATORY_CARE_PROVIDER_SITE_OTHER): Payer: Medicare Other | Admitting: Cardiology

## 2013-06-07 ENCOUNTER — Ambulatory Visit (INDEPENDENT_AMBULATORY_CARE_PROVIDER_SITE_OTHER)
Admission: RE | Admit: 2013-06-07 | Discharge: 2013-06-07 | Disposition: A | Discharge status: Home / Self Care | Payer: Medicare Other | Source: Ambulatory Visit | Attending: Cardiology | Admitting: Cardiology

## 2013-06-07 ENCOUNTER — Ambulatory Visit (INDEPENDENT_AMBULATORY_CARE_PROVIDER_SITE_OTHER): Payer: Medicare Other

## 2013-06-07 ENCOUNTER — Telehealth: Payer: Self-pay

## 2013-06-07 VITALS — BP 133/58 | HR 60 | Ht 72.0 in | Wt 212.0 lb

## 2013-06-07 DIAGNOSIS — K55069 Acute infarction of intestine, part and extent unspecified: Secondary | ICD-10-CM

## 2013-06-07 DIAGNOSIS — I5022 Chronic systolic (congestive) heart failure: Secondary | ICD-10-CM

## 2013-06-07 DIAGNOSIS — M25572 Pain in left ankle and joints of left foot: Secondary | ICD-10-CM | POA: Insufficient documentation

## 2013-06-07 DIAGNOSIS — M7989 Other specified soft tissue disorders: Secondary | ICD-10-CM | POA: Diagnosis not present

## 2013-06-07 DIAGNOSIS — I2510 Atherosclerotic heart disease of native coronary artery without angina pectoris: Secondary | ICD-10-CM | POA: Diagnosis not present

## 2013-06-07 DIAGNOSIS — I2589 Other forms of chronic ischemic heart disease: Secondary | ICD-10-CM

## 2013-06-07 DIAGNOSIS — M25579 Pain in unspecified ankle and joints of unspecified foot: Secondary | ICD-10-CM | POA: Diagnosis not present

## 2013-06-07 DIAGNOSIS — K55059 Acute (reversible) ischemia of intestine, part and extent unspecified: Secondary | ICD-10-CM | POA: Diagnosis not present

## 2013-06-07 LAB — BASIC METABOLIC PANEL
BUN: 9 mg/dL (ref 6–23)
CALCIUM: 9.2 mg/dL (ref 8.4–10.5)
CO2: 32 meq/L (ref 19–32)
Chloride: 103 mEq/L (ref 96–112)
Creatinine, Ser: 1.1 mg/dL (ref 0.4–1.5)
GFR: 72.23 mL/min (ref >60.00)
Glucose, Bld: 86 mg/dL (ref 70–99)
Potassium: 3.5 mEq/L (ref 3.5–5.1)
Sodium: 140 mEq/L (ref 135–145)

## 2013-06-07 LAB — POCT INR: INR: 2.7

## 2013-06-07 MED ORDER — FUROSEMIDE 40 MG PO TABS: 80.0000 mg | ORAL_TABLET | Freq: Two times a day (BID) | ORAL | Status: DC

## 2013-06-07 MED ORDER — POTASSIUM CHLORIDE CRYS ER 20 MEQ PO TBCR: 20.0000 meq | EXTENDED_RELEASE_TABLET | Freq: Every day | ORAL | Status: DC

## 2013-06-07 NOTE — Telephone Encounter (Signed)
New problem   Pt want to know results of xray he had done today. Please call pt.

## 2013-06-07 NOTE — Assessment & Plan Note (Signed)
When I saw him May 20, 2013 I hope that we would be able to get his weight down into the range of 202 at home. His weight went down from 210-208 at home. He still has significant edema. Chemistry will be checked today. I will increase his Lasix to 80 mg twice a day. I will see him back for early followup.

## 2013-06-07 NOTE — Telephone Encounter (Signed)
**Note De-Identified  Obfuscation** The pts wife is advised. See phone note prior to this one.

## 2013-06-07 NOTE — Progress Notes (Signed)
Patient ID: [name], male   DOB: October 04, 1931, 80 y.o.   MRN: [id number]    HPI  Patient is seen today to followup his coronary disease and cardiomyopathy. When I saw him last I pushed his diuretic dose a little higher. He is down 2 pounds. He continues to have significant edema. This is limiting him. He is not eating extra salt or drinking extra fluids.  He's also having difficulty with pain in his ankle.  No Known Allergies  Current Outpatient Prescriptions  Medication Sig Dispense Refill  . azelastine (ASTELIN) 137 MCG/SPRAY nasal spray Place 1 spray into both nostrils 2 (two) times daily. Use in each nostril as directed      . carvedilol (COREG) 6.25 MG tablet Take 0.5 tablets (3.125 mg total) by mouth 2 (two) times daily with a meal.      . cholecalciferol (VITAMIN D) 1000 UNITS tablet Take 1,000 Units by mouth daily.       . Cyanocobalamin (B-12) 1000 MCG CAPS Take 1,000 mcg by mouth daily.       . fluticasone (FLONASE) 50 MCG/ACT nasal spray Place 2 sprays into both nostrils daily.      . furosemide (LASIX) 40 MG tablet Take 1 tablet (40 mg total) by mouth 2 (two) times daily.  60 tablet  0  . lisinopril (PRINIVIL,ZESTRIL) 5 MG tablet Take 1 tablet (5 mg total) by mouth daily.  90 tablet  3  . pantoprazole (PROTONIX) 40 MG tablet TAKE 1 TABLET EVERY DAY AT 12 NOON  30 tablet  5  . simvastatin (ZOCOR) 20 MG tablet Take 1 tablet (20 mg total) by mouth daily.  90 tablet  1  . warfarin (COUMADIN) 5 MG tablet Take 5 mg by mouth as directed.       No current facility-administered medications for this visit.    History   Social History  . Marital Status: Married    Spouse Name: Kennyth Lose    Number of Children: 3  . 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 Smoking status: Former Smoker -- 1.00 packs/day for 20 years    Types: Cigarettes    Quit date: 01/31/1973  . Smokeless tobacco: Never Used   . Alcohol Use: Yes     Comment: occasional  . Drug Use: No  . Sexual Activity: Not on file   Other Topics Concern  . Not on file   Social History Narrative   Lives in [place], [place] with wife.     Family History  Problem Relation Age of Onset  . Heart disease Mother   . Hypertension    . Hyperlipidemia    . Tremor Sister   . Heart disease Father 87    Deceased   . Cancer Brother     Past Medical History  Diagnosis Date  . Dizziness     Positional dizziness  . Hypertension   . Ischemic cardiomyopathy     CABG 1994 CAD catherization 1996.[name] occluded vein graft to the diagnol, other grafts patent/  catherization 2006, no PCI/ nuclear.[name] october 2011.[name] extensive scar anteroseptal and apical.. no ischemia    . CHF (congestive heart failure)     chronic systolic  . Mitral regurgitation     mild.[name] echo november 2011  EF 35%...echo.. november 2009/ ef 35%.[name] echo november 2011  . Sinus bradycardia   . Chronotropic  incompetence   . Biventricular implantable cardiac defibrillator in situ     GDT CONTAK H170-MADIT-CRT-EXPLANTED 2011 implanted defibrillator- [employer] cognis, model n119-2001 Dr. Caryl Comes (11/28/2009)  . LBBB (left bundle branch block)   . Hyperlipemia   . GERD (gastroesophageal reflux disease)   . Colonic polyp 2010    pathology not clear.   . Crohn's disease   . Acute vascular insufficiency of intestine     Mesenteric embolus...surgery 2003  . Ventral hernia   . Hypertrophy of prostate with urinary obstruction and other lower urinary tract symptoms (LUTS)   . Degenerative joint disease   . Polymyalgia rheumatica   . Tremor   . Anxiety   . Shingles   . Vitamin B12 deficiency   . CAD (coronary artery disease)     CABG 1994  /   catheterization 1996, occluded vein graft to the diagonal, other grafts patent  /   catheterization 2006, no PCI  /  nuclear, October, 2011, extensive scar anteroseptal and apical, no ischemia  . Hx of CABG     1994  . Ejection fraction       EF 35%, echo, November, 2011  //   Is 35-40%, echo,  September 23, 2011  . Warfarin anticoagulation     Coumadin therapy for mesenteric embolus 2003  . Carotid artery disease     Doppler, February,  2012, 0-39% bilateral, recommended followup 1 year  . Edema leg     Venous Dopplers 8/13: No DVT bilaterally  . Breast tenderness     Spironolactone was stopped and eplerinone started. Patient feels better  . Thrombocytopenia 2008    Past Surgical History  Procedure Laterality Date  . Coronary artery bypass graft  1994  . Laparoscopic cholecystectomy  2002  . Elap w/ superior mesenteric artery embolectomy  1/03    by Dr. Jennette Banker  . Repair of incarcerated ventral hernia and lysis of adhesions  11/03    by Dr. Betsy Pries  . Implantation of biventric cardioverter-defibrillatorr      by Dr. Lovena Le in 8/06 [employer] [place] 2011  . Implantation of guidant cognis device      model [id number]  . Left inguinal hernia repair with mesh  6/08    by Dr. Betsy Pries  . Prostate surgery  08/2012    by urology in [place]  . Esophagogastroduodenoscopy N/A 04/18/2013    Procedure: ESOPHAGOGASTRODUODENOSCOPY (EGD);  Surgeon: Jerene Bears, MD;  Location: [facility] ENDOSCOPY;  Service: Endoscopy;  Laterality: N/A;  . Colonoscopy N/A 04/19/2013    Procedure: COLONOSCOPY;  Surgeon: Jerene Bears, MD;  Location: [facility] ENDOSCOPY;  Service: Endoscopy;  Laterality: N/A;    Patient Active Problem List   Diagnosis Date Noted  . Acute blood loss anemia 04/17/2013  . Acute encephalopathy 04/17/2013  . GIB (gastrointestinal bleeding) 04/17/2013  . UGIB (upper gastrointestinal bleed) 04/16/2013  . Warfarin-induced coagulopathy 04/16/2013  . Thrombocytopenia 11/30/2011  . Ejection fraction   . Edema leg   . OSA (obstructive sleep apnea) 09/16/2011  . CAD (coronary artery disease)   . Hx of CABG   . Hypertension   . Chronic systolic heart failure   . Mitral regurgitation   . Chronotropic incompetence   .  Hyperlipemia   . Crohn's disease   . Ventral hernia   . Tremor   . Warfarin anticoagulation   . Carotid artery disease   . 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 IMPLANTABLE DEFIBRILLATOR, GDT CONTAK H170-MADIT-CRT 12/09/2009  . HYPERTROPHY PROSTATE W/UR OBST &  OTH LUTS 03/31/2009  . VITAMIN B12 DEFICIENCY 11/06/2008  . VITAMIN D DEFICIENCY 11/06/2008  . CARDIOMYOPATHY, ISCHEMIC  s/p CABG   09/15/2008  . LBBB 06/25/2008  . FATIGUE 06/25/2008  . COLONIC POLYPS 03/23/2007  . POLYMYALGIA RHEUMATICA 03/23/2007  . ANXIETY 02/21/2007  . GERD 02/21/2007  . DEGENERATIVE JOINT DISEASE 02/21/2007    ROS   Patient denies fever, chills, headache, sweats, rash, change in vision, change in hearing, chest pain, cough, nausea vomiting, urinary symptoms. All other systems are reviewed and are negative.  PHYSICAL EXAM  Patient is here with his wife. He is oriented to person time and place. Affect is normal. He speaks slowly. This is his normal speech pattern. Head is atraumatic. Sclera and conjunctiva are normal. There is no jugulovenous distention. Lungs reveal a few scattered rhonchi. There is no respiratory distress. Cardiac exam reveals S1 and S2. Abdomen is protuberant. He does have 1-2+ bilateral peripheral edema. There no musculoskeletal deformities. There are no skin rashes.  Filed Vitals:   06/07/13 1112  BP: 133/58  Pulse: 60  Height: 6' (1.829 m)  Weight: 212 lb (96.163 kg)     ASSESSMENT & PLAN

## 2013-06-07 NOTE — Assessment & Plan Note (Signed)
When I saw him last May 20, 2013, I was trying to restart some of the medications that had to be held during his hospitalization with a GI bleed. I put him back on a small dose of lisinopril. I increased his Lasix dose slightly. I am adjusting his Lasix further today. No other medication changes will be made.

## 2013-06-07 NOTE — Patient Instructions (Signed)
Your physician has recommended you make the following change in your medication: increase Lasix to 80 mg twice daily  Your physician recommends that you return for lab work in: today  Your physician recommends that you schedule a follow-up appointment in: 10 days  Dr [name] has ordered a left ankle x-ray due to the pain in your left ankle. This will be done at [facility] on [address].  Please elevate your feet as often as possible while not active.

## 2013-06-07 NOTE — Telephone Encounter (Signed)
**Note De-Identified  Obfuscation** The pts wife is advised that the pts left ankle x-ray showed that he does not have a broken bone but there is swelling and that the pt should f/u with his PCP. Also, I advised her that due to the pts potassium level Dr [name] recommends that the pt start taking Potassium 20 meq daily, she verbalized understanding and agrees with the plan.

## 2013-06-07 NOTE — Assessment & Plan Note (Signed)
Coronary disease is stable. No change in therapy.

## 2013-06-07 NOTE — Assessment & Plan Note (Signed)
The patient is having left ankle pain. I will be encouraging him to follow up with his primary team. However we will order an x-ray of his left ankle. This is a new problem for me today.

## 2013-06-10 ENCOUNTER — Ambulatory Visit (INDEPENDENT_AMBULATORY_CARE_PROVIDER_SITE_OTHER): Payer: Medicare Other | Admitting: Family Medicine

## 2013-06-10 ENCOUNTER — Encounter: Payer: Self-pay | Admitting: Family Medicine

## 2013-06-10 VITALS — BP 110/62 | HR 67 | Temp 98.6°F | Ht 72.0 in | Wt 213.1 lb

## 2013-06-10 DIAGNOSIS — I2589 Other forms of chronic ischemic heart disease: Secondary | ICD-10-CM | POA: Diagnosis not present

## 2013-06-10 DIAGNOSIS — D649 Anemia, unspecified: Secondary | ICD-10-CM

## 2013-06-10 DIAGNOSIS — K219 Gastro-esophageal reflux disease without esophagitis: Secondary | ICD-10-CM

## 2013-06-10 DIAGNOSIS — R609 Edema, unspecified: Secondary | ICD-10-CM

## 2013-06-10 DIAGNOSIS — I10 Essential (primary) hypertension: Secondary | ICD-10-CM

## 2013-06-10 DIAGNOSIS — R52 Pain, unspecified: Secondary | ICD-10-CM | POA: Diagnosis not present

## 2013-06-10 DIAGNOSIS — D62 Acute posthemorrhagic anemia: Secondary | ICD-10-CM

## 2013-06-10 DIAGNOSIS — E8809 Other disorders of plasma-protein metabolism, not elsewhere classified: Secondary | ICD-10-CM | POA: Diagnosis not present

## 2013-06-10 DIAGNOSIS — D696 Thrombocytopenia, unspecified: Secondary | ICD-10-CM

## 2013-06-10 DIAGNOSIS — R600 Localized edema: Secondary | ICD-10-CM

## 2013-06-10 LAB — COMPREHENSIVE METABOLIC PANEL
ALT: 11 U/L (ref 0–53)
AST: 22 U/L (ref 0–37)
Albumin: 3.8 g/dL (ref 3.5–5.2)
Alkaline Phosphatase: 76 U/L (ref 39–117)
BUN: 9 mg/dL (ref 6–23)
CALCIUM: 9.4 mg/dL (ref 8.4–10.5)
CO2: 32 meq/L (ref 19–32)
CREATININE: 1.09 mg/dL (ref 0.50–1.35)
Chloride: 98 mEq/L (ref 96–112)
GLUCOSE: 124 mg/dL — AB (ref 70–99)
Potassium: 3.5 mEq/L (ref 3.5–5.3)
Sodium: 136 mEq/L (ref 135–145)
Total Bilirubin: 2 mg/dL — ABNORMAL HIGH (ref 0.2–1.2)
Total Protein: 6.3 g/dL (ref 6.0–8.3)

## 2013-06-10 LAB — CBC
HEMATOCRIT: 32.5 % — AB (ref 39.0–52.0)
HEMOGLOBIN: 10.4 g/dL — AB (ref 13.0–17.0)
MCH: 24.7 pg — ABNORMAL LOW (ref 26.0–34.0)
MCHC: 32 g/dL (ref 30.0–36.0)
MCV: 77.2 fL — ABNORMAL LOW (ref 78.0–100.0)
Platelets: 93 10*3/uL — ABNORMAL LOW (ref 150–400)
RBC: 4.21 MIL/uL — AB (ref 4.22–5.81)
RDW: 17.1 % — AB (ref 11.5–15.5)
WBC: 5.5 10*3/uL (ref 4.0–10.5)

## 2013-06-10 MED ORDER — TRAMADOL HCL 50 MG PO TABS: 50.0000 mg | ORAL_TABLET | Freq: Three times a day (TID) | ORAL | Status: DC | PRN

## 2013-06-10 NOTE — Progress Notes (Signed)
Pre visit review using our clinic review tool, if applicable. No additional management support is needed unless otherwise documented below in the visit note. 

## 2013-06-10 NOTE — Progress Notes (Signed)
Patient ID: [name], male   DOB: 05/19/1931, 80 y.o.   MRN: [id number] [name] [id number] October 07, 1931 06/10/2013      Progress Note-Follow Up  Subjective  Chief Complaint  No chief complaint on file.   HPI  Patient is a 80 year old male in today for routine medical care. He is here today to establish care. His biggest concern is increased edema recently. He has swelling and discomfort around his ankles and feet. Denies any recent change in diet, illness or injury. Symptoms are bilateral. He denies any recent change and shortness of breath, chest pain, palpitations, GI or GU concerns.  Past Medical History  Diagnosis Date  . Dizziness     Positional dizziness  . Hypertension   . Ischemic cardiomyopathy     CABG 1994 CAD catherization 1996.[name] occluded vein graft to the diagnol, other grafts patent/  catherization 2006, no PCI/ nuclear.[name] october 2011.[name] extensive scar anteroseptal and apical.. no ischemia    . CHF (congestive heart failure)     chronic systolic  . Mitral regurgitation     mild.[name] echo november 2011  EF 35%...echo.. november 2009/ ef 35%.[name] echo november 2011  . Sinus bradycardia   . Chronotropic incompetence   . Biventricular implantable cardiac defibrillator in situ     GDT CONTAK H170-MADIT-CRT-EXPLANTED 2011 implanted defibrillator- [employer] cognis, model n119-2001 Dr. Caryl Comes (11/28/2009)  . LBBB (left bundle branch block)   . Hyperlipemia   . GERD (gastroesophageal reflux disease)   . Colonic polyp 2010    pathology not clear.   . Crohn's disease   . Acute vascular insufficiency of intestine     Mesenteric embolus...surgery 2003  . Ventral hernia   . Hypertrophy of prostate with urinary obstruction and other lower urinary tract symptoms (LUTS)   . Degenerative joint disease   . Polymyalgia rheumatica   . Tremor   . Anxiety   . Shingles   . Vitamin B12 deficiency   . CAD (coronary artery disease)     CABG 1994  /   catheterization 1996, occluded  vein graft to the diagonal, other grafts patent  /   catheterization 2006, no PCI  /  nuclear, October, 2011, extensive scar anteroseptal and apical, no ischemia  . Hx of CABG     1994  . Ejection fraction     EF 35%, echo, November, 2011  //   Is 35-40%, echo,  September 23, 2011  . Warfarin anticoagulation     Coumadin therapy for mesenteric embolus 2003  . Carotid artery disease     Doppler, February,  2012, 0-39% bilateral, recommended followup 1 year  . Edema leg     Venous Dopplers 8/13: No DVT bilaterally  . Breast tenderness     Spironolactone was stopped and eplerinone started. Patient feels better  . Thrombocytopenia 2008    Past Surgical History  Procedure Laterality Date  . Coronary artery bypass graft  1994  . Laparoscopic cholecystectomy  2002  . Elap w/ superior mesenteric artery embolectomy  1/03    by Dr. Jennette Banker  . Repair of incarcerated ventral hernia and lysis of adhesions  11/03    by Dr. Betsy Pries  . Implantation of biventric cardioverter-defibrillatorr      by Dr. Lovena Le in 8/06 [employer] [place] 2011  . Implantation of guidant cognis device      model [id number]  . Left inguinal hernia repair with mesh  6/08    by Dr. Betsy Pries  .  Prostate surgery  08/2012    by urology in [place]  . Esophagogastroduodenoscopy N/A 04/18/2013    Procedure: ESOPHAGOGASTRODUODENOSCOPY (EGD);  Surgeon: Jerene Bears, MD;  Location: [facility] ENDOSCOPY;  Service: Endoscopy;  Laterality: N/A;  . Colonoscopy N/A 04/19/2013    Procedure: COLONOSCOPY;  Surgeon: Jerene Bears, MD;  Location: [facility] ENDOSCOPY;  Service: Endoscopy;  Laterality: N/A;    Family History  Problem Relation Age of Onset  . Heart disease Mother   . Hypertension    . Hyperlipidemia    . Tremor Sister   . Heart disease Father 55    Deceased   . Cancer Brother     History   Social History  . Marital Status: Married    Spouse Name: Kennyth Lose    Number of Children: 3  . Years of Education: N/A    Occupational History  . 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 Smoking status: Former Smoker -- 1.00 packs/day for 20 years    Types: Cigarettes    Quit date: 01/31/1973  . Smokeless tobacco: Never Used  . Alcohol Use: Yes     Comment: occasional  . Drug Use: No  . Sexual Activity: Not on file   Other Topics Concern  . Not on file   Social History Narrative   Lives in [place], [place] with wife.     Current Outpatient Prescriptions on File Prior to Visit  Medication Sig Dispense Refill  . azelastine (ASTELIN) 137 MCG/SPRAY nasal spray Place 1 spray into both nostrils 2 (two) times daily. Use in each nostril as directed      . carvedilol (COREG) 6.25 MG tablet Take 0.5 tablets (3.125 mg total) by mouth 2 (two) times daily with a meal.      . cholecalciferol (VITAMIN D) 1000 UNITS tablet Take 1,000 Units by mouth daily.       . Cyanocobalamin (B-12) 1000 MCG CAPS Take 1,000 mcg by mouth daily.       . fluticasone (FLONASE) 50 MCG/ACT nasal spray Place 2 sprays into both nostrils daily.      . furosemide (LASIX) 40 MG tablet Take 2 tablets (80 mg total) by mouth 2 (two) times daily.  120 tablet  3  . lisinopril (PRINIVIL,ZESTRIL) 5 MG tablet Take 1 tablet (5 mg total) by mouth daily.  90 tablet  3  . pantoprazole (PROTONIX) 40 MG tablet TAKE 1 TABLET EVERY DAY AT 12 NOON  30 tablet  5  . potassium chloride SA (K-DUR,KLOR-CON) 20 MEQ tablet Take 1 tablet (20 mEq total) by mouth daily.  30 tablet  3  . simvastatin (ZOCOR) 20 MG tablet Take 1 tablet (20 mg total) by mouth daily.  90 tablet  1  . warfarin (COUMADIN) 5 MG tablet Take 5 mg by mouth as directed.       No current facility-administered medications on file prior to visit.    No Known Allergies  Review of Systems  Review of Systems  Constitutional: Negative for fever, chills and malaise/fatigue.  HENT: Negative for congestion, hearing loss and nosebleeds.   Eyes: Negative for  discharge.  Respiratory: Negative for cough, sputum production, shortness of breath and wheezing.   Cardiovascular: Negative for chest pain, palpitations and leg swelling.  Gastrointestinal: Negative for heartburn, nausea, vomiting, abdominal pain, diarrhea, constipation and blood in stool.  Genitourinary: Negative for dysuria, urgency, frequency and hematuria.  Musculoskeletal: Negative for back pain,  falls and myalgias.  Skin: Negative for rash.  Neurological: Negative for dizziness, tremors, sensory change, focal weakness, loss of consciousness, weakness and headaches.  Endo/Heme/Allergies: Negative for polydipsia. Does not bruise/bleed easily.  Psychiatric/Behavioral: Negative for depression and suicidal ideas. The patient is not nervous/anxious and does not have insomnia.     Objective  BP 110/62  Pulse 67  Temp(Src) 98.6 F (37 C) (Oral)  Ht 6' (1.829 m)  Wt 213 lb 1.3 oz (96.652 kg)  BMI 28.89 kg/m2  SpO2 95%  Physical Exam  Physical Exam  Constitutional: He is oriented to person, place, and time and well-developed, well-nourished, and in no distress. No distress.  HENT:  Head: Normocephalic and atraumatic.  Eyes: Conjunctivae are normal.  Neck: Neck supple. No thyromegaly present.  Cardiovascular: Normal rate, regular rhythm and normal heart sounds.   No murmur heard. Pulmonary/Chest: Effort normal and breath sounds normal. No respiratory distress.  Abdominal: He exhibits no distension and no mass. There is no tenderness.  Musculoskeletal: He exhibits no edema.  Neurological: He is alert and oriented to person, place, and time.  Skin: Skin is warm.  Psychiatric: Memory, affect and judgment normal.    Lab Results  Component Value Date   TSH 1.59 03/11/2013   Lab Results  Component Value Date   WBC 4.9 05/27/2013   HGB 9.9* 05/27/2013   HCT 30.9* 05/27/2013   MCV 82.1 05/27/2013   PLT 101.0* 05/27/2013   Lab Results  Component Value Date   CREATININE 1.1 06/07/2013    BUN 9 06/07/2013   NA 140 06/07/2013   K 3.5 06/07/2013   CL 103 06/07/2013   CO2 32 06/07/2013   Lab Results  Component Value Date   ALT 24 04/22/2013   AST 30 04/22/2013   ALKPHOS 65 04/22/2013   BILITOT 1.8* 04/22/2013   Lab Results  Component Value Date   CHOL 170 03/11/2013   Lab Results  Component Value Date   HDL 60.90 03/11/2013   Lab Results  Component Value Date   LDLCALC 74 03/11/2013   Lab Results  Component Value Date   TRIG 175.0* 03/11/2013   Lab Results  Component Value Date   CHOLHDL 3 03/11/2013     Assessment & Plan  Hypertension Well controlled, no changes to meds. Encouraged heart healthy diet such as the DASH diet and exercise as tolerated.   GERD Avoid offending foods, start probiotics. Do not eat large meals in late evening and consider raising head of bed.   Thrombocytopenia Stable, following with [facility].   Edema leg Furosemide routinely encouraged to minimize sodium, elevate feet use compression hose prn  Hypoalbuminemia Encouraged increased protein in diet.  Acute blood loss anemia Improving, continue the same

## 2013-06-10 NOTE — Patient Instructions (Signed)
Try Dr school's shoe inserts Elevate feet above heart 15 min twice a day Try Salon Pas cream after icing feet for roughly 10 minutes   Plantar Fasciitis Plantar fasciitis is a common condition that causes foot pain. It is soreness (inflammation) of the band of tough fibrous tissue on the bottom of the foot that runs from the heel bone (calcaneus) to the ball of the foot. The cause of this soreness may be from excessive standing, poor fitting shoes, running on hard surfaces, being overweight, having an abnormal walk, or overuse (this is common in runners) of the painful foot or feet. It is also common in aerobic exercise dancers and ballet dancers. SYMPTOMS  Most people with plantar fasciitis complain of:  Severe pain in the morning on the bottom of their foot especially when taking the first steps out of bed. This pain recedes after a few minutes of walking.  Severe pain is experienced also during walking following a long period of inactivity.  Pain is worse when walking barefoot or up stairs DIAGNOSIS   Your caregiver will diagnose this condition by examining and feeling your foot.  Special tests such as X-rays of your foot, are usually not needed. PREVENTION   Consult a sports medicine professional before beginning a new exercise program.  Walking programs offer a good workout. With walking there is a lower chance of overuse injuries common to runners. There is less impact and less jarring of the joints.  Begin all new exercise programs slowly. If problems or pain develop, decrease the amount of time or distance until you are at a comfortable level.  Wear good shoes and replace them regularly.  Stretch your foot and the heel cords at the back of the ankle (Achilles tendon) both before and after exercise.  Run or exercise on even surfaces that are not hard. For example, asphalt is better than pavement.  Do not run barefoot on hard surfaces.  If using a treadmill, vary the  incline.  Do not continue to workout if you have foot or joint problems. Seek professional help if they do not improve. HOME CARE INSTRUCTIONS   Avoid activities that cause you pain until you recover.  Use ice or cold packs on the problem or painful areas after working out.  Only take over-the-counter or prescription medicines for pain, discomfort, or fever as directed by your caregiver.  Soft shoe inserts or athletic shoes with air or gel sole cushions may be helpful.  If problems continue or become more severe, consult a sports medicine caregiver or your own health care provider. Cortisone is a potent anti-inflammatory medication that may be injected into the painful area. You can discuss this treatment with your caregiver. MAKE SURE YOU:   Understand these instructions.  Will watch your condition.  Will get help right away if you are not doing well or get worse. Document Released: 10/12/2000 Document Revised: 04/11/2011 Document Reviewed: 12/12/2007 [facility] Patient Information 2014 [place], [place].

## 2013-06-16 DIAGNOSIS — E8809 Other disorders of plasma-protein metabolism, not elsewhere classified: Secondary | ICD-10-CM | POA: Insufficient documentation

## 2013-06-16 HISTORY — DX: Other disorders of plasma-protein metabolism, not elsewhere classified: E88.09

## 2013-06-16 NOTE — Assessment & Plan Note (Signed)
Stable, following with [facility].

## 2013-06-16 NOTE — Assessment & Plan Note (Signed)
Encouraged increased protein in diet.

## 2013-06-16 NOTE — Assessment & Plan Note (Signed)
Avoid offending foods, start probiotics. Do not eat large meals in late evening and consider raising head of bed.  

## 2013-06-16 NOTE — Assessment & Plan Note (Signed)
Improving, continue the same

## 2013-06-16 NOTE — Assessment & Plan Note (Signed)
Furosemide routinely encouraged to minimize sodium, elevate feet use compression hose prn

## 2013-06-16 NOTE — Assessment & Plan Note (Signed)
Well controlled, no changes to meds. Encouraged heart healthy diet such as the DASH diet and exercise as tolerated.  °

## 2013-06-18 ENCOUNTER — Ambulatory Visit (INDEPENDENT_AMBULATORY_CARE_PROVIDER_SITE_OTHER): Payer: Medicare Other | Admitting: Cardiology

## 2013-06-18 ENCOUNTER — Encounter: Payer: Self-pay | Admitting: Cardiology

## 2013-06-18 VITALS — BP 116/64 | HR 68 | Ht 72.0 in | Wt 210.0 lb

## 2013-06-18 DIAGNOSIS — I2589 Other forms of chronic ischemic heart disease: Secondary | ICD-10-CM

## 2013-06-18 DIAGNOSIS — E876 Hypokalemia: Secondary | ICD-10-CM

## 2013-06-18 DIAGNOSIS — I5022 Chronic systolic (congestive) heart failure: Secondary | ICD-10-CM

## 2013-06-18 DIAGNOSIS — M25572 Pain in left ankle and joints of left foot: Secondary | ICD-10-CM

## 2013-06-18 DIAGNOSIS — M25579 Pain in unspecified ankle and joints of unspecified foot: Secondary | ICD-10-CM

## 2013-06-18 HISTORY — DX: Hypokalemia: E87.6

## 2013-06-18 LAB — BASIC METABOLIC PANEL
BUN: 14 mg/dL (ref 6–23)
CHLORIDE: 101 meq/L (ref 96–112)
CO2: 32 mEq/L (ref 19–32)
Calcium: 9.2 mg/dL (ref 8.4–10.5)
Creatinine, Ser: 1.1 mg/dL (ref 0.4–1.5)
GFR: 71.43 mL/min (ref >60.00)
GLUCOSE: 101 mg/dL — AB (ref 70–99)
Potassium: 3.4 mEq/L — ABNORMAL LOW (ref 3.5–5.1)
Sodium: 139 mEq/L (ref 135–145)

## 2013-06-18 MED ORDER — FUROSEMIDE 40 MG PO TABS: 80.0000 mg | ORAL_TABLET | Freq: Two times a day (BID) | ORAL | Status: DC

## 2013-06-18 NOTE — Assessment & Plan Note (Signed)
Recent x-ray revealed no fracture. He is feeling somewhat better. I will not be assessing his left ankle any further.

## 2013-06-18 NOTE — Assessment & Plan Note (Signed)
His volume status is now under better control. Chemistry level be checked today since he's been on a higher dose of diuretics.

## 2013-06-18 NOTE — Assessment & Plan Note (Signed)
I now have him back on his beta blocker and ACE inhibitor and a good dose of diuretics. I will not make any further changes at this time. I may be able to titrate his meds up further in the future.

## 2013-06-18 NOTE — Progress Notes (Signed)
Patient ID: [name], male   DOB: [birth year], 80 y.o.   MRN: [id number]    HPI  Patient is seen today to follow up as cardiomyopathy. He had a GI bleed. At that time his medicines were adjusted and he became volume overloaded as an outpatient. I agree adjust his medicines. He is diaries since his last visit. His weight on his home scale is in the range of 203 pounds. This is relatively stable for him. He had some discomfort in his left ankle. X-ray revealed no fracture. Overall he is feeling better. Labs checked on May 11 show that his hemoglobin was 10.4. Potassium was 3.5 and we have added potassium to his meds.  No Known Allergies  Current Outpatient Prescriptions  Medication Sig Dispense Refill  . azelastine (ASTELIN) 137 MCG/SPRAY nasal spray Place 1 spray into both nostrils 2 (two) times daily. Use in each nostril as directed      . carvedilol (COREG) 6.25 MG tablet Take 0.5 tablets (3.125 mg total) by mouth 2 (two) times daily with a meal.      . cholecalciferol (VITAMIN D) 1000 UNITS tablet Take 1,000 Units by mouth daily.       . Cyanocobalamin (B-12) 1000 MCG CAPS Take 1,000 mcg by mouth daily.       . fluticasone (FLONASE) 50 MCG/ACT nasal spray Place 2 sprays into both nostrils daily.      . furosemide (LASIX) 40 MG tablet Take 2 tablets (80 mg total) by mouth 2 (two) times daily.  120 tablet  3  . lisinopril (PRINIVIL,ZESTRIL) 5 MG tablet Take 1 tablet (5 mg total) by mouth daily.  90 tablet  3  . pantoprazole (PROTONIX) 40 MG tablet TAKE 1 TABLET EVERY DAY AT 12 NOON  30 tablet  5  . potassium chloride SA (K-DUR,KLOR-CON) 20 MEQ tablet Take 1 tablet (20 mEq total) by mouth daily.  30 tablet  3  . simvastatin (ZOCOR) 20 MG tablet Take 1 tablet (20 mg total) by mouth daily.  90 tablet  1  . warfarin (COUMADIN) 5 MG tablet Take 5 mg by mouth as directed.       No current facility-administered medications for this visit.    History   Social History  . Marital Status:  Married    Spouse Name: Kennyth Lose    Number of Children: 3  . 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 Smoking status: Former Smoker -- 1.00 packs/day for 20 years    Types: Cigarettes    Quit date: 01/31/1973  . Smokeless tobacco: Never Used  . Alcohol Use: Yes     Comment: occasional  . Drug Use: No  . Sexual Activity: Not on file   Other Topics Concern  . Not on file   Social History Narrative   Lives in [place], [place] with wife.     Family History  Problem Relation Age of Onset  . Heart disease Mother   . Hypertension    . Hyperlipidemia    . Tremor Sister   . Heart disease Father 54    Deceased   . Cancer Brother     Past Medical History  Diagnosis Date  . Dizziness     Positional dizziness  . Hypertension   . Ischemic cardiomyopathy     CABG 1994 CAD catherization 1996.[name] occluded vein graft to the  diagnol, other grafts patent/  catherization 2006, no PCI/ nuclear.[name] october 2011.[name] extensive scar anteroseptal and apical.. no ischemia    . CHF (congestive heart failure)     chronic systolic  . Mitral regurgitation     mild.[name] echo november 2011  EF 35%...echo.. november 2009/ ef 35%.[name] echo november 2011  . Sinus bradycardia   . Chronotropic incompetence   . Biventricular implantable cardiac defibrillator in situ     GDT CONTAK H170-MADIT-CRT-EXPLANTED 2011 implanted defibrillator- [employer] cognis, model n119-2001 Dr. Caryl Comes (11/28/2009)  . LBBB (left bundle branch block)   . Hyperlipemia   . GERD (gastroesophageal reflux disease)   . Colonic polyp 2010    pathology not clear.   . Crohn's disease   . Acute vascular insufficiency of intestine     Mesenteric embolus...surgery 2003  . Ventral hernia   . Hypertrophy of prostate with urinary obstruction and other lower urinary tract symptoms (LUTS)   . Degenerative joint disease   . Polymyalgia rheumatica   . Tremor   . Anxiety     . Shingles   . Vitamin B12 deficiency   . CAD (coronary artery disease)     CABG 1994  /   catheterization 1996, occluded vein graft to the diagonal, other grafts patent  /   catheterization 2006, no PCI  /  nuclear, October, 2011, extensive scar anteroseptal and apical, no ischemia  . Hx of CABG     1994  . Ejection fraction     EF 35%, echo, November, 2011  //   Is 35-40%, echo,  September 23, 2011  . Warfarin anticoagulation     Coumadin therapy for mesenteric embolus 2003  . Carotid artery disease     Doppler, February,  2012, 0-39% bilateral, recommended followup 1 year  . Edema leg     Venous Dopplers 8/13: No DVT bilaterally  . Breast tenderness     Spironolactone was stopped and eplerinone started. Patient feels better  . Thrombocytopenia 2008    Past Surgical History  Procedure Laterality Date  . Coronary artery bypass graft  1994  . Laparoscopic cholecystectomy  2002  . Elap w/ superior mesenteric artery embolectomy  1/03    by Dr. Jennette Banker  . Repair of incarcerated ventral hernia and lysis of adhesions  11/03    by Dr. Betsy Pries  . Implantation of biventric cardioverter-defibrillatorr      by Dr. Lovena Le in 8/06 [employer] [place] 2011  . Implantation of guidant cognis device      model [id number]  . Left inguinal hernia repair with mesh  6/08    by Dr. Betsy Pries  . Prostate surgery  08/2012    by urology in [place]  . Esophagogastroduodenoscopy N/A 04/18/2013    Procedure: ESOPHAGOGASTRODUODENOSCOPY (EGD);  Surgeon: Jerene Bears, MD;  Location: [facility] ENDOSCOPY;  Service: Endoscopy;  Laterality: N/A;  . Colonoscopy N/A 04/19/2013    Procedure: COLONOSCOPY;  Surgeon: Jerene Bears, MD;  Location: [facility] ENDOSCOPY;  Service: Endoscopy;  Laterality: N/A;    Patient Active Problem List   Diagnosis Date Noted  . Hypoalbuminemia 06/16/2013  . Left ankle pain 06/07/2013  . Acute blood loss anemia 04/17/2013  . Acute encephalopathy 04/17/2013  . GIB (gastrointestinal  bleeding) 04/17/2013  . UGIB (upper gastrointestinal bleed) 04/16/2013  . Warfarin-induced coagulopathy 04/16/2013  . Thrombocytopenia 11/30/2011  . Ejection fraction   . Edema leg   . OSA (obstructive sleep apnea) 09/16/2011  . CAD (coronary artery disease)   .  Hx of CABG   . Hypertension   . Chronic systolic heart failure   . Mitral regurgitation   . Chronotropic incompetence   . Hyperlipemia   . Crohn's disease   . Ventral hernia   . Tremor   . Warfarin anticoagulation   . Carotid artery disease   . 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 IMPLANTABLE DEFIBRILLATOR, GDT CONTAK H170-MADIT-CRT 12/09/2009  . HYPERTROPHY PROSTATE W/UR OBST & OTH LUTS 03/31/2009  . VITAMIN B12 DEFICIENCY 11/06/2008  . VITAMIN D DEFICIENCY 11/06/2008  . CARDIOMYOPATHY, ISCHEMIC  s/p CABG   09/15/2008  . LBBB 06/25/2008  . FATIGUE 06/25/2008  . COLONIC POLYPS 03/23/2007  . POLYMYALGIA RHEUMATICA 03/23/2007  . ANXIETY 02/21/2007  . GERD 02/21/2007  . DEGENERATIVE JOINT DISEASE 02/21/2007    ROS   Patient denies fever, chills, headache, sweats, rash, change in vision, change in hearing, chest pain, cough, nausea vomiting, urinary symptoms. All other systems are reviewed and are negative.  PHYSICAL EXAM  Patient is oriented to person time and place. Affect is normal. Head is atraumatic. Sclera and conjunctiva are normal. Is no jugulovenous distention. Lungs are clear. Respiratory effort is nonlabored. Cardiac exam reveals S1 and S2. The abdomen is soft. He has only trace peripheral edema. This is definitely improved since his last visit.  Filed Vitals:   06/18/13 1139  BP: 116/64  Pulse: 68  Height: 6' (1.829 m)  Weight: 210 lb (95.255 kg)     ASSESSMENT & PLAN

## 2013-06-18 NOTE — Patient Instructions (Signed)
Your physician recommends that you continue on your current medications as directed. Please refer to the Current Medication list given to you today.  Your physician recommends that you return for lab work in: today  Your physician recommends that you schedule a follow-up appointment in: 10 weeks

## 2013-06-18 NOTE — Assessment & Plan Note (Signed)
Potassium was 3.5 when checked recently. He's been given potassium. Labs will be checked today.

## 2013-06-25 ENCOUNTER — Telehealth: Payer: Self-pay | Admitting: Cardiology

## 2013-06-25 NOTE — Telephone Encounter (Signed)
Pts wife contacted about Nurse Jeani Hawking Via tried to contact pt on Friday about Dr [name] recommendation for pt to start taking 2 potassium tablets for 3 days only, then proceed back to taking 1 potassium tablet daily, due to recent BMET results and K being 3.4  Told wife pt should start taking the 2 potassium tabs today thru Thursday. On Friday should proceed back with taking 1 potassium tab.  Pts wife verbalized understanding and agrees with this treatment plan.

## 2013-06-25 NOTE — Telephone Encounter (Signed)
New message    Nurse called patient on Friday. Wife returning call this am per nurse request.

## 2013-06-28 ENCOUNTER — Ambulatory Visit (INDEPENDENT_AMBULATORY_CARE_PROVIDER_SITE_OTHER): Payer: Medicare Other | Admitting: Pharmacist

## 2013-06-28 DIAGNOSIS — K55059 Acute (reversible) ischemia of intestine, part and extent unspecified: Secondary | ICD-10-CM

## 2013-06-28 DIAGNOSIS — K55069 Acute infarction of intestine, part and extent unspecified: Secondary | ICD-10-CM

## 2013-06-28 LAB — POCT INR: INR: 2.2

## 2013-07-06 ENCOUNTER — Other Ambulatory Visit: Payer: Self-pay | Admitting: Internal Medicine

## 2013-07-26 ENCOUNTER — Ambulatory Visit (INDEPENDENT_AMBULATORY_CARE_PROVIDER_SITE_OTHER): Payer: Medicare Other | Admitting: Pharmacist

## 2013-07-26 DIAGNOSIS — K55059 Acute (reversible) ischemia of intestine, part and extent unspecified: Secondary | ICD-10-CM

## 2013-07-26 DIAGNOSIS — K55069 Acute infarction of intestine, part and extent unspecified: Secondary | ICD-10-CM

## 2013-07-26 LAB — POCT INR: INR: 2.7

## 2013-08-27 DIAGNOSIS — R351 Nocturia: Secondary | ICD-10-CM | POA: Diagnosis not present

## 2013-08-27 DIAGNOSIS — D4100 Neoplasm of uncertain behavior of unspecified kidney: Secondary | ICD-10-CM | POA: Diagnosis not present

## 2013-08-27 DIAGNOSIS — N390 Urinary tract infection, site not specified: Secondary | ICD-10-CM | POA: Diagnosis not present

## 2013-08-27 DIAGNOSIS — N400 Enlarged prostate without lower urinary tract symptoms: Secondary | ICD-10-CM | POA: Diagnosis not present

## 2013-08-30 ENCOUNTER — Encounter: Payer: Self-pay | Admitting: Family Medicine

## 2013-08-30 ENCOUNTER — Ambulatory Visit (INDEPENDENT_AMBULATORY_CARE_PROVIDER_SITE_OTHER): Payer: Medicare Other | Admitting: Family Medicine

## 2013-08-30 ENCOUNTER — Encounter: Payer: Medicare Other | Admitting: Internal Medicine

## 2013-08-30 VITALS — BP 122/70 | HR 70 | Temp 98.4°F | Ht 72.0 in | Wt 207.0 lb

## 2013-08-30 DIAGNOSIS — I2510 Atherosclerotic heart disease of native coronary artery without angina pectoris: Secondary | ICD-10-CM | POA: Diagnosis not present

## 2013-08-30 DIAGNOSIS — N138 Other obstructive and reflux uropathy: Secondary | ICD-10-CM

## 2013-08-30 DIAGNOSIS — K219 Gastro-esophageal reflux disease without esophagitis: Secondary | ICD-10-CM | POA: Diagnosis not present

## 2013-08-30 DIAGNOSIS — J209 Acute bronchitis, unspecified: Secondary | ICD-10-CM

## 2013-08-30 DIAGNOSIS — K50911 Crohn's disease, unspecified, with rectal bleeding: Secondary | ICD-10-CM

## 2013-08-30 DIAGNOSIS — N401 Enlarged prostate with lower urinary tract symptoms: Secondary | ICD-10-CM

## 2013-08-30 DIAGNOSIS — K5090 Crohn's disease, unspecified, without complications: Secondary | ICD-10-CM | POA: Diagnosis not present

## 2013-08-30 DIAGNOSIS — R609 Edema, unspecified: Secondary | ICD-10-CM

## 2013-08-30 DIAGNOSIS — I2589 Other forms of chronic ischemic heart disease: Secondary | ICD-10-CM | POA: Diagnosis not present

## 2013-08-30 DIAGNOSIS — I10 Essential (primary) hypertension: Secondary | ICD-10-CM | POA: Diagnosis not present

## 2013-08-30 DIAGNOSIS — E538 Deficiency of other specified B group vitamins: Secondary | ICD-10-CM

## 2013-08-30 NOTE — Patient Instructions (Signed)
Anemia, Nonspecific Anemia is a condition in which the concentration of red blood cells or hemoglobin in the blood is below normal. Hemoglobin is a substance in red blood cells that carries oxygen to the tissues of the body. Anemia results in not enough oxygen reaching these tissues.  CAUSES  Common causes of anemia include:   Excessive bleeding. Bleeding may be internal or external. This includes excessive bleeding from periods (in women) or from the intestine.   Poor nutrition.   Chronic kidney, thyroid, and liver disease.  Bone marrow disorders that decrease red blood cell production.  Cancer and treatments for cancer.  HIV, AIDS, and their treatments.  Spleen problems that increase red blood cell destruction.  Blood disorders.  Excess destruction of red blood cells due to infection, medicines, and autoimmune disorders. SIGNS AND SYMPTOMS   Minor weakness.   Dizziness.   Headache.  Palpitations.   Shortness of breath, especially with exercise.   Paleness.  Cold sensitivity.  Indigestion.  Nausea.  Difficulty sleeping.  Difficulty concentrating. Symptoms may occur suddenly or they may develop slowly.  DIAGNOSIS  Additional blood tests are often needed. These help your health care provider determine the best treatment. Your health care provider will check your stool for blood and look for other causes of blood loss.  TREATMENT  Treatment varies depending on the cause of the anemia. Treatment can include:   Supplements of iron, vitamin B12, or folic acid.   Hormone medicines.   A blood transfusion. This may be needed if blood loss is severe.   Hospitalization. This may be needed if there is significant continual blood loss.   Dietary changes.  Spleen removal. HOME CARE INSTRUCTIONS Keep all follow-up appointments. It often takes many weeks to correct anemia, and having your health care provider check on your condition and your response to  treatment is very important. SEEK IMMEDIATE MEDICAL CARE IF:   You develop extreme weakness, shortness of breath, or chest pain.   You become dizzy or have trouble concentrating.  You develop heavy vaginal bleeding.   You develop a rash.   You have bloody or black, tarry stools.   You faint.   You vomit up blood.   You vomit repeatedly.   You have abdominal pain.  You have a fever or persistent symptoms for more than 2-3 days.   You have a fever and your symptoms suddenly get worse.   You are dehydrated.  MAKE SURE YOU:  Understand these instructions.  Will watch your condition.  Will get help right away if you are not doing well or get worse. Document Released: 02/25/2004 Document Revised: 09/19/2012 Document Reviewed: 07/13/2012 ExitCare Patient Information 2015 [employer]. This information is not intended to replace advice given to you by your health care provider. Make sure you discuss any questions you have with your health care provider.  

## 2013-08-31 LAB — CBC
HCT: 35.9 % — ABNORMAL LOW (ref 39.0–52.0)
HEMOGLOBIN: 10.9 g/dL — AB (ref 13.0–17.0)
MCH: 21.6 pg — AB (ref 26.0–34.0)
MCHC: 30.4 g/dL (ref 30.0–36.0)
MCV: 71.1 fL — AB (ref 78.0–100.0)
Platelets: 111 10*3/uL — ABNORMAL LOW (ref 150–400)
RBC: 5.05 MIL/uL (ref 4.22–5.81)
RDW: 18.7 % — ABNORMAL HIGH (ref 11.5–15.5)
WBC: 5.8 10*3/uL (ref 4.0–10.5)

## 2013-09-02 ENCOUNTER — Encounter: Payer: Self-pay | Admitting: Family Medicine

## 2013-09-02 DIAGNOSIS — J209 Acute bronchitis, unspecified: Secondary | ICD-10-CM | POA: Insufficient documentation

## 2013-09-02 DIAGNOSIS — R609 Edema, unspecified: Secondary | ICD-10-CM | POA: Insufficient documentation

## 2013-09-02 HISTORY — DX: Acute bronchitis, unspecified: J20.9

## 2013-09-02 NOTE — Assessment & Plan Note (Signed)
Avoid offending foods, start probiotics. Do not eat large meals in late evening and consider raising head of bed.  

## 2013-09-02 NOTE — Progress Notes (Signed)
Patient ID: [name], male   DOB: [birth year], 80 y.o.   MRN: [id number] [name] [id number] 1931/10/09 09/02/2013      Progress Note-Follow Up  Subjective  Chief Complaint  No chief complaint on file.   HPI  Patient is a 80 year old male in today for routine medical care. He is recovering from bronchitis. He was treated with Levaquin and he improved but he does still have some mild congestion and cough. No other recent illness. Has had some increased pedal edema recently. No other acute concerns. Denies CP/palp/SOB/HA/congestion/fevers/GI or GU c/o. Taking meds as prescribed  Past Medical History  Diagnosis Date  . Dizziness     Positional dizziness  . Hypertension   . Ischemic cardiomyopathy     CABG 1994 CAD catherization 1996.[name] occluded vein graft to the diagnol, other grafts patent/  catherization 2006, no PCI/ nuclear.[name] october 2011.[name] extensive scar anteroseptal and apical.. no ischemia    . CHF (congestive heart failure)     chronic systolic  . Mitral regurgitation     mild.[name] echo november 2011  EF 35%...echo.. november 2009/ ef 35%.[name] echo november 2011  . Sinus bradycardia   . Chronotropic incompetence   . Biventricular implantable cardiac defibrillator in situ     GDT CONTAK H170-MADIT-CRT-EXPLANTED 2011 implanted defibrillator- [employer] cognis, model n119-2001 Dr. Caryl Comes (11/28/2009)  . LBBB (left bundle branch block)   . Hyperlipemia   . GERD (gastroesophageal reflux disease)   . Colonic polyp 2010    pathology not clear.   . Crohn's disease   . Acute vascular insufficiency of intestine     Mesenteric embolus...surgery 2003  . Ventral hernia   . Hypertrophy of prostate with urinary obstruction and other lower urinary tract symptoms (LUTS)   . Degenerative joint disease   . Polymyalgia rheumatica   . Tremor   . Anxiety   . Shingles   . Vitamin B12 deficiency   . CAD (coronary artery disease)     CABG 1994  /   catheterization 1996, occluded vein graft to  the diagonal, other grafts patent  /   catheterization 2006, no PCI  /  nuclear, October, 2011, extensive scar anteroseptal and apical, no ischemia  . Hx of CABG     1994  . Ejection fraction     EF 35%, echo, November, 2011  //   Is 35-40%, echo,  September 23, 2011  . Warfarin anticoagulation     Coumadin therapy for mesenteric embolus 2003  . Carotid artery disease     Doppler, February,  2012, 0-39% bilateral, recommended followup 1 year  . Edema leg     Venous Dopplers 8/13: No DVT bilaterally  . Breast tenderness     Spironolactone was stopped and eplerinone started. Patient feels better  . Thrombocytopenia 2008  . Edema 09/02/2013    Past Surgical History  Procedure Laterality Date  . Coronary artery bypass graft  1994  . Laparoscopic cholecystectomy  2002  . Elap w/ superior mesenteric artery embolectomy  1/03    by Dr. Jennette Banker  . Repair of incarcerated ventral hernia and lysis of adhesions  11/03    by Dr. Betsy Pries  . Implantation of biventric cardioverter-defibrillatorr      by Dr. Lovena Le in 8/06 [employer] [place] 2011  . Implantation of guidant cognis device      model [id number]  . Left inguinal hernia repair with mesh  6/08    by Dr. Betsy Pries  . Prostate  surgery  08/2012    by urology in [place]  . Esophagogastroduodenoscopy N/A 04/18/2013    Procedure: ESOPHAGOGASTRODUODENOSCOPY (EGD);  Surgeon: Jerene Bears, MD;  Location: [facility] ENDOSCOPY;  Service: Endoscopy;  Laterality: N/A;  . Colonoscopy N/A 04/19/2013    Procedure: COLONOSCOPY;  Surgeon: Jerene Bears, MD;  Location[facility] ENDOSCOPY;  Service: Endoscopy;  Laterality: N/A;    Family History  Problem Relation Age of Onset  . Heart disease Mother   . Hypertension    . Hyperlipidemia    . Tremor Sister   . Heart disease Sister     heart failure and disease  . Heart disease Father 86    Deceased   . Hypertension Father   . Appendicitis Brother     ruptured  . Diabetes Son   . Heart disease Sister   .  Hypertension Sister   . Dementia Sister   . Heart disease Sister     pedal edema  . Hypertension Brother   . Kidney disease Brother   . Myasthenia gravis Brother   . Cirrhosis Brother   . Cancer Brother     leukemia    History   Social History  . Marital Status: Married    Spouse Name: Kennyth Lose    Number of Children: 3  . 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 Smoking status: Former Smoker -- 1.00 packs/day for 20 years    Types: Cigarettes    Quit date: 01/31/1973  . Smokeless tobacco: Never Used  . Alcohol Use: Yes     Comment: occasional  . Drug Use: No  . Sexual Activity: No     Comment: lives with wife, no dietary restrictions   Other Topics Concern  . Not on file   Social History Narrative   Lives in [place], [place] with wife.     Current Outpatient Prescriptions on File Prior to Visit  Medication Sig Dispense Refill  . azelastine (ASTELIN) 137 MCG/SPRAY nasal spray Place 1 spray into both nostrils 2 (two) times daily. Use in each nostril as directed      . carvedilol (COREG) 6.25 MG tablet Take 1 tablet by mouth  twice a day with meals  180 tablet  1  . cholecalciferol (VITAMIN D) 1000 UNITS tablet Take 1,000 Units by mouth daily.       . Cyanocobalamin (B-12) 1000 MCG CAPS Take 1,000 mcg by mouth daily.       . fluticasone (FLONASE) 50 MCG/ACT nasal spray Place 2 sprays into both nostrils daily.      . furosemide (LASIX) 40 MG tablet Take 1 and 1/2 tablets (60  mg  total) by mouth daily.  135 tablet  1  . lisinopril (PRINIVIL,ZESTRIL) 5 MG tablet Take 1 tablet (5 mg total) by mouth daily.  90 tablet  3  . pantoprazole (PROTONIX) 40 MG tablet TAKE 1 TABLET EVERY DAY AT 12 NOON  30 tablet  5  . potassium chloride SA (K-DUR,KLOR-CON) 20 MEQ tablet Take 1 tablet (20 mEq total) by mouth daily.  30 tablet  3  . simvastatin (ZOCOR) 20 MG tablet Take 1 tablet by mouth at  bedtime  90  tablet  2  . warfarin (COUMADIN) 5 MG tablet Take 5 mg by mouth as directed.       No current facility-administered medications on file prior to visit.  No Known Allergies  Review of Systems  Review of Systems  Constitutional: Negative for fever and malaise/fatigue.  HENT: Positive for congestion.   Eyes: Negative for discharge.  Respiratory: Positive for cough. Negative for shortness of breath.   Cardiovascular: Negative for chest pain, palpitations and leg swelling.  Gastrointestinal: Negative for nausea, abdominal pain and diarrhea.  Genitourinary: Negative for dysuria.  Musculoskeletal: Negative for falls.  Skin: Negative for rash.  Neurological: Negative for loss of consciousness and headaches.  Endo/Heme/Allergies: Negative for polydipsia.  Psychiatric/Behavioral: Negative for depression and suicidal ideas. The patient is not nervous/anxious and does not have insomnia.     Objective  BP 122/70  Pulse 70  Temp(Src) 98.4 F (36.9 C) (Oral)  Ht 6' (1.829 m)  Wt 207 lb (93.895 kg)  BMI 28.07 kg/m2  SpO2 94%  Physical Exam  Physical Exam  Constitutional: He is oriented to person, place, and time and well-developed, well-nourished, and in no distress. No distress.  HENT:  Head: Normocephalic and atraumatic.  Eyes: Conjunctivae are normal.  Neck: Neck supple. No thyromegaly present.  Cardiovascular: Normal rate, regular rhythm and normal heart sounds.   No murmur heard. Pulmonary/Chest: Effort normal and breath sounds normal. No respiratory distress.  Abdominal: He exhibits no distension and no mass. There is no tenderness.  Musculoskeletal: He exhibits no edema.  Neurological: He is alert and oriented to person, place, and time.  Skin: Skin is warm.  Psychiatric: Memory, affect and judgment normal.    Lab Results  Component Value Date   TSH 1.59 03/11/2013   Lab Results  Component Value Date   WBC 5.8 08/30/2013   HGB 10.9* 08/30/2013   HCT 35.9* 08/30/2013    MCV 71.1* 08/30/2013   PLT 111* 08/30/2013   Lab Results  Component Value Date   CREATININE 1.1 06/18/2013   BUN 14 06/18/2013   NA 139 06/18/2013   K 3.4* 06/18/2013   CL 101 06/18/2013   CO2 32 06/18/2013   Lab Results  Component Value Date   ALT 11 06/10/2013   AST 22 06/10/2013   ALKPHOS 76 06/10/2013   BILITOT 2.0* 06/10/2013   Lab Results  Component Value Date   CHOL 170 03/11/2013   Lab Results  Component Value Date   HDL 60.90 03/11/2013   Lab Results  Component Value Date   LDLCALC 74 03/11/2013   Lab Results  Component Value Date   TRIG 175.0* 03/11/2013   Lab Results  Component Value Date   CHOLHDL 3 03/11/2013     Assessment & Plan  Hypertension Well controlled, no changes to meds. Encouraged heart healthy diet such as the DASH diet and exercise as tolerated.   CAD (coronary artery disease) Sees his cardiologist Dr [name] next week  GERD Avoid offending foods, start probiotics. Do not eat large meals in late evening and consider raising head of bed.   Crohn's disease With recent GI bleed his previous gastroenterologist in [place] has left practice so will refer for further care.   HYPERTROPHY PROSTATE W/UR OBST & OTH LUTS followswith Urology in [place] doing well at this time.   Edema May use an extra dose of Lasix once but then is encouraged to elevate feet, minimize sodium and try compression hose. Report if wowrsening  Acute bronchitis Improving after course of Levaquin. Encouraged plain mucinex, probiotics and rest, report worsening symptoms

## 2013-09-02 NOTE — Assessment & Plan Note (Signed)
Well controlled, no changes to meds. Encouraged heart healthy diet such as the DASH diet and exercise as tolerated.  °

## 2013-09-02 NOTE — Assessment & Plan Note (Signed)
followswith Urology in [place] doing well at this time.

## 2013-09-02 NOTE — Assessment & Plan Note (Signed)
With recent GI bleed his previous gastroenterologist in [place] has left practice so will refer for further care.

## 2013-09-02 NOTE — Assessment & Plan Note (Signed)
May use an extra dose of Lasix once but then is encouraged to elevate feet, minimize sodium and try compression hose. Report if wowrsening

## 2013-09-02 NOTE — Assessment & Plan Note (Signed)
Sees his cardiologist Dr [name] next week

## 2013-09-02 NOTE — Assessment & Plan Note (Signed)
Improving after course of Levaquin. Encouraged plain mucinex, probiotics and rest, report worsening symptoms

## 2013-09-04 ENCOUNTER — Encounter: Payer: Self-pay | Admitting: Cardiology

## 2013-09-04 ENCOUNTER — Ambulatory Visit: Payer: Medicare Other | Admitting: Cardiology

## 2013-09-04 ENCOUNTER — Ambulatory Visit (INDEPENDENT_AMBULATORY_CARE_PROVIDER_SITE_OTHER): Payer: Medicare Other | Admitting: Pharmacist

## 2013-09-04 ENCOUNTER — Ambulatory Visit (INDEPENDENT_AMBULATORY_CARE_PROVIDER_SITE_OTHER): Payer: Medicare Other | Admitting: Cardiology

## 2013-09-04 ENCOUNTER — Encounter: Payer: Self-pay | Admitting: Internal Medicine

## 2013-09-04 ENCOUNTER — Ambulatory Visit (INDEPENDENT_AMBULATORY_CARE_PROVIDER_SITE_OTHER): Payer: Medicare Other | Admitting: Internal Medicine

## 2013-09-04 VITALS — BP 122/60 | HR 60 | Ht 72.0 in | Wt 206.0 lb

## 2013-09-04 DIAGNOSIS — K55069 Acute infarction of intestine, part and extent unspecified: Secondary | ICD-10-CM

## 2013-09-04 DIAGNOSIS — I255 Ischemic cardiomyopathy: Secondary | ICD-10-CM

## 2013-09-04 DIAGNOSIS — I2589 Other forms of chronic ischemic heart disease: Secondary | ICD-10-CM | POA: Diagnosis not present

## 2013-09-04 DIAGNOSIS — K55059 Acute (reversible) ischemia of intestine, part and extent unspecified: Secondary | ICD-10-CM

## 2013-09-04 DIAGNOSIS — I5022 Chronic systolic (congestive) heart failure: Secondary | ICD-10-CM

## 2013-09-04 DIAGNOSIS — Z95810 Presence of automatic (implantable) cardiac defibrillator: Secondary | ICD-10-CM

## 2013-09-04 DIAGNOSIS — I2510 Atherosclerotic heart disease of native coronary artery without angina pectoris: Secondary | ICD-10-CM | POA: Diagnosis not present

## 2013-09-04 DIAGNOSIS — E876 Hypokalemia: Secondary | ICD-10-CM

## 2013-09-04 LAB — MDC_IDC_ENUM_SESS_TYPE_INCLINIC
Brady Statistic RA Percent Paced: 66 %
Brady Statistic RV Percent Paced: 95 %
Date Time Interrogation Session: 20150805040000
HighPow Impedance: 41 Ohm
HighPow Impedance: 57 Ohm
Implantable Pulse Generator Serial Number: 490477
Lead Channel Impedance Value: 419 Ohm
Lead Channel Impedance Value: 439 Ohm
Lead Channel Impedance Value: 524 Ohm
Lead Channel Pacing Threshold Amplitude: 0.6 V
Lead Channel Pacing Threshold Amplitude: 1 V
Lead Channel Pacing Threshold Amplitude: 1.1 V
Lead Channel Pacing Threshold Pulse Width: 0.4 ms
Lead Channel Pacing Threshold Pulse Width: 0.4 ms
Lead Channel Pacing Threshold Pulse Width: 0.8 ms
Lead Channel Sensing Intrinsic Amplitude: 2.5 mV
Lead Channel Sensing Intrinsic Amplitude: 20.9 mV
Lead Channel Sensing Intrinsic Amplitude: 25 mV
Lead Channel Setting Pacing Amplitude: 2 V
Lead Channel Setting Pacing Amplitude: 2 V
Lead Channel Setting Pacing Amplitude: 2.4 V
Lead Channel Setting Pacing Pulse Width: 0.4 ms
Lead Channel Setting Pacing Pulse Width: 0.8 ms
Lead Channel Setting Sensing Sensitivity: 0.6 mV
Lead Channel Setting Sensing Sensitivity: 1 mV
Zone Setting Detection Interval: 286 ms
Zone Setting Detection Interval: 333 ms

## 2013-09-04 LAB — BASIC METABOLIC PANEL WITH GFR
BUN: 11 mg/dL (ref 6–23)
CO2: 32 meq/L (ref 19–32)
Calcium: 9.3 mg/dL (ref 8.4–10.5)
Chloride: 98 meq/L (ref 96–112)
Creatinine, Ser: 1.1 mg/dL (ref 0.4–1.5)
GFR: 69.13 mL/min
Glucose, Bld: 96 mg/dL (ref 70–99)
Potassium: 3.8 meq/L (ref 3.5–5.1)
Sodium: 137 meq/L (ref 135–145)

## 2013-09-04 LAB — POCT INR: INR: 2.5

## 2013-09-04 MED ORDER — LISINOPRIL 10 MG PO TABS: 10.0000 mg | ORAL_TABLET | Freq: Every day | ORAL | Status: DC

## 2013-09-04 NOTE — Progress Notes (Signed)
Patient ID: [name], male   DOB: 11/08/1931, 80 y.o.   MRN: [id number]    HPI  Patient is seen today to followup his cardiomyopathy. I saw him last Jun 18, 2013. Prior to that he had a significant GI bleed. His medications had to be changed. Since then we have been slowly resuming his medications for his cardiomyopathy. His potassium has been on the low side and this is being followed. His weight is stable today. He's not having any significant shortness of breath.  No Known Allergies  Current Outpatient Prescriptions  Medication Sig Dispense Refill  . azelastine (ASTELIN) 137 MCG/SPRAY nasal spray Place 1 spray into both nostrils 2 (two) times daily. Use in each nostril as directed      . carvedilol (COREG) 6.25 MG tablet Take 1 tablet by mouth  twice a day with meals  180 tablet  1  . cholecalciferol (VITAMIN D) 1000 UNITS tablet Take 1,000 Units by mouth daily.       . Cyanocobalamin (B-12) 1000 MCG CAPS Take 1,000 mcg by mouth daily.       . fluticasone (FLONASE) 50 MCG/ACT nasal spray Place 2 sprays into both nostrils daily.      . furosemide (LASIX) 40 MG tablet Take 2 tablets in the morning and 1 tablet in the evening      . lisinopril (PRINIVIL,ZESTRIL) 5 MG tablet Take 1 tablet (5 mg total) by mouth daily.  90 tablet  3  . pantoprazole (PROTONIX) 40 MG tablet TAKE 1 TABLET EVERY DAY AT 12 NOON  30 tablet  5  . potassium chloride SA (K-DUR,KLOR-CON) 20 MEQ tablet Take 1 tablet (20 mEq total) by mouth daily.  30 tablet  3  . simvastatin (ZOCOR) 20 MG tablet Take 1 tablet by mouth at  bedtime  90 tablet  2  . warfarin (COUMADIN) 5 MG tablet Take 5 mg by mouth as directed.       No current facility-administered medications for this visit.    History   Social History  . Marital Status: Married    Spouse Name: Kennyth Lose    Number of Children: 3  . 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  Smoking status: Former Smoker -- 1.00 packs/day for 20 years    Types: Cigarettes    Quit date: 01/31/1973  . Smokeless tobacco: Never Used  . Alcohol Use: Yes     Comment: occasional  . Drug Use: No  . Sexual Activity: No     Comment: lives with wife, no dietary restrictions   Other Topics Concern  . Not on file   Social History Narrative   Lives in [place], [place] with wife.     Family History  Problem Relation Age of Onset  . Heart disease Mother   . Hypertension    . Hyperlipidemia    . Tremor Sister   . Heart disease Sister     heart failure and disease  . Heart disease Father 65    Deceased   . Hypertension Father   . Appendicitis Brother     ruptured  . Diabetes Son   . Heart disease Sister   . Hypertension Sister   . Dementia Sister   . Heart disease Sister     pedal edema  . Hypertension Brother   . Kidney disease Brother   .  Myasthenia gravis Brother   . Cirrhosis Brother   . Cancer Brother     leukemia    Past Medical History  Diagnosis Date  . Dizziness     Positional dizziness  . Hypertension   . Ischemic cardiomyopathy     CABG 1994 CAD catherization 1996.[name] occluded vein graft to the diagnol, other grafts patent/  catherization 2006, no PCI/ nuclear.[name] october 2011.[name] extensive scar anteroseptal and apical.. no ischemia    . CHF (congestive heart failure)     chronic systolic  . Mitral regurgitation     mild.[name] echo november 2011  EF 35%...echo.. november 2009/ ef 35%.[name] echo november 2011  . Sinus bradycardia   . Chronotropic incompetence   . Biventricular implantable cardiac defibrillator in situ     GDT CONTAK H170-MADIT-CRT-EXPLANTED 2011 implanted defibrillator- [employer] cognis, model n119-2001 Dr. Caryl Comes (11/28/2009)  . LBBB (left bundle branch block)   . Hyperlipemia   . GERD (gastroesophageal reflux disease)   . Colonic polyp 2010    pathology not clear.   . Crohn's disease   . Acute vascular insufficiency of intestine     Mesenteric  embolus...surgery 2003  . Ventral hernia   . Hypertrophy of prostate with urinary obstruction and other lower urinary tract symptoms (LUTS)   . Degenerative joint disease   . Polymyalgia rheumatica   . Tremor   . Anxiety   . Shingles   . Vitamin B12 deficiency   . CAD (coronary artery disease)     CABG 1994  /   catheterization 1996, occluded vein graft to the diagonal, other grafts patent  /   catheterization 2006, no PCI  /  nuclear, October, 2011, extensive scar anteroseptal and apical, no ischemia  . Hx of CABG     1994  . Ejection fraction     EF 35%, echo, November, 2011  //   Is 35-40%, echo,  September 23, 2011  . Warfarin anticoagulation     Coumadin therapy for mesenteric embolus 2003  . Carotid artery disease     Doppler, February,  2012, 0-39% bilateral, recommended followup 1 year  . Edema leg     Venous Dopplers 8/13: No DVT bilaterally  . Breast tenderness     Spironolactone was stopped and eplerinone started. Patient feels better  . Thrombocytopenia 2008  . Edema 09/02/2013  . Acute bronchitis 09/02/2013    Past Surgical History  Procedure Laterality Date  . Coronary artery bypass graft  1994  . Laparoscopic cholecystectomy  2002  . Elap w/ superior mesenteric artery embolectomy  1/03    by Dr. Jennette Banker  . Repair of incarcerated ventral hernia and lysis of adhesions  11/03    by Dr. Betsy Pries  . Implantation of biventric cardioverter-defibrillatorr      by Dr. Lovena Le in 8/06 [employer] 2011  . Implantation of guidant cognis device      model [id number]  . Left inguinal hernia repair with mesh  6/08    by Dr. Betsy Pries  . Prostate surgery  08/2012    by urology in [place]  . Esophagogastroduodenoscopy N/A 04/18/2013    Procedure: ESOPHAGOGASTRODUODENOSCOPY (EGD);  Surgeon: Jerene Bears, MD;  Location: [facility] ENDOSCOPY;  Service: Endoscopy;  Laterality: N/A;  . Colonoscopy N/A 04/19/2013    Procedure: COLONOSCOPY;  Surgeon: Jerene Bears, MD;  Location: [facility]  ENDOSCOPY;  Service: Endoscopy;  Laterality: N/A;    Patient Active Problem List   Diagnosis Date Noted  . Edema 09/02/2013  .  Acute bronchitis 09/02/2013  . Hypokalemia 06/18/2013  . Hypoalbuminemia 06/16/2013  . Left ankle pain 06/07/2013  . Acute blood loss anemia 04/17/2013  . Acute encephalopathy 04/17/2013  . GIB (gastrointestinal bleeding) 04/17/2013  . UGIB (upper gastrointestinal bleed) 04/16/2013  . Warfarin-induced coagulopathy 04/16/2013  . Thrombocytopenia 11/30/2011  . Ejection fraction   . Edema leg   . OSA (obstructive sleep apnea) 09/16/2011  . CAD (coronary artery disease)   . Hx of CABG   . Hypertension   . Chronic systolic heart failure   . Mitral regurgitation   . Chronotropic incompetence   . Hyperlipemia   . Crohn's disease   . Ventral hernia   . Tremor   . Warfarin anticoagulation   . Carotid artery disease   . 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 IMPLANTABLE DEFIBRILLATOR, GDT CONTAK H170-MADIT-CRT 12/09/2009  . HYPERTROPHY PROSTATE W/UR OBST & OTH LUTS 03/31/2009  . VITAMIN B12 DEFICIENCY 11/06/2008  . VITAMIN D DEFICIENCY 11/06/2008  . CARDIOMYOPATHY, ISCHEMIC  s/p CABG   09/15/2008  . LBBB 06/25/2008  . FATIGUE 06/25/2008  . COLONIC POLYPS 03/23/2007  . POLYMYALGIA RHEUMATICA 03/23/2007  . ANXIETY 02/21/2007  . GERD 02/21/2007  . DEGENERATIVE JOINT DISEASE 02/21/2007    ROS   Patient denies fever, chills, headache, sweats, rash, change in vision, change in hearing, chest pain, cough, nausea or vomiting, urinary symptoms. All other systems are reviewed and are negative.  PHYSICAL EXAM  Patient is oriented to person time and place. Affect is normal. His speech pattern is slow. This is usual for him. Head is atraumatic. Sclera and conjunctiva are normal. There is no jugulovenous distention. Lungs are clear. Respiratory effort is nonlabored. Cardiac exam is S1 and S2. Abdomen is soft. He has mild chronic changes in his legs but he does not have  pitting edema at this time.  Filed Vitals:   09/04/13 1104  BP: 122/60  Pulse: 60  Height: 6' (1.829 m)  Weight: 206 lb (93.441 kg)     ASSESSMENT & PLAN

## 2013-09-04 NOTE — Assessment & Plan Note (Signed)
His volume status is stable today. His weight is down a few pounds since the last visit. Chemistry lab will be today.

## 2013-09-04 NOTE — Assessment & Plan Note (Signed)
Coronary disease is stable. No change in therapy.

## 2013-09-04 NOTE — Assessment & Plan Note (Signed)
Potassium will be checked today.

## 2013-09-04 NOTE — Assessment & Plan Note (Signed)
I will increase his lisinopril today to try to re\re optimize his medications.

## 2013-09-04 NOTE — Patient Instructions (Signed)
**Note De-Identified  Obfuscation** Your physician has recommended you make the following change in your medication: increase Lisinopril to 10 mg daily  Your physician recommends that you return for lab work in: Today  Your physician wants you to follow-up in: 3 months. You will receive a reminder letter in the mail two months in advance. If you don't receive a letter, please call our office to schedule the follow-up appointment.

## 2013-09-04 NOTE — Patient Instructions (Addendum)

## 2013-09-04 NOTE — Progress Notes (Signed)
Patient Care Team: [name], MD as PCP - General (Family Medicine)   HPI  [name] is a 80 y.o. male ICD implanted for primary prevention in the setting of ischemic coronary disease with prior CABG as part of the MADIT-CRT trial.   His last catheterization in 2006 demonstrated a total right and 80% marginal. Vein graft to the diagonal was occluded and other bypasses were open; ejection fraction 2007 was about 35%  Nuclear scan Oct 2011>>scar no ischemia    The patient denies chest pain, shortness of breath, nocturnal dyspnea, orthopnea or peripheral edema.  There have been no palpitations, lightheadedness or syncope.    Past Medical History  Diagnosis Date  . Dizziness     Positional dizziness  . Hypertension   . Ischemic cardiomyopathy     CABG 1994 CAD catherization 1996.[name] occluded vein graft to the diagnol, other grafts patent/  catherization 2006, no PCI/ nuclear.[name] october 2011.[name] extensive scar anteroseptal and apical.. no ischemia    . CHF (congestive heart failure)     chronic systolic  . Mitral regurgitation     mild.[name] echo november 2011  EF 35%...echo.. november 2009/ ef 35%.[name] echo november 2011  . Sinus bradycardia   . Chronotropic incompetence   . Biventricular implantable cardiac defibrillator in situ     GDT CONTAK H170-MADIT-CRT-EXPLANTED 2011 implanted defibrillator- [employer] cognis, model n119-2001 Dr. Caryl Comes (11/28/2009)  . LBBB (left bundle branch block)   . Hyperlipemia   . GERD (gastroesophageal reflux disease)   . Colonic polyp 2010    pathology not clear.   . Crohn's disease   . Acute vascular insufficiency of intestine     Mesenteric embolus...surgery 2003  . Ventral hernia   . Hypertrophy of prostate with urinary obstruction and other lower urinary tract symptoms (LUTS)   . Degenerative joint disease   . Polymyalgia rheumatica   . Tremor   . Anxiety   . Shingles   . Vitamin B12 deficiency   . CAD (coronary artery disease)    CABG 1994  /   catheterization 1996, occluded vein graft to the diagonal, other grafts patent  /   catheterization 2006, no PCI  /  nuclear, October, 2011, extensive scar anteroseptal and apical, no ischemia  . Hx of CABG     1994  . Ejection fraction     EF 35%, echo, November, 2011  //   Is 35-40%, echo,  September 23, 2011  . Warfarin anticoagulation     Coumadin therapy for mesenteric embolus 2003  . Carotid artery disease     Doppler, February,  2012, 0-39% bilateral, recommended followup 1 year  . Edema leg     Venous Dopplers 8/13: No DVT bilaterally  . Breast tenderness     Spironolactone was stopped and eplerinone started. Patient feels better  . Thrombocytopenia 2008  . Edema 09/02/2013  . Acute bronchitis 09/02/2013    Past Surgical History  Procedure Laterality Date  . Coronary artery bypass graft  1994  . Laparoscopic cholecystectomy  2002  . Elap w/ superior mesenteric artery embolectomy  1/03    by Dr. Jennette Banker  . Repair of incarcerated ventral hernia and lysis of adhesions  11/03    by Dr. Betsy Pries  . Implantation of biventric cardioverter-defibrillatorr      by Dr. Lovena Le in 8/06 [employer] [place] 2011  . Implantation of guidant cognis device      model [id number]  . Left inguinal  hernia repair with mesh  6/08    by Dr. Betsy Pries  . Prostate surgery  08/2012    by urology in [place]  . Esophagogastroduodenoscopy N/A 04/18/2013    Procedure: ESOPHAGOGASTRODUODENOSCOPY (EGD);  Surgeon: Jerene Bears, MD;  Location: [facility] ENDOSCOPY;  Service: Endoscopy;  Laterality: N/A;  . Colonoscopy N/A 04/19/2013    Procedure: COLONOSCOPY;  Surgeon: Jerene Bears, MD;  Location: [facility] ENDOSCOPY;  Service: Endoscopy;  Laterality: N/A;    Current Outpatient Prescriptions  Medication Sig Dispense Refill  . azelastine (ASTELIN) 137 MCG/SPRAY nasal spray Place 1 spray into both nostrils 2 (two) times daily. Use in each nostril as directed      . carvedilol (COREG) 6.25 MG tablet Take 1  tablet by mouth  twice a day with meals  180 tablet  1  . cholecalciferol (VITAMIN D) 1000 UNITS tablet Take 1,000 Units by mouth daily.       . Cyanocobalamin (B-12) 1000 MCG CAPS Take 1,000 mcg by mouth daily.       . fluticasone (FLONASE) 50 MCG/ACT nasal spray Place 2 sprays into both nostrils daily.      . furosemide (LASIX) 40 MG tablet Take 2 tablets in the morning and 1 tablet in the evening      . lisinopril (PRINIVIL,ZESTRIL) 10 MG tablet Take 1 tablet (10 mg total) by mouth daily.  90 tablet  3  . pantoprazole (PROTONIX) 40 MG tablet TAKE 1 TABLET EVERY DAY AT 12 NOON  30 tablet  5  . potassium chloride SA (K-DUR,KLOR-CON) 20 MEQ tablet Take 1 tablet (20 mEq total) by mouth daily.  30 tablet  3  . simvastatin (ZOCOR) 20 MG tablet Take 1 tablet by mouth at  bedtime  90 tablet  2  . warfarin (COUMADIN) 5 MG tablet Take 5 mg by mouth as directed.       No current facility-administered medications for this visit.    No Known Allergies  Review of Systems negative except from HPI and PMH  Physical Exam There were no vitals taken for this visit. Well developed and well nourished in no acute distress HENT normal E scleral and icterus clear Neck Supple Clear to ausculation Device pocket well healed; without hematoma or erythema.  There is no tethering Regular rate and rhythm, no murmurs gallops or rub Soft with active bowel sounds No clubbing cyanosis  Edema Alert and oriented, grossly normal motor and sensory function Skin Warm and Dry    Assessment and  Plan Ischemic cardiomyopathy Without symptoms of ischemia\  Implantable defibrillator-[employer] The patient's device was interrogated.  The information was reviewed. No changes were made in the programming.     Congestive heart-chronic-systolic Euvolemic continue current meds

## 2013-09-09 ENCOUNTER — Encounter: Payer: Self-pay | Admitting: Pulmonary Disease

## 2013-09-09 ENCOUNTER — Ambulatory Visit: Payer: Medicare Other | Admitting: Pulmonary Disease

## 2013-09-09 VITALS — BP 116/72 | HR 68 | Temp 97.5°F | Ht 72.0 in | Wt 207.2 lb

## 2013-09-09 DIAGNOSIS — K50918 Crohn's disease, unspecified, with other complication: Secondary | ICD-10-CM

## 2013-09-09 DIAGNOSIS — E785 Hyperlipidemia, unspecified: Secondary | ICD-10-CM

## 2013-09-09 DIAGNOSIS — R5383 Other fatigue: Secondary | ICD-10-CM

## 2013-09-09 DIAGNOSIS — J309 Allergic rhinitis, unspecified: Secondary | ICD-10-CM

## 2013-09-09 DIAGNOSIS — I10 Essential (primary) hypertension: Secondary | ICD-10-CM

## 2013-09-09 DIAGNOSIS — I447 Left bundle-branch block, unspecified: Secondary | ICD-10-CM

## 2013-09-09 DIAGNOSIS — I2589 Other forms of chronic ischemic heart disease: Secondary | ICD-10-CM

## 2013-09-09 DIAGNOSIS — D62 Acute posthemorrhagic anemia: Secondary | ICD-10-CM

## 2013-09-09 DIAGNOSIS — M1990 Unspecified osteoarthritis, unspecified site: Secondary | ICD-10-CM

## 2013-09-09 DIAGNOSIS — Z95810 Presence of automatic (implantable) cardiac defibrillator: Secondary | ICD-10-CM

## 2013-09-09 DIAGNOSIS — G4733 Obstructive sleep apnea (adult) (pediatric): Secondary | ICD-10-CM

## 2013-09-09 DIAGNOSIS — J302 Other seasonal allergic rhinitis: Secondary | ICD-10-CM

## 2013-09-09 DIAGNOSIS — K219 Gastro-esophageal reflux disease without esophagitis: Secondary | ICD-10-CM

## 2013-09-09 DIAGNOSIS — D126 Benign neoplasm of colon, unspecified: Secondary | ICD-10-CM

## 2013-09-09 DIAGNOSIS — K55069 Acute infarction of intestine, part and extent unspecified: Secondary | ICD-10-CM

## 2013-09-09 DIAGNOSIS — F411 Generalized anxiety disorder: Secondary | ICD-10-CM

## 2013-09-09 DIAGNOSIS — R5381 Other malaise: Secondary | ICD-10-CM

## 2013-09-09 DIAGNOSIS — I2510 Atherosclerotic heart disease of native coronary artery without angina pectoris: Secondary | ICD-10-CM

## 2013-09-09 DIAGNOSIS — I5022 Chronic systolic (congestive) heart failure: Secondary | ICD-10-CM

## 2013-09-09 HISTORY — DX: Allergic rhinitis, unspecified: J30.9

## 2013-09-09 NOTE — Patient Instructions (Signed)
Today we updated your med list in our EPIC system...    Continue your current medications the same...  Add FeSO4 OTC supplement daily...  Call for any questions or if I can be of service in any way...  Let's plan a follow up visit in 33mo sooner if needed for problems..Marland KitchenMarland Kitchen

## 2013-09-09 NOTE — Progress Notes (Signed)
Subjective:    Patient ID: [name], male    DOB: 1931/10/17, 80 y.o.   MRN: [id number]  HPI 80 y/o WM here for a follow up visit... he has multiple medical problems as noted below...   ~  followed by GI- DrChowdhury/ then [facility] [place]> now followed by DrPyrtle at [facility] w/ sm bowel Crohn's- s/p mult resections w/ complications, prev on 6-MP for prevention of recurrence... also has sl incr fasting bili c/w Gilbert's... ~  followed by [name] for Cards- CAD, s/p CABG, Cardiomyopathy w/ AICD- stable... f/u 2DEcho 11/09 showed diffuseHK w/ EF= 35% & DD, mod incr AoV thickness w/o AS... EKG= pacer rhythm.  ~  November 30, 2011:  32moROV & JWille Glaseris c/o a URI/ cold/ sinus infection w/ congestion/ drainage/ beige sput/ etc; we discussed ZPak & Saline for treatment... We reviewed the following medical problems during today's office visit>>     OSA> eval by DrClance 8/13 & started on CPAP but having some interface problems & they are checking download for compliance etc...    HBP> on Coreg6.25Bid, Lisin10, Lasix40-1.5/d, Aldactone25; BP= 116/72 & he denies any CP, palpit, ch in SOB/ edema/ etc...    CAD, Cardiomyopathy> on ASA81 & Coumadin; s/p CABG 1994, [facility] & Lasix increased to 623md; last seen by DrKlein 7/13 & ICD functioning normally...    Chol> on Simva20; not fasting today & his last FLP 10/12 showed TChol 124, TG 109, HDL 49, LDL 54    GI- GERD, Polyps> stable & he currently denies abd pain, n/v, c/d, blood seen...    Crohn's dis, Hx SBO> he [place] DrHolmes, [place] and [name] & they have stopped his 6MP; Crohn's has been stable since then 7 he indicated he may start a biologic med in the future...    Hx mesenteric vasc insuffic w/ embolectomy> on Coumadin & monitored in the CC, stable...    BPH> aware- prev checked by drNesi, PSAs have been wnl...    DJD, PMR> on B12, VitD, Folate; stable & not  requiring pain meds or Pred...    Tremor> eval by DrDalphine Handingn the past w/ trial of Clonazepam w/ some improvement...    Anxiety> not currently on Klonopin etc...    Low Platlet ct> [place], [place]; Plats ranged 67-73K & they stopped his 6MP Rx for crohn's dis... We reviewed prob list, meds, xrays and labs> see below for updates >>   ~  April 18, 2012:  46m20moVHolcombs several complaints/ concerns as listed below>>      C/o an URI w/ head congestion, drainage, etc but denies f/c/s, cough/ sput/ SOB & we discussed OTC meds to use prn...    BP controlled on meds> Coreg6.25Bid, Lisin10, Lasix40-1.5/d, Aldactone25; BP= 110/70 & he denies CP, palpit, SOB, etc...    He is c/o bilat breast swelling, sore & tender; he is on Aldactone25 & this is the likely culprit; we will switch to Eplerenone25...    He remains on Simva20 for his Chol & last FLP was 10/12 w/ TChol 124, TG 109, HDL 49, LDL 54; he needs to ret fasting for f/u labs...    He [place] DrHolmes GI [place] f/u Crohn's 12/13> stable on Cholestyramine 4gm/d...    Wife says he's grumpy; offered med rx- SSRI etc  but he declines...     He reports a Melanoma removed from left calf area recently w/ some post op swelling; path showed superfic spreading melanoma & it is being managed by DrLupton w/ Bx then wide excision We reviewed prob list, meds, xrays and labs> see below for updates >> he remains on VitD, B12, & Folate...  ~  July 17, 2012:  89moROV & add-on appt at wife's request due to feet swelling L>R w/ some discoloration & pain on left foot- hurts to walk on it x 1wk;  When pressed for explanation [name] mentioned that he did in fact drop a monkey wrench on his left foot ~1wk ago;  Exam shows ven insuff w/ some mild edema on right but left foot has several ecchymoses, 3+ edema, some redness (no broken skin etc) & tender over 5th metatarsal area (top, side, & bottom);  XRay shows marked sublux vs  dislocation at 5th MTP joint, no acute fx, +for osseous demineralization, etc...   REC> refer to Ortho ASAP...    Other medical issues appear stable>  BP=132/78 on his Coreg6.25Bid, Lisinopril10, Lasix40-2Qam, & Eplerenone25 (all breast tenderness has resolved);  He saw DBenay Spice4/14- CAD, cardiomyopathy, some incr edema & Lasix incr to 80Qam;  Stable on Coumadin & his AICD is functioning normally;  Chol looks good on his diet + Simva20;  He [place] & [facility] to [facility] (likely due to his prev 6MP rx...   ~  November 05, 2012:  3-428moOV & [name] saw Ortho after his last appt- DrHilts felts he had a chr dislocated 5th MTP, used compression wrap & his swelling improved over time;  In the interim he has developed prostate trouble- [facility] he says he had surg but we don't have any records; apparently he had Hematuria work up w/ urine, XRays, cysto- told to have BPH and had ?TURP? (we have requested records to review), he notes that urine stream is better now... We reviewed the following medical problems during today's office visit >>     OSA> eval by DrClance 8/13 & started on CPAP but having some interface problems & he is not currently using the apparatus...    HBP> on Coreg6.25Bid, Lisin10, Lasix40-1.5/d, Inspra25; BP= 140/86 & he denies any CP, palpit, ch in SOB/ edema/ etc...    CAD, Cardiomyopathy> on ASA81 & Coumadin; s/p CABG 1994, AICD placed & last seen by DrKlein 7/14- ICD functioning normally; CAD, s/p CABG w/ one occluded graft, EF=35%, Myoview 10/11 w/ scar, no ischemia; Lasix adjusted to improve periph edema...     Chol> on Simva20; not fasting today & his last FLP 10/12 showed TChol 124, TG 109, HDL 49, LDL 54... Needs to ret for f/u FLP.    GI- GERD, Polyps> stable & he currently denies abd pain, n/v, c/d, blood seen...    Crohn's dis, Hx SBO> he is followed by DrHolmes, [place] and he [name]  [place] & they have stopped his 6MP; Crohn's has been stable since then & he indicated he may start a biologic med in the future...    Hx mesenteric vasc insuffic w/ embolectomy> on Coumadin & monitored in the CC, stable...    BPH> aware- [facility]; [facility]  requested records...    DJD, PMR> on B12, VitD, Folate; stable & not requiring pain meds or Pred... He has seen DrHilts for left foot pain, VI, chr dislocated 5th MTP, plantar fasciitis...    Tremor> eval by Dalphine Handing in the past w/ trial of Clonazepam w/ some improvement...    Anxiety> not currently on Klonopin etc...    Low Platlet ct> He saw a Hematologist in [place] on referral by DrHolmes, GI in [place]; Plats ranged 67-73K & they stopped his 6MP Rx for crohn's dis & he needs f/u labs... We reviewed prob list, meds, xrays and labs> see below for updates >>  HE NEEDS TO RET FOR f/u FASTING blood work...  ~  March 11, 2013:  16moROV & JWille Glaserhas been staqble> CC is nasal congestion, drainage, stopped up; we doiscussed avail OTC meds to help w/ these symptoms- antihist, saline, fluticasone, and we will write for Astelin as well...  BP controlled on Coreg6.25Bid, Lisin10, Lasix40-1.5/d, Inspra25; BP= 136/82 & he feels ok w/o HA, CP, palpit, SOB, edema, etc...  He saw DrKatz 10/14> f/u cardiomyopathy, on Coumadin w/ hx mesenteric embolus, doing satis, no changes made Chol looks good on Simva20 w/ FLP 2/15 showing TChol 170, TG 175, HDL 61, LDL 74; discussed low fat diet... He reports being stable from the GI standpoint & stopped his Questran; hasn't been back to his GI in awhile...  [place] hx hematuria & flank pain, BPH w/ obstruction; pt had a TURP in [place] (we do not have op note etc)...  Known thrombocytopenia, [place] "it's just my pattern" he says...    We  reviewed prob list, meds, xrays and labs> see below for updates >> Pt was given Pneumovax & TDAP today...  LABS 2/15:  FLP- Chol ok on Simva20 but TG=175 discussed diet;  Chems- ok x incr fasting bili c/w Gilbert's;  CBC- wnl x low platelets stable at 72K;  TSH=1.59;  Sed=2...  ~  September 09, 2013:  634moOV & [name] has established [facility] [place] [facility] [facility] [place]    [facility]IBle[facility]- PT/INR was 6 on adm, EGD showed abn mucosa in the prepyloric region of the stomach & bx was neg for HPylori, Colon showed mild erythema & edema, few sessile polyps, w/o inflamm changes or bleeding sites seen; blood could have come from the sm bowel based on his hx- Hg nadir was 8.5 & he was transfused 2uPC's;  Hg has improved to 11 range but MCV is still low at 71 & Iron has not been checked- I told him to start OTC Fe daily; he is back on Coumadin per [facility] w/ careful monitoring... He has had f/u w/ AE & sched to see DrPyrtle soon...    He had f/u DrKatz for Cards 8/15> CAD, s/p CABG, ICM and chr sys CHF w/ ICD per DrKlein, meds adjusted- on Coreg6.25Bid, Lisin10, Lasix40Bid, K20, Coumadin via CC...    From the Pulm standpoint he had AR w/ Flonase & Astelin but breathing has been satis- denies cough, sput, hemoptysis, ch in SOB/DOE etc;  He had bronchitic infection several months ago & he tells me he took wife's antibiotics and got better!  He also has hx OSA- prev on CPAP but he prob w/ the interface & hasn't used it in yrs- resting ok & denies daytime hypersomnolence;   last CXR 3/15 showed cardiomeg,  pacer, s/p CABG, azygos lobe, ?underlying COPD- we continue to follow for any resp issues...    We reviewed prob list, meds, xrays and labs> see below for updates >>           Problem List:  OSA >> He had a sleep study w/ AHI=34- loud snoring & abnormal breathing pattern noted by wife; pt notes freq awakenings & not feeling rested in the AM; but he denied issues w/  daytime sleepiness & his ESS= only1... Started on CPAP & they are following up... ~  He reports mask/ interface problems and he is not using the CPAP; asked to check w/ DME & f/u w/ DrClance...  AR/ AB >> From the Pulm standpoint he had AR w/ Flonase & Astelin but breathing has been satis- denies cough, sput, hemoptysis, ch in SOB/DOE etc;  He had bronchitic infection several months ago & he tells me he took wife's antibiotics and got better!  He also has hx OSA- prev on CPAP but he prob w/ the interface & hasn't used it in yrs- resting ok & denies daytime hypersomnolence;   last CXR 3/15 showed cardiomeg, pacer, s/p CABG, azygos lobe, ?underlying COPD- we continue to follow for any resp issues...   CARDS followed by [name] & DrKlein >>  HBP, CAD, Cardiomyopathy >> followed by [name] and DrKlein... he had CABG in 1994 (with cath in 96 revealing a total right and an 80% OM, vein graft to the diagonal was occluded, other bypasses were patent), Biv AICD in 2006...  On COUMADIN (follow in clinic), ASA 1m/d,  COREG 6.212mid,  LISINOPRIL 2041m,  LASIX 26m1m  SPIRONOLACTONE 25mg65m. ~  Cardiolite 3/03 w/ EF=37%...  ~  Cath 6/06 w/ native vessel and graft disease...  ~  2DEcho 11/09 stable w/ mod LVD- diffuse HK w/ EF= 35%, DD, mod incr AoV thickness w/o AS...  ~  3/11:  CXR showed stable CABG, AICD, & chr changes... ~  10/11:  Myoview showed extensive scar, no ischemia, EF= 34% ~  10/11:  New AICD placed by DrKlein... ~  11/11:  2DEcho showed mod reduced LV sys function w/ EF= 30-35%, diffuse HK, gr1 DD> see report... ~  10/12:  BP= 126/68 & stable from the CV perspective... ~  4/13:  He's had f/u w/ Cards> DrKatz et al; notes reviewed, no med changes other than Coumadin regulation... ~  7/13:  His AICD was interrogated by DrKlein & no problems identified... ~  8/13:  2DEcho showed mild LVH, mod decr LVF w/ EF=35-40% w/ HK of the entire myocardium, Gr1DD, mild AoV sclerosis, mild MR, normal  RV... ~  8/13:  VenDopplers of LEs ordered by Cards- neg for DVT ~  10/13:  He had f/u DrKatz who noted edema is diminished on the Lasix60... ~  10/13: on Coreg6.25Bid, Lisin10, Lasix40-1.5/d, Aldactone25; BP= 116/72 & he denies any CP, palpit, ch in SOB/ edema/ etc... ~  3/14:  BP controlled on meds> Coreg6.25Bid, Lisin10, Lasix40-1.5/d, Aldactone25; BP= 110/70 & he denies CP, palpit, SOB, etc; but he has developed gynecomastia from the aldactone & we will switch to Eplerenone25... ~  6/14:  BP=132/78 on his Coreg6.25Bid, Lisinopril10, Lasix40-2Qam, & Eplerenone25 (all breast tenderness has resolved);  He saw DrKatBenay Spice- CAD, cardiomyopathy, some incr edema & Lasix incr to 80Qam... ~  10/14: on Coreg6.25Bid, Lisin10, Lasix40-1.5/d, Inspra25; BP= 140/86 & he denies any CP, palpit, ch in SOB/ edema/ etc; Also on ASA81 & Coumadin; s/p CABG 1994, AICD placed & last  seen by DrKlein 7/14- ICD functioning normally; CAD, s/p CABG w/ one occluded graft, EF=35%, Myoview 10/11 w/ scar, no ischemia; Lasix adjusted to improve periph edema ~  2/15: BP controlled on Coreg6.25Bid, Lisin10, Lasix40-1.5/d, Inspra25; BP= 136/82 & he feels ok w/o HA, CP, palpit, SOB, edema, etc; He saw DrKatz 10/14> f/u cardiomyopathy, on Coumadin w/ hx mesenteric embolus, doing satis, no changes made.  Primary Care per DrBlythe >>  HYPERLIPIDEMIA - he takes SIMVASTATIN 41m/d... he's had min, non-progressive incr LFTs noted. ~  F[place]4/08 showed TChol 138, TG 138, HDL 38, LDL 72... tolerating med well. ~  F[place]4/09 showed TChol 118, TG 111, HDL 33, LDL 63 ~  FLP 12/09 showed TChol 118, TG 100, HDL 31, LDL 67 ~  FLP 10/10 on Simva20 showed TChol 111, TG 139, HDL 36, LDL 47 ~  FLP 10/11 on Simva20 showed TChol 101, TG 102, HDL 35, LDL 45 ~  FLP 10/12 on Simva20 showed TChol 124, TG 109, HDL 49, LDL 54 ~  FLP 10/13 & 3/14> pt wasn't fasting for blood work today... ~  [place]/15 on Simva20 showed TChol 170, TG 175, HDL 61, LDL 74  GI per  DrPyrtle >>  GERD (ICD-530.81) - no active symptoms and not currently taking PPI etc...  COLONIC POLYPS (ICD-211.3) - last colonoscopy here was 7/04 by DDemetra Shinerw/ divertics and several polyps from 6 - 249msize, reportedly adenomatous on bx but I cannot find the path reports... he is overdue for f/u colon... ~  labs 2/10 showed Hg= 15.7...Marland KitchenMarland Kitchene will send copy of note to DrChowdhury w/ request for f/u colonoscopy. ~  f/u colonoscopy 11/10 by DrChowdhury showed norm TI & several polyps removed- path +tubular adenoma...  CROHN'S DISEASE (ICD-555.9) - he [place]Chowdhury [place] 6-MP 5058mabs- 1.5 tabs/d... he notes intermittent diarrhea and was started on Questran Prn... ~  labs 3/11 noted sl incr LFTs, Protime, & Sed... ~  labs 10/11 showed norm chems & LFTs x incr bili, Plat=88K, BNP=219, otherw norm. ~  Labs 10/12 showed norm chems, SGOT=59, TBili=3.9, Plat=85K... ~  [facility] from [facility]13> off 6MP due to thrombocytopenia & doing satis, considering a biologic if needed, considering repeat colonoscopy... ~  [facility]> doing satis on Cholestyramine4gm daily, no changes made... ~  2/15: He reports being stable from the GI standpoint & stopped his Questran; hasn't been back to his GI in awhile...   Hx of ACUTE VASCULAR INSUFFICIENCY OF INTESTINE (ICD-557.0) - he has embolectomy of the superior mesenteric artery 1/03 by DrLSheryn Bisonong hospitalization w/ complications)... he has remained on COUMADIN in the coumadin clinic ever since then...  VENTRAL HERNIA (ICD-553.20) - he is s/p left inguinal hernia repair 6/08 by DrIClarita Lebernd had a prev incarcerated ventral hernia repaired 11/03... his residual/ recurrent ventral hernia has been evaluated by DrIClarita Lebero feels that [place] her[place] comes to surg... currently he wears an abd binder when up and about...  SBO >> Hosp 3/14 - 04/19/11 by [facility] w/ SBO that opened up w/o surgical intervention... ~   CT Abd 3/13 showed early SBO pattern from an anterior wall hernia defect; ?GE varicies noted, s/p GB, hepatic steatosis, calcif granulomas in liver, cyst in right kidney, atherosclerotic ulcer in infrarenal Ao, ectasia of iliacs...  [place]  HYPERTROPHY PROSTATE W/UR OBST & OTH LUTS (ICD-600.01) - saw DrNesi 7/10 w/ BPH, obstructive symptoms, and ED- given  Flomax & "shots"...  ~  Labs 10/12 showed PSA= 2.93 ~  10/14: he tells me he had Prostate surg by [facility] in [place], we do not have records & have requested these... ~  2/15: He had Urology eval in [place], DrHanspal> hx hematuria & flank pain, BPH w/ obstruction; pt had a TURP in [place] (we do not have op note etc)...   DEGENERATIVE JOINT DISEASE (ICD-715.90) >>  ~  6/14:  He presented w/ swelling in the feet, but L>R w/ pain & tender laterally; then he admitted to dropping a monkey wrench on his foot 1 week ago; XRay showed subluxed/ dislocated 5th MTP=> to Ortho ASAP...  Hx of POLYMYALGIA RHEUMATICA (ICD-725) - hasn't req steroids x yrs... ~  labs 2/10 showed Sed= 8 ~  labs 10/10 showed Sed= 6 ~  labs 3/11 showed Sed= 51 w/ acute illness. ~  labs 10/11 showed Sed= 9 ~  Labs 10/12 showed Sed= 9  TREMOR (ICD-781.0) - [facility] 3[facility]10 [facility]- essential tremor w/ +FamHx in sister... trial Clonazepam 0.17m Bid w/ improvement...  ANXIETY (ICD-300.00) - he uses the same Clonazepam 0.268mPrn...  Hx of SHINGLES (ICD-053.9)  Thrombocytopenia >> serial labs w/ Plat counts betw 223 ==> 85; no bleeding diathesis, & he had acute intestinal vasc insuffic 2003 requiring embolectomy & has been on Coumadin Rx ever since then... ~  [place], [place]; Plats ranged 67-73K & they stopped his 6MP Rx for crohn's dis & he needs f/u labs.  [facility] & Hg= 8.5 assoc w/ PT/ INR ~6 ~  Follow up labs & Rx per GI & Primary Care...   VITAMIN B12  DEFICIENCY (ICD-266.2) - on B 12 100021md OTC... ~  labs 2/10 showed B12 level = 215... rec start oral B12 supplement daily. ~  labs 10/10 showed B12 level = 416... continue oral B12 supplement. ~  labs 10/11 showed B12= 592... continue same. ~  Labs 10/12 showed B12= 573  VITAMIN D DEFICIENCY (ICD-268.9) - on Vit D 1000 u daily OTC... ~  labs 2/10 showed Vit D level = 23... rec> start Vit D 1000 u daily.. ~  Labs 10/12 showed Vit D level = 50   Past Surgical History  Procedure Laterality Date  . Coronary artery bypass graft  1994  . Laparoscopic cholecystectomy  2002  . Elap w/ superior mesenteric artery embolectomy  1/03    by Dr. LawJennette Banker Repair of incarcerated ventral hernia and lysis of adhesions  11/03    by Dr. HinBetsy Pries Implantation of biventric cardioverter-defibrillatorr      by Dr. TayLovena Le 8/06 Guidant ConSheridan11  . Implantation of guidant cognis device      model [id number] Left inguinal hernia repair with mesh  6/08    by Dr. HinBetsy Pries Prostate surgery  08/2012    [place] Esophagogastrodu[place]oscopy N/A 04/18/2013    Procedure: ESOPHAGOGASTRODUODENOSCOPY (EGD);  Surgeon: JayJerene BearsD;  [facility];  Service: Endoscopy;  Laterality: N/A;  . Colonoscopy N/A 04/19/2013    Procedure: COLONOSCOPY;  Surgeon: JayJerene BearsD;  [facility];  Service: Endoscopy;  Laterality: N/A;    Outpatient Encounter Prescriptions as of 09/09/2013  Medication Sig  . carvedilol (COREG) 6.25 MG tablet Take 1 tablet by mouth  twice a day with meals  . cholecalciferol (VITAMIN  D) 1000 UNITS tablet Take 1,000 Units by mouth daily.   . Cyanocobalamin (B-12) 1000 MCG CAPS Take 1,000 mcg by mouth daily.   . fluticasone (FLONASE) 50 MCG/ACT nasal spray Place 2 sprays into both nostrils daily.  . furosemide (LASIX) 40 MG tablet Take 1 tablet  in the morning and 1 tablet in the evening  . lisinopril (PRINIVIL,ZESTRIL) 10 MG tablet  Take 1 tablet (10 mg total) by mouth daily.  . pantoprazole (PROTONIX) 40 MG tablet TAKE 1 TABLET EVERY DAY AT 12 NOON  . potassium chloride SA (K-DUR,KLOR-CON) 20 MEQ tablet Take 1 tablet (20 mEq total) by mouth daily.  . simvastatin (ZOCOR) 20 MG tablet Take 1 tablet by mouth at  bedtime  . warfarin (COUMADIN) 5 MG tablet Take 5 mg by mouth as directed.  . [DISCONTINUED] azelastine (ASTELIN) 137 MCG/SPRAY nasal spray Place 1 spray into both nostrils 2 (two) times daily. Use in each nostril as directed    No Known Allergies   Current Medications, Allergies, Past Medical History, Past Surgical History, Family History, and Social History were reviewed in [employer] record.    Review of Systems         See HPI - all other systems neg except as noted... The patient complains of decreased hearing and dyspnea on exertion.  The patient denies anorexia, fever, weight loss, weight gain, vision loss, hoarseness, chest pain, syncope, peripheral edema, prolonged cough, headaches, hemoptysis, abdominal pain, melena, hematochezia, severe indigestion/heartburn, hematuria, incontinence, muscle weakness, suspicious skin lesions, transient blindness, difficulty walking, depression, unusual weight change, abnormal bleeding, enlarged lymph nodes, and angioedema.     Objective:   Physical Exam     WD, WN, 80 y/o WM in NAD... GENERAL:  Alert & oriented; pleasant & cooperative... HEENT:  [place]/AT, EOM-wnl, PERRLA, EACs-clear, TMs-wnl, NOSE- sl red/ inflamm, THROAT-clear & wnl. NECK:  Supple w/ fairROM; no JVD; normal carotid impulses w/o bruits; no thyromegaly or nodules palpated; no lymphadenopathy. CHEST:  Decr BS w/ few scat rhonchi, no wheezing, no rales, no signs of consolidation. HEART:  Regular Rhythm; gr 1/6 SEM without rubs or gallops detected... ABDOMEN:  Soft & nontender; mult scars, & ventral hernia; normal bowel sounds; no organomegaly or masses palpated. EXT: without  deformities, mod arthritic changes; no varicose veins/ venous insuffic/ or edema. NEURO:  CN's intact; I do not apprec a tremor at present; no focal neuro deficits... DERM:  No lesions noted; no rash etc...  RADIOLOGY DATA:  Reviewed in the EPIC EMR & discussed w/ the patient...  LABORATORY DATA:  Reviewed in the EPIC EMR & discussed w/ the patient...   Assessment & Plan:    AR/ AB/ OSA >> he had AB exac this summer & took wife's antibiotics w/ good response; reminded to call us prn breathing problems...    HBP>  BP controlled, continue same meds per Cards and Primary Care...  CAD/ Cardiomyopathy>  Followed by [name] & DrKlein, their notes are reviewed...  Hyperlipid>  On Simva20 + diet> continue same...  GI>  Followed by DrPyrtle for [facility] now...  Hx vasc insuffic intestines>  SMA embolectomy 2003, he remains on Coumadin via the CC...  Hx SBO requiring Hosp 3/13 but resolved w/ conserv approach per DrHIngram, [facility]...  Hx UGIB 3/15> assoc w/ PT/INR~6, Hg nadir ~8, tx 2u, ?source- EGD/Colon per DrPyrtle, bleeding stopped spont  Left FOOT PAIN => dislocated left 5th MTP joint; seen by DrHilts who felt this was chronic, treated edema w/  compression hose & swelling is down...  Other medical problems as noted...   Patient's Medications  New Prescriptions   No medications on file  Previous Medications   CARVEDILOL (COREG) 6.25 MG TABLET    Take 1 tablet by mouth  twice a day with meals   CHOLECALCIFEROL (VITAMIN D) 1000 UNITS TABLET    Take 1,000 Units by mouth daily.    CYANOCOBALAMIN (B-12) 1000 MCG CAPS    Take 1,000 mcg by mouth daily.    FLUTICASONE (FLONASE) 50 MCG/ACT NASAL SPRAY    Place 2 sprays into both nostrils daily.   FUROSEMIDE (LASIX) 40 MG TABLET    Take 1 tablet  in the morning and 1 tablet in the evening   LISINOPRIL (PRINIVIL,ZESTRIL) 10 MG TABLET    Take 1 tablet (10 mg total) by mouth daily.   PANTOPRAZOLE (PROTONIX) 40 MG TABLET    TAKE 1 TABLET EVERY DAY  AT 12 NOON   POTASSIUM CHLORIDE SA (K-DUR,KLOR-CON) 20 MEQ TABLET    Take 1 tablet (20 mEq total) by mouth daily.   SIMVASTATIN (ZOCOR) 20 MG TABLET    Take 1 tablet by mouth at  bedtime   WARFARIN (COUMADIN) 5 MG TABLET    Take 5 mg by mouth as directed.  Modified Medications   No medications on file  Discontinued Medications   AZELASTINE (ASTELIN) 137 MCG/SPRAY NASAL SPRAY    Place 1 spray into both nostrils 2 (two) times daily. Use in each nostril as directed

## 2013-09-10 DIAGNOSIS — L570 Actinic keratosis: Secondary | ICD-10-CM | POA: Diagnosis not present

## 2013-09-10 DIAGNOSIS — L819 Disorder of pigmentation, unspecified: Secondary | ICD-10-CM | POA: Diagnosis not present

## 2013-09-10 DIAGNOSIS — Z85820 Personal history of malignant melanoma of skin: Secondary | ICD-10-CM | POA: Diagnosis not present

## 2013-09-10 DIAGNOSIS — D235 Other benign neoplasm of skin of trunk: Secondary | ICD-10-CM | POA: Diagnosis not present

## 2013-10-02 ENCOUNTER — Other Ambulatory Visit: Payer: Self-pay | Admitting: Cardiology

## 2013-10-12 ENCOUNTER — Other Ambulatory Visit: Payer: Self-pay | Admitting: Cardiology

## 2013-10-16 ENCOUNTER — Ambulatory Visit (INDEPENDENT_AMBULATORY_CARE_PROVIDER_SITE_OTHER): Payer: Medicare Other | Admitting: Pharmacist

## 2013-10-16 DIAGNOSIS — K55059 Acute (reversible) ischemia of intestine, part and extent unspecified: Secondary | ICD-10-CM | POA: Diagnosis not present

## 2013-10-16 DIAGNOSIS — K55069 Acute infarction of intestine, part and extent unspecified: Secondary | ICD-10-CM

## 2013-10-16 LAB — POCT INR: INR: 2

## 2013-11-12 NOTE — Telephone Encounter (Signed)
error 

## 2013-11-22 ENCOUNTER — Ambulatory Visit: Payer: Medicare Other | Admitting: Family Medicine

## 2013-11-26 ENCOUNTER — Other Ambulatory Visit (INDEPENDENT_AMBULATORY_CARE_PROVIDER_SITE_OTHER): Payer: Medicare Other

## 2013-11-26 ENCOUNTER — Encounter: Payer: Self-pay | Admitting: Internal Medicine

## 2013-11-26 ENCOUNTER — Ambulatory Visit (INDEPENDENT_AMBULATORY_CARE_PROVIDER_SITE_OTHER): Payer: Medicare Other

## 2013-11-26 ENCOUNTER — Ambulatory Visit (INDEPENDENT_AMBULATORY_CARE_PROVIDER_SITE_OTHER): Payer: Medicare Other | Admitting: Internal Medicine

## 2013-11-26 VITALS — BP 134/60 | HR 64 | Ht 72.0 in | Wt 210.5 lb

## 2013-11-26 DIAGNOSIS — R195 Other fecal abnormalities: Secondary | ICD-10-CM | POA: Diagnosis not present

## 2013-11-26 DIAGNOSIS — K50919 Crohn's disease, unspecified, with unspecified complications: Secondary | ICD-10-CM

## 2013-11-26 DIAGNOSIS — B370 Candidal stomatitis: Secondary | ICD-10-CM

## 2013-11-26 DIAGNOSIS — D509 Iron deficiency anemia, unspecified: Secondary | ICD-10-CM

## 2013-11-26 DIAGNOSIS — K55069 Acute infarction of intestine, part and extent unspecified: Secondary | ICD-10-CM

## 2013-11-26 DIAGNOSIS — Z860100 Personal history of colon polyps, unspecified: Secondary | ICD-10-CM

## 2013-11-26 DIAGNOSIS — K5090 Crohn's disease, unspecified, without complications: Secondary | ICD-10-CM | POA: Diagnosis not present

## 2013-11-26 DIAGNOSIS — K550 Acute vascular disorders of intestine: Secondary | ICD-10-CM

## 2013-11-26 DIAGNOSIS — I2589 Other forms of chronic ischemic heart disease: Secondary | ICD-10-CM

## 2013-11-26 LAB — CBC WITH DIFFERENTIAL/PLATELET
BASOS ABS: 0 10*3/uL (ref 0.0–0.1)
BASOS PCT: 0.4 % (ref 0.0–3.0)
EOS ABS: 0.1 10*3/uL (ref 0.0–0.7)
Eosinophils Relative: 1.4 % (ref 0.0–5.0)
HCT: 46.6 % (ref 39.0–52.0)
Hemoglobin: 15.2 g/dL (ref 13.0–17.0)
Lymphocytes Relative: 35.4 % (ref 12.0–46.0)
Lymphs Abs: 1.6 10*3/uL (ref 0.7–4.0)
MCHC: 32.6 g/dL (ref 30.0–36.0)
MCV: 84.5 fl (ref 78.0–100.0)
MONO ABS: 0.7 10*3/uL (ref 0.1–1.0)
Monocytes Relative: 14.4 % — ABNORMAL HIGH (ref 3.0–12.0)
NEUTROS ABS: 2.2 10*3/uL (ref 1.4–7.7)
NEUTROS PCT: 48.4 % (ref 43.0–77.0)
Platelets: 73 10*3/uL — ABNORMAL LOW (ref 150.0–400.0)
RBC: 5.51 Mil/uL (ref 4.22–5.81)
RDW: 22.6 % — AB (ref 11.5–15.5)
WBC: 4.6 10*3/uL (ref 4.0–10.5)

## 2013-11-26 LAB — IBC PANEL
Iron: 74 ug/dL (ref 42–165)
Saturation Ratios: 21 % (ref 20.0–50.0)
TRANSFERRIN: 251.8 mg/dL (ref 212.0–360.0)

## 2013-11-26 LAB — POCT INR: INR: 2.9

## 2013-11-26 LAB — FERRITIN: Ferritin: 35.9 ng/mL (ref 22.0–322.0)

## 2013-11-26 LAB — IGA: IgA: 241 mg/dL (ref 68–378)

## 2013-11-26 MED ORDER — NYSTATIN 100000 UNIT/ML MT SUSP: 5.0000 mL | Freq: Four times a day (QID) | OROMUCOSAL | Status: DC

## 2013-11-26 NOTE — Progress Notes (Signed)
Subjective:    Patient ID: MILLER LIMEHOUSE, male    DOB: 06-04-1931, 80 y.o.   MRN: [id number]  HPI Shamon Cothran is an 80 year old man with a past medical history of Crohn's enteritis status post resection, resultant ventral hernia, hospitalization for occult GI bleeding in March 2015, ischemic cardiomyopathy on warfarin with defibrillator in place, history of iron deficiency anemia who is seen in follow-up. He is here today with his wife. He was last seen by Nicoletta Ba, PA-C on 05/27/2013. He reports he continues to have fairly low energy levels and for the last week he has been having issues with loose stools. He reports nonbloody, non-melenic stools occurring 3-4 times per day. In the last several days they can be urgent. He has not used any anti-diarrheal medication recently. He is taking oral iron 325 mg daily. He denies abdominal pain. He has noticed some left hip pain which radiates into his left lower extremity. He also reports burning in his feet bilaterally. He feels he is eating well, 3 meals daily. His wife notes that he is eating less meat and is preferring vegetables. He has noticed a white coating on his tongue which seems to come and go. He continues to take warfarin as directed. He is using pantoprazole 40 mg daily. He denies heartburn, dysphagia or odynophagia.  Review of Systems As per history of present illness, otherwise negative  Current Medications, Allergies, Past Medical History, Past Surgical History, Family History and Social History were reviewed in [employer] record.     Objective:   Physical Exam BP 134/60  Pulse 64  Ht 6' (1.829 m)  Wt 210 lb 8 oz (95.482 kg)  BMI 28.54 kg/m2 Constitutional: Well-developed and well-nourished. No distress. HEENT: Normocephalic and atraumatic. Oropharynx is clear and moist. Oral thrush noted on the tongue. Conjunctivae are normal.  No scleral icterus. Neck: Neck supple. Trachea midline. Cardiovascular:  Normal rate, regular rhythm and intact distal pulses. Defibrillator left upper chest, 2/6 SEM Pulmonary/chest: Effort normal and breath sounds normal. No wheezing, rales or rhonchi. Abdominal: Soft, well-healed abdominal scars with large ventral hernia,  nontender, nondistended. Bowel sounds active throughout. Extremities: no clubbing, cyanosis, or edema Neurological: Alert and oriented to person place and time. Psychiatric: Normal mood and affect. Behavior is normal.  EGD and colonoscopy reviewed from March 2015. EGD was negative, prepyloric gastritis was biopsied and negative for H. pylori dysplasia or malignancy.. Colonoscopy with a few small sessile polyps in the colon which were not removed due to recent bleeding. Very mild erythema in the right colon without ulceration or definite evidence of Crohn's colitis.  CBC    Component Value Date/Time   WBC 5.8 08/30/2013 1500   RBC 5.05 08/30/2013 1500   HGB 10.9* 08/30/2013 1500   HCT 35.9* 08/30/2013 1500   PLT 111* 08/30/2013 1500   MCV 71.1* 08/30/2013 1500   MCH 21.6* 08/30/2013 1500   MCHC 30.4 08/30/2013 1500   RDW 18.7* 08/30/2013 1500   LYMPHSABS 1.3 05/27/2013 1204   MONOABS 0.7 05/27/2013 1204   EOSABS 0.1 05/27/2013 1204   BASOSABS 0.0 05/27/2013 1204    Iron/TIBC/Ferritin/ %Sat    Component Value Date/Time   IRON 93 05/25/2011 1227   IRONPCTSAT 32.3 05/25/2011 1227   CT scan with and without contrast performed November 2014 at urology clinic in [place] -- no evidence of active Crohn's disease     Assessment & Plan:   80 year old man with a past medical history of Crohn's  enteritis status post resection, resultant ventral hernia, hospitalization for occult GI bleeding in March 2015, ischemic cardiomyopathy on warfarin with defibrillator in place, history of iron deficiency anemia who is seen in follow-up.  1. IDA, hx of small bowel Crohn's disease -- Crohn's enteritis has been remote and I do not have any objective evidence of  active Crohn's disease at this time. The source of his prior GI bleeding was never completely elucidated. Capsule study was considered but deferred due to prior abdominal surgery and concern for retained capsular even obstruction. His hemoglobin has been stable though he remains microcytic. He is on warfarin and therefore I do not feel he is having significant bleeding at this time. I have recommended repeat CBC with iron studies today. I will also check a celiac panel. I have recommended he continue oral iron daily. He had CT scan in 2014 with no evidence of active Crohn's disease.  2. Loose stools -- occurring just the last several days. I instructed that he use Imodium per box instructions but if diarrhea continues for more than one additional week or worsens, that he notify me. If this occurs we would plan GI pathogen panel.  3. Thrush -- nystatin swish and swallow 4 times daily and for an additional 48 hours after thrush resolves. He is reminded to rinse his mouth after using fluticasone  4. Small colon polyp -- not removed in March 2015 due to very low risk, bleeding at that time, and age. It is unlikely this polyp would ever grow to bother him. He understands this and agrees with not repeating colonoscopy at this time.  Followup 3 months, sooner if necessary

## 2013-11-26 NOTE — Patient Instructions (Signed)
Your physician has requested that you go to the basement for  lab work before leaving today. We have sent  medications to your pharmacy for you to pick up at your convenience. Please use imodium as needed for diarrhea. Follow up in 3 months with Dr Hilarie Fredrickson. CC:  Penni Homans MD

## 2013-11-27 LAB — TISSUE TRANSGLUTAMINASE, IGA: TISSUE TRANSGLUTAMINASE AB, IGA: 5.5 U/mL (ref <20)

## 2013-11-28 ENCOUNTER — Other Ambulatory Visit: Payer: Self-pay | Admitting: Cardiology

## 2013-11-29 NOTE — Telephone Encounter (Signed)
Spoke with Pharmacist and she states does have refill on the warfarin so this refill request denied

## 2013-12-05 ENCOUNTER — Ambulatory Visit: Payer: Medicare Other

## 2013-12-05 LAB — MDC_IDC_ENUM_SESS_TYPE_REMOTE
Brady Statistic RA Percent Paced: 84 %
Brady Statistic RV Percent Paced: 97 %
HIGH POWER IMPEDANCE MEASURED VALUE: 53 Ohm
Implantable Pulse Generator Serial Number: 490477
Lead Channel Impedance Value: 367 Ohm
Lead Channel Impedance Value: 450 Ohm
Lead Channel Sensing Intrinsic Amplitude: 2.5 mV
Lead Channel Setting Pacing Amplitude: 2 V
Lead Channel Setting Pacing Amplitude: 2 V
Lead Channel Setting Sensing Sensitivity: 0.6 mV
MDC IDC MSMT BATTERY REMAINING LONGEVITY: 66 mo
MDC IDC MSMT LEADCHNL RV IMPEDANCE VALUE: 569 Ohm
MDC IDC SET LEADCHNL LV PACING PULSEWIDTH: 0.8 ms
MDC IDC SET LEADCHNL LV SENSING SENSITIVITY: 1 mV
MDC IDC SET LEADCHNL RV PACING AMPLITUDE: 2.4 V
MDC IDC SET LEADCHNL RV PACING PULSEWIDTH: 0.4 ms
MDC IDC SET ZONE DETECTION INTERVAL: 333 ms
Zone Setting Detection Interval: 286 ms

## 2013-12-06 ENCOUNTER — Encounter: Payer: Self-pay | Admitting: Cardiology

## 2013-12-06 ENCOUNTER — Ambulatory Visit (INDEPENDENT_AMBULATORY_CARE_PROVIDER_SITE_OTHER): Payer: Medicare Other | Admitting: *Deleted

## 2013-12-06 ENCOUNTER — Ambulatory Visit (INDEPENDENT_AMBULATORY_CARE_PROVIDER_SITE_OTHER): Payer: Medicare Other | Admitting: Cardiology

## 2013-12-06 VITALS — BP 128/72 | HR 63 | Ht 72.0 in | Wt 210.0 lb

## 2013-12-06 DIAGNOSIS — I255 Ischemic cardiomyopathy: Secondary | ICD-10-CM | POA: Diagnosis not present

## 2013-12-06 DIAGNOSIS — I5022 Chronic systolic (congestive) heart failure: Secondary | ICD-10-CM

## 2013-12-06 DIAGNOSIS — R5383 Other fatigue: Secondary | ICD-10-CM

## 2013-12-06 DIAGNOSIS — I2589 Other forms of chronic ischemic heart disease: Secondary | ICD-10-CM

## 2013-12-06 MED ORDER — FUROSEMIDE 40 MG PO TABS: 40.0000 mg | ORAL_TABLET | Freq: Two times a day (BID) | ORAL | Status: DC

## 2013-12-06 NOTE — Patient Instructions (Signed)
Your physician has recommended you make the following change in your medication: increase Furosemide to 40 mg twice daily   Your physician recommends that you schedule a follow-up appointment in: 3 months.

## 2013-12-06 NOTE — Assessment & Plan Note (Signed)
His weight is up a few pounds since last visit. It is difficult to know if this is volume or not. When he was volume overloaded he did have some abdominal distention. He has a little bit now. I will increase his diuretic dose.

## 2013-12-06 NOTE — Assessment & Plan Note (Signed)
I have tried to titrate his medications optimally.

## 2013-12-06 NOTE — Progress Notes (Signed)
Remote ICD transmission.   

## 2013-12-06 NOTE — Progress Notes (Signed)
Patient ID: [name], male   DOB: [birth year], 80 y.o.   MRN: [id number]    HPI  patient is seen today to follow-up cardiomyopathy. He is recovering from a significant GI bleed. At that time his meds had been changed. I have slowly returned them back to his baseline. He is not having any chest pain. He has shortness of breath only when he exerts himself excessively. He says he feels fatigued in general. He has a hard time getting started in the  Morning. None of his symptoms sound cardiac at this time.  No Known Allergies  Current Outpatient Prescriptions  Medication Sig Dispense Refill  . carvedilol (COREG) 6.25 MG tablet Take 1 tablet by mouth  twice a day with meals 180 tablet 3  . cholecalciferol (VITAMIN D) 1000 UNITS tablet Take 1,000 Units by mouth daily.     . Cyanocobalamin (B-12) 1000 MCG CAPS Take 1,000 mcg by mouth daily.     . furosemide (LASIX) 40 MG tablet Take 1 and 1/2 tablets (60  mg  total) by mouth daily. 135 tablet 3  . KLOR-CON M20 20 MEQ tablet TAKE 1 TABLET (20 MEQ TOTAL) BY MOUTH DAILY. 30 tablet 1  . lisinopril (PRINIVIL,ZESTRIL) 10 MG tablet Take 1 tablet (10 mg total) by mouth daily. 90 tablet 3  . pantoprazole (PROTONIX) 40 MG tablet TAKE 1 TABLET EVERY DAY AT 12 NOON 30 tablet 5  . simvastatin (ZOCOR) 20 MG tablet Take 1 tablet by mouth at  bedtime 90 tablet 2  . warfarin (COUMADIN) 5 MG tablet Take as directed by  anticoagulation clinic 150 tablet 1   No current facility-administered medications for this visit.    History   Social History  . Marital Status: Married    Spouse Name: Kennyth Lose    Number of Children: 3  . 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 Smoking status: Former Smoker -- 1.00 packs/day for 20 years    Types: Cigarettes    Quit date: 01/31/1973  . Smokeless tobacco: Never Used  . Alcohol Use: Yes     Comment: occasional  . Drug Use: No  .  Sexual Activity: No     Comment: lives with wife, no dietary restrictions   Other Topics Concern  . Not on file   Social History Narrative   Lives in [place], [place] with wife.     Family History  Problem Relation Age of Onset  . Heart disease Mother   . Hypertension    . Hyperlipidemia    . Tremor Sister   . Heart disease Sister     heart failure and disease  . Heart disease Father 32    Deceased   . Hypertension Father   . Appendicitis Brother     ruptured  . Diabetes Son   . Heart disease Sister   . Hypertension Sister   . Dementia Sister   . Heart disease Sister     pedal edema  . Hypertension Brother   . Kidney disease Brother   . Myasthenia gravis Brother   . Cirrhosis Brother   . Cancer Brother     leukemia    Past Medical History  Diagnosis Date  . Dizziness     Positional dizziness  . Hypertension   . Ischemic cardiomyopathy     CABG 1994 CAD catherization 1996.[name] occluded vein  graft to the diagnol, other grafts patent/  catherization 2006, no PCI/ nuclear.[name] october 2011.[name] extensive scar anteroseptal and apical.. no ischemia    . CHF (congestive heart failure)     chronic systolic  . Mitral regurgitation     mild.[name] echo november 2011  EF 35%...echo.. november 2009/ ef 35%.[name] echo november 2011  . Sinus bradycardia   . Chronotropic incompetence   . Biventricular implantable cardiac defibrillator in situ     GDT CONTAK H170-MADIT-CRT-EXPLANTED 2011 implanted defibrillator- [employer] cognis, model n119-2001 Dr. Caryl Comes (11/28/2009)  . LBBB (left bundle branch block)   . Hyperlipemia   . GERD (gastroesophageal reflux disease)   . Colonic polyp 2010    pathology not clear.   . Crohn's disease   . Acute vascular insufficiency of intestine     Mesenteric embolus...surgery 2003  . Ventral hernia   . Hypertrophy of prostate with urinary obstruction and other lower urinary tract symptoms (LUTS)   . Degenerative joint disease   . Polymyalgia rheumatica   . Tremor    . Anxiety   . Shingles   . Vitamin B12 deficiency   . CAD (coronary artery disease)     CABG 1994  /   catheterization 1996, occluded vein graft to the diagonal, other grafts patent  /   catheterization 2006, no PCI  /  nuclear, October, 2011, extensive scar anteroseptal and apical, no ischemia  . Hx of CABG     1994  . Ejection fraction     EF 35%, echo, November, 2011  //   Is 35-40%, echo,  September 23, 2011  . Warfarin anticoagulation     Coumadin therapy for mesenteric embolus 2003  . Carotid artery disease     Doppler, February,  2012, 0-39% bilateral, recommended followup 1 year  . Edema leg     Venous Dopplers 8/13: No DVT bilaterally  . Breast tenderness     Spironolactone was stopped and eplerinone started. Patient feels better  . Thrombocytopenia 2008  . Edema 09/02/2013  . Acute bronchitis 09/02/2013    Past Surgical History  Procedure Laterality Date  . Coronary artery bypass graft  1994  . Laparoscopic cholecystectomy  2002  . Elap w/ superior mesenteric artery embolectomy  1/03    by Dr. Jennette Banker  . Repair of incarcerated ventral hernia and lysis of adhesions  11/03    by Dr. Betsy Pries  . Implantation of biventric cardioverter-defibrillatorr      by Dr. Lovena Le in 8/06 [employer] [place] 2011  . Implantation of guidant cognis device      model [id number]  . Left inguinal hernia repair with mesh  6/08    by Dr. Betsy Pries  . Prostate surgery  08/2012    by urology in [place]  . Esophagogastroduodenoscopy N/A 04/18/2013    Procedure: ESOPHAGOGASTRODUODENOSCOPY (EGD);  Surgeon: Jerene Bears, MD;  Location: [facility] ENDOSCOPY;  Service: Endoscopy;  Laterality: N/A;  . Colonoscopy N/A 04/19/2013    Procedure: COLONOSCOPY;  Surgeon: Jerene Bears, MD;  Location: [facility] ENDOSCOPY;  Service: Endoscopy;  Laterality: N/A;    Patient Active Problem List   Diagnosis Date Noted  . Allergic rhinitis 09/09/2013  . Edema 09/02/2013  . Acute bronchitis 09/02/2013  . Hypokalemia  06/18/2013  . Hypoalbuminemia 06/16/2013  . Left ankle pain 06/07/2013  . Acute blood loss anemia 04/17/2013  . Acute encephalopathy 04/17/2013  . GIB (gastrointestinal bleeding) 04/17/2013  . UGIB (upper gastrointestinal bleed) 04/16/2013  . Warfarin-induced coagulopathy 04/16/2013  .  Thrombocytopenia 11/30/2011  . Ejection fraction   . Edema leg   . OSA (obstructive sleep apnea) 09/16/2011  . CAD (coronary artery disease)   . Hx of CABG   . Hypertension   . Chronic systolic heart failure   . Mitral regurgitation   . Chronotropic incompetence   . Hyperlipemia   . Crohn's disease   . Ventral hernia   . Tremor   . Warfarin anticoagulation   . Carotid artery disease   . 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 IMPLANTABLE DEFIBRILLATOR, GDT CONTAK H170-MADIT-CRT 12/09/2009  . HYPERTROPHY PROSTATE W/UR OBST & OTH LUTS 03/31/2009  . VITAMIN B12 DEFICIENCY 11/06/2008  . VITAMIN D DEFICIENCY 11/06/2008  . CARDIOMYOPATHY, ISCHEMIC  s/p CABG   09/15/2008  . LBBB 06/25/2008  . FATIGUE 06/25/2008  . COLONIC POLYPS 03/23/2007  . POLYMYALGIA RHEUMATICA 03/23/2007  . ANXIETY 02/21/2007  . GERD 02/21/2007  . DEGENERATIVE JOINT DISEASE 02/21/2007    ROS   patient denies fever, chills, headache, sweats, rash, change in vision, change in hearing, chest pain, cough, nausea or vomiting, urinary symptoms. All other systems are reviewed and are negative.  PHYSICAL EXAM  patient is oriented to person time and place. Affect is normal. Head is atraumatic. Sclera and conjunctiva are normal. There is no jugular venous distention. Lungs are clear. Respiratory effort is nonlabored. Cardiac exam reveals an S1 and S2. The abdomen is protuberant. It is unchanged from the past. He usually wears a support else that has worn out. He does have trace peripheral edema.  Filed Vitals:   12/06/13 1129  BP: 128/72  Pulse: 63  Height: 6' (1.829 m)  Weight: 210 lb (95.255 kg)     ASSESSMENT & PLAN

## 2013-12-06 NOTE — Assessment & Plan Note (Signed)
At this time his fatigue does not appear to be cardiac in origin. No further workup.

## 2013-12-12 ENCOUNTER — Other Ambulatory Visit: Payer: Self-pay | Admitting: Pulmonary Disease

## 2013-12-12 ENCOUNTER — Other Ambulatory Visit: Payer: Self-pay | Admitting: Family Medicine

## 2013-12-22 ENCOUNTER — Other Ambulatory Visit: Payer: Self-pay | Admitting: Cardiology

## 2013-12-30 ENCOUNTER — Encounter: Payer: Self-pay | Admitting: Family Medicine

## 2013-12-30 ENCOUNTER — Ambulatory Visit (INDEPENDENT_AMBULATORY_CARE_PROVIDER_SITE_OTHER): Payer: Medicare Other

## 2013-12-30 ENCOUNTER — Ambulatory Visit (INDEPENDENT_AMBULATORY_CARE_PROVIDER_SITE_OTHER): Payer: Medicare Other | Admitting: Family Medicine

## 2013-12-30 VITALS — BP 127/57 | HR 61 | Temp 97.6°F | Ht 72.0 in | Wt 212.4 lb

## 2013-12-30 DIAGNOSIS — D696 Thrombocytopenia, unspecified: Secondary | ICD-10-CM | POA: Diagnosis not present

## 2013-12-30 DIAGNOSIS — K219 Gastro-esophageal reflux disease without esophagitis: Secondary | ICD-10-CM | POA: Diagnosis not present

## 2013-12-30 DIAGNOSIS — I2510 Atherosclerotic heart disease of native coronary artery without angina pectoris: Secondary | ICD-10-CM

## 2013-12-30 DIAGNOSIS — Z23 Encounter for immunization: Secondary | ICD-10-CM

## 2013-12-30 DIAGNOSIS — F411 Generalized anxiety disorder: Secondary | ICD-10-CM | POA: Diagnosis not present

## 2013-12-30 DIAGNOSIS — E785 Hyperlipidemia, unspecified: Secondary | ICD-10-CM | POA: Diagnosis not present

## 2013-12-30 DIAGNOSIS — D62 Acute posthemorrhagic anemia: Secondary | ICD-10-CM

## 2013-12-30 DIAGNOSIS — I2589 Other forms of chronic ischemic heart disease: Secondary | ICD-10-CM

## 2013-12-30 DIAGNOSIS — I10 Essential (primary) hypertension: Secondary | ICD-10-CM

## 2013-12-30 LAB — HEPATIC FUNCTION PANEL
ALT: 21 U/L (ref 0–53)
AST: 32 U/L (ref 0–37)
Albumin: 4.1 g/dL (ref 3.5–5.2)
Alkaline Phosphatase: 85 U/L (ref 39–117)
BILIRUBIN TOTAL: 2.8 mg/dL — AB (ref 0.2–1.2)
Bilirubin, Direct: 0.3 mg/dL (ref 0.0–0.3)
Total Protein: 6.5 g/dL (ref 6.0–8.3)

## 2013-12-30 LAB — RENAL FUNCTION PANEL
ALBUMIN: 4.1 g/dL (ref 3.5–5.2)
BUN: 10 mg/dL (ref 6–23)
CO2: 25 meq/L (ref 19–32)
Calcium: 9.1 mg/dL (ref 8.4–10.5)
Chloride: 102 mEq/L (ref 96–112)
Creatinine, Ser: 1.1 mg/dL (ref 0.4–1.5)
GFR: 72.12 mL/min (ref >60.00)
Glucose, Bld: 110 mg/dL — ABNORMAL HIGH (ref 70–99)
Phosphorus: 2.3 mg/dL (ref 2.3–4.6)
Potassium: 3.6 mEq/L (ref 3.5–5.1)
Sodium: 136 mEq/L (ref 135–145)

## 2013-12-30 LAB — CBC
HEMATOCRIT: 49.7 % (ref 39.0–52.0)
Hemoglobin: 16.3 g/dL (ref 13.0–17.0)
MCHC: 32.9 g/dL (ref 30.0–36.0)
MCV: 87.9 fl (ref 78.0–100.0)
Platelets: 75 10*3/uL — ABNORMAL LOW (ref 150.0–400.0)
RBC: 5.66 Mil/uL (ref 4.22–5.81)
RDW: 16.6 % — ABNORMAL HIGH (ref 11.5–15.5)
WBC: 5.6 10*3/uL (ref 4.0–10.5)

## 2013-12-30 LAB — LIPID PANEL
Cholesterol: 189 mg/dL (ref 0–200)
HDL: 42.3 mg/dL (ref >39.00)
NONHDL: 146.7
Total CHOL/HDL Ratio: 4
Triglycerides: 288 mg/dL — ABNORMAL HIGH (ref 0.0–149.0)
VLDL: 57.6 mg/dL — AB (ref 0.0–40.0)

## 2013-12-30 LAB — TSH: TSH: 2.17 u[IU]/mL (ref 0.35–4.50)

## 2013-12-30 LAB — LDL CHOLESTEROL, DIRECT: LDL DIRECT: 81.6 mg/dL

## 2013-12-30 NOTE — Patient Instructions (Signed)
Stretch the foot daily and try Salon Pas gel twice and some shoe inserts. Call if you would like a referral to podiatry    Plantar Fasciitis Plantar fasciitis is a common condition that causes foot pain. It is soreness (inflammation) of the band of tough fibrous tissue on the bottom of the foot that runs from the heel bone (calcaneus) to the ball of the foot. The cause of this soreness may be from excessive standing, poor fitting shoes, running on hard surfaces, being overweight, having an abnormal walk, or overuse (this is common in runners) of the painful foot or feet. It is also common in aerobic exercise dancers and ballet dancers. SYMPTOMS  Most people with plantar fasciitis complain of:  Severe pain in the morning on the bottom of their foot especially when taking the first steps out of bed. This pain recedes after a few minutes of walking.  Severe pain is experienced also during walking following a long period of inactivity.  Pain is worse when walking barefoot or up stairs DIAGNOSIS   Your caregiver will diagnose this condition by examining and feeling your foot.  Special tests such as X-rays of your foot, are usually not needed. PREVENTION   Consult a sports medicine professional before beginning a new exercise program.  Walking programs offer a good workout. With walking there is a lower chance of overuse injuries common to runners. There is less impact and less jarring of the joints.  Begin all new exercise programs slowly. If problems or pain develop, decrease the amount of time or distance until you are at a comfortable level.  Wear good shoes and replace them regularly.  Stretch your foot and the heel cords at the back of the ankle (Achilles tendon) both before and after exercise.  Run or exercise on even surfaces that are not hard. For example, asphalt is better than pavement.  Do not run barefoot on hard surfaces.  If using a treadmill, vary the incline.  Do not  continue to workout if you have foot or joint problems. Seek professional help if they do not improve. HOME CARE INSTRUCTIONS   Avoid activities that cause you pain until you recover.  Use ice or cold packs on the problem or painful areas after working out.  Only take over-the-counter or prescription medicines for pain, discomfort, or fever as directed by your caregiver.  Soft shoe inserts or athletic shoes with air or gel sole cushions may be helpful.  If problems continue or become more severe, consult a sports medicine caregiver or your own health care provider. Cortisone is a potent anti-inflammatory medication that may be injected into the painful area. You can discuss this treatment with your caregiver. MAKE SURE YOU:   Understand these instructions.  Will watch your condition.  Will get help right away if you are not doing well or get worse. Document Released: 10/12/2000 Document Revised: 04/11/2011 Document Reviewed: 12/12/2007 [facility] Patient Information 2015 [place], [place]. This information is not intended to replace advice given to you by your health care provider. Make sure you discuss any questions you have with your health care provider.

## 2013-12-30 NOTE — Progress Notes (Signed)
[name]  [id number] 06-11-1931 12/30/2013      Progress Note-Follow Up  Subjective  Chief Complaint  Chief Complaint  Patient presents with  . Follow-up    3 month  . Injections    flu    HPI  Patient is a 80 y.o. male in today for routine medical care. Patient is in today with family. Continues to struggle with abdominal pain, reflux and fatigue. No recent acute illness. No f/c, no bloody or tarry stool. Denies CP/palp/SOB/HA/congestion or GU c/o. Taking meds as prescribed  Past Medical History  Diagnosis Date  . Dizziness     Positional dizziness  . Hypertension   . Ischemic cardiomyopathy     CABG 1994 CAD catherization 1996.[name] occluded vein graft to the diagnol, other grafts patent/  catherization 2006, no PCI/ nuclear.[name] october 2011.[name] extensive scar anteroseptal and apical.. no ischemia    . CHF (congestive heart failure)     chronic systolic  . Mitral regurgitation     mild.[name] echo november 2011  EF 35%...echo.. november 2009/ ef 35%.[name] echo november 2011  . Sinus bradycardia   . Chronotropic incompetence   . Biventricular implantable cardiac defibrillator in situ     GDT CONTAK H170-MADIT-CRT-EXPLANTED 2011 implanted defibrillator- [employer] cognis, model n119-2001 Dr. Caryl Comes (11/28/2009)  . LBBB (left bundle branch block)   . Hyperlipemia   . GERD (gastroesophageal reflux disease)   . Colonic polyp 2010    pathology not clear.   . Crohn's disease   . Acute vascular insufficiency of intestine     Mesenteric embolus...surgery 2003  . Ventral hernia   . Hypertrophy of prostate with urinary obstruction and other lower urinary tract symptoms (LUTS)   . Degenerative joint disease   . Polymyalgia rheumatica   . Tremor   . Anxiety   . Shingles   . Vitamin B12 deficiency   . CAD (coronary artery disease)     CABG 1994  /   catheterization 1996, occluded vein graft to the diagonal, other grafts patent  /   catheterization 2006, no PCI  /  nuclear, October, 2011,  extensive scar anteroseptal and apical, no ischemia  . Hx of CABG     1994  . Ejection fraction     EF 35%, echo, November, 2011  //   Is 35-40%, echo,  September 23, 2011  . Warfarin anticoagulation     Coumadin therapy for mesenteric embolus 2003  . Carotid artery disease     Doppler, February,  2012, 0-39% bilateral, recommended followup 1 year  . Edema leg     Venous Dopplers 8/13: No DVT bilaterally  . Breast tenderness     Spironolactone was stopped and eplerinone started. Patient feels better  . Thrombocytopenia 2008  . Edema 09/02/2013  . Acute bronchitis 09/02/2013    Past Surgical History  Procedure Laterality Date  . Coronary artery bypass graft  1994  . Laparoscopic cholecystectomy  2002  . Elap w/ superior mesenteric artery embolectomy  1/03    by Dr. Jennette Banker  . Repair of incarcerated ventral hernia and lysis of adhesions  11/03    by Dr. Betsy Pries  . Implantation of biventric cardioverter-defibrillatorr      by Dr. Lovena Le in 8/06 [employer] [place] 2011  . Implantation of guidant cognis device      model [id number]  . Left inguinal hernia repair with mesh  6/08    by Dr. Betsy Pries  . Prostate surgery  08/2012  by urology in [place]  . Esophagogastroduodenoscopy N/A 04/18/2013    Procedure: ESOPHAGOGASTRODUODENOSCOPY (EGD);  Surgeon: Jerene Bears, MD;  Location: [facility] ENDOSCOPY;  Service: Endoscopy;  Laterality: N/A;  . Colonoscopy N/A 04/19/2013    Procedure: COLONOSCOPY;  Surgeon: Jerene Bears, MD;  Location: [facility] ENDOSCOPY;  Service: Endoscopy;  Laterality: N/A;    Family History  Problem Relation Age of Onset  . Heart disease Mother   . Hypertension    . Hyperlipidemia    . Tremor Sister   . Heart disease Sister     heart failure and disease  . Heart disease Father 69    Deceased   . Hypertension Father   . Appendicitis Brother     ruptured  . Diabetes Son   . Heart disease Sister   . Hypertension Sister   . Dementia Sister   . Heart disease Sister      pedal edema  . Hypertension Brother   . Kidney disease Brother   . Myasthenia gravis Brother   . Cirrhosis Brother   . Cancer Brother     leukemia    History   Social History  . Marital Status: Married    Spouse Name: Kennyth Lose    Number of Children: 3  . 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 Smoking status: Former Smoker -- 1.00 packs/day for 20 years    Types: Cigarettes    Quit date: 01/31/1973  . Smokeless tobacco: Never Used  . Alcohol Use: Yes     Comment: occasional  . Drug Use: No  . Sexual Activity: No     Comment: lives with wife, no dietary restrictions   Other Topics Concern  . Not on file   Social History Narrative   Lives in [place], [place] with wife.     Current Outpatient Prescriptions on File Prior to Visit  Medication Sig Dispense Refill  . carvedilol (COREG) 6.25 MG tablet Take 1 tablet by mouth  twice a day with meals 180 tablet 3  . cholecalciferol (VITAMIN D) 1000 UNITS tablet Take 1,000 Units by mouth daily.     . Cyanocobalamin (B-12) 1000 MCG CAPS Take 1,000 mcg by mouth daily.     . furosemide (LASIX) 40 MG tablet Take 1 tablet (40 mg total) by mouth 2 (two) times daily. 180 tablet 1  . KLOR-CON M20 20 MEQ tablet TAKE 1 TABLET (20 MEQ TOTAL) BY MOUTH DAILY. 30 tablet 1  . lisinopril (PRINIVIL,ZESTRIL) 10 MG tablet Take 1 tablet (10 mg total) by mouth daily. 90 tablet 3  . pantoprazole (PROTONIX) 40 MG tablet TAKE 1 TABLET EVERY DAY AT 12 NOON 30 tablet 5  . simvastatin (ZOCOR) 20 MG tablet Take 1 tablet by mouth at  bedtime 90 tablet 2  . warfarin (COUMADIN) 5 MG tablet Take as directed by  anticoagulation clinic 150 tablet 1   No current facility-administered medications on file prior to visit.    No Known Allergies  Review of Systems  Review of Systems  Constitutional: Positive for malaise/fatigue. Negative for fever.  HENT: Negative for  congestion.   Eyes: Negative for discharge.  Respiratory: Negative for shortness of breath.   Cardiovascular: Negative for chest pain, palpitations and leg swelling.  Gastrointestinal: Positive for heartburn and abdominal pain. Negative for nausea and diarrhea.  Genitourinary: Negative for dysuria.  Musculoskeletal: Positive for  joint pain. Negative for falls.  Skin: Negative for rash.  Neurological: Negative for loss of consciousness and headaches.  Endo/Heme/Allergies: Negative for polydipsia.  Psychiatric/Behavioral: Negative for depression and suicidal ideas. The patient is not nervous/anxious and does not have insomnia.     Objective  BP 127/57 mmHg  Pulse 61  Temp(Src) 97.6 F (36.4 C) (Oral)  Ht 6' (1.829 m)  Wt 212 lb 6.4 oz (96.344 kg)  BMI 28.80 kg/m2  SpO2 94%  Physical Exam  Physical Exam  Constitutional: He is oriented to person, place, and time and well-developed, well-nourished, and in no distress. No distress.  HENT:  Head: Normocephalic and atraumatic.  Eyes: Conjunctivae are normal.  Neck: Neck supple. No thyromegaly present.  Cardiovascular: Normal rate, regular rhythm and normal heart sounds.   No murmur heard. Pulmonary/Chest: Effort normal and breath sounds normal. No respiratory distress.  Abdominal: He exhibits no distension and no mass. There is no tenderness.  Musculoskeletal: He exhibits no edema.  Neurological: He is alert and oriented to person, place, and time.  Skin: Skin is warm.  Psychiatric: Memory, affect and judgment normal.    Lab Results  Component Value Date   TSH 1.59 03/11/2013   Lab Results  Component Value Date   WBC 4.6 11/26/2013   HGB 15.2 11/26/2013   HCT 46.6 11/26/2013   MCV 84.5 11/26/2013   PLT 73.0* 11/26/2013   Lab Results  Component Value Date   CREATININE 1.1 09/04/2013   BUN 11 09/04/2013   NA 137 09/04/2013   K 3.8 09/04/2013   CL 98 09/04/2013   CO2 32 09/04/2013   Lab Results  Component Value  Date   ALT 11 06/10/2013   AST 22 06/10/2013   ALKPHOS 76 06/10/2013   BILITOT 2.0* 06/10/2013   Lab Results  Component Value Date   CHOL 170 03/11/2013   Lab Results  Component Value Date   HDL 60.90 03/11/2013   Lab Results  Component Value Date   LDLCALC 74 03/11/2013   Lab Results  Component Value Date   TRIG 175.0* 03/11/2013   Lab Results  Component Value Date   CHOLHDL 3 03/11/2013     Assessment & Plan  Hypertension Well controlled, no changes to meds. Encouraged heart healthy diet such as the DASH diet and exercise as tolerated.   GERD Avoid offending foods, start probiotics. Do not eat large meals in late evening and consider raising head of bed. Continues to struggle with dyspepsia, abdominal discomfort and elevated Bilirubin, imaging studies suggest possible Cirrhosis. Will have him evaluated  By Gastroenterology to hear options and consider further work up.   Hyperlipemia Tolerating Zetia and Simvastatin, encouraged heart healthy diet, avoid trans fats, minimize simple carbs and saturated fats. Increase exercise as tolerated  Anxiety state Improved with Sertraline. Continue the same  Acute blood loss anemia resolved  CAD (coronary artery disease) Asymptomatic at this time.

## 2013-12-30 NOTE — Progress Notes (Signed)
Pre visit review using our clinic review tool, if applicable. No additional management support is needed unless otherwise documented below in the visit note. 

## 2013-12-31 ENCOUNTER — Other Ambulatory Visit: Payer: Self-pay | Admitting: Family Medicine

## 2013-12-31 DIAGNOSIS — R109 Unspecified abdominal pain: Secondary | ICD-10-CM

## 2013-12-31 DIAGNOSIS — R17 Unspecified jaundice: Secondary | ICD-10-CM

## 2014-01-01 ENCOUNTER — Encounter: Payer: Self-pay | Admitting: Cardiology

## 2014-01-02 ENCOUNTER — Ambulatory Visit (HOSPITAL_BASED_OUTPATIENT_CLINIC_OR_DEPARTMENT_OTHER)
Admission: RE | Admit: 2014-01-02 | Discharge: 2014-01-02 | Disposition: A | Discharge status: Home / Self Care | Payer: Medicare Other | Source: Ambulatory Visit | Attending: Family Medicine | Admitting: Family Medicine

## 2014-01-02 DIAGNOSIS — K439 Ventral hernia without obstruction or gangrene: Secondary | ICD-10-CM | POA: Diagnosis not present

## 2014-01-02 DIAGNOSIS — R1084 Generalized abdominal pain: Secondary | ICD-10-CM | POA: Diagnosis not present

## 2014-01-02 DIAGNOSIS — N281 Cyst of kidney, acquired: Secondary | ICD-10-CM | POA: Diagnosis not present

## 2014-01-02 DIAGNOSIS — R748 Abnormal levels of other serum enzymes: Secondary | ICD-10-CM | POA: Insufficient documentation

## 2014-01-02 DIAGNOSIS — R109 Unspecified abdominal pain: Secondary | ICD-10-CM

## 2014-01-02 DIAGNOSIS — R161 Splenomegaly, not elsewhere classified: Secondary | ICD-10-CM | POA: Diagnosis not present

## 2014-01-02 DIAGNOSIS — Z9049 Acquired absence of other specified parts of digestive tract: Secondary | ICD-10-CM | POA: Insufficient documentation

## 2014-01-02 DIAGNOSIS — R17 Unspecified jaundice: Secondary | ICD-10-CM

## 2014-01-03 ENCOUNTER — Telehealth: Payer: Self-pay | Admitting: Internal Medicine

## 2014-01-03 NOTE — Telephone Encounter (Signed)
Pt has US done that was ordered by pts PCP. Pt was told to follow-up with Dr. Hilarie Fredrickson asap because he may have cirrhosis. Pt scheduled to see Dr. Hilarie Fredrickson 01/09/14@10 :15am. Pt aware of appt.

## 2014-01-05 NOTE — Assessment & Plan Note (Signed)
resolved 

## 2014-01-05 NOTE — Assessment & Plan Note (Addendum)
Tolerating Zetia and Simvastatin, encouraged heart healthy diet, avoid trans fats, minimize simple carbs and saturated fats. Increase exercise as tolerated

## 2014-01-05 NOTE — Assessment & Plan Note (Signed)
Well controlled, no changes to meds. Encouraged heart healthy diet such as the DASH diet and exercise as tolerated.  °

## 2014-01-05 NOTE — Assessment & Plan Note (Signed)
Asymptomatic at this time 

## 2014-01-05 NOTE — Assessment & Plan Note (Addendum)
Avoid offending foods, start probiotics. Do not eat large meals in late evening and consider raising head of bed. Continues to struggle with dyspepsia, abdominal discomfort and elevated Bilirubin, imaging studies suggest possible Cirrhosis. Will have him evaluated  By Gastroenterology to hear options and consider further work up.

## 2014-01-05 NOTE — Assessment & Plan Note (Signed)
Improved with Sertraline. Continue the same

## 2014-01-06 ENCOUNTER — Encounter: Payer: Self-pay | Admitting: Internal Medicine

## 2014-01-07 ENCOUNTER — Ambulatory Visit (INDEPENDENT_AMBULATORY_CARE_PROVIDER_SITE_OTHER): Payer: Medicare Other | Admitting: Pharmacist

## 2014-01-07 DIAGNOSIS — K550 Acute vascular disorders of intestine: Secondary | ICD-10-CM | POA: Diagnosis not present

## 2014-01-07 DIAGNOSIS — K55069 Acute infarction of intestine, part and extent unspecified: Secondary | ICD-10-CM

## 2014-01-07 LAB — POCT INR: INR: 2.3

## 2014-01-09 ENCOUNTER — Other Ambulatory Visit: Payer: Medicare Other

## 2014-01-09 ENCOUNTER — Ambulatory Visit (INDEPENDENT_AMBULATORY_CARE_PROVIDER_SITE_OTHER): Payer: Medicare Other | Admitting: Internal Medicine

## 2014-01-09 ENCOUNTER — Encounter: Payer: Self-pay | Admitting: Internal Medicine

## 2014-01-09 VITALS — BP 120/76 | HR 68 | Ht 72.0 in | Wt 211.4 lb

## 2014-01-09 DIAGNOSIS — R945 Abnormal results of liver function studies: Secondary | ICD-10-CM

## 2014-01-09 DIAGNOSIS — R932 Abnormal findings on diagnostic imaging of liver and biliary tract: Secondary | ICD-10-CM | POA: Diagnosis not present

## 2014-01-09 DIAGNOSIS — R7989 Other specified abnormal findings of blood chemistry: Secondary | ICD-10-CM | POA: Diagnosis not present

## 2014-01-09 DIAGNOSIS — R799 Abnormal finding of blood chemistry, unspecified: Secondary | ICD-10-CM | POA: Diagnosis not present

## 2014-01-09 DIAGNOSIS — I2589 Other forms of chronic ischemic heart disease: Secondary | ICD-10-CM

## 2014-01-09 DIAGNOSIS — D696 Thrombocytopenia, unspecified: Secondary | ICD-10-CM

## 2014-01-09 DIAGNOSIS — R195 Other fecal abnormalities: Secondary | ICD-10-CM | POA: Diagnosis not present

## 2014-01-09 LAB — HEPATITIS B SURFACE ANTIGEN: Hepatitis B Surface Ag: NEGATIVE

## 2014-01-09 LAB — HEPATITIS B CORE ANTIBODY, IGM: Hep B C IgM: NONREACTIVE

## 2014-01-09 LAB — HEPATITIS C ANTIBODY: HCV AB: NEGATIVE

## 2014-01-09 LAB — HEPATITIS B SURFACE ANTIBODY,QUALITATIVE: HEP B S AB: POSITIVE — AB

## 2014-01-09 LAB — HEPATITIS B CORE ANTIBODY, TOTAL: HEP B C TOTAL AB: REACTIVE — AB

## 2014-01-09 NOTE — Progress Notes (Signed)
Subjective:    Patient ID: BRACH BIRDSALL, male    DOB: September 08, 1931, 80 y.o.   MRN: [id number]  HPI [name] is an 80 year old man with a history of Crohn's enteritis status post resection, resultant ventral hernia, hospitalization for occult GI bleeding in March 2015, ischemic cardia myopathy on warfarin, history of iron deficiency anemia, and recent abdominal ultrasound raising the question of cirrhosis who is seen in follow-up. He is here today with his wife. I saw him in October 2015 and he was recently seen by primary care. Abdominal ultrasound was performed and raised the question of nodular liver and underlying cirrhosis.  His wife is been worried about this since finding out the ultrasound report. The patient reports overall he feels fairly well. His appetite fluctuates. His bowel movements have been okay though he can have 2-3 days a week where he has loose stools at midday and afternoon. No diarrhea nocturnal he. No blood in his stool or melena. He denies nausea or vomiting. No heartburn. He does report occasional abdominal discomfort which can be on his left side which he feels is related to his left hip. He has taken pantoprazole 40 mg before lunch.  He has never been told he has cirrhosis until recent imaging. Family history notable for a brother alcoholic cirrhosis. He very rarely drinks alcohol and denies heavy alcohol abuse in the past. History of jaundice. No itching. He does have issues with lower extremity swelling and he is taking Lasix twice daily. No abdominal increased girth but he does wear an abdominal binder due to his history of ventral hernia and multiple abdominal surgeries. His daughter has fatty liver.  Review of Systems As per history of present illness, otherwise negative  Current Medications, Allergies, Past Medical History, Past Surgical History, Family History and Social History were reviewed in [employer] record.     Objective:   Physical Exam BP 120/76 mmHg  Pulse 68  Ht 6' (1.829 m)  Wt 211 lb 6.4 oz (95.89 kg)  BMI 28.66 kg/m2 Constitutional: Well-developed and well-nourished. No distress. HEENT: Normocephalic and atraumatic. Oropharynx is clear and moist. No oropharyngeal exudate. Conjunctivae are normal.  No scleral icterus. Neck: Neck supple. Trachea midline. Cardiovascular: Normal rate, regular rhythm and intact distal pulses. No M/R/G Pulmonary/chest: Effort normal and breath sounds normal. No wheezing, rales or rhonchi. Abdominal: Soft, nontender, nondistended. Bowel sounds active throughout. There are no masses palpable. No hepatosplenomegaly. Extremities: no clubbing, cyanosis, or edema Lymphadenopathy: No cervical adenopathy noted. Neurological: Alert and oriented to person place and time. Skin: Skin is warm and dry. No rashes noted. Psychiatric: Normal mood and affect. Behavior is normal.  CBC    Component Value Date/Time   WBC 5.6 12/30/2013 1213   RBC 5.66 12/30/2013 1213   HGB 16.3 12/30/2013 1213   HCT 49.7 12/30/2013 1213   PLT 75.0* 12/30/2013 1213   MCV 87.9 12/30/2013 1213   MCH 21.6* 08/30/2013 1500   MCHC 32.9 12/30/2013 1213   RDW 16.6* 12/30/2013 1213   LYMPHSABS 1.6 11/26/2013 1108   MONOABS 0.7 11/26/2013 1108   EOSABS 0.1 11/26/2013 1108   BASOSABS 0.0 11/26/2013 1108    CMP     Component Value Date/Time   NA 136 12/30/2013 1213   K 3.6 12/30/2013 1213   CL 102 12/30/2013 1213   CO2 25 12/30/2013 1213   GLUCOSE 110* 12/30/2013 1213   BUN 10 12/30/2013 1213   CREATININE 1.1 12/30/2013 1213   CREATININE 1.09  06/10/2013 1228   CALCIUM 9.1 12/30/2013 1213   PROT 6.5 12/30/2013 1213   ALBUMIN 4.1 12/30/2013 1213   ALBUMIN 4.1 12/30/2013 1213   AST 32 12/30/2013 1213   ALT 21 12/30/2013 1213   ALKPHOS 85 12/30/2013 1213   BILITOT 2.8* 12/30/2013 1213   GFRNONAA 78* 04/22/2013 0643   GFRAA >90 04/22/2013 0643   ULTRASOUND ABDOMEN COMPLETE   COMPARISON:  CT on  02/15/2013   FINDINGS: Gallbladder: Surgically absent.   Common bile duct: Diameter: 4 mm   Liver: Coarse echotexture and mild capsular nodularity suspicious for hepatic cirrhosis. No focal liver mass visualized.   IVC: Not well visualized   Pancreas: Not well visualized due to overlying bowel gas.   Spleen: Splenomegaly, with length measuring approximately 14 mm and estimated volume of 721 mL.   Right Kidney: Length: 11.2 cm. Diffusely increased parenchymal echogenicity. 1.7 cm cyst in upper pole. No mass or hydronephrosis visualized.   Left Kidney: Length: Could not be accurately measured. Not well visualized.   Abdominal aorta: Obscured by patient habitus and overlying bowel gas.   Other findings: None.   IMPRESSION: Technically suboptimal exam due to large anterior abdominal wall hernia and bowel gas.   Prior cholecystectomy.  No evidence of biliary ductal dilatation.   Probable hepatic cirrhosis. Splenomegaly may be secondary to portal venous hypertension.   Increased renal parenchymal echogenicity, consistent with medical renal disease. No evidence of hydronephrosis. Left kidney not well visualized on this study.     Electronically Signed   By: Earle Gell M.D.   On: 01/02/2014 12:57   CT scan Nov 2014 done at urology -- no mention of cirrhosis or liver nodularity.  EGD 2015 - no varices or PHG    Assessment & Plan:  80 year old man with a history of Crohn's enteritis status post resection, resultant ventral hernia, hospitalization for occult GI bleeding in March 2015, ischemic cardia myopathy on warfarin, history of iron deficiency anemia, and recent abdominal ultrasound raising the question of cirrhosis who is seen in follow-up.  1. ? Cirrhosis -- ultrasound of the liver suggests nodularity. He does have a chronic thrombocytopenia. His bilirubin is also mildly elevated, most recently at 2.8. These argue for the possibility of cirrhosis. He has a history  of Crohn's disease, and there is an association with PSC, though his alkaline phosphatase is normal.  No evidence for decompensated liver disease, no evidence of HCC ultrasound and no varices on EGD earlier this year. --Fatty liver is most likely cause if he does indeed have cirrhosis. We'll check for other possible causes as well --FibroSure blood test today --Bilateral hepatitis serologies, AMA, ANA, IgG, anti-smooth muscle, ceruloplasmin, alpha-1 --Low sodium diet recommended --Up-to-date on screening, follow-up in 3 months --We discussed how he may indeed have cirrhosis but liver appears well compensated at present. This will need to be monitored going forward  2. Loose stools -- intermittent. I recommended Imodium 2-4 mg on the days he is having loose stools. If this worsens he should notify me. No evidence of active Crohn's disease at colonoscopy earlier this year.

## 2014-01-09 NOTE — Patient Instructions (Signed)
Your physician has requested that you go to the basement for lab work before leaving today  Make sure you are taking your PPI early in the morning.  Use Imodium on loose stool days.  Please follow up with Dr. Henrene Pastor in 3 months

## 2014-01-10 LAB — MITOCHONDRIAL ANTIBODIES: Mitochondrial M2 Ab, IgG: 0.25 (ref <0.91)

## 2014-01-10 LAB — IGG: IGG (IMMUNOGLOBIN G), SERUM: 795 mg/dL (ref 650–1600)

## 2014-01-10 LAB — ANA: Anti Nuclear Antibody(ANA): NEGATIVE

## 2014-01-10 LAB — ANTI-SMOOTH MUSCLE ANTIBODY, IGG: Smooth Muscle Ab: 12 U (ref <20)

## 2014-01-13 ENCOUNTER — Other Ambulatory Visit: Payer: Self-pay | Admitting: Cardiology

## 2014-01-13 ENCOUNTER — Other Ambulatory Visit: Payer: Self-pay | Admitting: *Deleted

## 2014-01-13 LAB — CERULOPLASMIN: Ceruloplasmin: 24 mg/dL (ref 18–36)

## 2014-01-13 LAB — ALPHA-1-ANTITRYPSIN: A1 ANTITRYPSIN SER: 135 mg/dL (ref 83–199)

## 2014-01-13 MED ORDER — PANTOPRAZOLE SODIUM 40 MG PO TBEC: DELAYED_RELEASE_TABLET | ORAL | Status: DC

## 2014-01-13 MED ORDER — SIMVASTATIN 20 MG PO TABS: ORAL_TABLET | ORAL | Status: DC

## 2014-01-14 LAB — LIVER FIBROSIS, FIBROTEST-ACTITEST
ALT: 14 U/L (ref 9–46)
APOLIPOPROTEIN A1: 175 mg/dL (ref 94–176)
Alpha-2-Macroglobulin: 419 mg/dL — ABNORMAL HIGH (ref 106–279)
Bilirubin: 1.6 mg/dL — ABNORMAL HIGH (ref 0.2–1.2)
Fibrosis Score: 0.92
GGT: 46 U/L (ref 3–70)
HAPTOGLOBIN: 41 mg/dL — AB (ref 43–212)
NECROINFLAMMAT ACT SCORE: 0.11
REFERENCE ID: 1085272

## 2014-01-16 ENCOUNTER — Other Ambulatory Visit: Payer: Self-pay | Admitting: *Deleted

## 2014-01-16 MED ORDER — POTASSIUM CHLORIDE CRYS ER 20 MEQ PO TBCR: EXTENDED_RELEASE_TABLET | ORAL | Status: DC

## 2014-02-18 ENCOUNTER — Ambulatory Visit (INDEPENDENT_AMBULATORY_CARE_PROVIDER_SITE_OTHER): Payer: Medicare Other | Admitting: Pharmacist

## 2014-02-18 DIAGNOSIS — K550 Acute vascular disorders of intestine: Secondary | ICD-10-CM | POA: Diagnosis not present

## 2014-02-18 DIAGNOSIS — K55069 Acute infarction of intestine, part and extent unspecified: Secondary | ICD-10-CM

## 2014-02-18 LAB — POCT INR: INR: 2.9

## 2014-02-24 ENCOUNTER — Ambulatory Visit: Payer: Medicare Other | Admitting: Internal Medicine

## 2014-02-25 DIAGNOSIS — N210 Calculus in bladder: Secondary | ICD-10-CM | POA: Diagnosis not present

## 2014-02-25 DIAGNOSIS — N3090 Cystitis, unspecified without hematuria: Secondary | ICD-10-CM | POA: Diagnosis not present

## 2014-02-25 DIAGNOSIS — N5203 Combined arterial insufficiency and corporo-venous occlusive erectile dysfunction: Secondary | ICD-10-CM | POA: Diagnosis not present

## 2014-02-25 DIAGNOSIS — R351 Nocturia: Secondary | ICD-10-CM | POA: Diagnosis not present

## 2014-02-25 DIAGNOSIS — N401 Enlarged prostate with lower urinary tract symptoms: Secondary | ICD-10-CM | POA: Diagnosis not present

## 2014-03-03 ENCOUNTER — Other Ambulatory Visit: Payer: Self-pay | Admitting: Cardiology

## 2014-03-10 ENCOUNTER — Ambulatory Visit (INDEPENDENT_AMBULATORY_CARE_PROVIDER_SITE_OTHER): Payer: Medicare Other | Admitting: *Deleted

## 2014-03-10 ENCOUNTER — Ambulatory Visit (INDEPENDENT_AMBULATORY_CARE_PROVIDER_SITE_OTHER): Payer: Medicare Other | Admitting: Cardiology

## 2014-03-10 ENCOUNTER — Encounter: Payer: Self-pay | Admitting: Cardiology

## 2014-03-10 VITALS — BP 130/80 | HR 62 | Ht 72.0 in | Wt 209.0 lb

## 2014-03-10 DIAGNOSIS — I5022 Chronic systolic (congestive) heart failure: Secondary | ICD-10-CM | POA: Diagnosis not present

## 2014-03-10 DIAGNOSIS — J329 Chronic sinusitis, unspecified: Secondary | ICD-10-CM | POA: Insufficient documentation

## 2014-03-10 DIAGNOSIS — J0100 Acute maxillary sinusitis, unspecified: Secondary | ICD-10-CM | POA: Diagnosis not present

## 2014-03-10 DIAGNOSIS — I255 Ischemic cardiomyopathy: Secondary | ICD-10-CM

## 2014-03-10 MED ORDER — AMPICILLIN 500 MG PO CAPS: 500.0000 mg | ORAL_CAPSULE | Freq: Three times a day (TID) | ORAL | Status: DC

## 2014-03-10 NOTE — Assessment & Plan Note (Signed)
The patient's major complaint today appears to be left maxillary sinusitis. I have started him on ampicillin 500 mg 3 times daily for 10 days. If he is not improved after that, he will follow-up with his primary team.

## 2014-03-10 NOTE — Patient Instructions (Addendum)
**Note De-Identified  Obfuscation** Your physician has recommended you make the following change in your medication: start taking Ampicillin 500 mg three times a day for 10 days  Your physician recommends that you schedule a follow-up appointment in: 3 months

## 2014-03-10 NOTE — Progress Notes (Signed)
Remote ICD transmission.   

## 2014-03-10 NOTE — Assessment & Plan Note (Signed)
His meds have been titrated. No change in therapy.

## 2014-03-10 NOTE — Assessment & Plan Note (Signed)
His volume status is stable. No change in therapy.

## 2014-03-10 NOTE — Progress Notes (Signed)
Cardiology Office Note   Date:  03/10/2014   ID:  Trigger, [name] 01-Jun-1931, MRN [id number]  PCP:  Penni Homans, MD  Cardiologist:  Dola Argyle, MD   Chief Complaint  Patient presents with  . Appointment    Follow-up cardiomyopathy      History of Present Illness: [name] is a 81 y.o. male who presents today to follow-up his cardiomyopathy. Recently he is being evaluated for cirrhosis. There is some question that this could be cardiac cirrhosis. His volume status is stable.  He mentions to me today that he is having continued symptoms of discomfort in the left maxillary sinus area with some nasal discharge.    Past Medical History  Diagnosis Date  . Dizziness     Positional dizziness  . Hypertension   . Ischemic cardiomyopathy     CABG 1994 CAD catherization 1996.[name] occluded vein graft to the diagnol, other grafts patent/  catherization 2006, no PCI/ nuclear.[name] october 2011.[name] extensive scar anteroseptal and apical.. no ischemia    . CHF (congestive heart failure)     chronic systolic  . Mitral regurgitation     mild.[name] echo november 2011  EF 35%...echo.. november 2009/ ef 35%.[name] echo november 2011  . Sinus bradycardia   . Chronotropic incompetence   . Biventricular implantable cardiac defibrillator in situ     GDT CONTAK H170-MADIT-CRT-EXPLANTED 2011 implanted defibrillator- [employer] cognis, model n119-2001 Dr. Caryl Comes (11/28/2009)  . LBBB (left bundle branch block)   . Hyperlipemia   . GERD (gastroesophageal reflux disease)   . Colonic polyp 2010    pathology not clear.   . Crohn's disease   . Acute vascular insufficiency of intestine     Mesenteric embolus...surgery 2003  . Ventral hernia   . Hypertrophy of prostate with urinary obstruction and other lower urinary tract symptoms (LUTS)   . Degenerative joint disease   . Polymyalgia rheumatica   . Tremor   . Anxiety   . Shingles   . Vitamin B12 deficiency   . CAD (coronary artery disease)     CABG 1994   /   catheterization 1996, occluded vein graft to the diagonal, other grafts patent  /   catheterization 2006, no PCI  /  nuclear, October, 2011, extensive scar anteroseptal and apical, no ischemia  . Hx of CABG     1994  . Ejection fraction     EF 35%, echo, November, 2011  //   Is 35-40%, echo,  September 23, 2011  . Warfarin anticoagulation     Coumadin therapy for mesenteric embolus 2003  . Carotid artery disease     Doppler, February,  2012, 0-39% bilateral, recommended followup 1 year  . Edema leg     Venous Dopplers 8/13: No DVT bilaterally  . Breast tenderness     Spironolactone was stopped and eplerinone started. Patient feels better  . Thrombocytopenia 2008  . Edema 09/02/2013  . Acute bronchitis 09/02/2013  . Iron deficiency anemia   . Thrush     Past Surgical History  Procedure Laterality Date  . Coronary artery bypass graft  1994  . Laparoscopic cholecystectomy  2002  . Elap w/ superior mesenteric artery embolectomy  1/03    by Dr. Jennette Banker  . Repair of incarcerated ventral hernia and lysis of adhesions  11/03    by Dr. Betsy Pries  . Implantation of biventric cardioverter-defibrillatorr      by Dr. Lovena Le in 8/06 [employer] [place] 2011  . Implantation  of guidant cognis device      model [id number]  . Left inguinal hernia repair with mesh  6/08    by Dr. Betsy Pries  . Prostate surgery  08/2012    by urology in [place]  . Esophagogastroduodenoscopy N/A 04/18/2013    Procedure: ESOPHAGOGASTRODUODENOSCOPY (EGD);  Surgeon: Jerene Bears, MD;  Location: [facility] ENDOSCOPY;  Service: Endoscopy;  Laterality: N/A;  . Colonoscopy N/A 04/19/2013    Procedure: COLONOSCOPY;  Surgeon: Jerene Bears, MD;  Location: [facility] ENDOSCOPY;  Service: Endoscopy;  Laterality: N/A;    Patient Active Problem List   Diagnosis Date Noted  . Sinusitis 03/10/2014  . Allergic rhinitis 09/09/2013  . Acute bronchitis 09/02/2013  . Hypokalemia 06/18/2013  . Hypoalbuminemia 06/16/2013  . Left ankle pain  06/07/2013  . Acute blood loss anemia 04/17/2013  . Acute encephalopathy 04/17/2013  . GIB (gastrointestinal bleeding) 04/17/2013  . UGIB (upper gastrointestinal bleed) 04/16/2013  . Warfarin-induced coagulopathy 04/16/2013  . Thrombocytopenia 11/30/2011  . Ejection fraction   . Edema leg   . OSA (obstructive sleep apnea) 09/16/2011  . CAD (coronary artery disease)   . Hx of CABG   . Hypertension   . Chronic systolic heart failure   . Mitral regurgitation   . Chronotropic incompetence   . Hyperlipemia   . Crohn's disease   . Ventral hernia   . Tremor   . Warfarin anticoagulation   . Carotid artery disease   . 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 IMPLANTABLE DEFIBRILLATOR, GDT CONTAK H170-MADIT-CRT 12/09/2009  . HYPERTROPHY PROSTATE W/UR OBST & OTH LUTS 03/31/2009  . VITAMIN B12 DEFICIENCY 11/06/2008  . VITAMIN D DEFICIENCY 11/06/2008  . CARDIOMYOPATHY, ISCHEMIC  s/p CABG   09/15/2008  . LBBB 06/25/2008  . Fatigue 06/25/2008  . COLONIC POLYPS 03/23/2007  . POLYMYALGIA RHEUMATICA 03/23/2007  . Anxiety state 02/21/2007  . GERD 02/21/2007  . DEGENERATIVE JOINT DISEASE 02/21/2007      Current Outpatient Prescriptions  Medication Sig Dispense Refill  . carvedilol (COREG) 6.25 MG tablet Take 1 tablet by mouth  twice a day with meals 180 tablet 3  . cholecalciferol (VITAMIN D) 1000 UNITS tablet Take 1,000 Units by mouth daily.     . Cyanocobalamin (B-12) 1000 MCG CAPS Take 1,000 mcg by mouth daily.     . furosemide (LASIX) 40 MG tablet Take 1 tablet (40 mg total) by mouth 2 (two) times daily. 180 tablet 1  . KLOR-CON M20 20 MEQ tablet TAKE 1 TABLET BY MOUTH DAILY. 30 tablet 1  . lisinopril (PRINIVIL,ZESTRIL) 10 MG tablet Take 1 tablet (10 mg total) by mouth daily. 90 tablet 3  . pantoprazole (PROTONIX) 40 MG tablet TAKE 1 TABLET EVERY DAY AT 12 NOON 90 tablet 1  . simvastatin (ZOCOR) 20 MG tablet Take 1 tablet by mouth at  bedtime 90 tablet 1  . warfarin (COUMADIN) 5 MG tablet  Take as directed by  anticoagulation clinic 150 tablet 1  . ampicillin (PRINCIPEN) 500 MG capsule Take 1 capsule (500 mg total) by mouth 3 (three) times daily. 30 capsule 0   No current facility-administered medications for this visit.    Allergies:   Review of patient's allergies indicates no known allergies.    Social History:  The patient  reports that he quit smoking about 41 years ago. His smoking use included Cigarettes. He has a 20 pack-year smoking history. He has never used smokeless tobacco. He reports that he drinks alcohol. He reports that he does not use illicit drugs.  Family History:  The patient's family history includes Appendicitis in his brother; Cancer in his brother; Cirrhosis in his brother; Dementia in his sister; Diabetes in his son; Heart disease in his mother, sister, sister, and sister; Heart disease (age of onset: 68) in his father; Hyperlipidemia in an other family member; Hypertension in his brother, father, sister, and another family member; Kidney disease in his brother; Myasthenia gravis in his brother; Tremor in his sister.    ROS:  Please see the history of present illness. Patient denies fever, chills, headache, sweats, rash, change in vision, change in hearing, chest pain, cough, nausea or vomiting, urinary symptoms. All other systems are reviewed and are negative.        PHYSICAL EXAM: VS:  BP 130/80 mmHg  Pulse 62  Ht 6' (1.829 m)  Wt 209 lb (94.802 kg)  BMI 28.34 kg/m2 , Patient is here with his family member. He is oriented to person time and place. Affect is normal. Head is atraumatic. Sclera and conjunctiva are normal. There is no jugulovenous distention. Lungs are clear. Respiratory effort is not labored. Cardiac exam reveals S1 and S2. Abdomen is soft. There is no peripheral edema. There are no musculoskeletal deformities. There are no skin rashes. Neurologic is grossly intact.  EKG:   EKG is not done today.   Recent Labs: 04/17/2013:  Magnesium 1.9 12/30/2013: BUN 10; Creatinine 1.1; Hemoglobin 16.3; Platelets 75.0*; Potassium 3.6; Sodium 136; TSH 2.17 01/09/2014: ALT 14    Lipid Panel    Component Value Date/Time   CHOL 189 12/30/2013 1213   TRIG 288.0* 12/30/2013 1213   HDL 42.30 12/30/2013 1213   CHOLHDL 4 12/30/2013 1213   VLDL 57.6* 12/30/2013 1213   LDLCALC 74 03/11/2013 1245   LDLDIRECT 81.6 12/30/2013 1213      Wt Readings from Last 3 Encounters:  03/10/14 209 lb (94.802 kg)  01/09/14 211 lb 6.4 oz (95.89 kg)  12/30/13 212 lb 6.4 oz (96.344 kg)      Current medicines are reviewed. He understands his medications. There are no significant changes.  He'll see him back for cardiology follow-up in 3 months.     ASSESSMENT AND PLAN:

## 2014-03-12 DIAGNOSIS — L570 Actinic keratosis: Secondary | ICD-10-CM | POA: Diagnosis not present

## 2014-03-12 DIAGNOSIS — D225 Melanocytic nevi of trunk: Secondary | ICD-10-CM | POA: Diagnosis not present

## 2014-03-12 DIAGNOSIS — Z85820 Personal history of malignant melanoma of skin: Secondary | ICD-10-CM | POA: Diagnosis not present

## 2014-03-12 DIAGNOSIS — Z08 Encounter for follow-up examination after completed treatment for malignant neoplasm: Secondary | ICD-10-CM | POA: Diagnosis not present

## 2014-03-12 DIAGNOSIS — L814 Other melanin hyperpigmentation: Secondary | ICD-10-CM | POA: Diagnosis not present

## 2014-03-12 LAB — MDC_IDC_ENUM_SESS_TYPE_REMOTE
Brady Statistic RA Percent Paced: 83 %
Brady Statistic RV Percent Paced: 98 %
HighPow Impedance: 61 Ohm
Implantable Pulse Generator Serial Number: 490477
Lead Channel Impedance Value: 459 Ohm
Lead Channel Impedance Value: 468 Ohm
Lead Channel Impedance Value: 591 Ohm
Lead Channel Pacing Threshold Amplitude: 1 V
Lead Channel Pacing Threshold Pulse Width: 0.4 ms
Lead Channel Sensing Intrinsic Amplitude: 10.8 mV
Lead Channel Sensing Intrinsic Amplitude: 3.5 mV
Lead Channel Setting Pacing Amplitude: 2 V
Lead Channel Setting Pacing Pulse Width: 0.4 ms
Lead Channel Setting Sensing Sensitivity: 0.6 mV
Lead Channel Setting Sensing Sensitivity: 1 mV
MDC IDC MSMT BATTERY REMAINING LONGEVITY: 78 mo
MDC IDC MSMT BATTERY REMAINING PERCENTAGE: 100 %
MDC IDC MSMT LEADCHNL LV PACING THRESHOLD AMPLITUDE: 0.6 V
MDC IDC MSMT LEADCHNL LV PACING THRESHOLD PULSEWIDTH: 0.8 ms
MDC IDC MSMT LEADCHNL RA PACING THRESHOLD AMPLITUDE: 1.1 V
MDC IDC MSMT LEADCHNL RV PACING THRESHOLD PULSEWIDTH: 0.4 ms
MDC IDC SESS DTM: 20160208064100
MDC IDC SET LEADCHNL LV PACING AMPLITUDE: 2 V
MDC IDC SET LEADCHNL LV PACING PULSEWIDTH: 0.8 ms
MDC IDC SET LEADCHNL RV PACING AMPLITUDE: 2.4 V
Zone Setting Detection Interval: 286 ms
Zone Setting Detection Interval: 333 ms

## 2014-03-24 ENCOUNTER — Encounter: Payer: Self-pay | Admitting: *Deleted

## 2014-03-31 ENCOUNTER — Encounter: Payer: Self-pay | Admitting: Cardiology

## 2014-04-01 ENCOUNTER — Ambulatory Visit (INDEPENDENT_AMBULATORY_CARE_PROVIDER_SITE_OTHER): Payer: Medicare Other | Admitting: *Deleted

## 2014-04-01 DIAGNOSIS — K550 Acute vascular disorders of intestine: Secondary | ICD-10-CM

## 2014-04-01 DIAGNOSIS — K55069 Acute infarction of intestine, part and extent unspecified: Secondary | ICD-10-CM

## 2014-04-01 LAB — POCT INR: INR: 4.6

## 2014-04-04 ENCOUNTER — Encounter: Payer: Self-pay | Admitting: Internal Medicine

## 2014-04-14 ENCOUNTER — Ambulatory Visit (INDEPENDENT_AMBULATORY_CARE_PROVIDER_SITE_OTHER): Payer: Medicare Other | Admitting: Pharmacist

## 2014-04-14 ENCOUNTER — Other Ambulatory Visit (INDEPENDENT_AMBULATORY_CARE_PROVIDER_SITE_OTHER): Payer: Medicare Other

## 2014-04-14 ENCOUNTER — Encounter: Payer: Self-pay | Admitting: Internal Medicine

## 2014-04-14 ENCOUNTER — Ambulatory Visit (INDEPENDENT_AMBULATORY_CARE_PROVIDER_SITE_OTHER): Payer: Medicare Other | Admitting: Internal Medicine

## 2014-04-14 VITALS — BP 142/70 | HR 66 | Ht 72.0 in | Wt 207.2 lb

## 2014-04-14 DIAGNOSIS — R600 Localized edema: Secondary | ICD-10-CM

## 2014-04-14 DIAGNOSIS — K550 Acute vascular disorders of intestine: Secondary | ICD-10-CM | POA: Diagnosis not present

## 2014-04-14 DIAGNOSIS — I255 Ischemic cardiomyopathy: Secondary | ICD-10-CM | POA: Diagnosis not present

## 2014-04-14 DIAGNOSIS — K7460 Unspecified cirrhosis of liver: Secondary | ICD-10-CM

## 2014-04-14 DIAGNOSIS — R195 Other fecal abnormalities: Secondary | ICD-10-CM | POA: Diagnosis not present

## 2014-04-14 DIAGNOSIS — K55069 Acute infarction of intestine, part and extent unspecified: Secondary | ICD-10-CM

## 2014-04-14 DIAGNOSIS — G4733 Obstructive sleep apnea (adult) (pediatric): Secondary | ICD-10-CM

## 2014-04-14 DIAGNOSIS — Z8719 Personal history of other diseases of the digestive system: Secondary | ICD-10-CM | POA: Diagnosis not present

## 2014-04-14 DIAGNOSIS — K7581 Nonalcoholic steatohepatitis (NASH): Secondary | ICD-10-CM

## 2014-04-14 HISTORY — DX: Unspecified cirrhosis of liver: K74.60

## 2014-04-14 LAB — CBC WITH DIFFERENTIAL/PLATELET
Basophils Absolute: 0 10*3/uL (ref 0.0–0.1)
Basophils Relative: 0.4 % (ref 0.0–3.0)
Eosinophils Absolute: 0.1 10*3/uL (ref 0.0–0.7)
Eosinophils Relative: 1.8 % (ref 0.0–5.0)
HEMATOCRIT: 46.9 % (ref 39.0–52.0)
Hemoglobin: 16.2 g/dL (ref 13.0–17.0)
LYMPHS ABS: 1.2 10*3/uL (ref 0.7–4.0)
Lymphocytes Relative: 29.8 % (ref 12.0–46.0)
MCHC: 34.5 g/dL (ref 30.0–36.0)
MCV: 87.8 fl (ref 78.0–100.0)
MONOS PCT: 18 % — AB (ref 3.0–12.0)
Monocytes Absolute: 0.8 10*3/uL (ref 0.1–1.0)
NEUTROS ABS: 2.1 10*3/uL (ref 1.4–7.7)
Neutrophils Relative %: 50 % (ref 43.0–77.0)
PLATELETS: 77 10*3/uL — AB (ref 150.0–400.0)
RBC: 5.34 Mil/uL (ref 4.22–5.81)
RDW: 14.4 % (ref 11.5–15.5)
WBC: 4.2 10*3/uL (ref 4.0–10.5)

## 2014-04-14 LAB — COMPREHENSIVE METABOLIC PANEL
ALT: 21 U/L (ref 0–53)
AST: 32 U/L (ref 0–37)
Albumin: 4.1 g/dL (ref 3.5–5.2)
Alkaline Phosphatase: 103 U/L (ref 39–117)
BILIRUBIN TOTAL: 3.5 mg/dL — AB (ref 0.2–1.2)
BUN: 9 mg/dL (ref 6–23)
CO2: 33 mEq/L — ABNORMAL HIGH (ref 19–32)
Calcium: 9.3 mg/dL (ref 8.4–10.5)
Chloride: 103 mEq/L (ref 96–112)
Creatinine, Ser: 0.99 mg/dL (ref 0.40–1.50)
GFR: 77.13 mL/min (ref >60.00)
Glucose, Bld: 76 mg/dL (ref 70–99)
Potassium: 3.9 mEq/L (ref 3.5–5.1)
Sodium: 140 mEq/L (ref 135–145)
TOTAL PROTEIN: 6.6 g/dL (ref 6.0–8.3)

## 2014-04-14 LAB — PROTIME-INR
INR: 2.5 ratio — ABNORMAL HIGH (ref 0.8–1.0)
PROTHROMBIN TIME: 27.3 s — AB (ref 9.6–13.1)

## 2014-04-14 LAB — POCT INR: INR: 2.5

## 2014-04-14 NOTE — Progress Notes (Signed)
Subjective:    Patient ID: [name], male    DOB: March 20, 1931, 81 y.o.   MRN: [id number]  HPI [name] is an 81 yo male with PMH of Crohn's enteritis status post resection with possible right colon involvement though very mild seen at colonoscopy March 2015, not biopsied due to recent bleeding, new diagnosis of probable [name] cirrhosis, ischemic cardiomyopathy on warfarin, history of iron deficiency anemia who is seen for follow-up. He is here today with his wife. I saw him in December 2015, and at that time an ultrasound report raised the question of a nodular liver. Further evaluation was done including a Fibrosure which showed F4 or cirrhosis. He returns for follow-up  Overall his been feeling well though he did have a viral gastroenteritis approximate 2 weeks ago. This consisted of nausea, vomiting and diarrhea. His wife also had the illness. Took him several days to recover but he is feeling better now. Bowel movements have been mostly normal occurring in the morning with formed brown stool. He denies rectal bleeding or melena. Occasionally he will have several other little more loose stools later in the morning. This occurs 3-4 days a week. Appetite fluctuates and he seems to have lost the taste for his food. By our scales weight is down 3 pounds. He used to drink beer because it helped improve his appetite but he has avoided this since my office advised him not to drink alcohol. He's had some lower extremity swelling but is taking Lasix 80 mg daily. He doesn't feel increasing abdominal girth. No jaundice. No itching.  Of note his brother had alcoholic cirrhosis though he never drank alcohol heavily or consistently.    Review of Systems As per history of present illness, otherwise negative   Current Medications, Allergies, Past Medical History, Past Surgical History, Family History and Social History were reviewed in [employer] record.     Objective:   Physical Exam BP 142/70 mmHg  Pulse 66  Ht 6' (1.829 m)  Wt 207 lb 3.2 oz (93.985 kg)  BMI 28.10 kg/m2 Constitutional: Well-developed and well-nourished. No distress. HEENT: Normocephalic and atraumatic. Oropharynx is clear and moist. No oropharyngeal exudate. Conjunctivae are normal.  No scleral icterus. Neck: Neck supple. Trachea midline. Cardiovascular: Normal rate, regular rhythm and intact distal pulses. No M/R/G Pulmonary/chest: Effort normal and breath sounds normal. No wheezing, rales or rhonchi. Abdominal: Soft, nontender, nondistended. Bowel sounds active throughout. Large prior abdominal scars Extremities: no clubbing, cyanosis, with 1+ LE edema to mid-shin today Lymphadenopathy: No cervical adenopathy noted. Neurological: Alert and oriented to person place and time. No asterixis Skin: Skin is warm and dry. No rashes noted. Psychiatric: Normal mood and affect. Behavior is normal.  CMP     Component Value Date/Time   NA 136 12/30/2013 1213   K 3.6 12/30/2013 1213   CL 102 12/30/2013 1213   CO2 25 12/30/2013 1213   GLUCOSE 110* 12/30/2013 1213   BUN 10 12/30/2013 1213   CREATININE 1.1 12/30/2013 1213   CREATININE 1.09 06/10/2013 1228   CALCIUM 9.1 12/30/2013 1213   PROT 6.5 12/30/2013 1213   ALBUMIN 4.1 12/30/2013 1213   ALBUMIN 4.1 12/30/2013 1213   AST 32 12/30/2013 1213   ALT 14 01/09/2014 1138   ALT 21 12/30/2013 1213   ALKPHOS 85 12/30/2013 1213   BILITOT 2.8* 12/30/2013 1213   GFRNONAA 78* 04/22/2013 0643   GFRAA >90 04/22/2013 0643    Lab Results  Component Value Date  INR 4.6 04/01/2014   INR 2.9 02/18/2014   INR 2.3 01/07/2014   PROTIME 17.1 07/01/2008       Assessment & Plan:  81 yo male with PMH of Crohn's enteritis status post resection with possible right colon involvement though very mild seen at colonoscopy March 2015, not biopsied due to recent bleeding, new diagnosis of probable [name] cirrhosis, ischemic cardiomyopathy on warfarin, history  of iron deficiency anemia who is seen for follow-up.  1. Cirrhosis, likely NASH -- we had a discussion today regarding cirrhosis and at this point his disease seems well compensated. Repeat liver labs today to include CBC, CMP and INR. INR will be difficult to follow as it pertains to his cirrhosis given he is on warfarin. INR is elevated today at 4.6 which is above the therapeutic window for him and I will have him contact his at the coagulation clinic. --[place] screening -- we discussed the elevated risk of [place] in patients with cirrhosis. Ultrasound every 6 months recommended. Ultrasound in December 2015 without focal liver lesion. Repeat June 2016 --Esophageal varices -- no history of esophageal varices, last endoscopy 04/18/2013. Repeat would be due March 2018 unless decompensation occurs first --Autoimmune markers negative --Low sodium diet --He does have some mild lower extremity edema and is already on Lasix 80 mg daily. May benefit from the addition of spironolactone, but will monitor for now. I asked that he notify me should the swelling increase or he notices any increasing abdominal girth. He voices understanding --Vaccines up-to-date, annual flu vaccine recommended --Avoid alcohol  2. Crohn's disease -- asymptomatic. He did have mild erythema and edema in the ascending colon at colonoscopy in March 2015. Given overall lack of active disease symptoms I'm hesitant to start any immunosuppressive regimen at this time. He agrees with this plan.   3. Intermittent loose stools -- cannot exclude this is a Crohn's related symptoms but overall stools are mostly formed. Add Benefiber 1-2 tablespoons daily to try to avoid loose stools that sometimes follow his normal stools. There is no bleeding or melena now.  4. OSA -- history of sleep apnea. Previous did not tolerate the full facemask for his CPAP. Some of his lower extremity edema could be related to sleep apnea. He will contact Dr. Lenna Gilford to see if  possible he could be fitted for a nasal mask only which may help with compliance.  Followup with me in June 2016 after repeat liver US

## 2014-04-14 NOTE — Patient Instructions (Signed)
Please purchase the following medications over the counter and take as directed: Benefiber- Take 1-2 tablespoons daily to bulk stools  Please continue all medications as prescribed.  Please avoid beer.  Follow a low sodium diet (see below).  You will be due for a liver ultrasound in June for screening. We will contact you with an appointment time and date as well as prep instructions when it gets closer to that time.  After your liver ultrasound in June, we would like to see you back in the office.  Your physician has requested that you go to the basement for the following lab work before leaving today: CBC, CMP, INR  Low-Sodium Eating Plan Sodium raises blood pressure and causes water to be held in the body. Getting less sodium from food will help lower your blood pressure, reduce any swelling, and protect your heart, liver, and kidneys. We get sodium by adding salt (sodium chloride) to food. Most of our sodium comes from canned, boxed, and frozen foods. Restaurant foods, fast foods, and pizza are also very high in sodium. Even if you take medicine to lower your blood pressure or to reduce fluid in your body, getting less sodium from your food is important. WHAT IS MY PLAN? Most people should limit their sodium intake to 2,300 mg a day. Your health care provider recommends that you limit your sodium intake to __________ a day.  WHAT DO I NEED TO KNOW ABOUT THIS EATING PLAN? For the low-sodium eating plan, you will follow these general guidelines:  Choose foods with a % Daily Value for sodium of less than 5% (as listed on the food label).   Use salt-free seasonings or herbs instead of table salt or sea salt.   Check with your health care provider or pharmacist before using salt substitutes.   Eat fresh foods.  Eat more vegetables and fruits.  Limit canned vegetables. If you do use them, rinse them well to decrease the sodium.   Limit cheese to 1 oz (28 g) per day.   Eat  lower-sodium products, often labeled as "lower sodium" or "no salt added."  Avoid foods that contain monosodium glutamate (MSG). MSG is sometimes added to [place] food and some canned foods.  Check food labels (Nutrition Facts labels) on foods to learn how much sodium is in one serving.  Eat more home-cooked food and less restaurant, buffet, and fast food.  When eating at a restaurant, ask that your food be prepared with less salt or none, if possible.  HOW DO I READ FOOD LABELS FOR SODIUM INFORMATION? The Nutrition Facts label lists the amount of sodium in one serving of the food. If you eat more than one serving, you must multiply the listed amount of sodium by the number of servings. Food labels may also identify foods as:  Sodium free--Less than 5 mg in a serving.  Very low sodium--35 mg or less in a serving.  Low sodium--140 mg or less in a serving.  Light in sodium--50% less sodium in a serving. For example, if a food that usually has 300 mg of sodium is changed to become light in sodium, it will have 150 mg of sodium.  Reduced sodium--25% less sodium in a serving. For example, if a food that usually has 400 mg of sodium is changed to reduced sodium, it will have 300 mg of sodium. WHAT FOODS CAN I EAT? Grains Low-sodium cereals, including oats, puffed wheat and rice, and shredded wheat cereals. Low-sodium crackers. Unsalted rice  and pasta. Lower-sodium bread.  Vegetables Frozen or fresh vegetables. Low-sodium or reduced-sodium canned vegetables. Low-sodium or reduced-sodium tomato sauce and paste. Low-sodium or reduced-sodium tomato and vegetable juices.  Fruits Fresh, frozen, and canned fruit. Fruit juice.  Meat and Other Protein Products Low-sodium canned tuna and salmon. Fresh or frozen meat, poultry, seafood, and fish. Lamb. Unsalted nuts. Dried beans, peas, and lentils without added salt. Unsalted canned beans. Homemade soups without salt. Eggs.  Dairy Milk. Soy  milk. Ricotta cheese. Low-sodium or reduced-sodium cheeses. Yogurt.  Condiments Fresh and dried herbs and spices. Salt-free seasonings. Onion and garlic powders. Low-sodium varieties of mustard and ketchup. Lemon juice.  Fats and Oils Reduced-sodium salad dressings. Unsalted butter.  Other Unsalted popcorn and pretzels.  The items listed above may not be a complete list of recommended foods or beverages. Contact your dietitian for more options. WHAT FOODS ARE NOT RECOMMENDED? Grains Instant hot cereals. Bread stuffing, pancake, and biscuit mixes. Croutons. Seasoned rice or pasta mixes. Noodle soup cups. Boxed or frozen macaroni and cheese. Self-rising flour. Regular salted crackers. Vegetables Regular canned vegetables. Regular canned tomato sauce and paste. Regular tomato and vegetable juices. Frozen vegetables in sauces. Salted french fries. Olives. Angie Fava. Relishes. Sauerkraut. Salsa. Meat and Other Protein Products Salted, canned, smoked, spiced, or pickled meats, seafood, or fish. Bacon, ham, sausage, hot dogs, corned beef, chipped beef, and packaged luncheon meats. Salt pork. Jerky. Pickled herring. Anchovies, regular canned tuna, and sardines. Salted nuts. Dairy Processed cheese and cheese spreads. Cheese curds. Blue cheese and cottage cheese. Buttermilk.  Condiments Onion and garlic salt, seasoned salt, table salt, and sea salt. Canned and packaged gravies. Worcestershire sauce. Tartar sauce. Barbecue sauce. Teriyaki sauce. Soy sauce, including reduced sodium. Steak sauce. Fish sauce. Oyster sauce. Cocktail sauce. Horseradish. Regular ketchup and mustard. Meat flavorings and tenderizers. Bouillon cubes. Hot sauce. Tabasco sauce. Marinades. Taco seasonings. Relishes. Fats and Oils Regular salad dressings. Salted butter. Margarine. Ghee. Bacon fat.  Other Potato and tortilla chips. Corn chips and puffs. Salted popcorn and pretzels. Canned or dried soups. Pizza. Frozen  entrees and pot pies.  The items listed above may not be a complete list of foods and beverages to avoid. Contact your dietitian for more information. Document Released: 07/09/2001 Document Revised: 01/22/2013 Document Reviewed: 11/21/2012 [facility] Patient Information 2015 [place], [place]. This information is not intended to replace advice given to you by your health care provider. Make sure you discuss any questions you have with your health care provider.

## 2014-05-03 ENCOUNTER — Other Ambulatory Visit: Payer: Self-pay | Admitting: Cardiology

## 2014-05-05 ENCOUNTER — Ambulatory Visit (INDEPENDENT_AMBULATORY_CARE_PROVIDER_SITE_OTHER): Payer: Medicare Other

## 2014-05-05 DIAGNOSIS — K55069 Acute infarction of intestine, part and extent unspecified: Secondary | ICD-10-CM

## 2014-05-05 DIAGNOSIS — K550 Acute vascular disorders of intestine: Secondary | ICD-10-CM | POA: Diagnosis not present

## 2014-05-05 LAB — POCT INR: INR: 2.6

## 2014-05-06 ENCOUNTER — Encounter: Payer: Self-pay | Admitting: Family Medicine

## 2014-05-06 ENCOUNTER — Ambulatory Visit (INDEPENDENT_AMBULATORY_CARE_PROVIDER_SITE_OTHER): Payer: Medicare Other | Admitting: Family Medicine

## 2014-05-06 VITALS — BP 120/74 | HR 68 | Temp 97.6°F | Resp 18 | Ht 72.0 in | Wt 205.0 lb

## 2014-05-06 DIAGNOSIS — K219 Gastro-esophageal reflux disease without esophagitis: Secondary | ICD-10-CM

## 2014-05-06 DIAGNOSIS — I10 Essential (primary) hypertension: Secondary | ICD-10-CM | POA: Diagnosis not present

## 2014-05-06 DIAGNOSIS — I255 Ischemic cardiomyopathy: Secondary | ICD-10-CM

## 2014-05-06 DIAGNOSIS — D696 Thrombocytopenia, unspecified: Secondary | ICD-10-CM

## 2014-05-06 DIAGNOSIS — D62 Acute posthemorrhagic anemia: Secondary | ICD-10-CM

## 2014-05-06 DIAGNOSIS — E785 Hyperlipidemia, unspecified: Secondary | ICD-10-CM

## 2014-05-06 NOTE — Patient Instructions (Signed)

## 2014-05-11 ENCOUNTER — Encounter: Payer: Self-pay | Admitting: Family Medicine

## 2014-05-11 NOTE — Assessment & Plan Note (Signed)
Tolerating statin, encouraged heart healthy diet, avoid trans fats, minimize simple carbs and saturated fats. Increase exercise as tolerated 

## 2014-05-11 NOTE — Assessment & Plan Note (Signed)
Stable, mild, asymptomatic

## 2014-05-11 NOTE — Assessment & Plan Note (Signed)
Avoid offending foods, start probiotics. Do not eat large meals in late evening and consider raising head of bed.  

## 2014-05-11 NOTE — Assessment & Plan Note (Signed)
Well controlled, no changes to meds. Encouraged heart healthy diet such as the DASH diet and exercise as tolerated.  °

## 2014-05-11 NOTE — Progress Notes (Signed)
[name]  [id number] 1931/06/21 05/11/2014      Progress Note-Follow Up  Subjective  Chief Complaint  Chief Complaint  Patient presents with  . Follow-up    4 mo    HPI  Patient is a 81 y.o. male in today for routine medical care. Patient is in today for routine care. No recent illness or acute concerns. Does note fatigue. But this is stable. Denies CP/palp/SOB/HA/congestion/fevers/GI or GU c/o. Taking meds as prescribed   Past Medical History  Diagnosis Date  . Dizziness     Positional dizziness  . Hypertension   . Ischemic cardiomyopathy     CABG 1994 CAD catherization 1996.[name] occluded vein graft to the diagnol, other grafts patent/  catherization 2006, no PCI/ nuclear.[name] october 2011.[name] extensive scar anteroseptal and apical.. no ischemia    . CHF (congestive heart failure)     chronic systolic  . Mitral regurgitation     mild.[name] echo november 2011  EF 35%...echo.. november 2009/ ef 35%.[name] echo november 2011  . Sinus bradycardia   . Chronotropic incompetence   . Biventricular implantable cardiac defibrillator in situ     GDT CONTAK H170-MADIT-CRT-EXPLANTED 2011 implanted defibrillator- [employer] cognis, model n119-2001 Dr. Caryl Comes (11/28/2009)  . LBBB (left bundle branch block)   . Hyperlipemia   . GERD (gastroesophageal reflux disease)   . Colonic polyp 2010    pathology not clear.   . Crohn's disease   . Acute vascular insufficiency of intestine     Mesenteric embolus...surgery 2003  . Ventral hernia   . Hypertrophy of prostate with urinary obstruction and other lower urinary tract symptoms (LUTS)   . Degenerative joint disease   . Polymyalgia rheumatica   . Tremor   . Anxiety   . Shingles   . Vitamin B12 deficiency   . CAD (coronary artery disease)     CABG 1994  /   catheterization 1996, occluded vein graft to the diagonal, other grafts patent  /   catheterization 2006, no PCI  /  nuclear, October, 2011, extensive scar anteroseptal and apical, no ischemia  .  Hx of CABG     1994  . Ejection fraction     EF 35%, echo, November, 2011  //   Is 35-40%, echo,  September 23, 2011  . Warfarin anticoagulation     Coumadin therapy for mesenteric embolus 2003  . Carotid artery disease     Doppler, February,  2012, 0-39% bilateral, recommended followup 1 year  . Edema leg     Venous Dopplers 8/13: No DVT bilaterally  . Breast tenderness     Spironolactone was stopped and eplerinone started. Patient feels better  . Thrombocytopenia 2008  . Edema 09/02/2013  . Acute bronchitis 09/02/2013  . Iron deficiency anemia   . Thrush   . Colon polyp     Past Surgical History  Procedure Laterality Date  . Coronary artery bypass graft  1994  . Laparoscopic cholecystectomy  2002  . Elap w/ superior mesenteric artery embolectomy  1/03    by Dr. Jennette Banker  . Repair of incarcerated ventral hernia and lysis of adhesions  11/03    by Dr. Betsy Pries  . Implantation of biventric cardioverter-defibrillatorr      by Dr. Lovena Le in 8/06 [employer] [place] 2011  . Implantation of guidant cognis device      model [id number]  . Left inguinal hernia repair with mesh  6/08    by Dr. Betsy Pries  . Prostate surgery  08/2012    by urology in [place]  . Esophagogastroduodenoscopy N/A 04/18/2013    Procedure: ESOPHAGOGASTRODUODENOSCOPY (EGD);  Surgeon: Jerene Bears, MD;  Location: [facility] ENDOSCOPY;  Service: Endoscopy;  Laterality: N/A;  . Colonoscopy N/A 04/19/2013    Procedure: COLONOSCOPY;  Surgeon: Jerene Bears, MD;  Location: [facility] ENDOSCOPY;  Service: Endoscopy;  Laterality: N/A;    Family History  Problem Relation Age of Onset  . Heart disease Mother   . Hypertension    . Hyperlipidemia    . Tremor Sister   . Heart disease Sister     heart failure and disease  . Heart disease Father 30    Deceased   . Hypertension Father   . Appendicitis Brother     ruptured  . Diabetes Son   . Heart disease Sister   . Hypertension Sister   . Dementia Sister   . Heart disease Sister      pedal edema  . Hypertension Brother   . Kidney disease Brother   . Myasthenia gravis Brother   . Cirrhosis Brother   . Cancer Brother     leukemia    History   Social History  . Marital Status: Married    Spouse Name: Kennyth Lose  . Number of Children: 3  . 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 Smoking status: Former Smoker -- 1.00 packs/day for 20 years    Types: Cigarettes    Quit date: 01/31/1973  . Smokeless tobacco: Never Used  . Alcohol Use: 0.0 oz/week    0 Standard drinks or equivalent per week     Comment: occasional  . Drug Use: No  . Sexual Activity: No     Comment: lives with wife, no dietary restrictions   Other Topics Concern  . Not on file   Social History Narrative   Lives in [place], [place] with wife.     Current Outpatient Prescriptions on File Prior to Visit  Medication Sig Dispense Refill  . carvedilol (COREG) 6.25 MG tablet Take 1 tablet by mouth  twice a day with meals 180 tablet 3  . cholecalciferol (VITAMIN D) 1000 UNITS tablet Take 1,000 Units by mouth daily.     . Cyanocobalamin (B-12) 1000 MCG CAPS Take 1,000 mcg by mouth daily.     . furosemide (LASIX) 40 MG tablet Take 1 tablet (40 mg total) by mouth 2 (two) times daily. 180 tablet 1  . KLOR-CON M20 20 MEQ tablet TAKE 1 TABLET BY MOUTH DAILY. 30 tablet 1  . lisinopril (PRINIVIL,ZESTRIL) 10 MG tablet Take 1 tablet (10 mg total) by mouth daily. 90 tablet 3  . pantoprazole (PROTONIX) 40 MG tablet TAKE 1 TABLET EVERY DAY AT 12 NOON 90 tablet 1  . simvastatin (ZOCOR) 20 MG tablet Take 1 tablet by mouth at  bedtime 90 tablet 1  . warfarin (COUMADIN) 5 MG tablet Take as directed by  anticoagulation clinic 150 tablet 1   No current facility-administered medications on file prior to visit.    No Known Allergies  Review of Systems  Review of Systems  Constitutional: Positive for malaise/fatigue. Negative  for fever.  HENT: Negative for congestion.   Eyes: Negative for discharge.  Respiratory: Negative for shortness of breath.   Cardiovascular: Negative for chest pain, palpitations and leg swelling.  Gastrointestinal: Negative for nausea, abdominal pain and diarrhea.  Genitourinary:  Negative for dysuria.  Musculoskeletal: Negative for falls.  Skin: Negative for rash.  Neurological: Negative for loss of consciousness and headaches.  Endo/Heme/Allergies: Negative for polydipsia.  Psychiatric/Behavioral: Negative for depression and suicidal ideas. The patient is not nervous/anxious and does not have insomnia.     Objective  BP 120/74 mmHg  Pulse 68  Temp(Src) 97.6 F (36.4 C) (Oral)  Resp 18  Ht 6' (1.829 m)  Wt 205 lb (92.987 kg)  BMI 27.80 kg/m2  SpO2 98%  Physical Exam  Physical Exam  Constitutional: He is oriented to person, place, and time and well-developed, well-nourished, and in no distress. No distress.  HENT:  Head: Normocephalic and atraumatic.  Eyes: Conjunctivae are normal.  Neck: Neck supple. No thyromegaly present.  Cardiovascular: Normal rate, regular rhythm and normal heart sounds.   Pulmonary/Chest: Effort normal and breath sounds normal. No respiratory distress.  Abdominal: He exhibits no distension and no mass. There is no tenderness.  Musculoskeletal: He exhibits no edema.  Neurological: He is alert and oriented to person, place, and time.  Skin: Skin is warm.  Psychiatric: Memory, affect and judgment normal.    Lab Results  Component Value Date   TSH 2.17 12/30/2013   Lab Results  Component Value Date   WBC 4.2 04/14/2014   HGB 16.2 04/14/2014   HCT 46.9 04/14/2014   MCV 87.8 04/14/2014   PLT 77.0* 04/14/2014   Lab Results  Component Value Date   CREATININE 0.99 04/14/2014   BUN 9 04/14/2014   NA 140 04/14/2014   K 3.9 04/14/2014   CL 103 04/14/2014   CO2 33* 04/14/2014   Lab Results  Component Value Date   ALT 21 04/14/2014   AST 32  04/14/2014   ALKPHOS 103 04/14/2014   BILITOT 3.5* 04/14/2014   Lab Results  Component Value Date   CHOL 189 12/30/2013   Lab Results  Component Value Date   HDL 42.30 12/30/2013   Lab Results  Component Value Date   LDLCALC 74 03/11/2013   Lab Results  Component Value Date   TRIG 288.0* 12/30/2013   Lab Results  Component Value Date   CHOLHDL 4 12/30/2013     Assessment & Plan  Hypertension Well controlled, no changes to meds. Encouraged heart healthy diet such as the DASH diet and exercise as tolerated.    GERD Avoid offending foods, start probiotics. Do not eat large meals in late evening and consider raising head of bed.    Hyperlipemia Tolerating statin, encouraged heart healthy diet, avoid trans fats, minimize simple carbs and saturated fats. Increase exercise as tolerated   Thrombocytopenia Stable, mild, asymptomatic   Acute blood loss anemia Increase leafy greens, consider increased lean red meat and using cast iron cookware. Continue to monitor, report any concerns. resolved

## 2014-05-11 NOTE — Assessment & Plan Note (Signed)
Increase leafy greens, consider increased lean red meat and using cast iron cookware. Continue to monitor, report any concerns. resolved

## 2014-06-02 ENCOUNTER — Ambulatory Visit (INDEPENDENT_AMBULATORY_CARE_PROVIDER_SITE_OTHER): Payer: Medicare Other

## 2014-06-02 DIAGNOSIS — K550 Acute vascular disorders of intestine: Secondary | ICD-10-CM

## 2014-06-02 DIAGNOSIS — K55069 Acute infarction of intestine, part and extent unspecified: Secondary | ICD-10-CM

## 2014-06-02 LAB — POCT INR: INR: 3

## 2014-06-09 ENCOUNTER — Encounter: Payer: Self-pay | Admitting: Internal Medicine

## 2014-06-09 ENCOUNTER — Ambulatory Visit (INDEPENDENT_AMBULATORY_CARE_PROVIDER_SITE_OTHER): Payer: Medicare Other | Admitting: *Deleted

## 2014-06-09 DIAGNOSIS — I255 Ischemic cardiomyopathy: Secondary | ICD-10-CM

## 2014-06-09 NOTE — Progress Notes (Signed)
Remote ICD transmission.   

## 2014-06-11 ENCOUNTER — Encounter: Payer: Self-pay | Admitting: Cardiology

## 2014-06-11 ENCOUNTER — Ambulatory Visit (INDEPENDENT_AMBULATORY_CARE_PROVIDER_SITE_OTHER): Payer: Medicare Other | Admitting: Cardiology

## 2014-06-11 VITALS — BP 116/64 | HR 62 | Ht 72.0 in | Wt 203.0 lb

## 2014-06-11 DIAGNOSIS — I25810 Atherosclerosis of coronary artery bypass graft(s) without angina pectoris: Secondary | ICD-10-CM | POA: Diagnosis not present

## 2014-06-11 DIAGNOSIS — I255 Ischemic cardiomyopathy: Secondary | ICD-10-CM

## 2014-06-11 LAB — CUP PACEART REMOTE DEVICE CHECK
Battery Remaining Longevity: 72 mo
Battery Remaining Percentage: 100 %
Date Time Interrogation Session: 20160509054200
HighPow Impedance: 61 Ohm
Lead Channel Impedance Value: 445 Ohm
Lead Channel Impedance Value: 470 Ohm
Lead Channel Impedance Value: 603 Ohm
Lead Channel Setting Pacing Amplitude: 2 V
Lead Channel Setting Pacing Amplitude: 2.4 V
Lead Channel Setting Pacing Pulse Width: 0.4 ms
Lead Channel Setting Pacing Pulse Width: 0.8 ms
Lead Channel Setting Sensing Sensitivity: 1 mV
MDC IDC MSMT LEADCHNL LV PACING THRESHOLD AMPLITUDE: 0.6 V
MDC IDC MSMT LEADCHNL LV PACING THRESHOLD PULSEWIDTH: 0.8 ms
MDC IDC SET LEADCHNL LV PACING AMPLITUDE: 2 V
MDC IDC SET LEADCHNL RV SENSING SENSITIVITY: 0.6 mV
MDC IDC SET ZONE DETECTION INTERVAL: 286 ms
MDC IDC SET ZONE DETECTION INTERVAL: 333 ms
MDC IDC STAT BRADY RA PERCENT PACED: 84 %
MDC IDC STAT BRADY RV PERCENT PACED: 98 %
Pulse Gen Serial Number: 490477

## 2014-06-11 NOTE — Assessment & Plan Note (Signed)
Coronary disease is stable. No change in therapy.

## 2014-06-11 NOTE — Assessment & Plan Note (Signed)
At this point I feel we should not change his medicines any further.

## 2014-06-11 NOTE — Progress Notes (Signed)
Cardiology Office Note   Date:  06/11/2014   ID:  [name], DOB 18-Nov-1931, MRN [id number]  PCP:  [name] Homans, MD  Cardiologist:  Dola Argyle, MD   Chief Complaint  Patient presents with  . Appointment    Follow-up CHF      History of Present Illness: [name] is a 81 y.o. male who presents today to follow-up CHF. His appetite is not good with his multiple medical problems. His volume appears to be stable. I saw him last February, 2016. At that time I did give him some ampicillin for his sinusitis. This helped a great deal. He is not having any significant shortness of breath at rest. He does have some exertional shortness of breath.    Past Medical History  Diagnosis Date  . Dizziness     Positional dizziness  . Hypertension   . Ischemic cardiomyopathy     CABG 1994 CAD catherization 1996.[name] occluded vein graft to the diagnol, other grafts patent/  catherization 2006, no PCI/ nuclear.[name] october 2011.[name] extensive scar anteroseptal and apical.. no ischemia    . CHF (congestive heart failure)     chronic systolic  . Mitral regurgitation     mild.[name] echo november 2011  EF 35%...echo.. november 2009/ ef 35%.[name] echo november 2011  . Sinus bradycardia   . Chronotropic incompetence   . Biventricular implantable cardiac defibrillator in situ     GDT CONTAK H170-MADIT-CRT-EXPLANTED 2011 implanted defibrillator- [employer] cognis, model n119-2001 Dr. Caryl Comes (11/28/2009)  . LBBB (left bundle branch block)   . Hyperlipemia   . GERD (gastroesophageal reflux disease)   . Colonic polyp 2010    pathology not clear.   . Crohn's disease   . Acute vascular insufficiency of intestine     Mesenteric embolus...surgery 2003  . Ventral hernia   . Hypertrophy of prostate with urinary obstruction and other lower urinary tract symptoms (LUTS)   . Degenerative joint disease   . Polymyalgia rheumatica   . Tremor   . Anxiety   . Shingles   . Vitamin B12 deficiency   . CAD (coronary  artery disease)     CABG 1994  /   catheterization 1996, occluded vein graft to the diagonal, other grafts patent  /   catheterization 2006, no PCI  /  nuclear, October, 2011, extensive scar anteroseptal and apical, no ischemia  . Hx of CABG     1994  . Ejection fraction     EF 35%, echo, November, 2011  //   Is 35-40%, echo,  September 23, 2011  . Warfarin anticoagulation     Coumadin therapy for mesenteric embolus 2003  . Carotid artery disease     Doppler, February,  2012, 0-39% bilateral, recommended followup 1 year  . Edema leg     Venous Dopplers 8/13: No DVT bilaterally  . Breast tenderness     Spironolactone was stopped and eplerinone started. Patient feels better  . Thrombocytopenia 2008  . Edema 09/02/2013  . Acute bronchitis 09/02/2013  . Iron deficiency anemia   . Thrush   . Colon polyp     Past Surgical History  Procedure Laterality Date  . Coronary artery bypass graft  1994  . Laparoscopic cholecystectomy  2002  . Elap w/ superior mesenteric artery embolectomy  1/03    by Dr. Jennette Banker  . Repair of incarcerated ventral hernia and lysis of adhesions  11/03    by Dr. Betsy Pries  . Implantation of biventric cardioverter-defibrillatorr  by Dr. Lovena Le in 8/06 [employer] H170-explanted 2011  . Implantation of guidant cognis device      model [id number]  . Left inguinal hernia repair with mesh  6/08    by Dr. Betsy Pries  . Prostate surgery  08/2012    by urology in [place]  . Esophagogastroduodenoscopy N/A 04/18/2013    Procedure: ESOPHAGOGASTRODUODENOSCOPY (EGD);  Surgeon: Jerene Bears, MD;  Location: [facility] ENDOSCOPY;  Service: Endoscopy;  Laterality: N/A;  . Colonoscopy N/A 04/19/2013    Procedure: COLONOSCOPY;  Surgeon: Jerene Bears, MD;  Location: [facility] ENDOSCOPY;  Service: Endoscopy;  Laterality: N/A;    Patient Active Problem List   Diagnosis Date Noted  . Cirrhosis, nonalcoholic 41/93/7902  . Sinusitis 03/10/2014  . Allergic rhinitis 09/09/2013  . Acute bronchitis  09/02/2013  . Hypokalemia 06/18/2013  . Hypoalbuminemia 06/16/2013  . Left ankle pain 06/07/2013  . Acute blood loss anemia 04/17/2013  . Acute encephalopathy 04/17/2013  . GIB (gastrointestinal bleeding) 04/17/2013  . UGIB (upper gastrointestinal bleed) 04/16/2013  . Warfarin-induced coagulopathy 04/16/2013  . Thrombocytopenia 11/30/2011  . Ejection fraction   . Edema leg   . OSA (obstructive sleep apnea) 09/16/2011  . CAD (coronary artery disease)   . Hx of CABG   . Hypertension   . Chronic systolic heart failure   . Mitral regurgitation   . Chronotropic incompetence   . Hyperlipemia   . Crohn's disease   . Ventral hernia   . Tremor   . Warfarin anticoagulation   . Carotid artery disease   . 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 IMPLANTABLE DEFIBRILLATOR, GDT CONTAK H170-MADIT-CRT 12/09/2009  . HYPERTROPHY PROSTATE W/UR OBST & OTH LUTS 03/31/2009  . VITAMIN B12 DEFICIENCY 11/06/2008  . VITAMIN D DEFICIENCY 11/06/2008  . Cardiomyopathy, ischemic 09/15/2008  . LBBB 06/25/2008  . Fatigue 06/25/2008  . COLONIC POLYPS 03/23/2007  . POLYMYALGIA RHEUMATICA 03/23/2007  . Anxiety state 02/21/2007  . GERD 02/21/2007  . DEGENERATIVE JOINT DISEASE 02/21/2007      Current Outpatient Prescriptions  Medication Sig Dispense Refill  . carvedilol (COREG) 6.25 MG tablet Take 1 tablet by mouth  twice a day with meals 180 tablet 3  . cholecalciferol (VITAMIN D) 1000 UNITS tablet Take 1,000 Units by mouth daily.     . Cyanocobalamin (B-12) 1000 MCG CAPS Take 1,000 mcg by mouth daily.     . furosemide (LASIX) 40 MG tablet Take 1 tablet (40 mg total) by mouth 2 (two) times daily. 180 tablet 1  . KLOR-CON M20 20 MEQ tablet TAKE 1 TABLET BY MOUTH DAILY. 30 tablet 1  . lisinopril (PRINIVIL,ZESTRIL) 10 MG tablet Take 1 tablet (10 mg total) by mouth daily. 90 tablet 3  . pantoprazole (PROTONIX) 40 MG tablet TAKE 1 TABLET EVERY DAY AT 12 NOON 90 tablet 1  . simvastatin (ZOCOR) 20 MG tablet Take 1  tablet by mouth at  bedtime 90 tablet 1  . warfarin (COUMADIN) 5 MG tablet Take as directed by  anticoagulation clinic 150 tablet 1   No current facility-administered medications for this visit.    Allergies:   Review of patient's allergies indicates no known allergies.    Social History:  The patient  reports that he quit smoking about 41 years ago. His smoking use included Cigarettes. He has a 20 pack-year smoking history. He has never used smokeless tobacco. He reports that he drinks alcohol. He reports that he does not use illicit drugs.   Family History:  The patient's family history includes  Appendicitis in his brother; Cancer in his brother; Cirrhosis in his brother; Dementia in his sister; Diabetes in his son; Heart disease in his mother, sister, sister, and sister; Heart disease (age of onset: 92) in his father; Hyperlipidemia in an other family member; Hypertension in his brother, father, sister, and another family member; Kidney disease in his brother; Myasthenia gravis in his brother; Tremor in his sister.    ROS:  Please see the history of present illness.    Patient denies fever, chills, headache, sweats, rash, change in vision, change in hearing, chest pain, cough, nausea or vomiting, urinary symptoms. All other systems are reviewed and are negative.    PHYSICAL EXAM: VS:  BP 116/64 mmHg  Pulse 62  Ht 6' (1.829 m)  Wt 203 lb (92.08 kg)  BMI 27.53 kg/m2 , Patient is here with his wife. He is oriented to person time and place. Affect is normal. As always he's quiet with slow speech. Head is atraumatic. Sclera and conjunctiva are normal. There is no jugulovenous distention. Lungs are clear. Respiratory effort is nonlabored. Cardiac exam reveals S1 and S2. The abdomen is soft and there is no peripheral edema. There are no musculoskeletal deformities. There are no skin rashes. Neurologic is grossly intact.  EKG:   EKG is not done today.   Recent Labs: 12/30/2013: TSH  2.17 04/14/2014: ALT 21; BUN 9; Creatinine 0.99; Hemoglobin 16.2; Platelets 77.0*; Potassium 3.9; Sodium 140    Lipid Panel    Component Value Date/Time   CHOL 189 12/30/2013 1213   TRIG 288.0* 12/30/2013 1213   HDL 42.30 12/30/2013 1213   CHOLHDL 4 12/30/2013 1213   VLDL 57.6* 12/30/2013 1213   LDLCALC 74 03/11/2013 1245   LDLDIRECT 81.6 12/30/2013 1213      Wt Readings from Last 3 Encounters:  06/11/14 203 lb (92.08 kg)  05/06/14 205 lb (92.987 kg)  04/14/14 207 lb 3.2 oz (93.985 kg)      Current medicines are reviewed  The patient understands his medications.     ASSESSMENT AND PLAN:

## 2014-06-11 NOTE — Patient Instructions (Signed)
Medication Instructions:  Same  Labwork: None  Testing/Procedures: None  Follow-Up: Your physician wants you to follow-up in: September, 2016. You will receive a reminder letter in the mail two months in advance. If you don't receive a letter, please call our office to schedule the follow-up appointment.

## 2014-06-18 DIAGNOSIS — H26492 Other secondary cataract, left eye: Secondary | ICD-10-CM | POA: Diagnosis not present

## 2014-06-18 DIAGNOSIS — H02403 Unspecified ptosis of bilateral eyelids: Secondary | ICD-10-CM | POA: Diagnosis not present

## 2014-06-23 ENCOUNTER — Telehealth: Payer: Self-pay | Admitting: *Deleted

## 2014-06-23 ENCOUNTER — Encounter: Payer: Self-pay | Admitting: Cardiology

## 2014-06-23 DIAGNOSIS — K7460 Unspecified cirrhosis of liver: Secondary | ICD-10-CM

## 2014-06-23 NOTE — Telephone Encounter (Signed)
-----   Message from Larina Bras, [employer] sent at 04/14/2014 10:58 AM EDT ----- Needs liver ultrasound in June for [facility] screening (has cirrhosis); also needs office visit scheduled with pyrtle after ultrasound is completed... See 04/14/14 office note

## 2014-06-23 NOTE — Telephone Encounter (Signed)
Patient has been scheduled for abdominal ultrasound 07/03/14 @ 9:30 am [facility]. He has also been scheduled for office follow up with Dr Hilarie Fredrickson on 08/20/14 @ 2:00 pm (first available). I have spoken to patient's wife (DPR signed) to advise of this and she verbalizes understanding.

## 2014-07-03 ENCOUNTER — Ambulatory Visit (HOSPITAL_COMMUNITY)
Admission: RE | Admit: 2014-07-03 | Discharge: 2014-07-03 | Disposition: A | Discharge status: Home / Self Care | Payer: Medicare Other | Source: Ambulatory Visit | Attending: Internal Medicine | Admitting: Internal Medicine

## 2014-07-03 DIAGNOSIS — Z1289 Encounter for screening for malignant neoplasm of other sites: Secondary | ICD-10-CM | POA: Insufficient documentation

## 2014-07-03 DIAGNOSIS — K7460 Unspecified cirrhosis of liver: Secondary | ICD-10-CM | POA: Diagnosis not present

## 2014-07-03 DIAGNOSIS — R161 Splenomegaly, not elsewhere classified: Secondary | ICD-10-CM | POA: Diagnosis not present

## 2014-07-07 ENCOUNTER — Ambulatory Visit (INDEPENDENT_AMBULATORY_CARE_PROVIDER_SITE_OTHER): Payer: Medicare Other

## 2014-07-07 DIAGNOSIS — K55069 Acute infarction of intestine, part and extent unspecified: Secondary | ICD-10-CM

## 2014-07-07 DIAGNOSIS — K550 Acute vascular disorders of intestine: Secondary | ICD-10-CM

## 2014-07-07 LAB — POCT INR: INR: 2.5

## 2014-07-09 ENCOUNTER — Other Ambulatory Visit: Payer: Self-pay | Admitting: Cardiology

## 2014-07-15 DIAGNOSIS — Z961 Presence of intraocular lens: Secondary | ICD-10-CM | POA: Diagnosis not present

## 2014-07-15 DIAGNOSIS — H18413 Arcus senilis, bilateral: Secondary | ICD-10-CM | POA: Diagnosis not present

## 2014-07-15 DIAGNOSIS — H26492 Other secondary cataract, left eye: Secondary | ICD-10-CM | POA: Diagnosis not present

## 2014-07-18 ENCOUNTER — Other Ambulatory Visit: Payer: Self-pay | Admitting: Cardiology

## 2014-08-12 DIAGNOSIS — H26491 Other secondary cataract, right eye: Secondary | ICD-10-CM | POA: Diagnosis not present

## 2014-08-18 ENCOUNTER — Ambulatory Visit (INDEPENDENT_AMBULATORY_CARE_PROVIDER_SITE_OTHER): Payer: Medicare Other | Admitting: *Deleted

## 2014-08-18 DIAGNOSIS — K550 Acute vascular disorders of intestine: Secondary | ICD-10-CM | POA: Diagnosis not present

## 2014-08-18 DIAGNOSIS — K55069 Acute infarction of intestine, part and extent unspecified: Secondary | ICD-10-CM

## 2014-08-18 LAB — POCT INR: INR: 3.1

## 2014-08-20 ENCOUNTER — Encounter: Payer: Self-pay | Admitting: Internal Medicine

## 2014-08-20 ENCOUNTER — Ambulatory Visit (INDEPENDENT_AMBULATORY_CARE_PROVIDER_SITE_OTHER): Payer: Medicare Other | Admitting: Internal Medicine

## 2014-08-20 VITALS — BP 100/60 | HR 60 | Ht 72.0 in | Wt 199.0 lb

## 2014-08-20 DIAGNOSIS — R195 Other fecal abnormalities: Secondary | ICD-10-CM

## 2014-08-20 DIAGNOSIS — I255 Ischemic cardiomyopathy: Secondary | ICD-10-CM

## 2014-08-20 DIAGNOSIS — K7469 Other cirrhosis of liver: Secondary | ICD-10-CM | POA: Diagnosis not present

## 2014-08-20 DIAGNOSIS — K5090 Crohn's disease, unspecified, without complications: Secondary | ICD-10-CM

## 2014-08-20 DIAGNOSIS — K766 Portal hypertension: Secondary | ICD-10-CM | POA: Diagnosis not present

## 2014-08-20 NOTE — Patient Instructions (Signed)
Follow up in 6 months 

## 2014-08-20 NOTE — Progress Notes (Signed)
Subjective:    Patient ID: [name], male    DOB: 06/11/1931, 81 y.o.   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 He's here today with his wife. He is doing well. [place] from the beach for 1 [place] with her family. 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 He does drink plenty of fluids and try to stay hydrated. He had soreness in his right side last week but he thinks this is due to lifting 50 pound bag of fertilizer. The pain has resolved. He reports no issues with abdominal pain. He does intermittently have diarrhea but the Benefiber seems to have help. Diarrhea comes and goes every 3-4 months and last about a week. Reports no blood in his stool or melena. Appetite fluctuates. Down 7 pounds by our scales. Not drinking alcohol. No increasing lower extremity edema, abdominal distention or jaundice. No itching. No nosebleeding. Still on warfarin with recent INR around 3   Review of Systems As per HPI, otherwise negative  Current Medications, Allergies, Past Medical History, Past Surgical History, Family History and Social History were reviewed in [employer] record.     Objective:   Physical Exam BP 100/60 mmHg  Pulse 60  Ht 6' (1.829 m)  Wt 199 lb (90.266 kg)  BMI 26.98 kg/m2 Constitutional: Well-developed and well-nourished. No distress. HEENT: Normocephalic and atraumatic. Oropharynx is clear and moist. No oropharyngeal exudate. Conjunctivae are normal.  No scleral icterus. Neck: Neck supple. Trachea midline. Cardiovascular: Normal rate, regular rhythm and intact distal pulses. No M/R/G Pulmonary/chest: Effort normal and breath sounds normal. No wheezing,  rales or rhonchi. Abdominal: Soft, nontender, nondistended. Bowel sounds active throughout. Abdominal binder, large ventral hernia with well-healed abdominal scars  Extremities: no clubbing, cyanosis, or edema Neurological: Alert and oriented to person place and time. No asterixis Skin: Skin is warm and dry. No rashes noted. Psychiatric: Normal mood and affect. Behavior is normal.  CMP     Component Value Date/Time   NA 140 04/14/2014 1104   K 3.9 04/14/2014 1104   CL 103 04/14/2014 1104   CO2 33* 04/14/2014 1104   GLUCOSE 76 04/14/2014 1104   BUN 9 04/14/2014 1104   CREATININE 0.99 04/14/2014 1104   CREATININE 1.09 06/10/2013 1228   CALCIUM 9.3 04/14/2014 1104   PROT 6.6 04/14/2014 1104   ALBUMIN 4.1 04/14/2014 1104   AST 32 04/14/2014 1104   ALT 21 04/14/2014 1104   ALT 14 01/09/2014 1138   ALKPHOS 103 04/14/2014 1104   BILITOT 3.5* 04/14/2014 1104   GFRNONAA 78* 04/22/2013 0643   GFRAA >90 04/22/2013 0643    Lab Results  Component Value Date   INR 3.1 08/18/2014   INR 2.5 07/07/2014   INR 3.0 06/02/2014   PROTIME 17.1 07/01/2008   CBC    Component Value Date/Time   WBC 4.2 04/14/2014 1104   RBC 5.34 04/14/2014 1104   HGB 16.2 04/14/2014 1104   HCT 46.9 04/14/2014 1104   PLT 77.0* 04/14/2014 1104   MCV 87.8 04/14/2014 1104   MCH 21.6* 08/30/2013 1500   MCHC 34.5 04/14/2014 1104   RDW 14.4 04/14/2014 1104   LYMPHSABS 1.2 04/14/2014 1104  MONOABS 0.8 04/14/2014 1104   EOSABS 0.1 04/14/2014 1104   BASOSABS 0.0 04/14/2014 1104   EXAM: ULTRASOUND ABDOMEN COMPLETE   COMPARISON:  Abdominal ultrasound of January 02, 2014   FINDINGS: Gallbladder: The gallbladder is surgically absent.   Common bile duct: Diameter: 4 mm   Liver: The hepatic echotexture mildly increased and heterogeneous. The surface contour of the liver is mildly irregular. There is no intrahepatic ductal dilation or focal mass.   IVC: Visualization is limited by bowel gas.   Pancreas:  Visualization is limited by bowel gas. The pancreatic duct measures 4 mm in diameter.   Spleen: There is splenomegaly with maximal dimension of 15.6 cm with splenic volume of 702 cm.   Right Kidney: Length: 11.2 cm. The renal cortical echotexture is mildly increased but remains lower than that of the adjacent liver. There is no focal mass nor hydronephrosis.   Left Kidney: Length: 11.4 cm. The renal cortical echotexture is mildly increased. There is no focal mass or hydronephrosis.   Abdominal aorta: Evaluation of the abdominal aorta was limited by bowel gas. There is mural calcification but no definite aneurysm.   Other findings: There is no ascites.   IMPRESSION: 1. Splenomegaly. 2. Subtle surface contour irregularity and heterogeneous Lee increased hepatic echotexture are consistent with cirrhosis. There is no hepatic mass. The gallbladder is surgically absent. 3. The kidneys exhibit mildly increased cortical echotexture without evidence of obstruction or parenchymal masses. This may reflect underlying medical renal disease. 4. Evaluation of the pancreas, IVC, and abdominal aorta is limited by bowel gas. 5. There is no ascites.     Electronically Signed   By: [name]  Martinique M.D.   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  1. Cryptogenic cirrhosis with portal HTN -- cryptogenic cirrhosis most likely secondary to nonalcoholic steatohepatitis. Evidence of portal hypertension by thrombocytopenia. He does have mild elevation in bilirubin though no obvious icterus. Overall remains well compensated at this time. Importance of alcohol avoidance again recommended, he's not having problems with this --[place] screening --  up-to-date with last ultrasound in June 2016 which was reviewed today. Repeat in December 2016 --Variceal screening -- no history of esophageal varices last endoscopy March 2015 repeat would be due March 2018 --Low sodium diet --Monitor for edema, ascites, jaundice --Flu vaccine recommended this late summer/fall --Repeat CBC, CMP at follow-up --Follow-up in 6 months  2. Crohn's disease -- asymptomatic. Mild erythema and edema in the ascending colon March 2015. Biopsies not performed due to chronic anticoagulation. Given overall medical conditions I'm hesitant to escalate therapy in the asymptomatic state. He agrees with this plan  3. Intermittent loose stools -- happening less than every 2-3 months. Benefiber daily seems to help. Continue Benefiber daily. As needed Imodium per box instruction  Follow-up in 6 months

## 2014-08-25 DIAGNOSIS — N3090 Cystitis, unspecified without hematuria: Secondary | ICD-10-CM | POA: Diagnosis not present

## 2014-08-25 DIAGNOSIS — N5203 Combined arterial insufficiency and corporo-venous occlusive erectile dysfunction: Secondary | ICD-10-CM | POA: Diagnosis not present

## 2014-08-25 DIAGNOSIS — N401 Enlarged prostate with lower urinary tract symptoms: Secondary | ICD-10-CM | POA: Diagnosis not present

## 2014-09-04 ENCOUNTER — Telehealth: Payer: Self-pay | Admitting: Family Medicine

## 2014-09-04 ENCOUNTER — Encounter: Payer: Self-pay | Admitting: Family Medicine

## 2014-09-04 ENCOUNTER — Ambulatory Visit (INDEPENDENT_AMBULATORY_CARE_PROVIDER_SITE_OTHER): Payer: Medicare Other | Admitting: Family Medicine

## 2014-09-04 VITALS — BP 120/68 | HR 61 | Temp 97.3°F | Ht 72.0 in | Wt 203.0 lb

## 2014-09-04 DIAGNOSIS — I255 Ischemic cardiomyopathy: Secondary | ICD-10-CM | POA: Diagnosis not present

## 2014-09-04 DIAGNOSIS — D62 Acute posthemorrhagic anemia: Secondary | ICD-10-CM

## 2014-09-04 DIAGNOSIS — D696 Thrombocytopenia, unspecified: Secondary | ICD-10-CM | POA: Diagnosis not present

## 2014-09-04 DIAGNOSIS — I10 Essential (primary) hypertension: Secondary | ICD-10-CM | POA: Diagnosis not present

## 2014-09-04 DIAGNOSIS — E538 Deficiency of other specified B group vitamins: Secondary | ICD-10-CM | POA: Diagnosis not present

## 2014-09-04 DIAGNOSIS — K5090 Crohn's disease, unspecified, without complications: Secondary | ICD-10-CM

## 2014-09-04 DIAGNOSIS — E559 Vitamin D deficiency, unspecified: Secondary | ICD-10-CM | POA: Diagnosis not present

## 2014-09-04 DIAGNOSIS — K219 Gastro-esophageal reflux disease without esophagitis: Secondary | ICD-10-CM

## 2014-09-04 DIAGNOSIS — E876 Hypokalemia: Secondary | ICD-10-CM

## 2014-09-04 DIAGNOSIS — E8809 Other disorders of plasma-protein metabolism, not elsewhere classified: Secondary | ICD-10-CM | POA: Diagnosis not present

## 2014-09-04 DIAGNOSIS — E785 Hyperlipidemia, unspecified: Secondary | ICD-10-CM

## 2014-09-04 LAB — COMPREHENSIVE METABOLIC PANEL
ALT: 18 U/L (ref 0–53)
AST: 28 U/L (ref 0–37)
Albumin: 3.7 g/dL (ref 3.5–5.2)
Alkaline Phosphatase: 103 U/L (ref 39–117)
BILIRUBIN TOTAL: 2.1 mg/dL — AB (ref 0.2–1.2)
BUN: 9 mg/dL (ref 6–23)
CALCIUM: 9.3 mg/dL (ref 8.4–10.5)
CO2: 32 mEq/L (ref 19–32)
Chloride: 103 mEq/L (ref 96–112)
Creatinine, Ser: 0.86 mg/dL (ref 0.40–1.50)
GFR: 90.65 mL/min (ref >60.00)
Glucose, Bld: 95 mg/dL (ref 70–99)
Potassium: 4.1 mEq/L (ref 3.5–5.1)
SODIUM: 139 meq/L (ref 135–145)
TOTAL PROTEIN: 6 g/dL (ref 6.0–8.3)

## 2014-09-04 LAB — VITAMIN B12: VITAMIN B 12: 958 pg/mL — AB (ref 211–911)

## 2014-09-04 LAB — CBC
HCT: 45 % (ref 39.0–52.0)
Hemoglobin: 15.3 g/dL (ref 13.0–17.0)
MCHC: 33.9 g/dL (ref 30.0–36.0)
MCV: 89.3 fl (ref 78.0–100.0)
Platelets: 69 10*3/uL — ABNORMAL LOW (ref 150.0–400.0)
RBC: 5.04 Mil/uL (ref 4.22–5.81)
RDW: 14.9 % (ref 11.5–15.5)
WBC: 5.1 10*3/uL (ref 4.0–10.5)

## 2014-09-04 LAB — LIPID PANEL
CHOL/HDL RATIO: 3
Cholesterol: 140 mg/dL (ref 0–200)
HDL: 48.7 mg/dL (ref >39.00)
LDL CALC: 62 mg/dL (ref 0–99)
NonHDL: 91.35
Triglycerides: 145 mg/dL (ref 0.0–149.0)
VLDL: 29 mg/dL (ref 0.0–40.0)

## 2014-09-04 LAB — TSH: TSH: 2.06 u[IU]/mL (ref 0.35–4.50)

## 2014-09-04 LAB — VITAMIN D 25 HYDROXY (VIT D DEFICIENCY, FRACTURES): VITD: 33.6 ng/mL (ref 30.00–100.00)

## 2014-09-04 MED ORDER — ZOSTER VACCINE LIVE 19400 UNT/0.65ML ~~LOC~~ SOLR: 0.6500 mL | Freq: Once | SUBCUTANEOUS | Status: DC

## 2014-09-04 MED ORDER — SIMVASTATIN 20 MG PO TABS: ORAL_TABLET | ORAL | Status: DC

## 2014-09-04 MED ORDER — CARVEDILOL 6.25 MG PO TABS: ORAL_TABLET | ORAL | Status: DC

## 2014-09-04 MED ORDER — PNEUMOCOCCAL 13-VAL CONJ VACC IM SUSP: 0.5000 mL | INTRAMUSCULAR | Status: DC

## 2014-09-04 NOTE — Progress Notes (Signed)
Pre visit review using our clinic review tool, if applicable. No additional management support is needed unless otherwise documented below in the visit note. 

## 2014-09-04 NOTE — Telephone Encounter (Signed)
Caller name: Star,Jackie Relation to pt: spouse  Call back number: [phone]   Reason for call:  As per 09/04/14 AVS: Return in about 3 months (around 12/05/2014) for 3-4 mo Medicare Wellness. Patient would like to know if its okay to wait until next available morning which is in February.  Please advise

## 2014-09-04 NOTE — Patient Instructions (Signed)

## 2014-09-04 NOTE — Telephone Encounter (Signed)
Let's set him up  For 3 month AWV with RN if the are willing and then a 4 to  Month visit with me with labs

## 2014-09-08 NOTE — Telephone Encounter (Signed)
Appointment scheduled.

## 2014-09-10 ENCOUNTER — Other Ambulatory Visit: Payer: Self-pay | Admitting: Dermatology

## 2014-09-10 DIAGNOSIS — L821 Other seborrheic keratosis: Secondary | ICD-10-CM | POA: Diagnosis not present

## 2014-09-10 DIAGNOSIS — D1801 Hemangioma of skin and subcutaneous tissue: Secondary | ICD-10-CM | POA: Diagnosis not present

## 2014-09-10 DIAGNOSIS — D0462 Carcinoma in situ of skin of left upper limb, including shoulder: Secondary | ICD-10-CM | POA: Diagnosis not present

## 2014-09-10 DIAGNOSIS — L570 Actinic keratosis: Secondary | ICD-10-CM | POA: Diagnosis not present

## 2014-09-10 DIAGNOSIS — Z85820 Personal history of malignant melanoma of skin: Secondary | ICD-10-CM | POA: Diagnosis not present

## 2014-09-10 DIAGNOSIS — C44629 Squamous cell carcinoma of skin of left upper limb, including shoulder: Secondary | ICD-10-CM | POA: Diagnosis not present

## 2014-09-10 DIAGNOSIS — Z08 Encounter for follow-up examination after completed treatment for malignant neoplasm: Secondary | ICD-10-CM | POA: Diagnosis not present

## 2014-09-14 NOTE — Assessment & Plan Note (Signed)
Avoid offending foods, start probiotics. Do not eat large meals in late evening and consider raising head of bed.  

## 2014-09-14 NOTE — Assessment & Plan Note (Signed)
Continue OTC Vitamin D 1000 IU daily

## 2014-09-14 NOTE — Assessment & Plan Note (Signed)
Resolved. Vitamin B12 levels mildly elevated. Encouraged to decrease supplements.

## 2014-09-14 NOTE — Assessment & Plan Note (Signed)
No recent flares 

## 2014-09-14 NOTE — Assessment & Plan Note (Signed)
Well controlled, no changes to meds. Encouraged heart healthy diet such as the DASH diet and exercise as tolerated.  °

## 2014-09-14 NOTE — Progress Notes (Signed)
[name] [id number] [birth year] 09/14/2014      Progress Note New Patient  Subjective  Chief Complaint  Chief Complaint  Patient presents with  . Follow-up    HPI  Patient is a 81 year old male in today for routine medical care. Patient is in for follow-up doing fairly well. He did have a recent fall and has some persistent bruising on his left foot but no significant pain. No other recent complaints. No recent febrile illness. Denies CP/palp/SOB/HA/congestion/fevers/GI or GU c/o. Taking meds as prescribed  Past Medical History  Diagnosis Date  . Dizziness     Positional dizziness  . Hypertension   . Ischemic cardiomyopathy     CABG 1994 CAD catherization 1996.[name] occluded vein graft to the diagnol, other grafts patent/  catherization 2006, no PCI/ nuclear.[name] october 2011.[name] extensive scar anteroseptal and apical.. no ischemia    . CHF (congestive heart failure)     chronic systolic  . Mitral regurgitation     mild.[name] echo november 2011  EF 35%...echo.. november 2009/ ef 35%.[name] echo november 2011  . Sinus bradycardia   . Chronotropic incompetence   . Biventricular implantable cardiac defibrillator in situ     GDT CONTAK H170-MADIT-CRT-EXPLANTED 2011 implanted defibrillator- [employer] cognis, model n119-2001 Dr. Caryl Comes (11/28/2009)  . LBBB (left bundle branch block)   . Hyperlipemia   . GERD (gastroesophageal reflux disease)   . Colonic polyp 2010    pathology not clear.   . Crohn's disease   . Acute vascular insufficiency of intestine     Mesenteric embolus...surgery 2003  . Ventral hernia   . Hypertrophy of prostate with urinary obstruction and other lower urinary tract symptoms (LUTS)   . Degenerative joint disease   . Polymyalgia rheumatica   . Tremor   . Anxiety   . Shingles   . Vitamin B12 deficiency   . CAD (coronary artery disease)     CABG 1994  /   catheterization 1996, occluded vein graft to the diagonal, other grafts patent  /   catheterization 2006, no PCI  /   nuclear, October, 2011, extensive scar anteroseptal and apical, no ischemia  . Hx of CABG     1994  . Ejection fraction     EF 35%, echo, November, 2011  //   Is 35-40%, echo,  September 23, 2011  . Warfarin anticoagulation     Coumadin therapy for mesenteric embolus 2003  . Carotid artery disease     Doppler, February,  2012, 0-39% bilateral, recommended followup 1 year  . Edema leg     Venous Dopplers 8/13: No DVT bilaterally  . Breast tenderness     Spironolactone was stopped and eplerinone started. Patient feels better  . Thrombocytopenia 2008  . Edema 09/02/2013  . Acute bronchitis 09/02/2013  . Iron deficiency anemia   . Thrush   . Colon polyp     Past Surgical History  Procedure Laterality Date  . Coronary artery bypass graft  1994  . Laparoscopic cholecystectomy  2002  . Elap w/ superior mesenteric artery embolectomy  1/03    by Dr. Jennette Banker  . Repair of incarcerated ventral hernia and lysis of adhesions  11/03    by Dr. Betsy Pries  . Implantation of biventric cardioverter-defibrillatorr      by Dr. Lovena Le in 8/06 [employer] [place] 2011  . Implantation of guidant cognis device      model [id number]  . Left inguinal hernia repair with mesh  6/08  by Dr. Betsy Pries  . Prostate surgery  08/2012    by urology in [place]  . Esophagogastroduodenoscopy N/A 04/18/2013    Procedure: ESOPHAGOGASTRODUODENOSCOPY (EGD);  Surgeon: Jerene Bears, MD;  [facility] END[facility];  Service: Endoscopy;  Laterality: N/A;  . Colonoscopy N/A 04/19/2013    Procedure: COLONOSCOPY;  Surgeon: Jerene Bears, MD;  Location: [facility] ENDOSCOPY;  Service: Endoscopy;  Laterality: N/A;    Family History  Problem Relation Age of Onset  . Heart disease Mother   . Hypertension    . Hyperlipidemia    . Tremor Sister   . Heart disease Sister     heart failure and disease  . Heart disease Father 52    Deceased   . Hypertension Father   . Appendicitis Brother     ruptured  . Diabetes Son   . Heart  disease Sister   . Hypertension Sister   . Dementia Sister   . Heart disease Sister     pedal edema  . Hypertension Brother   . Kidney disease Brother   . Myasthenia gravis Brother   . Cirrhosis Brother   . Cancer Brother     leukemia    Social History   Social History  . Marital Status: Married    Spouse Name: Kennyth Lose  . Number of Children: 3  . 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 Smoking status: Former Smoker -- 1.00 packs/day for 20 years    Types: Cigarettes    Quit date: 01/31/1973  . Smokeless tobacco: Never Used  . Alcohol Use: 0.0 oz/week    0 Standard drinks or equivalent per week     Comment: occasional  . Drug Use: No  . Sexual Activity: No     Comment: lives with wife, no dietary restrictions   Other Topics Concern  . Not on file   Social History Narrative   Lives in [place], [place] with wife.     Current Outpatient Prescriptions on File Prior to Visit  Medication Sig Dispense Refill  . cholecalciferol (VITAMIN D) 1000 UNITS tablet Take 1,000 Units by mouth daily.     . Cyanocobalamin (B-12) 1000 MCG CAPS Take 1,000 mcg by mouth daily.     . furosemide (LASIX) 40 MG tablet Take 1 tablet by mouth two  times daily 180 tablet 3  . KLOR-CON M20 20 MEQ tablet TAKE 1 TABLET BY MOUTH DAILY. 30 tablet 3  . lisinopril (PRINIVIL,ZESTRIL) 10 MG tablet Take 1 tablet by mouth  daily 90 tablet 3  . pantoprazole (PROTONIX) 40 MG tablet Take 1 tablet by mouth  every day at 12 noon 90 tablet 3  . warfarin (COUMADIN) 5 MG tablet Take as directed by  anticoagulation clinic 150 tablet 1   No current facility-administered medications on file prior to visit.    No Known Allergies  Review of Systems  Review of Systems  Constitutional: Negative for fever and malaise/fatigue.  HENT: Negative for congestion.   Eyes: Negative for discharge.  Respiratory: Negative for shortness of  breath.   Cardiovascular: Negative for chest pain, palpitations and leg swelling.  Gastrointestinal: Negative for nausea and abdominal pain.  Genitourinary: Negative for dysuria.  Musculoskeletal: Positive for falls.  Skin: Negative for rash.  Neurological: Negative for loss of consciousness and headaches.  Endo/Heme/Allergies: Negative for environmental allergies.  Psychiatric/Behavioral: Negative for depression. The  patient is not nervous/anxious.     Objective  BP 120/68 mmHg  Pulse 61  Temp(Src) 97.3 F (36.3 C) (Oral)  Ht 6' (1.829 m)  Wt 203 lb (92.08 kg)  BMI 27.53 kg/m2  SpO2 93%  Physical Exam  Physical Exam  Constitutional: He is oriented to person, place, and time and well-developed, well-nourished, and in no distress. No distress.  HENT:  Head: Normocephalic and atraumatic.  Eyes: Conjunctivae are normal.  Neck: Neck supple. No thyromegaly present.  Cardiovascular: Normal rate, regular rhythm and normal heart sounds.   Pulmonary/Chest: Effort normal and breath sounds normal. No respiratory distress.  Abdominal: He exhibits no distension and no mass. There is no tenderness.  Musculoskeletal: He exhibits no edema.  Ecchymosis left foot.  Neurological: He is alert and oriented to person, place, and time.  Skin: Skin is warm.  Psychiatric: Memory, affect and judgment normal.       Assessment & Plan  GERD Avoid offending foods, start probiotics. Do not eat large meals in late evening and consider raising head of bed.   Vitamin D deficiency Continue OTC Vitamin D 1000 IU daily   Hypertension Well controlled, no changes to meds. Encouraged heart healthy diet such as the DASH diet and exercise as tolerated.   Crohn's disease No recent flares.  Acute blood loss anemia Resolved. Vitamin B12 levels mildly elevated. Encouraged to decrease supplements.  Vitamin B 12 deficiency Overly treated drop supplements to less doses weekly, skip 2 doses each  week

## 2014-09-14 NOTE — Assessment & Plan Note (Addendum)
Overly treated drop supplements to less doses weekly, skip 2 doses each week

## 2014-09-22 ENCOUNTER — Ambulatory Visit (INDEPENDENT_AMBULATORY_CARE_PROVIDER_SITE_OTHER): Payer: Medicare Other

## 2014-09-22 DIAGNOSIS — K550 Acute vascular disorders of intestine: Secondary | ICD-10-CM | POA: Diagnosis not present

## 2014-09-22 DIAGNOSIS — K55069 Acute infarction of intestine, part and extent unspecified: Secondary | ICD-10-CM

## 2014-09-22 LAB — POCT INR: INR: 2.4

## 2014-09-29 ENCOUNTER — Other Ambulatory Visit: Payer: Self-pay | Admitting: Cardiology

## 2014-09-29 DIAGNOSIS — I6523 Occlusion and stenosis of bilateral carotid arteries: Secondary | ICD-10-CM

## 2014-10-07 ENCOUNTER — Encounter (HOSPITAL_COMMUNITY): Payer: Medicare Other

## 2014-10-07 ENCOUNTER — Encounter: Payer: Self-pay | Admitting: *Deleted

## 2014-10-07 ENCOUNTER — Ambulatory Visit (HOSPITAL_COMMUNITY)
Admission: RE | Admit: 2014-10-07 | Discharge: 2014-10-07 | Disposition: A | Discharge status: Home / Self Care | Payer: Medicare Other | Source: Ambulatory Visit | Attending: Cardiovascular Disease | Admitting: Cardiovascular Disease

## 2014-10-07 DIAGNOSIS — I6523 Occlusion and stenosis of bilateral carotid arteries: Secondary | ICD-10-CM | POA: Diagnosis not present

## 2014-10-07 DIAGNOSIS — I10 Essential (primary) hypertension: Secondary | ICD-10-CM | POA: Diagnosis not present

## 2014-10-07 DIAGNOSIS — E785 Hyperlipidemia, unspecified: Secondary | ICD-10-CM | POA: Insufficient documentation

## 2014-10-08 ENCOUNTER — Encounter: Payer: Self-pay | Admitting: *Deleted

## 2014-10-09 ENCOUNTER — Encounter: Payer: Medicare Other | Admitting: Internal Medicine

## 2014-10-13 ENCOUNTER — Encounter: Payer: Self-pay | Admitting: Cardiology

## 2014-10-13 ENCOUNTER — Ambulatory Visit (INDEPENDENT_AMBULATORY_CARE_PROVIDER_SITE_OTHER): Payer: Medicare Other | Admitting: Internal Medicine

## 2014-10-13 ENCOUNTER — Ambulatory Visit (INDEPENDENT_AMBULATORY_CARE_PROVIDER_SITE_OTHER): Payer: Medicare Other | Admitting: Cardiology

## 2014-10-13 ENCOUNTER — Encounter: Payer: Self-pay | Admitting: Internal Medicine

## 2014-10-13 ENCOUNTER — Other Ambulatory Visit: Payer: Self-pay | Admitting: *Deleted

## 2014-10-13 VITALS — BP 120/76 | HR 60 | Ht 72.0 in | Wt 205.6 lb

## 2014-10-13 VITALS — BP 126/76 | HR 60 | Ht 72.0 in | Wt 204.8 lb

## 2014-10-13 DIAGNOSIS — Z7901 Long term (current) use of anticoagulants: Secondary | ICD-10-CM | POA: Diagnosis not present

## 2014-10-13 DIAGNOSIS — I255 Ischemic cardiomyopathy: Secondary | ICD-10-CM

## 2014-10-13 DIAGNOSIS — I5022 Chronic systolic (congestive) heart failure: Secondary | ICD-10-CM

## 2014-10-13 DIAGNOSIS — I25810 Atherosclerosis of coronary artery bypass graft(s) without angina pectoris: Secondary | ICD-10-CM | POA: Diagnosis not present

## 2014-10-13 MED ORDER — SIMVASTATIN 20 MG PO TABS: ORAL_TABLET | ORAL | Status: DC

## 2014-10-13 MED ORDER — WARFARIN SODIUM 5 MG PO TABS: 5.0000 mg | ORAL_TABLET | ORAL | Status: DC

## 2014-10-13 NOTE — Assessment & Plan Note (Signed)
Patient underwent CABG in 1994. Catheterization in 1996 reviewed occluded vein graft to the diagonal. Other grafts were patent. Catheterization in 2006 showed that the coronaries were stable. Nuclear October, 2011 revealed extensive scar in the anteroseptal wall and apex. There was no ischemia. No further workup.

## 2014-10-13 NOTE — Assessment & Plan Note (Signed)
The patient's medications have been titrated optimally. No further workup.

## 2014-10-13 NOTE — Assessment & Plan Note (Signed)
Volume status is stable today. No further workup.

## 2014-10-13 NOTE — Progress Notes (Signed)
Cardiology Office Note   Date:  10/13/2014   ID:  [name], DOB 1931/08/17, MRN [id number]  PCP:  Penni Homans, MD  Cardiologist:  Dola Argyle, MD   Chief Complaint  Patient presents with  . Appointment    Follow-up CHF      History of Present Illness: [name] is a 81 y.o. male who presents today to follow-up CHF. He is stable at this time. He has a multitude of medical problems. Fortunately he is relatively stable as of today. He has systolic heart failure. He is not currently having any PND or orthopnea. He's not having any chest pain.    Past Medical History  Diagnosis Date  . Dizziness     Positional dizziness  . Hypertension   . Ischemic cardiomyopathy     CABG 1994 CAD catherization 1996.[name] occluded vein graft to the diagnol, other grafts patent/  catherization 2006, no PCI/ nuclear.[name] october 2011.[name] extensive scar anteroseptal and apical.. no ischemia    . CHF (congestive heart failure)     chronic systolic  . Mitral regurgitation     mild.[name] echo november 2011  EF 35%...echo.. november 2009/ ef 35%.[name] echo november 2011  . Sinus bradycardia   . Chronotropic incompetence   . Biventricular implantable cardiac defibrillator in situ     GDT CONTAK H170-MADIT-CRT-EXPLANTED 2011 implanted defibrillator- [employer] cognis, model n119-2001 Dr. Caryl Comes (11/28/2009)  . LBBB (left bundle branch block)   . Hyperlipemia   . GERD (gastroesophageal reflux disease)   . Colonic polyp 2010    pathology not clear.   . Crohn's disease   . Acute vascular insufficiency of intestine     Mesenteric embolus...surgery 2003  . Ventral hernia   . Hypertrophy of prostate with urinary obstruction and other lower urinary tract symptoms (LUTS)   . Degenerative joint disease   . Polymyalgia rheumatica   . Tremor   . Anxiety   . Shingles   . Vitamin B12 deficiency   . CAD (coronary artery disease)     CABG 1994  /   catheterization 1996, occluded vein graft to the diagonal, other  grafts patent  /   catheterization 2006, no PCI  /  nuclear, October, 2011, extensive scar anteroseptal and apical, no ischemia  . Hx of CABG     1994  . Ejection fraction     EF 35%, echo, November, 2011  //   Is 35-40%, echo,  September 23, 2011  . Warfarin anticoagulation     Coumadin therapy for mesenteric embolus 2003  . Carotid artery disease     Doppler, February,  2012, 0-39% bilateral, recommended followup 1 year  . Edema leg     Venous Dopplers 8/13: No DVT bilaterally  . Breast tenderness     Spironolactone was stopped and eplerinone started. Patient feels better  . Thrombocytopenia 2008  . Edema 09/02/2013  . Acute bronchitis 09/02/2013  . Iron deficiency anemia   . Thrush   . Colon polyp     Past Surgical History  Procedure Laterality Date  . Coronary artery bypass graft  1994  . Laparoscopic cholecystectomy  2002  . Elap w/ superior mesenteric artery embolectomy  1/03    by Dr. Jennette Banker  . Repair of incarcerated ventral hernia and lysis of adhesions  11/03    by Dr. Betsy Pries  . Implantation of biventric cardioverter-defibrillatorr      by Dr. Lovena Le in 8/06 [employer] 2011  . Implantation of  guidant cognis device      model [id number]  . Left inguinal hernia repair with mesh  6/08    by Dr. Betsy Pries  . Prostate surgery  08/2012    by urology in [place]  . Esophagogastroduodenoscopy N/A 04/18/2013    Procedure: ESOPHAGOGASTRODUODENOSCOPY (EGD);  Surgeon: Jerene Bears, MD;  Location: [facility] ENDOSCOPY;  Service: Endoscopy;  Laterality: N/A;  . Colonoscopy N/A 04/19/2013    Procedure: COLONOSCOPY;  Surgeon: Jerene Bears, MD;  Location: [facility] ENDOSCOPY;  Service: Endoscopy;  Laterality: N/A;    Patient Active Problem List   Diagnosis Date Noted  . Cirrhosis, nonalcoholic 53/61/4431  . Sinusitis 03/10/2014  . Allergic rhinitis 09/09/2013  . Acute bronchitis 09/02/2013  . Hypokalemia 06/18/2013  . Hypoalbuminemia 06/16/2013  . Left ankle pain 06/07/2013  .  Acute blood loss anemia 04/17/2013  . Acute encephalopathy 04/17/2013  . UGIB (upper gastrointestinal bleed) 04/16/2013  . Warfarin-induced coagulopathy 04/16/2013  . Thrombocytopenia 11/30/2011  . Ejection fraction   . Edema leg   . OSA (obstructive sleep apnea) 09/16/2011  . CAD (coronary artery disease)   . Hx of CABG   . Hypertension   . Chronic systolic heart failure   . Mitral regurgitation   . Chronotropic incompetence   . Hyperlipemia   . Crohn's disease   . Ventral hernia   . Tremor   . Warfarin anticoagulation   . Carotid artery disease   . 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 IMPLANTABLE DEFIBRILLATOR, GDT CONTAK H170-MADIT-CRT 12/09/2009  . HYPERTROPHY PROSTATE W/UR OBST & OTH LUTS 03/31/2009  . Vitamin B 12 deficiency 11/06/2008  . Vitamin D deficiency 11/06/2008  . Cardiomyopathy, ischemic 09/15/2008  . LBBB 06/25/2008  . Fatigue 06/25/2008  . COLONIC POLYPS 03/23/2007  . POLYMYALGIA RHEUMATICA 03/23/2007  . Anxiety state 02/21/2007  . GERD 02/21/2007  . DEGENERATIVE JOINT DISEASE 02/21/2007      Current Outpatient Prescriptions  Medication Sig Dispense Refill  . carvedilol (COREG) 6.25 MG tablet Take 1 tablet by mouth  twice a day with meals 180 tablet 2  . cholecalciferol (VITAMIN D) 1000 UNITS tablet Take 1,000 Units by mouth daily.     . furosemide (LASIX) 40 MG tablet Take 1 tablet by mouth two  times daily 180 tablet 3  . KLOR-CON M20 20 MEQ tablet TAKE 1 TABLET BY MOUTH DAILY. 30 tablet 3  . lisinopril (PRINIVIL,ZESTRIL) 10 MG tablet Take 1 tablet by mouth  daily 90 tablet 3  . pantoprazole (PROTONIX) 40 MG tablet Take 1 tablet by mouth  every day at 12 noon 90 tablet 3  . pneumococcal 13-valent conjugate vaccine (PREVNAR 13) SUSP injection Inject 0.5 mLs into the muscle tomorrow at 10 am. 0.5 mL 0  . simvastatin (ZOCOR) 20 MG tablet Take 1 tablet by mouth at  bedtime 90 tablet 1  . vitamin B-12 (CYANOCOBALAMIN) 1000 MCG tablet Take 1,000 mcg by mouth  every other day.    . warfarin (COUMADIN) 5 MG tablet Take as directed by  anticoagulation clinic 150 tablet 1  . zoster vaccine live, PF, (ZOSTAVAX) 54008 UNT/0.65ML injection Inject 19,400 Units into the skin once. 1 each 0   No current facility-administered medications for this visit.    Allergies:   Review of patient's allergies indicates no known allergies.    Social History:  The patient  reports that he quit smoking about 41 years ago. His smoking use included Cigarettes. He has a 20 pack-year smoking history. He has never used smokeless tobacco.  He reports that he drinks alcohol. He reports that he does not use illicit drugs.   Family History:  The patient's family history includes Appendicitis in his brother; Cirrhosis in his brother; Dementia in his sister; Diabetes in his son; Edema in his sister; Heart disease in his mother, sister, sister, and sister; Heart disease (age of onset: 34) in his father; Heart failure in his sister; Hyperlipidemia in an other family member; Hypertension in his brother, father, sister, and another family member; Kidney disease in his brother; Leukemia in his brother; Myasthenia gravis in his brother; Tremor in his sister.    ROS:  Please see the history of present illness.     Patient denies fever, chills, headache, sweats, rash, change in vision, change in hearing, chest pain, cough, nausea or vomiting, urinary symptoms. All other systems are reviewed and are negative.   PHYSICAL EXAM: VS:  BP 126/76 mmHg  Pulse 60  Ht 6' (1.829 m)  Wt 204 lb 12.8 oz (92.897 kg)  BMI 27.77 kg/m2 , The patient is oriented to person time and place. Affect is normal. He is here with his wife. Head is atraumatic. Sclera and conjunctiva are normal. There is no jugular venous distention. Lungs reveal scattered rhonchi. Cardiac exam reveals an S1 and S2. The abdomen is protuberant but soft. There is no significant peripheral edema. There are no musculoskeletal deformities.  There are no skin rashes.   EKG:   EKG is done today and reviewed by me. The rhythm is paced.   Recent Labs: 09/04/2014: ALT 18; BUN 9; Creatinine, Ser 0.86; Hemoglobin 15.3; Platelets 69.0*; Potassium 4.1; Sodium 139; TSH 2.06    Lipid Panel    Component Value Date/Time   CHOL 140 09/04/2014 1052   TRIG 145.0 09/04/2014 1052   HDL 48.70 09/04/2014 1052   CHOLHDL 3 09/04/2014 1052   VLDL 29.0 09/04/2014 1052   LDLCALC 62 09/04/2014 1052   LDLDIRECT 81.6 12/30/2013 1213      Wt Readings from Last 3 Encounters:  10/13/14 204 lb 12.8 oz (92.897 kg)  09/04/14 203 lb (92.08 kg)  08/20/14 199 lb (90.266 kg)      Current medicines are reviewed  The patient understands his medications.     ASSESSMENT AND PLAN:

## 2014-10-13 NOTE — Progress Notes (Signed)
Patient Care Team: [name], MD as PCP - General (Family Medicine) Carlena Bjornstad, MD as Consulting Physician (Cardiology) Jerene Bears, MD as Consulting Physician (Gastroenterology)   HPI  [name] is a 81 y.o. male ICD implanted for primary prevention in the setting of ischemic coronary disease with prior CABG as part of the MADIT-CRT trial.   His last catheterization in 2006 demonstrated a total right and 80% marginal. Vein graft to the diagonal was occluded and other bypasses were open; ejection fraction 2007 was about 35%  Nuclear scan Oct 2011>>scar no ischemia    The patient denies chest pain, shortness of breath, nocturnal dyspnea, orthopnea or peripheral edema.  There have been no palpitations, lightheadedness or syncope.   His wife says his biggest problem is fatigue. There no new medications.   Past Medical History  Diagnosis Date  . Dizziness     Positional dizziness  . Hypertension   . Ischemic cardiomyopathy     CABG 1994 CAD catherization 1996.[name] occluded vein graft to the diagnol, other grafts patent/  catherization 2006, no PCI/ nuclear.[name] october 2011.[name] extensive scar anteroseptal and apical.. no ischemia    . CHF (congestive heart failure)     chronic systolic  . Mitral regurgitation     mild.[name] echo november 2011  EF 35%...echo.. november 2009/ ef 35%.[name] echo november 2011  . Sinus bradycardia   . Chronotropic incompetence   . Biventricular implantable cardiac defibrillator in situ     GDT CONTAK H170-MADIT-CRT-EXPLANTED 2011 implanted defibrillator- [employer] cognis, model n119-2001 Dr. Caryl Comes (11/28/2009)  . LBBB (left bundle branch block)   . Hyperlipemia   . GERD (gastroesophageal reflux disease)   . Colonic polyp 2010    pathology not clear.   . Crohn's disease   . Acute vascular insufficiency of intestine     Mesenteric embolus...surgery 2003  . Ventral hernia   . Hypertrophy of prostate with urinary obstruction and other lower  urinary tract symptoms (LUTS)   . Degenerative joint disease   . Polymyalgia rheumatica   . Tremor   . Anxiety   . Shingles   . Vitamin B12 deficiency   . CAD (coronary artery disease)     CABG 1994  /   catheterization 1996, occluded vein graft to the diagonal, other grafts patent  /   catheterization 2006, no PCI  /  nuclear, October, 2011, extensive scar anteroseptal and apical, no ischemia  . Hx of CABG     1994  . Ejection fraction     EF 35%, echo, November, 2011  //   Is 35-40%, echo,  September 23, 2011  . Warfarin anticoagulation     Coumadin therapy for mesenteric embolus 2003  . Carotid artery disease     Doppler, February,  2012, 0-39% bilateral, recommended followup 1 year  . Edema leg     Venous Dopplers 8/13: No DVT bilaterally  . Breast tenderness     Spironolactone was stopped and eplerinone started. Patient feels better  . Thrombocytopenia 2008  . Edema 09/02/2013  . Acute bronchitis 09/02/2013  . Iron deficiency anemia   . Thrush   . Colon polyp     Past Surgical History  Procedure Laterality Date  . Coronary artery bypass graft  1994  . Laparoscopic cholecystectomy  2002  . Elap w/ superior mesenteric artery embolectomy  1/03    by Dr. Jennette Banker  . Repair of incarcerated ventral hernia and lysis of adhesions  11/03    by Dr. Betsy Pries  . Implantation of biventric cardioverter-defibrillatorr      by Dr. Lovena Le in 8/06 [employer] 2011  . Implantation of guidant cognis device      model [id number]  . Left inguinal hernia repair with mesh  6/08    by Dr. Betsy Pries  . Prostate surgery  08/2012    by urology in [place]  . Esophagogastroduodenoscopy N/A 04/18/2013    Procedure: ESOPHAGOGASTRODUODENOSCOPY (EGD);  Surgeon: Jerene Bears, MD;  Location: [facility] ENDOSCOPY;  Service: Endoscopy;  Laterality: N/A;  . Colonoscopy N/A 04/19/2013    Procedure: COLONOSCOPY;  Surgeon: Jerene Bears, MD;  Location: [facility] ENDOSCOPY;  Service: Endoscopy;  Laterality: N/A;     Current Outpatient Prescriptions  Medication Sig Dispense Refill  . carvedilol (COREG) 6.25 MG tablet Take 1 tablet by mouth  twice a day with meals 180 tablet 2  . cholecalciferol (VITAMIN D) 1000 UNITS tablet Take 1,000 Units by mouth daily.     . furosemide (LASIX) 40 MG tablet Take 1 tablet by mouth two  times daily 180 tablet 3  . KLOR-CON M20 20 MEQ tablet TAKE 1 TABLET BY MOUTH DAILY. 30 tablet 3  . lisinopril (PRINIVIL,ZESTRIL) 10 MG tablet Take 1 tablet by mouth  daily 90 tablet 3  . pantoprazole (PROTONIX) 40 MG tablet Take 1 tablet by mouth  every day at 12 noon 90 tablet 3  . pneumococcal 13-valent conjugate vaccine (PREVNAR 13) SUSP injection Inject 0.5 mLs into the muscle tomorrow at 10 am. 0.5 mL 0  . simvastatin (ZOCOR) 20 MG tablet Take 1 tablet by mouth at  bedtime 90 tablet 1  . vitamin B-12 (CYANOCOBALAMIN) 1000 MCG tablet Take 1,000 mcg by mouth every other day.    . zoster vaccine live, PF, (ZOSTAVAX) 86578 UNT/0.65ML injection Inject 19,400 Units into the skin once. 1 each 0  . warfarin (COUMADIN) 5 MG tablet Take 1 tablet (5 mg total) by mouth as directed. 150 tablet 1   No current facility-administered medications for this visit.    No Known Allergies  Review of Systems negative except from HPI and PMH  Physical Exam BP 120/76 mmHg  Pulse 60  Ht 6' (1.829 m)  Wt 205 lb 9.6 oz (93.26 kg)  BMI 27.88 kg/m2  SpO2 93% Well developed and well nourished in no acute distress HENT normal E scleral and icterus clear Neck Supple Clear to ausculation Device pocket well healed; without hematoma or erythema.  There is no tethering Regular rate and rhythm, no murmurs gallops or rub Soft with active bowel sounds No clubbing cyanosis  Edema Alert and oriented, grossly normal motor and sensory function Skin Warm and Dry    Assessment and  Plan Ischemic cardiomyopathy Without symptoms of ischemia\  Implantable defibrillator-[employer] The patient's  device was interrogated.  The information was reviewed. No changes were made in the programming.     Congestive heart-chronic-systolic Euvolemic continue current meds fatigue    He is not sure if is fatigue  is a manifestation of congestive heart failure. We will undertake an AV optimization echo his wife does not think he has sleep apnea.

## 2014-10-13 NOTE — Patient Instructions (Signed)
Medication Instructions:  No medication changes today  Labwork: Non e today  Testing/Procedures: None today  Follow-Up: Your physician recommends that you schedule a follow-up appointment in: 3-4 months with Dr Meda Coffee.

## 2014-10-13 NOTE — Assessment & Plan Note (Signed)
Patient continues on Coumadin for history of a mesenteric embolus in 2003. No change in therapy.

## 2014-10-13 NOTE — Patient Instructions (Signed)
Medication Instructions: - no changes  Labwork: - no changes  Procedures/Testing: - Your physician has recommended that you have an AV optimization echo Corporate investment banker). During this procedure, an echocardiogram is performed to optimize the timing of your device using ultrasound and a device programmer. Changes will be made to the device settings to help the heart chambers pump more efficiently. This procedure takes approximately one hour.  Follow-Up: - Remote monitoring is used to monitor your Pacemaker of ICD from home. This monitoring reduces the number of office visits required to check your device to one time per year. It allows [place] to keep an eye on the functioning of your device to ensure it is working properly. You are scheduled for a device check from home on 01/12/15. You may send your transmission at any time that day. If you have a wireless device, the transmission will be sent automatically. After your physician reviews your transmission, you will receive a postcard with your next transmission date.  - Your physician wants you to follow-up in: 1 year with Dr. Caryl Comes. You will receive a reminder letter in the mail two months in advance. If you don't receive a letter, please call our office to schedule the follow-up appointment.  Any Additional Special Instructions Will Be Listed Below (If Applicable).

## 2014-10-15 ENCOUNTER — Other Ambulatory Visit: Payer: Self-pay | Admitting: Cardiology

## 2014-10-16 LAB — CUP PACEART INCLINIC DEVICE CHECK
HighPow Impedance: 41 Ohm
HighPow Impedance: 57 Ohm
Lead Channel Impedance Value: 436 Ohm
Lead Channel Impedance Value: 462 Ohm
Lead Channel Pacing Threshold Amplitude: 0.7 V
Lead Channel Pacing Threshold Pulse Width: 0.8 ms
Lead Channel Sensing Intrinsic Amplitude: 4.8 mV
Lead Channel Setting Pacing Amplitude: 2 V
Lead Channel Setting Pacing Amplitude: 2.4 V
Lead Channel Setting Pacing Pulse Width: 0.4 ms
Lead Channel Setting Sensing Sensitivity: 0.6 mV
Lead Channel Setting Sensing Sensitivity: 1 mV
MDC IDC MSMT LEADCHNL RA PACING THRESHOLD AMPLITUDE: 1.2 V
MDC IDC MSMT LEADCHNL RA PACING THRESHOLD PULSEWIDTH: 0.4 ms
MDC IDC MSMT LEADCHNL RV IMPEDANCE VALUE: 567 Ohm
MDC IDC MSMT LEADCHNL RV PACING THRESHOLD AMPLITUDE: 0.8 V
MDC IDC MSMT LEADCHNL RV PACING THRESHOLD PULSEWIDTH: 0.4 ms
MDC IDC SESS DTM: 20160912040000
MDC IDC SET LEADCHNL LV PACING PULSEWIDTH: 0.8 ms
MDC IDC SET LEADCHNL RA PACING AMPLITUDE: 2 V
MDC IDC SET ZONE DETECTION INTERVAL: 286 ms
MDC IDC SET ZONE DETECTION INTERVAL: 333 ms
Pulse Gen Serial Number: 490477

## 2014-10-22 DIAGNOSIS — Z85828 Personal history of other malignant neoplasm of skin: Secondary | ICD-10-CM | POA: Diagnosis not present

## 2014-10-22 DIAGNOSIS — Z08 Encounter for follow-up examination after completed treatment for malignant neoplasm: Secondary | ICD-10-CM | POA: Diagnosis not present

## 2014-11-02 NOTE — Progress Notes (Signed)
    AV Optimization Echo Note Date: 11/02/2014  ID:  [name], DOB 1931/09/16, MRN [id number]  PCP: Penni Homans, MD Primary Cardiologist: [name] Electrophysiologist: Esther Hardy [name] is a 81 y.o. male is seen today for Dr Caryl Comes.  He presents today for AV optimization of CRT.    The patient is predominately atrially paced.  Device was programmed with a  PAV of 200 and SAV of 150 on presentation.  Paced AV delays were evaluated from 150 msec to 270 msec.  Optimal separation of E/A waves was noted at 230 msec.    Device was reprogrammed today to a SAV of 175mec and a PAV of 230 msec.    The patient will return to see me in clinic in 4 weeks for follow-up.   SFidel Levy NP  11/02/2014 6:13 PM     [place] [phone]) [phone])

## 2014-11-03 ENCOUNTER — Other Ambulatory Visit: Payer: Self-pay

## 2014-11-03 ENCOUNTER — Other Ambulatory Visit: Payer: Self-pay | Admitting: Internal Medicine

## 2014-11-03 ENCOUNTER — Ambulatory Visit (INDEPENDENT_AMBULATORY_CARE_PROVIDER_SITE_OTHER): Payer: Medicare Other | Admitting: Nurse Practitioner

## 2014-11-03 ENCOUNTER — Ambulatory Visit (INDEPENDENT_AMBULATORY_CARE_PROVIDER_SITE_OTHER): Payer: Medicare Other | Admitting: *Deleted

## 2014-11-03 ENCOUNTER — Ambulatory Visit (HOSPITAL_COMMUNITY): Payer: Medicare Other | Attending: Cardiology

## 2014-11-03 DIAGNOSIS — I10 Essential (primary) hypertension: Secondary | ICD-10-CM | POA: Insufficient documentation

## 2014-11-03 DIAGNOSIS — I5022 Chronic systolic (congestive) heart failure: Secondary | ICD-10-CM

## 2014-11-03 DIAGNOSIS — I517 Cardiomegaly: Secondary | ICD-10-CM | POA: Insufficient documentation

## 2014-11-03 DIAGNOSIS — Z87891 Personal history of nicotine dependence: Secondary | ICD-10-CM | POA: Insufficient documentation

## 2014-11-03 DIAGNOSIS — I255 Ischemic cardiomyopathy: Secondary | ICD-10-CM | POA: Insufficient documentation

## 2014-11-03 DIAGNOSIS — I447 Left bundle-branch block, unspecified: Secondary | ICD-10-CM | POA: Insufficient documentation

## 2014-11-03 DIAGNOSIS — Z5181 Encounter for therapeutic drug level monitoring: Secondary | ICD-10-CM

## 2014-11-03 DIAGNOSIS — I2510 Atherosclerotic heart disease of native coronary artery without angina pectoris: Secondary | ICD-10-CM | POA: Diagnosis not present

## 2014-11-03 DIAGNOSIS — K55069 Acute infarction of intestine, part and extent unspecified: Secondary | ICD-10-CM

## 2014-11-03 LAB — POCT INR: INR: 4.7

## 2014-11-10 LAB — CUP PACEART INCLINIC DEVICE CHECK
MDC IDC SESS DTM: 20161010075622
Pulse Gen Serial Number: 490477

## 2014-11-17 ENCOUNTER — Ambulatory Visit (INDEPENDENT_AMBULATORY_CARE_PROVIDER_SITE_OTHER): Payer: Medicare Other | Admitting: *Deleted

## 2014-11-17 DIAGNOSIS — Z5181 Encounter for therapeutic drug level monitoring: Secondary | ICD-10-CM

## 2014-11-17 DIAGNOSIS — K55069 Acute infarction of intestine, part and extent unspecified: Secondary | ICD-10-CM | POA: Diagnosis not present

## 2014-11-17 LAB — POCT INR: INR: 3.2

## 2014-11-25 ENCOUNTER — Telehealth: Payer: Self-pay | Admitting: Family Medicine

## 2014-11-25 DIAGNOSIS — N5203 Combined arterial insufficiency and corporo-venous occlusive erectile dysfunction: Secondary | ICD-10-CM | POA: Diagnosis not present

## 2014-11-25 DIAGNOSIS — N401 Enlarged prostate with lower urinary tract symptoms: Secondary | ICD-10-CM | POA: Diagnosis not present

## 2014-11-25 DIAGNOSIS — N3020 Other chronic cystitis without hematuria: Secondary | ICD-10-CM | POA: Diagnosis not present

## 2014-11-25 NOTE — Telephone Encounter (Signed)
Crystal from [place] called in requesting pt's last imaging records. Per CMA okay to fax over.   Sent fax to:  [employer] - [phone]

## 2014-12-01 NOTE — Progress Notes (Signed)
Cardiology Office Note Date:  12/02/2014  Patient ID:  [name], [name] April 28, 1931, MRN [id number] PCP:  Penni Homans, MD  Cardiologist:  Dr. [name], pending 1st visit with Dr. Meda Coffee in January Electrophysiologist: Dr. Caryl Comes   Chief Complaint: [place] [employer] persistant sinus congestion today.  Continues with some vague feelings of fatigue, though he relates this to his ongoing sinus issues.    History of Present Illness: [name] is a 81 y.o. male with history of CAD, ICM/ICD, HTN, hyperlipidemia, PAF.  He denies any kind of CP, he reports doing his routine daily activities without difficulty, no palpitations, no shocks from his devic e, no near syncope or syncope.  He underwent echo for optimization recently though does not feel like there has been any real improvement in his feelings of lack of energy.  Not any worse.  He feels he sleeps OK but his sinus congestion bothers him and thinks this impacts his ability to sleep well, he denies orthopnea or PND symptoms. He denies any issues with his coumadin, no bleeding or signs of bleeding, monitoring about q2-3weeks with the coumadin clinic.   Past Medical History  Diagnosis Date  . Dizziness     Positional dizziness  . Hypertension   . Ischemic cardiomyopathy     CABG 1994 CAD catherization 1996.[name] occluded vein graft to the diagnol, other grafts patent/  catherization 2006, no PCI/ nuclear.[name] october 2011.[name] extensive scar anteroseptal and apical.. no ischemia    . CHF (congestive heart failure) (HCC)     chronic systolic  . Mitral regurgitation     mild.[name] echo november 2011  EF 35%...echo.. november 2009/ ef 35%.[name] echo november 2011  . Sinus bradycardia   . Chronotropic incompetence   . Biventricular implantable cardiac defibrillator in situ     GDT CONTAK H170-MADIT-CRT-EXPLANTED 2011 implanted defibrillator- Guidant cognis, model n119-2001 Dr. Caryl Comes (11/28/2009)  . LBBB (left bundle branch block)   . Hyperlipemia   . GERD  (gastroesophageal reflux disease)   . Colonic polyp 2010    pathology not clear.   . Crohn's disease ([place])   . Acute vascular insufficiency of intestine (HCC)     Mesenteric embolus...surgery 2003  . Ventral hernia     multiple, wears an abd binder, reports he hs been told he was at increased riisk for surgery  . Hypertrophy of prostate with urinary obstruction and other lower urinary tract symptoms (LUTS)   . Degenerative joint disease   . Polymyalgia rheumatica ([place])   . Tremor   . Anxiety   . Shingles   . Vitamin B12 deficiency   . CAD (coronary artery disease)     CABG 1994  /   catheterization 1996, occluded vein graft to the diagonal, other grafts patent  /   catheterization 2006, no PCI  /  nuclear, October, 2011, extensive scar anteroseptal and apical, no ischemia  . Hx of CABG     1994  . Warfarin anticoagulation     Coumadin therapy for mesenteric embolus 2003  . Carotid artery disease ([place])     Doppler, February,  2012, 0-39% bilateral, recommended followup 1 year  . Edema leg     Venous Dopplers 8/13: No DVT bilaterally  . Breast tenderness     Spironolactone was stopped and eplerinone started. Patient feels better  . Thrombocytopenia ([place]) 2008  . Edema 09/02/2013  . Acute bronchitis 09/02/2013  . Iron deficiency anemia   . Thrush   . Colon polyp   .  Atrial fibrillation (HCC)     paroxysmal    Past Surgical History  Procedure Laterality Date  . Coronary artery bypass graft  1994  . Laparoscopic cholecystectomy  2002  . Elap w/ superior mesenteric artery embolectomy  1/03    by Dr. Jennette Banker  . Repair of incarcerated ventral hernia and lysis of adhesions  11/03    by Dr. Betsy Pries  . Implantation of biventric cardioverter-defibrillatorr      by Dr. Lovena Le in 8/06 [employer] [place] 2011  . Implantation of guidant cognis device      model [id number]  . Left inguinal hernia repair with mesh  6/08    by Dr. Betsy Pries  . Prostate surgery  08/2012    by  urology in [place]  . Esophagogastroduodenoscopy N/A 04/18/2013    Procedure: ESOPHAGOGASTRODUODENOSCOPY (EGD);  Surgeon: Jerene Bears, MD;  Location: [facility] ENDOSCOPY;  Service: Endoscopy;  Laterality: N/A;  . Colonoscopy N/A 04/19/2013    Procedure: COLONOSCOPY;  Surgeon: Jerene Bears, MD;  Location: [facility] ENDOSCOPY;  Service: Endoscopy;  Laterality: N/A;    Current Outpatient Prescriptions  Medication Sig Dispense Refill  . carvedilol (COREG) 6.25 MG tablet Take 1 tablet by mouth  twice a day with meals 180 tablet 2  . cholecalciferol (VITAMIN D) 1000 UNITS tablet Take 1,000 Units by mouth daily.     . furosemide (LASIX) 40 MG tablet Take 1 tablet by mouth two  times daily 180 tablet 3  . KLOR-CON M20 20 MEQ tablet TAKE 1 TABLET BY MOUTH DAILY. 30 tablet 3  . lisinopril (PRINIVIL,ZESTRIL) 10 MG tablet Take 1 tablet by mouth  daily 90 tablet 3  . pantoprazole (PROTONIX) 40 MG tablet Take 1 tablet by mouth  every day at 12 noon 90 tablet 3  . pneumococcal 13-valent conjugate vaccine (PREVNAR 13) SUSP injection Inject 0.5 mLs into the muscle tomorrow at 10 am. 0.5 mL 0  . simvastatin (ZOCOR) 20 MG tablet Take 1 tablet by mouth at  bedtime 90 tablet 1  . vitamin B-12 (CYANOCOBALAMIN) 1000 MCG tablet Take 1,000 mcg by mouth every other day.    . warfarin (COUMADIN) 5 MG tablet Take 1 tablet (5 mg total) by mouth as directed. 150 tablet 1  . zoster vaccine live, PF, (ZOSTAVAX) 82956 UNT/0.65ML injection Inject 19,400 Units into the skin once. 1 each 0   No current facility-administered medications for this visit.    Allergies:   Review of patient's allergies indicates no known allergies.   Social History:  The patient  reports that he quit smoking about 41 years ago. His smoking use included Cigarettes. He has a 20 pack-year smoking history. He has never used smokeless tobacco. He reports that he drinks alcohol. He reports that he does not use illicit drugs.   Family History:  The patient's family  history includes Appendicitis in his brother; Cirrhosis in his brother; Dementia in his sister; Diabetes in his son; Edema in his sister; Heart disease in his mother, sister, sister, and sister; Heart disease (age of onset: 43) in his father; Heart failure in his sister; Hyperlipidemia in an other family member; Hypertension in his brother, father, sister, and another family member; Kidney disease in his brother; Leukemia in his brother; Myasthenia gravis in his brother; Tremor in his sister.  ROS:  Please see the history of present illness.  All other systems are reviewed and otherwise negative.   PHYSICAL EXAM:  VS:  BP 120/68 mmHg  Pulse 60  Ht  6' (1.829 m)  Wt 205 lb (92.987 kg)  BMI 27.80 kg/m2 BMI: Body mass index is 27.8 kg/(m^2). Well nourished, well developed, in no acute distress HEENT: normocephalic, atraumatic Neck: no JVD, carotid bruits or masses Cardiac:  normal S1, S2; RRR; no significant murmurs, no rubs, or gallops Lungs:  clear to auscultation bilaterally, no wheezing, rhonchi or rales Abd: abdominal binder in place MS: no deformity or atrophy Ext: no edema Skin: warm and dry, no rash Neuro:  No gross deficits appreciated Psych: euthymic mood, full affect  ICD site is stable, no tethering or discomfort   EKG:  Done today shows AV pacing  Recent Labs: 12/02/14: INR 2.9 (managed with the coumadin clinic)  09/04/2014: ALT 18; BUN 9; Creatinine, Ser 0.86; Hemoglobin 15.3; Platelets 69.0*; Potassium 4.1; Sodium 139; TSH 2.06  12/30/2013: Direct LDL 81.6 09/04/2014: Cholesterol 140; HDL 48.70; LDL Cholesterol 62; Total CHOL/HDL Ratio 3; Triglycerides 145.0; VLDL 29.0     Wt Readings from Last 3 Encounters:  12/02/14 205 lb (92.987 kg)  10/13/14 205 lb 9.6 oz (93.26 kg)  10/13/14 204 lb 12.8 oz (92.897 kg)     Other studies reviewed: 11/03/14: Echo for optimizatioin Study Conclusions - Left ventricle: Hypokinesis of the basal and mid inferolateral walls. The  cavity size was mildly dilated. There was moderate concentric hypertrophy. Systolic function was moderately reduced. The estimated ejection fraction was in the range of 35% to 40%. Wall motion was normal; there were no regional wall motion abnormalities. Doppler parameters are consistent with abnormal left ventricular relaxation (grade 1 diastolic dysfunction). - Aortic root: The aortic root was normal in size. - Left atrium: The atrium was moderately dilated. - Right ventricle: Systolic function was normal. - Right atrium: Pacer wire or catheter noted in right atrium. - Inferior vena cava: The vessel was not visualized. - Pericardium, extracardiac: There was no pericardial effusion. Impressions: - A limited study without use of Doppler. LVEF 35-40%.  09/23/11 Echocardiogram Study Conclusions - Left ventricle: The cavity size was normal. Wall thickness was increased in a pattern of mild LVH. Systolic function was moderately reduced. The estimated ejection fraction was in the range of 35% to 40%. Hypokinesis of the entire myocardium. Doppler parameters are consistent with abnormal left ventricular relaxation (grade 1 diastolic dysfunction). - Aortic valve: Trivial regurgitation. - Mitral valve: Mild regurgitation. - Left atrium: The atrium was mildly dilated. - Atrial septum: No defect or patent foramen ovale was identified.  Device: [employer] CRT ICD implanted 2011  ASSESSMENT AND PLAN:  1. ICM, chronic systolic CHF       euvolemic by exam, weight is stable       Chemical engineer CRT/ICD remote check Oct 17, reviewed with Dr. Curt Bears, functioning properly, 97% V pacing       On BB/ACE, diuretic       Labs look OK       [place] q31mo will start ICM clinic calls with LSharman Cheek 2. HTN     Appears well controlled  3. CAD     No angina, pending visit with Dr. NMeda Coffeein January     On BB, statin  4. PAFib, remote history of mesenteric  thrombus     Discussed with the patient PAF     Reviewed with Dr. KCaryl Comes noted on prior ICD checks as well, infrequent episodes     CHADs2Vasc is at least 5, on Warfarin     Counseled on medication  Disposition: F/u with Dr. NMeda Coffeeas  planned in January, labs then pending her visit, 15mowith EP APP.  Sooner if needed.  He is instructed to f/u with his PMD regarding his sinus congestion.  Current medicines are reviewed at length with the patient today.  The patient did not have any concerns regarding medicines.  SHaywood Lasso PA-C 12/02/2014 10:59 AM     [facility] [place] [phone](office)  [phone])

## 2014-12-02 ENCOUNTER — Encounter: Payer: Self-pay | Admitting: Physician Assistant

## 2014-12-02 ENCOUNTER — Ambulatory Visit (INDEPENDENT_AMBULATORY_CARE_PROVIDER_SITE_OTHER): Payer: Medicare Other

## 2014-12-02 ENCOUNTER — Ambulatory Visit (INDEPENDENT_AMBULATORY_CARE_PROVIDER_SITE_OTHER): Payer: Medicare Other | Admitting: Physician Assistant

## 2014-12-02 VITALS — BP 120/68 | HR 60 | Ht 72.0 in | Wt 205.0 lb

## 2014-12-02 DIAGNOSIS — I255 Ischemic cardiomyopathy: Secondary | ICD-10-CM

## 2014-12-02 DIAGNOSIS — I25810 Atherosclerosis of coronary artery bypass graft(s) without angina pectoris: Secondary | ICD-10-CM | POA: Diagnosis not present

## 2014-12-02 DIAGNOSIS — I480 Paroxysmal atrial fibrillation: Secondary | ICD-10-CM | POA: Diagnosis not present

## 2014-12-02 DIAGNOSIS — K55069 Acute infarction of intestine, part and extent unspecified: Secondary | ICD-10-CM

## 2014-12-02 DIAGNOSIS — I5022 Chronic systolic (congestive) heart failure: Secondary | ICD-10-CM

## 2014-12-02 DIAGNOSIS — I10 Essential (primary) hypertension: Secondary | ICD-10-CM

## 2014-12-02 LAB — POCT INR: INR: 2.9

## 2014-12-02 NOTE — Addendum Note (Signed)
Addended by: Leanord Asal T on: 12/02/2014 01:14 PM   Modules accepted: Orders

## 2014-12-02 NOTE — Patient Instructions (Addendum)
Medication Instructions:  Your physician recommends that you continue on your current medications as directed. Please refer to the Current Medication list given to you today.     * If you need a refill on your cardiac medications before your next appointment, please call your pharmacy.*  Labwork: NONE ORDER TODAY    Testing/Procedures: NONE ORDER TODAY    Follow-Up: Your physician wants you to follow-up in:  IN  6  MONTHS WITH DR  Tommye Standard PA   You will receive a reminder letter in the mail two months in advance. If you don't receive a letter, please call our office to schedule the follow-up appointment.  FOLLOW  ICM WITH LAURIE SHORT ONLY   Remote monitoring is used to monitor your Pacemaker of ICD from home. This monitoring reduces the number of office visits required to check your device to one time per year. It allows [place] to keep an eye on the functioning of your device to ensure it is working properly. You are scheduled for a device check from home on .  1  / 26   /17  You may send your transmission at any time that day. If you have a wireless device, the transmission will be sent automatically. After your physician reviews your transmission, you will receive a postcard with your next transmission date.     Any Other Special Instructions Will Be Listed Below (If Applicable).

## 2014-12-02 NOTE — Addendum Note (Signed)
Addended by: Leanord Asal T on: 12/02/2014 01:10 PM   Modules accepted: Orders

## 2014-12-02 NOTE — Progress Notes (Signed)
Patient referred to [facility] clinic by Tommye Standard, PA-C.  ICM introduction given during office visit and explained ICM program.  He agreed to monthly calls.  Explained I would send letter that will have the transmission date and provided contact information.  Encouraged him to call for any questions.

## 2014-12-03 ENCOUNTER — Ambulatory Visit: Payer: Medicare Other | Admitting: Nurse Practitioner

## 2014-12-09 ENCOUNTER — Ambulatory Visit (INDEPENDENT_AMBULATORY_CARE_PROVIDER_SITE_OTHER): Payer: Medicare Other

## 2014-12-09 VITALS — BP 116/64 | HR 65 | Ht 71.0 in | Wt 207.0 lb

## 2014-12-09 DIAGNOSIS — Z0000 Encounter for general adult medical examination without abnormal findings: Secondary | ICD-10-CM | POA: Diagnosis not present

## 2014-12-09 DIAGNOSIS — Z23 Encounter for immunization: Secondary | ICD-10-CM

## 2014-12-09 NOTE — Progress Notes (Signed)
Pre visit review using our clinic review tool, if applicable. No additional management support is needed unless otherwise documented below in the visit note. 

## 2014-12-09 NOTE — Patient Instructions (Addendum)
Follow up with Dr. Charlett Blake as scheduled.  Increase physical activity as tolerated.   Eat heart healthy diet.  Increase protein intake (Ensure may help in protein intake).    Continue doing brain stimulating activities (puzzles, reading, adult coloring books, staying active) to keep memory sharp.    Fat and Cholesterol Restricted Diet High levels of fat and cholesterol in your blood may lead to various health problems, such as diseases of the heart, blood vessels, gallbladder, liver, and pancreas. Fats are concentrated sources of energy that come in various forms. Certain types of fat, including saturated fat, may be harmful in excess. Cholesterol is a substance needed by your body in small amounts. Your body makes all the cholesterol it needs. Excess cholesterol comes from the food you eat. When you have high levels of cholesterol and saturated fat in your blood, health problems can develop because the excess fat and cholesterol will gather along the walls of your blood vessels, causing them to narrow. Choosing the right foods will help you control your intake of fat and cholesterol. This will help keep the levels of these substances in your blood within normal limits and reduce your risk of disease. WHAT IS MY PLAN? Your health care provider recommends that you:  Get no more than __________ % of the total calories in your daily diet from fat.  Limit your intake of saturated fat to less than ______% of your total calories each day.  Limit the amount of cholesterol in your diet to less than _________mg per day. WHAT TYPES OF FAT SHOULD I CHOOSE?  Choose healthy fats more often. Choose monounsaturated and polyunsaturated fats, such as olive and canola oil, flaxseeds, walnuts, almonds, and seeds.  Eat more omega-3 fats. Good choices include salmon, mackerel, sardines, tuna, flaxseed oil, and ground flaxseeds. Aim to eat fish at least two times a week.  Limit saturated fats. Saturated fats are  primarily found in animal products, such as meats, butter, and cream. Plant sources of saturated fats include palm oil, palm kernel oil, and coconut oil.  Avoid foods with partially hydrogenated oils in them. These contain trans fats. Examples of foods that contain trans fats are stick margarine, some tub margarines, cookies, crackers, and other baked goods. WHAT GENERAL GUIDELINES DO I NEED TO FOLLOW? These guidelines for healthy eating will help you control your intake of fat and cholesterol:  Check food labels carefully to identify foods with trans fats or high amounts of saturated fat.  Fill one half of your plate with vegetables and green salads.  Fill one fourth of your plate with whole grains. Look for the word "whole" as the first word in the ingredient list.  Fill one fourth of your plate with lean protein foods.  Limit fruit to two servings a day. Choose fruit instead of juice.  Eat more foods that contain soluble fiber. Examples of foods that contain this type of fiber are apples, broccoli, carrots, beans, peas, and barley. Aim to get 20-30 g of fiber per day.  Eat more home-cooked food and less restaurant, buffet, and fast food.  Limit or avoid alcohol.  Limit foods high in starch and sugar.  Limit fried foods.  Cook foods using methods other than frying. Baking, boiling, grilling, and broiling are all great options.  Lose weight if you are overweight. Losing just 5-10% of your initial body weight can help your overall health and prevent diseases such as diabetes and heart disease. WHAT FOODS CAN I EAT? Grains Whole  grains, such as whole wheat or whole grain breads, crackers, cereals, and pasta. Unsweetened oatmeal, bulgur, barley, quinoa, or brown rice. Corn or whole wheat flour tortillas. Vegetables Fresh or frozen vegetables (raw, steamed, roasted, or grilled). Green salads. Fruits All fresh, canned (in natural juice), or frozen fruits. Meat and Other Protein  Products Ground beef (85% or leaner), grass-fed beef, or beef trimmed of fat. Skinless chicken or [place]. Ground chicken or [place]. Pork trimmed of fat. All fish and seafood. Eggs. Dried beans, peas, or lentils. Unsalted nuts or seeds. Unsalted canned or dry beans. Dairy Low-fat dairy products, such as skim or 1% milk, 2% or reduced-fat cheeses, low-fat ricotta or cottage cheese, or plain low-fat yogurt. Fats and Oils Tub margarines without trans fats. Light or reduced-fat mayonnaise and salad dressings. Avocado. Olive, canola, sesame, or safflower oils. Natural peanut or almond butter (choose ones without added sugar and oil). The items listed above may not be a complete list of recommended foods or beverages. Contact your dietitian for more options. WHAT FOODS ARE NOT RECOMMENDED? Grains White bread. White pasta. White rice. Cornbread. Bagels, pastries, and croissants. Crackers that contain trans fat. Vegetables White potatoes. Corn. Creamed or fried vegetables. Vegetables in a cheese sauce. Fruits Dried fruits. Canned fruit in light or heavy syrup. Fruit juice. Meat and Other Protein Products Fatty cuts of meat. Ribs, chicken wings, bacon, sausage, bologna, salami, chitterlings, fatback, hot dogs, bratwurst, and packaged luncheon meats. Liver and organ meats. Dairy Whole or 2% milk, cream, half-and-half, and cream cheese. Whole milk cheeses. Whole-fat or sweetened yogurt. Full-fat cheeses. Nondairy creamers and whipped toppings. Processed cheese, cheese spreads, or cheese curds. Sweets and Desserts Corn syrup, sugars, honey, and molasses. Candy. Jam and jelly. Syrup. Sweetened cereals. Cookies, pies, cakes, donuts, muffins, and ice cream. Fats and Oils Butter, stick margarine, lard, shortening, ghee, or bacon fat. Coconut, palm kernel, or palm oils. Beverages Alcohol. Sweetened drinks (such as sodas, lemonade, and fruit drinks or punches). The items listed above may not be a complete list  of foods and beverages to avoid. Contact your dietitian for more information.   This information is not intended to replace advice given to you by your health care provider. Make sure you discuss any questions you have with your health care provider.   Document Released: 01/17/2005 Document Revised: 02/07/2014 Document Reviewed: 04/17/2013 Elsevier Interactive Patient Education 2016 [place], Male A healthy lifestyle and preventative care can promote health and wellness.  Maintain regular health, dental, and eye exams.  Eat a healthy diet. Foods like vegetables, fruits, whole grains, low-fat dairy products, and lean protein foods contain the nutrients you need and are low in calories. Decrease your intake of foods high in solid fats, added sugars, and salt. Get information about a proper diet from your health care provider, if necessary.  Regular physical exercise is one of the most important things you can do for your health. Most adults should get at least 150 minutes of moderate-intensity exercise (any activity that increases your heart rate and causes you to sweat) each week. In addition, most adults need muscle-strengthening exercises on 2 or more days a week.   Maintain a healthy weight. The body mass index (BMI) is a screening tool to identify possible weight problems. It provides an estimate of body fat based on height and weight. Your health care provider can find your BMI and can help you achieve or maintain a healthy weight. For males 20 years and older:  A BMI below 18.5 is considered underweight.  A BMI of 18.5 to 24.9 is normal.  A BMI of 25 to 29.9 is considered overweight.  A BMI of 30 and above is considered obese.  Maintain normal blood lipids and cholesterol by exercising and minimizing your intake of saturated fat. Eat a balanced diet with plenty of fruits and vegetables. Blood tests for lipids and cholesterol should begin at age 64 and be  repeated every 5 years. If your lipid or cholesterol levels are high, you are over age 54, or you are at high risk for heart disease, you may need your cholesterol levels checked more frequently.Ongoing high lipid and cholesterol levels should be treated with medicines if diet and exercise are not working.  If you smoke, find out from your health care provider how to quit. If you do not use tobacco, do not start.  Lung cancer screening is recommended for adults aged 57-80 years who are at high risk for developing lung cancer because of a history of smoking. A yearly low-dose CT scan of the lungs is recommended for people who have at least a 30-pack-year history of smoking and are current smokers or have quit within the past 15 years. A pack year of smoking is smoking an average of 1 pack of cigarettes a day for 1 year (for example, a 30-pack-year history of smoking could mean smoking 1 pack a day for 30 years or 2 packs a day for 15 years). Yearly screening should continue until the smoker has stopped smoking for at least 15 years. Yearly screening should be stopped for people who develop a health problem that would prevent them from having lung cancer treatment.  If you choose to drink alcohol, do not have more than 2 drinks per day. One drink is considered to be 12 oz (360 mL) of beer, 5 oz (150 mL) of wine, or 1.5 oz (45 mL) of liquor.  Avoid the use of street drugs. Do not share needles with anyone. Ask for help if you need support or instructions about stopping the use of drugs.  High blood pressure causes heart disease and increases the risk of stroke. High blood pressure is more likely to develop in:  People who have blood pressure in the end of the normal range (100-139/85-89 mm Hg).  People who are overweight or obese.  People who are African American.  If you are 43-39 years of age, have your blood pressure checked every 3-5 years. If you are 30 years of age or older, have your blood  pressure checked every year. You should have your blood pressure measured twice--once when you are at a hospital or clinic, and once when you are not at a hospital or clinic. Record the average of the two measurements. To check your blood pressure when you are not at a hospital or clinic, you can use:  An automated blood pressure machine at a pharmacy.  A home blood pressure monitor.  If you are 49-26 years old, ask your health care provider if you should take aspirin to prevent heart disease.  Diabetes screening involves taking a blood sample to check your fasting blood sugar level. This should be done once every 3 years after age 14 if you are at a normal weight and without risk factors for diabetes. Testing should be considered at a younger age or be carried out more frequently if you are overweight and have at least 1 risk factor for diabetes.  Colorectal cancer can  be detected and often prevented. Most routine colorectal cancer screening begins at the age of 51 and continues through age 13. However, your health care provider may recommend screening at an earlier age if you have risk factors for colon cancer. On a yearly basis, your health care provider may provide home test kits to check for hidden blood in the stool. A small camera at the end of a tube may be used to directly examine the colon (sigmoidoscopy or colonoscopy) to detect the earliest forms of colorectal cancer. Talk to your health care provider about this at age 55 when routine screening begins. A direct exam of the colon should be repeated every 5-10 years through age 25, unless early forms of precancerous polyps or small growths are found.  People who are at an increased risk for hepatitis B should be screened for this virus. You are considered at high risk for hepatitis B if:  You were born in a country where hepatitis B occurs often. Talk with your health care provider about which countries are considered high risk.  Your  parents were born in a high-risk country and you have not received a shot to protect against hepatitis B (hepatitis B vaccine).  You have HIV or AIDS.  You use needles to inject street drugs.  You live with, or have sex with, someone who has hepatitis B.  You are a man who has sex with other men (MSM).  You get hemodialysis treatment.  You take certain medicines for conditions like cancer, organ transplantation, and autoimmune conditions.  Hepatitis C blood testing is recommended for all people born from [birth year] through 1963 and any individual with known risk factors for hepatitis C.  Healthy men should no longer receive prostate-specific antigen (PSA) blood tests as part of routine cancer screening. Talk to your health care provider about prostate cancer screening.  Testicular cancer screening is not recommended for adolescents or adult males who have no symptoms. Screening includes self-exam, a health care provider exam, and other screening tests. Consult with your health care provider about any symptoms you have or any concerns you have about testicular cancer.  Practice safe sex. Use condoms and avoid high-risk sexual practices to reduce the spread of sexually transmitted infections (STIs).  You should be screened for STIs, including gonorrhea and chlamydia if:  You are sexually active and are younger than 24 years.  You are older than 24 years, and your health care provider tells you that you are at risk for this type of infection.  Your sexual activity has changed since you were last screened, and you are at an increased risk for chlamydia or gonorrhea. Ask your health care provider if you are at risk.  If you are at risk of being infected with HIV, it is recommended that you take a prescription medicine daily to prevent HIV infection. This is called pre-exposure prophylaxis (PrEP). You are considered at risk if:  You are a man who has sex with other men (MSM).  You are a  heterosexual man who is sexually active with multiple partners.  You take drugs by injection.  You are sexually active with a partner who has HIV.  Talk with your health care provider about whether you are at high risk of being infected with HIV. If you choose to begin PrEP, you should first be tested for HIV. You should then be tested every 3 months for as long as you are taking PrEP.  Use sunscreen. Apply sunscreen liberally and  repeatedly throughout the day. You should seek shade when your shadow is shorter than you. Protect yourself by wearing long sleeves, pants, a wide-brimmed hat, and sunglasses year round whenever you are outdoors.  Tell your health care provider of new moles or changes in moles, especially if there is a change in shape or color. Also, tell your health care provider if a mole is larger than the size of a pencil eraser.  A one-time screening for abdominal aortic aneurysm (AAA) and surgical repair of large AAAs by ultrasound is recommended for men aged 14-75 years who are current or former smokers.  Stay current with your vaccines (immunizations).   This information is not intended to replace advice given to you by your health care provider. Make sure you discuss any questions you have with your health care provider.   Document Released: 07/16/2007 Document Revised: 02/07/2014 Document Reviewed: 06/14/2010 Elsevier Interactive Patient Education [employer].

## 2014-12-09 NOTE — Progress Notes (Signed)
Subjective:   DECARI DUGGAR is a 81 y.o. male who presents for an Initial Medicare Annual Wellness Visit.  Review of Systems: No ROS  Cardiac Risk Factors include: advanced age (>58mn, >>54women);male gender;family history of premature cardiovascular disease;hypertension;dyslipidemia   Sleep patterns:  Sleeps 7-8 hours at night/typically doesn't have to wake up at night to void.  Home Safety/Smoke Alarms: Feels safe at home.  Lives with wife.  Smoke alarms present. Firearm Safety: Kept in safe place.  Seat Belt Safety/Bike Helmet:  Does not wear seat belt.  Uncomfortable due to defib and previous cardiac surgery.  Was told by police officer that patient needs a note from doctor stating that he's unable to wear seat belt.     Counseling:   Eye Exam- 03/2014 Dental- [place].  Goes every year.   Male:  CCS-04/19/13    PSA-09/17/12- 3.6 ng/ml   Immunization:  Flu and PNA 13 given today.  Shingles: Due; postponed  Objective:    Today's Vitals   12/09/14 1121  BP: 116/64  Pulse: 65  Height: 5' 11"  (1.803 m)  Weight: 207 lb (93.895 kg)  SpO2: 95%  PainSc: 0-No pain    Current Medications (verified) Outpatient Encounter Prescriptions as of 12/09/2014  Medication Sig  . carvedilol (COREG) 6.25 MG tablet Take 1 tablet by mouth  twice a day with meals  . cholecalciferol (VITAMIN D) 1000 UNITS tablet Take 1,000 Units by mouth daily.   . furosemide (LASIX) 40 MG tablet Take 1 tablet by mouth two  times daily  . KLOR-CON M20 20 MEQ tablet TAKE 1 TABLET BY MOUTH DAILY.  .Marland Kitchenlisinopril (PRINIVIL,ZESTRIL) 10 MG tablet Take 1 tablet by mouth  daily  . pantoprazole (PROTONIX) 40 MG tablet Take 1 tablet by mouth  every day at 12 noon  . simvastatin (ZOCOR) 20 MG tablet Take 1 tablet by mouth at  bedtime  . vitamin B-12 (CYANOCOBALAMIN) 1000 MCG tablet Take 1,000 mcg by mouth every other day.  . warfarin (COUMADIN) 5 MG tablet Take 1 tablet (5 mg total) by mouth as directed.  .  pneumococcal 13-valent conjugate vaccine (PREVNAR 13) SUSP injection Inject 0.5 mLs into the muscle tomorrow at 10 am. (Patient not taking: Reported on 12/09/2014)  . zoster vaccine live, PF, (ZOSTAVAX) 160109UNT/0.65ML injection Inject 19,400 Units into the skin once. (Patient not taking: Reported on 12/09/2014)   No facility-administered encounter medications on file as of 12/09/2014.    Allergies (verified) Review of patient's allergies indicates no known allergies.   History: Past Medical History  Diagnosis Date  . Dizziness     Positional dizziness  . Hypertension   . Ischemic cardiomyopathy     CABG 1994 CAD catherization 1996..Marland Kitchenoccluded vein graft to the diagnol, other grafts patent/  catherization 2006, no PCI/ nuclear..Marland Kitchenoctober 2011..Marland Kitchenextensive scar anteroseptal and apical.. no ischemia    . CHF (congestive heart failure) (HCC)     chronic systolic  . Mitral regurgitation     mild..Marland Kitchenecho november 2011  EF 35%...echo.. november 2009/ ef 35%..Marland Kitchenecho november 2011  . Sinus bradycardia   . Chronotropic incompetence   . Biventricular implantable cardiac defibrillator in situ     GDT CONTAK H170-MADIT-CRT-EXPLANTED 2011 implanted defibrillator- [employer]ant cognis, model n119-2001 Dr. KCaryl Comes(11/28/2009)  . LBBB (left bundle branch block)   . Hyperlipemia   . GERD (gastroesophageal reflux disease)   . Colonic polyp 2010    pathology not clear.   . Crohn's disease (  [place])   . Acute vascular insufficiency of intestine (HCC)     Mesenteric embolus...surgery 2003  . Ventral hernia     multiple, wears an abd binder, reports he hs been told he was at increased riisk for surgery  . Hypertrophy of prostate with urinary obstruction and other lower urinary tract symptoms (LUTS)   . Degenerative joint disease   . Polymyalgia rheumatica ([place])   . Tremor   . Anxiety   . Shingles   . Vitamin B12 deficiency   . CAD (coronary artery disease)     CABG 1994  /   catheterization 1996, occluded vein  graft to the diagonal, other grafts patent  /   catheterization 2006, no PCI  /  nuclear, October, 2011, extensive scar anteroseptal and apical, no ischemia  . Hx of CABG     1994  . Warfarin anticoagulation     Coumadin therapy for mesenteric embolus 2003  . Carotid artery disease ([place])     Doppler, February,  2012, 0-39% bilateral, recommended followup 1 year  . Edema leg     Venous Dopplers 8/13: No DVT bilaterally  . Breast tenderness     Spironolactone was stopped and eplerinone started. Patient feels better  . Thrombocytopenia ([place]) 2008  . Edema 09/02/2013  . Acute bronchitis 09/02/2013  . Iron deficiency anemia   . Thrush   . Colon polyp   . Atrial fibrillation (HCC)     paroxysmal   Past Surgical History  Procedure Laterality Date  . Coronary artery bypass graft  1994  . Laparoscopic cholecystectomy  2002  . Elap w/ superior mesenteric artery embolectomy  1/03    by Dr. Jennette Banker  . Repair of incarcerated ventral hernia and lysis of adhesions  11/03    by Dr. Betsy Pries  . Implantation of biventric cardioverter-defibrillatorr      by Dr. Lovena Le in 8/06 [employer] 2011  . Implantation of guidant cognis device      model [id number]  . Left inguinal hernia repair with mesh  6/08    by Dr. Betsy Pries  . Prostate surgery  08/2012    by urology in [place]  . Esophagogastroduodenoscopy N/A 04/18/2013    Procedure: ESOPHAGOGASTRODUODENOSCOPY (EGD);  Surgeon: Jerene Bears, MD;  Location: [facility] ENDOSCOPY;  Service: Endoscopy;  Laterality: N/A;  . Colonoscopy N/A 04/19/2013    Procedure: COLONOSCOPY;  Surgeon: Jerene Bears, MD;  Location: [facility] ENDOSCOPY;  Service: Endoscopy;  Laterality: N/A;  . Cataract extraction, bilateral  2014   Family History  Problem Relation Age of Onset  . Heart disease Mother   . Hypertension    . Hyperlipidemia    . Tremor Sister   . Heart failure Sister   . Heart disease Father 80  . Hypertension Father   . Appendicitis Brother      ruptured  . Diabetes Son   . Heart disease Sister   . Hypertension Sister   . Dementia Sister   . Heart disease Sister   . Hypertension Brother   . Kidney disease Brother   . Myasthenia gravis Brother   . Cirrhosis Brother   . Leukemia Brother   . Heart disease Sister   . Edema Sister     pedal edema   Social History   Occupational History  . 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 Smoking status: Former Smoker -- 1.00 packs/day for 20 years  Types: Cigarettes    Quit date: 01/31/1973  . Smokeless tobacco: Never Used  . Alcohol Use: 0.0 oz/week    0 Standard drinks or equivalent per week     Comment: occasional  . Drug Use: No  . Sexual Activity: No     Comment: lives with wife, no dietary restrictions   Tobacco Counseling Counseling given: Not Answered   Activities of Daily Living In your present state of health, do you have any difficulty performing the following activities: 12/09/2014  Hearing? Y  Vision? N  Difficulty concentrating or making decisions? Y  Walking or climbing stairs? N  Dressing or bathing? N  Doing errands, shopping? N  Preparing Food and eating ? N  Using the Toilet? Y  In the past six months, have you accidently leaked urine? N  Do you have problems with loss of bowel control? N  Managing your Medications? Y  Managing your Finances? Y  Housekeeping or managing your Housekeeping? N    Immunizations and Health Maintenance Immunization History  Administered Date(s) Administered  . Influenza Split 11/24/2010, 11/30/2011, 10/31/2012  . Influenza Whole 11/06/2008  . Influenza,inj,Quad PF,36+ Mos 12/30/2013, 12/09/2014  . Pneumococcal Conjugate-13 12/09/2014  . Pneumococcal Polysaccharide-23 03/11/2013  . Tdap 03/11/2013   There are no preventive care reminders to display for this patient.  Patient Care Team: [name], MD as PCP - General (Family Medicine) Carlena Bjornstad, MD as Consulting  Physician (Cardiology) Jerene Bears, MD as Consulting Physician (Gastroenterology) Darleen Crocker, MD as Consulting Physician (Ophthalmology)  Dr. Sabino Niemann    Indicate any recent Medical Services you may have received from other than [facility] providers in the past year (date may be approximate).    Assessment:   This is a routine wellness examination for [place].  Chronic systolic heart failure- Stable on medication.  Last EF: 35-40%.  Denies chest pain, fatigue, shortness of breath, edema, and weight gain.  Dr. [name] has retired, therefore, pt has a new patient appt with Dr. Meda Coffee on 02/06/15.     Hearing/Vision screen  Hearing Screening   125Hz  250Hz  500Hz  1000Hz  2000Hz  4000Hz  8000Hz   Right ear:   Fail Fail Fail Fail   Left ear:   Fail Fail Fail Fail   Comments: Recent changes to hearing due to sinus issues per pt and wife.  States hearing has worsen due congestion.  Pt did not want an appt, states symptoms are beginning to improve.     Able to hear conversational tones today without much difficulty.   Overall, he has had issues with hearing for awhile per pt and wife.  Pt says since school.    Vision Screening Comments: Bilateral cataract surgeries- 2014 No changes.   Last eye exam:  03/2014- Dr. Jenne Campus; Dr Tommy Rainwater   Dietary issues and exercise activities discussed:  Exercise:  Stays active caring for animals, gardening, and doing small jobs around the house.  He enjoys walking and would like to increase physical activity.  States he's able, just hasn't been lately.    Diet: Eats 3 small meals per day. States foods don't taste the same anymore.  Eat a lot of beans, but not much meat.   Eats a dessert everyday.      Goals    . Increase physical activity     As tolerated.  Walking more.  Gardening.        Depression Screen PHQ 2/9 Scores 12/09/2014 09/04/2014 08/30/2013 04/18/2012  PHQ - 2 Score 0 0 0  0    Fall Risk Fall Risk  12/09/2014 09/04/2014 08/30/2013 04/18/2012    Falls in the past year? No Yes No No  Number falls in past yr: - 1 - -  Injury with Fall? - No - -    Cognitive Function: MMSE - Mini Mental State Exam 12/09/2014  Orientation to time 5  Orientation to Place 5  Registration 3  Attention/ Calculation 5  Recall 3  Language- name 2 objects 2  Language- repeat 1  Language- follow 3 step command 3  Language- read & follow direction 1  Write a sentence 1  Copy design 1  Total score 30    Screening Tests Health Maintenance  Topic Date Due  . ZOSTAVAX  01/30/2015 (Originally 05/25/1993)  . INFLUENZA VACCINE  09/01/2015  . COLONOSCOPY  04/20/2018  . TETANUS/TDAP  03/12/2023  . PNA vac Low Risk Adult  Completed        Plan:  Follow up with Dr. Charlett Blake as scheduled.  Increase physical activity as tolerated.   Eat heart healthy diet.  Increase protein intake (Ensure may help with protein intake).    Continue doing brain stimulating activities (puzzles, reading, adult coloring books, staying active) to keep memory sharp.    During the course of the visit Janziel was educated and counseled about the following appropriate screening and preventive services:   Vaccines to include Pneumoccal, Influenza, Hepatitis B, Td, Zostavax, HCV  Electrocardiogram  Colorectal cancer screening  Cardiovascular disease screening  Diabetes screening  Glaucoma screening  Nutrition counseling  Prostate cancer screening  Smoking cessation counseling  Patient Instructions (the written plan) were given to the patient.   Rudene Anda, RN   12/09/2014

## 2014-12-15 ENCOUNTER — Encounter: Payer: Self-pay | Admitting: Internal Medicine

## 2014-12-23 ENCOUNTER — Ambulatory Visit (INDEPENDENT_AMBULATORY_CARE_PROVIDER_SITE_OTHER): Payer: Medicare Other | Admitting: *Deleted

## 2014-12-23 DIAGNOSIS — K55069 Acute infarction of intestine, part and extent unspecified: Secondary | ICD-10-CM

## 2014-12-23 LAB — POCT INR: INR: 2.5

## 2015-01-06 NOTE — Progress Notes (Signed)
AWV documentation reviewed

## 2015-01-12 ENCOUNTER — Ambulatory Visit (INDEPENDENT_AMBULATORY_CARE_PROVIDER_SITE_OTHER): Payer: Medicare Other | Admitting: *Deleted

## 2015-01-12 ENCOUNTER — Other Ambulatory Visit: Payer: Self-pay

## 2015-01-12 DIAGNOSIS — K7469 Other cirrhosis of liver: Secondary | ICD-10-CM

## 2015-01-12 DIAGNOSIS — I255 Ischemic cardiomyopathy: Secondary | ICD-10-CM | POA: Diagnosis not present

## 2015-01-12 NOTE — Progress Notes (Signed)
Remote ICD transmission.   

## 2015-01-13 ENCOUNTER — Telehealth: Payer: Self-pay

## 2015-01-13 LAB — CUP PACEART REMOTE DEVICE CHECK
Brady Statistic RA Percent Paced: 83 %
Brady Statistic RV Percent Paced: 96 %
Date Time Interrogation Session: 20161212065300
HIGH POWER IMPEDANCE MEASURED VALUE: 62 Ohm
Implantable Lead Implant Date: 20060808
Implantable Lead Implant Date: 20060911
Implantable Lead Location: 753858
Implantable Lead Location: 753859
Implantable Lead Model: 158
Implantable Lead Model: 4087
Implantable Lead Serial Number: 261368
Lead Channel Impedance Value: 473 Ohm
Lead Channel Impedance Value: 486 Ohm
Lead Channel Impedance Value: 571 Ohm
Lead Channel Sensing Intrinsic Amplitude: 24.1 mV
Lead Channel Sensing Intrinsic Amplitude: 5.2 mV
Lead Channel Setting Pacing Amplitude: 2 V
Lead Channel Setting Pacing Pulse Width: 0.4 ms
Lead Channel Setting Sensing Sensitivity: 0.6 mV
Lead Channel Setting Sensing Sensitivity: 1 mV
MDC IDC LEAD IMPLANT DT: 20060808
MDC IDC LEAD LOCATION: 753860
MDC IDC LEAD SERIAL: 165392
MDC IDC MSMT BATTERY REMAINING LONGEVITY: 60 mo
MDC IDC MSMT BATTERY REMAINING PERCENTAGE: 95 %
MDC IDC PG SERIAL: 490477
MDC IDC SET LEADCHNL LV PACING PULSEWIDTH: 0.8 ms
MDC IDC SET LEADCHNL RA PACING AMPLITUDE: 2.3 V
MDC IDC SET LEADCHNL RV PACING AMPLITUDE: 2.4 V

## 2015-01-13 NOTE — Telephone Encounter (Signed)
1st ICM transmission received.  Attempted call and left message for return call.

## 2015-01-14 ENCOUNTER — Encounter: Payer: Self-pay | Admitting: Cardiology

## 2015-01-20 NOTE — Telephone Encounter (Signed)
Unable to reach for ICM follow up.  ICM transmission scheduled for 02/04/2015 for 1st encounter.

## 2015-01-21 ENCOUNTER — Ambulatory Visit (INDEPENDENT_AMBULATORY_CARE_PROVIDER_SITE_OTHER): Payer: Medicare Other | Admitting: Family Medicine

## 2015-01-21 ENCOUNTER — Ambulatory Visit (INDEPENDENT_AMBULATORY_CARE_PROVIDER_SITE_OTHER): Payer: Medicare Other | Admitting: *Deleted

## 2015-01-21 ENCOUNTER — Encounter: Payer: Self-pay | Admitting: Family Medicine

## 2015-01-21 ENCOUNTER — Telehealth: Payer: Self-pay | Admitting: Family Medicine

## 2015-01-21 VITALS — BP 119/58 | HR 66 | Temp 98.4°F | Ht 72.0 in | Wt 207.0 lb

## 2015-01-21 DIAGNOSIS — R509 Fever, unspecified: Secondary | ICD-10-CM

## 2015-01-21 DIAGNOSIS — I255 Ischemic cardiomyopathy: Secondary | ICD-10-CM | POA: Diagnosis not present

## 2015-01-21 DIAGNOSIS — R3129 Other microscopic hematuria: Secondary | ICD-10-CM

## 2015-01-21 DIAGNOSIS — K55069 Acute infarction of intestine, part and extent unspecified: Secondary | ICD-10-CM

## 2015-01-21 DIAGNOSIS — J0100 Acute maxillary sinusitis, unspecified: Secondary | ICD-10-CM

## 2015-01-21 LAB — POCT URINALYSIS DIPSTICK
GLUCOSE UA: NEGATIVE
Ketones, UA: NEGATIVE
LEUKOCYTES UA: NEGATIVE
NITRITE UA: NEGATIVE
Spec Grav, UA: >=1.03
UROBILINOGEN UA: NEGATIVE
pH, UA: 5.5

## 2015-01-21 LAB — POCT INR: INR: 3

## 2015-01-21 MED ORDER — BENZONATATE 100 MG PO CAPS: 100.0000 mg | ORAL_CAPSULE | Freq: Three times a day (TID) | ORAL | Status: DC | PRN

## 2015-01-21 MED ORDER — CEFDINIR 300 MG PO CAPS: 300.0000 mg | ORAL_CAPSULE | Freq: Two times a day (BID) | ORAL | Status: DC

## 2015-01-21 NOTE — Telephone Encounter (Signed)
[name], [facility] was notified regarding recent prescription of Cefdinir to see if patient needs to return to coumadin clinic earlier than scheduled.

## 2015-01-21 NOTE — Progress Notes (Signed)
Subjective:    Patient ID: [name], male    DOB: 1931-03-12, 81 y.o.   MRN: [id number]  HPI  [name] is an 81 year old patient who presents with a chief complaint of sinus pressure and congestion which was first noted at least one month ago which he thought was improving, however symptoms have returned. Associated symptoms of runny nose with yellow/green nasal discharge, chills, subjective fever noted, nausea, and loose stools x 4 days. Patient is also experiencing a cough that is productive of yellow sputum which wakes patient at night. No sick contacts noted and patient has avoided OTC medications due to existing cardiac conditions. Patient's wife reports that she provided patient with 6 tablets of Augmentin that she had from a previous prescription.  Review of Systems  Constitutional: Positive for chills.  HENT: Positive for congestion, postnasal drip, rhinorrhea, sinus pressure and sneezing. Negative for sore throat.   Eyes: Negative for redness and itching.  Respiratory: Positive for cough. Negative for shortness of breath.   Gastrointestinal: Positive for nausea (No vomiting episodes. Patient is eating and drinking without difficulty). Diarrhea: Loose stools noted x 4 days. Patient has a history of Crohn's    Past Medical History  Diagnosis Date  . Dizziness     Positional dizziness  . Hypertension   . Ischemic cardiomyopathy     CABG 1994 CAD catherization 1996.[name] occluded vein graft to the diagnol, other grafts patent/  catherization 2006, no PCI/ nuclear.[name] october 2011.[name] extensive scar anteroseptal and apical.. no ischemia    . CHF (congestive heart failure) (HCC)     chronic systolic  . Mitral regurgitation     mild.[name] echo november 2011  EF 35%...echo.. november 2009/ ef 35%.[name] echo november 2011  . Sinus bradycardia   . Chronotropic incompetence   . Biventricular implantable cardiac defibrillator in situ     GDT CONTAK H170-MADIT-CRT-EXPLANTED 2011 implanted  defibrillator- [employer]ant cognis, model n119-2001 Dr. Caryl Comes (11/28/2009)  . LBBB (left bundle branch block)   . Hyperlipemia   . GERD (gastroesophageal reflux disease)   . Colonic polyp 2010    pathology not clear.   . Crohn's disease ([place])   . Acute vascular insufficiency of intestine (HCC)     Mesenteric embolus...surgery 2003  . Ventral hernia     multiple, wears an abd binder, reports he hs been told he was at increased riisk for surgery  . Hypertrophy of prostate with urinary obstruction and other lower urinary tract symptoms (LUTS)   . Degenerative joint disease   . Polymyalgia rheumatica ([place])   . Tremor   . Anxiety   . Shingles   . Vitamin B12 deficiency   . CAD (coronary artery disease)     CABG 1994  /   catheterization 1996, occluded vein graft to the diagonal, other grafts patent  /   catheterization 2006, no PCI  /  nuclear, October, 2011, extensive scar anteroseptal and apical, no ischemia  . Hx of CABG     1994  . Warfarin anticoagulation     Coumadin therapy for mesenteric embolus 2003  . Carotid artery disease ([place])     Doppler, February,  2012, 0-39% bilateral, recommended followup 1 year  . Edema leg     Venous Dopplers 8/13: No DVT bilaterally  . Breast tenderness     Spironolactone was stopped and eplerinone started. Patient feels better  . Thrombocytopenia ([place]) 2008  . Edema 09/02/2013  . Acute bronchitis 09/02/2013  . Iron  deficiency anemia   . Thrush   . Colon polyp   . Atrial fibrillation (HCC)     paroxysmal    Social History   Social History  . Marital Status: Married    Spouse Name: Kennyth Lose  . Number of Children: 3  . 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 Smoking status: Former Smoker -- 1.00 packs/day for 20 years    Types: Cigarettes    Quit date: 01/31/1973  . Smokeless tobacco: Never Used  . Alcohol Use: 0.0 oz/week    0 Standard drinks or equivalent  per week     Comment: occasional  . Drug Use: No  . Sexual Activity: No     Comment: lives with wife, no dietary restrictions   Other Topics Concern  . Not on file   Social History Narrative   Lives in [place], [place] with wife.     Past Surgical History  Procedure Laterality Date  . Coronary artery bypass graft  1994  . Laparoscopic cholecystectomy  2002  . Elap w/ superior mesenteric artery embolectomy  1/03    by Dr. Jennette Banker  . Repair of incarcerated ventral hernia and lysis of adhesions  11/03    by Dr. Betsy Pries  . Implantation of biventric cardioverter-defibrillatorr      by Dr. Lovena Le in 8/06 [employer] 2011  . Implantation of guidant cognis device      model [id number]  . Left inguinal hernia repair with mesh  6/08    by Dr. Betsy Pries  . Prostate surgery  08/2012    by urology in [place]  . Esophagogastroduodenoscopy N/A 04/18/2013    Procedure: ESOPHAGOGASTRODUODENOSCOPY (EGD);  Surgeon: Jerene Bears, MD;  Location: [facility] ENDOSCOPY;  Service: Endoscopy;  Laterality: N/A;  . Colonoscopy N/A 04/19/2013    Procedure: COLONOSCOPY;  Surgeon: Jerene Bears, MD;  Location: [facility] ENDOSCOPY;  Service: Endoscopy;  Laterality: N/A;  . Cataract extraction, bilateral  2014    Family History  Problem Relation Age of Onset  . Heart disease Mother   . Hypertension    . Hyperlipidemia    . Tremor Sister   . Heart failure Sister   . Heart disease Father 65  . Hypertension Father   . Appendicitis Brother     ruptured  . Diabetes Son   . Heart disease Sister   . Hypertension Sister   . Dementia Sister   . Heart disease Sister   . Hypertension Brother   . Kidney disease Brother   . Myasthenia gravis Brother   . Cirrhosis Brother   . Leukemia Brother   . Heart disease Sister   . Edema Sister     pedal edema    No Known Allergies  Current Outpatient Prescriptions on File Prior to Visit  Medication Sig Dispense Refill  . carvedilol (COREG) 6.25 MG tablet Take 1  tablet by mouth  twice a day with meals 180 tablet 2  . cholecalciferol (VITAMIN D) 1000 UNITS tablet Take 1,000 Units by mouth daily.     . furosemide (LASIX) 40 MG tablet Take 1 tablet by mouth two  times daily 180 tablet 3  . KLOR-CON M20 20 MEQ tablet TAKE 1 TABLET BY MOUTH DAILY. 30 tablet 3  . lisinopril (PRINIVIL,ZESTRIL) 10 MG tablet Take 1 tablet by mouth  daily 90 tablet 3  . pantoprazole (PROTONIX)  40 MG tablet Take 1 tablet by mouth  every day at 12 noon 90 tablet 3  . simvastatin (ZOCOR) 20 MG tablet Take 1 tablet by mouth at  bedtime 90 tablet 1  . vitamin B-12 (CYANOCOBALAMIN) 1000 MCG tablet Take 1,000 mcg by mouth every other day.    . warfarin (COUMADIN) 5 MG tablet Take 1 tablet (5 mg total) by mouth as directed. 150 tablet 1  . pneumococcal 13-valent conjugate vaccine (PREVNAR 13) SUSP injection Inject 0.5 mLs into the muscle tomorrow at 10 am. (Patient not taking: Reported on 01/21/2015) 0.5 mL 0  . zoster vaccine live, PF, (ZOSTAVAX) 15400 UNT/0.65ML injection Inject 19,400 Units into the skin once. (Patient not taking: Reported on 12/09/2014) 1 each 0   No current facility-administered medications on file prior to visit.    BP 119/58 mmHg  Pulse 66  Temp(Src) 98.4 F (36.9 C) (Oral)  Ht 6' (1.829 m)  Wt 207 lb (93.895 kg)  BMI 28.07 kg/m2  SpO2 94%       Objective:   Physical Exam  Constitutional: He is oriented to person, place, and time. He appears well-developed and well-nourished.  HENT:  Right Ear: A middle ear effusion is present.  Left Ear: A middle ear effusion is present.  Nose: Right sinus exhibits maxillary sinus tenderness. Left sinus exhibits maxillary sinus tenderness.  Patient has an upper and lower denture.  Eyes: Pupils are equal, round, and reactive to light.  Cardiovascular: Normal rate and normal heart sounds.   Pulmonary/Chest: Effort normal and breath sounds normal. He has no wheezes. He has no rales.  Abdominal:  Patient is wearing  an abdominal binder; hx of hernia repair   Lymphadenopathy:    He has no cervical adenopathy.  Neurological: He is alert and oriented to person, place, and time.          Assessment & Plan:  Sinusitis: Suspect maxillary sinusitis and "double sickening" due to patient report that symptoms were present one month ago, improved somewhat and have now returned. Rx for Cefdinir, encouraged fluids, rest, and use of probiotic for loose stools during antibiotic therapy.  Cough: Rx for benzonatate (Tessalon) for cough as needed. UA: Microscopic blood 3+ noted in urine sample. Urine culture ordered, results are pending. Suspect possible UTI due to report of chills, fever, and patient's age. Advised patient to contact clinic for further evaluation if symptoms do not improve in 3 days, worsen, or he develops a temp >101. Patient voiced understanding. Also advised patient that I sent a message to Elberta Leatherwood, [facility] at the coumadin clinic regarding medications associated with this visit. Patient voiced understanding and stated he would follow up with her if she would like him to return to the coumadin clinic earlier than scheduled.

## 2015-01-21 NOTE — Progress Notes (Signed)
Pre visit review using our clinic review tool, if applicable. No additional management support is needed unless otherwise documented below in the visit note. 

## 2015-01-21 NOTE — Patient Instructions (Signed)
Please complete antibiotic as prescribed. If symptoms do not improve or worsen in 3-4 days, please contact office for further evaluation. Increase fluids, rest, and you may add a probiotic for loose stools while taking antibiotic.   Sinusitis, Adult Sinusitis is redness, soreness, and inflammation of the paranasal sinuses. Paranasal sinuses are air pockets within the bones of your face. They are located beneath your eyes, in the middle of your forehead, and above your eyes. In healthy paranasal sinuses, mucus is able to drain out, and air is able to circulate through them by way of your nose. However, when your paranasal sinuses are inflamed, mucus and air can become trapped. This can allow bacteria and other germs to grow and cause infection. Sinusitis can develop quickly and last only a short time (acute) or continue over a long period (chronic). Sinusitis that lasts for more than 12 weeks is considered chronic. CAUSES Causes of sinusitis include:  Allergies.  Structural abnormalities, such as displacement of the cartilage that separates your nostrils (deviated septum), which can decrease the air flow through your nose and sinuses and affect sinus drainage.  Functional abnormalities, such as when the small hairs (cilia) that line your sinuses and help remove mucus do not work properly or are not present. SIGNS AND SYMPTOMS Symptoms of acute and chronic sinusitis are the same. The primary symptoms are pain and pressure around the affected sinuses. Other symptoms include:  Upper toothache.  Earache.  Headache.  Bad breath.  Decreased sense of smell and taste.  A cough, which worsens when you are lying flat.  Fatigue.  Fever.  Thick drainage from your nose, which often is green and may contain pus (purulent).  Swelling and warmth over the affected sinuses. DIAGNOSIS Your health care provider will perform a physical exam. During your exam, your health care provider may perform any  of the following to help determine if you have acute sinusitis or chronic sinusitis:  Look in your nose for signs of abnormal growths in your nostrils (nasal polyps).  Tap over the affected sinus to check for signs of infection.  View the inside of your sinuses using an imaging device that has a light attached (endoscope). If your health care provider suspects that you have chronic sinusitis, one or more of the following tests may be recommended:  Allergy tests.  Nasal culture. A sample of mucus is taken from your nose, sent to a lab, and screened for bacteria.  Nasal cytology. A sample of mucus is taken from your nose and examined by your health care provider to determine if your sinusitis is related to an allergy. TREATMENT Most cases of acute sinusitis are related to a viral infection and will resolve on their own within 10 days. Sometimes, medicines are prescribed to help relieve symptoms of both acute and chronic sinusitis. These may include pain medicines, decongestants, nasal steroid sprays, or saline sprays. However, for sinusitis related to a bacterial infection, your health care provider will prescribe antibiotic medicines. These are medicines that will help kill the bacteria causing the infection. Rarely, sinusitis is caused by a fungal infection. In these cases, your health care provider will prescribe antifungal medicine. For some cases of chronic sinusitis, surgery is needed. Generally, these are cases in which sinusitis recurs more than 3 times per year, despite other treatments. HOME CARE INSTRUCTIONS  Drink plenty of water. Water helps thin the mucus so your sinuses can drain more easily.  Use a humidifier.  Inhale steam 3-4 times a day (  for example, sit in the bathroom with the shower running).  Apply a warm, moist washcloth to your face 3-4 times a day, or as directed by your health care provider.  Use saline nasal sprays to help moisten and clean your sinuses.  Take  medicines only as directed by your health care provider.  If you were prescribed either an antibiotic or antifungal medicine, finish it all even if you start to feel better. SEEK IMMEDIATE MEDICAL CARE IF:  You have increasing pain or severe headaches.  You have nausea, vomiting, or drowsiness.  You have swelling around your face.  You have vision problems.  You have a stiff neck.  You have difficulty breathing.   This information is not intended to replace advice given to you by your health care provider. Make sure you discuss any questions you have with your health care provider.   Document Released: 01/17/2005 Document Revised: 02/07/2014 Document Reviewed: 02/01/2011 Elsevier Interactive Patient Education [employer].

## 2015-01-22 ENCOUNTER — Other Ambulatory Visit: Payer: Self-pay | Admitting: Cardiology

## 2015-01-22 LAB — URINE CULTURE
Colony Count: NO GROWTH
ORGANISM ID, BACTERIA: NO GROWTH

## 2015-01-22 NOTE — Telephone Encounter (Signed)
simvastatin (ZOCOR) 20 MG tablet  Medication   Date: 10/16/2014  Department: [facility] Office  Ordering/Authorizing: Carlena Bjornstad, MD      Order Providers    Prescribing Provider Encounter Provider   Carlena Bjornstad, MD Carlena Bjornstad, MD    Medication Detail      Disp Refills Start End     simvastatin (ZOCOR) 20 MG tablet 90 tablet 1 10/16/2014     Sig: Take 1 tablet by mouth at bedtime    E-Prescribing Status: Receipt confirmed by pharmacy (10/16/2014 12:32 PM EDT)     Pharmacy    [facility] - [place], [place] to march 2017

## 2015-01-23 ENCOUNTER — Telehealth: Payer: Self-pay | Admitting: Family Medicine

## 2015-01-23 ENCOUNTER — Other Ambulatory Visit: Payer: Self-pay | Admitting: Cardiology

## 2015-01-23 DIAGNOSIS — R8290 Unspecified abnormal findings in urine: Secondary | ICD-10-CM

## 2015-01-23 DIAGNOSIS — R3129 Other microscopic hematuria: Secondary | ICD-10-CM

## 2015-01-23 NOTE — Telephone Encounter (Signed)
Please call patient and let him know that his urine culture showed no growth. UA noted blood in urine so he needs to return to clinic in 2 weeks for a repeat UA. Thank you

## 2015-01-23 NOTE — Telephone Encounter (Signed)
Notified pt's wife and pt will repeat U/A at follow up with PCP on 02/05/15. Future order entered.

## 2015-01-28 ENCOUNTER — Encounter: Payer: Self-pay | Admitting: *Deleted

## 2015-02-04 ENCOUNTER — Telehealth: Payer: Self-pay | Admitting: Cardiology

## 2015-02-04 ENCOUNTER — Telehealth: Payer: Self-pay

## 2015-02-04 NOTE — Telephone Encounter (Signed)
ICM transmission received.  Attempted call and left message for return call.

## 2015-02-04 NOTE — Telephone Encounter (Signed)
Confirmed remote transmission w/ pt wife.   

## 2015-02-05 ENCOUNTER — Ambulatory Visit (HOSPITAL_BASED_OUTPATIENT_CLINIC_OR_DEPARTMENT_OTHER)
Admission: RE | Admit: 2015-02-05 | Discharge: 2015-02-05 | Disposition: A | Discharge status: Home / Self Care | Payer: Medicare Other | Source: Ambulatory Visit | Attending: Family Medicine | Admitting: Family Medicine

## 2015-02-05 ENCOUNTER — Encounter: Payer: Self-pay | Admitting: Family Medicine

## 2015-02-05 ENCOUNTER — Ambulatory Visit (INDEPENDENT_AMBULATORY_CARE_PROVIDER_SITE_OTHER): Payer: Medicare Other | Admitting: Family Medicine

## 2015-02-05 VITALS — BP 118/82 | HR 67 | Temp 97.4°F | Ht 72.0 in | Wt 205.4 lb

## 2015-02-05 DIAGNOSIS — E559 Vitamin D deficiency, unspecified: Secondary | ICD-10-CM | POA: Diagnosis not present

## 2015-02-05 DIAGNOSIS — I255 Ischemic cardiomyopathy: Secondary | ICD-10-CM

## 2015-02-05 DIAGNOSIS — E785 Hyperlipidemia, unspecified: Secondary | ICD-10-CM | POA: Diagnosis not present

## 2015-02-05 DIAGNOSIS — R05 Cough: Secondary | ICD-10-CM | POA: Diagnosis not present

## 2015-02-05 DIAGNOSIS — B370 Candidal stomatitis: Secondary | ICD-10-CM | POA: Insufficient documentation

## 2015-02-05 DIAGNOSIS — R197 Diarrhea, unspecified: Secondary | ICD-10-CM

## 2015-02-05 DIAGNOSIS — R918 Other nonspecific abnormal finding of lung field: Secondary | ICD-10-CM | POA: Insufficient documentation

## 2015-02-05 DIAGNOSIS — I340 Nonrheumatic mitral (valve) insufficiency: Secondary | ICD-10-CM | POA: Diagnosis not present

## 2015-02-05 DIAGNOSIS — I25810 Atherosclerosis of coronary artery bypass graft(s) without angina pectoris: Secondary | ICD-10-CM | POA: Diagnosis not present

## 2015-02-05 DIAGNOSIS — J209 Acute bronchitis, unspecified: Secondary | ICD-10-CM | POA: Insufficient documentation

## 2015-02-05 DIAGNOSIS — I10 Essential (primary) hypertension: Secondary | ICD-10-CM | POA: Diagnosis not present

## 2015-02-05 DIAGNOSIS — Z951 Presence of aortocoronary bypass graft: Secondary | ICD-10-CM | POA: Diagnosis not present

## 2015-02-05 DIAGNOSIS — I5022 Chronic systolic (congestive) heart failure: Secondary | ICD-10-CM | POA: Diagnosis not present

## 2015-02-05 HISTORY — DX: Candidal stomatitis: B37.0

## 2015-02-05 LAB — COMPREHENSIVE METABOLIC PANEL
ALBUMIN: 3.9 g/dL (ref 3.5–5.2)
ALK PHOS: 92 U/L (ref 39–117)
ALT: 18 U/L (ref 0–53)
AST: 29 U/L (ref 0–37)
BUN: 9 mg/dL (ref 6–23)
CO2: 30 mEq/L (ref 19–32)
Calcium: 9.7 mg/dL (ref 8.4–10.5)
Chloride: 102 mEq/L (ref 96–112)
Creatinine, Ser: 1.03 mg/dL (ref 0.40–1.50)
GFR: 73.54 mL/min (ref >60.00)
GLUCOSE: 122 mg/dL — AB (ref 70–99)
POTASSIUM: 3.7 meq/L (ref 3.5–5.1)
Sodium: 140 mEq/L (ref 135–145)
TOTAL PROTEIN: 6.3 g/dL (ref 6.0–8.3)
Total Bilirubin: 2.7 mg/dL — ABNORMAL HIGH (ref 0.2–1.2)

## 2015-02-05 LAB — CBC
HEMATOCRIT: 48.3 % (ref 39.0–52.0)
HEMOGLOBIN: 16.3 g/dL (ref 13.0–17.0)
MCHC: 33.7 g/dL (ref 30.0–36.0)
MCV: 88.8 fl (ref 78.0–100.0)
PLATELETS: 78 10*3/uL — AB (ref 150.0–400.0)
RBC: 5.44 Mil/uL (ref 4.22–5.81)
RDW: 14.8 % (ref 11.5–15.5)
WBC: 7 10*3/uL (ref 4.0–10.5)

## 2015-02-05 LAB — LIPID PANEL
CHOLESTEROL: 166 mg/dL (ref 0–200)
HDL: 49.5 mg/dL (ref >39.00)
LDL CALC: 80 mg/dL (ref 0–99)
NonHDL: 116.67
Total CHOL/HDL Ratio: 3
Triglycerides: 185 mg/dL — ABNORMAL HIGH (ref 0.0–149.0)
VLDL: 37 mg/dL (ref 0.0–40.0)

## 2015-02-05 LAB — PROTIME-INR
INR: 3.3 ratio — AB (ref 0.8–1.0)
PROTHROMBIN TIME: 35.8 s — AB (ref 9.6–13.1)

## 2015-02-05 LAB — TSH: TSH: 3.19 u[IU]/mL (ref 0.35–4.50)

## 2015-02-05 LAB — VITAMIN D 25 HYDROXY (VIT D DEFICIENCY, FRACTURES): VITD: 36.56 ng/mL (ref 30.00–100.00)

## 2015-02-05 MED ORDER — DOXYCYCLINE HYCLATE 100 MG PO TABS: 100.0000 mg | ORAL_TABLET | Freq: Two times a day (BID) | ORAL | Status: DC

## 2015-02-05 MED ORDER — NYSTATIN 100000 UNIT/ML MT SUSP: 5.0000 mL | Freq: Four times a day (QID) | OROMUCOSAL | Status: DC

## 2015-02-05 MED ORDER — CARVEDILOL 6.25 MG PO TABS: ORAL_TABLET | ORAL | Status: DC

## 2015-02-05 NOTE — Progress Notes (Signed)
Pre visit review using our clinic review tool, if applicable. No additional management support is needed unless otherwise documented below in the visit note. 

## 2015-02-05 NOTE — Progress Notes (Signed)
Subjective:    Patient ID: [name], male    DOB: Jul 23, 1931, 82 y.o.   MRN: [id number]  Chief Complaint  Patient presents with  . Follow-up    HPI Patient is in today for follow up. Unfortunately he is not feeling well. He is complaining of persistent head congestion with yellow rhinorrhea. It has now moved into his chest and his cough is also productive of yellow phlegm. He is struggling with some tightness in his chest but no actual chest pain. Denies palpitations, wheezing but does have some shortness of breath with exertion. Endorses anorexia and some infrequent loose stool. No bloody or tarry stool. No nausea vomiting. Is complaining of a white plaque on his tongue status post some antibiotics. With the cefdinir he did have an improvement in fevers but the rest of his symptoms are persistent. Also endorses some myalgias and malaise. Past Medical History  Diagnosis Date  . Dizziness     Positional dizziness  . Hypertension   . Ischemic cardiomyopathy     CABG 1994 CAD catherization 1996.[name] occluded vein graft to the diagnol, other grafts patent/  catherization 2006, no PCI/ nuclear.[name] october 2011.[name] extensive scar anteroseptal and apical.. no ischemia    . CHF (congestive heart failure) (HCC)     chronic systolic  . Mitral regurgitation     mild.[name] echo november 2011  EF 35%...echo.. november 2009/ ef 35%.[name] echo november 2011  . Sinus bradycardia   . Chronotropic incompetence   . Biventricular implantable cardiac defibrillator in situ     GDT CONTAK H170-MADIT-CRT-EXPLANTED 2011 implanted defibrillator- Guidant cognis, model n119-2001 Dr. Caryl Comes (11/28/2009)  . LBBB (left bundle branch block)   . Hyperlipemia   . GERD (gastroesophageal reflux disease)   . Colonic polyp 2010    pathology not clear.   . Crohn's disease ([place])   . Acute vascular insufficiency of intestine (HCC)     Mesenteric embolus...surgery 2003  . Ventral hernia     multiple, wears an abd binder, reports  he hs been told he was at increased riisk for surgery  . Hypertrophy of prostate with urinary obstruction and other lower urinary tract symptoms (LUTS)   . Degenerative joint disease   . Polymyalgia rheumatica ([place])   . Tremor   . Anxiety   . Shingles   . Vitamin B12 deficiency   . CAD (coronary artery disease)     CABG 1994  /   catheterization 1996, occluded vein graft to the diagonal, other grafts patent  /   catheterization 2006, no PCI  /  nuclear, October, 2011, extensive scar anteroseptal and apical, no ischemia  . Hx of CABG     1994  . Warfarin anticoagulation     Coumadin therapy for mesenteric embolus 2003  . Carotid artery disease ([place])     Doppler, February,  2012, 0-39% bilateral, recommended followup 1 year  . Edema leg     Venous Dopplers 8/13: No DVT bilaterally  . Breast tenderness     Spironolactone was stopped and eplerinone started. Patient feels better  . Thrombocytopenia ([place]) 2008  . Edema 09/02/2013  . Acute bronchitis 09/02/2013  . Iron deficiency anemia   . Thrush   . Colon polyp   . Atrial fibrillation (HCC)     paroxysmal  . Oral thrush 02/05/2015    Past Surgical History  Procedure Laterality Date  . Coronary artery bypass graft  1994  . Laparoscopic cholecystectomy  2002  . Elap w/  superior mesenteric artery embolectomy  1/03    by Dr. Jennette Banker  . Repair of incarcerated ventral hernia and lysis of adhesions  11/03    by Dr. Betsy Pries  . Implantation of biventric cardioverter-defibrillatorr      by Dr. Lovena Le in 8/06 Guidant Tavernier 2011  . Implantation of guidant cognis device      model [id number]  . Left inguinal hernia repair with mesh  6/08    by Dr. Betsy Pries  . Prostate surgery  08/2012    by urology in [place]  . Esophagogastroduodenoscopy N/A 04/18/2013    Procedure: ESOPHAGOGASTRODUODENOSCOPY (EGD);  Surgeon: Jerene Bears, MD;  Location: [facility] ENDOSCOPY;  Service: Endoscopy;  Laterality: N/A;  . Colonoscopy N/A 04/19/2013     Procedure: COLONOSCOPY;  Surgeon: Jerene Bears, MD;  Location: [facility] ENDOSCOPY;  Service: Endoscopy;  Laterality: N/A;  . Cataract extraction, bilateral  2014    Family History  Problem Relation Age of Onset  . Heart disease Mother   . Hypertension    . Hyperlipidemia    . Tremor Sister   . Heart failure Sister   . Heart disease Father 35  . Hypertension Father   . Appendicitis Brother     ruptured  . Diabetes Son   . Heart disease Sister   . Hypertension Sister   . Dementia Sister   . Heart disease Sister   . Hypertension Brother   . Kidney disease Brother   . Myasthenia gravis Brother   . Cirrhosis Brother   . Leukemia Brother   . Heart disease Sister   . Edema Sister     pedal edema    Social History   Social History  . Marital Status: Married    Spouse Name: Kennyth Lose  . Number of Children: 3  . 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 Smoking status: Former Smoker -- 1.00 packs/day for 20 years    Types: Cigarettes    Quit date: 01/31/1973  . Smokeless tobacco: Never Used  . Alcohol Use: 0.0 oz/week    0 Standard drinks or equivalent per week     Comment: occasional  . Drug Use: No  . Sexual Activity: No     Comment: lives with wife, no dietary restrictions   Other Topics Concern  . Not on file   Social History Narrative   Lives in [place], [place] with wife.     Outpatient Prescriptions Prior to Visit  Medication Sig Dispense Refill  . cholecalciferol (VITAMIN D) 1000 UNITS tablet Take 1,000 Units by mouth daily.     . folic acid (FOLVITE) 1 MG tablet TAKE 1 TABLET EVERY DAY    . furosemide (LASIX) 40 MG tablet Take 1 tablet by mouth two  times daily 180 tablet 3  . KLOR-CON M20 20 MEQ tablet TAKE 1 TABLET BY MOUTH DAILY. 30 tablet 3  . lisinopril (PRINIVIL,ZESTRIL) 10 MG tablet Take 1 tablet by mouth  daily 90 tablet 3  . pantoprazole (PROTONIX) 40 MG tablet Take 1  tablet by mouth  every day at 12 noon 90 tablet 3  . simvastatin (ZOCOR) 20 MG tablet Take 1 tablet by mouth at  bedtime 90 tablet 1  . vitamin B-12 (CYANOCOBALAMIN) 1000 MCG tablet Take 1,000 mcg by mouth every other day.    . warfarin (COUMADIN) 5 MG tablet Take 1 tablet (  5 mg total) by mouth as directed. 150 tablet 1  . carvedilol (COREG) 6.25 MG tablet Take 1 tablet by mouth  twice a day with meals 180 tablet 2  . pneumococcal 13-valent conjugate vaccine (PREVNAR 13) SUSP injection Inject 0.5 mLs into the muscle tomorrow at 10 am. (Patient not taking: Reported on 02/06/2015) 0.5 mL 0  . zoster vaccine live, PF, (ZOSTAVAX) 73419 UNT/0.65ML injection Inject 19,400 Units into the skin once. (Patient not taking: Reported on 02/06/2015) 1 each 0  . benzonatate (TESSALON) 100 MG capsule Take 1 capsule (100 mg total) by mouth 3 (three) times daily as needed for cough. 20 capsule 0  . cefdinir (OMNICEF) 300 MG capsule Take 1 capsule (300 mg total) by mouth 2 (two) times daily. 20 capsule 0  . KLOR-CON M20 20 MEQ tablet TAKE 1 TABLET BY MOUTH DAILY. 30 tablet 3   No facility-administered medications prior to visit.    No Known Allergies  Review of Systems  Constitutional: Negative for fever and malaise/fatigue.  HENT: Positive for congestion. Negative for ear discharge and ear pain.   Eyes: Negative for discharge.  Respiratory: Positive for cough and sputum production. Negative for shortness of breath.   Cardiovascular: Negative for chest pain, palpitations and leg swelling.  Gastrointestinal: Negative for nausea and abdominal pain.  Genitourinary: Negative for dysuria.  Musculoskeletal: Negative for falls.  Skin: Negative for rash.  Neurological: Positive for headaches. Negative for loss of consciousness.  Endo/Heme/Allergies: Negative for environmental allergies.  Psychiatric/Behavioral: Negative for depression. The patient is not nervous/anxious.        Objective:    Physical Exam    Constitutional: He is oriented to person, place, and time. He appears well-developed and well-nourished. No distress.  HENT:  Head: Normocephalic and atraumatic.  Nose: Nose normal.  Eyes: Right eye exhibits no discharge. Left eye exhibits no discharge.  Neck: Normal range of motion. Neck supple.  Cardiovascular: Normal rate and regular rhythm.   No murmur heard. Pulmonary/Chest: Effort normal and breath sounds normal.  Abdominal: Soft. Bowel sounds are normal. There is no tenderness.  Musculoskeletal: He exhibits no edema.  Neurological: He is alert and oriented to person, place, and time.  Skin: Skin is warm and dry.  Psychiatric: He has a normal mood and affect.  Nursing note and vitals reviewed.   BP 118/82 mmHg  Pulse 67  Temp(Src) 97.4 F (36.3 C) (Oral)  Ht 6' (1.829 m)  Wt 205 lb 6 oz (93.157 kg)  BMI 27.85 kg/m2  SpO2 93% Wt Readings from Last 3 Encounters:  02/06/15 206 lb (93.441 kg)  02/05/15 205 lb 6 oz (93.157 kg)  01/21/15 207 lb (93.895 kg)     Lab Results  Component Value Date   WBC 7.0 02/05/2015   HGB 16.3 02/05/2015   HCT 48.3 02/05/2015   PLT 78.0* 02/05/2015   GLUCOSE 122* 02/05/2015   CHOL 166 02/05/2015   TRIG 185.0* 02/05/2015   HDL 49.50 02/05/2015   LDLDIRECT 81.6 12/30/2013   LDLCALC 80 02/05/2015   ALT 18 02/05/2015   AST 29 02/05/2015   NA 140 02/05/2015   K 3.7 02/05/2015   CL 102 02/05/2015   CREATININE 1.03 02/05/2015   BUN 9 02/05/2015   CO2 30 02/05/2015   TSH 3.19 02/05/2015   PSA 2.93 11/24/2010   INR 3.3* 02/05/2015    Lab Results  Component Value Date   TSH 3.19 02/05/2015   Lab Results  Component Value Date   WBC  7.0 02/05/2015   HGB 16.3 02/05/2015   HCT 48.3 02/05/2015   MCV 88.8 02/05/2015   PLT 78.0* 02/05/2015   Lab Results  Component Value Date   NA 140 02/05/2015   K 3.7 02/05/2015   CO2 30 02/05/2015   GLUCOSE 122* 02/05/2015   BUN 9 02/05/2015   CREATININE 1.03 02/05/2015   BILITOT 2.7*  02/05/2015   ALKPHOS 92 02/05/2015   AST 29 02/05/2015   ALT 18 02/05/2015   PROT 6.3 02/05/2015   ALBUMIN 3.9 02/05/2015   CALCIUM 9.7 02/05/2015   GFR 73.54 02/05/2015   Lab Results  Component Value Date   CHOL 166 02/05/2015   Lab Results  Component Value Date   HDL 49.50 02/05/2015   Lab Results  Component Value Date   LDLCALC 80 02/05/2015   Lab Results  Component Value Date   TRIG 185.0* 02/05/2015   Lab Results  Component Value Date   CHOLHDL 3 02/05/2015   No results found for: HGBA1C     Assessment & Plan:   Problem List Items Addressed This Visit    Acute bronchitis    Encouraged increased rest and hydration, add probiotics, zinc such as Coldeze or Xicam. Treat fevers as needed. Given Doxycycline and Mucinex bid      Relevant Medications   doxycycline (VIBRA-TABS) 100 MG tablet   Other Relevant Orders   TSH (Completed)   CBC (Completed)   Lipid panel (Completed)   VITAMIN D 25 Hydroxy (Vit-D Deficiency, Fractures) (Completed)   Comprehensive metabolic panel (Completed)   Clostridium Difficile by PCR   Stool Culture   DG Chest 2 View (Completed)   INR/PT (Completed)   CAD (coronary artery disease)    Follows with cardiology, asymptomatic      Relevant Medications   carvedilol (COREG) 6.25 MG tablet   Other Relevant Orders   TSH (Completed)   CBC (Completed)   Lipid panel (Completed)   VITAMIN D 25 Hydroxy (Vit-D Deficiency, Fractures) (Completed)   Comprehensive metabolic panel (Completed)   Clostridium Difficile by PCR   Stool Culture   INR/PT (Completed)   Chronic systolic heart failure (HCC)   Relevant Medications   carvedilol (COREG) 6.25 MG tablet   Other Relevant Orders   TSH (Completed)   CBC (Completed)   Lipid panel (Completed)   VITAMIN D 25 Hydroxy (Vit-D Deficiency, Fractures) (Completed)   Comprehensive metabolic panel (Completed)   Clostridium Difficile by PCR   Stool Culture   INR/PT (Completed)   Hyperlipemia     Tolerating statin, encouraged heart healthy diet, avoid trans fats, minimize simple carbs and saturated fats. Increase exercise as tolerated      Relevant Medications   carvedilol (COREG) 6.25 MG tablet   Other Relevant Orders   TSH (Completed)   CBC (Completed)   Lipid panel (Completed)   VITAMIN D 25 Hydroxy (Vit-D Deficiency, Fractures) (Completed)   Comprehensive metabolic panel (Completed)   Clostridium Difficile by PCR   Stool Culture   INR/PT (Completed)   Hypertension    Well controlled, no changes to meds. Encouraged heart healthy diet such as the DASH diet and exercise as tolerated.       Relevant Medications   carvedilol (COREG) 6.25 MG tablet   Other Relevant Orders   TSH (Completed)   CBC (Completed)   Lipid panel (Completed)   VITAMIN D 25 Hydroxy (Vit-D Deficiency, Fractures) (Completed)   Comprehensive metabolic panel (Completed)   Clostridium Difficile by PCR   Stool Culture  INR/PT (Completed)   Mitral regurgitation   Relevant Medications   carvedilol (COREG) 6.25 MG tablet   Other Relevant Orders   TSH (Completed)   CBC (Completed)   Lipid panel (Completed)   VITAMIN D 25 Hydroxy (Vit-D Deficiency, Fractures) (Completed)   Comprehensive metabolic panel (Completed)   Clostridium Difficile by PCR   Stool Culture   INR/PT (Completed)   Oral thrush - Primary    Given Nystatin liquid to use       Relevant Medications   nystatin (MYCOSTATIN) 100000 UNIT/ML suspension   Other Relevant Orders   TSH (Completed)   CBC (Completed)   Lipid panel (Completed)   VITAMIN D 25 Hydroxy (Vit-D Deficiency, Fractures) (Completed)   Comprehensive metabolic panel (Completed)   Clostridium Difficile by PCR   Stool Culture   INR/PT (Completed)   Vitamin D deficiency    WNL, continue supplements      Relevant Orders   TSH (Completed)   CBC (Completed)   Lipid panel (Completed)   VITAMIN D 25 Hydroxy (Vit-D Deficiency, Fractures) (Completed)   Comprehensive  metabolic panel (Completed)   Clostridium Difficile by PCR   Stool Culture   INR/PT (Completed)    Other Visit Diagnoses    Diarrhea, unspecified type        Relevant Orders    TSH (Completed)    CBC (Completed)    Lipid panel (Completed)    VITAMIN D 25 Hydroxy (Vit-D Deficiency, Fractures) (Completed)    Comprehensive metabolic panel (Completed)    Clostridium Difficile by PCR    Stool Culture    INR/PT (Completed)       I have discontinued Mr. Cueva cefdinir. I am also having him start on doxycycline and nystatin. Additionally, I am having him maintain his cholecalciferol, pantoprazole, lisinopril, furosemide, vitamin B-12, warfarin, simvastatin, folic acid, benzonatate, KLOR-CON M20, and carvedilol.  Meds ordered this encounter  Medications  . carvedilol (COREG) 6.25 MG tablet    Sig: Take 1 tablet by mouth  twice a day with meals    Dispense:  180 tablet    Refill:  2  . doxycycline (VIBRA-TABS) 100 MG tablet    Sig: Take 1 tablet (100 mg total) by mouth 2 (two) times daily.    Dispense:  20 tablet    Refill:  0  . nystatin (MYCOSTATIN) 100000 UNIT/ML suspension    Sig: Take 5 mLs (500,000 Units total) by mouth 4 (four) times daily.    Dispense:  240 mL    Refill:  1     Kagan Hietpas, MD

## 2015-02-05 NOTE — Patient Instructions (Signed)

## 2015-02-06 ENCOUNTER — Encounter: Payer: Self-pay | Admitting: Cardiology

## 2015-02-06 ENCOUNTER — Telehealth: Payer: Self-pay | Admitting: Family Medicine

## 2015-02-06 ENCOUNTER — Ambulatory Visit (INDEPENDENT_AMBULATORY_CARE_PROVIDER_SITE_OTHER): Payer: Medicare Other | Admitting: Cardiology

## 2015-02-06 VITALS — BP 132/80 | HR 60 | Ht 72.0 in | Wt 206.0 lb

## 2015-02-06 DIAGNOSIS — I5043 Acute on chronic combined systolic (congestive) and diastolic (congestive) heart failure: Secondary | ICD-10-CM | POA: Insufficient documentation

## 2015-02-06 DIAGNOSIS — I5022 Chronic systolic (congestive) heart failure: Secondary | ICD-10-CM | POA: Diagnosis not present

## 2015-02-06 DIAGNOSIS — E785 Hyperlipidemia, unspecified: Secondary | ICD-10-CM

## 2015-02-06 DIAGNOSIS — I255 Ischemic cardiomyopathy: Secondary | ICD-10-CM

## 2015-02-06 DIAGNOSIS — I10 Essential (primary) hypertension: Secondary | ICD-10-CM

## 2015-02-06 DIAGNOSIS — Z95810 Presence of automatic (implantable) cardiac defibrillator: Secondary | ICD-10-CM

## 2015-02-06 NOTE — Patient Instructions (Signed)
Your physician recommends that you continue on your current medications as directed. Please refer to the Current Medication list given to you today.    Your physician wants you to follow-up in: [place] will receive a reminder letter in the mail two months in advance. If you don't receive a letter, please call our office to schedule the follow-up appointment.

## 2015-02-06 NOTE — Telephone Encounter (Signed)
Relation to QW:BEQU Call back number:[phone] Pharmacy:  Reason for call:  Inquiring about lab results, patient states home phone was out of order

## 2015-02-06 NOTE — Progress Notes (Signed)
Cardiology Office Note  Date:  02/06/2015   ID:  [name], DOB [birth year], MRN [id number]  PCP:  Penni Homans, MD  Cardiologist:  Dorothy Spark, MD   Chief complain: LE edema   History of Present Illness: [name] is a 82 y.o. male , previously patient of the [name], with h/o CAD, s/p CABG, s/p BiV ICD placement for ischemic CMP, LVEF 35%, his last catheterization in 2006 demonstrated a total right and 80% marginal. Vein graft to the diagonal was occluded and other bypasses were open; ejection fraction 2007 was about 35%  Nuclear scan Oct 2011>>scar no ischemia.  He has been last seen 6 months ago, today he states that he feels well, denies chest pain, DOE, orthopnea, PNE. He stays very active and gets LE edema on the days when he walks more. Today increased swelling, he put compressions stockings on this am. No palpitations, ICD shocks or syncope.  Complaint with his meds.    Past Medical History  Diagnosis Date  . Dizziness     Positional dizziness  . Hypertension   . Ischemic cardiomyopathy     CABG 1994 CAD catherization 1996.[name] occluded vein graft to the diagnol, other grafts patent/  catherization 2006, no PCI/ nuclear.[name] october 2011.[name] extensive scar anteroseptal and apical.. no ischemia    . CHF (congestive heart failure) (HCC)     chronic systolic  . Mitral regurgitation     mild.[name] echo november 2011  EF 35%...echo.. november 2009/ ef 35%.[name] echo november 2011  . Sinus bradycardia   . Chronotropic incompetence   . Biventricular implantable cardiac defibrillator in situ     GDT CONTAK H170-MADIT-CRT-EXPLANTED 2011 implanted defibrillator- Guidant cognis, model n119-2001 Dr. Caryl Comes (11/28/2009)  . LBBB (left bundle branch block)   . Hyperlipemia   . GERD (gastroesophageal reflux disease)   . Colonic polyp 2010    pathology not clear.   . Crohn's disease ([place])   . Acute vascular insufficiency of intestine (HCC)     Mesenteric embolus...surgery 2003  .  Ventral hernia     multiple, wears an abd binder, reports he hs been told he was at increased riisk for surgery  . Hypertrophy of prostate with urinary obstruction and other lower urinary tract symptoms (LUTS)   . Degenerative joint disease   . Polymyalgia rheumatica ([place])   . Tremor   . Anxiety   . Shingles   . Vitamin B12 deficiency   . CAD (coronary artery disease)     CABG 1994  /   catheterization 1996, occluded vein graft to the diagonal, other grafts patent  /   catheterization 2006, no PCI  /  nuclear, October, 2011, extensive scar anteroseptal and apical, no ischemia  . Hx of CABG     1994  . Warfarin anticoagulation     Coumadin therapy for mesenteric embolus 2003  . Carotid artery disease ([place])     Doppler, February,  2012, 0-39% bilateral, recommended followup 1 year  . Edema leg     Venous Dopplers 8/13: No DVT bilaterally  . Breast tenderness     Spironolactone was stopped and eplerinone started. Patient feels better  . Thrombocytopenia ([place]) 2008  . Edema 09/02/2013  . Acute bronchitis 09/02/2013  . Iron deficiency anemia   . Thrush   . Colon polyp   . Atrial fibrillation (HCC)     paroxysmal  . Oral thrush 02/05/2015    Past Surgical History  Procedure Laterality Date  .  Coronary artery bypass graft  1994  . Laparoscopic cholecystectomy  2002  . Elap w/ superior mesenteric artery embolectomy  1/03    by Dr. Jennette Banker  . Repair of incarcerated ventral hernia and lysis of adhesions  11/03    by Dr. Betsy Pries  . Implantation of biventric cardioverter-defibrillatorr      by Dr. Lovena Le in 8/06 [employer] [place] 2011  . Implantation of guidant cognis device      model [id number]  . Left inguinal hernia repair with mesh  6/08    by Dr. Betsy Pries  . Prostate surgery  08/2012    by urology in [place]  . Esophagogastroduodenoscopy N/A 04/18/2013    Procedure: ESOPHAGOGASTRODUODENOSCOPY (EGD);  Surgeon: Jerene Bears, MD;  Location: [facility] ENDOSCOPY;  Service:  Endoscopy;  Laterality: N/A;  . Colonoscopy N/A 04/19/2013    Procedure: COLONOSCOPY;  Surgeon: Jerene Bears, MD;  Location[facility] ENDOSCOPY;  Service: Endoscopy;  Laterality: N/A;  . Cataract extraction, bilateral  2014     Current Outpatient Prescriptions  Medication Sig Dispense Refill  . benzonatate (TESSALON) 100 MG capsule Take 1 capsule (100 mg total) by mouth 3 (three) times daily as needed for cough. 20 capsule 0  . carvedilol (COREG) 6.25 MG tablet Take 1 tablet by mouth  twice a day with meals 180 tablet 2  . cholecalciferol (VITAMIN D) 1000 UNITS tablet Take 1,000 Units by mouth daily.     [name] doxycycline (VIBRA-TABS) 100 MG tablet Take 1 tablet (100 mg total) by mouth 2 (two) times daily. 20 tablet 0  . folic acid (FOLVITE) 1 MG tablet TAKE 1 TABLET EVERY DAY    . furosemide (LASIX) 40 MG tablet Take 1 tablet by mouth two  times daily 180 tablet 3  . KLOR-CON M20 20 MEQ tablet TAKE 1 TABLET BY MOUTH DAILY. 30 tablet 3  . lisinopril (PRINIVIL,ZESTRIL) 10 MG tablet Take 1 tablet by mouth  daily 90 tablet 3  . nystatin (MYCOSTATIN) 100000 UNIT/ML suspension Take 5 mLs (500,000 Units total) by mouth 4 (four) times daily. 240 mL 1  . pantoprazole (PROTONIX) 40 MG tablet Take 1 tablet by mouth  every day at 12 noon 90 tablet 3  . simvastatin (ZOCOR) 20 MG tablet Take 1 tablet by mouth at  bedtime 90 tablet 1  . vitamin B-12 (CYANOCOBALAMIN) 1000 MCG tablet Take 1,000 mcg by mouth every other day.    . warfarin (COUMADIN) 5 MG tablet Take 1 tablet (5 mg total) by mouth as directed. 150 tablet 1   No current facility-administered medications for this visit.    Allergies:   Review of patient's allergies indicates no known allergies.    Social History:  The patient  reports that he quit smoking about 42 years ago. His smoking use included Cigarettes. He has a 20 pack-year smoking history. He has never used smokeless tobacco. He reports that he drinks alcohol. He reports that he does not use  illicit drugs.   Family History:  The patient's family history includes Appendicitis in his brother; Cirrhosis in his brother; Dementia in his sister; Diabetes in his son; Edema in his sister; Heart disease in his mother, sister, sister, and sister; Heart disease (age of onset: 38) in his father; Heart failure in his sister; Hypertension in his brother, father, and sister; Kidney disease in his brother; Leukemia in his brother; Myasthenia gravis in his brother; Tremor in his sister.    ROS:  Please see the history of present illness.  Otherwise, review of systems are positive for none.   All other systems are reviewed and negative.   PHYSICAL EXAM: VS:  BP 132/80 mmHg  Pulse 60  Ht 6' (1.829 m)  Wt 206 lb (93.441 kg)  BMI 27.93 kg/m2 , BMI Body mass index is 27.93 kg/(m^2). GEN: Well nourished, well developed, in no acute distress HEENT: normal Neck: no JVD, carotid bruits, or masses Cardiac: RRR; no murmurs, rubs, or gallops,no edema  Respiratory:  clear to auscultation bilaterally, normal work of breathing GI: soft, nontender, nondistended, + BS MS: no deformity or atrophy Skin: warm and dry, no rash Neuro:  Strength and sensation are intact Psych: euthymic mood, full affect  EKG:  EKG is ordered today. The ekg ordered today demonstrates A-sensed BiV paced rhythm  Recent Labs: 02/05/2015: ALT 18; BUN 9; Creatinine, Ser 1.03; Hemoglobin 16.3; Platelets 78.0*; Potassium 3.7; Sodium 140; TSH 3.19   Lipid Panel    Component Value Date/Time   CHOL 166 02/05/2015 0920   TRIG 185.0* 02/05/2015 0920   HDL 49.50 02/05/2015 0920   CHOLHDL 3 02/05/2015 0920   VLDL 37.0 02/05/2015 0920   LDLCALC 80 02/05/2015 0920   LDLDIRECT 81.6 12/30/2013 1213   Wt Readings from Last 3 Encounters:  02/06/15 206 lb (93.441 kg)  02/05/15 205 lb 6 oz (93.157 kg)  01/21/15 207 lb (93.895 kg)      ASSESSMENT AND PLAN:  1. Acute on chronic combined systolic and diastolic CHF - mild fluid overload,  advised to use an extra lasix today and tomorrow and afterwards as needed for LE edema. Elevate his legs, continue compression stockings.   2. Ischemic cardiomyopathy - no symptoms  3. S/P BiV ICD placement - Implantable defibrillator-[employer] The patient's device was last interrogated in 10/2014 and functioning well.  4. Hypertension - well controlled  5. Hyperlipidemia - ok LDL and HDL, mildly elevated triglycerides - diet changes only. On simvaststin 20 mg po daily.  Follow up in 6 months.  Signed, Dorothy Spark, MD  02/06/2015 12:35 PM    [facility] [address], [place], [place]  [address] Phone: [phone]; Fax: [phone]

## 2015-02-13 ENCOUNTER — Telehealth: Payer: Self-pay

## 2015-02-13 NOTE — Telephone Encounter (Signed)
-----   Message from Algernon Huxley, RN sent at 07/03/2014  3:44 PM EDT ----- Regarding: US-Liver Pt needs ABD [place] in December for [facility] screening

## 2015-02-13 NOTE — Telephone Encounter (Signed)
Left message for pt to call back  °

## 2015-02-15 NOTE — Assessment & Plan Note (Signed)
Follows with cardiology, asymptomatic

## 2015-02-15 NOTE — Assessment & Plan Note (Signed)
Well controlled, no changes to meds. Encouraged heart healthy diet such as the DASH diet and exercise as tolerated.  °

## 2015-02-15 NOTE — Assessment & Plan Note (Signed)
Given Nystatin liquid to use

## 2015-02-15 NOTE — Assessment & Plan Note (Signed)
Encouraged increased rest and hydration, add probiotics, zinc such as Coldeze or Xicam. Treat fevers as needed. Given Doxycycline and Mucinex bid

## 2015-02-15 NOTE — Assessment & Plan Note (Signed)
WNL, continue supplements

## 2015-02-15 NOTE — Assessment & Plan Note (Signed)
Tolerating statin, encouraged heart healthy diet, avoid trans fats, minimize simple carbs and saturated fats. Increase exercise as tolerated 

## 2015-02-16 NOTE — Telephone Encounter (Signed)
Have been unable to reach pt by phone. Letter mailed to pt to contact the office regarding [place].

## 2015-02-17 NOTE — Telephone Encounter (Signed)
Unable to reach patient for ICM follow up on 02/04/2015.  ICM rescheduled for 03/05/2015 and patient letter sent with new date.

## 2015-02-18 ENCOUNTER — Ambulatory Visit (INDEPENDENT_AMBULATORY_CARE_PROVIDER_SITE_OTHER): Payer: Medicare Other | Admitting: Pharmacist

## 2015-02-18 DIAGNOSIS — K55069 Acute infarction of intestine, part and extent unspecified: Secondary | ICD-10-CM | POA: Diagnosis not present

## 2015-02-18 LAB — POCT INR: INR: 3.1

## 2015-02-23 ENCOUNTER — Other Ambulatory Visit: Payer: Self-pay

## 2015-02-23 ENCOUNTER — Telehealth: Payer: Self-pay | Admitting: Internal Medicine

## 2015-02-23 DIAGNOSIS — K7460 Unspecified cirrhosis of liver: Secondary | ICD-10-CM

## 2015-02-23 NOTE — Telephone Encounter (Signed)
Pt scheduled for [place] of liver at [facility] 03/18/15@9am , pt to arrive there at 8:45am. Pt to be NPO after midnight. Pt aware of appt.

## 2015-03-05 ENCOUNTER — Telehealth: Payer: Self-pay | Admitting: Cardiology

## 2015-03-05 ENCOUNTER — Ambulatory Visit (INDEPENDENT_AMBULATORY_CARE_PROVIDER_SITE_OTHER): Payer: Medicare Other

## 2015-03-05 DIAGNOSIS — I5022 Chronic systolic (congestive) heart failure: Secondary | ICD-10-CM

## 2015-03-05 DIAGNOSIS — Z95810 Presence of automatic (implantable) cardiac defibrillator: Secondary | ICD-10-CM | POA: Diagnosis not present

## 2015-03-05 NOTE — Telephone Encounter (Signed)
Confirmed remote transmission w/ pt wife.   

## 2015-03-09 DIAGNOSIS — L821 Other seborrheic keratosis: Secondary | ICD-10-CM | POA: Diagnosis not present

## 2015-03-09 DIAGNOSIS — D485 Neoplasm of uncertain behavior of skin: Secondary | ICD-10-CM | POA: Diagnosis not present

## 2015-03-09 DIAGNOSIS — C44629 Squamous cell carcinoma of skin of left upper limb, including shoulder: Secondary | ICD-10-CM | POA: Diagnosis not present

## 2015-03-09 DIAGNOSIS — D1801 Hemangioma of skin and subcutaneous tissue: Secondary | ICD-10-CM | POA: Diagnosis not present

## 2015-03-09 DIAGNOSIS — Z85820 Personal history of malignant melanoma of skin: Secondary | ICD-10-CM | POA: Diagnosis not present

## 2015-03-09 DIAGNOSIS — L570 Actinic keratosis: Secondary | ICD-10-CM | POA: Diagnosis not present

## 2015-03-11 NOTE — Progress Notes (Signed)
EPIC Encounter for ICM Monitoring  Patient Name: [name] is a 82 y.o. male Date: 03/11/2015 Primary Care Physican: Penni Homans, MD Primary Cardiologist: Meda Coffee Electrophysiologist: Caryl Comes Dry Weight: 203 lbs       In the past month, have you:  1. Gained more than 2 pounds in a day or more than 5 pounds in a week? no  2. Had changes in your medications (with verification of current medications)? no  3. Had more shortness of breath than is usual for you? no  4. Limited your activity because of shortness of breath? no  5. Not been able to sleep because of shortness of breath? no  6. Had increased swelling in your feet or ankles? no  7. Had symptoms of dehydration (dizziness, dry mouth, increased thirst, decreased urine output) no  8. Had changes in sodium restriction? no  9. Been compliant with medication? Yes   ICM trend: 02/05/2015 to 03/08/2015      Follow-up plan: ICM clinic phone appointment on 04/13/2015.  Latitude respiratory rate median 20-22 in the last month, Activity Level (hours) 0.9 - 2.3 in the last month.  Appears to be stable.  Patient reported he is doing well and denied any fluid retention symptoms.  Encouraged him to call for any fluid symptoms.  No changes today.     Copy of note sent to patient's primary care physician, primary cardiologist, and device following physician.  Rosalene Billings, RN, CCM 03/11/2015 9:04 AM

## 2015-03-12 ENCOUNTER — Other Ambulatory Visit: Payer: Self-pay | Admitting: Cardiology

## 2015-03-18 ENCOUNTER — Ambulatory Visit (HOSPITAL_COMMUNITY)
Admission: RE | Admit: 2015-03-18 | Discharge: 2015-03-18 | Disposition: A | Discharge status: Home / Self Care | Payer: Medicare Other | Source: Ambulatory Visit | Attending: Internal Medicine | Admitting: Internal Medicine

## 2015-03-18 ENCOUNTER — Ambulatory Visit (INDEPENDENT_AMBULATORY_CARE_PROVIDER_SITE_OTHER): Payer: Medicare Other | Admitting: *Deleted

## 2015-03-18 ENCOUNTER — Other Ambulatory Visit: Payer: Self-pay | Admitting: Internal Medicine

## 2015-03-18 DIAGNOSIS — K7460 Unspecified cirrhosis of liver: Secondary | ICD-10-CM | POA: Insufficient documentation

## 2015-03-18 DIAGNOSIS — K55069 Acute infarction of intestine, part and extent unspecified: Secondary | ICD-10-CM | POA: Diagnosis not present

## 2015-03-18 DIAGNOSIS — R161 Splenomegaly, not elsewhere classified: Secondary | ICD-10-CM | POA: Diagnosis not present

## 2015-03-18 LAB — POCT INR: INR: 2.4

## 2015-03-19 ENCOUNTER — Other Ambulatory Visit: Payer: Self-pay | Admitting: Cardiology

## 2015-03-25 ENCOUNTER — Telehealth: Payer: Self-pay | Admitting: Internal Medicine

## 2015-03-25 NOTE — Telephone Encounter (Signed)
Wife calling for [place] results. Please advise.

## 2015-03-26 NOTE — Telephone Encounter (Signed)
See result note.  

## 2015-04-10 ENCOUNTER — Other Ambulatory Visit: Payer: Self-pay | Admitting: *Deleted

## 2015-04-10 MED ORDER — POTASSIUM CHLORIDE CRYS ER 20 MEQ PO TBCR: 20.0000 meq | EXTENDED_RELEASE_TABLET | Freq: Every day | ORAL | Status: DC

## 2015-04-13 ENCOUNTER — Ambulatory Visit (INDEPENDENT_AMBULATORY_CARE_PROVIDER_SITE_OTHER): Payer: Medicare Other | Admitting: *Deleted

## 2015-04-13 DIAGNOSIS — Z95810 Presence of automatic (implantable) cardiac defibrillator: Secondary | ICD-10-CM

## 2015-04-13 DIAGNOSIS — I255 Ischemic cardiomyopathy: Secondary | ICD-10-CM

## 2015-04-13 NOTE — Progress Notes (Signed)
Remote ICD transmission.   

## 2015-04-14 NOTE — Progress Notes (Signed)
EPIC Encounter for ICM Monitoring  Patient Name: [name] is a 82 y.o. male Date: 04/14/2015 Primary Care Physican: Penni Homans, MD Primary Cardiologist: Meda Coffee Electrophysiologist: Caryl Comes Dry Weight: 204 lb   CRT Pacing 97%      In the past month, have you:  1. Gained more than 2 pounds in a day or more than 5 pounds in a week? no  2. Had changes in your medications (with verification of current medications)? no  3. Had more shortness of breath than is usual for you? no  4. Limited your activity because of shortness of breath? no  5. Not been able to sleep because of shortness of breath? no  6. Had increased swelling in your feet or ankles? no  7. Had symptoms of dehydration (dizziness, dry mouth, increased thirst, decreased urine output) no  8. Had changes in sodium restriction? no  9. Been compliant with medication? Yes   ICM trend: 3 month view for 04/13/2015   ICM trend: 1 year view for 04/13/2015   Follow-up plan: ICM clinic phone appointment 06/08/2015 and appointment with [name] Standard, NP on 05/08/2015 for office defib check.  Latitude respiratory rate median is 20-22 in last month, Activity level (hours) 1.7 to 2.7 in last month.  He reported a 6 pound weight gain in last month with slight leg swelling.  Weight has returned back to his baseline and no leg swelling today.  Education given to limit sodium intake to fluid intake to 64 oz daily and he stated he does not think he drinks that much.  Encouraged to call for any fluid symptoms.  No changes today.    Rosalene Billings, RN, CCM 04/14/2015 9:45 AM

## 2015-04-15 ENCOUNTER — Ambulatory Visit (INDEPENDENT_AMBULATORY_CARE_PROVIDER_SITE_OTHER): Payer: Medicare Other | Admitting: Surgery

## 2015-04-15 DIAGNOSIS — K55069 Acute infarction of intestine, part and extent unspecified: Secondary | ICD-10-CM

## 2015-04-15 LAB — POCT INR: INR: 2.9

## 2015-04-16 ENCOUNTER — Encounter: Payer: Self-pay | Admitting: Physician Assistant

## 2015-04-20 DIAGNOSIS — Z85828 Personal history of other malignant neoplasm of skin: Secondary | ICD-10-CM | POA: Diagnosis not present

## 2015-05-08 ENCOUNTER — Encounter: Payer: Self-pay | Admitting: Physician Assistant

## 2015-05-08 ENCOUNTER — Ambulatory Visit (INDEPENDENT_AMBULATORY_CARE_PROVIDER_SITE_OTHER): Payer: Medicare Other | Admitting: Physician Assistant

## 2015-05-08 VITALS — BP 134/76 | HR 60 | Ht 72.0 in | Wt 204.0 lb

## 2015-05-08 DIAGNOSIS — I2510 Atherosclerotic heart disease of native coronary artery without angina pectoris: Secondary | ICD-10-CM | POA: Diagnosis not present

## 2015-05-08 DIAGNOSIS — I509 Heart failure, unspecified: Secondary | ICD-10-CM

## 2015-05-08 DIAGNOSIS — I480 Paroxysmal atrial fibrillation: Secondary | ICD-10-CM | POA: Diagnosis not present

## 2015-05-08 DIAGNOSIS — I255 Ischemic cardiomyopathy: Secondary | ICD-10-CM | POA: Diagnosis not present

## 2015-05-08 DIAGNOSIS — I10 Essential (primary) hypertension: Secondary | ICD-10-CM | POA: Diagnosis not present

## 2015-05-08 NOTE — Progress Notes (Signed)
Cardiology Office Note Date:  05/08/2015  Patient ID:  [name], [name] 13-Nov-1931, MRN [id number] PCP:  Penni Homans, MD  Cardiologist:  Dr. Meda Coffee Electrophysiologist: Dr. Caryl Comes   Chief Complaint:  Routine, scheduled visit   History of Present Illness: [name] is a 82 y.o. male with history of CAD, ICM/ICD, HTN, hyperlipidemia, PAF comes to the office today seen for Dr. Caryl Comes  He denies any kind of CP, he reports doing his routine daily activities without difficulty, no palpitations, no shocks from his device, no near syncope or syncope. He again feels like he sleeps OK, his prior c/o sinus congestion is improved, he denies orthopnea or PND symptoms.  He denies any issues with his coumadin, no bleeding or signs of bleeding, monitoring about q2-3weeks with the coumadin clinic.  He reports having a "terrible stomach bug" that lasted 2-3 days a few weeks ago, otherwise doing well since his last visit.   Past Medical History  Diagnosis Date  . Dizziness     Positional dizziness  . Hypertension   . Ischemic cardiomyopathy     CABG 1994 CAD catherization 1996.[name] occluded vein graft to the diagnol, other grafts patent/  catherization 2006, no PCI/ nuclear.[name] october 2011.[name] extensive scar anteroseptal and apical.. no ischemia    . CHF (congestive heart failure) (HCC)     chronic systolic  . Mitral regurgitation     mild.[name] echo november 2011  EF 35%...echo.. november 2009/ ef 35%.[name] echo november 2011  . Sinus bradycardia   . Chronotropic incompetence   . Biventricular implantable cardiac defibrillator in situ     GDT CONTAK H170-MADIT-CRT-EXPLANTED 2011 implanted defibrillator- [employer]ant cognis, model n119-2001 Dr. Caryl Comes (11/28/2009)  . LBBB (left bundle branch block)   . Hyperlipemia   . GERD (gastroesophageal reflux disease)   . Colonic polyp 2010    pathology not clear.   . Crohn's disease ([place])   . Acute vascular insufficiency of intestine (HCC)     Mesenteric  embolus...surgery 2003  . Ventral hernia     multiple, wears an abd binder, reports he hs been told he was at increased riisk for surgery  . Hypertrophy of prostate with urinary obstruction and other lower urinary tract symptoms (LUTS)   . Degenerative joint disease   . Polymyalgia rheumatica ([place])   . Tremor   . Anxiety   . Shingles   . Vitamin B12 deficiency   . CAD (coronary artery disease)     CABG 1994  /   catheterization 1996, occluded vein graft to the diagonal, other grafts patent  /   catheterization 2006, no PCI  /  nuclear, October, 2011, extensive scar anteroseptal and apical, no ischemia  . Hx of CABG     1994  . Warfarin anticoagulation     Coumadin therapy for mesenteric embolus 2003  . Carotid artery disease ([place])     Doppler, February,  2012, 0-39% bilateral, recommended followup 1 year  . Edema leg     Venous Dopplers 8/13: No DVT bilaterally  . Breast tenderness     Spironolactone was stopped and eplerinone started. Patient feels better  . Thrombocytopenia ([place]) 2008  . Edema 09/02/2013  . Acute bronchitis 09/02/2013  . Iron deficiency anemia   . Thrush   . Colon polyp   . Atrial fibrillation (HCC)     paroxysmal  . Oral thrush 02/05/2015    Past Surgical History  Procedure Laterality Date  . Coronary artery bypass graft  1994  . Laparoscopic cholecystectomy  2002  . Elap w/ superior mesenteric artery embolectomy  1/03    by Dr. Jennette Banker  . Repair of incarcerated ventral hernia and lysis of adhesions  11/03    by Dr. Betsy Pries  . Implantation of biventric cardioverter-defibrillatorr      by Dr. Lovena Le in 8/06 [employer] [place] 2011  . Implantation of guidant cognis device      model [id number]  . Left inguinal hernia repair with mesh  6/08    by Dr. Betsy Pries  . Prostate surgery  08/2012    by urology in [place]  . Esophagogastroduodenoscopy N/A 04/18/2013    Procedure: ESOPHAGOGASTRODUODENOSCOPY (EGD);  Surgeon: Jerene Bears, MD;  Location[facility]  ENDOSCOPY;  Service: Endoscopy;  Laterality: N/A;  . Colonoscopy N/A 04/19/2013    Procedure: COLONOSCOPY;  Surgeon: Jerene Bears, MD;  Location[facility] ENDOSCOPY;  Service: Endoscopy;  Laterality: N/A;  . Cataract extraction, bilateral  2014    Current Outpatient Prescriptions  Medication Sig Dispense Refill  . benzonatate (TESSALON) 100 MG capsule Take 1 capsule (100 mg total) by mouth 3 (three) times daily as needed for cough. 20 capsule 0  . carvedilol (COREG) 6.25 MG tablet Take 1 tablet by mouth  twice a day with meals 180 tablet 2  . cholecalciferol (VITAMIN D) 1000 UNITS tablet Take 1,000 Units by mouth daily.     [name] doxycycline (VIBRA-TABS) 100 MG tablet Take 1 tablet (100 mg total) by mouth 2 (two) times daily. 20 tablet 0  . folic acid (FOLVITE) 1 MG tablet TAKE 1 TABLET EVERY DAY    . furosemide (LASIX) 40 MG tablet Take 1 tablet by mouth two  times daily 180 tablet 3  . lisinopril (PRINIVIL,ZESTRIL) 10 MG tablet Take 1 tablet by mouth  daily 90 tablet 3  . nystatin (MYCOSTATIN) 100000 UNIT/ML suspension Take 5 mLs (500,000 Units total) by mouth 4 (four) times daily. 240 mL 1  . pantoprazole (PROTONIX) 40 MG tablet Take 1 tablet by mouth  every day at 12 noon 90 tablet 3  . potassium chloride SA (KLOR-CON M20) 20 MEQ tablet Take 1 tablet (20 mEq total) by mouth daily. 90 tablet 3  . simvastatin (ZOCOR) 20 MG tablet Take 1 tablet by mouth at  bedtime 90 tablet 3  . vitamin B-12 (CYANOCOBALAMIN) 1000 MCG tablet Take 1,000 mcg by mouth every other day.    . warfarin (COUMADIN) 5 MG tablet Take as directed by  anticoagulation clinic 110 tablet 0   No current facility-administered medications for this visit.    Allergies:   Review of patient's allergies indicates no known allergies.   Social History:  The patient  reports that he quit smoking about 42 years ago. His smoking use included Cigarettes. He has a 20 pack-year smoking history. He has never used smokeless tobacco. He reports that  he drinks alcohol. He reports that he does not use illicit drugs.   Family History:  The patient's family history includes Appendicitis in his brother; Cirrhosis in his brother; Dementia in his sister; Diabetes in his son; Edema in his sister; Heart disease in his mother, sister, sister, and sister; Heart disease (age of onset: 41) in his father; Heart failure in his sister; Hypertension in his brother, father, and sister; Kidney disease in his brother; Leukemia in his brother; Myasthenia gravis in his brother; Tremor in his sister.  ROS:  Please see the history of present illness.  All other systems are reviewed  and otherwise negative.   PHYSICAL EXAM:  VS:  There were no vitals taken for this visit. BMI: There is no weight on file to calculate BMI. Well nourished, well developed, in no acute distress HEENT: normocephalic, atraumatic Neck: no JVD, carotid bruits or masses Cardiac:  normal S1, S2; RRR; no significant murmurs, no rubs, or gallops Lungs:  clear to auscultation bilaterally, no wheezing, rhonchi or rales Abd: abdominal binder in place MS: no deformity or atrophy Ext: no edema Skin: warm and dry, no rash Neuro:  No gross deficits appreciated Psych: euthymic mood, full affect  ICD site is stable, no tethering or discomfort   EKG:  Done today and reviewed by myself is SR V pacing Device interrogation today : done by industry rep,  He has had PAF episodes, there are 2 NSVT episodes that are secondary to noise on the leads, they are very short and on both leads.  Lead testing, device testing show normal/stable values, battery status is good.  AFib burden remains low  Recent Labs: 12/02/14: INR 2.9 (managed with the coumadin clinic)  02/05/2015: ALT 18; BUN 9; Creatinine, Ser 1.03; Hemoglobin 16.3; Platelets 78.0*; Potassium 3.7; Sodium 140; TSH 3.19  02/05/2015: Cholesterol 166; HDL 49.50; LDL Cholesterol 80; Total CHOL/HDL Ratio 3; Triglycerides 185.0*; VLDL 37.0     Wt Readings  from Last 3 Encounters:  02/06/15 206 lb (93.441 kg)  02/05/15 205 lb 6 oz (93.157 kg)  01/21/15 207 lb (93.895 kg)     Other studies reviewed: 11/03/14: Echo for optimizatioin Study Conclusions - Left ventricle: Hypokinesis of the basal and mid inferolateral walls. The cavity size was mildly dilated. There was moderate concentric hypertrophy. Systolic function was moderately reduced. The estimated ejection fraction was in the range of 35% to 40%. Wall motion was normal; there were no regional wall motion abnormalities. Doppler parameters are consistent with abnormal left ventricular relaxation (grade 1 diastolic dysfunction). - Aortic root: The aortic root was normal in size. - Left atrium: The atrium was moderately dilated. - Right ventricle: Systolic function was normal. - Right atrium: Pacer wire or catheter noted in right atrium. - Inferior vena cava: The vessel was not visualized. - Pericardium, extracardiac: There was no pericardial effusion. Impressions: - A limited study without use of Doppler. LVEF 35-40%.  09/23/11 Echocardiogram Study Conclusions - Left ventricle: The cavity size was normal. Wall thickness was increased in a pattern of mild LVH. Systolic function was moderately reduced. The estimated ejection fraction was in the range of 35% to 40%. Hypokinesis of the entire myocardium. Doppler parameters are consistent with abnormal left ventricular relaxation (grade 1 diastolic dysfunction). - Aortic valve: Trivial regurgitation. - Mitral valve: Mild regurgitation. - Left atrium: The atrium was mildly dilated. - Atrial septum: No defect or patent foramen ovale was identified.  Device: [employer] CRT ICD implanted 2011  ASSESSMENT AND PLAN:  1. ICM, chronic systolic CHF       euvolemic by exam, weight is stable/down a couple pounds       On BB/ACE, diuretic       Labs look OK       [place] q46mo will start ICM clinic  calls with LSharman Cheek 2. HTN     Looks well controlled  3. CAD     No angina, c/w Dr. NMeda Coffee     On BB, statin, no ASA with warfarin  4. 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  Will have the device clinic schedule him for a remote check in 1 month to evaluate for any further evidence of external interference or lead noise. Will see him back in 6 months, sooner if needed.  Current medicines are reviewed at length with the patient today.  The patient did not have any concerns regarding medicines.  Haywood Lasso, PA-C 05/08/2015 5:30 AM     [address] [place] [address] [phone] (office)  [phone] (fax)

## 2015-05-08 NOTE — Patient Instructions (Addendum)
Medication Instructions:   Your physician recommends that you continue on your current medications as directed. Please refer to the Current Medication list given to you today.\  If you need a refill on your cardiac medications before your next appointment, please call your pharmacy.  Labwork: NONE ORDER TODAY    Testing/Procedures:  NONE ORDER TODAY    Follow-Up:  WITH DR Caryl Comes IN 6 MONTHS   Remote monitoring is used to monitor your Pacemaker of ICD from home. This monitoring reduces the number of office visits required to check your device to one time per year. It allows [place] to keep an eye on the functioning of your device to ensure it is working properly. You are scheduled for a device check from home on .  08/07/15..You may send your transmission at any time that day. If you have a wireless device, the transmission will be sent automatically. After your physician reviews your transmission, you will receive a postcard with your next transmission date.     Any Other Special Instructions Will Be Listed Below (If Applicable).

## 2015-05-11 ENCOUNTER — Telehealth: Payer: Self-pay | Admitting: *Deleted

## 2015-05-11 NOTE — Telephone Encounter (Signed)
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  Advised that per Tommye Standard, PA, we will check a remote transmission from patient's home monitor in one month to ensure that patient is not having frequent noise reversions.  Patient's wife is agreeable to this plan and denies additional questions or concerns at this time.

## 2015-05-14 LAB — CUP PACEART REMOTE DEVICE CHECK
Battery Remaining Percentage: 91 %
HighPow Impedance: 63 Ohm
Implantable Lead Implant Date: 20060808
Implantable Lead Location: 753860
Implantable Lead Model: 158
Implantable Lead Serial Number: 165392
Lead Channel Impedance Value: 616 Ohm
Lead Channel Setting Pacing Amplitude: 2 V
Lead Channel Setting Pacing Amplitude: 2.3 V
Lead Channel Setting Pacing Pulse Width: 0.4 ms
Lead Channel Setting Sensing Sensitivity: 1 mV
MDC IDC LEAD IMPLANT DT: 20060808
MDC IDC LEAD IMPLANT DT: 20060911
MDC IDC LEAD LOCATION: 753858
MDC IDC LEAD LOCATION: 753859
MDC IDC LEAD MODEL: 4087
MDC IDC LEAD SERIAL: 261368
MDC IDC MSMT BATTERY REMAINING LONGEVITY: 60 mo
MDC IDC MSMT LEADCHNL LV IMPEDANCE VALUE: 461 Ohm
MDC IDC MSMT LEADCHNL RA IMPEDANCE VALUE: 482 Ohm
MDC IDC PG SERIAL: 490477
MDC IDC SESS DTM: 20170313054100
MDC IDC SET LEADCHNL LV PACING PULSEWIDTH: 0.8 ms
MDC IDC SET LEADCHNL RV PACING AMPLITUDE: 2.4 V
MDC IDC SET LEADCHNL RV SENSING SENSITIVITY: 0.6 mV
MDC IDC STAT BRADY RA PERCENT PACED: 81 %
MDC IDC STAT BRADY RV PERCENT PACED: 96 %

## 2015-05-19 ENCOUNTER — Encounter: Payer: Self-pay | Admitting: Cardiology

## 2015-05-25 DIAGNOSIS — N401 Enlarged prostate with lower urinary tract symptoms: Secondary | ICD-10-CM | POA: Diagnosis not present

## 2015-05-25 DIAGNOSIS — N3020 Other chronic cystitis without hematuria: Secondary | ICD-10-CM | POA: Diagnosis not present

## 2015-05-25 DIAGNOSIS — N5203 Combined arterial insufficiency and corporo-venous occlusive erectile dysfunction: Secondary | ICD-10-CM | POA: Diagnosis not present

## 2015-05-25 DIAGNOSIS — D4110 Neoplasm of uncertain behavior of unspecified renal pelvis: Secondary | ICD-10-CM | POA: Diagnosis not present

## 2015-05-25 DIAGNOSIS — N318 Other neuromuscular dysfunction of bladder: Secondary | ICD-10-CM | POA: Diagnosis not present

## 2015-05-27 ENCOUNTER — Ambulatory Visit (INDEPENDENT_AMBULATORY_CARE_PROVIDER_SITE_OTHER): Payer: Medicare Other | Admitting: Surgery

## 2015-05-27 DIAGNOSIS — K55069 Acute infarction of intestine, part and extent unspecified: Secondary | ICD-10-CM | POA: Diagnosis not present

## 2015-05-27 LAB — POCT INR: INR: 3.7

## 2015-05-29 ENCOUNTER — Other Ambulatory Visit: Payer: Self-pay | Admitting: Cardiology

## 2015-06-05 ENCOUNTER — Ambulatory Visit (INDEPENDENT_AMBULATORY_CARE_PROVIDER_SITE_OTHER): Payer: Medicare Other | Admitting: Family Medicine

## 2015-06-05 ENCOUNTER — Encounter: Payer: Self-pay | Admitting: Family Medicine

## 2015-06-05 VITALS — BP 118/78 | HR 65 | Temp 97.7°F | Ht 72.0 in | Wt 204.2 lb

## 2015-06-05 DIAGNOSIS — K219 Gastro-esophageal reflux disease without esophagitis: Secondary | ICD-10-CM | POA: Diagnosis not present

## 2015-06-05 DIAGNOSIS — E785 Hyperlipidemia, unspecified: Secondary | ICD-10-CM

## 2015-06-05 DIAGNOSIS — I255 Ischemic cardiomyopathy: Secondary | ICD-10-CM

## 2015-06-05 DIAGNOSIS — I10 Essential (primary) hypertension: Secondary | ICD-10-CM | POA: Diagnosis not present

## 2015-06-05 DIAGNOSIS — D696 Thrombocytopenia, unspecified: Secondary | ICD-10-CM | POA: Diagnosis not present

## 2015-06-05 DIAGNOSIS — Z23 Encounter for immunization: Secondary | ICD-10-CM

## 2015-06-05 DIAGNOSIS — E559 Vitamin D deficiency, unspecified: Secondary | ICD-10-CM | POA: Diagnosis not present

## 2015-06-05 DIAGNOSIS — D62 Acute posthemorrhagic anemia: Secondary | ICD-10-CM | POA: Diagnosis not present

## 2015-06-05 DIAGNOSIS — E538 Deficiency of other specified B group vitamins: Secondary | ICD-10-CM

## 2015-06-05 LAB — COMPREHENSIVE METABOLIC PANEL
ALT: 20 U/L (ref 0–53)
AST: 34 U/L (ref 0–37)
Albumin: 3.8 g/dL (ref 3.5–5.2)
Alkaline Phosphatase: 76 U/L (ref 39–117)
BILIRUBIN TOTAL: 3.4 mg/dL — AB (ref 0.2–1.2)
BUN: 12 mg/dL (ref 6–23)
CALCIUM: 9.2 mg/dL (ref 8.4–10.5)
CO2: 29 meq/L (ref 19–32)
CREATININE: 1.03 mg/dL (ref 0.40–1.50)
Chloride: 103 mEq/L (ref 96–112)
GFR: 73.48 mL/min (ref >60.00)
GLUCOSE: 109 mg/dL — AB (ref 70–99)
Potassium: 3.8 mEq/L (ref 3.5–5.1)
SODIUM: 139 meq/L (ref 135–145)
Total Protein: 5.8 g/dL — ABNORMAL LOW (ref 6.0–8.3)

## 2015-06-05 LAB — PROTIME-INR
INR: 2.1 ratio — AB (ref 0.8–1.0)
Prothrombin Time: 22.8 s — ABNORMAL HIGH (ref 9.6–13.1)

## 2015-06-05 LAB — LIPID PANEL
CHOL/HDL RATIO: 3
CHOLESTEROL: 122 mg/dL (ref 0–200)
HDL: 42.8 mg/dL (ref >39.00)
LDL Cholesterol: 53 mg/dL (ref 0–99)
NonHDL: 78.77
Triglycerides: 127 mg/dL (ref 0.0–149.0)
VLDL: 25.4 mg/dL (ref 0.0–40.0)

## 2015-06-05 LAB — CBC
HCT: 42.5 % (ref 39.0–52.0)
Hemoglobin: 14.4 g/dL (ref 13.0–17.0)
MCHC: 33.9 g/dL (ref 30.0–36.0)
MCV: 89.4 fl (ref 78.0–100.0)
Platelets: 59 10*3/uL — ABNORMAL LOW (ref 150.0–400.0)
RBC: 4.76 Mil/uL (ref 4.22–5.81)
RDW: 14.7 % (ref 11.5–15.5)
WBC: 4.1 10*3/uL (ref 4.0–10.5)

## 2015-06-05 LAB — VITAMIN B12: Vitamin B-12: 951 pg/mL — ABNORMAL HIGH (ref 211–911)

## 2015-06-05 LAB — TSH: TSH: 1.92 u[IU]/mL (ref 0.35–4.50)

## 2015-06-05 MED ORDER — ZOSTER VACCINE LIVE 19400 UNT/0.65ML ~~LOC~~ SUSR: 0.6500 mL | Freq: Once | SUBCUTANEOUS | Status: DC

## 2015-06-05 NOTE — Progress Notes (Signed)
Pre visit review using our clinic review tool, if applicable. No additional management support is needed unless otherwise documented below in the visit note. 

## 2015-06-05 NOTE — Patient Instructions (Signed)
Hypertension Hypertension, commonly called high blood pressure, is when the force of blood pumping through your arteries is too strong. Your arteries are the blood vessels that carry blood from your heart throughout your body. A blood pressure reading consists of a higher number over a lower number, such as 110/72. The higher number (systolic) is the pressure inside your arteries when your heart pumps. The lower number (diastolic) is the pressure inside your arteries when your heart relaxes. Ideally you want your blood pressure below 120/80. Hypertension forces your heart to work harder to pump blood. Your arteries may become narrow or stiff. Having untreated or uncontrolled hypertension can cause heart attack, stroke, kidney disease, and other problems. RISK FACTORS Some risk factors for high blood pressure are controllable. Others are not.  Risk factors you cannot control include:   Race. You may be at higher risk if you are African American.  Age. Risk increases with age.  Gender. Men are at higher risk than women before age 45 years. After age 65, women are at higher risk than men. Risk factors you can control include:  Not getting enough exercise or physical activity.  Being overweight.  Getting too much fat, sugar, calories, or salt in your diet.  Drinking too much alcohol. SIGNS AND SYMPTOMS Hypertension does not usually cause signs or symptoms. Extremely high blood pressure (hypertensive crisis) may cause headache, anxiety, shortness of breath, and nosebleed. DIAGNOSIS To check if you have hypertension, your health care provider will measure your blood pressure while you are seated, with your arm held at the level of your heart. It should be measured at least twice using the same arm. Certain conditions can cause a difference in blood pressure between your right and left arms. A blood pressure reading that is higher than normal on one occasion does not mean that you need treatment. If  it is not clear whether you have high blood pressure, you may be asked to return on a different day to have your blood pressure checked again. Or, you may be asked to monitor your blood pressure at home for 1 or more weeks. TREATMENT Treating high blood pressure includes making lifestyle changes and possibly taking medicine. Living a healthy lifestyle can help lower high blood pressure. You may need to change some of your habits. Lifestyle changes may include:  Following the DASH diet. This diet is high in fruits, vegetables, and whole grains. It is low in salt, red meat, and added sugars.  Keep your sodium intake below 2,300 mg per day.  Getting at least 30-45 minutes of aerobic exercise at least 4 times per week.  Losing weight if necessary.  Not smoking.  Limiting alcoholic beverages.  Learning ways to reduce stress. Your health care provider may prescribe medicine if lifestyle changes are not enough to get your blood pressure under control, and if one of the following is true:  You are 18-59 years of age and your systolic blood pressure is above 140.  You are 60 years of age or older, and your systolic blood pressure is above 150.  Your diastolic blood pressure is above 90.  You have diabetes, and your systolic blood pressure is over 140 or your diastolic blood pressure is over 90.  You have kidney disease and your blood pressure is above 140/90.  You have heart disease and your blood pressure is above 140/90. Your personal target blood pressure may vary depending on your medical conditions, your age, and other factors. HOME CARE INSTRUCTIONS    Have your blood pressure rechecked as directed by your health care provider.   Take medicines only as directed by your health care provider. Follow the directions carefully. Blood pressure medicines must be taken as prescribed. The medicine does not work as well when you skip doses. Skipping doses also puts you at risk for  problems.  Do not smoke.   Monitor your blood pressure at home as directed by your health care provider. SEEK MEDICAL CARE IF:   You think you are having a reaction to medicines taken.  You have recurrent headaches or feel dizzy.  You have swelling in your ankles.  You have trouble with your vision. SEEK IMMEDIATE MEDICAL CARE IF:  You develop a severe headache or confusion.  You have unusual weakness, numbness, or feel faint.  You have severe chest or abdominal pain.  You vomit repeatedly.  You have trouble breathing. MAKE SURE YOU:   Understand these instructions.  Will watch your condition.  Will get help right away if you are not doing well or get worse.   This information is not intended to replace advice given to you by your health care provider. Make sure you discuss any questions you have with your health care provider.   Document Released: 01/17/2005 Document Revised: 06/03/2014 Document Reviewed: 11/09/2012 Elsevier Interactive Patient Education 2016 Elsevier Inc.  

## 2015-06-05 NOTE — Assessment & Plan Note (Signed)
Asymptomatic, recheck cbc today

## 2015-06-05 NOTE — Assessment & Plan Note (Signed)
Following closely with cardiology at this point and is doing well

## 2015-06-05 NOTE — Assessment & Plan Note (Signed)
Well controlled, no changes to meds. Encouraged heart healthy diet such as the DASH diet and exercise as tolerated.  °

## 2015-06-05 NOTE — Assessment & Plan Note (Signed)
Avoid offending foods, start probiotics. Do not eat large meals in late evening and consider raising head of bed.  

## 2015-06-05 NOTE — Progress Notes (Signed)
Subjective:    Patient ID: [name], male    DOB: February 15, 1931, 82 y.o.   MRN: [id number]  Chief Complaint  Patient presents with  . Follow-up    HPI Patient is in today for follow up.  Patient is active does some gardening.  Patient has been doing well had past sinus issues but no new concerns. Denies CP/palp/SOB/HA/congestion/fevers/GI or GU c/o. Taking meds as prescribed.  Past Medical History  Diagnosis Date  . Dizziness     Positional dizziness  . Hypertension   . Ischemic cardiomyopathy     CABG 1994 CAD catherization 1996.[name] occluded vein graft to the diagnol, other grafts patent/  catherization 2006, no PCI/ nuclear.[name] october 2011.[name] extensive scar anteroseptal and apical.. no ischemia    . CHF (congestive heart failure) (HCC)     chronic systolic  . Mitral regurgitation     mild.[name] echo november 2011  EF 35%...echo.. november 2009/ ef 35%.[name] echo november 2011  . Sinus bradycardia   . Chronotropic incompetence   . Biventricular implantable cardiac defibrillator in situ     GDT CONTAK H170-MADIT-CRT-EXPLANTED 2011 implanted defibrillator- Guidant cognis, model n119-2001 Dr. Caryl Comes (11/28/2009)  . LBBB (left bundle branch block)   . Hyperlipemia   . GERD (gastroesophageal reflux disease)   . Colonic polyp 2010    pathology not clear.   . Crohn's disease ([place])   . Acute vascular insufficiency of intestine (HCC)     Mesenteric embolus...surgery 2003  . Ventral hernia     multiple, wears an abd binder, reports he hs been told he was at increased riisk for surgery  . Hypertrophy of prostate with urinary obstruction and other lower urinary tract symptoms (LUTS)   . Degenerative joint disease   . Polymyalgia rheumatica ([place])   . Tremor   . Anxiety   . Shingles   . Vitamin B12 deficiency   . CAD (coronary artery disease)     CABG 1994  /   catheterization 1996, occluded vein graft to the diagonal, other grafts patent  /   catheterization 2006, no PCI  /  nuclear,  October, 2011, extensive scar anteroseptal and apical, no ischemia  . Hx of CABG     1994  . Warfarin anticoagulation     Coumadin therapy for mesenteric embolus 2003  . Carotid artery disease ([place])     Doppler, February,  2012, 0-39% bilateral, recommended followup 1 year  . Edema leg     Venous Dopplers 8/13: No DVT bilaterally  . Breast tenderness     Spironolactone was stopped and eplerinone started. Patient feels better  . Thrombocytopenia ([place]) 2008  . Edema 09/02/2013  . Acute bronchitis 09/02/2013  . Iron deficiency anemia   . Thrush   . Colon polyp   . Atrial fibrillation (HCC)     paroxysmal  . Oral thrush 02/05/2015  . Thrombocytopenia ([place]) 11/30/2011    Past Surgical History  Procedure Laterality Date  . Coronary artery bypass graft  1994  . Laparoscopic cholecystectomy  2002  . Elap w/ superior mesenteric artery embolectomy  1/03    by Dr. Jennette Banker  . Repair of incarcerated ventral hernia and lysis of adhesions  11/03    by Dr. Betsy Pries  . Implantation of biventric cardioverter-defibrillatorr      by Dr. Lovena Le in 8/06 Guidant [place] 2011  . Implantation of guidant cognis device      model [id number]  . Left inguinal hernia repair with mesh  6/08    by Dr. Betsy Pries  . Prostate surgery  08/2012    by urology in [place]  . Esophagogastroduodenoscopy N/A 04/18/2013    Procedure: ESOPHAGOGASTRODUODENOSCOPY (EGD);  Surgeon: Jerene Bears, MD;  Location: [facility] ENDOSCOPY;  Service: Endoscopy;  Laterality: N/A;  . Colonoscopy N/A 04/19/2013    Procedure: COLONOSCOPY;  Surgeon: Jerene Bears, MD;  Location: [facility] ENDOSCOPY;  Service: Endoscopy;  Laterality: N/A;  . Cataract extraction, bilateral  2014    Family History  Problem Relation Age of Onset  . Heart disease Mother   . Hypertension    . Hyperlipidemia    . Tremor Sister   . Heart failure Sister   . Heart disease Father 19  . Hypertension Father   . Appendicitis Brother     ruptured  . Diabetes Son   .  Heart disease Sister   . Hypertension Sister   . Dementia Sister   . Heart disease Sister   . Hypertension Brother   . Kidney disease Brother   . Myasthenia gravis Brother   . Cirrhosis Brother   . Leukemia Brother   . Heart disease Sister   . Edema Sister     pedal edema    Social History   Social History  . Marital Status: Married    Spouse Name: Kennyth Lose  . Number of Children: 3  . 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 Smoking status: Former Smoker -- 1.00 packs/day for 20 years    Types: Cigarettes    Quit date: 01/31/1973  . Smokeless tobacco: Never Used  . Alcohol Use: 0.0 oz/week    0 Standard drinks or equivalent per week     Comment: occasional  . Drug Use: No  . Sexual Activity: No     Comment: lives with wife, no dietary restrictions   Other Topics Concern  . Not on file   Social History Narrative   Lives in [place], [place] with wife.     Outpatient Prescriptions Prior to Visit  Medication Sig Dispense Refill  . carvedilol (COREG) 6.25 MG tablet Take 1 tablet by mouth  twice a day with meals 180 tablet 2  . cholecalciferol (VITAMIN D) 1000 UNITS tablet Take 1,000 Units by mouth daily.     . folic acid (FOLVITE) 1 MG tablet TAKE 1 TABLET EVERY DAY    . furosemide (LASIX) 40 MG tablet Take 1 tablet by mouth two  times daily 180 tablet 1  . lisinopril (PRINIVIL,ZESTRIL) 10 MG tablet Take 1 tablet by mouth  daily 90 tablet 1  . nystatin (MYCOSTATIN) 100000 UNIT/ML suspension Take 5 mLs (500,000 Units total) by mouth 4 (four) times daily. 240 mL 1  . pantoprazole (PROTONIX) 40 MG tablet Take 1 tablet by mouth  every day at 12 noon 90 tablet 3  . potassium chloride SA (KLOR-CON M20) 20 MEQ tablet Take 1 tablet (20 mEq total) by mouth daily. 90 tablet 3  . simvastatin (ZOCOR) 20 MG tablet Take 1 tablet by mouth at  bedtime 90 tablet 3  . vitamin B-12 (CYANOCOBALAMIN) 1000 MCG  tablet Take 1,000 mcg by mouth every other day.    . warfarin (COUMADIN) 5 MG tablet Take as directed by  anticoagulation clinic 110 tablet 0  . benzonatate (TESSALON) 100 MG capsule Take 1 capsule (100 mg total) by mouth 3 (three) times  daily as needed for cough. 20 capsule 0  . doxycycline (VIBRA-TABS) 100 MG tablet Take 1 tablet (100 mg total) by mouth 2 (two) times daily. 20 tablet 0   No facility-administered medications prior to visit.    No Known Allergies  Review of Systems  Constitutional: Negative for fever and malaise/fatigue.  HENT: Negative for congestion.   Eyes: Negative for blurred vision.  Respiratory: Negative for shortness of breath.   Cardiovascular: Negative for chest pain, palpitations and leg swelling.  Gastrointestinal: Negative for nausea, abdominal pain and blood in stool.  Genitourinary: Negative for dysuria and frequency.  Musculoskeletal: Negative for falls.  Skin: Negative for rash.  Neurological: Negative for dizziness, loss of consciousness and headaches.  Endo/Heme/Allergies: Negative for environmental allergies.  Psychiatric/Behavioral: Negative for depression. The patient is not nervous/anxious.        Objective:    Physical Exam  Constitutional: He is oriented to person, place, and time. He appears well-developed and well-nourished. No distress.  HENT:  Head: Normocephalic and atraumatic.  Eyes: Conjunctivae are normal.  Neck: Neck supple. No thyromegaly present.  Cardiovascular: Normal rate, regular rhythm and normal heart sounds.   No murmur heard. Pulmonary/Chest: Effort normal and breath sounds normal. No respiratory distress. He has no wheezes.  Abdominal: Soft. Bowel sounds are normal. He exhibits no mass. There is no tenderness.  Musculoskeletal: He exhibits no edema.  Lymphadenopathy:    He has no cervical adenopathy.  Neurological: He is alert and oriented to person, place, and time.  Skin: Skin is warm and dry.  Psychiatric: He  has a normal mood and affect. His behavior is normal.    BP 118/78 mmHg  Pulse 65  Temp(Src) 97.7 F (36.5 C) (Oral)  Ht 6' (1.829 m)  Wt 204 lb 4 oz (92.647 kg)  BMI 27.70 kg/m2  SpO2 93% Wt Readings from Last 3 Encounters:  06/05/15 204 lb 4 oz (92.647 kg)  05/08/15 204 lb (92.534 kg)  02/06/15 206 lb (93.441 kg)     Lab Results  Component Value Date   WBC 4.1 06/05/2015   HGB 14.4 06/05/2015   HCT 42.5 06/05/2015   PLT 59.0* 06/05/2015   GLUCOSE 109* 06/05/2015   CHOL 122 06/05/2015   TRIG 127.0 06/05/2015   HDL 42.80 06/05/2015   LDLDIRECT 81.6 12/30/2013   LDLCALC 53 06/05/2015   ALT 20 06/05/2015   AST 34 06/05/2015   NA 139 06/05/2015   K 3.8 06/05/2015   CL 103 06/05/2015   CREATININE 1.03 06/05/2015   BUN 12 06/05/2015   CO2 29 06/05/2015   TSH 1.92 06/05/2015   PSA 2.93 11/24/2010   INR 2.9 06/10/2015    Lab Results  Component Value Date   TSH 1.92 06/05/2015   Lab Results  Component Value Date   WBC 4.1 06/05/2015   HGB 14.4 06/05/2015   HCT 42.5 06/05/2015   MCV 89.4 06/05/2015   PLT 59.0* 06/05/2015   Lab Results  Component Value Date   NA 139 06/05/2015   K 3.8 06/05/2015   CO2 29 06/05/2015   GLUCOSE 109* 06/05/2015   BUN 12 06/05/2015   CREATININE 1.03 06/05/2015   BILITOT 3.4* 06/05/2015   ALKPHOS 76 06/05/2015   AST 34 06/05/2015   ALT 20 06/05/2015   PROT 5.8* 06/05/2015   ALBUMIN 3.8 06/05/2015   CALCIUM 9.2 06/05/2015   GFR 73.48 06/05/2015   Lab Results  Component Value Date   CHOL 122 06/05/2015   Lab Results  Component Value Date   HDL 42.80 06/05/2015   Lab Results  Component Value Date   LDLCALC 53 06/05/2015   Lab Results  Component Value Date   TRIG 127.0 06/05/2015   Lab Results  Component Value Date   CHOLHDL 3 06/05/2015   No results found for: HGBA1C     Assessment & Plan:   Problem List Items Addressed This Visit    Vitamin D deficiency   Relevant Orders   CBC (Completed)   TSH  (Completed)   Lipid panel (Completed)   Comprehensive metabolic panel (Completed)   Vitamin B12 (Completed)   Vitamin D 1,25 dihydroxy (Completed)   INR/PT (Completed)   Vitamin B 12 deficiency   Relevant Orders   CBC (Completed)   TSH (Completed)   Lipid panel (Completed)   Comprehensive metabolic panel (Completed)   Vitamin B12 (Completed)   Vitamin D 1,25 dihydroxy (Completed)   INR/PT (Completed)   Thrombocytopenia (HCC)    Asymptomatic, recheck cbc today      Relevant Orders   CBC (Completed)   TSH (Completed)   Lipid panel (Completed)   Comprehensive metabolic panel (Completed)   Vitamin B12 (Completed)   Vitamin D 1,25 dihydroxy (Completed)   INR/PT (Completed)   Hypertension    Well controlled, no changes to meds. Encouraged heart healthy diet such as the DASH diet and exercise as tolerated.       Relevant Orders   CBC (Completed)   TSH (Completed)   Lipid panel (Completed)   Comprehensive metabolic panel (Completed)   Vitamin B12 (Completed)   Vitamin D 1,25 dihydroxy (Completed)   INR/PT (Completed)   Hyperlipemia   Relevant Orders   CBC (Completed)   TSH (Completed)   Lipid panel (Completed)   Comprehensive metabolic panel (Completed)   Vitamin B12 (Completed)   Vitamin D 1,25 dihydroxy (Completed)   INR/PT (Completed)   GERD    Avoid offending foods, start probiotics. Do not eat large meals in late evening and consider raising head of bed.       Relevant Orders   CBC (Completed)   TSH (Completed)   Lipid panel (Completed)   Comprehensive metabolic panel (Completed)   Vitamin B12 (Completed)   Vitamin D 1,25 dihydroxy (Completed)   INR/PT (Completed)   Acute blood loss anemia   Relevant Orders   CBC (Completed)   TSH (Completed)   Lipid panel (Completed)   Comprehensive metabolic panel (Completed)   Vitamin B12 (Completed)   Vitamin D 1,25 dihydroxy (Completed)   INR/PT (Completed)    Other Visit Diagnoses    Need for shingles vaccine     -  Primary    Relevant Medications    Zoster Vaccine Live, PF, (ZOSTAVAX) 56387 UNT/0.65ML injection       I have discontinued Mr. Lowden benzonatate and doxycycline. I am also having him start on Zoster Vaccine Live (PF). Additionally, I am having him maintain his cholecalciferol, pantoprazole, vitamin F-64, folic acid, carvedilol, nystatin, simvastatin, warfarin, potassium chloride SA, lisinopril, and furosemide.  Meds ordered this encounter  Medications  . Zoster Vaccine Live, PF, (ZOSTAVAX) 33295 UNT/0.65ML injection    Sig: Inject 19,400 Units into the skin once.    Dispense:  1 each    Refill:  0     Penni Homans, MD

## 2015-06-08 ENCOUNTER — Ambulatory Visit (INDEPENDENT_AMBULATORY_CARE_PROVIDER_SITE_OTHER): Payer: Medicare Other | Admitting: *Deleted

## 2015-06-08 ENCOUNTER — Telehealth: Payer: Self-pay | Admitting: Internal Medicine

## 2015-06-08 DIAGNOSIS — I509 Heart failure, unspecified: Secondary | ICD-10-CM | POA: Diagnosis not present

## 2015-06-08 DIAGNOSIS — Z95810 Presence of automatic (implantable) cardiac defibrillator: Secondary | ICD-10-CM

## 2015-06-08 LAB — VITAMIN D 1,25 DIHYDROXY
VITAMIN D 1, 25 (OH) TOTAL: 47 pg/mL (ref 18–72)
Vitamin D2 1, 25 (OH)2: <8 pg/mL
Vitamin D3 1, 25 (OH)2: 47 pg/mL

## 2015-06-08 NOTE — Progress Notes (Signed)
Remote ICD transmission.   

## 2015-06-08 NOTE — Telephone Encounter (Signed)
Pt has remote scheduled for day that they didn't know about-also one on 08-07-15-they are confused-they thought one was just done -[phone]

## 2015-06-08 NOTE — Progress Notes (Signed)
EPIC Encounter for ICM Monitoring  Patient Name: [name] is a 82 y.o. male Date: 06/08/2015 Primary Care Physican: Penni Homans, MD Primary Cardiologist: Meda Coffee Electrophysiologist: Caryl Comes Dry Weight: 203 lbs    Right Ventricular pacing 94 % and Left ventricular pacing 98%  In the past month, have you:  1. Gained more than 2 pounds in a day or more than 5 pounds in a week? no  2. Had changes in your medications (with verification of current medications)? no  3. Had more shortness of breath than is usual for you? no  4. Limited your activity because of shortness of breath? no  5. Not been able to sleep because of shortness of breath? no  6. Had increased swelling in your feet, ankles, legs or stomach area? no  7. Had symptoms of dehydration (dizziness, dry mouth, increased thirst, decreased urine output) no  8. Had changes in sodium restriction? no  9. Been compliant with medication? Yes  ICM trend: 3 month view for 06/08/2015  ICM trend: 1 year view for 06/08/2015  Follow-up plan: ICM clinic phone appointment 07/09/2015.    FLUID LEVELS:  Latitude respiratory rate median ranged was 20 to 24 with highest repiratory rate during week of 05/10/2015.  Activity level dropped to 0.9 hours on 05/10/2015.  He denied any fluid symptoms during that time.    SYMPTOMS:   Reported he has some lower extremity swelling if he is on his feet a lot and but resolves after sleeping. Encouraged to call for any fluid symptoms.  Patient asked about his leads and said there was a discussion at last office visit, with Tommye Standard, NP on 05/08/2015 regarding noise.  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  He stated within the last year, he has accidentally touched the fence and received a shock from the fence.  Advised I checked with Tobin Chad Saunder's device tech regarding electric fences.  Explained he should not be around electric fence which can cause the device to  reset or he may receive a shock due to the lead noise.  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  No changes today.    Rosalene Billings, RN, CCM 06/08/2015 1:09 PM

## 2015-06-08 NOTE — Telephone Encounter (Signed)
Spoke with patient and explained transmissions.

## 2015-06-08 NOTE — Addendum Note (Signed)
Addended by: Tiajuana Amass on: 06/08/2015 04:54 PM   Modules accepted: Level of Service

## 2015-06-10 ENCOUNTER — Ambulatory Visit (INDEPENDENT_AMBULATORY_CARE_PROVIDER_SITE_OTHER): Payer: Medicare Other | Admitting: Pharmacist

## 2015-06-10 DIAGNOSIS — K55069 Acute infarction of intestine, part and extent unspecified: Secondary | ICD-10-CM

## 2015-06-10 LAB — POCT INR: INR: 2.9

## 2015-06-24 ENCOUNTER — Ambulatory Visit (INDEPENDENT_AMBULATORY_CARE_PROVIDER_SITE_OTHER): Payer: Medicare Other | Admitting: Surgery

## 2015-06-24 DIAGNOSIS — K55069 Acute infarction of intestine, part and extent unspecified: Secondary | ICD-10-CM | POA: Diagnosis not present

## 2015-06-24 LAB — POCT INR: INR: 2.6

## 2015-06-26 ENCOUNTER — Other Ambulatory Visit: Payer: Self-pay | Admitting: Cardiology

## 2015-07-01 ENCOUNTER — Other Ambulatory Visit: Payer: Self-pay | Admitting: *Deleted

## 2015-07-01 MED ORDER — WARFARIN SODIUM 5 MG PO TABS: 5.0000 mg | ORAL_TABLET | ORAL | Status: DC

## 2015-07-09 ENCOUNTER — Ambulatory Visit (INDEPENDENT_AMBULATORY_CARE_PROVIDER_SITE_OTHER): Payer: Medicare Other

## 2015-07-09 DIAGNOSIS — Z95810 Presence of automatic (implantable) cardiac defibrillator: Secondary | ICD-10-CM

## 2015-07-09 DIAGNOSIS — I509 Heart failure, unspecified: Secondary | ICD-10-CM | POA: Diagnosis not present

## 2015-07-09 NOTE — Progress Notes (Signed)
EPIC Encounter for ICM Monitoring  Patient Name: [name] is a 82 y.o. male Date: 07/09/2015 Primary Care Physican: Penni Homans, MD Primary Cardiologist: Meda Coffee Electrophysiologist: Caryl Comes Dry Weight: 198 lbs   Right Ventricular Pacing 95% and Left Ventricular Pacing 97%     In the past month, have you:  1. Gained more than 2 pounds in a day or more than 5 pounds in a week? no  2. Had changes in your medications (with verification of current medications)? no  3. Had more shortness of breath than is usual for you? no  4. Limited your activity because of shortness of breath? no  5. Not been able to sleep because of shortness of breath? no  6. Had increased swelling in your feet, ankles, legs or stomach area? no  7. Had symptoms of dehydration (dizziness, dry mouth, increased thirst, decreased urine output) no  8. Had changes in sodium restriction? no  9. Been compliant with medication? Yes  ICM trend: 6 month view for 07/06/2015   Follow-up plan: ICM clinic phone appointment 08/13/2015.    FLUID LEVELS: Latitude respiratory rate median range was 20-21 and activity level 1.2 hours to 4.8 hours from 06/06/2015 to 07/05/2015.    SYMPTOMS:   None.  Denied any symptoms such as weight gain of 3 pounds overnight or 5 pounds within a week, SOB and/or lower extremity swelling. Encouraged to call for any fluid symptoms.   EDUCATION: Limit sodium intake to < 2000 mg and fluid intake to 64 oz daily.     RECOMMENDATIONS: No changes today.        Rosalene Billings, RN, CCM 07/09/2015 1:12 PM

## 2015-07-15 ENCOUNTER — Encounter: Payer: Self-pay | Admitting: Cardiology

## 2015-07-16 LAB — CUP PACEART REMOTE DEVICE CHECK
Battery Remaining Longevity: 54 mo
Battery Remaining Percentage: 85 %
Brady Statistic RA Percent Paced: 74 %
Brady Statistic RV Percent Paced: 94 %
HIGH POWER IMPEDANCE MEASURED VALUE: 56 Ohm
Implantable Lead Implant Date: 20060808
Implantable Lead Location: 753860
Implantable Lead Serial Number: 261368
Lead Channel Pacing Threshold Amplitude: 0.6 V
Lead Channel Pacing Threshold Amplitude: 1.1 V
Lead Channel Pacing Threshold Pulse Width: 0.4 ms
Lead Channel Pacing Threshold Pulse Width: 0.4 ms
Lead Channel Pacing Threshold Pulse Width: 0.8 ms
Lead Channel Setting Pacing Amplitude: 2.3 V
Lead Channel Setting Pacing Pulse Width: 0.8 ms
Lead Channel Setting Sensing Sensitivity: 0.6 mV
MDC IDC LEAD IMPLANT DT: 20060808
MDC IDC LEAD IMPLANT DT: 20060911
MDC IDC LEAD LOCATION: 753858
MDC IDC LEAD LOCATION: 753859
MDC IDC LEAD MODEL: 158
MDC IDC LEAD MODEL: 4087
MDC IDC LEAD SERIAL: 165392
MDC IDC MSMT LEADCHNL LV IMPEDANCE VALUE: 421 Ohm
MDC IDC MSMT LEADCHNL RA IMPEDANCE VALUE: 445 Ohm
MDC IDC MSMT LEADCHNL RV IMPEDANCE VALUE: 547 Ohm
MDC IDC MSMT LEADCHNL RV PACING THRESHOLD AMPLITUDE: 0.8 V
MDC IDC PG SERIAL: 490477
MDC IDC SESS DTM: 20170508054100
MDC IDC SET LEADCHNL LV PACING AMPLITUDE: 2 V
MDC IDC SET LEADCHNL LV SENSING SENSITIVITY: 1 mV
MDC IDC SET LEADCHNL RV PACING AMPLITUDE: 2.4 V
MDC IDC SET LEADCHNL RV PACING PULSEWIDTH: 0.4 ms

## 2015-07-22 ENCOUNTER — Ambulatory Visit (INDEPENDENT_AMBULATORY_CARE_PROVIDER_SITE_OTHER): Payer: Medicare Other | Admitting: *Deleted

## 2015-07-22 DIAGNOSIS — K55069 Acute infarction of intestine, part and extent unspecified: Secondary | ICD-10-CM

## 2015-07-22 LAB — POCT INR: INR: 3.8

## 2015-08-10 ENCOUNTER — Encounter (INDEPENDENT_AMBULATORY_CARE_PROVIDER_SITE_OTHER): Payer: Self-pay

## 2015-08-10 ENCOUNTER — Ambulatory Visit (INDEPENDENT_AMBULATORY_CARE_PROVIDER_SITE_OTHER): Payer: Medicare Other

## 2015-08-10 DIAGNOSIS — K55069 Acute infarction of intestine, part and extent unspecified: Secondary | ICD-10-CM | POA: Diagnosis not present

## 2015-08-10 LAB — POCT INR: INR: 2.4

## 2015-08-13 ENCOUNTER — Ambulatory Visit (INDEPENDENT_AMBULATORY_CARE_PROVIDER_SITE_OTHER): Payer: Medicare Other | Admitting: *Deleted

## 2015-08-13 DIAGNOSIS — I255 Ischemic cardiomyopathy: Secondary | ICD-10-CM | POA: Diagnosis not present

## 2015-08-13 DIAGNOSIS — Z95810 Presence of automatic (implantable) cardiac defibrillator: Secondary | ICD-10-CM

## 2015-08-13 DIAGNOSIS — I509 Heart failure, unspecified: Secondary | ICD-10-CM | POA: Diagnosis not present

## 2015-08-13 NOTE — Progress Notes (Signed)
Remote ICD transmission.   

## 2015-08-14 ENCOUNTER — Other Ambulatory Visit: Payer: Self-pay | Admitting: Cardiology

## 2015-08-14 ENCOUNTER — Other Ambulatory Visit: Payer: Self-pay | Admitting: Family Medicine

## 2015-08-14 NOTE — Telephone Encounter (Signed)
Will Dr Meda Coffee take over refilling this?

## 2015-08-14 NOTE — Progress Notes (Signed)
EPIC Encounter for ICM Monitoring  Patient Name: [name] is a 82 y.o. male Date: 08/14/2015 Primary Care Physican: Penni Homans, MD Primary Cardiologist: Meda Coffee Electrophysiologist: Caryl Comes Dry Weight: 191 lb  Right Ventricular Pacing 96% and Left Ventricular Pacing 98%       Spoke with wife (DPR).  Heart Failure questions reviewed, pt asymptomatic.   Latitude respiratory rate median range was 20-21 and activity level 1.5 hours    Recommendations: No changes.     ICM trend: 3 month view   ICM trend: 1 year view   Follow-up plan: ICM clinic phone appointment on 09/14/2015.  Copy of ICM check sent to device physician.   Rosalene Billings, RN 08/14/2015 11:38 AM

## 2015-08-18 DIAGNOSIS — H02403 Unspecified ptosis of bilateral eyelids: Secondary | ICD-10-CM | POA: Diagnosis not present

## 2015-08-18 DIAGNOSIS — H524 Presbyopia: Secondary | ICD-10-CM | POA: Diagnosis not present

## 2015-08-19 ENCOUNTER — Encounter: Payer: Self-pay | Admitting: Cardiology

## 2015-08-19 LAB — CUP PACEART REMOTE DEVICE CHECK
Brady Statistic RA Percent Paced: 83 %
Brady Statistic RV Percent Paced: 96 %
HighPow Impedance: 61 Ohm
Implantable Lead Implant Date: 20060808
Implantable Lead Implant Date: 20060808
Implantable Lead Implant Date: 20060911
Implantable Lead Location: 753858
Implantable Lead Location: 753859
Implantable Lead Model: 4087
Implantable Lead Serial Number: 165392
Lead Channel Impedance Value: 465 Ohm
Lead Channel Impedance Value: 479 Ohm
Lead Channel Setting Pacing Amplitude: 2 V
Lead Channel Setting Pacing Amplitude: 2.4 V
Lead Channel Setting Pacing Pulse Width: 0.8 ms
Lead Channel Setting Sensing Sensitivity: 0.6 mV
Lead Channel Setting Sensing Sensitivity: 1 mV
MDC IDC LEAD LOCATION: 753860
MDC IDC LEAD MODEL: 158
MDC IDC LEAD SERIAL: 261368
MDC IDC MSMT BATTERY REMAINING LONGEVITY: 54 mo
MDC IDC MSMT BATTERY REMAINING PERCENTAGE: 86 %
MDC IDC MSMT LEADCHNL RV IMPEDANCE VALUE: 609 Ohm
MDC IDC PG SERIAL: 490477
MDC IDC SESS DTM: 20170710133700
MDC IDC SET LEADCHNL RA PACING AMPLITUDE: 2.3 V
MDC IDC SET LEADCHNL RV PACING PULSEWIDTH: 0.4 ms

## 2015-08-20 NOTE — Telephone Encounter (Signed)
PT  SEEN DR  Meda Coffee IN January AND WAS INSTRUCTED TO  F/U IN  6 MONTHS   SO   PT   DOES BELONG  TO DR Meda Coffee.

## 2015-08-31 ENCOUNTER — Ambulatory Visit (INDEPENDENT_AMBULATORY_CARE_PROVIDER_SITE_OTHER): Payer: Medicare Other | Admitting: Pharmacist

## 2015-08-31 DIAGNOSIS — K55069 Acute infarction of intestine, part and extent unspecified: Secondary | ICD-10-CM

## 2015-08-31 LAB — POCT INR: INR: 3.2

## 2015-09-07 DIAGNOSIS — L570 Actinic keratosis: Secondary | ICD-10-CM | POA: Diagnosis not present

## 2015-09-07 DIAGNOSIS — L821 Other seborrheic keratosis: Secondary | ICD-10-CM | POA: Diagnosis not present

## 2015-09-07 DIAGNOSIS — D1801 Hemangioma of skin and subcutaneous tissue: Secondary | ICD-10-CM | POA: Diagnosis not present

## 2015-09-07 DIAGNOSIS — D235 Other benign neoplasm of skin of trunk: Secondary | ICD-10-CM | POA: Diagnosis not present

## 2015-09-07 DIAGNOSIS — Z85820 Personal history of malignant melanoma of skin: Secondary | ICD-10-CM | POA: Diagnosis not present

## 2015-09-14 ENCOUNTER — Ambulatory Visit (INDEPENDENT_AMBULATORY_CARE_PROVIDER_SITE_OTHER): Payer: Medicare Other

## 2015-09-14 DIAGNOSIS — I509 Heart failure, unspecified: Secondary | ICD-10-CM

## 2015-09-14 DIAGNOSIS — Z95810 Presence of automatic (implantable) cardiac defibrillator: Secondary | ICD-10-CM

## 2015-09-15 ENCOUNTER — Telehealth: Payer: Self-pay

## 2015-09-15 NOTE — Telephone Encounter (Signed)
Remote ICM transmission received.  Attempted patient call and left message for return call.   

## 2015-09-15 NOTE — Progress Notes (Addendum)
EPIC Encounter for ICM Monitoring  Patient Name: [name] is a 82 y.o. male Date: 09/15/2015 Primary Care Physican: Penni Homans, MD Primary Cardiologist: Meda Coffee Electrophysiologist: Caryl Comes Dry Weight:  195 lbs   Right Ventricular Pacing 96% and Left Ventricular Pacing 98%                       Spoke with husband and wife (DPR).  Heart Failure questions reviewed, pt asymptomatic.   Latitude respiratory rate median range was 20-21 and activity level 2.2 hours    Follow-up plan: ICM clinic phone appointment on 10/19/2015.  Copy of ICM check sent to device physician.   ICM trend: 09/15/2015       Rosalene Billings, RN 09/15/2015 10:47 AM

## 2015-09-16 ENCOUNTER — Telehealth: Payer: Self-pay | Admitting: Internal Medicine

## 2015-09-16 ENCOUNTER — Other Ambulatory Visit: Payer: Self-pay

## 2015-09-16 DIAGNOSIS — K7469 Other cirrhosis of liver: Secondary | ICD-10-CM

## 2015-09-16 NOTE — Telephone Encounter (Signed)
Pt scheduled for [place] of ABD at [facility] 09/23/15@9 :30am. Pt to arrive there at 9:15am. Pt to be NPO after midnight. Pts wife aware of appt.

## 2015-09-21 ENCOUNTER — Ambulatory Visit (INDEPENDENT_AMBULATORY_CARE_PROVIDER_SITE_OTHER): Payer: Medicare Other | Admitting: *Deleted

## 2015-09-21 DIAGNOSIS — K55069 Acute infarction of intestine, part and extent unspecified: Secondary | ICD-10-CM

## 2015-09-21 LAB — POCT INR: INR: 2.3

## 2015-09-23 ENCOUNTER — Ambulatory Visit (HOSPITAL_COMMUNITY)
Admission: RE | Admit: 2015-09-23 | Discharge: 2015-09-23 | Disposition: A | Discharge status: Home / Self Care | Payer: Medicare Other | Source: Ambulatory Visit | Attending: Internal Medicine | Admitting: Internal Medicine

## 2015-09-23 DIAGNOSIS — R93422 Abnormal radiologic findings on diagnostic imaging of left kidney: Secondary | ICD-10-CM | POA: Diagnosis not present

## 2015-09-23 DIAGNOSIS — R161 Splenomegaly, not elsewhere classified: Secondary | ICD-10-CM | POA: Diagnosis not present

## 2015-09-23 DIAGNOSIS — R140 Abdominal distension (gaseous): Secondary | ICD-10-CM | POA: Insufficient documentation

## 2015-09-23 DIAGNOSIS — K7469 Other cirrhosis of liver: Secondary | ICD-10-CM | POA: Diagnosis not present

## 2015-09-23 DIAGNOSIS — R93421 Abnormal radiologic findings on diagnostic imaging of right kidney: Secondary | ICD-10-CM | POA: Diagnosis not present

## 2015-09-23 DIAGNOSIS — K7460 Unspecified cirrhosis of liver: Secondary | ICD-10-CM | POA: Diagnosis not present

## 2015-10-09 ENCOUNTER — Encounter: Payer: Self-pay | Admitting: Internal Medicine

## 2015-10-14 DIAGNOSIS — J189 Pneumonia, unspecified organism: Secondary | ICD-10-CM | POA: Diagnosis not present

## 2015-10-19 ENCOUNTER — Ambulatory Visit (INDEPENDENT_AMBULATORY_CARE_PROVIDER_SITE_OTHER): Payer: Medicare Other

## 2015-10-19 DIAGNOSIS — I5022 Chronic systolic (congestive) heart failure: Secondary | ICD-10-CM

## 2015-10-19 DIAGNOSIS — Z95810 Presence of automatic (implantable) cardiac defibrillator: Secondary | ICD-10-CM | POA: Diagnosis not present

## 2015-10-19 DIAGNOSIS — I509 Heart failure, unspecified: Secondary | ICD-10-CM

## 2015-10-22 ENCOUNTER — Ambulatory Visit (INDEPENDENT_AMBULATORY_CARE_PROVIDER_SITE_OTHER): Payer: Medicare Other | Admitting: Pharmacist

## 2015-10-22 ENCOUNTER — Encounter: Payer: Self-pay | Admitting: Cardiology

## 2015-10-22 ENCOUNTER — Ambulatory Visit (INDEPENDENT_AMBULATORY_CARE_PROVIDER_SITE_OTHER): Payer: Medicare Other | Admitting: Cardiology

## 2015-10-22 VITALS — BP 132/62 | HR 60 | Ht 72.0 in | Wt 194.0 lb

## 2015-10-22 DIAGNOSIS — K55069 Acute infarction of intestine, part and extent unspecified: Secondary | ICD-10-CM

## 2015-10-22 DIAGNOSIS — I255 Ischemic cardiomyopathy: Secondary | ICD-10-CM

## 2015-10-22 DIAGNOSIS — E785 Hyperlipidemia, unspecified: Secondary | ICD-10-CM

## 2015-10-22 DIAGNOSIS — I2589 Other forms of chronic ischemic heart disease: Secondary | ICD-10-CM

## 2015-10-22 DIAGNOSIS — I480 Paroxysmal atrial fibrillation: Secondary | ICD-10-CM | POA: Diagnosis not present

## 2015-10-22 DIAGNOSIS — Z95810 Presence of automatic (implantable) cardiac defibrillator: Secondary | ICD-10-CM

## 2015-10-22 DIAGNOSIS — I10 Essential (primary) hypertension: Secondary | ICD-10-CM

## 2015-10-22 DIAGNOSIS — I5043 Acute on chronic combined systolic (congestive) and diastolic (congestive) heart failure: Secondary | ICD-10-CM

## 2015-10-22 LAB — POCT INR: INR: 2.9

## 2015-10-22 NOTE — Patient Instructions (Signed)

## 2015-10-22 NOTE — Progress Notes (Signed)
Cardiology Office Note  Date:  10/22/2015   ID:  [name], DOB 1931-06-27, MRN [id number]  PCP:  Penni Homans, MD  Cardiologist:  Ena Dawley, MD   Chief complain: LE edema   History of Present Illness: [name] is a 82 y.o. male , previously patient of the [name], with h/o CAD, s/p CABG, s/p BiV ICD placement for ischemic CMP, LVEF 35%, his last catheterization in 2006 demonstrated a total right and 80% marginal. Vein graft to the diagonal was occluded and other bypasses were open; ejection fraction 2007 was about 35%  Nuclear scan Oct 2011>>scar no ischemia.  He has been last seen 6 months ago, today he states that he feels well, denies chest pain, DOE, orthopnea, PNE. He stays very active and gets LE edema on the days when he walks more. Today increased swelling, he put compressions stockings on this am. No palpitations, ICD shocks or syncope.  Complaint with his meds.  10/22/2015 - the patient remains very active around his house, and has no chest pain and dyspnea only on moderate to severe exertion. He continues to have LE edema toward the end of the day. He doesn't use compression stockings all the time especially in summer when its too hot. No ICD shocks, no syncope.  Past Medical History:  Diagnosis Date  . Acute bronchitis 09/02/2013  . Acute vascular insufficiency of intestine (HCC)    Mesenteric embolus...surgery 2003  . Anxiety   . Atrial fibrillation (HCC)    paroxysmal  . Biventricular implantable cardiac defibrillator in situ    GDT CONTAK H170-MADIT-CRT-EXPLANTED 2011 implanted defibrillator- Guidant cognis, model n119-2001 Dr. Caryl Comes (11/28/2009)  . Breast tenderness    Spironolactone was stopped and eplerinone started. Patient feels better  . CAD (coronary artery disease)    CABG 1994  /   catheterization 1996, occluded vein graft to the diagonal, other grafts patent  /   catheterization 2006, no PCI  /  nuclear, October, 2011, extensive scar  anteroseptal and apical, no ischemia  . Carotid artery disease ([place])    Doppler, February,  2012, 0-39% bilateral, recommended followup 1 year  . CHF (congestive heart failure) (HCC)    chronic systolic  . Chronotropic incompetence   . Colon polyp   . Colonic polyp 2010   pathology not clear.   . Crohn's disease ([place])   . Degenerative joint disease   . Dizziness    Positional dizziness  . Edema 09/02/2013  . Edema leg    Venous Dopplers 8/13: No DVT bilaterally  . GERD (gastroesophageal reflux disease)   . Hx of CABG    1994  . Hyperlipemia   . Hypertension   . Hypertrophy of prostate with urinary obstruction and other lower urinary tract symptoms (LUTS)   . Iron deficiency anemia   . Ischemic cardiomyopathy    CABG 1994 CAD catherization 1996.[name] occluded vein graft to the diagnol, other grafts patent/  catherization 2006, no PCI/ nuclear.[name] october 2011.[name] extensive scar anteroseptal and apical.. no ischemia    . LBBB (left bundle branch block)   . Mitral regurgitation    mild.[name] echo november 2011  EF 35%...echo.. november 2009/ ef 35%.[name] echo november 2011  . Oral thrush 02/05/2015  . Polymyalgia rheumatica ([place])   . Shingles   . Sinus bradycardia   . Thrombocytopenia ([place]) 2008  . Thrombocytopenia ([place]) 11/30/2011  . Thrush   . Tremor   . Ventral hernia    multiple, wears an  abd binder, reports he hs been told he was at increased riisk for surgery  . Vitamin B12 deficiency   . Warfarin anticoagulation    Coumadin therapy for mesenteric embolus 2003    Past Surgical History:  Procedure Laterality Date  . CATARACT EXTRACTION, BILATERAL  2014  . COLONOSCOPY N/A 04/19/2013   Procedure: COLONOSCOPY;  Surgeon: Jerene Bears, MD;  Location: [facility] ENDOSCOPY;  Service: Endoscopy;  Laterality: N/A;  . CORONARY ARTERY BYPASS GRAFT  1994  . Elap w/ superior mesenteric artery embolectomy  1/03   by Dr. Jennette Banker  . ESOPHAGOGASTRODUODENOSCOPY N/A 04/18/2013   Procedure:  ESOPHAGOGASTRODUODENOSCOPY (EGD);  Surgeon: Jerene Bears, MD;  Location: [facility] ENDOSCOPY;  Service: Endoscopy;  Laterality: N/A;  . implantation of biventric cardioverter-defibrillatorr     by Dr. Lovena Le in 8/06 Guidant Contak H170-explanted 2011  . implantation of guidant cognis device     model n119-2011  . laparoscopic cholecystectomy  2002  . left inguinal hernia repair with mesh  6/08   by Dr. Betsy Pries  . PROSTATE SURGERY  08/2012   by urology in [place]  . Repair of incarcerated ventral hernia and lysis of adhesions  11/03   by Dr. Betsy Pries   Current Outpatient Prescriptions  Medication Sig Dispense Refill  . carvedilol (COREG) 6.25 MG tablet Take 1 tablet by mouth  twice a day with meals 180 tablet 0  . cholecalciferol (VITAMIN D) 1000 UNITS tablet Take 1,000 Units by mouth daily.     . folic acid (FOLVITE) 1 MG tablet TAKE 1 TABLET EVERY DAY    . furosemide (LASIX) 40 MG tablet Take 1 tablet by mouth two  times daily 180 tablet 1  . lisinopril (PRINIVIL,ZESTRIL) 10 MG tablet Take 1 tablet by mouth  daily 90 tablet 1  . nystatin (MYCOSTATIN) 100000 UNIT/ML suspension Take 5 mLs (500,000 Units total) by mouth 4 (four) times daily. 240 mL 1  . pantoprazole (PROTONIX) 40 MG tablet TAKE 1 TABLET BY MOUTH  EVERY DAY AT 12 NOON 90 tablet 6  . potassium chloride SA (KLOR-CON M20) 20 MEQ tablet Take 1 tablet (20 mEq total) by mouth daily. 90 tablet 3  . simvastatin (ZOCOR) 20 MG tablet Take 1 tablet by mouth at  bedtime 90 tablet 3  . vitamin B-12 (CYANOCOBALAMIN) 1000 MCG tablet Take 1,000 mcg by mouth every other day.    . warfarin (COUMADIN) 5 MG tablet Take 1 tablet (5 mg total) by mouth as directed. 40 tablet 3   No current facility-administered medications for this visit.     Allergies:   Review of patient's allergies indicates no known allergies.    Social History:  The patient  reports that he quit smoking about 42 years ago. His smoking use included Cigarettes. He has a 20.00  pack-year smoking history. He has never used smokeless tobacco. He reports that he drinks alcohol. He reports that he does not use drugs.   Family History:  The patient's family history includes Appendicitis in his brother; Cirrhosis in his brother; Dementia in his sister; Diabetes in his son; Edema in his sister; Heart disease in his mother, sister, sister, and sister; Heart disease (age of onset: 62) in his father; Heart failure in his sister; Hypertension in his brother, father, and sister; Kidney disease in his brother; Leukemia in his brother; Myasthenia gravis in his brother; Tremor in his sister.    ROS:  Please see the history of present illness.   Otherwise, review of systems are  positive for none.   All other systems are reviewed and negative.   PHYSICAL EXAM: VS:  BP 132/62   Pulse 60   Ht 6' (1.829 m)   Wt 194 lb (88 kg)   BMI 26.31 kg/m  , BMI Body mass index is 26.31 kg/m. GEN: Well nourished, well developed, in no acute distress  HEENT: normal  Neck: no JVD, carotid bruits, or masses Cardiac: RRR; no murmurs, rubs, or gallops, mild B/L LE edema  Respiratory:  clear to auscultation bilaterally, normal work of breathing GI: soft, nontender, nondistended, + BS MS: no deformity or atrophy  Skin: warm and dry, no rash Neuro:  Strength and sensation are intact Psych: euthymic mood, full affect  EKG:  EKG is ordered today. The ekg ordered today demonstrates A-sensed BiV paced rhythm  Recent Labs: 06/05/2015: ALT 20; BUN 12; Creatinine, Ser 1.03; Hemoglobin 14.4; Platelets 59.0; Potassium 3.8; Sodium 139; TSH 1.92   Lipid Panel    Component Value Date/Time   CHOL 122 06/05/2015 1100   TRIG 127.0 06/05/2015 1100   HDL 42.80 06/05/2015 1100   CHOLHDL 3 06/05/2015 1100   VLDL 25.4 06/05/2015 1100   LDLCALC 53 06/05/2015 1100   LDLDIRECT 81.6 12/30/2013 1213   Wt Readings from Last 3 Encounters:  10/22/15 194 lb (88 kg)  06/05/15 204 lb 4 oz (92.6 kg)  05/08/15 204 lb  (92.5 kg)    TTE: 11/03/2014 - Left ventricle: Hypokinesis of the basal and mid inferolateral   walls. The cavity size was mildly dilated. There was moderate   concentric hypertrophy. Systolic function was moderately reduced.   The estimated ejection fraction was in the range of 35% to 40%.   Wall motion was normal; there were no regional wall motion   abnormalities. Doppler parameters are consistent with abnormal   left ventricular relaxation (grade 1 diastolic dysfunction). - Aortic root: The aortic root was normal in size. - Left atrium: The atrium was moderately dilated. - Right ventricle: Systolic function was normal. - Right atrium: Pacer wire or catheter noted in right atrium. - Inferior vena cava: The vessel was not visualized. - Pericardium, extracardiac: There was no pericardial effusion.  Impressions:  - A limited study without use of Doppler.   LVEF 35-40%.   ASSESSMENT AND PLAN:  1. Acute on chronic combined systolic and diastolic CHF - mild fluid overload, chronic LE edema, normal in the mornings, continue the same dose of Lasix 40 mg po BID, encourage to use compression stockings.   2. Ischemic cardiomyopathy - LVEF 35-40% in 11/2014, asymptomatic, continue using carvedilol, lisinopril, simvastatin  3. S/P BiV ICD placement - Implantable defibrillator-[employer] The patient's device was last interrogated in 10/2014 and functioning well.  4. Hypertension - well controlled  5. Hyperlipidemia - ok LDL and HDL, mildly elevated triglycerides - diet changes only. On simvaststin 20 mg po daily.  6. PAF - in SR today, on warfarin, no bleeding.  Follow up in 6 months.  Signed, Ena Dawley, MD  10/22/2015 10:15 AM    [address], [place], [place]  [address] Phone: [phone]; Fax: [phone]

## 2015-10-23 ENCOUNTER — Encounter: Payer: Self-pay | Admitting: Internal Medicine

## 2015-10-29 NOTE — Progress Notes (Signed)
EPIC Encounter for ICM Monitoring  Patient Name: [name] is a 82 y.o. male Date: 10/29/2015 Primary Care Physican: Penni Homans, MD Primary Cardiologist:[place] Electrophysiologist: Caryl Comes Dry Weight: unknown Right Ventricular Pacing 95% and Left Ventricular Pacing 98%                                       Spoke with wife (DPR). Heart Failure questions reviewed, pt asymptomatic but did have pneumonia in the last 2 weeks.  Pneumonia has resolved.    Latitude respiratory rate median range was 21 and activity level 2.3 hours   Follow-up plan: ICM clinic phone appointment on 12/07/2015.  Appointment with Dr Caryl Comes on 11/10/2015  Copy of ICM check sent to device physician.   Trend showed respiratory rate of 23 on 9/11 and Activity level of 0.6 which was same time he had pneumonia     Rosalene Billings, RN 10/29/2015 2:33 PM

## 2015-11-02 ENCOUNTER — Encounter: Payer: Medicare Other | Admitting: Internal Medicine

## 2015-11-10 ENCOUNTER — Ambulatory Visit: Payer: Medicare Other | Admitting: *Deleted

## 2015-11-10 ENCOUNTER — Ambulatory Visit (INDEPENDENT_AMBULATORY_CARE_PROVIDER_SITE_OTHER): Payer: Medicare Other | Admitting: Family Medicine

## 2015-11-10 ENCOUNTER — Encounter: Payer: Medicare Other | Admitting: Internal Medicine

## 2015-11-10 ENCOUNTER — Encounter: Payer: Self-pay | Admitting: Internal Medicine

## 2015-11-10 ENCOUNTER — Ambulatory Visit (HOSPITAL_BASED_OUTPATIENT_CLINIC_OR_DEPARTMENT_OTHER)
Admission: RE | Admit: 2015-11-10 | Discharge: 2015-11-10 | Disposition: A | Discharge status: Home / Self Care | Payer: Medicare Other | Source: Ambulatory Visit | Attending: Family Medicine | Admitting: Family Medicine

## 2015-11-10 ENCOUNTER — Encounter: Payer: Self-pay | Admitting: Family Medicine

## 2015-11-10 ENCOUNTER — Ambulatory Visit (INDEPENDENT_AMBULATORY_CARE_PROVIDER_SITE_OTHER): Payer: Medicare Other | Admitting: Internal Medicine

## 2015-11-10 VITALS — BP 120/60 | HR 62 | Ht 72.0 in | Wt 191.0 lb

## 2015-11-10 VITALS — BP 122/72 | HR 72 | Temp 97.6°F | Ht 72.0 in | Wt 190.0 lb

## 2015-11-10 DIAGNOSIS — G4733 Obstructive sleep apnea (adult) (pediatric): Secondary | ICD-10-CM

## 2015-11-10 DIAGNOSIS — R739 Hyperglycemia, unspecified: Secondary | ICD-10-CM

## 2015-11-10 DIAGNOSIS — D62 Acute posthemorrhagic anemia: Secondary | ICD-10-CM | POA: Diagnosis not present

## 2015-11-10 DIAGNOSIS — E782 Mixed hyperlipidemia: Secondary | ICD-10-CM

## 2015-11-10 DIAGNOSIS — R918 Other nonspecific abnormal finding of lung field: Secondary | ICD-10-CM | POA: Insufficient documentation

## 2015-11-10 DIAGNOSIS — I714 Abdominal aortic aneurysm, without rupture, unspecified: Secondary | ICD-10-CM

## 2015-11-10 DIAGNOSIS — J189 Pneumonia, unspecified organism: Secondary | ICD-10-CM

## 2015-11-10 DIAGNOSIS — E559 Vitamin D deficiency, unspecified: Secondary | ICD-10-CM

## 2015-11-10 DIAGNOSIS — I700 Atherosclerosis of aorta: Secondary | ICD-10-CM | POA: Diagnosis not present

## 2015-11-10 DIAGNOSIS — I5022 Chronic systolic (congestive) heart failure: Secondary | ICD-10-CM | POA: Diagnosis not present

## 2015-11-10 DIAGNOSIS — E538 Deficiency of other specified B group vitamins: Secondary | ICD-10-CM | POA: Diagnosis not present

## 2015-11-10 DIAGNOSIS — Z95810 Presence of automatic (implantable) cardiac defibrillator: Secondary | ICD-10-CM | POA: Diagnosis not present

## 2015-11-10 DIAGNOSIS — J690 Pneumonitis due to inhalation of food and vomit: Secondary | ICD-10-CM

## 2015-11-10 DIAGNOSIS — R350 Frequency of micturition: Secondary | ICD-10-CM

## 2015-11-10 DIAGNOSIS — I2589 Other forms of chronic ischemic heart disease: Secondary | ICD-10-CM | POA: Diagnosis not present

## 2015-11-10 DIAGNOSIS — E8809 Other disorders of plasma-protein metabolism, not elsewhere classified: Secondary | ICD-10-CM

## 2015-11-10 DIAGNOSIS — I255 Ischemic cardiomyopathy: Secondary | ICD-10-CM

## 2015-11-10 DIAGNOSIS — I25810 Atherosclerosis of coronary artery bypass graft(s) without angina pectoris: Secondary | ICD-10-CM

## 2015-11-10 DIAGNOSIS — Z951 Presence of aortocoronary bypass graft: Secondary | ICD-10-CM | POA: Diagnosis not present

## 2015-11-10 DIAGNOSIS — K5010 Crohn's disease of large intestine without complications: Secondary | ICD-10-CM

## 2015-11-10 DIAGNOSIS — Z87891 Personal history of nicotine dependence: Secondary | ICD-10-CM

## 2015-11-10 DIAGNOSIS — I10 Essential (primary) hypertension: Secondary | ICD-10-CM | POA: Diagnosis not present

## 2015-11-10 DIAGNOSIS — Z23 Encounter for immunization: Secondary | ICD-10-CM | POA: Diagnosis not present

## 2015-11-10 HISTORY — DX: Atherosclerosis of aorta: I70.0

## 2015-11-10 LAB — LIPID PANEL
CHOLESTEROL: 138 mg/dL (ref 0–200)
HDL: 62.2 mg/dL (ref >39.00)
LDL Cholesterol: 64 mg/dL (ref 0–99)
NonHDL: 75.98
TRIGLYCERIDES: 60 mg/dL (ref 0.0–149.0)
Total CHOL/HDL Ratio: 2
VLDL: 12 mg/dL (ref 0.0–40.0)

## 2015-11-10 LAB — URINALYSIS
BILIRUBIN URINE: NEGATIVE
Ketones, ur: NEGATIVE
Leukocytes, UA: NEGATIVE
Nitrite: NEGATIVE
Specific Gravity, Urine: 1.015 (ref 1.000–1.030)
TOTAL PROTEIN, URINE-UPE24: NEGATIVE
URINE GLUCOSE: NEGATIVE
Urobilinogen, UA: 1 (ref 0.0–1.0)
pH: 6 (ref 5.0–8.0)

## 2015-11-10 LAB — CBC WITH DIFFERENTIAL/PLATELET
BASOS PCT: 0.3 % (ref 0.0–3.0)
Basophils Absolute: 0 10*3/uL (ref 0.0–0.1)
EOS PCT: 2.4 % (ref 0.0–5.0)
Eosinophils Absolute: 0.2 10*3/uL (ref 0.0–0.7)
HCT: 44.4 % (ref 39.0–52.0)
Hemoglobin: 15 g/dL (ref 13.0–17.0)
LYMPHS ABS: 1.7 10*3/uL (ref 0.7–4.0)
LYMPHS PCT: 25.2 % (ref 12.0–46.0)
MCHC: 33.9 g/dL (ref 30.0–36.0)
MCV: 90.2 fl (ref 78.0–100.0)
MONO ABS: 0.9 10*3/uL (ref 0.1–1.0)
Monocytes Relative: 14.2 % — ABNORMAL HIGH (ref 3.0–12.0)
NEUTROS PCT: 57.9 % (ref 43.0–77.0)
Neutro Abs: 3.8 10*3/uL (ref 1.4–7.7)
Platelets: 73 10*3/uL — ABNORMAL LOW (ref 150.0–400.0)
RBC: 4.92 Mil/uL (ref 4.22–5.81)
RDW: 15.5 % (ref 11.5–15.5)
WBC: 6.6 10*3/uL (ref 4.0–10.5)

## 2015-11-10 LAB — COMPREHENSIVE METABOLIC PANEL
ALBUMIN: 3.4 g/dL — AB (ref 3.5–5.2)
ALK PHOS: 101 U/L (ref 39–117)
ALT: 21 U/L (ref 0–53)
AST: 24 U/L (ref 0–37)
BILIRUBIN TOTAL: 2.6 mg/dL — AB (ref 0.2–1.2)
BUN: 16 mg/dL (ref 6–23)
CALCIUM: 9.6 mg/dL (ref 8.4–10.5)
CO2: 32 mEq/L (ref 19–32)
CREATININE: 1.05 mg/dL (ref 0.40–1.50)
Chloride: 101 mEq/L (ref 96–112)
GFR: 71.79 mL/min (ref >60.00)
Glucose, Bld: 94 mg/dL (ref 70–99)
Potassium: 3.8 mEq/L (ref 3.5–5.1)
SODIUM: 139 meq/L (ref 135–145)
TOTAL PROTEIN: 5.8 g/dL — AB (ref 6.0–8.3)

## 2015-11-10 LAB — VITAMIN B12: VITAMIN B 12: 1372 pg/mL — AB (ref 211–911)

## 2015-11-10 LAB — HEMOGLOBIN A1C: HEMOGLOBIN A1C: 5.6 % (ref 4.6–6.5)

## 2015-11-10 LAB — TSH: TSH: 2.4 u[IU]/mL (ref 0.35–4.50)

## 2015-11-10 LAB — VITAMIN D 25 HYDROXY (VIT D DEFICIENCY, FRACTURES): VITD: 38.52 ng/mL (ref 30.00–100.00)

## 2015-11-10 NOTE — Assessment & Plan Note (Signed)
Following with gastroenterology and doing better, as a younger man had 4 obstructions none recently. No longer follows any dietary restrictions

## 2015-11-10 NOTE — Patient Instructions (Signed)
Medication Instructions: - Your physician recommends that you continue on your current medications as directed. Please refer to the Current Medication list given to you today.  Labwork: - none ordered  Procedures/Testing: - none ordere  Follow-Up: - Your physician wants you to follow-up in: 1 year with Dr. Caryl Comes. You will receive a reminder letter in the mail two months in advance. If you don't receive a letter, please call our office to schedule the follow-up appointment.  Any Additional Special Instructions Will Be Listed Below (If Applicable).     If you need a refill on your cardiac medications before your next appointment, please call your pharmacy.

## 2015-11-10 NOTE — Patient Instructions (Signed)

## 2015-11-10 NOTE — Progress Notes (Signed)
Patient Care Team: [name], MD as PCP - General (Family Medicine) Carlena Bjornstad, MD as Consulting Physician (Cardiology) Jerene Bears, MD as Consulting Physician (Gastroenterology) Darleen Crocker, MD as Consulting Physician (Ophthalmology)   HPI  [name] is a 82 y.o. male ICD implanted for primary prevention in the setting of ischemic coronary disease with prior CABG as part of the MADIT-CRT trial.   His last catheterization in 2006 demonstrated a total right and 80% marginal. Vein graft to the diagonal was occluded and other bypasses were open; ejection fraction 2007 was about 35%  Nuclear scan Oct 2011>>scar no ischemia    The patient denies chest pain, shortness of breath, nocturnal dyspnea, orthopnea or peripheral edema.  There have been no palpitations, lightheadedness or syncope.   10/17 Cr 1.05   Recently diagnosed with pneumonia as an outpatient treated with antibiotics and steroids. Repeat x-ray today was reviewed and demonstrates "no pneumonia".  He complains of spells of diaphoresis; these have occurred in the last 2 weeks since his pneumonia.   He has not had any chest pain.     Past Medical History:  Diagnosis Date  . Acute bronchitis 09/02/2013  . Acute vascular insufficiency of intestine (HCC)    Mesenteric embolus...surgery 2003  . Anxiety   . Atrial fibrillation (HCC)    paroxysmal  . Biventricular implantable cardiac defibrillator in situ    GDT CONTAK H170-MADIT-CRT-EXPLANTED 2011 implanted defibrillator- Guidant cognis, model n119-2001 Dr. Caryl Comes (11/28/2009)  . Breast tenderness    Spironolactone was stopped and eplerinone started. Patient feels better  . CAD (coronary artery disease)    CABG 1994  /   catheterization 1996, occluded vein graft to the diagonal, other grafts patent  /   catheterization 2006, no PCI  /  nuclear, October, 2011, extensive scar anteroseptal and apical, no ischemia  . Carotid artery disease ([place])    Doppler, February,  2012, 0-39% bilateral, recommended followup 1 year  . CHF (congestive heart failure) (HCC)    chronic systolic  . Chronotropic incompetence   . Colon polyp   . Colonic polyp 2010   pathology not clear.   . Crohn's disease ([place])   . Degenerative joint disease   . Dizziness    Positional dizziness  . Edema 09/02/2013  . Edema leg    Venous Dopplers 8/13: No DVT bilaterally  . GERD (gastroesophageal reflux disease)   . Hx of CABG    1994  . Hyperlipemia   . Hypertension   . Hypertrophy of prostate with urinary obstruction and other lower urinary tract symptoms (LUTS)   . Iron deficiency anemia   . Ischemic cardiomyopathy    CABG 1994 CAD catherization 1996.[name] occluded vein graft to the diagnol, other grafts patent/  catherization 2006, no PCI/ nuclear.[name] october 2011.[name] extensive scar anteroseptal and apical.. no ischemia    . LBBB (left bundle branch block)   . Mitral regurgitation    mild.[name] echo november 2011  EF 35%...echo.. november 2009/ ef 35%.[name] echo november 2011  . Oral thrush 02/05/2015  . Polymyalgia rheumatica ([place])   . Shingles   . Sinus bradycardia   . Thrombocytopenia ([place]) 2008  . Thrombocytopenia ([place]) 11/30/2011  . Thrush   . Tremor   . Ventral hernia    multiple, wears an abd binder, reports he hs been told he was at increased riisk for surgery  . Vitamin B12 deficiency   . Warfarin anticoagulation    Coumadin  therapy for mesenteric embolus 2003    Past Surgical History:  Procedure Laterality Date  . CATARACT EXTRACTION, BILATERAL  2014  . COLONOSCOPY N/A 04/19/2013   Procedure: COLONOSCOPY;  Surgeon: Jerene Bears, MD;  Location: [facility] ENDOSCOPY;  Service: Endoscopy;  Laterality: N/A;  . CORONARY ARTERY BYPASS GRAFT  1994  . Elap w/ superior mesenteric artery embolectomy  1/03   by Dr. Jennette Banker  . ESOPHAGOGASTRODUODENOSCOPY N/A 04/18/2013   Procedure: ESOPHAGOGASTRODUODENOSCOPY (EGD);  Surgeon: Jerene Bears, MD;  Location: [facility] ENDOSCOPY;  Service:  Endoscopy;  Laterality: N/A;  . implantation of biventric cardioverter-defibrillatorr     by Dr. Lovena Le in 8/06 Guidant Contak H170-explanted 2011  . implantation of guidant cognis device     model n119-2011  . laparoscopic cholecystectomy  2002  . left inguinal hernia repair with mesh  6/08   by Dr. Betsy Pries  . PROSTATE SURGERY  08/2012   by urology in [place]  . Repair of incarcerated ventral hernia and lysis of adhesions  11/03   by Dr. Betsy Pries    Current Outpatient Prescriptions  Medication Sig Dispense Refill  . carvedilol (COREG) 6.25 MG tablet Take 1 tablet by mouth  twice a day with meals 180 tablet 0  . cholecalciferol (VITAMIN D) 1000 UNITS tablet Take 1,000 Units by mouth daily.     . folic acid (FOLVITE) 1 MG tablet Take 1 tablet by mouth daily    . furosemide (LASIX) 40 MG tablet Take 1 tablet by mouth two  times daily 180 tablet 1  . lisinopril (PRINIVIL,ZESTRIL) 10 MG tablet Take 1 tablet by mouth  daily 90 tablet 1  . pantoprazole (PROTONIX) 40 MG tablet TAKE 1 TABLET BY MOUTH  EVERY DAY AT 12 NOON 90 tablet 6  . potassium chloride SA (KLOR-CON M20) 20 MEQ tablet Take 1 tablet (20 mEq total) by mouth daily. 90 tablet 3  . simvastatin (ZOCOR) 20 MG tablet Take 1 tablet by mouth at  bedtime 90 tablet 3  . vitamin B-12 (CYANOCOBALAMIN) 1000 MCG tablet Take 1,000 mcg by mouth every other day.    . warfarin (COUMADIN) 5 MG tablet Take 1 tablet (5 mg total) by mouth as directed. 40 tablet 3   No current facility-administered medications for this visit.     No Known Allergies  Review of Systems negative except from HPI and PMH  Physical Exam BP 120/60   Pulse 62   Ht 6' (1.829 m)   Wt 191 lb (86.6 kg)   SpO2 95%   BMI 25.90 kg/m  Well developed and well nourished in no acute distress HENT normal E scleral and icterus clear Neck Supple Clear to ausculation Device pocket well healed; without hematoma or erythema.  There is no tethering Regular rate and rhythm, no  murmurs gallops or rub Soft with active bowel sounds No clubbing cyanosis  Edema Alert and oriented, grossly normal motor and sensory function Skin Warm and Dry    Assessment and  Plan Ischemic cardiomyopathy  Implantable defibrillator-[employer] The patient's device was interrogated.  The information was reviewed. No changes were made in the programming.    Congestive heart-chronic-systolic Euvolemic continue current meds   The spells of diaphoresis may be a manifestation of ischemia but I will think less likely given the close temporal association with the diagnosis of pneumonia \\no chest pain

## 2015-11-10 NOTE — Progress Notes (Signed)
Pre visit review using our clinic review tool, if applicable. No additional management support is needed unless otherwise documented below in the visit note. 

## 2015-11-11 LAB — URINE CULTURE

## 2015-11-12 ENCOUNTER — Other Ambulatory Visit: Payer: Self-pay | Admitting: Family Medicine

## 2015-11-12 DIAGNOSIS — Z8701 Personal history of pneumonia (recurrent): Secondary | ICD-10-CM

## 2015-11-12 DIAGNOSIS — I422 Other hypertrophic cardiomyopathy: Secondary | ICD-10-CM

## 2015-11-12 DIAGNOSIS — R0602 Shortness of breath: Secondary | ICD-10-CM

## 2015-11-12 DIAGNOSIS — R059 Cough, unspecified: Secondary | ICD-10-CM

## 2015-11-12 DIAGNOSIS — R05 Cough: Secondary | ICD-10-CM

## 2015-11-16 ENCOUNTER — Ambulatory Visit: Payer: Medicare Other | Admitting: *Deleted

## 2015-11-18 NOTE — Assessment & Plan Note (Signed)
Encouraged increased protein in diet

## 2015-11-18 NOTE — Assessment & Plan Note (Signed)
Well controlled, no changes to meds. Encouraged heart healthy diet such as the DASH diet and exercise as tolerated.  °

## 2015-11-18 NOTE — Progress Notes (Signed)
Patient ID: [name], male   DOB: October 04, 1931, 82 y.o.   MRN: [id number]   Subjective:    Patient ID: [name], male    DOB: 04-28-1931, 82 y.o.   MRN: [id number]  Chief Complaint  Patient presents with  . Follow-up    HPI Patient is in today for annual preventative exam. He feels well today and he celebrated his 59th wedding anniversary yesterday. He had 2 episodes recently of feeling weak and shaky mostly when he has skipped a meal. Had recently had a steroid shot and antibiotics. Is endorsing some fatigue also but mostly going well. Well controlled, no changes to meds. Encouraged heart healthy diet such as the DASH diet and exercise as tolerated. No recent flare in his Crohn's disease. Denies CP/palp/SOB/HA/congestion/fevers/GI or GU c/o. Taking meds as prescribed  Past Medical History:  Diagnosis Date  . Acute bronchitis 09/02/2013  . Acute vascular insufficiency of intestine (HCC)    Mesenteric embolus...surgery 2003  . Anxiety   . Atrial fibrillation (HCC)    paroxysmal  . Biventricular implantable cardiac defibrillator in situ    GDT CONTAK H170-MADIT-CRT-EXPLANTED 2011 implanted defibrillator- Guidant cognis, model n119-2001 Dr. Caryl Comes (11/28/2009)  . Breast tenderness    Spironolactone was stopped and eplerinone started. Patient feels better  . CAD (coronary artery disease)    CABG 1994  /   catheterization 1996, occluded vein graft to the diagonal, other grafts patent  /   catheterization 2006, no PCI  /  nuclear, October, 2011, extensive scar anteroseptal and apical, no ischemia  . Carotid artery disease ([place])    Doppler, February,  2012, 0-39% bilateral, recommended followup 1 year  . CHF (congestive heart failure) (HCC)    chronic systolic  . Chronotropic incompetence   . Colon polyp   . Colonic polyp 2010   pathology not clear.   . Crohn's disease ([place])   . Degenerative joint disease   . Dizziness    Positional dizziness  . Edema 09/02/2013  . Edema leg    Venous Dopplers 8/13: No DVT bilaterally  . GERD (gastroesophageal reflux disease)   . Hx of CABG    1994  . Hyperlipemia   . Hypertension   . Hypertrophy of prostate with urinary obstruction and other lower urinary tract symptoms (LUTS)   . Iron deficiency anemia   . Ischemic cardiomyopathy    CABG 1994 CAD catherization 1996.[name] occluded vein graft to the diagnol, other grafts patent/  catherization 2006, no PCI/ nuclear.[name] october 2011.[name] extensive scar anteroseptal and apical.. no ischemia    . LBBB (left bundle branch block)   . Mitral regurgitation    mild.[name] echo november 2011  EF 35%...echo.. november 2009/ ef 35%.[name] echo november 2011  . Oral thrush 02/05/2015  . Polymyalgia rheumatica ([place])   . Shingles   . Sinus bradycardia   . Thrombocytopenia ([place]) 2008  . Thrombocytopenia ([place]) 11/30/2011  . Thrush   . Tremor   . Ventral hernia    multiple, wears an abd binder, reports he hs been told he was at increased riisk for surgery  . Vitamin B12 deficiency   . Warfarin anticoagulation    Coumadin therapy for mesenteric embolus 2003    Past Surgical History:  Procedure Laterality Date  . CATARACT EXTRACTION, BILATERAL  2014  . COLONOSCOPY N/A 04/19/2013   Procedure: COLONOSCOPY;  Surgeon: Jerene Bears, MD;  [facility] END[facility]COPY;  Service: Endoscopy;  Laterality: N/A;  . CORONARY ARTERY BYPASS GRAFT  1994  . Elap w/ superior mesenteric artery embolectomy  1/03   by Dr. Jennette Banker  . ESOPHAGOGASTRODUODENOSCOPY N/A 04/18/2013   Procedure: ESOPHAGOGASTRODUODENOSCOPY (EGD);  Surgeon: Jerene Bears, MD;  Location: [facility] ENDOSCOPY;  Service: Endoscopy;  Laterality: N/A;  . implantation of biventric cardioverter-defibrillatorr     by Dr. Lovena Le in 8/06 Guidant Contak H170-explanted 2011  . implantation of guidant cognis device     model n119-2011  . laparoscopic cholecystectomy  2002  . left inguinal hernia repair with mesh  6/08   by Dr. Betsy Pries  . PROSTATE SURGERY  08/2012   by urology in  [place]  . Repair of incarcerated ventral hernia and lysis of adhesions  11/03   by Dr. Betsy Pries    Family History  Problem Relation Age of Onset  . Heart disease Mother   . Tremor Sister   . Heart failure Sister   . Heart disease Father 30  . Hypertension Father   . Appendicitis Brother     ruptured  . Diabetes Son   . Heart disease Sister   . Hypertension Sister   . Dementia Sister   . Heart disease Sister   . Hypertension Brother   . Kidney disease Brother   . Myasthenia gravis Brother   . Cirrhosis Brother   . Leukemia Brother   . Hypertension    . Hyperlipidemia    . Heart disease Sister   . Edema Sister     pedal edema    Social History   Social History  . Marital status: Married    Spouse name: Kennyth Lose  . Number of children: 3  . Years of education: N/A   Occupational History  . bull dozer driver   . farmer Retired  . logger    Social History Main Topics  . Smoking status: Former Smoker    Packs/day: 1.00    Years: 20.00    Types: Cigarettes    Quit date: 01/31/1973  . Smokeless tobacco: Never Used  . Alcohol use 0.0 oz/week     Comment: occasional  . Drug use: No  . Sexual activity: No     Comment: lives with wife, no dietary restrictions   Other Topics Concern  . Not on file   Social History Narrative   Lives in [place], [place] with wife.     Outpatient Medications Prior to Visit  Medication Sig Dispense Refill  . carvedilol (COREG) 6.25 MG tablet Take 1 tablet by mouth  twice a day with meals 180 tablet 0  . cholecalciferol (VITAMIN D) 1000 UNITS tablet Take 1,000 Units by mouth daily.     . folic acid (FOLVITE) 1 MG tablet Take 1 tablet by mouth daily    . furosemide (LASIX) 40 MG tablet Take 1 tablet by mouth two  times daily 180 tablet 1  . lisinopril (PRINIVIL,ZESTRIL) 10 MG tablet Take 1 tablet by mouth  daily 90 tablet 1  . pantoprazole (PROTONIX) 40 MG tablet TAKE 1 TABLET BY MOUTH  EVERY DAY AT 12 NOON 90 tablet 6  . potassium  chloride SA (KLOR-CON M20) 20 MEQ tablet Take 1 tablet (20 mEq total) by mouth daily. 90 tablet 3  . simvastatin (ZOCOR) 20 MG tablet Take 1 tablet by mouth at  bedtime 90 tablet 3  . vitamin B-12 (CYANOCOBALAMIN) 1000 MCG tablet Take 1,000 mcg by mouth every other day.    . warfarin (COUMADIN) 5 MG tablet Take 1 tablet (5 mg total) by mouth as directed.  40 tablet 3  . nystatin (MYCOSTATIN) 100000 UNIT/ML suspension Take 5 mLs (500,000 Units total) by mouth 4 (four) times daily. 240 mL 1   No facility-administered medications prior to visit.     No Known Allergies  Review of Systems  Constitutional: Positive for malaise/fatigue. Negative for fever.  HENT: Negative for congestion.   Eyes: Negative for blurred vision.  Respiratory: Negative for shortness of breath.   Cardiovascular: Negative for chest pain, palpitations and leg swelling.  Gastrointestinal: Negative for abdominal pain, blood in stool and nausea.  Genitourinary: Negative for dysuria and frequency.  Musculoskeletal: Negative for falls.  Skin: Negative for rash.  Neurological: Negative for dizziness, loss of consciousness and headaches.  Endo/Heme/Allergies: Negative for environmental allergies.  Psychiatric/Behavioral: Negative for depression. The patient is not nervous/anxious.        Objective:    Physical Exam  Constitutional: He is oriented to person, place, and time. He appears well-developed and well-nourished. No distress.  HENT:  Head: Normocephalic and atraumatic.  Eyes: Conjunctivae are normal.  Neck: Neck supple. No thyromegaly present.  Cardiovascular: Normal rate and regular rhythm.   Murmur heard. Pulmonary/Chest: Effort normal and breath sounds normal. No respiratory distress. He has no wheezes.  Abdominal: Soft. Bowel sounds are normal. He exhibits no mass. There is no tenderness.  Musculoskeletal: He exhibits no edema.  Lymphadenopathy:    He has no cervical adenopathy.  Neurological: He is alert  and oriented to person, place, and time.  Skin: Skin is warm and dry.  Psychiatric: He has a normal mood and affect. His behavior is normal.    BP 122/72 (BP Location: Left Arm, Patient Position: Sitting, Cuff Size: Normal)   Pulse 72   Temp 97.6 F (36.4 C) (Oral)   Ht 6' (1.829 m)   Wt 190 lb (86.2 kg)   BMI 25.77 kg/m  Wt Readings from Last 3 Encounters:  11/10/15 191 lb (86.6 kg)  11/10/15 190 lb (86.2 kg)  10/22/15 194 lb (88 kg)     Lab Results  Component Value Date   WBC 6.6 11/10/2015   HGB 15.0 11/10/2015   HCT 44.4 11/10/2015   PLT 73.0 (L) 11/10/2015   GLUCOSE 94 11/10/2015   CHOL 138 11/10/2015   TRIG 60.0 11/10/2015   HDL 62.20 11/10/2015   LDLDIRECT 81.6 12/30/2013   LDLCALC 64 11/10/2015   ALT 21 11/10/2015   AST 24 11/10/2015   NA 139 11/10/2015   K 3.8 11/10/2015   CL 101 11/10/2015   CREATININE 1.05 11/10/2015   BUN 16 11/10/2015   CO2 32 11/10/2015   TSH 2.40 11/10/2015   PSA 2.93 11/24/2010   INR 2.9 10/22/2015   HGBA1C 5.6 11/10/2015    Lab Results  Component Value Date   TSH 2.40 11/10/2015   Lab Results  Component Value Date   WBC 6.6 11/10/2015   HGB 15.0 11/10/2015   HCT 44.4 11/10/2015   MCV 90.2 11/10/2015   PLT 73.0 (L) 11/10/2015   Lab Results  Component Value Date   NA 139 11/10/2015   K 3.8 11/10/2015   CO2 32 11/10/2015   GLUCOSE 94 11/10/2015   BUN 16 11/10/2015   CREATININE 1.05 11/10/2015   BILITOT 2.6 (H) 11/10/2015   ALKPHOS 101 11/10/2015   AST 24 11/10/2015   ALT 21 11/10/2015   PROT 5.8 (L) 11/10/2015   ALBUMIN 3.4 (L) 11/10/2015   CALCIUM 9.6 11/10/2015   GFR 71.79 11/10/2015   Lab Results  Component  Value Date   CHOL 138 11/10/2015   Lab Results  Component Value Date   HDL 62.20 11/10/2015   Lab Results  Component Value Date   LDLCALC 64 11/10/2015   Lab Results  Component Value Date   TRIG 60.0 11/10/2015   Lab Results  Component Value Date   CHOLHDL 2 11/10/2015   Lab Results    Component Value Date   HGBA1C 5.6 11/10/2015       Assessment & Plan:   Problem List Items Addressed This Visit    Vitamin B 12 deficiency   Relevant Orders   Vitamin B12 (Completed)   Vitamin D deficiency - Primary   Relevant Orders   VITAMIN D 25 Hydroxy (Vit-D Deficiency, Fractures) (Completed)   CAD (coronary artery disease)   Hypertension    Well controlled, no changes to meds. Encouraged heart healthy diet such as the DASH diet and exercise as tolerated.       Relevant Orders   TSH (Completed)   Lipid panel (Completed)   Comprehensive metabolic panel (Completed)   CBC w/Diff (Completed)   Hyperlipemia    Tolerating statin, encouraged heart healthy diet, avoid trans fats, minimize simple carbs and saturated fats. Increase exercise as tolerated      Crohn's disease of large intestine [facility])    Following with gastroenterology and doing better, as a younger man had 4 obstructions none recently. No longer follows any dietary restrictions      Relevant Orders   Vitamin B12 (Completed)   OSA (obstructive sleep apnea)    Is using CPAP      Acute blood loss anemia   Relevant Orders   CBC w/Diff (Completed)   Hypoalbuminemia    Encouraged increased protein in diet      Aortic atherosclerosis (HCC)   Relevant Orders   Comprehensive metabolic panel (Completed)   CBC w/Diff (Completed)   US Aorta    Other Visit Diagnoses    Encounter for immunization       Relevant Orders   Flu vaccine HIGH DOSE PF (Completed)   Hyperglycemia       Relevant Orders   Hemoglobin A1c (Completed)   Urinary frequency       Relevant Orders   Urinalysis (Completed)   Urine culture (Completed)   H/O tobacco use, presenting hazards to health       Relevant Orders   US Aorta   Abdominal aortic aneurysm (AAA) without rupture ([place])       Relevant Orders   US Aorta   Aspiration pneumonia of both lower lobes, unspecified aspiration pneumonia type ([place])       Community acquired  pneumonia, unspecified laterality       Relevant Orders   DG Chest 2 View (Completed)      I have discontinued Mr. Lei's nystatin. I am also having him maintain his cholecalciferol, vitamin Z-61, folic acid, simvastatin, potassium chloride SA, lisinopril, furosemide, warfarin, carvedilol, and pantoprazole.  No orders of the defined types were placed in this encounter.    Penni Homans, MD

## 2015-11-18 NOTE — Assessment & Plan Note (Signed)
Is using CPAP

## 2015-11-18 NOTE — Assessment & Plan Note (Signed)
Tolerating statin, encouraged heart healthy diet, avoid trans fats, minimize simple carbs and saturated fats. Increase exercise as tolerated 

## 2015-11-19 ENCOUNTER — Ambulatory Visit (INDEPENDENT_AMBULATORY_CARE_PROVIDER_SITE_OTHER): Payer: Medicare Other | Admitting: *Deleted

## 2015-11-19 ENCOUNTER — Ambulatory Visit (HOSPITAL_BASED_OUTPATIENT_CLINIC_OR_DEPARTMENT_OTHER)
Admission: RE | Admit: 2015-11-19 | Discharge: 2015-11-19 | Disposition: A | Discharge status: Home / Self Care | Payer: Medicare Other | Source: Ambulatory Visit | Attending: Family Medicine | Admitting: Family Medicine

## 2015-11-19 ENCOUNTER — Other Ambulatory Visit (HOSPITAL_BASED_OUTPATIENT_CLINIC_OR_DEPARTMENT_OTHER): Payer: Medicare Other

## 2015-11-19 DIAGNOSIS — I422 Other hypertrophic cardiomyopathy: Secondary | ICD-10-CM | POA: Diagnosis not present

## 2015-11-19 DIAGNOSIS — R05 Cough: Secondary | ICD-10-CM

## 2015-11-19 DIAGNOSIS — K766 Portal hypertension: Secondary | ICD-10-CM | POA: Insufficient documentation

## 2015-11-19 DIAGNOSIS — K55069 Acute infarction of intestine, part and extent unspecified: Secondary | ICD-10-CM | POA: Diagnosis not present

## 2015-11-19 DIAGNOSIS — R0602 Shortness of breath: Secondary | ICD-10-CM

## 2015-11-19 DIAGNOSIS — K439 Ventral hernia without obstruction or gangrene: Secondary | ICD-10-CM | POA: Insufficient documentation

## 2015-11-19 DIAGNOSIS — I714 Abdominal aortic aneurysm, without rupture, unspecified: Secondary | ICD-10-CM

## 2015-11-19 DIAGNOSIS — Z87891 Personal history of nicotine dependence: Secondary | ICD-10-CM | POA: Diagnosis not present

## 2015-11-19 DIAGNOSIS — Z8701 Personal history of pneumonia (recurrent): Secondary | ICD-10-CM | POA: Insufficient documentation

## 2015-11-19 DIAGNOSIS — K7460 Unspecified cirrhosis of liver: Secondary | ICD-10-CM | POA: Insufficient documentation

## 2015-11-19 DIAGNOSIS — I2510 Atherosclerotic heart disease of native coronary artery without angina pectoris: Secondary | ICD-10-CM | POA: Insufficient documentation

## 2015-11-19 DIAGNOSIS — I700 Atherosclerosis of aorta: Secondary | ICD-10-CM | POA: Diagnosis not present

## 2015-11-19 DIAGNOSIS — Z95810 Presence of automatic (implantable) cardiac defibrillator: Secondary | ICD-10-CM | POA: Insufficient documentation

## 2015-11-19 DIAGNOSIS — Z951 Presence of aortocoronary bypass graft: Secondary | ICD-10-CM | POA: Insufficient documentation

## 2015-11-19 DIAGNOSIS — J189 Pneumonia, unspecified organism: Secondary | ICD-10-CM | POA: Diagnosis not present

## 2015-11-19 DIAGNOSIS — R059 Cough, unspecified: Secondary | ICD-10-CM

## 2015-11-19 LAB — POCT INR: INR: 2.2

## 2015-11-20 ENCOUNTER — Other Ambulatory Visit: Payer: Self-pay

## 2015-11-20 ENCOUNTER — Telehealth: Payer: Self-pay

## 2015-11-20 DIAGNOSIS — I714 Abdominal aortic aneurysm, without rupture, unspecified: Secondary | ICD-10-CM

## 2015-11-20 NOTE — Telephone Encounter (Signed)
Patient has been notified about lab results

## 2015-12-02 ENCOUNTER — Encounter: Payer: Self-pay | Admitting: Vascular Surgery

## 2015-12-04 ENCOUNTER — Encounter: Payer: Self-pay | Admitting: Vascular Surgery

## 2015-12-04 ENCOUNTER — Ambulatory Visit (INDEPENDENT_AMBULATORY_CARE_PROVIDER_SITE_OTHER): Payer: Medicare Other | Admitting: Vascular Surgery

## 2015-12-04 VITALS — BP 123/75 | HR 59 | Temp 97.9°F | Resp 18 | Ht 72.0 in | Wt 190.0 lb

## 2015-12-04 DIAGNOSIS — I714 Abdominal aortic aneurysm, without rupture, unspecified: Secondary | ICD-10-CM

## 2015-12-04 DIAGNOSIS — I255 Ischemic cardiomyopathy: Secondary | ICD-10-CM | POA: Diagnosis not present

## 2015-12-04 NOTE — Progress Notes (Signed)
Patient ID: [name], male   DOB: December 09, 1931, 82 y.o.   MRN: [id number]  Reason for Consult: New Evaluation (AAA)   Referred by [name], MD  Subjective:     HPI:  [name] is a 82 y.o. male with history of Crohn's disease status post multiple abdominal operations including one for mesenteric embolus notably was performed by Dr. Kellie Simmering in 2003. He now has significant abdominal wall herniation from his multiple surgeries. He is also maintained on Coumadin for atrial fibrillation. He now presents for evaluation of his aortic aneurysm. He has a previous smoker but he quit at a very young age. He does not have a significant family history of aortic aneurysm. He does not have new back or abdominal pain. He has no complaints regarding today's visit.  Past Medical History:  Diagnosis Date  . AAA (abdominal aortic aneurysm) ([place])   . Acute bronchitis 09/02/2013  . Acute vascular insufficiency of intestine (HCC)    Mesenteric embolus...surgery 2003  . Anxiety   . Atrial fibrillation (HCC)    paroxysmal  . Biventricular implantable cardiac defibrillator in situ    GDT CONTAK H170-MADIT-CRT-EXPLANTED 2011 implanted defibrillator- Guidant cognis, model n119-2001 Dr. Caryl Comes (11/28/2009)  . Breast tenderness    Spironolactone was stopped and eplerinone started. Patient feels better  . CAD (coronary artery disease)    CABG 1994  /   catheterization 1996, occluded vein graft to the diagonal, other grafts patent  /   catheterization 2006, no PCI  /  nuclear, October, 2011, extensive scar anteroseptal and apical, no ischemia  . Carotid artery disease ([place])    Doppler, February,  2012, 0-39% bilateral, recommended followup 1 year  . CHF (congestive heart failure) (HCC)    chronic systolic  . Chronotropic incompetence   . Colon polyp   . Colonic polyp 2010   pathology not clear.   . Crohn's disease ([place])   . Degenerative joint disease   . Dizziness    Positional dizziness  .  Edema 09/02/2013  . Edema leg    Venous Dopplers 8/13: No DVT bilaterally  . GERD (gastroesophageal reflux disease)   . Hx of CABG    1994  . Hyperlipemia   . Hypertension   . Hypertrophy of prostate with urinary obstruction and other lower urinary tract symptoms (LUTS)   . Iron deficiency anemia   . Ischemic cardiomyopathy    CABG 1994 CAD catherization 1996.[name] occluded vein graft to the diagnol, other grafts patent/  catherization 2006, no PCI/ nuclear.[name] october 2011.[name] extensive scar anteroseptal and apical.. no ischemia    . LBBB (left bundle branch block)   . Mitral regurgitation    mild.[name] echo november 2011  EF 35%...echo.. november 2009/ ef 35%.[name] echo november 2011  . Oral thrush 02/05/2015  . Polymyalgia rheumatica ([place])   . Shingles   . Sinus bradycardia   . Thrombocytopenia ([place]) 2008  . Thrombocytopenia ([place]) 11/30/2011  . Thrush   . Tremor   . Ventral hernia    multiple, wears an abd binder, reports he hs been told he was at increased riisk for surgery  . Vitamin B12 deficiency   . Warfarin anticoagulation    Coumadin therapy for mesenteric embolus 2003   Family History  Problem Relation Age of Onset  . Heart disease Mother   . Tremor Sister   . Heart failure Sister   . Heart disease Father 53  . Hypertension Father   . Appendicitis Brother  ruptured  . Diabetes Son   . Heart disease Sister   . Hypertension Sister   . Dementia Sister   . Heart disease Sister   . Hypertension Brother   . Kidney disease Brother   . Myasthenia gravis Brother   . Cirrhosis Brother   . Leukemia Brother   . Hypertension    . Hyperlipidemia    . Heart disease Sister   . Edema Sister     pedal edema   Past Surgical History:  Procedure Laterality Date  . CATARACT EXTRACTION, BILATERAL  2014  . COLONOSCOPY N/A 04/19/2013   Procedure: COLONOSCOPY;  Surgeon: Jerene Bears, MD;  Location: [facility] ENDOSCOPY;  Service: Endoscopy;  Laterality: N/A;  . CORONARY ARTERY BYPASS GRAFT  1994    . Elap w/ superior mesenteric artery embolectomy  1/03   by Dr. Jennette Banker  . ESOPHAGOGASTRODUODENOSCOPY N/A 04/18/2013   Procedure: ESOPHAGOGASTRODUODENOSCOPY (EGD);  Surgeon: Jerene Bears, MD;  Location: [facility] ENDOSCOPY;  Service: Endoscopy;  Laterality: N/A;  . implantation of biventric cardioverter-defibrillatorr     by Dr. Lovena Le in 8/06 Guidant Contak H170-explanted 2011  . implantation of guidant cognis device     model n119-2011  . laparoscopic cholecystectomy  2002  . left inguinal hernia repair with mesh  6/08   by Dr. Betsy Pries  . PROSTATE SURGERY  08/2012   by urology in [place]  . Repair of incarcerated ventral hernia and lysis of adhesions  11/03   by Dr. Ray Church Social History:  Social History  Substance Use Topics  . Smoking status: Former Smoker    Packs/day: 1.00    Years: 20.00    Types: Cigarettes    Quit date: 01/31/1973  . Smokeless tobacco: Never Used  . Alcohol use 0.0 oz/week     Comment: occasional    No Known Allergies  Current Outpatient Prescriptions  Medication Sig Dispense Refill  . carvedilol (COREG) 6.25 MG tablet Take 1 tablet by mouth  twice a day with meals 180 tablet 0  . cholecalciferol (VITAMIN D) 1000 UNITS tablet Take 1,000 Units by mouth daily.     . furosemide (LASIX) 40 MG tablet Take 1 tablet by mouth two  times daily 180 tablet 1  . lisinopril (PRINIVIL,ZESTRIL) 10 MG tablet Take 1 tablet by mouth  daily 90 tablet 1  . simvastatin (ZOCOR) 20 MG tablet Take 1 tablet by mouth at  bedtime 90 tablet 3  . warfarin (COUMADIN) 5 MG tablet Take 1 tablet (5 mg total) by mouth as directed. 40 tablet 3  . folic acid (FOLVITE) 1 MG tablet Take 1 tablet by mouth daily    . pantoprazole (PROTONIX) 40 MG tablet TAKE 1 TABLET BY MOUTH  EVERY DAY AT 12 NOON (Patient not taking: Reported on 12/04/2015) 90 tablet 6  . potassium chloride SA (KLOR-CON M20) 20 MEQ tablet Take 1 tablet (20 mEq total) by mouth daily. 90 tablet 3  . vitamin B-12  (CYANOCOBALAMIN) 1000 MCG tablet Take 1,000 mcg by mouth every other day.     No current facility-administered medications for this visit.     Review of Systems  Constitutional:  Constitutional negative. Eyes: Eyes negative.  Respiratory: Respiratory negative.  Cardiovascular: Positive for irregular heartbeat.  GI: Gastrointestinal negative.  GU: Genitourinary negative. Musculoskeletal: Musculoskeletal negative.  Skin: Skin negative.  Neurological: Neurological negative. Hematologic: Hematologic/lymphatic negative.  Psychiatric: Psychiatric negative.        Objective:  Objective   Vitals:  12/04/15 1535  BP: 123/75  Pulse: (!) 59  Resp: 18  Temp: 97.9 F (36.6 C)  SpO2: 96%  Weight: 190 lb (86.2 kg)  Height: 6' (1.829 m)   Body mass index is 25.77 kg/m.  Physical Exam  Constitutional: He is oriented to person, place, and time. He appears well-nourished.  HENT:  Head: Atraumatic.  Eyes: EOM are normal.  Neck: Neck supple. No thyromegaly present.  Cardiovascular: Normal rate.   Pulses:      Carotid pulses are 2+ on the right side, and 2+ on the left side.      Radial pulses are 2+ on the right side, and 2+ on the left side.       Femoral pulses are 2+ on the right side, and 2+ on the left side.      Popliteal pulses are 2+ on the right side, and 2+ on the left side.  Pulmonary/Chest: Effort normal.  Abdominal: Soft.  Musculoskeletal: He exhibits no edema.  Lymphadenopathy:    He has no cervical adenopathy.  Neurological: He is alert and oriented to person, place, and time.  Skin: Skin is warm and dry.  Psychiatric: He has a normal mood and affect. His behavior is normal. Judgment and thought content normal.    Data: Aneurysm measuring 4.2 cm infrarenal aorta. This is enlarged from 3 cm CT scan 2015.     Assessment/Plan:     82 year old white male history Crohn's disease and multiple abdominal operations including one for mesenteric embolus. Now presents  for evaluation of abdominal aortic aneurysm. Currently this is 4.2 cm by duplex and on previous CT scans had a saccular component. Certainly does not meet size criteria for repair at this time so we will have him follow up in 6 months with CT scan for further evaluation. I discussed the risk of rupture versus the risk of surgery. I also discussed the risk to family members including one son who is a dedicated smoker and is 37 years old. He can see [place] sooner should he have new onset back or abdominal pain otherwise we will reevaluate with CT scan in 6 months.     Waynetta Sandy MD [facility]

## 2015-12-06 ENCOUNTER — Other Ambulatory Visit: Payer: Self-pay | Admitting: Cardiology

## 2015-12-06 ENCOUNTER — Other Ambulatory Visit: Payer: Self-pay | Admitting: Family Medicine

## 2015-12-07 DIAGNOSIS — Z85828 Personal history of other malignant neoplasm of skin: Secondary | ICD-10-CM | POA: Diagnosis not present

## 2015-12-07 DIAGNOSIS — L570 Actinic keratosis: Secondary | ICD-10-CM | POA: Diagnosis not present

## 2015-12-07 DIAGNOSIS — L739 Follicular disorder, unspecified: Secondary | ICD-10-CM | POA: Diagnosis not present

## 2015-12-11 LAB — CUP PACEART INCLINIC DEVICE CHECK
Date Time Interrogation Session: 20171010040000
HIGH POWER IMPEDANCE MEASURED VALUE: 41 Ohm
HighPow Impedance: 60 Ohm
Implantable Lead Implant Date: 20060808
Lead Channel Impedance Value: 473 Ohm
Lead Channel Impedance Value: 541 Ohm
Lead Channel Pacing Threshold Amplitude: 0.7 V
Lead Channel Pacing Threshold Pulse Width: 0.4 ms
Lead Channel Sensing Intrinsic Amplitude: 25 mV
Lead Channel Setting Pacing Amplitude: 2.3 V
Lead Channel Setting Pacing Pulse Width: 0.4 ms
Lead Channel Setting Sensing Sensitivity: 1 mV
MDC IDC LEAD IMPLANT DT: 20060808
MDC IDC LEAD IMPLANT DT: 20060911
MDC IDC LEAD LOCATION: 753858
MDC IDC LEAD LOCATION: 753859
MDC IDC LEAD LOCATION: 753860
MDC IDC LEAD MODEL: 158
MDC IDC LEAD MODEL: 4087
MDC IDC LEAD SERIAL: 165392
MDC IDC LEAD SERIAL: 261368
MDC IDC MSMT LEADCHNL LV IMPEDANCE VALUE: 445 Ohm
MDC IDC MSMT LEADCHNL LV PACING THRESHOLD PULSEWIDTH: 0.8 ms
MDC IDC MSMT LEADCHNL RA PACING THRESHOLD AMPLITUDE: 1.1 V
MDC IDC MSMT LEADCHNL RA PACING THRESHOLD PULSEWIDTH: 0.4 ms
MDC IDC MSMT LEADCHNL RA SENSING INTR AMPL: 5.8 mV
MDC IDC MSMT LEADCHNL RV PACING THRESHOLD AMPLITUDE: 1 V
MDC IDC MSMT LEADCHNL RV SENSING INTR AMPL: 18.2 mV
MDC IDC PG IMPLANT DT: 20111028
MDC IDC SET LEADCHNL LV PACING AMPLITUDE: 2 V
MDC IDC SET LEADCHNL LV PACING PULSEWIDTH: 0.8 ms
MDC IDC SET LEADCHNL RV PACING AMPLITUDE: 2.4 V
MDC IDC SET LEADCHNL RV SENSING SENSITIVITY: 0.6 mV
Pulse Gen Serial Number: 490477

## 2015-12-15 DIAGNOSIS — N3000 Acute cystitis without hematuria: Secondary | ICD-10-CM | POA: Diagnosis not present

## 2015-12-15 DIAGNOSIS — N400 Enlarged prostate without lower urinary tract symptoms: Secondary | ICD-10-CM | POA: Diagnosis not present

## 2015-12-15 DIAGNOSIS — N401 Enlarged prostate with lower urinary tract symptoms: Secondary | ICD-10-CM | POA: Diagnosis not present

## 2015-12-19 ENCOUNTER — Other Ambulatory Visit: Payer: Self-pay | Admitting: Cardiology

## 2015-12-22 NOTE — Addendum Note (Signed)
Addended by: Lianne Cure A on: 12/22/2015 09:51 AM   Modules accepted: Orders

## 2015-12-31 ENCOUNTER — Ambulatory Visit (INDEPENDENT_AMBULATORY_CARE_PROVIDER_SITE_OTHER): Payer: Medicare Other | Admitting: *Deleted

## 2015-12-31 DIAGNOSIS — K55069 Acute infarction of intestine, part and extent unspecified: Secondary | ICD-10-CM | POA: Diagnosis not present

## 2015-12-31 DIAGNOSIS — I255 Ischemic cardiomyopathy: Secondary | ICD-10-CM | POA: Diagnosis not present

## 2015-12-31 LAB — POCT INR: INR: 2.8

## 2016-01-04 ENCOUNTER — Ambulatory Visit (INDEPENDENT_AMBULATORY_CARE_PROVIDER_SITE_OTHER): Payer: Medicare Other

## 2016-01-04 ENCOUNTER — Ambulatory Visit (INDEPENDENT_AMBULATORY_CARE_PROVIDER_SITE_OTHER): Payer: Medicare Other | Admitting: Family Medicine

## 2016-01-04 ENCOUNTER — Telehealth: Payer: Self-pay

## 2016-01-04 VITALS — BP 124/60 | HR 61 | Temp 98.1°F | Wt 198.0 lb

## 2016-01-04 DIAGNOSIS — D696 Thrombocytopenia, unspecified: Secondary | ICD-10-CM

## 2016-01-04 DIAGNOSIS — I700 Atherosclerosis of aorta: Secondary | ICD-10-CM

## 2016-01-04 DIAGNOSIS — I10 Essential (primary) hypertension: Secondary | ICD-10-CM

## 2016-01-04 DIAGNOSIS — Z95810 Presence of automatic (implantable) cardiac defibrillator: Secondary | ICD-10-CM

## 2016-01-04 DIAGNOSIS — D62 Acute posthemorrhagic anemia: Secondary | ICD-10-CM

## 2016-01-04 DIAGNOSIS — K5010 Crohn's disease of large intestine without complications: Secondary | ICD-10-CM

## 2016-01-04 DIAGNOSIS — I5022 Chronic systolic (congestive) heart failure: Secondary | ICD-10-CM

## 2016-01-04 DIAGNOSIS — K7460 Unspecified cirrhosis of liver: Secondary | ICD-10-CM

## 2016-01-04 DIAGNOSIS — E782 Mixed hyperlipidemia: Secondary | ICD-10-CM

## 2016-01-04 DIAGNOSIS — E559 Vitamin D deficiency, unspecified: Secondary | ICD-10-CM

## 2016-01-04 DIAGNOSIS — I255 Ischemic cardiomyopathy: Secondary | ICD-10-CM

## 2016-01-04 DIAGNOSIS — M353 Polymyalgia rheumatica: Secondary | ICD-10-CM

## 2016-01-04 LAB — CBC
HCT: 43.8 % (ref 39.0–52.0)
Hemoglobin: 14.9 g/dL (ref 13.0–17.0)
MCHC: 34 g/dL (ref 30.0–36.0)
MCV: 89.6 fl (ref 78.0–100.0)
Platelets: 91 10*3/uL — ABNORMAL LOW (ref 150.0–400.0)
RBC: 4.89 Mil/uL (ref 4.22–5.81)
RDW: 16 % — AB (ref 11.5–15.5)
WBC: 6.5 10*3/uL (ref 4.0–10.5)

## 2016-01-04 LAB — COMPREHENSIVE METABOLIC PANEL
ALT: 19 U/L (ref 0–53)
AST: 25 U/L (ref 0–37)
Albumin: 3.4 g/dL — ABNORMAL LOW (ref 3.5–5.2)
Alkaline Phosphatase: 82 U/L (ref 39–117)
BUN: 9 mg/dL (ref 6–23)
CALCIUM: 9.1 mg/dL (ref 8.4–10.5)
CHLORIDE: 102 meq/L (ref 96–112)
CO2: 33 meq/L — AB (ref 19–32)
CREATININE: 1.13 mg/dL (ref 0.40–1.50)
GFR: 65.93 mL/min (ref >60.00)
GLUCOSE: 123 mg/dL — AB (ref 70–99)
Potassium: 4 mEq/L (ref 3.5–5.1)
SODIUM: 139 meq/L (ref 135–145)
Total Bilirubin: 3.6 mg/dL — ABNORMAL HIGH (ref 0.2–1.2)
Total Protein: 5.6 g/dL — ABNORMAL LOW (ref 6.0–8.3)

## 2016-01-04 NOTE — Progress Notes (Signed)
Pre visit review using our clinic review tool, if applicable. No additional management support is needed unless otherwise documented below in the visit note. 

## 2016-01-04 NOTE — Assessment & Plan Note (Signed)
Liver functions checked today, no concerning symptoms

## 2016-01-04 NOTE — Telephone Encounter (Signed)
Remote ICM transmission received.  Attempted patient call and left message to return call.   

## 2016-01-04 NOTE — Progress Notes (Signed)
EPIC Encounter for ICM Monitoring  Patient Name: [name] is a 82 y.o. male Date: 01/04/2016 Primary Care Physican: Penni Homans, MD Primary Cardiologist:[place] Electrophysiologist: Caryl Comes Dry Weight:    unknown Right Ventricular Pacing 95% and Left Ventricular Pacing 99% Most Recent Daily Measurement (Jan 03, 2016) Implanted Device Measures Respiratory Rate 22 rpm Activity Level 1.3 hour(s)  Total Time in AT/AF 10.8 hour(s)   Attempted ICM call and unable to reach. Left message to return call.  Transmission reviewed.   Latitude respiratory rate was 22 and activity level decreased to 1.3hours   Follow-up plan: ICM clinic phone appointment on 02/10/2016.  Copy of ICM check sent to primary cardiologist and device physician.   ICM trend: 01/04/2016     Rosalene Billings, RN 01/04/2016 10:30 AM

## 2016-01-04 NOTE — Assessment & Plan Note (Signed)
No recent outbreaks no pain. Responded to steroids.

## 2016-01-04 NOTE — Assessment & Plan Note (Signed)
Has been seen by vascular surgeon now with follow up in 6 months.

## 2016-01-04 NOTE — Assessment & Plan Note (Signed)
Tolerating statin, encouraged heart healthy diet, avoid trans fats, minimize simple carbs and saturated fats. Increase exercise as tolerated 

## 2016-01-04 NOTE — Progress Notes (Signed)
Patient ID: [name], male   DOB: [birth year], 82 y.o.   MRN: [id number]   Subjective:    Patient ID: [name], male    DOB: November 21, 1931, 82 y.o.   MRN: [id number]  Chief Complaint  Patient presents with  . Follow-up    HPI Patient is in today for 6 week follow up patient has no acute concerns. No recent febrile illness or hospitalizations. Notes some mild intermittent consipation, no bloody or tarry stool. He is eating well. Denies CP/palp/SOB/HA/congestion/fevers or GU c/o. Taking meds as prescribed  Past Medical History:  Diagnosis Date  . AAA (abdominal aortic aneurysm) ([place])   . Acute bronchitis 09/02/2013  . Acute vascular insufficiency of intestine (HCC)    Mesenteric embolus...surgery 2003  . Anxiety   . Atrial fibrillation (HCC)    paroxysmal  . Biventricular implantable cardiac defibrillator in situ    GDT CONTAK H170-MADIT-CRT-EXPLANTED 2011 implanted defibrillator- Guidant cognis, model n119-2001 Dr. Caryl Comes (11/28/2009)  . Breast tenderness    Spironolactone was stopped and eplerinone started. Patient feels better  . CAD (coronary artery disease)    CABG 1994  /   catheterization 1996, occluded vein graft to the diagonal, other grafts patent  /   catheterization 2006, no PCI  /  nuclear, October, 2011, extensive scar anteroseptal and apical, no ischemia  . Carotid artery disease ([place])    Doppler, February,  2012, 0-39% bilateral, recommended followup 1 year  . CHF (congestive heart failure) (HCC)    chronic systolic  . Chronotropic incompetence   . Colon polyp   . Colonic polyp 2010   pathology not clear.   . Crohn's disease ([place])   . Degenerative joint disease   . Dizziness    Positional dizziness  . Edema 09/02/2013  . Edema leg    Venous Dopplers 8/13: No DVT bilaterally  . GERD (gastroesophageal reflux disease)   . Hx of CABG    1994  . Hyperlipemia   . Hypertension   . Hypertrophy of prostate with urinary obstruction and other lower urinary tract  symptoms (LUTS)   . Iron deficiency anemia   . Ischemic cardiomyopathy    CABG 1994 CAD catherization 1996.[name] occluded vein graft to the diagnol, other grafts patent/  catherization 2006, no PCI/ nuclear.[name] october 2011.[name] extensive scar anteroseptal and apical.. no ischemia    . LBBB (left bundle branch block)   . Mitral regurgitation    mild.[name] echo november 2011  EF 35%...echo.. november 2009/ ef 35%.[name] echo november 2011  . Oral thrush 02/05/2015  . Polymyalgia rheumatica ([place])   . Shingles   . Sinus bradycardia   . Thrombocytopenia ([place]) 2008  . Thrombocytopenia ([place]) 11/30/2011  . Thrush   . Tremor   . Ventral hernia    multiple, wears an abd binder, reports he hs been told he was at increased riisk for surgery  . Vitamin B12 deficiency   . Warfarin anticoagulation    Coumadin therapy for mesenteric embolus 2003    Past Surgical History:  Procedure Laterality Date  . CATARACT EXTRACTION, BILATERAL  2014  . COLONOSCOPY N/A 04/19/2013   Procedure: COLONOSCOPY;  Surgeon: Jerene Bears, MD;  Location: [facility] ENDOSCOPY;  Service: Endoscopy;  Laterality: N/A;  . CORONARY ARTERY BYPASS GRAFT  1994  . Elap w/ superior mesenteric artery embolectomy  1/03   by Dr. Jennette Banker  . ESOPHAGOGASTRODUODENOSCOPY N/A 04/18/2013   Procedure: ESOPHAGOGASTRODUODENOSCOPY (EGD);  Surgeon: Jerene Bears, MD;  Location: [facility] ENDOSCOPY;  Service: Endoscopy;  Laterality: N/A;  . implantation of biventric cardioverter-defibrillatorr     by Dr. Lovena Le in 8/06 Guidant Contak H170-explanted 2011  . implantation of guidant cognis device     model n119-2011  . laparoscopic cholecystectomy  2002  . left inguinal hernia repair with mesh  6/08   by Dr. Betsy Pries  . PROSTATE SURGERY  08/2012   by urology in [place]  . Repair of incarcerated ventral hernia and lysis of adhesions  11/03   by Dr. Betsy Pries    Family History  Problem Relation Age of Onset  . Heart disease Mother   . Tremor Sister   . Heart failure Sister   .  Heart disease Father 53  . Hypertension Father   . Appendicitis Brother     ruptured  . Diabetes Son   . Heart disease Sister   . Hypertension Sister   . Dementia Sister   . Heart disease Sister   . Hypertension Brother   . Kidney disease Brother   . Myasthenia gravis Brother   . Cirrhosis Brother   . Leukemia Brother   . Hypertension    . Hyperlipidemia    . Heart disease Sister   . Edema Sister     pedal edema    Social History   Social History  . Marital status: Married    Spouse name: Kennyth Lose  . Number of children: 3  . Years of education: N/A   Occupational History  . bull dozer driver   . farmer Retired  . logger    Social History Main Topics  . Smoking status: Former Smoker    Packs/day: 1.00    Years: 20.00    Types: Cigarettes    Quit date: 01/31/1973  . Smokeless tobacco: Never Used  . Alcohol use 0.0 oz/week     Comment: occasional  . Drug use: No  . Sexual activity: No     Comment: lives with wife, no dietary restrictions   Other Topics Concern  . Not on file   Social History Narrative   Lives in [place], [place] with wife.     Outpatient Medications Prior to Visit  Medication Sig Dispense Refill  . carvedilol (COREG) 6.25 MG tablet TAKE 1 TABLET BY MOUTH  TWICE A DAY WITH MEALS 180 tablet 0  . cholecalciferol (VITAMIN D) 1000 UNITS tablet Take 1,000 Units by mouth daily.     . folic acid (FOLVITE) 1 MG tablet Take 1 tablet by mouth daily    . furosemide (LASIX) 40 MG tablet TAKE 1 TABLET BY MOUTH TWO  TIMES DAILY (Patient taking differently: TAKE 1 TABLET BY MOUTH DAILY) 180 tablet 3  . lisinopril (PRINIVIL,ZESTRIL) 10 MG tablet TAKE 1 TABLET BY MOUTH  DAILY 90 tablet 3  . pantoprazole (PROTONIX) 40 MG tablet TAKE 1 TABLET BY MOUTH  EVERY DAY AT 12 NOON 90 tablet 6  . potassium chloride SA (KLOR-CON M20) 20 MEQ tablet Take 1 tablet (20 mEq total) by mouth daily. 90 tablet 3  . simvastatin (ZOCOR) 20 MG tablet Take 1 tablet by mouth at  bedtime 90  tablet 3  . vitamin B-12 (CYANOCOBALAMIN) 1000 MCG tablet Take 1,000 mcg by mouth every other day.    . warfarin (COUMADIN) 5 MG tablet TAKE AS DIRECTED BY  ANTICOAGULATION CLINIC 110 tablet 1   No facility-administered medications prior to visit.     No Known Allergies  Review of Systems  Constitutional: Negative for fever.  Eyes: Negative for  blurred vision.  Respiratory: Negative for cough and shortness of breath.   Cardiovascular: Negative for chest pain and palpitations.  Gastrointestinal: Positive for constipation. Negative for abdominal pain, blood in stool, diarrhea, heartburn, melena and vomiting.  Musculoskeletal: Negative for back pain.  Skin: Negative for rash.  Neurological: Negative for loss of consciousness and headaches.       Objective:    Physical Exam  Constitutional: He is oriented to person, place, and time. He appears well-developed and well-nourished. No distress.  HENT:  Head: Normocephalic and atraumatic.  Eyes: Conjunctivae are normal.  Neck: Normal range of motion. No thyromegaly present.  Cardiovascular: Normal rate and regular rhythm.   Pulmonary/Chest: Effort normal and breath sounds normal. He has no wheezes.  Abdominal: Soft. Bowel sounds are normal. There is no tenderness.  Musculoskeletal: Normal range of motion. He exhibits no edema or deformity.  Neurological: He is alert and oriented to person, place, and time.  Skin: Skin is warm and dry. He is not diaphoretic.  Psychiatric: He has a normal mood and affect.    BP 124/60 (BP Location: Left Arm, Patient Position: Sitting, Cuff Size: Normal)   Pulse 61   Temp 98.1 F (36.7 C) (Oral)   Wt 198 lb (89.8 kg)   SpO2 93%   BMI 26.85 kg/m  Wt Readings from Last 3 Encounters:  01/12/16 195 lb (88.5 kg)  01/04/16 198 lb (89.8 kg)  12/04/15 190 lb (86.2 kg)     Lab Results  Component Value Date   WBC 6.5 01/04/2016   HGB 14.9 01/04/2016   HCT 43.8 01/04/2016   PLT 91.0 (L) 01/04/2016     GLUCOSE 123 (H) 01/04/2016   CHOL 138 11/10/2015   TRIG 60.0 11/10/2015   HDL 62.20 11/10/2015   LDLDIRECT 81.6 12/30/2013   LDLCALC 64 11/10/2015   ALT 19 01/04/2016   AST 25 01/04/2016   NA 139 01/04/2016   K 4.0 01/04/2016   CL 102 01/04/2016   CREATININE 1.13 01/04/2016   BUN 9 01/04/2016   CO2 33 (H) 01/04/2016   TSH 2.40 11/10/2015   PSA 2.93 11/24/2010   INR 2.8 12/31/2015   HGBA1C 5.6 11/10/2015    Lab Results  Component Value Date   TSH 2.40 11/10/2015   Lab Results  Component Value Date   WBC 6.5 01/04/2016   HGB 14.9 01/04/2016   HCT 43.8 01/04/2016   MCV 89.6 01/04/2016   PLT 91.0 (L) 01/04/2016   Lab Results  Component Value Date   NA 139 01/04/2016   K 4.0 01/04/2016   CO2 33 (H) 01/04/2016   GLUCOSE 123 (H) 01/04/2016   BUN 9 01/04/2016   CREATININE 1.13 01/04/2016   BILITOT 3.6 (H) 01/04/2016   ALKPHOS 82 01/04/2016   AST 25 01/04/2016   ALT 19 01/04/2016   PROT 5.6 (L) 01/04/2016   ALBUMIN 3.4 (L) 01/04/2016   CALCIUM 9.1 01/04/2016   GFR 65.93 01/04/2016   Lab Results  Component Value Date   CHOL 138 11/10/2015   Lab Results  Component Value Date   HDL 62.20 11/10/2015   Lab Results  Component Value Date   LDLCALC 64 11/10/2015   Lab Results  Component Value Date   TRIG 60.0 11/10/2015   Lab Results  Component Value Date   CHOLHDL 2 11/10/2015   Lab Results  Component Value Date   HGBA1C 5.6 11/10/2015  I acted as a Education administrator for [employer], RMA  Assessment & Plan:   Problem List Items Addressed This Visit    Vitamin D deficiency    Encouraged daily supplements      POLYMYALGIA RHEUMATICA    No recent outbreaks no pain. Responded to steroids.       Hypertension    Well controlled, no changes to meds. Encouraged heart healthy diet such as the DASH diet and exercise as tolerated.       Relevant Orders   CBC (Completed)   Comprehensive metabolic panel (Completed)   Hyperlipemia     Tolerating statin, encouraged heart healthy diet, avoid trans fats, minimize simple carbs and saturated fats. Increase exercise as tolerated      Crohn's disease of large intestine (HCC)    Mild constipation intermittently. Encouraged increased hydration and fiber in diet. Daily probiotics. If bowels not moving can use MOM 2 tbls po in 4 oz of warm prune juice by mouth every 2-3 days. If no results then repeat in 4 hours with  Dulcolax suppository pr, may repeat again in 4 more hours as needed. Seek care if symptoms worsen. Consider daily Miralax and/or Dulcolax if symptoms persist.       Thrombocytopenia (HCC) - Primary   Relevant Orders   Comprehensive metabolic panel (Completed)   Acute blood loss anemia    Resolved on last blood draw no concerning episodes will check CBC today      Cirrhosis, nonalcoholic ([place])    Liver functions checked today, no concerning symptoms      Aortic atherosclerosis ([place])    Has been seen by vascular surgeon now with follow up in 6 months.         I am having Mr. Meneely maintain his cholecalciferol, vitamin W-09, folic acid, simvastatin, potassium chloride SA, pantoprazole, lisinopril, furosemide, carvedilol, and warfarin.  No orders of the defined types were placed in this encounter.  CMA served as Education administrator. Interview, physical exam and plan performed by me. Documentation reviewed and attested to.   Penni Homans, MD

## 2016-01-04 NOTE — Assessment & Plan Note (Signed)
Resolved on last blood draw no concerning episodes will check CBC today

## 2016-01-04 NOTE — Assessment & Plan Note (Signed)
Well controlled, no changes to meds. Encouraged heart healthy diet such as the DASH diet and exercise as tolerated.  °

## 2016-01-11 NOTE — Progress Notes (Signed)
Subjective:   [name] is a 82 y.o. male who presents for Medicare Annual/Subsequent preventive examination.  Review of Systems:  No ROS.  Medicare Wellness Visit.  Cardiac Risk Factors include: advanced age (>37mn, >>69women);dyslipidemia;male gender;hypertension  Sleep patterns: no sleep issues. Does not get up to void.  Home Safety/Smoke Alarms: Feels safe in home. Smoke alarms in place.    Living environment; residence and Firearm Safety: 2-story house, number of outside stairs: 1. Daughter and her family living w/ them right now-they are building a house. Firearms stored in a locked cabinet w/ ammo stored separate. Seat Belt Safety/Bike Helmet: Does not wear seat belt because it rubs defibrillator site.   Counseling:   Dental- sees Dr. KWallace Goingyearly and PRN. Has 2 dental implants. Dentures upper and lower.   Male:   CCS- last 04/19/13 w/ Dr. PHilarie Fredrickson Further screening recommendations per PCP or GI given pt's age.    PSA- Follows w/ urology for prostate. Lab Results  Component Value Date   PSA 2.93 11/24/2010   PSA 3.09 03/21/2008   PSA 2.49 05/03/2006         Objective:    Vitals: BP 128/84 (BP Location: Left Arm, Patient Position: Sitting, Cuff Size: Normal)   Pulse 84   Resp 16   Ht 5' 10.5" (1.791 m)   Wt 195 lb (88.5 kg)   SpO2 95%   BMI 27.58 kg/m   Body mass index is 27.58 kg/m.  Tobacco History  Smoking Status  . Former Smoker  . Packs/day: 1.00  . Years: 20.00  . Types: Cigarettes  . Quit date: 01/31/1973  Smokeless Tobacco  . Never Used     Counseling given: Not Answered   Past Medical History:  Diagnosis Date  . [place]) [place]   . Acute bronchitis 09/02/2013  . Acute vascular insufficiency of intestine (HCC)    Mesenteric embolus...surgery 2003  . Anxiety   . Atrial fibrillation (HCC)    paroxysmal  . Biventricular implantable cardiac defibrillator in situ    GDT CONTAK H170-MADIT-CRT-EXPLANTED 2011  implanted defibrillator- Guidant cognis, model n119-2001 Dr. KCaryl Comes(11/28/2009)  . Breast tenderness    Spironolactone was stopped and eplerinone started. Patient feels better  . CAD (coronary artery disease)    CABG 1994  /   catheterization 1996, occluded vein graft to the diagonal, other grafts patent  /   catheterization 2006, no PCI  /  nuclear, October, 2011, extensive scar anteroseptal and apical, no ischemia  . Carotid artery disease ([place]    Doppler, February,  2012, 0-39% bilateral, recommended followup 1 year  . CHF (congestive heart failure) (HCC)    chronic systolic  . Chronotropic incompetence   . Colon polyp   . Colonic polyp 2010   pathology not clear.   . Crohn's disease ([place]   . Degenerative joint disease   . Dizziness    Positional dizziness  . Edema 09/02/2013  . Edema leg    Venous Dopplers 8/13: No DVT bilaterally  . GERD (gastroesophageal reflux disease)   . Hx of CABG    1994  . Hyperlipemia   . Hypertension   . Hypertrophy of prostate with urinary obstruction and other lower urinary tract symptoms (LUTS)   . Iron deficiency anemia   . Ischemic cardiomyopathy    CABG 1994 CAD catherization 1996..Marland Kitchenoccluded vein graft to the diagnol, other grafts patent/  catherization 2006, no PCI/ nuclear..Marland Kitchenoctober 2011..Marland Kitchenextensive scar anteroseptal and  apical.. no ischemia    . LBBB (left bundle branch block)   . Mitral regurgitation    mild.[name] echo november 2011  EF 35%...echo.. november 2009/ ef 35%.[name] echo november 2011  . Oral thrush 02/05/2015  . Polymyalgia rheumatica ([place])   . Shingles   . Sinus bradycardia   . Thrombocytopenia ([place]) 2008  . Thrombocytopenia ([place]) 11/30/2011  . Thrush   . Tremor   . Ventral hernia    multiple, wears an abd binder, reports he hs been told he was at increased riisk for surgery  . Vitamin B12 deficiency   . Warfarin anticoagulation    Coumadin therapy for mesenteric embolus 2003   Past Surgical History:  Procedure Laterality Date   . CATARACT EXTRACTION, BILATERAL  2014  . COLONOSCOPY N/A 04/19/2013   Procedure: COLONOSCOPY;  Surgeon: Jerene Bears, MD;  Location[facility] ENDOSCOPY;  Service: Endoscopy;  Laterality: N/A;  . CORONARY ARTERY BYPASS GRAFT  1994  . Elap w/ superior mesenteric artery embolectomy  1/03   by Dr. Jennette Banker  . ESOPHAGOGASTRODUODENOSCOPY N/A 04/18/2013   Procedure: ESOPHAGOGASTRODUODENOSCOPY (EGD);  Surgeon: Jerene Bears, MD;  Location[facility] ENDOSCOPY;  Service: Endoscopy;  Laterality: N/A;  . implantation of biventric cardioverter-defibrillatorr     by Dr. Lovena Le in 8/06 Guidant Contak H170-explanted 2011  . implantation of guidant cognis device     model n119-2011  . laparoscopic cholecystectomy  2002  . left inguinal hernia repair with mesh  6/08   by Dr. Betsy Pries  . PROSTATE SURGERY  08/2012   by urology in [place]  . Repair of incarcerated ventral hernia and lysis of adhesions  11/03   by Dr. Betsy Pries   Family History  Problem Relation Age of Onset  . Heart disease Mother   . Tremor Sister   . Heart failure Sister   . Heart disease Father 20  . Hypertension Father   . Appendicitis Brother     ruptured  . Diabetes Son   . Heart disease Sister   . Hypertension Sister   . Dementia Sister   . Heart disease Sister   . Hypertension Brother   . Kidney disease Brother   . Myasthenia gravis Brother   . Cirrhosis Brother   . Leukemia Brother   . Hypertension    . Hyperlipidemia    . Heart disease Sister   . Edema Sister     pedal edema   History  Sexual Activity  . Sexual activity: No    Comment: lives with wife, no dietary restrictions    Outpatient Encounter Prescriptions as of 01/12/2016  Medication Sig  . carvedilol (COREG) 6.25 MG tablet TAKE 1 TABLET BY MOUTH  TWICE A DAY WITH MEALS  . cholecalciferol (VITAMIN D) 1000 UNITS tablet Take 1,000 Units by mouth daily.   . folic acid (FOLVITE) 1 MG tablet Take 1 tablet by mouth daily  . furosemide (LASIX) 40 MG tablet TAKE 1 TABLET  BY MOUTH TWO  TIMES DAILY (Patient taking differently: TAKE 1 TABLET BY MOUTH DAILY)  . lisinopril (PRINIVIL,ZESTRIL) 10 MG tablet TAKE 1 TABLET BY MOUTH  DAILY  . pantoprazole (PROTONIX) 40 MG tablet TAKE 1 TABLET BY MOUTH  EVERY DAY AT 12 NOON  . potassium chloride SA (KLOR-CON M20) 20 MEQ tablet Take 1 tablet (20 mEq total) by mouth daily.  . simvastatin (ZOCOR) 20 MG tablet Take 1 tablet by mouth at  bedtime  . vitamin B-12 (CYANOCOBALAMIN) 1000 MCG tablet Take 1,000 mcg by  mouth every other day.  . warfarin (COUMADIN) 5 MG tablet TAKE AS DIRECTED BY  ANTICOAGULATION CLINIC  . Zoster Vaccine Live, PF, (ZOSTAVAX) 77939 UNT/0.65ML injection Inject 19,400 Units into the skin once.   No facility-administered encounter medications on file as of 01/12/2016.     Activities of Daily Living In your present state of health, do you have any difficulty performing the following activities: 01/12/2016  Hearing? Y  Vision? N  Difficulty concentrating or making decisions? N  Walking or climbing stairs? N  Dressing or bathing? N  Doing errands, shopping? N  Preparing Food and eating ? N  Using the Toilet? N  In the past six months, have you accidently leaked urine? N  Do you have problems with loss of bowel control? Y  Managing your Medications? N  Managing your Finances? N  Housekeeping or managing your Housekeeping? N  Some recent data might be hidden    Patient Care Team: [name], MD as PCP - General (Family Medicine) Jerene Bears, MD as Consulting Physician (Gastroenterology) Darleen Crocker, MD as Consulting Physician (Ophthalmology) Dorothy Spark, MD as Consulting Physician (Cardiology) [place] as Consulting Physician (Urology) Waynetta Sandy, MD as Consulting Physician (Vascular Surgery) Deboraha Sprang, MD as Consulting Physician (Cardiology)   Assessment:    Physical assessment deferred to PCP.  Hearing/Vision: Hearing Screening Comments: Hard of  hearing. Does not wear hearing aids. Wore previously, but one set broke and he did not like the way the other fit. Vision Screening Comments: Does not wear glasses. Had bilateral cataract removal 1-2 years ago. Sees Dr. Renaldo Fiddler yearly.   Exercise Activities and Dietary recommendations Current Exercise Habits: The patient has a physically strenous job, but has no regular exercise apart from work.;Home exercise routine, Type of exercise: walking  Diet (meal preparation, eat out, water intake, caffeinated beverages, dairy products, fruits and vegetables): on average, 2 meals per day. Does not care for meat. Eats lots of beans and turnips and vegetables. Drinks water, soda, and coffee. Decreased water intake in the winter.     Goals    . Increase physical activity          As tolerated.  Walking more.  Gardening.        Fall Risk Fall Risk  01/12/2016 11/10/2015 12/09/2014 09/04/2014 08/30/2013  Falls in the past year? Yes No No Yes No  Number falls in past yr: 1 - - 1 -  Injury with Fall? No - - No -   Depression Screen PHQ 2/9 Scores 01/12/2016 11/10/2015 12/09/2014 09/04/2014  PHQ - 2 Score 0 0 0 0    Cognitive Function MMSE - Mini Mental State Exam 01/12/2016 12/09/2014  Orientation to time 4 5  Orientation to Place 5 5  Registration 3 3  Attention/ Calculation 4 5  Recall 2 3  Language- name 2 objects 2 2  Language- repeat 1 1  Language- follow 3 step command 3 3  Language- read & follow direction 1 1  Write a sentence 0 1  Write a sentence-comments Pt cannot write. -  Copy design 0 1  Total score 25 30        Immunization History  Administered Date(s) Administered  . Influenza Split 11/24/2010, 11/30/2011, 10/31/2012  . Influenza Whole 11/06/2008  . Influenza, High Dose Seasonal PF 11/10/2015  . Influenza,inj,Quad PF,36+ Mos 12/30/2013, 12/09/2014  . Pneumococcal Conjugate-13 12/09/2014  . Pneumococcal Polysaccharide-23 03/11/2013  . Tdap 03/11/2013   Screening  Tests Health Maintenance  Topic Date Due  . ZOSTAVAX  05/25/1993  . COLONOSCOPY  04/20/2018  . TETANUS/TDAP  03/12/2023  . INFLUENZA VACCINE  Completed  . PNA vac Low Risk Adult  Completed      Plan:   Follow-up w/ Dr. Charlett Blake as scheduled. Bring a copy of your advance directives to your next office visit. Rx provided for Zostavax.  During the course of the visit the patient was educated and counseled about the following appropriate screening and preventive services:   Vaccines to include Pneumoccal, Influenza, Hepatitis B, Td, Zostavax, HCV  Cardiovascular Disease  Colorectal cancer screening  Diabetes screening  Prostate Cancer Screening  Glaucoma screening  Nutrition counseling   Patient Instructions (the written plan) was given to the patient.    Dorrene German, RN  01/12/2016

## 2016-01-11 NOTE — Progress Notes (Signed)
Pre visit review using our clinic review tool, if applicable. No additional management support is needed unless otherwise documented below in the visit note. 

## 2016-01-12 ENCOUNTER — Ambulatory Visit (INDEPENDENT_AMBULATORY_CARE_PROVIDER_SITE_OTHER): Payer: Medicare Other | Admitting: *Deleted

## 2016-01-12 ENCOUNTER — Encounter: Payer: Self-pay | Admitting: *Deleted

## 2016-01-12 VITALS — BP 128/84 | HR 84 | Resp 16 | Ht 70.5 in | Wt 195.0 lb

## 2016-01-12 DIAGNOSIS — Z0000 Encounter for general adult medical examination without abnormal findings: Secondary | ICD-10-CM

## 2016-01-12 MED ORDER — ZOSTER VACCINE LIVE 19400 UNT/0.65ML ~~LOC~~ SUSR: 0.6500 mL | Freq: Once | SUBCUTANEOUS | 0 refills | Status: AC

## 2016-01-12 NOTE — Progress Notes (Signed)
RN AWV note reviewed. Agree with documention and plan. 

## 2016-01-12 NOTE — Patient Instructions (Addendum)
It was nice to meet you today! Bring a copy of your advance directives to your next office visit. Try to increase your water intake during the winter months.  Advance Directive Advance directives are the legal documents that allow you to make choices about your health care and medical treatment if you cannot speak for yourself. Advance directives are a way for you to communicate your wishes to family, friends, and health care providers. The specified people can then convey your decisions about end-of-life care to avoid confusion if you should become unable to communicate. Ideally, the process of discussing and writing advance directives should happen over time rather than making decisions all at once. Advance directives can be modified as your situation changes, and you can change your mind at any time, even after you have signed the advance directives. Each state has its own laws regarding advance directives. You may want to check with your health care provider, attorney, or state representative about the law in your state. Below are some examples of advance directives. HEALTH CARE PROXY AND DURABLE POWER OF ATTORNEY FOR HEALTH CARE A health care proxy is a person (agent) appointed to make medical decisions for you if you cannot. Generally, people choose someone they know well and trust to represent their preferences when they can no longer do so. You should be sure to ask this person for agreement to act as your agent. An agent may have to exercise judgment in the event of a medical decision for which your wishes are not known. A durable power of attorney for health care is a legal document that names your health care proxy. Depending on the laws in your state, after the document is written, it may also need to be:  Signed.  Notarized.  Dated.  Copied.  Witnessed.  Incorporated into your medical record. You may also want to appoint someone to manage your financial affairs if you cannot. This  is called a durable power of attorney for finances. It is a separate legal document from the durable power of attorney for health care. You may choose the same person or someone different from your health care proxy to act as your agent in financial matters. LIVING WILL A living will is a set of instructions documenting your wishes about medical care when you cannot care for yourself. It is used if you become:  Terminally ill.  Incapacitated.  Unable to communicate.  Unable to make decisions. Items to consider in your living will include:  The use or non-use of life-sustaining equipment, such as dialysis machines and breathing machines (ventilators).  A do not resuscitate (DNR) order, which is the instruction not to use cardiopulmonary resuscitation (CPR) if breathing or heartbeat stops.  Tube feeding.  Withholding of food and fluids.  Comfort (palliative) care when the goal becomes comfort rather than a cure.  Organ and tissue donation. A living will does not give instructions about distribution of your money and property if you should pass away. It is advisable to seek the expert advice of a lawyer in drawing up a will regarding your possessions. Decisions about taxes, beneficiaries, and asset distribution will be legally binding. This process can relieve your family and friends of any burdens surrounding disputes or questions that may come up about the allocation of your assets. DO NOT RESUSCITATE (DNR) A do not resuscitate (DNR) order is a request to not have CPR in the event that your heart stops beating or you stop breathing. Unless given other instructions,  a health care provider will try to help any patient whose heart has stopped or who has stopped breathing.  This information is not intended to replace advice given to you by your health care provider. Make sure you discuss any questions you have with your health care provider. Document Released: 04/26/2007 Document Revised:  05/11/2015 Document Reviewed: 06/06/2012 Elsevier Interactive Patient Education  2017 [employer].

## 2016-01-17 NOTE — Assessment & Plan Note (Signed)
Encouraged daily supplements

## 2016-01-17 NOTE — Assessment & Plan Note (Signed)
Mild constipation intermittently. Encouraged increased hydration and fiber in diet. Daily probiotics. If bowels not moving can use MOM 2 tbls po in 4 oz of warm prune juice by mouth every 2-3 days. If no results then repeat in 4 hours with  Dulcolax suppository pr, may repeat again in 4 more hours as needed. Seek care if symptoms worsen. Consider daily Miralax and/or Dulcolax if symptoms persist.

## 2016-01-27 ENCOUNTER — Other Ambulatory Visit: Payer: Self-pay | Admitting: Internal Medicine

## 2016-01-30 ENCOUNTER — Other Ambulatory Visit: Payer: Self-pay | Admitting: Cardiology

## 2016-02-01 IMAGING — US US ABDOMEN COMPLETE
1 series · 13 of 25 positions shown · non-contrast
Comparison: CT on 02/15/2013

CLINICAL DATA: Generalized abdominal pain. Elevated bilirubin.
Prior cholecystectomy.

EXAM:
ULTRASOUND ABDOMEN COMPLETE

[Series 1: us abdomen complete · 0.27mm/px · 13 of 67 slices shown]
[im 1/67]
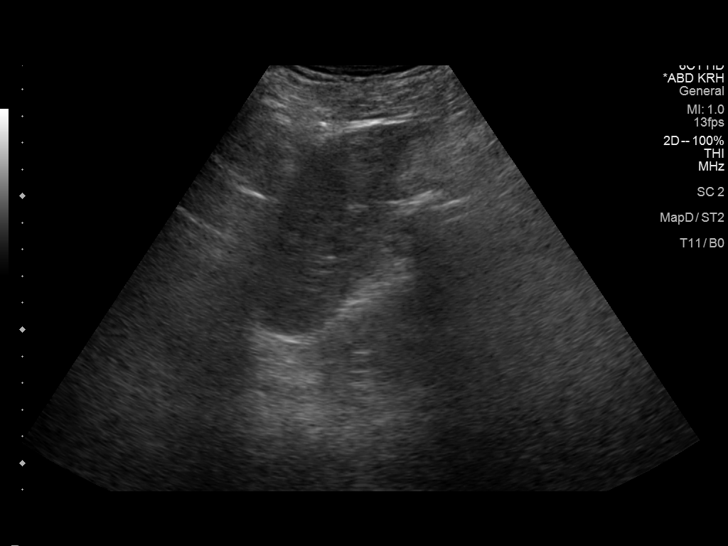
[im 6/67]
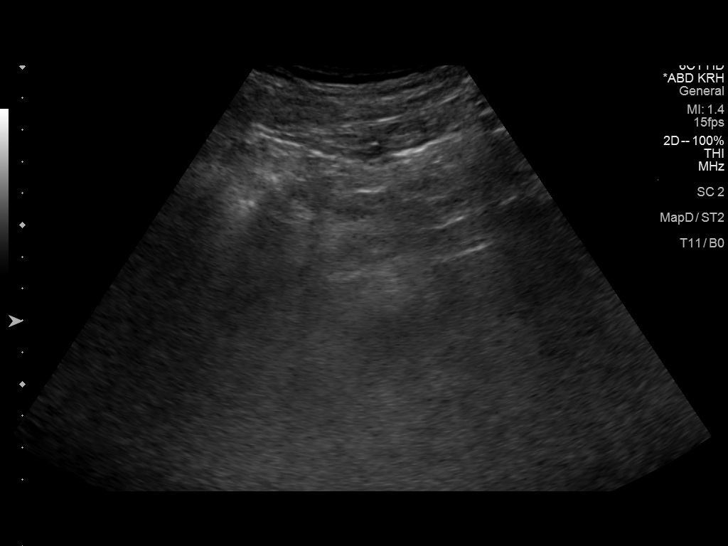
[im 12/67]
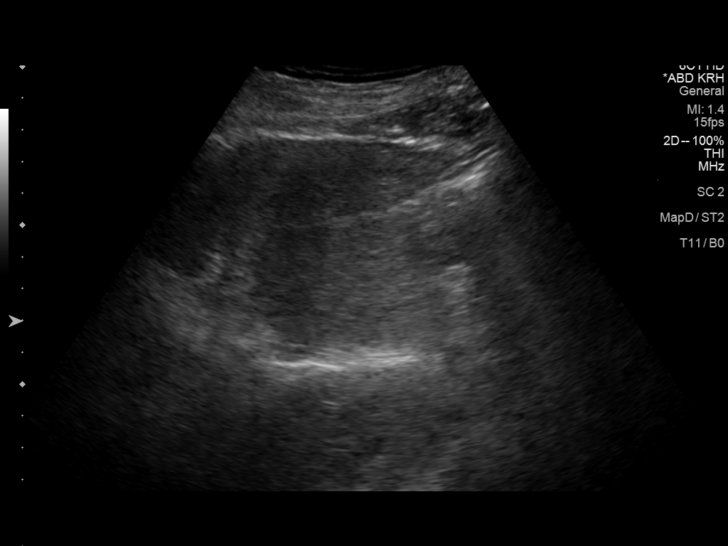
[im 17/67]
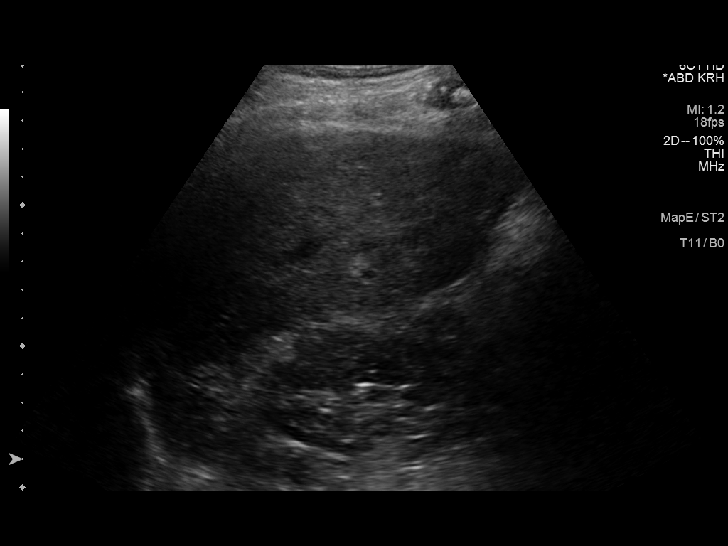
[im 23/67]
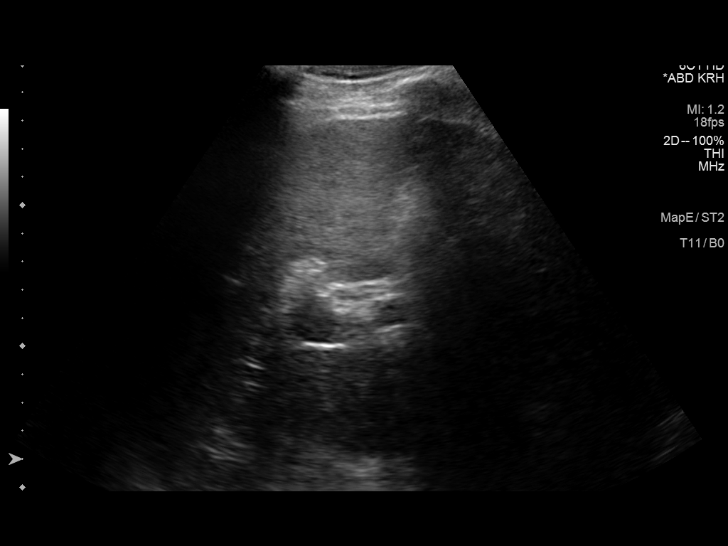
[im 28/67]
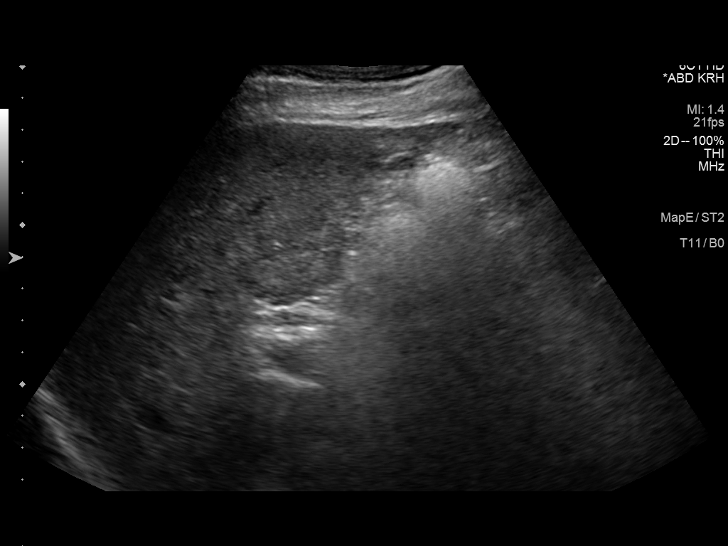
[im 34/67]
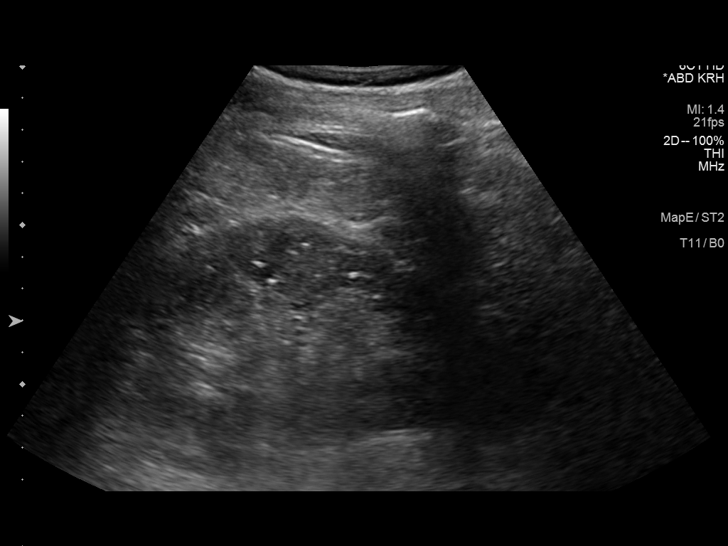
[im 39/67]
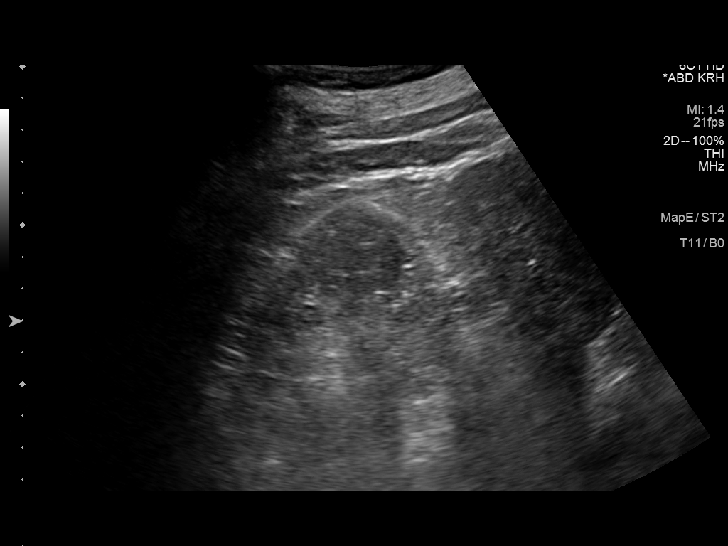
[im 45/67]
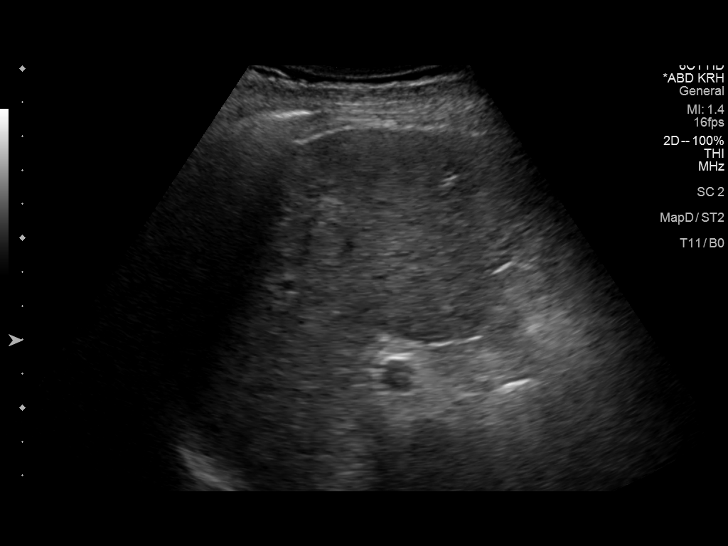
[im 50/67]
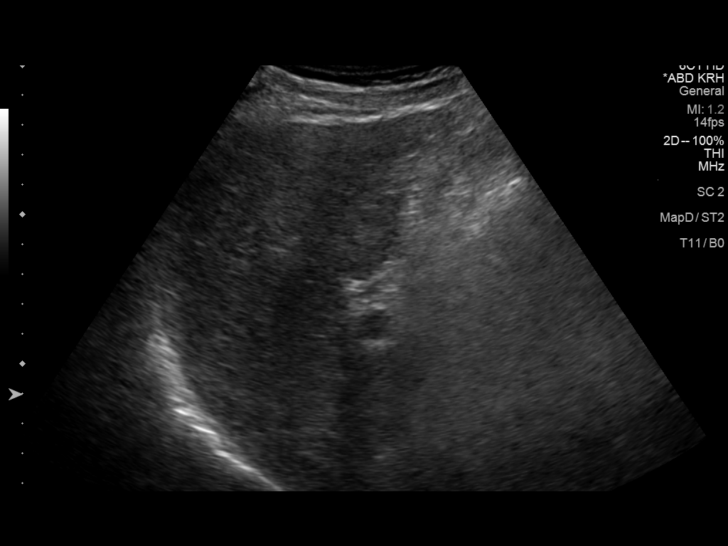
[im 56/67]
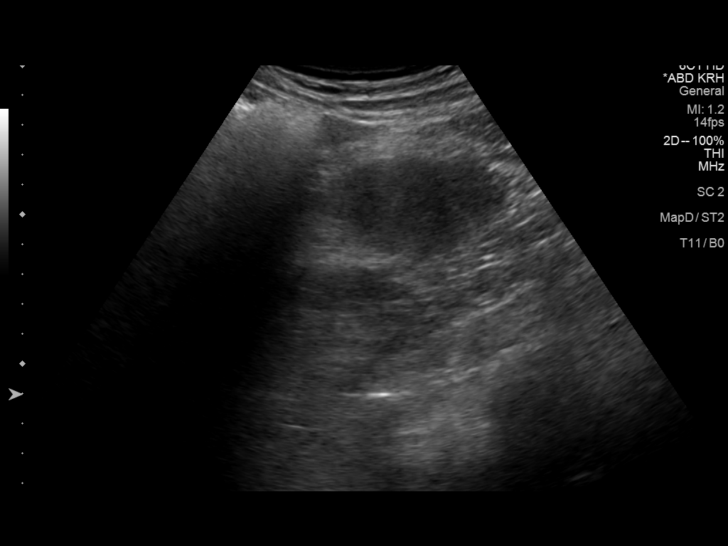
[im 61/67]
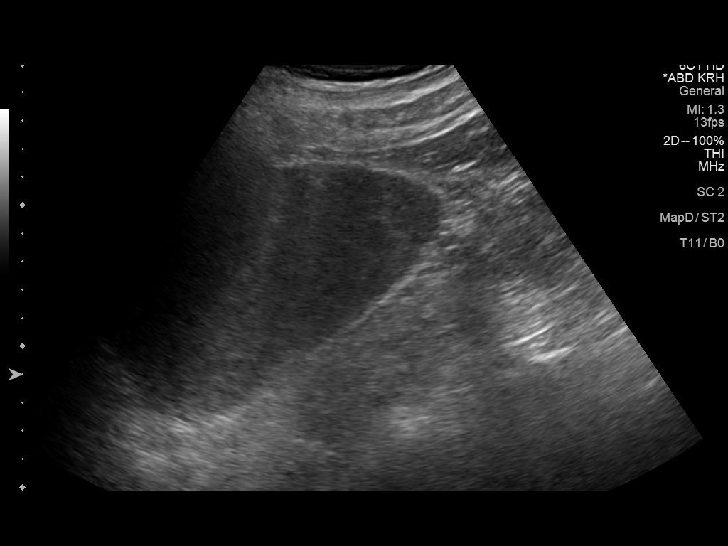
[im 67/67]
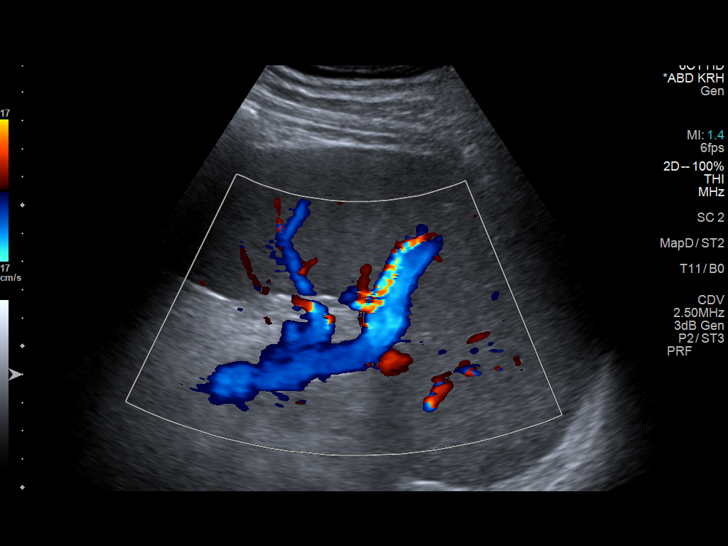

[13 of 25 positions shown; findings below may reference images not displayed]

FINDINGS: Gallbladder: Surgically absent.

Common bile duct: Diameter: 4 mm

Liver: Coarse echotexture and mild capsular nodularity suspicious
for hepatic cirrhosis. No focal liver mass visualized.

IVC: Not well visualized

Pancreas: Not well visualized due to overlying bowel gas.

Spleen: Splenomegaly, with length measuring approximately 14 mm and
estimated volume of 721 mL.

Right Kidney: Length: 11.2 cm. Diffusely increased parenchymal
echogenicity. 1.7 cm cyst in upper pole. No mass or hydronephrosis
visualized.

Left Kidney: Length: Could not be accurately measured.. Not well
visualized.

Abdominal aorta: Obscured by patient habitus and overlying bowel
gas.

Other findings: None.
IMPRESSION: Technically suboptimal exam due to large anterior abdominal wall
hernia and bowel gas.

Prior cholecystectomy.  No evidence of biliary ductal dilatation.

Probable hepatic cirrhosis. Splenomegaly may be secondary to portal
venous hypertension.

Increased renal parenchymal echogenicity, consistent with medical
renal disease. No evidence of hydronephrosis. Left kidney not well
visualized on this study.

## 2016-02-07 ENCOUNTER — Other Ambulatory Visit: Payer: Self-pay | Admitting: Family Medicine

## 2016-02-07 ENCOUNTER — Other Ambulatory Visit: Payer: Self-pay | Admitting: Cardiology

## 2016-02-09 ENCOUNTER — Other Ambulatory Visit: Payer: Self-pay | Admitting: *Deleted

## 2016-02-10 ENCOUNTER — Ambulatory Visit (INDEPENDENT_AMBULATORY_CARE_PROVIDER_SITE_OTHER): Payer: Medicare HMO | Admitting: *Deleted

## 2016-02-10 DIAGNOSIS — I255 Ischemic cardiomyopathy: Secondary | ICD-10-CM

## 2016-02-10 NOTE — Progress Notes (Signed)
Remote ICD transmission.   

## 2016-02-11 ENCOUNTER — Encounter: Payer: Self-pay | Admitting: Cardiology

## 2016-02-11 ENCOUNTER — Ambulatory Visit (INDEPENDENT_AMBULATORY_CARE_PROVIDER_SITE_OTHER): Payer: Medicare HMO | Admitting: *Deleted

## 2016-02-11 DIAGNOSIS — K55069 Acute infarction of intestine, part and extent unspecified: Secondary | ICD-10-CM

## 2016-02-11 LAB — POCT INR: INR: 2.9

## 2016-02-25 ENCOUNTER — Encounter: Payer: Self-pay | Admitting: Cardiology

## 2016-02-25 LAB — CUP PACEART REMOTE DEVICE CHECK
Brady Statistic RA Percent Paced: 65 %
HighPow Impedance: 60 Ohm
Implantable Lead Implant Date: 20060808
Implantable Lead Implant Date: 20060911
Implantable Lead Location: 753858
Implantable Lead Model: 158
Implantable Lead Model: 4087
Implantable Lead Serial Number: 261368
Lead Channel Impedance Value: 470 Ohm
Lead Channel Impedance Value: 560 Ohm
Lead Channel Pacing Threshold Amplitude: 1 V
Lead Channel Pacing Threshold Pulse Width: 0.4 ms
Lead Channel Pacing Threshold Pulse Width: 0.4 ms
Lead Channel Setting Pacing Amplitude: 2 V
Lead Channel Setting Pacing Amplitude: 2.3 V
Lead Channel Setting Pacing Amplitude: 2.4 V
Lead Channel Setting Pacing Pulse Width: 0.4 ms
Lead Channel Setting Pacing Pulse Width: 0.8 ms
Lead Channel Setting Sensing Sensitivity: 1 mV
MDC IDC LEAD IMPLANT DT: 20060808
MDC IDC LEAD LOCATION: 753859
MDC IDC LEAD LOCATION: 753860
MDC IDC LEAD SERIAL: 165392
MDC IDC MSMT BATTERY REMAINING LONGEVITY: 48 mo
MDC IDC MSMT BATTERY REMAINING PERCENTAGE: 75 %
MDC IDC MSMT LEADCHNL LV IMPEDANCE VALUE: 415 Ohm
MDC IDC MSMT LEADCHNL LV PACING THRESHOLD AMPLITUDE: 0.7 V
MDC IDC MSMT LEADCHNL LV PACING THRESHOLD PULSEWIDTH: 0.8 ms
MDC IDC MSMT LEADCHNL RA PACING THRESHOLD AMPLITUDE: 1.1 V
MDC IDC PG IMPLANT DT: 20111028
MDC IDC PG SERIAL: 490477
MDC IDC SESS DTM: 20180110073400
MDC IDC SET LEADCHNL RV SENSING SENSITIVITY: 0.6 mV
MDC IDC STAT BRADY RV PERCENT PACED: 95 %

## 2016-03-15 ENCOUNTER — Other Ambulatory Visit: Payer: Self-pay

## 2016-03-15 ENCOUNTER — Telehealth: Payer: Self-pay

## 2016-03-15 DIAGNOSIS — K7460 Unspecified cirrhosis of liver: Secondary | ICD-10-CM

## 2016-03-15 NOTE — Telephone Encounter (Signed)
Pt scheduled to have [place] of ABD at [facility] 03/23/16@7 :30am. Pt to arrive there at 7:15am and be NPO after midnight. Pt aware of appt.

## 2016-03-15 NOTE — Telephone Encounter (Signed)
-----   Message from Algernon Huxley, RN sent at 09/24/2015  9:53 AM EDT ----- Regarding: [name] Pt needs repeat [name] in 6 mth

## 2016-03-21 ENCOUNTER — Ambulatory Visit (INDEPENDENT_AMBULATORY_CARE_PROVIDER_SITE_OTHER): Payer: Medicare HMO | Admitting: *Deleted

## 2016-03-21 DIAGNOSIS — K55069 Acute infarction of intestine, part and extent unspecified: Secondary | ICD-10-CM

## 2016-03-21 LAB — POCT INR: INR: 2.9

## 2016-03-23 ENCOUNTER — Ambulatory Visit (HOSPITAL_COMMUNITY)
Admission: RE | Admit: 2016-03-23 | Discharge: 2016-03-23 | Disposition: A | Discharge status: Home / Self Care | Payer: Medicare HMO | Source: Ambulatory Visit | Attending: Internal Medicine | Admitting: Internal Medicine

## 2016-03-23 DIAGNOSIS — K7460 Unspecified cirrhosis of liver: Secondary | ICD-10-CM

## 2016-03-23 DIAGNOSIS — R161 Splenomegaly, not elsewhere classified: Secondary | ICD-10-CM | POA: Diagnosis not present

## 2016-03-23 DIAGNOSIS — N2889 Other specified disorders of kidney and ureter: Secondary | ICD-10-CM | POA: Insufficient documentation

## 2016-03-23 DIAGNOSIS — N200 Calculus of kidney: Secondary | ICD-10-CM | POA: Diagnosis not present

## 2016-03-28 ENCOUNTER — Ambulatory Visit (INDEPENDENT_AMBULATORY_CARE_PROVIDER_SITE_OTHER): Payer: Medicare HMO

## 2016-03-28 DIAGNOSIS — Z95810 Presence of automatic (implantable) cardiac defibrillator: Secondary | ICD-10-CM

## 2016-03-28 DIAGNOSIS — I5022 Chronic systolic (congestive) heart failure: Secondary | ICD-10-CM

## 2016-03-29 NOTE — Progress Notes (Signed)
EPIC Encounter for ICM Monitoring  Patient Name: [name] is a 83 y.o. male Date: 03/29/2016 Primary Care Physican: Penni Homans, MD Primary Cardiologist:Nelson Electrophysiologist: Caryl Comes Dry Weight:unknown Right Ventricular Pacing 95% and Left Ventricular Pacing 99% Most Recent Daily Measurement (Mar 27, 2016) Respiratory Rate 21 rpm Activity Level 2.1 hour(s)  Total Time in AT/AF 0.0 hour(s)     Spoke with wife.  She stated he is doing well and denied any fluid symptoms.     Latitude respiratory rate was 21 and activity level 1.7 hours   Prescribed and confirmed dosage: Furosemide 40 mg 1 tablet twice a day but patient only takes 1 tablet daily and 2nd tablet if needed.  Potassium 20 mEq 1 tablet daily  Recommendations: No changes. Reminded to limit dietary salt intake to 2000 mg/day and fluid intake to < 2 liters/day. Encouraged to call for fluid symptoms.  Follow-up plan: ICM clinic phone appointment on 05/02/2016.  Office appointment with Dr Meda Coffee 04/20/2016 and confirmed with wife.   Copy of ICM check sent to device physician.   3 month ICM trend: 03/26/2016   1 Year ICM trend:      Rosalene Billings, RN 03/29/2016 11:33 AM

## 2016-04-05 ENCOUNTER — Encounter: Payer: Self-pay | Admitting: Family Medicine

## 2016-04-05 ENCOUNTER — Ambulatory Visit (INDEPENDENT_AMBULATORY_CARE_PROVIDER_SITE_OTHER): Payer: Medicare HMO | Admitting: Family Medicine

## 2016-04-05 VITALS — BP 118/60 | HR 60 | Temp 97.5°F | Wt 201.6 lb

## 2016-04-05 DIAGNOSIS — L309 Dermatitis, unspecified: Secondary | ICD-10-CM | POA: Diagnosis not present

## 2016-04-05 DIAGNOSIS — R739 Hyperglycemia, unspecified: Secondary | ICD-10-CM | POA: Diagnosis not present

## 2016-04-05 DIAGNOSIS — J309 Allergic rhinitis, unspecified: Secondary | ICD-10-CM

## 2016-04-05 DIAGNOSIS — K7460 Unspecified cirrhosis of liver: Secondary | ICD-10-CM

## 2016-04-05 DIAGNOSIS — D696 Thrombocytopenia, unspecified: Secondary | ICD-10-CM

## 2016-04-05 DIAGNOSIS — E782 Mixed hyperlipidemia: Secondary | ICD-10-CM | POA: Diagnosis not present

## 2016-04-05 DIAGNOSIS — E559 Vitamin D deficiency, unspecified: Secondary | ICD-10-CM

## 2016-04-05 DIAGNOSIS — E538 Deficiency of other specified B group vitamins: Secondary | ICD-10-CM | POA: Diagnosis not present

## 2016-04-05 DIAGNOSIS — L299 Pruritus, unspecified: Secondary | ICD-10-CM | POA: Diagnosis not present

## 2016-04-05 DIAGNOSIS — I10 Essential (primary) hypertension: Secondary | ICD-10-CM | POA: Diagnosis not present

## 2016-04-05 HISTORY — DX: Dermatitis, unspecified: L30.9

## 2016-04-05 LAB — COMPREHENSIVE METABOLIC PANEL
ALT: 16 U/L (ref 0–53)
AST: 31 U/L (ref 0–37)
Albumin: 3.7 g/dL (ref 3.5–5.2)
Alkaline Phosphatase: 84 U/L (ref 39–117)
BUN: 7 mg/dL (ref 6–23)
CHLORIDE: 103 meq/L (ref 96–112)
CO2: 35 mEq/L — ABNORMAL HIGH (ref 19–32)
Calcium: 8.9 mg/dL (ref 8.4–10.5)
Creatinine, Ser: 0.97 mg/dL (ref 0.40–1.50)
GFR: 78.59 mL/min (ref >60.00)
GLUCOSE: 137 mg/dL — AB (ref 70–99)
POTASSIUM: 3.7 meq/L (ref 3.5–5.1)
SODIUM: 141 meq/L (ref 135–145)
Total Bilirubin: 2.7 mg/dL — ABNORMAL HIGH (ref 0.2–1.2)
Total Protein: 5.8 g/dL — ABNORMAL LOW (ref 6.0–8.3)

## 2016-04-05 LAB — CBC
HEMATOCRIT: 46.2 % (ref 39.0–52.0)
Hemoglobin: 15.5 g/dL (ref 13.0–17.0)
MCHC: 33.6 g/dL (ref 30.0–36.0)
MCV: 90.2 fl (ref 78.0–100.0)
Platelets: 72 10*3/uL — ABNORMAL LOW (ref 150.0–400.0)
RBC: 5.12 Mil/uL (ref 4.22–5.81)
RDW: 14.2 % (ref 11.5–15.5)
WBC: 5.5 10*3/uL (ref 4.0–10.5)

## 2016-04-05 LAB — LIPID PANEL
Cholesterol: 136 mg/dL (ref 0–200)
HDL: 45.1 mg/dL (ref >39.00)
LDL CALC: 52 mg/dL (ref 0–99)
NONHDL: 90.97
Total CHOL/HDL Ratio: 3
Triglycerides: 195 mg/dL — ABNORMAL HIGH (ref 0.0–149.0)
VLDL: 39 mg/dL (ref 0.0–40.0)

## 2016-04-05 LAB — TSH: TSH: 1.46 u[IU]/mL (ref 0.35–4.50)

## 2016-04-05 LAB — VITAMIN B12: Vitamin B-12: 1130 pg/mL — ABNORMAL HIGH (ref 211–911)

## 2016-04-05 LAB — VITAMIN D 25 HYDROXY (VIT D DEFICIENCY, FRACTURES): VITD: 30.1 ng/mL (ref 30.00–100.00)

## 2016-04-05 LAB — HEMOGLOBIN A1C: Hgb A1c MFr Bld: 5.7 % (ref 4.6–6.5)

## 2016-04-05 MED ORDER — NYSTATIN 100000 UNIT/GM EX CREA: 1.0000 "application " | TOPICAL_CREAM | Freq: Two times a day (BID) | CUTANEOUS | 1 refills | Status: DC

## 2016-04-05 MED ORDER — ZOSTER VAC RECOMB ADJUVANTED 50 MCG/0.5ML IM SUSR: 50.0000 ug | Freq: Once | INTRAMUSCULAR | 1 refills | Status: DC

## 2016-04-05 MED ORDER — ZOSTER VAC RECOMB ADJUVANTED 50 MCG/0.5ML IM SUSR: 50.0000 ug | Freq: Once | INTRAMUSCULAR | 1 refills | Status: AC

## 2016-04-05 MED ORDER — FLUCONAZOLE 150 MG PO TABS: 150.0000 mg | ORAL_TABLET | Freq: Every day | ORAL | 0 refills | Status: DC

## 2016-04-05 NOTE — Progress Notes (Signed)
Pre visit review using our clinic review tool, if applicable. No additional management support is needed unless otherwise documented below in the visit note. 

## 2016-04-05 NOTE — Assessment & Plan Note (Signed)
Asymptomatic, check CBC today

## 2016-04-05 NOTE — Progress Notes (Signed)
Patient ID: [name], male   DOB: 06/03/1931, 83 y.o.   MRN: [id number]   Subjective:  I acted as a Education administrator for Penni Homans, [place], [place]   Patient ID: [name], male    DOB: August 19, 1931, 83 y.o.   MRN: [id number]  Chief Complaint  Patient presents with  . Hypertension    Thrombocytopenia ([place]).  . Rash    Hypertension  This is a chronic problem. The problem is controlled. Pertinent negatives include no blurred vision, chest pain, headaches, malaise/fatigue, palpitations or shortness of breath. Risk factors for coronary artery disease include male gender. The current treatment provides mild improvement. There are no compliance problems.   Rash  This is a new problem. The current episode started in the past 7 days. Location: Both legs from knee up to mid-thigh. Left leg is worse.. The rash is characterized by itchiness. Pertinent negatives include no congestion, cough, fever, shortness of breath or vomiting. The treatment provided no relief.    Patient is in today for a follow up for hypertension. Patient has a Hx of GERD, OSA, Crohn's Disease, HTN. Patient also has a complaint of a rash on both legs for the past two days. States that the rash has an intense itchy feeling. States that the rash is on both legs, but is worse on the left leg. Rash is lower knee to mid-thigh. Patient has no additional acute concerns noted. He is eating well and stays active. Denies CP/palp/SOB/HA/congestion/fevers/GI or GU c/o. Taking meds as prescribed  Patient Care Team: [name], MD as PCP - General (Family Medicine) Jerene Bears, MD as Consulting Physician (Gastroenterology) Darleen Crocker, MD as Consulting Physician (Ophthalmology) Dorothy Spark, MD as Consulting Physician (Cardiology) Dickinson as Consulting Physician (Urology) Waynetta Sandy, MD as Consulting Physician (Vascular Surgery) Deboraha Sprang, MD as Consulting Physician (Cardiology)   Past Medical  History:  Diagnosis Date  . AAA (abdominal aortic aneurysm) ([place])   . Acute bronchitis 09/02/2013  . Acute vascular insufficiency of intestine (HCC)    Mesenteric embolus...surgery 2003  . Anxiety   . Atrial fibrillation (HCC)    paroxysmal  . Biventricular implantable cardiac defibrillator in situ    GDT CONTAK H170-MADIT-CRT-EXPLANTED 2011 implanted defibrillator- Guidant cognis, model n119-2001 Dr. Caryl Comes (11/28/2009)  . Breast tenderness    Spironolactone was stopped and eplerinone started. Patient feels better  . CAD (coronary artery disease)    CABG 1994  /   catheterization 1996, occluded vein graft to the diagonal, other grafts patent  /   catheterization 2006, no PCI  /  nuclear, October, 2011, extensive scar anteroseptal and apical, no ischemia  . Carotid artery disease ([place])    Doppler, February,  2012, 0-39% bilateral, recommended followup 1 year  . CHF (congestive heart failure) (HCC)    chronic systolic  . Chronotropic incompetence   . Colon polyp   . Colonic polyp 2010   pathology not clear.   . Crohn's disease ([place])   . Degenerative joint disease   . Dermatitis 04/05/2016  . Dizziness    Positional dizziness  . Edema 09/02/2013  . Edema leg    Venous Dopplers 8/13: No DVT bilaterally  . GERD (gastroesophageal reflux disease)   . Hx of CABG    1994  . Hyperlipemia   . Hypertension   . Hypertrophy of prostate with urinary obstruction and other lower urinary tract symptoms (LUTS)   . Iron deficiency anemia   .  Ischemic cardiomyopathy    CABG 1994 CAD catherization 1996.[name] occluded vein graft to the diagnol, other grafts patent/  catherization 2006, no PCI/ nuclear.[name] october 2011.[name] extensive scar anteroseptal and apical.. no ischemia    . LBBB (left bundle branch block)   . Mitral regurgitation    mild.[name] echo november 2011  EF 35%...echo.. november 2009/ ef 35%.[name] echo november 2011  . Oral thrush 02/05/2015  . Polymyalgia rheumatica ([place])   . Shingles   . Sinus  bradycardia   . Thrombocytopenia ([place]) 2008  . Thrombocytopenia ([place]) 11/30/2011  . Thrush   . Tremor   . Ventral hernia    multiple, wears an abd binder, reports he hs been told he was at increased riisk for surgery  . Vitamin B12 deficiency   . Warfarin anticoagulation    Coumadin therapy for mesenteric embolus 2003    Past Surgical History:  Procedure Laterality Date  . CATARACT EXTRACTION, BILATERAL  2014  . COLONOSCOPY N/A 04/19/2013   Procedure: COLONOSCOPY;  Surgeon: Jerene Bears, MD;  Location[facility] ENDOSCOPY;  Service: Endoscopy;  Laterality: N/A;  . CORONARY ARTERY BYPASS GRAFT  1994  . Elap w/ superior mesenteric artery embolectomy  1/03   by Dr. Jennette Banker  . ESOPHAGOGASTRODUODENOSCOPY N/A 04/18/2013   Procedure: ESOPHAGOGASTRODUODENOSCOPY (EGD);  Surgeon: Jerene Bears, MD;  Location[facility] ENDOSCOPY;  Service: Endoscopy;  Laterality: N/A;  . implantation of biventric cardioverter-defibrillatorr     by Dr. Lovena Le in 8/06 Guidant Contak H170-explanted 2011  . implantation of guidant cognis device     model n119-2011  . laparoscopic cholecystectomy  2002  . left inguinal hernia repair with mesh  6/08   by Dr. Betsy Pries  . PROSTATE SURGERY  08/2012   by urology in [place]  . Repair of incarcerated ventral hernia and lysis of adhesions  11/03   by Dr. Betsy Pries    Family History  Problem Relation Age of Onset  . Heart disease Mother   . Tremor Sister   . Heart failure Sister   . Heart disease Father 69  . Hypertension Father   . Appendicitis Brother     ruptured  . Diabetes Son   . Heart disease Sister   . Hypertension Sister   . Dementia Sister   . Heart disease Sister   . Hypertension Brother   . Kidney disease Brother   . Myasthenia gravis Brother   . Cirrhosis Brother   . Leukemia Brother   . Hypertension    . Hyperlipidemia    . Heart disease Sister   . Edema Sister     pedal edema    Social History   Social History  . Marital status: Married    Spouse  name: Kennyth Lose  . Number of children: 3  . Years of education: N/A   Occupational History  . bull dozer driver   . farmer Retired  . logger    Social History Main Topics  . Smoking status: Former Smoker    Packs/day: 1.00    Years: 20.00    Types: Cigarettes    Quit date: 01/31/1973  . Smokeless tobacco: Never Used  . Alcohol use 0.0 oz/week     Comment: occasional  . Drug use: No  . Sexual activity: No     Comment: lives with wife, no dietary restrictions   Other Topics Concern  . Not on file   Social History Narrative   Lives in [place], [place] with wife.     Outpatient  Medications Prior to Visit  Medication Sig Dispense Refill  . carvedilol (COREG) 6.25 MG tablet TAKE 1 TABLET BY MOUTH  TWICE A DAY WITH MEALS 180 tablet 1  . cholecalciferol (VITAMIN D) 1000 UNITS tablet Take 1,000 Units by mouth daily.     . folic acid (FOLVITE) 1 MG tablet Take 1 tablet by mouth daily    . furosemide (LASIX) 40 MG tablet TAKE 1 TABLET BY MOUTH TWO  TIMES DAILY (Patient taking differently: TAKE 1 TABLET BY MOUTH DAILY) 180 tablet 3  . lisinopril (PRINIVIL,ZESTRIL) 10 MG tablet TAKE 1 TABLET BY MOUTH  DAILY 90 tablet 3  . pantoprazole (PROTONIX) 40 MG tablet TAKE 1 TABLET BY MOUTH  EVERY DAY AT 12 NOON 90 tablet 6  . potassium chloride SA (KLOR-CON M20) 20 MEQ tablet Take 1 tablet (20 mEq total) by mouth daily. 90 tablet 3  . simvastatin (ZOCOR) 20 MG tablet TAKE 1 TABLET BY MOUTH AT  BEDTIME 90 tablet 2  . vitamin B-12 (CYANOCOBALAMIN) 1000 MCG tablet Take 1,000 mcg by mouth every other day.    . warfarin (COUMADIN) 5 MG tablet TAKE AS DIRECTED BY  ANTICOAGULATION CLINIC 110 tablet 1   No facility-administered medications prior to visit.     No Known Allergies  Review of Systems  Constitutional: Negative for fever and malaise/fatigue.  HENT: Negative for congestion.   Eyes: Negative for blurred vision.  Respiratory: Negative for cough and shortness of breath.   Cardiovascular: Negative  for chest pain, palpitations and leg swelling.  Gastrointestinal: Negative for vomiting.  Musculoskeletal: Negative for back pain.  Skin: Positive for rash.       Both legs. Left leg is worse. Rash from knee up to mid-thigh.  Neurological: Negative for loss of consciousness and headaches.       Objective:    Physical Exam  Constitutional: He is oriented to person, place, and time. He appears well-developed and well-nourished. No distress.  HENT:  Head: Normocephalic and atraumatic.  Eyes: Conjunctivae are normal.  Neck: Normal range of motion. No thyromegaly present.  Cardiovascular: Normal rate and regular rhythm.   Pulmonary/Chest: Effort normal and breath sounds normal. He has no wheezes.  Abdominal: Soft. Bowel sounds are normal. There is no tenderness.  Musculoskeletal: He exhibits no edema or deformity.  Neurological: He is alert and oriented to person, place, and time.  Skin: Skin is warm and dry. Rash noted. He is not diaphoretic.  Raised erythatous oval shaped lesions on b/l inner thighs  Psychiatric: He has a normal mood and affect.    BP 118/60 (BP Location: Left Arm, Patient Position: Sitting, Cuff Size: Normal)   Pulse 60   Temp 97.5 F (36.4 C) (Oral)   Wt 201 lb 9.6 oz (91.4 kg)   SpO2 95% Comment: RA  BMI 28.52 kg/m  Wt Readings from Last 3 Encounters:  04/05/16 201 lb 9.6 oz (91.4 kg)  01/12/16 195 lb (88.5 kg)  01/04/16 198 lb (89.8 kg)     Lab Results  Component Value Date   WBC 6.5 01/04/2016   HGB 14.9 01/04/2016   HCT 43.8 01/04/2016   PLT 91.0 (L) 01/04/2016   GLUCOSE 123 (H) 01/04/2016   CHOL 138 11/10/2015   TRIG 60.0 11/10/2015   HDL 62.20 11/10/2015   LDLDIRECT 81.6 12/30/2013   LDLCALC 64 11/10/2015   ALT 19 01/04/2016   AST 25 01/04/2016   NA 139 01/04/2016   K 4.0 01/04/2016   CL 102 01/04/2016  CREATININE 1.13 01/04/2016   BUN 9 01/04/2016   CO2 33 (H) 01/04/2016   TSH 2.40 11/10/2015   PSA 2.93 11/24/2010   INR 2.9  03/21/2016   HGBA1C 5.6 11/10/2015    Lab Results  Component Value Date   TSH 2.40 11/10/2015   Lab Results  Component Value Date   WBC 6.5 01/04/2016   HGB 14.9 01/04/2016   HCT 43.8 01/04/2016   MCV 89.6 01/04/2016   PLT 91.0 (L) 01/04/2016   Lab Results  Component Value Date   NA 139 01/04/2016   K 4.0 01/04/2016   CO2 33 (H) 01/04/2016   GLUCOSE 123 (H) 01/04/2016   BUN 9 01/04/2016   CREATININE 1.13 01/04/2016   BILITOT 3.6 (H) 01/04/2016   ALKPHOS 82 01/04/2016   AST 25 01/04/2016   ALT 19 01/04/2016   PROT 5.6 (L) 01/04/2016   ALBUMIN 3.4 (L) 01/04/2016   CALCIUM 9.1 01/04/2016   GFR 65.93 01/04/2016   Lab Results  Component Value Date   CHOL 138 11/10/2015   Lab Results  Component Value Date   HDL 62.20 11/10/2015   Lab Results  Component Value Date   LDLCALC 64 11/10/2015   Lab Results  Component Value Date   TRIG 60.0 11/10/2015   Lab Results  Component Value Date   CHOLHDL 2 11/10/2015   Lab Results  Component Value Date   HGBA1C 5.6 11/10/2015       Assessment & Plan:   Problem List Items Addressed This Visit    Vitamin B 12 deficiency    Was high on last check, will recheck today      Relevant Orders   Vitamin B12   Vitamin D deficiency    Check level today      Relevant Orders   VITAMIN D 25 Hydroxy (Vit-D Deficiency, Fractures)   Hypertension    Well controlled, no changes to meds. Encouraged heart healthy diet such as the DASH diet and exercise as tolerated.       Relevant Orders   CBC   Comprehensive metabolic panel   TSH   Hyperlipemia    Encouraged heart healthy diet, increase exercise, avoid trans fats, consider a krill oil cap daily      Relevant Orders   Lipid panel   Thrombocytopenia (HCC)    Asymptomatic, check CBC today      Relevant Orders   CBC   Comprehensive metabolic panel   TSH   Allergic rhinitis    Recent flare but not taking any antihistamines, encouraged to start Zyrtec qhs and report  worsening symptoms.      Cirrhosis, nonalcoholic (HCC)    Asymptomatic, check cmp today, minimize simple carbs. Recent ultrasound of abdomen showed no new concerns.       Dermatitis    Inner thighs, fungal appearing, given 1 dose of Diflucan and asked to hold Simvastatin for 2 days. Also start Nystatin cream bid, start a probiotic and report if worsens.        Other Visit Diagnoses    Hyperglycemia    -  Primary   Relevant Orders   Hemoglobin A1c   Itching          I am having Mr. No start on fluconazole and nystatin cream. I am also having him maintain his cholecalciferol, vitamin S-49, folic acid, potassium chloride SA, pantoprazole, lisinopril, furosemide, warfarin, carvedilol, simvastatin, and Zoster Vac Recomb Adjuvanted.  Meds ordered this encounter  Medications  . fluconazole (DIFLUCAN) 150  MG tablet    Sig: Take 1 tablet (150 mg total) by mouth daily.    Dispense:  1 tablet    Refill:  0  . nystatin cream (MYCOSTATIN)    Sig: Apply 1 application topically 2 (two) times daily.    Dispense:  30 g    Refill:  1  . DISCONTD: Zoster Vac Recomb Adjuvanted (SHINGRIX) 50 MCG SUSR    Sig: Inject 50 mcg into the muscle once.    Dispense:  1 each    Refill:  1  . Zoster Vac Recomb Adjuvanted (SHINGRIX) 50 MCG SUSR    Sig: Inject 50 mcg into the muscle once.    Dispense:  1 each    Refill:  1    CMA served as scribe during this visit. History, Physical and Plan performed by medical provider. Documentation and orders reviewed and attested to.  Penni Homans, MD

## 2016-04-05 NOTE — Patient Instructions (Addendum)
Take one over the counter Zyrtec (cetirizine) at bedtime for a couple of weeks to see if your head is less stuffy in the mornings. If no change, add 21m of Zantac at bedtime in addition to the Zyrtec. Also encouraged to NOW Probiotic into your daily regimen. On the day that you take the Diflucan, do NOT take the Simvastatin for two days. Hypertension Hypertension, commonly called high blood pressure, is when the force of blood pumping through the arteries is too strong. The arteries are the blood vessels that carry blood from the heart throughout the body. Hypertension forces the heart to work harder to pump blood and may cause arteries to become narrow or stiff. Having untreated or uncontrolled hypertension can cause heart attacks, strokes, kidney disease, and other problems. A blood pressure reading consists of a higher number over a lower number. Ideally, your blood pressure should be below 120/80. The first ("top") number is called the systolic pressure. It is a measure of the pressure in your arteries as your heart beats. The second ("bottom") number is called the diastolic pressure. It is a measure of the pressure in your arteries as the heart relaxes. What are the causes? The cause of this condition is not known. What increases the risk? Some risk factors for high blood pressure are under your control. Others are not. Factors you can change   Smoking.  Having type 2 diabetes mellitus, high cholesterol, or both.  Not getting enough exercise or physical activity.  Being overweight.  Having too much fat, sugar, calories, or salt (sodium) in your diet.  Drinking too much alcohol. Factors that are difficult or impossible to change   Having chronic kidney disease.  Having a family history of high blood pressure.  Age. Risk increases with age.  Race. You may be at higher risk if you are African-American.  Gender. Men are at higher risk than women before age 81 After age 81 women are  at higher risk than men.  Having obstructive sleep apnea.  Stress. What are the signs or symptoms? Extremely high blood pressure (hypertensive crisis) may cause:  Headache.  Anxiety.  Shortness of breath.  Nosebleed.  Nausea and vomiting.  Severe chest pain.  Jerky movements you cannot control (seizures). How is this diagnosed? This condition is diagnosed by measuring your blood pressure while you are seated, with your arm resting on a surface. The cuff of the blood pressure monitor will be placed directly against the skin of your upper arm at the level of your heart. It should be measured at least twice using the same arm. Certain conditions can cause a difference in blood pressure between your right and left arms. Certain factors can cause blood pressure readings to be lower or higher than normal (elevated) for a short period of time:  When your blood pressure is higher when you are in a health care provider's office than when you are at home, this is called white coat hypertension. Most people with this condition do not need medicines.  When your blood pressure is higher at home than when you are in a health care provider's office, this is called masked hypertension. Most people with this condition may need medicines to control blood pressure. If you have a high blood pressure reading during one visit or you have normal blood pressure with other risk factors:  You may be asked to return on a different day to have your blood pressure checked again.  You may be asked to monitor  your blood pressure at home for 1 week or longer. If you are diagnosed with hypertension, you may have other blood or imaging tests to help your health care provider understand your overall risk for other conditions. How is this treated? This condition is treated by making healthy lifestyle changes, such as eating healthy foods, exercising more, and reducing your alcohol intake. Your health care provider  may prescribe medicine if lifestyle changes are not enough to get your blood pressure under control, and if:  Your systolic blood pressure is above 130.  Your diastolic blood pressure is above 80. Your personal target blood pressure may vary depending on your medical conditions, your age, and other factors. Follow these instructions at home: Eating and drinking   Eat a diet that is high in fiber and potassium, and low in sodium, added sugar, and fat. An example eating plan is called the DASH (Dietary Approaches to Stop Hypertension) diet. To eat this way:  Eat plenty of fresh fruits and vegetables. Try to fill half of your plate at each meal with fruits and vegetables.  Eat whole grains, such as whole wheat pasta, brown rice, or whole grain bread. Fill about one quarter of your plate with whole grains.  Eat or drink low-fat dairy products, such as skim milk or low-fat yogurt.  Avoid fatty cuts of meat, processed or cured meats, and poultry with skin. Fill about one quarter of your plate with lean proteins, such as fish, chicken without skin, beans, eggs, and tofu.  Avoid premade and processed foods. These tend to be higher in sodium, added sugar, and fat.  Reduce your daily sodium intake. Most people with hypertension should eat less than 1,500 mg of sodium a day.  Limit alcohol intake to no more than 1 drink a day for nonpregnant women and 2 drinks a day for men. One drink equals 12 oz of beer, 5 oz of wine, or 1 oz of hard liquor. Lifestyle   Work with your health care provider to maintain a healthy body weight or to lose weight. Ask what an ideal weight is for you.  Get at least 30 minutes of exercise that causes your heart to beat faster (aerobic exercise) most days of the week. Activities may include walking, swimming, or biking.  Include exercise to strengthen your muscles (resistance exercise), such as pilates or lifting weights, as part of your weekly exercise routine. Try to  do these types of exercises for 30 minutes at least 3 days a week.  Do not use any products that contain nicotine or tobacco, such as cigarettes and e-cigarettes. If you need help quitting, ask your health care provider.  Monitor your blood pressure at home as told by your health care provider.  Keep all follow-up visits as told by your health care provider. This is important. Medicines   Take over-the-counter and prescription medicines only as told by your health care provider. Follow directions carefully. Blood pressure medicines must be taken as prescribed.  Do not skip doses of blood pressure medicine. Doing this puts you at risk for problems and can make the medicine less effective.  Ask your health care provider about side effects or reactions to medicines that you should watch for. Contact a health care provider if:  You think you are having a reaction to a medicine you are taking.  You have headaches that keep coming back (recurring).  You feel dizzy.  You have swelling in your ankles.  You have trouble with your  vision. Get help right away if:  You develop a severe headache or confusion.  You have unusual weakness or numbness.  You feel faint.  You have severe pain in your chest or abdomen.  You vomit repeatedly.  You have trouble breathing. Summary  Hypertension is when the force of blood pumping through your arteries is too strong. If this condition is not controlled, it may put you at risk for serious complications.  Your personal target blood pressure may vary depending on your medical conditions, your age, and other factors. For most people, a normal blood pressure is less than 120/80.  Hypertension is treated with lifestyle changes, medicines, or a combination of both. Lifestyle changes include weight loss, eating a healthy, low-sodium diet, exercising more, and limiting alcohol. This information is not intended to replace advice given to you by your health  care provider. Make sure you discuss any questions you have with your health care provider. Document Released: 01/17/2005 Document Revised: 12/16/2015 Document Reviewed: 12/16/2015 Elsevier Interactive Patient Education  2017 [employer].

## 2016-04-05 NOTE — Assessment & Plan Note (Signed)
Check level today

## 2016-04-05 NOTE — Assessment & Plan Note (Signed)
Well controlled, no changes to meds. Encouraged heart healthy diet such as the DASH diet and exercise as tolerated.  °

## 2016-04-05 NOTE — Assessment & Plan Note (Addendum)
Asymptomatic, check cmp today, minimize simple carbs. Recent ultrasound of abdomen showed no new concerns.

## 2016-04-05 NOTE — Assessment & Plan Note (Signed)
Was high on last check, will recheck today

## 2016-04-05 NOTE — Assessment & Plan Note (Signed)
Encouraged heart healthy diet, increase exercise, avoid trans fats, consider a krill oil cap daily 

## 2016-04-05 NOTE — Assessment & Plan Note (Signed)
Inner thighs, fungal appearing, given 1 dose of Diflucan and asked to hold Simvastatin for 2 days. Also start Nystatin cream bid, start a probiotic and report if worsens.

## 2016-04-05 NOTE — Assessment & Plan Note (Signed)
Recent flare but not taking any antihistamines, encouraged to start Zyrtec qhs and report worsening symptoms.

## 2016-04-20 ENCOUNTER — Encounter: Payer: Self-pay | Admitting: Cardiology

## 2016-04-20 ENCOUNTER — Ambulatory Visit (INDEPENDENT_AMBULATORY_CARE_PROVIDER_SITE_OTHER): Payer: Medicare HMO | Admitting: Cardiology

## 2016-04-20 VITALS — BP 124/68 | HR 70 | Ht 70.5 in | Wt 202.0 lb

## 2016-04-20 DIAGNOSIS — E782 Mixed hyperlipidemia: Secondary | ICD-10-CM

## 2016-04-20 DIAGNOSIS — I255 Ischemic cardiomyopathy: Secondary | ICD-10-CM | POA: Diagnosis not present

## 2016-04-20 DIAGNOSIS — Z95810 Presence of automatic (implantable) cardiac defibrillator: Secondary | ICD-10-CM

## 2016-04-20 DIAGNOSIS — I5022 Chronic systolic (congestive) heart failure: Secondary | ICD-10-CM | POA: Diagnosis not present

## 2016-04-20 DIAGNOSIS — I10 Essential (primary) hypertension: Secondary | ICD-10-CM

## 2016-04-20 DIAGNOSIS — I480 Paroxysmal atrial fibrillation: Secondary | ICD-10-CM

## 2016-04-20 NOTE — Patient Instructions (Signed)

## 2016-04-20 NOTE — Progress Notes (Signed)
Cardiology Office Note  Date:  04/20/2016   ID:  [name], DOB [birth year], MRN [id number]  PCP:  [name] Homans, MD  Cardiologist:  [name] Dawley, MD   Chief complain: 6 months follow up   History of Present Illness: [name] is a 83 y.o. male , previously patient of the [name], with h/o CAD, s/p CABG, s/p BiV ICD placement for ischemic CMP, LVEF 35%, his last catheterization in 2006 demonstrated a total right and 80% marginal. Vein graft to the diagonal was occluded and other bypasses were open; ejection fraction 2007 was about 35%  Nuclear scan Oct 2011>>scar no ischemia.  He has been last seen 6 months ago, today he states that he feels well, denies chest pain, DOE, orthopnea, PNE. He stays very active and gets [name] edema on the days when he walks more. Today increased swelling, he put compressions stockings on this am. No palpitations, ICD shocks or syncope.  Complaint with his meds.  10/22/2015 - the patient remains very active around his house, and has no chest pain and dyspnea only on moderate to severe exertion. He continues to have [name] edema toward the end of the day. He doesn't use compression stockings all the time especially in summer when its too hot. No ICD shocks, no syncope.  04/20/2016 - this is 6 months follow-up the patient states that he has been doing well and only gets chest pain when he has upper respiratory infection. Otherwise can still walk up the hill without chest pain. He feels like he has had mild lower extremity edema is less lower extremity for one week but no pain. He's been compliant with Coumadin and has no bleeding in his INR always between 2-3. He has no muscle pain with Zocor. He denies any palpitations he has had no ICD firing. No orthopnea or PND.  Past Medical History:  Diagnosis Date  . AAA (abdominal aortic aneurysm) ([place])   . Acute bronchitis 09/02/2013  . Acute vascular insufficiency of intestine (HCC)    Mesenteric embolus...surgery 2003    . Anxiety   . Atrial fibrillation (HCC)    paroxysmal  . Biventricular implantable cardiac defibrillator in situ    GDT CONTAK H170-MADIT-CRT-EXPLANTED 2011 implanted defibrillator- Guidant cognis, model n119-2001 Dr. Caryl [name] (11/28/2009)  . Breast tenderness    Spironolactone was stopped and eplerinone started. Patient feels better  . CAD (coronary artery disease)    CABG 1994  /   catheterization 1996, occluded vein graft to the diagonal, other grafts patent  /   catheterization 2006, no PCI  /  nuclear, October, 2011, extensive scar anteroseptal and apical, no ischemia  . Carotid artery disease ([place])    Doppler, February,  2012, 0-39% bilateral, recommended followup 1 year  . CHF (congestive heart failure) (HCC)    chronic systolic  . Chronotropic incompetence   . Colon polyp   . Colonic polyp 2010   pathology not clear.   . Crohn's disease ([place])   . Degenerative joint disease   . Dermatitis 04/05/2016  . Dizziness    Positional dizziness  . Edema 09/02/2013  . Edema leg    Venous Dopplers 8/13: No DVT bilaterally  . GERD (gastroesophageal reflux disease)   . Hx of CABG    1994  . Hyperlipemia   . Hypertension   . Hypertrophy of prostate with urinary obstruction and other lower urinary tract symptoms (LUTS)   . Iron deficiency anemia   . Ischemic cardiomyopathy  CABG 1994 CAD catherization 1996.[name] occluded vein graft to the diagnol, other grafts patent/  catherization 2006, no PCI/ nuclear.[name] october 2011.[name] extensive scar anteroseptal and apical.. no ischemia    . LBBB (left bundle branch block)   . Mitral regurgitation    mild.[name] echo november 2011  EF 35%...echo.. november 2009/ ef 35%.[name] echo november 2011  . Oral thrush 02/05/2015  . Polymyalgia rheumatica ([place])   . Shingles   . Sinus bradycardia   . Thrombocytopenia ([place]) 2008  . Thrombocytopenia ([place]) 11/30/2011  . Thrush   . Tremor   . Ventral hernia    multiple, wears an abd binder, reports he hs been told he was at  increased riisk for surgery  . Vitamin B12 deficiency   . Warfarin anticoagulation    Coumadin therapy for mesenteric embolus 2003    Past Surgical History:  Procedure Laterality Date  . CATARACT EXTRACTION, BILATERAL  2014  . COLONOSCOPY N/A 04/19/2013   Procedure: COLONOSCOPY;  Surgeon: [name] Bears, MD;  Location: [facility] ENDOSCOPY;  Service: Endoscopy;  Laterality: N/A;  . CORONARY ARTERY BYPASS GRAFT  1994  . Elap w/ superior mesenteric artery embolectomy  1/03   by Dr. Jennette [name]  . ESOPHAGOGASTRODUODENOSCOPY N/A 04/18/2013   Procedure: ESOPHAGOGASTRODUODENOSCOPY (EGD);  Surgeon: [name] Bears, MD;  Location: [facility] ENDOSCOPY;  Service: Endoscopy;  Laterality: N/A;  . implantation of biventric cardioverter-defibrillatorr     by Dr. Lovena [name] in 8/06 Guidant Contak H170-explanted 2011  . implantation of guidant cognis device     model n119-2011  . laparoscopic cholecystectomy  2002  . left inguinal hernia repair with mesh  6/08   by Dr. Betsy [name]  . PROSTATE SURGERY  08/2012   by urology in [place]  . Repair of incarcerated ventral hernia and lysis of adhesions  11/03   by Dr. Betsy [name]   Current Outpatient Prescriptions  Medication Sig Dispense Refill  . carvedilol (COREG) 6.25 MG tablet TAKE 1 TABLET BY MOUTH  TWICE A DAY WITH MEALS 180 tablet 1  . cholecalciferol (VITAMIN D) 1000 UNITS tablet Take 1,000 Units by mouth daily.     . fluconazole (DIFLUCAN) 150 MG tablet Take 1 tablet (150 mg total) by mouth daily. 1 tablet 0  . folic acid (FOLVITE) 1 MG tablet Take 1 tablet by mouth daily    . furosemide (LASIX) 40 MG tablet TAKE 1 TABLET BY MOUTH TWO  TIMES DAILY (Patient taking differently: TAKE 1 TABLET BY MOUTH DAILY) 180 tablet 3  . lisinopril (PRINIVIL,ZESTRIL) 10 MG tablet TAKE 1 TABLET BY MOUTH  DAILY 90 tablet 3  . nystatin cream (MYCOSTATIN) Apply 1 application topically 2 (two) times daily. 30 g 1  . pantoprazole (PROTONIX) 40 MG tablet TAKE 1 TABLET BY MOUTH  EVERY DAY AT 12 NOON 90  tablet 6  . potassium chloride SA (KLOR-CON M20) 20 MEQ tablet Take 1 tablet (20 mEq total) by mouth daily. 90 tablet 3  . simvastatin (ZOCOR) 20 MG tablet TAKE 1 TABLET BY MOUTH AT  BEDTIME 90 tablet 2  . vitamin B-12 (CYANOCOBALAMIN) 1000 MCG tablet Take 1,000 mcg by mouth every other day.    . warfarin (COUMADIN) 5 MG tablet TAKE AS DIRECTED BY  ANTICOAGULATION CLINIC 110 tablet 1   No current facility-administered medications for this visit.     Allergies:   Patient has no known allergies.    Social History:  The patient  reports that he quit smoking about 43 years ago. His smoking use included  Cigarettes. He has a 20.00 pack-year smoking history. He has never used smokeless tobacco. He reports that he drinks alcohol. He reports that he does not use drugs.   Family History:  The patient's family history includes Appendicitis in his brother; Cirrhosis in his brother; Dementia in his sister; Diabetes in his son; Edema in his sister; Heart disease in his mother, sister, sister, and sister; Heart disease (age of onset: 35) in his father; Heart failure in his sister; Hypertension in his brother, father, and sister; Kidney disease in his brother; Leukemia in his brother; Myasthenia gravis in his brother; Tremor in his sister.    ROS:  Please see the history of present illness.   Otherwise, review of systems are positive for none.   All other systems are reviewed and negative.   PHYSICAL EXAM: VS:  BP 124/68   Pulse 70   Ht 5' 10.5" (1.791 m)   Wt 202 lb (91.6 kg)   SpO2 94%   BMI 28.57 kg/m  , BMI Body mass index is 28.57 kg/m. GEN: Well nourished, well developed, in no acute distress  HEENT: normal  Neck: no JVD, carotid bruits, or masses Cardiac: RRR; no murmurs, rubs, or gallops, mild B/L [name] edema, ICD pocket no erythema or dicharge Respiratory:  clear to auscultation bilaterally, normal work of breathing GI: soft, nontender, nondistended, + BS MS: no deformity or atrophy  Skin:  warm and dry, no rash Neuro:  Strength and sensation are intact Psych: euthymic mood, full affect  EKG:  EKG is ordered today. The ekg ordered today demonstrates A-sensed BiV paced rhythm  Recent Labs: 04/05/2016: ALT 16; BUN 7; Creatinine, Ser 0.97; Hemoglobin 15.5; Platelets 72.0; Potassium 3.7; Sodium 141; TSH 1.46   Lipid Panel    Component Value Date/Time   CHOL 136 04/05/2016 1118   TRIG 195.0 (H) 04/05/2016 1118   HDL 45.10 04/05/2016 1118   CHOLHDL 3 04/05/2016 1118   VLDL 39.0 04/05/2016 1118   LDLCALC 52 04/05/2016 1118   LDLDIRECT 81.6 12/30/2013 1213   Wt Readings from Last 3 Encounters:  04/20/16 202 lb (91.6 kg)  04/05/16 201 lb 9.6 oz (91.4 kg)  01/12/16 195 lb (88.5 kg)    TTE: 11/03/2014 - Left ventricle: Hypokinesis of the basal and mid inferolateral   walls. The cavity size was mildly dilated. There was moderate   concentric hypertrophy. Systolic function was moderately reduced.   The estimated ejection fraction was in the range of 35% to 40%.   Wall motion was normal; there were no regional wall motion   abnormalities. Doppler parameters are consistent with abnormal   left ventricular relaxation (grade 1 diastolic dysfunction). - Aortic root: The aortic root was normal in size. - Left atrium: The atrium was moderately dilated. - Right ventricle: Systolic function was normal. - Right atrium: Pacer wire or catheter noted in right atrium. - Inferior vena cava: The vessel was not visualized. - Pericardium, extracardiac: There was no pericardial effusion. Impressions: - A limited study without use of Doppler.   LVEF 35-40%.   ASSESSMENT AND PLAN:  1. Acute on chronic combined systolic and diastolic CHF -  - appears euvolemic - he is complaining of LLE edema, but none on physical exam, also  o pain, no concern for DVT as he is fully anticoagulated, I will hold on Duplex for now.  2. Ischemic cardiomyopathy - LVEF 35-40% in 11/2014, asymptomatic, continue  using carvedilol, lisinopril, simvastatin  3. S/P BiV ICD placement - Implantable defibrillator-[place] ,  no ICD firing.  4. Hypertension - well controlled  5. Hyperlipidemia - on simvastatin, LDL, HDL at goal, TG mildly elevated, advised to limit carb intake.  6. PAF - in SR today, on warfarin, no bleeding.  Follow up in 6 months.  Signed, [name] Dawley, MD  04/20/2016 11:01 AM    [address], [place], [place]  [address] Phone: [phone]; Fax: [phone]

## 2016-05-02 ENCOUNTER — Ambulatory Visit (INDEPENDENT_AMBULATORY_CARE_PROVIDER_SITE_OTHER): Payer: Medicare HMO

## 2016-05-02 DIAGNOSIS — I5022 Chronic systolic (congestive) heart failure: Secondary | ICD-10-CM

## 2016-05-02 DIAGNOSIS — Z95810 Presence of automatic (implantable) cardiac defibrillator: Secondary | ICD-10-CM

## 2016-05-02 DIAGNOSIS — K55069 Acute infarction of intestine, part and extent unspecified: Secondary | ICD-10-CM

## 2016-05-02 LAB — POCT INR: INR: 3.9

## 2016-05-05 NOTE — Progress Notes (Signed)
EPIC Encounter for ICM Monitoring  Patient Name: [name] is a 83 y.o. male Date: 05/05/2016 Primary Care Physican: Penni Homans, MD Primary Cardiologist:Nelson Electrophysiologist: Caryl Comes Dry Weight:197 lbs Right Ventricular Pacing 96% and Left Ventricular Pacing 98%        Heart Failure questions reviewed, pt swelling of feet during the day but resolves after sleeping at night.   Latitude respiratory rate was 21and activity level 2.6 hours   Prescribed dosage: Furosemide 40 mg 1 tablet twice a day.  Potassium 20 mEq 1 tablet daily  Recommendations: No changes. Provided ICM direct number.  Encouraged to call for fluid symptoms.  Follow-up plan: ICM clinic phone appointment on 06/07/2016.   Copy of ICM check sent to device physician.    Rosalene Billings, RN 05/05/2016 11:41 AM

## 2016-05-10 ENCOUNTER — Other Ambulatory Visit: Payer: Self-pay | Admitting: Cardiology

## 2016-05-10 ENCOUNTER — Telehealth: Payer: Self-pay

## 2016-05-10 MED ORDER — SIMVASTATIN 20 MG PO TABS: 20.0000 mg | ORAL_TABLET | Freq: Every day | ORAL | 2 refills | Status: DC

## 2016-05-10 NOTE — Telephone Encounter (Signed)
Patient called in and stated that refill was not sent in on last office visit for simvastatin. 90 day supply was sent in to [facility] in [place].

## 2016-05-11 ENCOUNTER — Ambulatory Visit (INDEPENDENT_AMBULATORY_CARE_PROVIDER_SITE_OTHER): Payer: Medicare HMO | Admitting: *Deleted

## 2016-05-11 DIAGNOSIS — I255 Ischemic cardiomyopathy: Secondary | ICD-10-CM

## 2016-05-11 NOTE — Progress Notes (Signed)
Remote ICD transmission.   

## 2016-05-12 ENCOUNTER — Encounter: Payer: Self-pay | Admitting: Cardiology

## 2016-05-13 LAB — CUP PACEART REMOTE DEVICE CHECK
Battery Remaining Longevity: 48 mo
Date Time Interrogation Session: 20180411054100
HighPow Impedance: 60 Ohm
Implantable Lead Implant Date: 20060808
Implantable Lead Implant Date: 20060808
Implantable Lead Location: 753858
Implantable Lead Serial Number: 165392
Implantable Lead Serial Number: 261368
Implantable Pulse Generator Implant Date: 20111028
Lead Channel Impedance Value: 560 Ohm
Lead Channel Pacing Threshold Amplitude: 1 V
Lead Channel Pacing Threshold Pulse Width: 0.4 ms
Lead Channel Pacing Threshold Pulse Width: 0.4 ms
Lead Channel Setting Pacing Amplitude: 2 V
Lead Channel Setting Pacing Amplitude: 2.3 V
Lead Channel Setting Pacing Amplitude: 2.4 V
Lead Channel Setting Pacing Pulse Width: 0.8 ms
MDC IDC LEAD IMPLANT DT: 20060911
MDC IDC LEAD LOCATION: 753859
MDC IDC LEAD LOCATION: 753860
MDC IDC MSMT BATTERY REMAINING PERCENTAGE: 71 %
MDC IDC MSMT LEADCHNL LV IMPEDANCE VALUE: 428 Ohm
MDC IDC MSMT LEADCHNL LV PACING THRESHOLD AMPLITUDE: 0.7 V
MDC IDC MSMT LEADCHNL LV PACING THRESHOLD PULSEWIDTH: 0.8 ms
MDC IDC MSMT LEADCHNL RA IMPEDANCE VALUE: 468 Ohm
MDC IDC MSMT LEADCHNL RA PACING THRESHOLD AMPLITUDE: 1.1 V
MDC IDC SET LEADCHNL LV SENSING SENSITIVITY: 1 mV
MDC IDC SET LEADCHNL RV PACING PULSEWIDTH: 0.4 ms
MDC IDC SET LEADCHNL RV SENSING SENSITIVITY: 0.6 mV
MDC IDC STAT BRADY RA PERCENT PACED: 74 %
MDC IDC STAT BRADY RV PERCENT PACED: 96 %
Pulse Gen Serial Number: 490477

## 2016-05-17 ENCOUNTER — Other Ambulatory Visit: Payer: Self-pay | Admitting: *Deleted

## 2016-05-17 MED ORDER — WARFARIN SODIUM 5 MG PO TABS: 5.0000 mg | ORAL_TABLET | Freq: Every day | ORAL | 1 refills | Status: DC

## 2016-05-18 DIAGNOSIS — Z6828 Body mass index (BMI) 28.0-28.9, adult: Secondary | ICD-10-CM | POA: Diagnosis not present

## 2016-05-18 DIAGNOSIS — R011 Cardiac murmur, unspecified: Secondary | ICD-10-CM | POA: Diagnosis not present

## 2016-05-18 DIAGNOSIS — K08109 Complete loss of teeth, unspecified cause, unspecified class: Secondary | ICD-10-CM | POA: Diagnosis not present

## 2016-05-18 DIAGNOSIS — Z7901 Long term (current) use of anticoagulants: Secondary | ICD-10-CM | POA: Diagnosis not present

## 2016-05-18 DIAGNOSIS — Z79899 Other long term (current) drug therapy: Secondary | ICD-10-CM | POA: Diagnosis not present

## 2016-05-18 DIAGNOSIS — Z0000 Encounter for general adult medical examination without abnormal findings: Secondary | ICD-10-CM | POA: Diagnosis not present

## 2016-05-18 DIAGNOSIS — Z974 Presence of external hearing-aid: Secondary | ICD-10-CM | POA: Diagnosis not present

## 2016-05-18 DIAGNOSIS — I110 Hypertensive heart disease with heart failure: Secondary | ICD-10-CM | POA: Diagnosis not present

## 2016-05-18 DIAGNOSIS — I509 Heart failure, unspecified: Secondary | ICD-10-CM | POA: Diagnosis not present

## 2016-05-18 DIAGNOSIS — Z9849 Cataract extraction status, unspecified eye: Secondary | ICD-10-CM | POA: Diagnosis not present

## 2016-05-18 DIAGNOSIS — E785 Hyperlipidemia, unspecified: Secondary | ICD-10-CM | POA: Diagnosis not present

## 2016-05-18 DIAGNOSIS — H9113 Presbycusis, bilateral: Secondary | ICD-10-CM | POA: Diagnosis not present

## 2016-05-18 DIAGNOSIS — Z972 Presence of dental prosthetic device (complete) (partial): Secondary | ICD-10-CM | POA: Diagnosis not present

## 2016-05-24 ENCOUNTER — Other Ambulatory Visit: Payer: Self-pay | Admitting: *Deleted

## 2016-05-24 ENCOUNTER — Telehealth: Payer: Self-pay | Admitting: Family Medicine

## 2016-05-24 MED ORDER — FUROSEMIDE 40 MG PO TABS: 40.0000 mg | ORAL_TABLET | Freq: Two times a day (BID) | ORAL | 3 refills | Status: DC

## 2016-05-24 MED ORDER — PANTOPRAZOLE SODIUM 40 MG PO TBEC: DELAYED_RELEASE_TABLET | ORAL | 3 refills | Status: DC

## 2016-05-24 MED ORDER — LISINOPRIL 10 MG PO TABS: 10.0000 mg | ORAL_TABLET | Freq: Every day | ORAL | 3 refills | Status: DC

## 2016-05-24 MED ORDER — CARVEDILOL 6.25 MG PO TABS: 6.2500 mg | ORAL_TABLET | Freq: Two times a day (BID) | ORAL | 3 refills | Status: DC

## 2016-05-24 NOTE — Telephone Encounter (Signed)
Sent in carvedilol as requested. Called the patient to inform

## 2016-05-24 NOTE — Telephone Encounter (Signed)
Refill for carvedilol     Pharmacy: [facility] on [address], [place]

## 2016-05-26 ENCOUNTER — Encounter: Payer: Self-pay | Admitting: Cardiology

## 2016-05-30 ENCOUNTER — Ambulatory Visit (INDEPENDENT_AMBULATORY_CARE_PROVIDER_SITE_OTHER): Payer: Medicare HMO

## 2016-05-30 DIAGNOSIS — K55069 Acute infarction of intestine, part and extent unspecified: Secondary | ICD-10-CM | POA: Diagnosis not present

## 2016-05-30 LAB — POCT INR: INR: 2.8

## 2016-06-02 ENCOUNTER — Ambulatory Visit
Admission: RE | Admit: 2016-06-02 | Discharge: 2016-06-02 | Disposition: A | Discharge status: Home / Self Care | Payer: Medicare HMO | Source: Ambulatory Visit | Attending: Vascular Surgery | Admitting: Vascular Surgery

## 2016-06-02 ENCOUNTER — Encounter: Payer: Self-pay | Admitting: Vascular Surgery

## 2016-06-02 DIAGNOSIS — I714 Abdominal aortic aneurysm, without rupture, unspecified: Secondary | ICD-10-CM

## 2016-06-02 MED ORDER — IOPAMIDOL (ISOVUE-370) INJECTION 76%
75.0000 mL | Freq: Once | INTRAVENOUS | Status: AC | PRN
  Administered 2016-06-02: 75 mL via INTRAVENOUS

## 2016-06-06 ENCOUNTER — Other Ambulatory Visit: Payer: Self-pay | Admitting: Dermatology

## 2016-06-06 DIAGNOSIS — L821 Other seborrheic keratosis: Secondary | ICD-10-CM | POA: Diagnosis not present

## 2016-06-06 DIAGNOSIS — C44319 Basal cell carcinoma of skin of other parts of face: Secondary | ICD-10-CM | POA: Diagnosis not present

## 2016-06-06 DIAGNOSIS — L570 Actinic keratosis: Secondary | ICD-10-CM | POA: Diagnosis not present

## 2016-06-06 DIAGNOSIS — C44311 Basal cell carcinoma of skin of nose: Secondary | ICD-10-CM | POA: Diagnosis not present

## 2016-06-06 DIAGNOSIS — L814 Other melanin hyperpigmentation: Secondary | ICD-10-CM | POA: Diagnosis not present

## 2016-06-06 DIAGNOSIS — D1801 Hemangioma of skin and subcutaneous tissue: Secondary | ICD-10-CM | POA: Diagnosis not present

## 2016-06-07 ENCOUNTER — Ambulatory Visit (INDEPENDENT_AMBULATORY_CARE_PROVIDER_SITE_OTHER): Payer: Medicare HMO

## 2016-06-07 DIAGNOSIS — I5022 Chronic systolic (congestive) heart failure: Secondary | ICD-10-CM | POA: Diagnosis not present

## 2016-06-07 DIAGNOSIS — Z95810 Presence of automatic (implantable) cardiac defibrillator: Secondary | ICD-10-CM

## 2016-06-09 NOTE — Progress Notes (Signed)
EPIC Encounter for ICM Monitoring  Patient Name: [name] is a 83 y.o. male Date: 06/09/2016 Primary Care Physican: [name], MD Primary Cardiologist:Nelson Electrophysiologist: Faustino Congress Weight:197 lbs Right Ventricular Pacing 96% and Left Ventricular Pacing 98%       Heart Failure questions reviewed, pt swelling of feet during the day but resolves after sleeping at night.   Latitude respiratory rate was 21and activity level 2.1 hours   Prescribed dosage: Furosemide 40 mg 1 tablet twice a day. Potassium 20 mEq 1 tablet daily  Recommendations: No changes. Provided ICM direct number.  Encouraged to call for fluid symptoms.  Follow-up plan: ICM clinic phone appointment on 06/07/2016.   Copy of ICM check sent to device physician.       Rosalene Billings, RN 06/09/2016 1:11 PM

## 2016-06-10 ENCOUNTER — Encounter: Payer: Self-pay | Admitting: Vascular Surgery

## 2016-06-10 ENCOUNTER — Ambulatory Visit: Payer: Medicare Other | Admitting: Vascular Surgery

## 2016-06-10 ENCOUNTER — Ambulatory Visit (INDEPENDENT_AMBULATORY_CARE_PROVIDER_SITE_OTHER): Payer: Medicare HMO | Admitting: Vascular Surgery

## 2016-06-10 VITALS — BP 133/79 | HR 59 | Temp 97.4°F | Ht 71.0 in | Wt 203.8 lb

## 2016-06-10 DIAGNOSIS — I714 Abdominal aortic aneurysm, without rupture, unspecified: Secondary | ICD-10-CM

## 2016-06-10 NOTE — Progress Notes (Signed)
Patient ID: [name], male   DOB: March 27, 1931, 83 y.o.   MRN: [id number]  Reason for Consult: AAA   Referred by [name], MD  Subjective:     HPI:  [name] is a 83 y.o. male previous smoker with known abdominal aortic aneurysm. He has a remote history of an SMA thrombosis for which he underwent emergent exploratory laparoscopy and now has ventral hernias. He has done very well from that other than some intestinal blockages that he has recovered well from. At last visit he had a growth in his aortic aneurysm noted on ultrasound but this was minimally visualized. Prior to this visit is undergone CAT scan. He has no new abdominal or back pain. He is able to walk without issues. There are no complaints related to today's visit.  Past Medical History:  Diagnosis Date  . AAA (abdominal aortic aneurysm) ([place])   . Acute bronchitis 09/02/2013  . Acute vascular insufficiency of intestine (HCC)    Mesenteric embolus...surgery 2003  . Anxiety   . Atrial fibrillation (HCC)    paroxysmal  . Biventricular implantable cardiac defibrillator in situ    GDT CONTAK H170-MADIT-CRT-EXPLANTED 2011 implanted defibrillator- Guidant cognis, model n119-2001 Dr. Caryl Comes (11/28/2009)  . Breast tenderness    Spironolactone was stopped and eplerinone started. Patient feels better  . CAD (coronary artery disease)    CABG 1994  /   catheterization 1996, occluded vein graft to the diagonal, other grafts patent  /   catheterization 2006, no PCI  /  nuclear, October, 2011, extensive scar anteroseptal and apical, no ischemia  . Carotid artery disease ([place])    Doppler, February,  2012, 0-39% bilateral, recommended followup 1 year  . CHF (congestive heart failure) (HCC)    chronic systolic  . Chronotropic incompetence   . Colon polyp   . Colonic polyp 2010   pathology not clear.   . Crohn's disease ([place])   . Degenerative joint disease   . Dermatitis 04/05/2016  . Dizziness    Positional dizziness  .  Edema 09/02/2013  . Edema leg    Venous Dopplers 8/13: No DVT bilaterally  . GERD (gastroesophageal reflux disease)   . Hx of CABG    1994  . Hyperlipemia   . Hypertension   . Hypertrophy of prostate with urinary obstruction and other lower urinary tract symptoms (LUTS)   . Iron deficiency anemia   . Ischemic cardiomyopathy    CABG 1994 CAD catherization 1996.[name] occluded vein graft to the diagnol, other grafts patent/  catherization 2006, no PCI/ nuclear.[name] october 2011.[name] extensive scar anteroseptal and apical.. no ischemia    . LBBB (left bundle branch block)   . Mitral regurgitation    mild.[name] echo november 2011  EF 35%...echo.. november 2009/ ef 35%.[name] echo november 2011  . Oral thrush 02/05/2015  . Polymyalgia rheumatica ([place])   . Shingles   . Sinus bradycardia   . Thrombocytopenia ([place]) 2008  . Thrombocytopenia ([place]) 11/30/2011  . Thrush   . Tremor   . Ventral hernia    multiple, wears an abd binder, reports he hs been told he was at increased riisk for surgery  . Vitamin B12 deficiency   . Warfarin anticoagulation    Coumadin therapy for mesenteric embolus 2003   Family History  Problem Relation Age of Onset  . Heart disease Mother   . Tremor Sister   . Heart failure Sister   . Heart disease Father 33  . Hypertension  Father   . Appendicitis Brother        ruptured  . Diabetes Son   . Heart disease Sister   . Hypertension Sister   . Dementia Sister   . Heart disease Sister   . Hypertension Brother   . Kidney disease Brother   . Myasthenia gravis Brother   . Cirrhosis Brother   . Leukemia Brother   . Hypertension Unknown   . Hyperlipidemia Unknown   . Heart disease Sister   . Edema Sister        pedal edema   Past Surgical History:  Procedure Laterality Date  . CATARACT EXTRACTION, BILATERAL  2014  . COLONOSCOPY N/A 04/19/2013   Procedure: COLONOSCOPY;  Surgeon: Jerene Bears, MD;  Location: [facility] ENDOSCOPY;  Service: Endoscopy;  Laterality: N/A;  . CORONARY ARTERY  BYPASS GRAFT  1994  . Elap w/ superior mesenteric artery embolectomy  1/03   by Dr. Jennette Banker  . ESOPHAGOGASTRODUODENOSCOPY N/A 04/18/2013   Procedure: ESOPHAGOGASTRODUODENOSCOPY (EGD);  Surgeon: Jerene Bears, MD;  Location: [facility] ENDOSCOPY;  Service: Endoscopy;  Laterality: N/A;  . implantation of biventric cardioverter-defibrillatorr     by Dr. Lovena Le in 8/06 Guidant Contak H170-explanted 2011  . implantation of guidant cognis device     model n119-2011  . laparoscopic cholecystectomy  2002  . left inguinal hernia repair with mesh  6/08   by Dr. Betsy Pries  . PROSTATE SURGERY  08/2012   by urology in [place]  . Repair of incarcerated ventral hernia and lysis of adhesions  11/03   by Dr. Ray Church Social History:  Social History  Substance Use Topics  . Smoking status: Former Smoker    Packs/day: 1.00    Years: 20.00    Types: Cigarettes    Quit date: 01/31/1973  . Smokeless tobacco: Never Used  . Alcohol use 0.0 oz/week     Comment: occasional    No Known Allergies  Current Outpatient Prescriptions  Medication Sig Dispense Refill  . carvedilol (COREG) 6.25 MG tablet Take 1 tablet (6.25 mg total) by mouth 2 (two) times daily with a meal. 180 tablet 3  . cholecalciferol (VITAMIN D) 1000 UNITS tablet Take 1,000 Units by mouth daily.     . fluconazole (DIFLUCAN) 150 MG tablet Take 1 tablet (150 mg total) by mouth daily. 1 tablet 0  . folic acid (FOLVITE) 1 MG tablet Take 1 tablet by mouth daily    . furosemide (LASIX) 40 MG tablet Take 1 tablet (40 mg total) by mouth 2 (two) times daily. 180 tablet 3  . KLOR-CON M20 20 MEQ tablet TAKE ONE TABLET BY MOUTH ONCE DAILY 30 tablet 5  . lisinopril (PRINIVIL,ZESTRIL) 10 MG tablet Take 1 tablet (10 mg total) by mouth daily. 90 tablet 3  . nystatin cream (MYCOSTATIN) Apply 1 application topically 2 (two) times daily. 30 g 1  . pantoprazole (PROTONIX) 40 MG tablet TAKE 1 TABLET BY MOUTH  EVERY DAY AT 12 NOON 90 tablet 3  . simvastatin  (ZOCOR) 20 MG tablet Take 1 tablet (20 mg total) by mouth at bedtime. 90 tablet 2  . vitamin B-12 (CYANOCOBALAMIN) 1000 MCG tablet Take 1,000 mcg by mouth every other day.    . warfarin (COUMADIN) 5 MG tablet Take 1 tablet (5 mg total) by mouth daily. 110 tablet 1   No current facility-administered medications for this visit.     Review of Systems  Constitutional:  Constitutional negative. HENT: HENT negative.  Eyes:  Eyes negative.  Respiratory: Respiratory negative.  Cardiovascular: Positive for leg swelling.  GI: Positive for abdominal pain.  Musculoskeletal: Musculoskeletal negative.  Skin: Skin negative.  Neurological: Neurological negative. Hematologic: Hematologic/lymphatic negative.  Psychiatric: Psychiatric negative.        Objective:  Objective   Vitals:   06/10/16 1104  BP: 133/79  Pulse: (!) 59  Temp: 97.4 F (36.3 C)  SpO2: 96%  Weight: 203 lb 12.8 oz (92.4 kg)  Height: 5' 11"  (1.803 m)   Body mass index is 28.42 kg/m.  Physical Exam  Constitutional: He is oriented to person, place, and time. He appears well-developed.  HENT:  Head: Normocephalic.  Eyes: Pupils are equal, round, and reactive to light.  Neck: Normal range of motion.  Cardiovascular:  Pulses:      Carotid pulses are 2+ on the right side, and 2+ on the left side.      Radial pulses are 2+ on the right side, and 2+ on the left side.       Femoral pulses are 2+ on the right side, and 2+ on the left side.      Popliteal pulses are 2+ on the right side, and 2+ on the left side.  Pulmonary/Chest: Effort normal.  Abdominal: Soft.  Abdominal binder in place, no ttp  Musculoskeletal: Normal range of motion. He exhibits no edema.  Neurological: He is alert and oriented to person, place, and time.  Skin: Skin is warm and dry.  Psychiatric: He has a normal mood and affect. His behavior is normal. Judgment and thought content normal.    Data: IMPRESSION: VASCULAR  1. Stable to slightly  improved aneurysmal dilatation of the infrarenal abdominal aorta secondary to a focal penetrating aortic ulcer. The maximal aortic diameter today is 3.4 cm compared to 3.6 cm in January of 2015. 2. 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 Maximal diameter is 2.0 cm. 3. 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 4. 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      Assessment/Plan:    83 year old male here for follow-up of abdominal aortic aneurysm. Unfortunately his last ultrasound over estimated the size of his aneurysm and it is actually stable over the past 3 years. Given the size we'll follow him up in 2 years with repeat CT scan since the ultrasound was unable to accurately see it with his large abdomen and ventral hernias. We discussed the signs and symptoms and risk of rupture he demonstrates good understanding and we will see him back in 2 years.    Waynetta Sandy MD [facility]

## 2016-06-28 ENCOUNTER — Ambulatory Visit (INDEPENDENT_AMBULATORY_CARE_PROVIDER_SITE_OTHER): Payer: Medicare HMO | Admitting: *Deleted

## 2016-06-28 DIAGNOSIS — K55069 Acute infarction of intestine, part and extent unspecified: Secondary | ICD-10-CM

## 2016-06-28 LAB — POCT INR: INR: 3

## 2016-06-29 ENCOUNTER — Other Ambulatory Visit: Payer: Self-pay | Admitting: Cardiology

## 2016-07-07 DIAGNOSIS — H524 Presbyopia: Secondary | ICD-10-CM | POA: Diagnosis not present

## 2016-07-18 ENCOUNTER — Ambulatory Visit (INDEPENDENT_AMBULATORY_CARE_PROVIDER_SITE_OTHER): Payer: Medicare HMO

## 2016-07-18 ENCOUNTER — Other Ambulatory Visit: Payer: Self-pay | Admitting: Dermatology

## 2016-07-18 DIAGNOSIS — Z95810 Presence of automatic (implantable) cardiac defibrillator: Secondary | ICD-10-CM

## 2016-07-18 DIAGNOSIS — D045 Carcinoma in situ of skin of trunk: Secondary | ICD-10-CM | POA: Diagnosis not present

## 2016-07-18 DIAGNOSIS — L820 Inflamed seborrheic keratosis: Secondary | ICD-10-CM | POA: Diagnosis not present

## 2016-07-18 DIAGNOSIS — L570 Actinic keratosis: Secondary | ICD-10-CM | POA: Diagnosis not present

## 2016-07-18 DIAGNOSIS — I5022 Chronic systolic (congestive) heart failure: Secondary | ICD-10-CM

## 2016-07-18 DIAGNOSIS — C44529 Squamous cell carcinoma of skin of other part of trunk: Secondary | ICD-10-CM | POA: Diagnosis not present

## 2016-07-19 NOTE — Progress Notes (Signed)
EPIC Encounter for ICM Monitoring  Patient Name: [name] is a 83 y.o. male Date: 07/19/2016 Primary Care Physican: [name], MD Primary Cardiologist:[place] Electrophysiologist: Faustino Congress Weight:Last known weight 197 lbs Right Ventricular Pacing 96% and Left Ventricular Pacing 98%                                                             Heart Failure questions reviewed, pt denied any fluid symptoms but has had some nausea since yesterday.  He has been in the heat for a little while today. Advised not to be out in heat and if needs to get out to do so either early morning or late evening when it is a little cooler.   Latitude respiratory rate was 21 rpm and activity level 2.0 hours   Prescribed dosage: Furosemide 40 mg 1 tablet twice a day. Potassium 20 mEq 1 tablet daily  Recommendations:  No changes.   Encouraged to call for fluid symptoms.  Follow-up plan: ICM clinic phone appointment on 08/19/2016.    Copy of ICM check sent to device physician.        Rosalene Billings, RN 07/19/2016 1:52 PM

## 2016-07-25 ENCOUNTER — Ambulatory Visit (INDEPENDENT_AMBULATORY_CARE_PROVIDER_SITE_OTHER): Payer: Medicare HMO | Admitting: *Deleted

## 2016-07-25 DIAGNOSIS — K55069 Acute infarction of intestine, part and extent unspecified: Secondary | ICD-10-CM | POA: Diagnosis not present

## 2016-07-25 LAB — POCT INR: INR: 3.7

## 2016-08-08 ENCOUNTER — Ambulatory Visit (INDEPENDENT_AMBULATORY_CARE_PROVIDER_SITE_OTHER): Payer: Medicare HMO | Admitting: Family Medicine

## 2016-08-08 ENCOUNTER — Encounter: Payer: Self-pay | Admitting: Family Medicine

## 2016-08-08 VITALS — BP 120/68 | HR 62 | Temp 97.6°F | Resp 18 | Wt 191.8 lb

## 2016-08-08 DIAGNOSIS — E782 Mixed hyperlipidemia: Secondary | ICD-10-CM

## 2016-08-08 DIAGNOSIS — D696 Thrombocytopenia, unspecified: Secondary | ICD-10-CM

## 2016-08-08 DIAGNOSIS — E559 Vitamin D deficiency, unspecified: Secondary | ICD-10-CM

## 2016-08-08 DIAGNOSIS — M549 Dorsalgia, unspecified: Secondary | ICD-10-CM

## 2016-08-08 DIAGNOSIS — C4490 Unspecified malignant neoplasm of skin, unspecified: Secondary | ICD-10-CM | POA: Diagnosis not present

## 2016-08-08 DIAGNOSIS — M1990 Unspecified osteoarthritis, unspecified site: Secondary | ICD-10-CM

## 2016-08-08 DIAGNOSIS — R739 Hyperglycemia, unspecified: Secondary | ICD-10-CM | POA: Diagnosis not present

## 2016-08-08 DIAGNOSIS — I10 Essential (primary) hypertension: Secondary | ICD-10-CM

## 2016-08-08 DIAGNOSIS — E538 Deficiency of other specified B group vitamins: Secondary | ICD-10-CM | POA: Diagnosis not present

## 2016-08-08 DIAGNOSIS — M545 Low back pain: Secondary | ICD-10-CM | POA: Diagnosis not present

## 2016-08-08 DIAGNOSIS — Z7901 Long term (current) use of anticoagulants: Secondary | ICD-10-CM | POA: Diagnosis not present

## 2016-08-08 HISTORY — DX: Dorsalgia, unspecified: M54.9

## 2016-08-08 HISTORY — DX: Hyperglycemia, unspecified: R73.9

## 2016-08-08 HISTORY — DX: Unspecified malignant neoplasm of skin, unspecified: C44.90

## 2016-08-08 LAB — LIPID PANEL
CHOL/HDL RATIO: 3
Cholesterol: 131 mg/dL (ref 0–200)
HDL: 52.1 mg/dL (ref >39.00)
LDL Cholesterol: 58 mg/dL (ref 0–99)
NONHDL: 79.35
TRIGLYCERIDES: 106 mg/dL (ref 0.0–149.0)
VLDL: 21.2 mg/dL (ref 0.0–40.0)

## 2016-08-08 LAB — COMPREHENSIVE METABOLIC PANEL
ALT: 18 U/L (ref 0–53)
AST: 30 U/L (ref 0–37)
Albumin: 3.8 g/dL (ref 3.5–5.2)
Alkaline Phosphatase: 83 U/L (ref 39–117)
BILIRUBIN TOTAL: 3.2 mg/dL — AB (ref 0.2–1.2)
BUN: 9 mg/dL (ref 6–23)
CALCIUM: 9.6 mg/dL (ref 8.4–10.5)
CO2: 34 meq/L — AB (ref 19–32)
Chloride: 100 mEq/L (ref 96–112)
Creatinine, Ser: 1.12 mg/dL (ref 0.40–1.50)
GFR: 66.52 mL/min (ref >60.00)
GLUCOSE: 95 mg/dL (ref 70–99)
Potassium: 4.2 mEq/L (ref 3.5–5.1)
Sodium: 138 mEq/L (ref 135–145)
Total Protein: 5.9 g/dL — ABNORMAL LOW (ref 6.0–8.3)

## 2016-08-08 LAB — CBC
HCT: 43.7 % (ref 39.0–52.0)
Hemoglobin: 14.8 g/dL (ref 13.0–17.0)
MCHC: 33.9 g/dL (ref 30.0–36.0)
MCV: 89.5 fl (ref 78.0–100.0)
PLATELETS: 57 10*3/uL — AB (ref 150.0–400.0)
RBC: 4.89 Mil/uL (ref 4.22–5.81)
RDW: 14.5 % (ref 11.5–15.5)
WBC: 5.1 10*3/uL (ref 4.0–10.5)

## 2016-08-08 LAB — TSH: TSH: 2.93 u[IU]/mL (ref 0.35–4.50)

## 2016-08-08 LAB — VITAMIN D 25 HYDROXY (VIT D DEFICIENCY, FRACTURES): VITD: 42.97 ng/mL (ref 30.00–100.00)

## 2016-08-08 LAB — HEMOGLOBIN A1C: HEMOGLOBIN A1C: 5.6 % (ref 4.6–6.5)

## 2016-08-08 MED ORDER — TIZANIDINE HCL 2 MG PO TABS: 2.0000 mg | ORAL_TABLET | Freq: Every evening | ORAL | 0 refills | Status: DC | PRN

## 2016-08-08 MED ORDER — SIMVASTATIN 20 MG PO TABS: 20.0000 mg | ORAL_TABLET | Freq: Every day | ORAL | 2 refills | Status: DC

## 2016-08-08 MED ORDER — CARVEDILOL 6.25 MG PO TABS: 6.2500 mg | ORAL_TABLET | Freq: Two times a day (BID) | ORAL | 3 refills | Status: DC

## 2016-08-08 NOTE — Assessment & Plan Note (Signed)
Encouraged moist heat and gentle stretching as tolerated and report if symptoms worsen or seek immediate care. Tylenol bid, lidocaine prn. Tizanidine 2 mg qhs prn. Call if persists and consider referral

## 2016-08-08 NOTE — Patient Instructions (Signed)
Try Tylenol ES 500 mg tabs, 1-2 tabs twice daily Lidocaine gel or patches daily Back Pain, Adult Back pain is very common. The pain often gets better over time. The cause of back pain is usually not dangerous. Most people can learn to manage their back pain on their own. Follow these instructions at home: Watch your back pain for any changes. The following actions may help to lessen any pain you are feeling:  Stay active. Start with short walks on flat ground if you can. Try to walk farther each day.  Exercise regularly as told by your doctor. Exercise helps your back heal faster. It also helps avoid future injury by keeping your muscles strong and flexible.  Do not sit, drive, or stand in one place for more than 30 minutes.  Do not stay in bed. Resting more than 1-2 days can slow down your recovery.  Be careful when you bend or lift an object. Use good form when lifting: ? Bend at your knees. ? Keep the object close to your body. ? Do not twist.  Sleep on a firm mattress. Lie on your side, and bend your knees. If you lie on your back, put a pillow under your knees.  Take medicines only as told by your doctor.  Put ice on the injured area. ? Put ice in a plastic bag. ? Place a towel between your skin and the bag. ? Leave the ice on for 20 minutes, 2-3 times a day for the first 2-3 days. After that, you can switch between ice and heat packs.  Avoid feeling anxious or stressed. Find good ways to deal with stress, such as exercise.  Maintain a healthy weight. Extra weight puts stress on your back.  Contact a doctor if:  You have pain that does not go away with rest or medicine.  You have worsening pain that goes down into your legs or buttocks.  You have pain that does not get better in one week.  You have pain at night.  You lose weight.  You have a fever or chills. Get help right away if:  You cannot control when you poop (bowel movement) or pee (urinate).  Your arms  or legs feel weak.  Your arms or legs lose feeling (numbness).  You feel sick to your stomach (nauseous) or throw up (vomit).  You have belly (abdominal) pain.  You feel like you may pass out (faint). This information is not intended to replace advice given to you by your health care provider. Make sure you discuss any questions you have with your health care provider. Document Released: 07/06/2007 Document Revised: 06/25/2015 Document Reviewed: 05/21/2013 Elsevier Interactive Patient Education  [employer].

## 2016-08-08 NOTE — Assessment & Plan Note (Signed)
Encouraged heart healthy diet, increase exercise, avoid trans fats, consider a krill oil cap daily 

## 2016-08-08 NOTE — Assessment & Plan Note (Addendum)
Platelets down slightly and this has been present for a couple of years. Asymptomatic but with worsening patient offered a referral to hematology for consultation and monitoring.

## 2016-08-08 NOTE — Assessment & Plan Note (Signed)
Continue supplements

## 2016-08-08 NOTE — Assessment & Plan Note (Signed)
Check level today

## 2016-08-08 NOTE — Assessment & Plan Note (Signed)
Well controlled, no changes to meds. Encouraged heart healthy diet such as the DASH diet and exercise as tolerated.  °

## 2016-08-08 NOTE — Progress Notes (Signed)
Subjective:  I acted as a Education administrator for Dr. Charlett Blake. [place], [place]  Patient ID: [name], male    DOB: February 11, 1931, 83 y.o.   MRN: [id number]  No chief complaint on file.   HPI  Patient is in today for a 4 month follow up. Patient presents with no acute concerns today. He is following up on his HTn, hyperlipidemia and other medical concerns. No recent febrile illness or acute hospitalizations. Denies CP/palp/SOB/HA/congestion/fevers/GI or GU c/o. Taking meds as prescribed He is accompanied by his wife and they report he is doing well. He is struggling with some left sided midback pain. They acknowledge she has been working on his car and he strained his back while doing this. It is slowly improving but it does worsen with certain movements. There are no radicular symptoms or incontinence. No falls or trauma.   Patient Care Team: [name], MD as PCP - General (Family Medicine) Pyrtle, Lajuan Lines, MD as Consulting Physician (Gastroenterology) Darleen Crocker, MD as Consulting Physician (Ophthalmology) Dorothy Spark, MD as Consulting Physician (Cardiology) Veva Holes as Consulting Physician (Urology) Waynetta Sandy, MD as Consulting Physician (Vascular Surgery) Deboraha Sprang, MD as Consulting Physician (Cardiology)   Past Medical History:  Diagnosis Date  . AAA (abdominal aortic aneurysm) ([place])   . Acute bronchitis 09/02/2013  . Acute vascular insufficiency of intestine (HCC)    Mesenteric embolus...surgery 2003  . Anxiety   . Atrial fibrillation (HCC)    paroxysmal  . Back pain 08/08/2016  . Biventricular implantable cardiac defibrillator in situ    GDT CONTAK H170-MADIT-CRT-EXPLANTED 2011 implanted defibrillator- Guidant cognis, model n119-2001 Dr. Caryl Comes (11/28/2009)  . Breast tenderness    Spironolactone was stopped and eplerinone started. Patient feels better  . CAD (coronary artery disease)    CABG 1994  /   catheterization 1996, occluded vein graft to  the diagonal, other grafts patent  /   catheterization 2006, no PCI  /  nuclear, October, 2011, extensive scar anteroseptal and apical, no ischemia  . Carotid artery disease ([place])    Doppler, February,  2012, 0-39% bilateral, recommended followup 1 year  . CHF (congestive heart failure) (HCC)    chronic systolic  . Chronotropic incompetence   . Colon polyp   . Colonic polyp 2010   pathology not clear.   . Crohn's disease ([place])   . Degenerative joint disease   . Dermatitis 04/05/2016  . Dizziness    Positional dizziness  . Edema 09/02/2013  . Edema leg    Venous Dopplers 8/13: No DVT bilaterally  . GERD (gastroesophageal reflux disease)   . Hx of CABG    1994  . Hyperglycemia 08/08/2016  . Hyperlipemia   . Hypertension   . Hypertrophy of prostate with urinary obstruction and other lower urinary tract symptoms (LUTS)   . Iron deficiency anemia   . Ischemic cardiomyopathy    CABG 1994 CAD catherization 1996.[name] occluded vein graft to the diagnol, other grafts patent/  catherization 2006, no PCI/ nuclear.[name] october 2011.[name] extensive scar anteroseptal and apical.. no ischemia    . LBBB (left bundle branch block)   . Mitral regurgitation    mild.[name] echo november 2011  EF 35%...echo.. november 2009/ ef 35%.[name] echo november 2011  . Oral thrush 02/05/2015  . Polymyalgia rheumatica ([place])   . Shingles   . Sinus bradycardia   . Skin cancer 08/08/2016  . Thrombocytopenia ([place]) 2008  . Thrombocytopenia ([place]) 11/30/2011  . Thrush   .  Tremor   . Ventral hernia    multiple, wears an abd binder, reports he hs been told he was at increased riisk for surgery  . Vitamin B12 deficiency   . Warfarin anticoagulation    Coumadin therapy for mesenteric embolus 2003    Past Surgical History:  Procedure Laterality Date  . CATARACT EXTRACTION, BILATERAL  2014  . COLONOSCOPY N/A 04/19/2013   Procedure: COLONOSCOPY;  Surgeon: Jerene Bears, MD;  Location: [facility] ENDOSCOPY;  Service: Endoscopy;  Laterality: N/A;  .  CORONARY ARTERY BYPASS GRAFT  1994  . Elap w/ superior mesenteric artery embolectomy  1/03   by Dr. Jennette Banker  . ESOPHAGOGASTRODUODENOSCOPY N/A 04/18/2013   Procedure: ESOPHAGOGASTRODUODENOSCOPY (EGD);  Surgeon: Jerene Bears, MD;  Location: [facility] ENDOSCOPY;  Service: Endoscopy;  Laterality: N/A;  . implantation of biventric cardioverter-defibrillatorr     by Dr. Lovena Le in 8/06 Guidant Contak H170-explanted 2011  . implantation of guidant cognis device     model n119-2011  . laparoscopic cholecystectomy  2002  . left inguinal hernia repair with mesh  6/08   by Dr. Betsy Pries  . PROSTATE SURGERY  08/2012   by urology in [place]  . Repair of incarcerated ventral hernia and lysis of adhesions  11/03   by Dr. Betsy Pries    Family History  Problem Relation Age of Onset  . Heart disease Mother   . Tremor Sister   . Heart failure Sister   . Heart disease Father 82  . Hypertension Father   . Appendicitis Brother        ruptured  . Diabetes Son   . Heart disease Sister   . Hypertension Sister   . Dementia Sister   . Heart disease Sister   . Hypertension Brother   . Kidney disease Brother   . Myasthenia gravis Brother   . Cirrhosis Brother   . Leukemia Brother   . Hypertension Unknown   . Hyperlipidemia Unknown   . Heart disease Sister   . Edema Sister        pedal edema    Social History   Social History  . Marital status: Married    Spouse name: Kennyth Lose  . Number of children: 3  . Years of education: N/A   Occupational History  . bull dozer driver   . farmer Retired  . logger    Social History Main Topics  . Smoking status: Former Smoker    Packs/day: 1.00    Years: 20.00    Types: Cigarettes    Quit date: 01/31/1973  . Smokeless tobacco: Never Used  . Alcohol use 0.0 oz/week     Comment: occasional  . Drug use: No  . Sexual activity: No     Comment: lives with wife, no dietary restrictions   Other Topics Concern  . Not on file   Social History Narrative   Lives in  [place], [place] with wife.     Outpatient Medications Prior to Visit  Medication Sig Dispense Refill  . cholecalciferol (VITAMIN D) 1000 UNITS tablet Take 1,000 Units by mouth daily.     . folic acid (FOLVITE) 1 MG tablet Take 1 tablet by mouth daily    . furosemide (LASIX) 40 MG tablet Take 1 tablet (40 mg total) by mouth 2 (two) times daily. 180 tablet 3  . KLOR-CON M20 20 MEQ tablet TAKE ONE TABLET BY MOUTH ONCE DAILY 90 tablet 2  . lisinopril (PRINIVIL,ZESTRIL) 10 MG tablet Take 1 tablet (10 mg total) by  mouth daily. 90 tablet 3  . pantoprazole (PROTONIX) 40 MG tablet TAKE 1 TABLET BY MOUTH  EVERY DAY AT 12 NOON 90 tablet 3  . vitamin B-12 (CYANOCOBALAMIN) 1000 MCG tablet Take 1,000 mcg by mouth every other day.    . warfarin (COUMADIN) 5 MG tablet Take 1 tablet (5 mg total) by mouth daily. 110 tablet 1  . carvedilol (COREG) 6.25 MG tablet Take 1 tablet (6.25 mg total) by mouth 2 (two) times daily with a meal. 180 tablet 3  . fluconazole (DIFLUCAN) 150 MG tablet Take 1 tablet (150 mg total) by mouth daily. 1 tablet 0  . nystatin cream (MYCOSTATIN) Apply 1 application topically 2 (two) times daily. 30 g 1  . simvastatin (ZOCOR) 20 MG tablet Take 1 tablet (20 mg total) by mouth at bedtime. 90 tablet 2   No facility-administered medications prior to visit.     No Known Allergies  Review of Systems  Constitutional: Negative for fever and malaise/fatigue.  HENT: Negative for congestion.   Eyes: Negative for blurred vision.  Respiratory: Negative for cough and shortness of breath.   Cardiovascular: Negative for chest pain, palpitations and leg swelling.  Gastrointestinal: Negative for vomiting.  Musculoskeletal: Positive for back pain.  Skin: Negative for rash.  Neurological: Negative for loss of consciousness and headaches.       Objective:    Physical Exam  Constitutional: He is oriented to person, place, and time. He appears well-developed and well-nourished. No distress.  HENT:   Head: Normocephalic and atraumatic.  Eyes: Conjunctivae are normal.  Neck: Normal range of motion. No thyromegaly present.  Cardiovascular: Normal rate and regular rhythm.   Pulmonary/Chest: Effort normal and breath sounds normal. He has no wheezes.  Abdominal: Soft. Bowel sounds are normal. There is no tenderness.  Musculoskeletal: Normal range of motion. He exhibits no edema or deformity.  Neurological: He is alert and oriented to person, place, and time.  Skin: Skin is warm and dry. He is not diaphoretic.  Psychiatric: He has a normal mood and affect.    BP 120/68 (BP Location: Left Arm, Patient Position: Sitting, Cuff Size: Normal)   Pulse 62   Temp 97.6 F (36.4 C) (Oral)   Resp 18   Wt 191 lb 12.8 oz (87 kg)   SpO2 97%   BMI 26.75 kg/m  Wt Readings from Last 3 Encounters:  08/08/16 191 lb 12.8 oz (87 kg)  06/10/16 203 lb 12.8 oz (92.4 kg)  04/20/16 202 lb (91.6 kg)   BP Readings from Last 3 Encounters:  08/08/16 120/68  06/10/16 133/79  04/20/16 124/68     Immunization History  Administered Date(s) Administered  . Influenza Split 11/24/2010, 11/30/2011, 10/31/2012  . Influenza Whole 11/06/2008  . Influenza, High Dose Seasonal PF 11/10/2015  . Influenza,inj,Quad PF,36+ Mos 12/30/2013, 12/09/2014  . Pneumococcal Conjugate-13 12/09/2014  . Pneumococcal Polysaccharide-23 03/11/2013  . Tdap 03/11/2013    Health Maintenance  Topic Date Due  . INFLUENZA VACCINE  08/31/2016  . COLONOSCOPY  04/20/2018  . TETANUS/TDAP  03/12/2023  . PNA vac Low Risk Adult  Completed    Lab Results  Component Value Date   WBC 5.1 08/08/2016   HGB 14.8 08/08/2016   HCT 43.7 08/08/2016   PLT 57.0 (L) 08/08/2016   GLUCOSE 95 08/08/2016   CHOL 131 08/08/2016   TRIG 106.0 08/08/2016   HDL 52.10 08/08/2016   LDLDIRECT 81.6 12/30/2013   LDLCALC 58 08/08/2016   ALT 18 08/08/2016   AST  30 08/08/2016   NA 138 08/08/2016   K 4.2 08/08/2016   CL 100 08/08/2016   CREATININE 1.12  08/08/2016   BUN 9 08/08/2016   CO2 34 (H) 08/08/2016   TSH 2.93 08/08/2016   PSA 2.93 11/24/2010   INR 3.7 07/25/2016   HGBA1C 5.6 08/08/2016    Lab Results  Component Value Date   TSH 2.93 08/08/2016   Lab Results  Component Value Date   WBC 5.1 08/08/2016   HGB 14.8 08/08/2016   HCT 43.7 08/08/2016   MCV 89.5 08/08/2016   PLT 57.0 (L) 08/08/2016   Lab Results  Component Value Date   NA 138 08/08/2016   K 4.2 08/08/2016   CO2 34 (H) 08/08/2016   GLUCOSE 95 08/08/2016   BUN 9 08/08/2016   CREATININE 1.12 08/08/2016   BILITOT 3.2 (H) 08/08/2016   ALKPHOS 83 08/08/2016   AST 30 08/08/2016   ALT 18 08/08/2016   PROT 5.9 (L) 08/08/2016   ALBUMIN 3.8 08/08/2016   CALCIUM 9.6 08/08/2016   GFR 66.52 08/08/2016   Lab Results  Component Value Date   CHOL 131 08/08/2016   Lab Results  Component Value Date   HDL 52.10 08/08/2016   Lab Results  Component Value Date   LDLCALC 58 08/08/2016   Lab Results  Component Value Date   TRIG 106.0 08/08/2016   Lab Results  Component Value Date   CHOLHDL 3 08/08/2016   Lab Results  Component Value Date   HGBA1C 5.6 08/08/2016         Assessment & Plan:   Problem List Items Addressed This Visit    Vitamin B 12 deficiency    Check level today      Relevant Orders   Vitamin B12 (Completed)   Vitamin D deficiency    Continue supplements      Relevant Orders   VITAMIN D 25 Hydroxy (Vit-D Deficiency, Fractures) (Completed)   RESOLVED: Osteoarthritis    Recent flare in low back pain while working in garden. Denies any recent trauma. Encouraged moist heat and gentle stretching as tolerated. May try NSAIDs and prescription meds as directed and report if symptoms worsen or seek immediate care      Relevant Medications   tiZANidine (ZANAFLEX) 2 MG tablet   Hypertension - Primary    Well controlled, no changes to meds. Encouraged heart healthy diet such as the DASH diet and exercise as tolerated.       Relevant  Medications   carvedilol (COREG) 6.25 MG tablet   simvastatin (ZOCOR) 20 MG tablet   Other Relevant Orders   CBC (Completed)   Comprehensive metabolic panel (Completed)   TSH (Completed)   Hyperlipemia    Encouraged heart healthy diet, increase exercise, avoid trans fats, consider a krill oil cap daily      Relevant Medications   carvedilol (COREG) 6.25 MG tablet   simvastatin (ZOCOR) 20 MG tablet   Other Relevant Orders   Lipid panel (Completed)   Warfarin anticoagulation    Tolerating coumadin      Thrombocytopenia (HCC)    Platelets down slightly and this has been present for a couple of years. Asymptomatic but with worsening patient offered a referral to hematology for consultation and monitoring.       Skin cancer    Melanoma removed back of left leg at calf Basal cell carcinoma spots recently removed from face and chest Dr Allyson Sabal      Hyperglycemia    minimize  simple carbs. Increase exercise as tolerated.       Relevant Orders   Hemoglobin A1c (Completed)   Back pain    Encouraged moist heat and gentle stretching as tolerated and report if symptoms worsen or seek immediate care. Tylenol bid, lidocaine prn. Tizanidine 2 mg qhs prn. Call if persists and consider referral      Relevant Medications   tiZANidine (ZANAFLEX) 2 MG tablet      I have discontinued Mr. Gauthreaux fluconazole and nystatin cream. I am also having him start on tiZANidine. Additionally, I am having him maintain his cholecalciferol, vitamin F-02, folic acid, warfarin, furosemide, lisinopril, pantoprazole, KLOR-CON M20, carvedilol, and simvastatin.  Meds ordered this encounter  Medications  . carvedilol (COREG) 6.25 MG tablet    Sig: Take 1 tablet (6.25 mg total) by mouth 2 (two) times daily with a meal.    Dispense:  180 tablet    Refill:  3  . simvastatin (ZOCOR) 20 MG tablet    Sig: Take 1 tablet (20 mg total) by mouth at bedtime.    Dispense:  90 tablet    Refill:  2  . tiZANidine  (ZANAFLEX) 2 MG tablet    Sig: Take 1 tablet (2 mg total) by mouth at bedtime as needed for muscle spasms.    Dispense:  10 tablet    Refill:  0    CMA served as scribe during this visit. History, Physical and Plan performed by medical provider. Documentation and orders reviewed and attested to.  Penni Homans, MD

## 2016-08-08 NOTE — Assessment & Plan Note (Signed)
minimize simple carbs. Increase exercise as tolerated.  

## 2016-08-08 NOTE — Assessment & Plan Note (Signed)
Melanoma removed back of left leg at calf Basal cell carcinoma spots recently removed from face and chest Dr Allyson Sabal

## 2016-08-08 NOTE — Assessment & Plan Note (Signed)
Tolerating coumadin

## 2016-08-08 NOTE — Assessment & Plan Note (Signed)
Recent flare in low back pain while working in garden. Denies any recent trauma. Encouraged moist heat and gentle stretching as tolerated. May try NSAIDs and prescription meds as directed and report if symptoms worsen or seek immediate care

## 2016-08-09 LAB — VITAMIN B12: VITAMIN B 12: 1099 pg/mL (ref 200–1100)

## 2016-08-10 ENCOUNTER — Ambulatory Visit (INDEPENDENT_AMBULATORY_CARE_PROVIDER_SITE_OTHER): Payer: Medicare HMO | Admitting: *Deleted

## 2016-08-10 DIAGNOSIS — I255 Ischemic cardiomyopathy: Secondary | ICD-10-CM

## 2016-08-10 LAB — CUP PACEART REMOTE DEVICE CHECK
Battery Remaining Longevity: 42 mo
Battery Remaining Percentage: 63 %
Brady Statistic RA Percent Paced: 79 %
Date Time Interrogation Session: 20180711054200
HighPow Impedance: 61 Ohm
Implantable Lead Implant Date: 20060808
Implantable Lead Implant Date: 20060808
Implantable Lead Location: 753858
Implantable Lead Location: 753860
Implantable Lead Serial Number: 165392
Lead Channel Impedance Value: 450 Ohm
Lead Channel Impedance Value: 479 Ohm
Lead Channel Pacing Threshold Amplitude: 0.7 V
Lead Channel Pacing Threshold Pulse Width: 0.4 ms
Lead Channel Pacing Threshold Pulse Width: 0.8 ms
Lead Channel Setting Pacing Pulse Width: 0.8 ms
Lead Channel Setting Sensing Sensitivity: 0.6 mV
Lead Channel Setting Sensing Sensitivity: 1 mV
MDC IDC LEAD IMPLANT DT: 20060911
MDC IDC LEAD LOCATION: 753859
MDC IDC LEAD SERIAL: 261368
MDC IDC MSMT LEADCHNL RA PACING THRESHOLD AMPLITUDE: 1.1 V
MDC IDC MSMT LEADCHNL RA PACING THRESHOLD PULSEWIDTH: 0.4 ms
MDC IDC MSMT LEADCHNL RV IMPEDANCE VALUE: 623 Ohm
MDC IDC MSMT LEADCHNL RV PACING THRESHOLD AMPLITUDE: 1 V
MDC IDC PG IMPLANT DT: 20111028
MDC IDC SET LEADCHNL LV PACING AMPLITUDE: 2 V
MDC IDC SET LEADCHNL RA PACING AMPLITUDE: 2.3 V
MDC IDC SET LEADCHNL RV PACING AMPLITUDE: 2.4 V
MDC IDC SET LEADCHNL RV PACING PULSEWIDTH: 0.4 ms
MDC IDC STAT BRADY RV PERCENT PACED: 97 %
Pulse Gen Serial Number: 490477

## 2016-08-10 NOTE — Progress Notes (Signed)
Remote ICD transmission.   

## 2016-08-15 ENCOUNTER — Ambulatory Visit (INDEPENDENT_AMBULATORY_CARE_PROVIDER_SITE_OTHER): Payer: Medicare HMO

## 2016-08-15 DIAGNOSIS — K55069 Acute infarction of intestine, part and extent unspecified: Secondary | ICD-10-CM

## 2016-08-15 LAB — POCT INR: INR: 3.2

## 2016-08-16 ENCOUNTER — Encounter: Payer: Self-pay | Admitting: Cardiology

## 2016-08-19 ENCOUNTER — Telehealth: Payer: Self-pay

## 2016-08-19 ENCOUNTER — Ambulatory Visit (INDEPENDENT_AMBULATORY_CARE_PROVIDER_SITE_OTHER): Payer: Medicare HMO

## 2016-08-19 DIAGNOSIS — Z95810 Presence of automatic (implantable) cardiac defibrillator: Secondary | ICD-10-CM

## 2016-08-19 DIAGNOSIS — I5022 Chronic systolic (congestive) heart failure: Secondary | ICD-10-CM | POA: Diagnosis not present

## 2016-08-19 NOTE — Telephone Encounter (Signed)
Remote ICM transmission received.  Attempted patient call and no answer 

## 2016-08-19 NOTE — Progress Notes (Signed)
EPIC Encounter for ICM Monitoring  Patient Name: [name] is a 83 y.o. male Date: 08/19/2016 Primary Care Physican: [name], MD Primary Cardiologist:Nelson Electrophysiologist: Faustino Congress Weight:Last known weight 197 lbs Right Ventricular Pacing 97% and Left Ventricular Pacing 98%       Attempted call to patient and unable to reach.   Transmission reviewed.    Prescribed dosage: Furosemide 40 mg 1 tablet twice a day. Potassium 20 mEq 1 tablet daily  Recommendations: NONE - Unable to reach patient   Follow-up plan: ICM clinic phone appointment on 09/26/2016.    Copy of ICM check sent to device physician.   Implanted Device Measures                  Most Recent Daily Measurement: 08/15/2016  Respiratory Rate:     20 rpm Activity Level:     2.7 hour(s) Total Time in AT/AF:    0.0 hour(s)   8 Day Trend Data  6 Month Trend

## 2016-08-29 DIAGNOSIS — N5201 Erectile dysfunction due to arterial insufficiency: Secondary | ICD-10-CM | POA: Diagnosis not present

## 2016-08-29 DIAGNOSIS — N3020 Other chronic cystitis without hematuria: Secondary | ICD-10-CM | POA: Diagnosis not present

## 2016-08-29 DIAGNOSIS — N400 Enlarged prostate without lower urinary tract symptoms: Secondary | ICD-10-CM | POA: Diagnosis not present

## 2016-08-29 DIAGNOSIS — L308 Other specified dermatitis: Secondary | ICD-10-CM | POA: Diagnosis not present

## 2016-08-29 DIAGNOSIS — L570 Actinic keratosis: Secondary | ICD-10-CM | POA: Diagnosis not present

## 2016-08-29 DIAGNOSIS — Z85828 Personal history of other malignant neoplasm of skin: Secondary | ICD-10-CM | POA: Diagnosis not present

## 2016-08-29 DIAGNOSIS — E291 Testicular hypofunction: Secondary | ICD-10-CM | POA: Diagnosis not present

## 2016-08-29 DIAGNOSIS — N318 Other neuromuscular dysfunction of bladder: Secondary | ICD-10-CM | POA: Diagnosis not present

## 2016-08-30 ENCOUNTER — Encounter: Payer: Self-pay | Admitting: Cardiology

## 2016-09-03 ENCOUNTER — Emergency Department (HOSPITAL_COMMUNITY)
Admission: EM | Admit: 2016-09-03 | Discharge: 2016-09-03 | Disposition: A | Discharge status: Home / Self Care | Payer: Medicare HMO | Attending: Emergency Medicine | Admitting: Emergency Medicine

## 2016-09-03 ENCOUNTER — Encounter (HOSPITAL_COMMUNITY): Payer: Self-pay | Admitting: Emergency Medicine

## 2016-09-03 ENCOUNTER — Emergency Department (HOSPITAL_COMMUNITY): Payer: Medicare HMO

## 2016-09-03 DIAGNOSIS — Z79899 Other long term (current) drug therapy: Secondary | ICD-10-CM | POA: Diagnosis not present

## 2016-09-03 DIAGNOSIS — N3000 Acute cystitis without hematuria: Secondary | ICD-10-CM

## 2016-09-03 DIAGNOSIS — I2510 Atherosclerotic heart disease of native coronary artery without angina pectoris: Secondary | ICD-10-CM | POA: Insufficient documentation

## 2016-09-03 DIAGNOSIS — Z87891 Personal history of nicotine dependence: Secondary | ICD-10-CM | POA: Insufficient documentation

## 2016-09-03 DIAGNOSIS — I10 Essential (primary) hypertension: Secondary | ICD-10-CM | POA: Diagnosis not present

## 2016-09-03 DIAGNOSIS — Z7901 Long term (current) use of anticoagulants: Secondary | ICD-10-CM | POA: Diagnosis not present

## 2016-09-03 DIAGNOSIS — I4891 Unspecified atrial fibrillation: Secondary | ICD-10-CM | POA: Insufficient documentation

## 2016-09-03 DIAGNOSIS — K7460 Unspecified cirrhosis of liver: Secondary | ICD-10-CM | POA: Diagnosis not present

## 2016-09-03 DIAGNOSIS — I509 Heart failure, unspecified: Secondary | ICD-10-CM | POA: Insufficient documentation

## 2016-09-03 DIAGNOSIS — R5081 Fever presenting with conditions classified elsewhere: Secondary | ICD-10-CM

## 2016-09-03 DIAGNOSIS — R509 Fever, unspecified: Secondary | ICD-10-CM | POA: Diagnosis not present

## 2016-09-03 LAB — CBC WITH DIFFERENTIAL/PLATELET
Basophils Absolute: 0 10*3/uL (ref 0.0–0.1)
Basophils Relative: 0 %
Eosinophils Absolute: 0 10*3/uL (ref 0.0–0.7)
Eosinophils Relative: 0 %
HCT: 42.3 % (ref 39.0–52.0)
Hemoglobin: 14.1 g/dL (ref 13.0–17.0)
Lymphocytes Relative: 24 %
Lymphs Abs: 0.9 10*3/uL (ref 0.7–4.0)
MCH: 29.9 pg (ref 26.0–34.0)
MCHC: 33.3 g/dL (ref 30.0–36.0)
MCV: 89.8 fL (ref 78.0–100.0)
Monocytes Absolute: 0.4 10*3/uL (ref 0.1–1.0)
Monocytes Relative: 12 %
Neutro Abs: 2.4 10*3/uL (ref 1.7–7.7)
Neutrophils Relative %: 64 %
Platelets: 54 10*3/uL — ABNORMAL LOW (ref 150–400)
RBC: 4.71 MIL/uL (ref 4.22–5.81)
RDW: 14.4 % (ref 11.5–15.5)
WBC: 3.7 10*3/uL — ABNORMAL LOW (ref 4.0–10.5)

## 2016-09-03 LAB — COMPREHENSIVE METABOLIC PANEL
ALBUMIN: 3.1 g/dL — AB (ref 3.5–5.0)
ALK PHOS: 62 U/L (ref 38–126)
ALT: 22 U/L (ref 17–63)
AST: 31 U/L (ref 15–41)
Anion gap: 7 (ref 5–15)
BILIRUBIN TOTAL: 2 mg/dL — AB (ref 0.3–1.2)
BUN: 16 mg/dL (ref 6–20)
CALCIUM: 8.8 mg/dL — AB (ref 8.9–10.3)
CO2: 30 mmol/L (ref 22–32)
CREATININE: 1.36 mg/dL — AB (ref 0.61–1.24)
Chloride: 96 mmol/L — ABNORMAL LOW (ref 101–111)
GFR calc Af Amer: 54 mL/min — ABNORMAL LOW (ref >60)
GFR calc non Af Amer: 46 mL/min — ABNORMAL LOW (ref >60)
GLUCOSE: 159 mg/dL — AB (ref 65–99)
Potassium: 3.6 mmol/L (ref 3.5–5.1)
Sodium: 133 mmol/L — ABNORMAL LOW (ref 135–145)
TOTAL PROTEIN: 5.5 g/dL — AB (ref 6.5–8.1)

## 2016-09-03 LAB — URINALYSIS, ROUTINE W REFLEX MICROSCOPIC
Bilirubin Urine: NEGATIVE
GLUCOSE, UA: 100 mg/dL — AB
Ketones, ur: NEGATIVE mg/dL
Nitrite: POSITIVE — AB
PH: 5 (ref 5.0–8.0)
Protein, ur: NEGATIVE mg/dL
Specific Gravity, Urine: 1.01 (ref 1.005–1.030)

## 2016-09-03 LAB — URINALYSIS, MICROSCOPIC (REFLEX): RBC / HPF: NONE SEEN RBC/hpf (ref 0–5)

## 2016-09-03 LAB — I-STAT CG4 LACTIC ACID, ED
LACTIC ACID, VENOUS: 1.24 mmol/L (ref 0.5–1.9)
Lactic Acid, Venous: 1.53 mmol/L (ref 0.5–1.9)

## 2016-09-03 MED ORDER — DEXTROSE 5 % IV SOLN
1.0000 g | Freq: Once | INTRAVENOUS | Status: AC
  Administered 2016-09-03: 1 g via INTRAVENOUS
  Filled 2016-09-03: qty 10

## 2016-09-03 MED ORDER — AMOXICILLIN-POT CLAVULANATE 875-125 MG PO TABS: 1.0000 | ORAL_TABLET | Freq: Two times a day (BID) | ORAL | 0 refills | Status: DC

## 2016-09-03 NOTE — ED Provider Notes (Signed)
[facility] DEPT Provider Note   CSN: [id number] Arrival date & time: 09/03/16  1318     History   Chief Complaint Chief Complaint  Patient presents with  . Fever  . Pyelonephritis    HPI [name] is a 83 y.o. male.  HPI Patient reports he was seen by his urologist, Dr. Harrel Lemon, 6 days ago for a routine follow-up. Patient describes history of TURP about a year and a half ago. He denies any more recent history of urological procedures. Incidentally the following day, Tuesday he started developing fevers and back pain. His wife reports she called the urologist as they had a urine specimen and was told of the urine specimen was positive for infection. She reports they called in Levaquin which he then started on Wednesday morning. She reports he has had 4 doses. Patient has however continued to get worse. He's been having left back pain which she initially thought was muscle pull. He has had fevers up to 102. He has been experiencing chills. Past Medical History:  Diagnosis Date  . AAA (abdominal aortic aneurysm) ([place])   . Acute bronchitis 09/02/2013  . Acute vascular insufficiency of intestine (HCC)    Mesenteric embolus...surgery 2003  . Anxiety   . Atrial fibrillation (HCC)    paroxysmal  . Back pain 08/08/2016  . Biventricular implantable cardiac defibrillator in situ    GDT CONTAK H170-MADIT-CRT-EXPLANTED 2011 implanted defibrillator- Guidant cognis, model n119-2001 Dr. Caryl Comes (11/28/2009)  . Breast tenderness    Spironolactone was stopped and eplerinone started. Patient feels better  . CAD (coronary artery disease)    CABG 1994  /   catheterization 1996, occluded vein graft to the diagonal, other grafts patent  /   catheterization 2006, no PCI  /  nuclear, October, 2011, extensive scar anteroseptal and apical, no ischemia  . Carotid artery disease ([place])    Doppler, February,  2012, 0-39% bilateral, recommended followup 1 year  . CHF (congestive heart failure) (HCC)    chronic systolic  . Chronotropic incompetence   . Colon polyp   . Colonic polyp 2010   pathology not clear.   . Crohn's disease ([place])   . Degenerative joint disease   . Dermatitis 04/05/2016  . Dizziness    Positional dizziness  . Edema 09/02/2013  . Edema leg    Venous Dopplers 8/13: No DVT bilaterally  . GERD (gastroesophageal reflux disease)   . Hx of CABG    1994  . Hyperglycemia 08/08/2016  . Hyperlipemia   . Hypertension   . Hypertrophy of prostate with urinary obstruction and other lower urinary tract symptoms (LUTS)   . Iron deficiency anemia   . Ischemic cardiomyopathy    CABG 1994 CAD catherization 1996.[name] occluded vein graft to the diagnol, other grafts patent/  catherization 2006, no PCI/ nuclear.[name] october 2011.[name] extensive scar anteroseptal and apical.. no ischemia    . LBBB (left bundle branch block)   . Mitral regurgitation    mild.[name] echo november 2011  EF 35%...echo.. november 2009/ ef 35%.[name] echo november 2011  . Oral thrush 02/05/2015  . Polymyalgia rheumatica ([place])   . Shingles   . Sinus bradycardia   . Skin cancer 08/08/2016  . Thrombocytopenia ([place]) 2008  . Thrombocytopenia ([place]) 11/30/2011  . Thrush   . Tremor   . Ventral hernia    multiple, wears an abd binder, reports he hs been told he was at increased riisk for surgery  . Vitamin B12 deficiency   . Warfarin  anticoagulation    Coumadin therapy for mesenteric embolus 2003    Patient Active Problem List   Diagnosis Date Noted  . Skin cancer 08/08/2016  . Hyperglycemia 08/08/2016  . Back pain 08/08/2016  . Dermatitis 04/05/2016  . Aortic atherosclerosis ([place]) 11/10/2015  . Cirrhosis, nonalcoholic ([place]) 09/81/1914  . Allergic rhinitis 09/09/2013  . Hypokalemia 06/18/2013  . Hypoalbuminemia 06/16/2013  . Left ankle pain 06/07/2013  . Thrombocytopenia ([place]) 11/30/2011  . Ejection fraction   . Edema leg   . OSA (obstructive sleep apnea) 09/16/2011  . CAD (coronary artery disease)   . Hx of CABG   .  Hypertension   . Chronic systolic heart failure ([place])   . Mitral regurgitation   . Chronotropic incompetence   . Hyperlipemia   . Crohn's disease of large intestine ([place])   . Ventral hernia   . Tremor   . Warfarin anticoagulation   . Carotid artery disease ([place])   . 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 IMPLANTABLE DEFIBRILLATOR, GDT CONTAK H170-MADIT-CRT 12/09/2009  . HYPERTROPHY PROSTATE W/UR OBST & OTH LUTS 03/31/2009  . Vitamin B 12 deficiency 11/06/2008  . Vitamin D deficiency 11/06/2008  . Cardiomyopathy, ischemic 09/15/2008  . LBBB 06/25/2008  . Fatigue 06/25/2008  . COLONIC POLYPS 03/23/2007  . POLYMYALGIA RHEUMATICA 03/23/2007  . GERD 02/21/2007    Past Surgical History:  Procedure Laterality Date  . CATARACT EXTRACTION, BILATERAL  2014  . COLONOSCOPY N/A 04/19/2013   Procedure: COLONOSCOPY;  Surgeon: Jerene Bears, MD;  Location: [facility] ENDOSCOPY;  Service: Endoscopy;  Laterality: N/A;  . CORONARY ARTERY BYPASS GRAFT  1994  . Elap w/ superior mesenteric artery embolectomy  1/03   by Dr. Jennette Banker  . ESOPHAGOGASTRODUODENOSCOPY N/A 04/18/2013   Procedure: ESOPHAGOGASTRODUODENOSCOPY (EGD);  Surgeon: Jerene Bears, MD;  Location: [facility] ENDOSCOPY;  Service: Endoscopy;  Laterality: N/A;  . implantation of biventric cardioverter-defibrillatorr     by Dr. Lovena Le in 8/06 Guidant Contak H170-explanted 2011  . implantation of guidant cognis device     model n119-2011  . laparoscopic cholecystectomy  2002  . left inguinal hernia repair with mesh  6/08   by Dr. Betsy Pries  . PROSTATE SURGERY  08/2012   by urology in [place]  . Repair of incarcerated ventral hernia and lysis of adhesions  11/03   by Dr. Betsy Pries       Home Medications    Prior to Admission medications   Medication Sig Start Date End Date Taking? Authorizing Provider  amoxicillin-clavulanate (AUGMENTIN) 875-125 MG tablet Take 1 tablet by mouth 2 (two) times daily. One po bid x 7 days 09/03/16   Charlesetta Shanks, MD    carvedilol (COREG) 6.25 MG tablet Take 1 tablet (6.25 mg total) by mouth 2 (two) times daily with a meal. 08/08/16   [name], MD  cholecalciferol (VITAMIN D) 1000 UNITS tablet Take 1,000 Units by mouth daily.     [provider]  folic acid (FOLVITE) 1 MG tablet Take 1 tablet by mouth daily 02/28/13   [provider]  furosemide (LASIX) 40 MG tablet Take 1 tablet (40 mg total) by mouth 2 (two) times daily. 05/24/16   Dorothy Spark, MD  KLOR-CON M20 20 MEQ tablet TAKE ONE TABLET BY MOUTH ONCE DAILY 06/30/16   Dorothy Spark, MD  lisinopril (PRINIVIL,ZESTRIL) 10 MG tablet Take 1 tablet (10 mg total) by mouth daily. 05/24/16   Dorothy Spark, MD  pantoprazole (PROTONIX) 40 MG tablet TAKE 1 TABLET BY  MOUTH  EVERY DAY AT 12 NOON 05/24/16   Dorothy Spark, MD  simvastatin (ZOCOR) 20 MG tablet Take 1 tablet (20 mg total) by mouth at bedtime. 08/08/16   [name], MD  tiZANidine (ZANAFLEX) 2 MG tablet Take 1 tablet (2 mg total) by mouth at bedtime as needed for muscle spasms. 08/08/16   [name], MD  vitamin B-12 (CYANOCOBALAMIN) 1000 MCG tablet Take 1,000 mcg by mouth every other day.    [provider]  warfarin (COUMADIN) 5 MG tablet Take 1 tablet (5 mg total) by mouth daily. 05/17/16   Dorothy Spark, MD    Family History Family History  Problem Relation Age of Onset  . Heart disease Mother   . Tremor Sister   . Heart failure Sister   . Heart disease Father 50  . Hypertension Father   . Appendicitis Brother        ruptured  . Diabetes Son   . Heart disease Sister   . Hypertension Sister   . Dementia Sister   . Heart disease Sister   . Hypertension Brother   . Kidney disease Brother   . Myasthenia gravis Brother   . Cirrhosis Brother   . Leukemia Brother   . Hypertension Unknown   . Hyperlipidemia Unknown   . Heart disease Sister   . Edema Sister        pedal edema    Social History Social History  Substance Use Topics   . Smoking status: Former Smoker    Packs/day: 1.00    Years: 20.00    Types: Cigarettes    Quit date: 01/31/1973  . Smokeless tobacco: Never Used  . Alcohol use 0.0 oz/week     Comment: occasional     Allergies   Patient has no known allergies.   Review of Systems Review of Systems 10 Systems reviewed and are negative for acute change except as noted in the HPI.   Physical Exam Updated Vital Signs BP 117/75   Pulse (!) 52   Temp 98.3 F (36.8 C) (Oral)   Resp 16   Ht 5' 11"  (1.803 m)   Wt 83.9 kg (185 lb)   SpO2 92%   BMI 25.80 kg/m   Physical Exam  Constitutional: He is oriented to person, place, and time. He appears well-developed and well-nourished.  HENT:  Head: Normocephalic and atraumatic.  Eyes: Conjunctivae and EOM are normal.  Neck: Neck supple.  Cardiovascular: Normal rate and regular rhythm.   No murmur heard. Pulmonary/Chest: Effort normal and breath sounds normal. No respiratory distress.  Abdominal: Soft. There is no tenderness.  Multiple anterior abdominal wall hernias soft and nontender. No CVA tenderness.  Musculoskeletal: He exhibits no edema or tenderness.  Neurological: He is alert and oriented to person, place, and time. No cranial nerve deficit. He exhibits normal muscle tone. Coordination normal.  Skin: Skin is warm and dry.  Psychiatric: He has a normal mood and affect.  Nursing note and vitals reviewed.    ED Treatments / Results  Labs (all labs ordered are listed, but only abnormal results are displayed) Labs Reviewed  COMPREHENSIVE METABOLIC PANEL - Abnormal; Notable for the following:       Result Value   Sodium 133 (*)    Chloride 96 (*)    Glucose, Bld 159 (*)    Creatinine, Ser 1.36 (*)    Calcium 8.8 (*)    Total Protein 5.5 (*)    Albumin 3.1 (*)  Total Bilirubin 2.0 (*)    GFR calc non Af Amer 46 (*)    GFR calc Af Amer 54 (*)    All other components within normal limits  CBC WITH DIFFERENTIAL/PLATELET -  Abnormal; Notable for the following:    WBC 3.7 (*)    Platelets 54 (*)    All other components within normal limits  URINALYSIS, ROUTINE W REFLEX MICROSCOPIC - Abnormal; Notable for the following:    Color, Urine ORANGE (*)    APPearance CLOUDY (*)    Glucose, UA 100 (*)    Hgb urine dipstick TRACE (*)    Nitrite POSITIVE (*)    Leukocytes, UA LARGE (*)    All other components within normal limits  URINALYSIS, MICROSCOPIC (REFLEX) - Abnormal; Notable for the following:    Bacteria, UA MANY (*)    Squamous Epithelial / LPF 0-5 (*)    All other components within normal limits  CULTURE, BLOOD (ROUTINE X 2)  CULTURE, BLOOD (ROUTINE X 2)  URINE CULTURE  I-STAT CG4 LACTIC ACID, ED  I-STAT CG4 LACTIC ACID, ED  I-STAT CG4 LACTIC ACID, ED    EKG  EKG Interpretation None       Radiology Ct Renal Stone Study  Result Date: 09/03/2016 CLINICAL DATA:  Pt seen at doctors and started on antibiotics for possibly kidney infection on Tuesday. Pt with intermittent fevers, tmax 102.9 which generally start in the evenings along with chills. NAD at this time. Patient also describes left-sided flank pain. Patient describes urine as thick and foul-smelling. EXAM: CT ABDOMEN AND PELVIS WITHOUT CONTRAST TECHNIQUE: Multidetector CT imaging of the abdomen and pelvis was performed following the standard protocol without IV contrast. COMPARISON:  CT abdomen dated 06/02/2016. FINDINGS: Lower chest: No acute abnormality. Cardiomegaly. Coronary artery calcifications. Paraesophageal varices within the lower mediastinum. Hepatobiliary: Cirrhotic appearing liver. No focal liver abnormality is seen. Status post cholecystectomy. No biliary dilatation. Pancreas: Partially infiltrated with fat but otherwise unremarkable. Spleen: Enlarged. Adrenals/Urinary Tract: Adrenal glands appear normal. 2 mm nonobstructing right renal stone. No hydronephrosis. Left kidney is unremarkable without mass, stone or hydronephrosis. No  perinephric fluid. No ureteral or bladder calculi identified. Bladder is unremarkable. Stomach/Bowel: Bowel is normal in caliber. No bowel wall thickening or evidence of bowel wall inflammation. Ventral abdominal wall hernias appears stable, without evidence of associated obstruction or inflammation. Vascular/Lymphatic: Aortic atherosclerosis.  Perigastric varices. No enlarged lymph nodes seen in the abdomen or pelvis. Reproductive: Prostate is unremarkable. Other: No free fluid or abscess collection. No free intraperitoneal air. Musculoskeletal: Degenerative changes throughout the thoracolumbar spine, mild to moderate in degree. No acute or suspicious osseous finding. IMPRESSION: 1. No source for acute flank pain or fever identified. 2 mm nonobstructing right renal stone. No hydronephrosis bilaterally. No ureteral or bladder calculi identified. No perinephric fluid or other secondary signs of pyelonephritis. Bladder is unremarkable, without obvious wall thickening. 2. Cirrhotic liver with associated splenomegaly and upper abdominal/paraesophageal varices. 3. Multiple ventral abdominal wall hernias which contain loops of large and small bowel. No evidence of associated bowel obstruction or bowel wall inflammation. Overall, no bowel obstruction or evidence of bowel wall inflammation seen. 4. Cardiomegaly and coronary artery calcifications. 5. Aortic atherosclerosis. Electronically Signed   By: Franki Cabot M.D.   On: 09/03/2016 19:29    Procedures Procedures (including critical care time)  Medications Ordered in ED Medications  cefTRIAXone (ROCEPHIN) 1 g in dextrose 5 % 50 mL IVPB (0 g Intravenous Stopped 09/03/16 1821)  Initial Impression / Assessment and Plan / ED Course  I have reviewed the triage vital signs and the nursing notes.  Pertinent labs & imaging results that were available during my care of the patient were reviewed by me and considered in my medical decision making (see chart for  details).      Final Clinical Impressions(s) / ED Diagnoses   Final diagnoses:  Fever in other diseases  Acute cystitis without hematuria   Patient has been on Levaquin but continues to have fever. Urinalysis remains grossly positive. Patient however is nontoxic. He does not have flank tenderness. No leukocytosis, no lactic acidosis. CT scan obtained to rule out pyelonephritis or retained kidney stone. None present. At this time, with patient being nontoxic and diagnostic studies within normal limits, plan will be to expand coverage of UTI. Patient has been given a dose of Rocephin IV in the emergency department. Augmentin will be added and patient is instructed to complete his Levaquin. Patient is to call his urologist for recheck Monday or Tuesday. Strict return precautions are reviewed with patient and family members regarding any decline in general condition and return to hospital. New Prescriptions New Prescriptions   AMOXICILLIN-CLAVULANATE (AUGMENTIN) 875-125 MG TABLET    Take 1 tablet by mouth 2 (two) times daily. One po bid x 7 days     Charlesetta Shanks, MD 09/03/16 2021

## 2016-09-03 NOTE — ED Notes (Signed)
ED provider at bedside.

## 2016-09-03 NOTE — ED Provider Notes (Signed)
To room, patient is not yet in room. Have reviewed the EMR. Awaiting patient to be roomed.   Charlesetta Shanks, MD 09/03/16 1630

## 2016-09-03 NOTE — ED Triage Notes (Signed)
Pt seen at doctors and started on antibiotics for possibly kidney infection on Tuesday. Pt with intermittent fevers, tmax 102.9 which generally start in the evenings along with chills. NAD at this time. Afebrile in triage. Pt also endorses lower L side flank pain. Pt reports urine has been thick and foul smelling.

## 2016-09-03 NOTE — ED Notes (Signed)
Patient ambulatory to the room, no distress at this time.  Patient changing into gown.

## 2016-09-03 NOTE — Discharge Instructions (Signed)
1. Continue taking her Levaquin as prescribed. 2. Take Augmentin as prescribed. 3. Call your urologist on Monday to be rechecked Monday or Tuesday. 4. Return immediately to the emergency department if you are feeling generally ill, getting worse or have other concerns. 5. Blood cultures and urine cultures have been done in the emergency department. If these are positive at 24 hours, you'll be called to return to the emergency department. Please also notify your doctor to follow-up on these results.

## 2016-09-03 NOTE — ED Notes (Signed)
Patient taken to CT.

## 2016-09-05 ENCOUNTER — Ambulatory Visit (INDEPENDENT_AMBULATORY_CARE_PROVIDER_SITE_OTHER): Payer: Medicare HMO

## 2016-09-05 DIAGNOSIS — K55069 Acute infarction of intestine, part and extent unspecified: Secondary | ICD-10-CM | POA: Diagnosis not present

## 2016-09-05 LAB — POCT INR: INR: 3.9

## 2016-09-06 LAB — URINE CULTURE

## 2016-09-07 ENCOUNTER — Telehealth: Payer: Self-pay | Admitting: Emergency Medicine

## 2016-09-07 NOTE — Telephone Encounter (Signed)
Post ED Visit - Positive Culture Follow-up  Culture report reviewed by antimicrobial stewardship pharmacist:  []  Elenor Quinones, Pharm.D. []  Heide Guile, Pharm.D., BCPS AQ-ID []  Parks Neptune, Pharm.D., BCPS []  Alycia Rossetti, Pharm.D., BCPS []  [place], Pharm.D., BCPS, AAHIVP []  Legrand Como, Pharm.D., BCPS, AAHIVP []  Salome Arnt, PharmD, BCPS []  Dimitri Ped, PharmD, BCPS []  Vincenza Hews, PharmD, BCPS Nida Boatman PharmD  Positive urine culture Treated with augmentin DS, organism sensitive to the same and no further patient follow-up is required at this time.  Hazle Nordmann 09/07/2016, 11:42 AM

## 2016-09-08 LAB — CULTURE, BLOOD (ROUTINE X 2)
CULTURE: NO GROWTH
CULTURE: NO GROWTH
Special Requests: ADEQUATE
Special Requests: ADEQUATE

## 2016-09-13 ENCOUNTER — Ambulatory Visit: Payer: Medicare HMO | Admitting: Family Medicine

## 2016-09-15 ENCOUNTER — Other Ambulatory Visit: Payer: Self-pay

## 2016-09-15 ENCOUNTER — Telehealth: Payer: Self-pay

## 2016-09-15 DIAGNOSIS — K7460 Unspecified cirrhosis of liver: Secondary | ICD-10-CM

## 2016-09-15 NOTE — Telephone Encounter (Signed)
-----   Message from Algernon Huxley, RN sent at 03/23/2016 11:10 AM EST ----- Regarding: [place] ABD Needs 6 mth follow-up US

## 2016-09-15 NOTE — Telephone Encounter (Signed)
Pt scheduled for [place] of abd at [facility] 09/22/16 at 10:30am, pt to arrive there at 10:15am. Pt to be NPO after midnight. Pt aware of appt.

## 2016-09-19 ENCOUNTER — Ambulatory Visit (INDEPENDENT_AMBULATORY_CARE_PROVIDER_SITE_OTHER): Payer: Medicare HMO | Admitting: *Deleted

## 2016-09-19 DIAGNOSIS — K55069 Acute infarction of intestine, part and extent unspecified: Secondary | ICD-10-CM | POA: Diagnosis not present

## 2016-09-19 LAB — POCT INR: INR: 4.2

## 2016-09-22 ENCOUNTER — Ambulatory Visit (HOSPITAL_COMMUNITY)
Admission: RE | Admit: 2016-09-22 | Discharge: 2016-09-22 | Disposition: A | Discharge status: Home / Self Care | Payer: Medicare HMO | Source: Ambulatory Visit | Attending: Internal Medicine | Admitting: Internal Medicine

## 2016-09-22 DIAGNOSIS — R932 Abnormal findings on diagnostic imaging of liver and biliary tract: Secondary | ICD-10-CM | POA: Diagnosis not present

## 2016-09-22 DIAGNOSIS — R161 Splenomegaly, not elsewhere classified: Secondary | ICD-10-CM | POA: Diagnosis not present

## 2016-09-22 DIAGNOSIS — K7460 Unspecified cirrhosis of liver: Secondary | ICD-10-CM | POA: Diagnosis not present

## 2016-09-26 ENCOUNTER — Ambulatory Visit (INDEPENDENT_AMBULATORY_CARE_PROVIDER_SITE_OTHER): Payer: Medicare HMO

## 2016-09-26 DIAGNOSIS — I5022 Chronic systolic (congestive) heart failure: Secondary | ICD-10-CM | POA: Diagnosis not present

## 2016-09-26 DIAGNOSIS — Z95810 Presence of automatic (implantable) cardiac defibrillator: Secondary | ICD-10-CM | POA: Diagnosis not present

## 2016-09-27 NOTE — Progress Notes (Signed)
EPIC Encounter for ICM Monitoring  Patient Name: [name] is a 83 y.o. male Date: 09/27/2016 Primary Care Physican: [name], MD Primary Cardiologist:[place] Electrophysiologist: Faustino Congress Weight:Last known weight 197 lbs Right Ventricular Pacing 97% and Left Ventricular Pacing 98%        Spoke with wife. Heart Failure questions reviewed, pt asymptomatic.     Prescribed dosage: Furosemide 40 mg 1 tablet twice a day. Potassium 20 mEq 1 tablet daily  Recommendations: No changes.   Encouraged to call for fluid symptoms.  Follow-up plan: ICM clinic phone appointment on 11/09/2016.  Office appointment scheduled 10//29/2018 with Dr. Caryl Comes.  Copy of ICM check sent to Dr Caryl Comes.    8 Day Trend Data  6 Month Trend

## 2016-10-04 ENCOUNTER — Ambulatory Visit (INDEPENDENT_AMBULATORY_CARE_PROVIDER_SITE_OTHER): Payer: Medicare HMO | Admitting: *Deleted

## 2016-10-04 DIAGNOSIS — Z5181 Encounter for therapeutic drug level monitoring: Secondary | ICD-10-CM | POA: Diagnosis not present

## 2016-10-04 DIAGNOSIS — K55069 Acute infarction of intestine, part and extent unspecified: Secondary | ICD-10-CM

## 2016-10-04 LAB — POCT INR: INR: 2.9

## 2016-10-17 ENCOUNTER — Ambulatory Visit (INDEPENDENT_AMBULATORY_CARE_PROVIDER_SITE_OTHER): Payer: Medicare HMO

## 2016-10-17 DIAGNOSIS — Z0000 Encounter for general adult medical examination without abnormal findings: Secondary | ICD-10-CM | POA: Insufficient documentation

## 2016-10-17 DIAGNOSIS — Z5181 Encounter for therapeutic drug level monitoring: Secondary | ICD-10-CM

## 2016-10-17 DIAGNOSIS — K55069 Acute infarction of intestine, part and extent unspecified: Secondary | ICD-10-CM

## 2016-10-17 LAB — POCT INR: INR: 3.9

## 2016-11-01 ENCOUNTER — Ambulatory Visit (INDEPENDENT_AMBULATORY_CARE_PROVIDER_SITE_OTHER): Payer: Medicare HMO | Admitting: *Deleted

## 2016-11-01 DIAGNOSIS — Z5181 Encounter for therapeutic drug level monitoring: Secondary | ICD-10-CM | POA: Diagnosis not present

## 2016-11-01 DIAGNOSIS — K55069 Acute infarction of intestine, part and extent unspecified: Secondary | ICD-10-CM

## 2016-11-01 LAB — POCT INR: INR: 1.5

## 2016-11-03 ENCOUNTER — Other Ambulatory Visit: Payer: Self-pay | Admitting: Cardiology

## 2016-11-09 ENCOUNTER — Ambulatory Visit (INDEPENDENT_AMBULATORY_CARE_PROVIDER_SITE_OTHER): Payer: Medicare HMO | Admitting: *Deleted

## 2016-11-09 DIAGNOSIS — I255 Ischemic cardiomyopathy: Secondary | ICD-10-CM | POA: Diagnosis not present

## 2016-11-09 NOTE — Progress Notes (Signed)
Remote ICD transmission.   

## 2016-11-11 ENCOUNTER — Encounter: Payer: Self-pay | Admitting: Cardiology

## 2016-11-15 ENCOUNTER — Ambulatory Visit (INDEPENDENT_AMBULATORY_CARE_PROVIDER_SITE_OTHER): Payer: Medicare HMO

## 2016-11-15 DIAGNOSIS — K55069 Acute infarction of intestine, part and extent unspecified: Secondary | ICD-10-CM

## 2016-11-15 DIAGNOSIS — Z5181 Encounter for therapeutic drug level monitoring: Secondary | ICD-10-CM

## 2016-11-15 LAB — POCT INR: INR: 1.9

## 2016-11-17 ENCOUNTER — Ambulatory Visit (INDEPENDENT_AMBULATORY_CARE_PROVIDER_SITE_OTHER): Payer: Medicare HMO

## 2016-11-17 ENCOUNTER — Telehealth: Payer: Self-pay

## 2016-11-17 DIAGNOSIS — Z95810 Presence of automatic (implantable) cardiac defibrillator: Secondary | ICD-10-CM

## 2016-11-17 DIAGNOSIS — I5022 Chronic systolic (congestive) heart failure: Secondary | ICD-10-CM | POA: Diagnosis not present

## 2016-11-17 NOTE — Telephone Encounter (Signed)
Remote ICM transmission received.  Attempted call to patient and no answer

## 2016-11-17 NOTE — Progress Notes (Signed)
EPIC Encounter for ICM Monitoring  Patient Name: [name] is a 83 y.o. male Date: 11/17/2016 Primary Care Physican: [name], MD Primary Cardiologist:Nelson Electrophysiologist: Faustino Congress Weight:Last known weight 197 lbs Right Ventricular Pacing 97% and Left Ventricular Pacing 99%       Attempted call to patient and unable to reach.    Transmission reviewed.       Prescribed dosage: Furosemide 40 mg 1 tablet twice a day. Potassium 20 mEq 1 tablet daily  Recommendations:  NONE - Unable to reach patient   Follow-up plan: ICM clinic phone appointment on 11/292018.  Office appointment scheduled 11/28/2016 with Dr. Caryl Comes.  Copy of ICM check sent to Dr Caryl Comes.     8 Day Trend Data  6 Month Trend

## 2016-11-21 LAB — CUP PACEART REMOTE DEVICE CHECK
Brady Statistic RV Percent Paced: 97 %
Date Time Interrogation Session: 20181010055400
HIGH POWER IMPEDANCE MEASURED VALUE: 56 Ohm
Implantable Lead Implant Date: 20060808
Implantable Lead Implant Date: 20060911
Implantable Lead Location: 753858
Implantable Lead Location: 753860
Implantable Lead Model: 4087
Implantable Lead Serial Number: 165392
Implantable Lead Serial Number: 261368
Implantable Pulse Generator Implant Date: 20111028
Lead Channel Impedance Value: 409 Ohm
Lead Channel Impedance Value: 606 Ohm
Lead Channel Pacing Threshold Amplitude: 1 V
Lead Channel Pacing Threshold Amplitude: 1.1 V
Lead Channel Setting Pacing Amplitude: 2.4 V
Lead Channel Setting Pacing Pulse Width: 0.4 ms
Lead Channel Setting Pacing Pulse Width: 0.8 ms
Lead Channel Setting Sensing Sensitivity: 0.6 mV
MDC IDC LEAD IMPLANT DT: 20060808
MDC IDC LEAD LOCATION: 753859
MDC IDC MSMT BATTERY REMAINING LONGEVITY: 36 mo
MDC IDC MSMT BATTERY REMAINING PERCENTAGE: 55 %
MDC IDC MSMT LEADCHNL LV PACING THRESHOLD AMPLITUDE: 0.7 V
MDC IDC MSMT LEADCHNL LV PACING THRESHOLD PULSEWIDTH: 0.8 ms
MDC IDC MSMT LEADCHNL RA IMPEDANCE VALUE: 462 Ohm
MDC IDC MSMT LEADCHNL RA PACING THRESHOLD PULSEWIDTH: 0.4 ms
MDC IDC MSMT LEADCHNL RV PACING THRESHOLD PULSEWIDTH: 0.4 ms
MDC IDC SET LEADCHNL LV PACING AMPLITUDE: 2 V
MDC IDC SET LEADCHNL LV SENSING SENSITIVITY: 1 mV
MDC IDC SET LEADCHNL RA PACING AMPLITUDE: 2.3 V
MDC IDC STAT BRADY RA PERCENT PACED: 81 %
Pulse Gen Serial Number: 490477

## 2016-11-23 ENCOUNTER — Encounter: Payer: Self-pay | Admitting: Cardiology

## 2016-11-23 ENCOUNTER — Ambulatory Visit (INDEPENDENT_AMBULATORY_CARE_PROVIDER_SITE_OTHER): Payer: Medicare HMO | Admitting: Cardiology

## 2016-11-23 VITALS — BP 120/68 | HR 61 | Ht 73.0 in | Wt 195.6 lb

## 2016-11-23 DIAGNOSIS — I5022 Chronic systolic (congestive) heart failure: Secondary | ICD-10-CM | POA: Diagnosis not present

## 2016-11-23 DIAGNOSIS — I2510 Atherosclerotic heart disease of native coronary artery without angina pectoris: Secondary | ICD-10-CM

## 2016-11-23 DIAGNOSIS — I255 Ischemic cardiomyopathy: Secondary | ICD-10-CM | POA: Diagnosis not present

## 2016-11-23 NOTE — Patient Instructions (Signed)
Medication Instructions: Your physician recommends that you continue on your current medications as directed. Please refer to the Current Medication list given to you today.  Labwork: None Ordered  Procedures/Testing: None Ordered  Follow-Up: Your physician wants you to follow-up in: 6 MONTHS with Dr. Meda Coffee. You will receive a reminder letter in the mail two months in advance. If you don't receive a letter, please call our office to schedule the follow-up appointment.   If you need a refill on your cardiac medications before your next appointment, please call your pharmacy.

## 2016-11-23 NOTE — Progress Notes (Signed)
11/23/2016 [name]   1931-11-20  [id number]  Primary Physician [name] Lukes, MD Primary Cardiologist: Dr. Meda [name]  Electrophysiologist: Dr. Caryl [name]  Reason for Visit/CC: routine f/u Ischemic heart disease/ HF  HPI:  [name] is a 83 y.o. male who is being seen today for routine f/u. He is a former pt of Dr. Ron [name]. Now followed by Dr. Meda [name]. Dr. Caryl [name] follows him in device clinic. He has a h/o ischemic heart disease, CAD s/p CABG. His last LHC in 2006 showed an occluded SVG to diag but all other grafts were patent. NST in 2011 showed scar but no ischemia. He has chronic systolic HF/ ICM w/ EF of 35%. He has a BiV ICD and denies any shocks. Also h/o PAF on chronic anticoagulation with coumadin, AAA followed by vascular surgery, HTN and HLD.   He is here today for routine f/u. He lives with his wife. They just celebrated their 60th wedding anniversary.  He reports that he has done well since his last OV. He denies any cardiac symptoms. He is still active and tends to a farm. He denies exertional CP or dyspnea. No dizziness, syncope/ near syncope. He also denies LEE, orthopnea and PND. No ICD shocks. His AAA is followed by Dr. Donzetta [name]. Last assessment was by CT scan 05/2016 and showed stable 3.4 cm infrarenal AAA. He denies tobacco use. No abdominal or back pain. BP is well controlled. He has had lab work recently. FLP in July showed controlled LDL at 58 mg/DL. Renal, hepatic function and K also stable. He reports full med compliance. His INR is followed in our coumadin clinic. He is scheduled for an INR check next week. He denies abnormal bleeding and no falls.     Current Meds  Medication Sig  . amoxicillin-clavulanate (AUGMENTIN) 875-125 MG tablet Take 1 tablet by mouth 2 (two) times daily. One po bid x 7 days  . carvedilol (COREG) 6.25 MG tablet Take 1 tablet (6.25 mg total) by mouth 2 (two) times daily with a meal.  . cholecalciferol (VITAMIN D) 1000 UNITS tablet Take 1,000 Units by  mouth daily.   . folic acid (FOLVITE) 1 MG tablet Take 1 tablet by mouth daily  . furosemide (LASIX) 40 MG tablet Take 1 tablet (40 mg total) by mouth 2 (two) times daily.  [name] KLOR-CON M20 20 MEQ tablet TAKE ONE TABLET BY MOUTH ONCE DAILY  . lisinopril (PRINIVIL,ZESTRIL) 10 MG tablet Take 1 tablet (10 mg total) by mouth daily.  . pantoprazole (PROTONIX) 40 MG tablet TAKE 1 TABLET BY MOUTH  EVERY DAY AT 12 NOON  . simvastatin (ZOCOR) 20 MG tablet Take 1 tablet (20 mg total) by mouth at bedtime.  [name] tiZANidine (ZANAFLEX) 2 MG tablet Take 1 tablet (2 mg total) by mouth at bedtime as needed for muscle spasms.  . vitamin B-12 (CYANOCOBALAMIN) 1000 MCG tablet Take 1,000 mcg by mouth every other day.  . warfarin (COUMADIN) 5 MG tablet Take as directed by coumadin clinic   No Known Allergies Past Medical History:  Diagnosis Date  . AAA (abdominal aortic aneurysm) ([place])   . Acute bronchitis 09/02/2013  . Acute vascular insufficiency of intestine (HCC)    Mesenteric embolus...surgery 2003  . Anxiety   . Atrial fibrillation (HCC)    paroxysmal  . Back pain 08/08/2016  . Biventricular implantable cardiac defibrillator in situ    GDT CONTAK H170-MADIT-CRT-EXPLANTED 2011 implanted defibrillator- Guidant cognis, model n119-2001 Dr. Caryl [name] (11/28/2009)  . Breast tenderness  Spironolactone was stopped and eplerinone started. Patient feels better  . CAD (coronary artery disease)    CABG 1994  /   catheterization 1996, occluded vein graft to the diagonal, other grafts patent  /   catheterization 2006, no PCI  /  nuclear, October, 2011, extensive scar anteroseptal and apical, no ischemia  . Carotid artery disease ([place])    Doppler, February,  2012, 0-39% bilateral, recommended followup 1 year  . CHF (congestive heart failure) (HCC)    chronic systolic  . Chronotropic incompetence   . Colon polyp   . Colonic polyp 2010   pathology not clear.   . Crohn's disease ([place])   . Degenerative joint disease   .  Dermatitis 04/05/2016  . Dizziness    Positional dizziness  . Edema 09/02/2013  . Edema leg    Venous Dopplers 8/13: No DVT bilaterally  . GERD (gastroesophageal reflux disease)   . Hx of CABG    1994  . Hyperglycemia 08/08/2016  . Hyperlipemia   . Hypertension   . Hypertrophy of prostate with urinary obstruction and other lower urinary tract symptoms (LUTS)   . Iron deficiency anemia   . Ischemic cardiomyopathy    CABG 1994 CAD catherization 1996.[name] occluded vein graft to the diagnol, other grafts patent/  catherization 2006, no PCI/ nuclear.[name] october 2011.[name] extensive scar anteroseptal and apical.. no ischemia    . LBBB (left bundle branch block)   . Mitral regurgitation    mild.[name] echo november 2011  EF 35%...echo.. november 2009/ ef 35%.[name] echo november 2011  . Oral thrush 02/05/2015  . Polymyalgia rheumatica ([place])   . Shingles   . Sinus bradycardia   . Skin cancer 08/08/2016  . Thrombocytopenia ([place]) 2008  . Thrombocytopenia ([place]) 11/30/2011  . Thrush   . Tremor   . Ventral hernia    multiple, wears an abd binder, reports he hs been told he was at increased riisk for surgery  . Vitamin B12 deficiency   . Warfarin anticoagulation    Coumadin therapy for mesenteric embolus 2003   Family History  Problem Relation Age of Onset  . Heart disease Mother   . Tremor Sister   . Heart failure Sister   . Heart disease Father 37  . Hypertension Father   . Appendicitis Brother        ruptured  . Diabetes Son   . Heart disease Sister   . Hypertension Sister   . Dementia Sister   . Heart disease Sister   . Hypertension Brother   . Kidney disease Brother   . Myasthenia gravis Brother   . Cirrhosis Brother   . Leukemia Brother   . Hypertension Unknown   . Hyperlipidemia Unknown   . Heart disease Sister   . Edema Sister        pedal edema   Past Surgical History:  Procedure Laterality Date  . CATARACT EXTRACTION, BILATERAL  2014  . COLONOSCOPY N/A 04/19/2013   Procedure:  COLONOSCOPY;  Surgeon: Jerene Bears, MD;  Location: [facility] ENDOSCOPY;  Service: Endoscopy;  Laterality: N/A;  . CORONARY ARTERY BYPASS GRAFT  1994  . Elap w/ superior mesenteric artery embolectomy  1/03   by Dr. Jennette Banker  . ESOPHAGOGASTRODUODENOSCOPY N/A 04/18/2013   Procedure: ESOPHAGOGASTRODUODENOSCOPY (EGD);  Surgeon: Jerene Bears, MD;  Location: [facility] ENDOSCOPY;  Service: Endoscopy;  Laterality: N/A;  . implantation of biventric cardioverter-defibrillatorr     by Dr. Lovena Le in 8/06 Guidant Contak H170-explanted 2011  . implantation of  guidant cognis device     model [id number]  . laparoscopic cholecystectomy  2002  . left inguinal hernia repair with mesh  6/08   by Dr. Betsy Pries  . PROSTATE SURGERY  08/2012   by urology in [place]  . Repair of incarcerated ventral hernia and lysis of adhesions  11/03   by Dr. Betsy Pries   Social History   Social History  . Marital status: Married    Spouse name: Kennyth Lose  . Number of children: 3  . Years of education: N/A   Occupational History  . bull dozer driver   . farmer Retired  . logger    Social History Main Topics  . Smoking status: Former Smoker    Packs/day: 1.00    Years: 20.00    Types: Cigarettes    Quit date: 01/31/1973  . Smokeless tobacco: Never Used  . Alcohol use 0.0 oz/week     Comment: occasional  . Drug use: No  . Sexual activity: No     Comment: lives with wife, no dietary restrictions   Other Topics Concern  . Not on file   Social History Narrative   Lives in [place], [place] with wife.      Review of Systems: General: negative for chills, fever, night sweats or weight changes.  Cardiovascular: negative for chest pain, dyspnea on exertion, edema, orthopnea, palpitations, paroxysmal nocturnal dyspnea or shortness of breath Dermatological: negative for rash Respiratory: negative for cough or wheezing Urologic: negative for hematuria Abdominal: negative for nausea, vomiting, diarrhea, bright red blood per rectum, melena, or  hematemesis Neurologic: negative for visual changes, syncope, or dizziness All other systems reviewed and are otherwise negative except as noted above.   Physical Exam:  Blood pressure 120/68, pulse 61, height 6' 1"  (1.854 m), weight 195 lb 9.6 oz (88.7 kg), SpO2 96 %.  General appearance: alert, cooperative and no distress Neck: no carotid bruit and no JVD Lungs: clear to auscultation bilaterally Heart: regular rate and rhythm, S1, S2 normal, no murmur, click, rub or gallop Extremities: extremities normal, atraumatic, no cyanosis or edema Pulses: 2+ and symmetric Skin: Skin color, texture, turgor normal. No rashes or lesions Neurologic: Grossly normal  EKG AV paced rhythm 60 bpm- personally reviewed   ASSESSMENT AND PLAN:   1. CAD: h/o CABG. Known occluded SVG-diag by cath in 2006. All other grafts patent. NST 2011 showed scar but no ischemia. He denies any ischemic CP. No exertional symptoms. Continue medical therapy for secondary prevention.  2. Ischemic CM/ Chronic Systolic HF: EF 23%. Volume stable. Continue BB, ACE-I and diuretic. He has biV ICD.  3. PAF: rate is controlled with BB. Asymptomatic. On Coumadin for a/c.   4. ICD: followed by Dr. Caryl [name]. He denies any shocks.   5. AAA: followed by Dr. Donzetta [name], Vascular surgery. Last assessment by CT showed stable 3.4 cm infrarenal AAA. BP is well controlled. He is on a BB. Denies tobacco use.   6. HTN: controlled on current regimen. He is on an ACE and diuretic. Recent CMP showed stable renal function and K.   7. HLD: controlled with simvastatin. Recent FLP 07/2016 showed LDL at goal at 58 mg/DL. Hepatic function normal. No changes made.    Follow-Up w/ Dr. Meda [name] in 6 months and in EP clinic as directed.   Justus Duerr Ladoris Gene, MHS [facility] 11/23/2016 10:44 AM

## 2016-11-25 ENCOUNTER — Encounter: Payer: Self-pay | Admitting: Cardiology

## 2016-11-28 ENCOUNTER — Encounter: Payer: Self-pay | Admitting: Internal Medicine

## 2016-11-28 ENCOUNTER — Ambulatory Visit (INDEPENDENT_AMBULATORY_CARE_PROVIDER_SITE_OTHER): Payer: Medicare HMO | Admitting: *Deleted

## 2016-11-28 ENCOUNTER — Ambulatory Visit (INDEPENDENT_AMBULATORY_CARE_PROVIDER_SITE_OTHER): Payer: Medicare HMO | Admitting: Internal Medicine

## 2016-11-28 VITALS — BP 130/90 | HR 60 | Ht 73.0 in | Wt 196.0 lb

## 2016-11-28 DIAGNOSIS — I5022 Chronic systolic (congestive) heart failure: Secondary | ICD-10-CM

## 2016-11-28 DIAGNOSIS — I255 Ischemic cardiomyopathy: Secondary | ICD-10-CM | POA: Diagnosis not present

## 2016-11-28 DIAGNOSIS — Z95810 Presence of automatic (implantable) cardiac defibrillator: Secondary | ICD-10-CM | POA: Diagnosis not present

## 2016-11-28 DIAGNOSIS — I2510 Atherosclerotic heart disease of native coronary artery without angina pectoris: Secondary | ICD-10-CM | POA: Diagnosis not present

## 2016-11-28 DIAGNOSIS — K55069 Acute infarction of intestine, part and extent unspecified: Secondary | ICD-10-CM | POA: Diagnosis not present

## 2016-11-28 DIAGNOSIS — Z5181 Encounter for therapeutic drug level monitoring: Secondary | ICD-10-CM

## 2016-11-28 LAB — CUP PACEART INCLINIC DEVICE CHECK
Brady Statistic RV Percent Paced: 97 %
Date Time Interrogation Session: 20181029040000
HIGH POWER IMPEDANCE MEASURED VALUE: 55 Ohm
HighPow Impedance: 41 Ohm
Implantable Lead Implant Date: 20060808
Implantable Lead Implant Date: 20060911
Implantable Lead Location: 753858
Implantable Lead Location: 753860
Implantable Lead Model: 4087
Implantable Lead Serial Number: 261368
Lead Channel Impedance Value: 403 Ohm
Lead Channel Impedance Value: 456 Ohm
Lead Channel Pacing Threshold Amplitude: 0.9 V
Lead Channel Pacing Threshold Amplitude: 1.3 V
Lead Channel Pacing Threshold Pulse Width: 0.8 ms
Lead Channel Sensing Intrinsic Amplitude: 4.8 mV
Lead Channel Setting Pacing Amplitude: 2.3 V
Lead Channel Setting Pacing Amplitude: 2.4 V
Lead Channel Setting Pacing Pulse Width: 0.4 ms
MDC IDC LEAD IMPLANT DT: 20060808
MDC IDC LEAD LOCATION: 753859
MDC IDC LEAD SERIAL: 165392
MDC IDC MSMT LEADCHNL LV PACING THRESHOLD AMPLITUDE: 0.7 V
MDC IDC MSMT LEADCHNL LV SENSING INTR AMPL: 25 mV — AB
MDC IDC MSMT LEADCHNL RA PACING THRESHOLD PULSEWIDTH: 0.4 ms
MDC IDC MSMT LEADCHNL RV IMPEDANCE VALUE: 539 Ohm
MDC IDC MSMT LEADCHNL RV PACING THRESHOLD PULSEWIDTH: 0.4 ms
MDC IDC MSMT LEADCHNL RV SENSING INTR AMPL: 18.1 mV
MDC IDC PG IMPLANT DT: 20111028
MDC IDC SET LEADCHNL LV PACING AMPLITUDE: 2 V
MDC IDC SET LEADCHNL LV PACING PULSEWIDTH: 0.8 ms
MDC IDC SET LEADCHNL LV SENSING SENSITIVITY: 1 mV
MDC IDC SET LEADCHNL RV SENSING SENSITIVITY: 0.6 mV
MDC IDC STAT BRADY RA PERCENT PACED: 81 %
Pulse Gen Serial Number: 490477

## 2016-11-28 LAB — POCT INR: INR: 2.2

## 2016-11-28 NOTE — Patient Instructions (Signed)
Medication Instructions: Your physician recommends that you continue on your current medications as directed. Please refer to the Current Medication list given to you today.  Labwork: None Ordered  Procedures/Testing: None Ordered  Follow-Up: Your physician wants you to follow-up in: 1 YEAR with Dr. Caryl Comes. You will receive a reminder letter in the mail two months in advance. If you don't receive a letter, please call our office to schedule the follow-up appointment.  Remote monitoring is used to monitor your  ICD from home. This monitoring reduces the number of office visits required to check your device to one time per year. It allows [place] to keep an eye on the functioning of your device to ensure it is working properly. You are scheduled for a device check from home on 02/08/17. You may send your transmission at any time that day. If you have a wireless device, the transmission will be sent automatically. After your physician reviews your transmission, you will receive a postcard with your next transmission date.   Any Additional Special Instructions Will Be Listed Below (If Applicable).     If you need a refill on your cardiac medications before your next appointment, please call your pharmacy.

## 2016-11-28 NOTE — Progress Notes (Signed)
Patient Care Team: [name], MD as PCP - General (Family Medicine) Pyrtle, Lajuan Lines, MD as Consulting Physician (Gastroenterology) Darleen Crocker, MD as Consulting Physician (Ophthalmology) Dorothy Spark, MD as Consulting Physician (Cardiology) Veva Holes as Consulting Physician (Urology) Waynetta Sandy, MD as Consulting Physician (Vascular Surgery) Deboraha Sprang, MD as Consulting Physician (Cardiology)   HPI  [name] is a 83 y.o. male ICD implanted for primary prevention in the setting of ischemic coronary disease with prior CABG as part of the MADIT-CRT trial.   His last catheterization in 2006 demonstrated a total right and 80% marginal. Vein graft to the diagonal was occluded and other bypasses were open; ejection fraction 2007 was about 35%  Nuclear scan Oct 2011>>scar no ischemia  Echo 10/16  EF 35-40%   The patient denies chest pain, shortness of breath, nocturnal dyspnea, orthopnea or peripheral edema.  There have been no palpitations, lightheadedness or syncope.  But he notes that he takes things easy compared to before.  His wife has noted that he is more irritable over the last couple of years. There has been a great deal of stress as he has helped his daughter build a house  Date Cr K  Hgb  8/18 1.36 3.6 14.1            Past Medical History:  Diagnosis Date  . AAA (abdominal aortic aneurysm) ([place])   . Acute bronchitis 09/02/2013  . Acute vascular insufficiency of intestine (HCC)    Mesenteric embolus...surgery 2003  . Anxiety   . Atrial fibrillation (HCC)    paroxysmal  . Back pain 08/08/2016  . Biventricular implantable cardiac defibrillator in situ    GDT CONTAK H170-MADIT-CRT-EXPLANTED 2011 implanted defibrillator- Guidant cognis, model n119-2001 Dr. Caryl Comes (11/28/2009)  . Breast tenderness    Spironolactone was stopped and eplerinone started. Patient feels better  . CAD (coronary artery disease)    CABG 1994  /    catheterization 1996, occluded vein graft to the diagonal, other grafts patent  /   catheterization 2006, no PCI  /  nuclear, October, 2011, extensive scar anteroseptal and apical, no ischemia  . Carotid artery disease ([place])    Doppler, February,  2012, 0-39% bilateral, recommended followup 1 year  . CHF (congestive heart failure) (HCC)    chronic systolic  . Chronotropic incompetence   . Colon polyp   . Colonic polyp 2010   pathology not clear.   . Crohn's disease ([place])   . Degenerative joint disease   . Dermatitis 04/05/2016  . Dizziness    Positional dizziness  . Edema 09/02/2013  . Edema leg    Venous Dopplers 8/13: No DVT bilaterally  . GERD (gastroesophageal reflux disease)   . Hx of CABG    1994  . Hyperglycemia 08/08/2016  . Hyperlipemia   . Hypertension   . Hypertrophy of prostate with urinary obstruction and other lower urinary tract symptoms (LUTS)   . Iron deficiency anemia   . Ischemic cardiomyopathy    CABG 1994 CAD catherization 1996.[name] occluded vein graft to the diagnol, other grafts patent/  catherization 2006, no PCI/ nuclear.[name] october 2011.[name] extensive scar anteroseptal and apical.. no ischemia    . LBBB (left bundle branch block)   . Mitral regurgitation    mild.[name] echo november 2011  EF 35%...echo.. november 2009/ ef 35%.[name] echo november 2011  . Oral thrush 02/05/2015  . Polymyalgia rheumatica ([place])   . Shingles   .  Sinus bradycardia   . Skin cancer 08/08/2016  . Thrombocytopenia ([place]) 2008  . Thrombocytopenia ([place]) 11/30/2011  . Thrush   . Tremor   . Ventral hernia    multiple, wears an abd binder, reports he hs been told he was at increased riisk for surgery  . Vitamin B12 deficiency   . Warfarin anticoagulation    Coumadin therapy for mesenteric embolus 2003    Past Surgical History:  Procedure Laterality Date  . CATARACT EXTRACTION, BILATERAL  2014  . COLONOSCOPY N/A 04/19/2013   Procedure: COLONOSCOPY;  Surgeon: Jerene Bears, MD;  Location: [facility] ENDOSCOPY;   Service: Endoscopy;  Laterality: N/A;  . CORONARY ARTERY BYPASS GRAFT  1994  . Elap w/ superior mesenteric artery embolectomy  1/03   by Dr. Jennette Banker  . ESOPHAGOGASTRODUODENOSCOPY N/A 04/18/2013   Procedure: ESOPHAGOGASTRODUODENOSCOPY (EGD);  Surgeon: Jerene Bears, MD;  Location: [facility] ENDOSCOPY;  Service: Endoscopy;  Laterality: N/A;  . implantation of biventric cardioverter-defibrillatorr     by Dr. Lovena Le in 8/06 Guidant Contak H170-explanted 2011  . implantation of guidant cognis device     model n119-2011  . laparoscopic cholecystectomy  2002  . left inguinal hernia repair with mesh  6/08   by Dr. Betsy Pries  . PROSTATE SURGERY  08/2012   by urology in [place]  . Repair of incarcerated ventral hernia and lysis of adhesions  11/03   by Dr. Betsy Pries    Current Outpatient Prescriptions  Medication Sig Dispense Refill  . carvedilol (COREG) 6.25 MG tablet Take 1 tablet (6.25 mg total) by mouth 2 (two) times daily with a meal. 180 tablet 3  . cholecalciferol (VITAMIN D) 1000 UNITS tablet Take 1,000 Units by mouth daily.     . furosemide (LASIX) 40 MG tablet Take 1 tablet (40 mg total) by mouth 2 (two) times daily. 180 tablet 3  . KLOR-CON M20 20 MEQ tablet TAKE ONE TABLET BY MOUTH ONCE DAILY 90 tablet 2  . lisinopril (PRINIVIL,ZESTRIL) 10 MG tablet Take 1 tablet (10 mg total) by mouth daily. 90 tablet 3  . pantoprazole (PROTONIX) 40 MG tablet TAKE 1 TABLET BY MOUTH  EVERY DAY AT 12 NOON 90 tablet 3  . simvastatin (ZOCOR) 20 MG tablet Take 1 tablet (20 mg total) by mouth at bedtime. 90 tablet 2  . tiZANidine (ZANAFLEX) 2 MG tablet Take 1 tablet (2 mg total) by mouth at bedtime as needed for muscle spasms. 10 tablet 0  . vitamin B-12 (CYANOCOBALAMIN) 1000 MCG tablet Take 1,000 mcg by mouth every other day.    . warfarin (COUMADIN) 5 MG tablet Take as directed by coumadin clinic 100 tablet 1   No current facility-administered medications for this visit.     No Known Allergies  Review of  Systems negative except from HPI and PMH  Physical Exam BP 130/90   Pulse 60   Ht 6' 1"  (1.854 m)   Wt 196 lb (88.9 kg)   BMI 25.86 kg/m  Well developed and nourished in no acute distress HENT normal Neck supple with JVP-flat Clear Device pocket well healed; without hematoma or erythema.  There is no tethering Regular rate and rhythm, no murmurs or gallops Abd-soft with active BS No Clubbing cyanosis edema Skin-warm and dry A & Oriented  Grossly normal sensory and motor function  ECG  AV pacing with BiV configuration    Assessment and  Plan Ischemic cardiomyopathy  Implantable defibrillator-[employer] The patient's device was interrogated.  The information was reviewed. No  changes were made in the programming.    Congestive heart-chronic-systolic  Irritability   Without symptoms of ischemia  Euvolemic continue current meds  Discussed stress and depression as potential contributors to irritablitiy and suggested followup discussions with PCP  He and his wife just celebrated 55 yrs

## 2016-11-29 DIAGNOSIS — N318 Other neuromuscular dysfunction of bladder: Secondary | ICD-10-CM | POA: Diagnosis not present

## 2016-11-29 DIAGNOSIS — N401 Enlarged prostate with lower urinary tract symptoms: Secondary | ICD-10-CM | POA: Diagnosis not present

## 2016-11-29 DIAGNOSIS — N538 Other male sexual dysfunction: Secondary | ICD-10-CM | POA: Diagnosis not present

## 2016-11-29 DIAGNOSIS — N3020 Other chronic cystitis without hematuria: Secondary | ICD-10-CM | POA: Diagnosis not present

## 2016-12-13 ENCOUNTER — Ambulatory Visit (INDEPENDENT_AMBULATORY_CARE_PROVIDER_SITE_OTHER): Payer: Medicare HMO | Admitting: Family Medicine

## 2016-12-13 ENCOUNTER — Encounter: Payer: Self-pay | Admitting: Family Medicine

## 2016-12-13 VITALS — BP 137/84 | HR 58 | Temp 97.6°F | Resp 16 | Wt 191.2 lb

## 2016-12-13 DIAGNOSIS — I10 Essential (primary) hypertension: Secondary | ICD-10-CM | POA: Diagnosis not present

## 2016-12-13 DIAGNOSIS — D696 Thrombocytopenia, unspecified: Secondary | ICD-10-CM

## 2016-12-13 DIAGNOSIS — R739 Hyperglycemia, unspecified: Secondary | ICD-10-CM | POA: Diagnosis not present

## 2016-12-13 DIAGNOSIS — Z0000 Encounter for general adult medical examination without abnormal findings: Secondary | ICD-10-CM

## 2016-12-13 DIAGNOSIS — E782 Mixed hyperlipidemia: Secondary | ICD-10-CM | POA: Diagnosis not present

## 2016-12-13 DIAGNOSIS — E559 Vitamin D deficiency, unspecified: Secondary | ICD-10-CM | POA: Diagnosis not present

## 2016-12-13 DIAGNOSIS — E538 Deficiency of other specified B group vitamins: Secondary | ICD-10-CM | POA: Diagnosis not present

## 2016-12-13 NOTE — Assessment & Plan Note (Signed)
Check cbc 

## 2016-12-13 NOTE — Assessment & Plan Note (Signed)
Tolerating statin, encouraged heart healthy diet, avoid trans fats, minimize simple carbs and saturated fats. Increase exercise as tolerated 

## 2016-12-13 NOTE — Assessment & Plan Note (Signed)
Check level 

## 2016-12-13 NOTE — Assessment & Plan Note (Signed)
Well controlled, no changes to meds. Encouraged heart healthy diet such as the DASH diet and exercise as tolerated.  °

## 2016-12-13 NOTE — Patient Instructions (Addendum)
Benefiber is a fiber supplement that can help with loose stool, white powder mix in a beverage and drink twice daily  Shingrix is the new shingles, 2 shots over 2-6 months at pharmacy   Hypertension Hypertension is another name for high blood pressure. High blood pressure forces your heart to work harder to pump blood. This can cause problems over time. There are two numbers in a blood pressure reading. There is a top number (systolic) over a bottom number (diastolic). It is best to have a blood pressure below 120/80. Healthy choices can help lower your blood pressure. You may need medicine to help lower your blood pressure if:  Your blood pressure cannot be lowered with healthy choices.  Your blood pressure is higher than 130/80.  Follow these instructions at home: Eating and drinking  If directed, follow the DASH eating plan. This diet includes: ? Filling half of your plate at each meal with fruits and vegetables. ? Filling one quarter of your plate at each meal with whole grains. Whole grains include whole wheat pasta, brown rice, and whole grain bread. ? Eating or drinking low-fat dairy products, such as skim milk or low-fat yogurt. ? Filling one quarter of your plate at each meal with low-fat (lean) proteins. Low-fat proteins include fish, skinless chicken, eggs, beans, and tofu. ? Avoiding fatty meat, cured and processed meat, or chicken with skin. ? Avoiding premade or processed food.  Eat less than 1,500 mg of salt (sodium) a day.  Limit alcohol use to no more than 1 drink a day for nonpregnant women and 2 drinks a day for men. One drink equals 12 oz of beer, 5 oz of wine, or 1 oz of hard liquor. Lifestyle  Work with your doctor to stay at a healthy weight or to lose weight. Ask your doctor what the best weight is for you.  Get at least 30 minutes of exercise that causes your heart to beat faster (aerobic exercise) most days of the week. This may include walking, swimming, or  biking.  Get at least 30 minutes of exercise that strengthens your muscles (resistance exercise) at least 3 days a week. This may include lifting weights or pilates.  Do not use any products that contain nicotine or tobacco. This includes cigarettes and e-cigarettes. If you need help quitting, ask your doctor.  Check your blood pressure at home as told by your doctor.  Keep all follow-up visits as told by your doctor. This is important. Medicines  Take over-the-counter and prescription medicines only as told by your doctor. Follow directions carefully.  Do not skip doses of blood pressure medicine. The medicine does not work as well if you skip doses. Skipping doses also puts you at risk for problems.  Ask your doctor about side effects or reactions to medicines that you should watch for. Contact a doctor if:  You think you are having a reaction to the medicine you are taking.  You have headaches that keep coming back (recurring).  You feel dizzy.  You have swelling in your ankles.  You have trouble with your vision. Get help right away if:  You get a very bad headache.  You start to feel confused.  You feel weak or numb.  You feel faint.  You get very bad pain in your: ? Chest. ? Belly (abdomen).  You throw up (vomit) more than once.  You have trouble breathing. Summary  Hypertension is another name for high blood pressure.  Making healthy choices  can help lower blood pressure. If your blood pressure cannot be controlled with healthy choices, you may need to take medicine. This information is not intended to replace advice given to you by your health care provider. Make sure you discuss any questions you have with your health care provider. Document Released: 07/06/2007 Document Revised: 12/16/2015 Document Reviewed: 12/16/2015 Elsevier Interactive Patient Education  2018 [place] 65 Years and Older, Male Preventive care refers to  lifestyle choices and visits with your health care provider that can promote health and wellness. What does preventive care include? A yearly physical exam. This is also called an annual well check. Dental exams once or twice a year. Routine eye exams. Ask your health care provider how often you should have your eyes checked. Personal lifestyle choices, including: Daily care of your teeth and gums. Regular physical activity. Eating a healthy diet. Avoiding tobacco and drug use. Limiting alcohol use. Practicing safe sex. Taking low doses of aspirin every day. Taking vitamin and mineral supplements as recommended by your health care provider. What happens during an annual well check? The services and screenings done by your health care provider during your annual well check will depend on your age, overall health, lifestyle risk factors, and family history of disease. Counseling Your health care provider may ask you questions about your: Alcohol use. Tobacco use. Drug use. Emotional well-being. Home and relationship well-being. Sexual activity. Eating habits. History of falls. Memory and ability to understand (cognition). Work and work Statistician.  Screening You may have the following tests or measurements: Height, weight, and BMI. Blood pressure. Lipid and cholesterol levels. These may be checked every 5 years, or more frequently if you are over 45 years old. Skin check. Lung cancer screening. You may have this screening every year starting at age 63 if you have a 30-pack-year history of smoking and currently smoke or have quit within the past 15 years. Fecal occult blood test (FOBT) of the stool. You may have this test every year starting at age 20. Flexible sigmoidoscopy or colonoscopy. You may have a sigmoidoscopy every 5 years or a colonoscopy every 10 years starting at age 25. Prostate cancer screening. Recommendations will vary depending on your family history and other  risks. Hepatitis C blood test. Hepatitis B blood test. Sexually transmitted disease (STD) testing. Diabetes screening. This is done by checking your blood sugar (glucose) after you have not eaten for a while (fasting). You may have this done every 1-3 years. Abdominal aortic aneurysm (AAA) screening. You may need this if you are a current or former smoker. Osteoporosis. You may be screened starting at age 27 if you are at high risk.  Talk with your health care provider about your test results, treatment options, and if necessary, the need for more tests. Vaccines Your health care provider may recommend certain vaccines, such as: Influenza vaccine. This is recommended every year. Tetanus, diphtheria, and acellular pertussis (Tdap, Td) vaccine. You may need a Td booster every 10 years. Varicella vaccine. You may need this if you have not been vaccinated. Zoster vaccine. You may need this after age 81. Measles, mumps, and rubella (MMR) vaccine. You may need at least one dose of MMR if you were born in 1957 or later. You may also need a second dose. Pneumococcal 13-valent conjugate (PCV13) vaccine. One dose is recommended after age 61. Pneumococcal polysaccharide (PPSV23) vaccine. One dose is recommended after age 50. Meningococcal vaccine. You may need this if you have  certain conditions. Hepatitis A vaccine. You may need this if you have certain conditions or if you travel or work in places where you may be exposed to hepatitis A. Hepatitis B vaccine. You may need this if you have certain conditions or if you travel or work in places where you may be exposed to hepatitis B. Haemophilus influenzae type b (Hib) vaccine. You may need this if you have certain risk factors.  Talk to your health care provider about which screenings and vaccines you need and how often you need them. This information is not intended to replace advice given to you by your health care provider. Make sure you discuss any  questions you have with your health care provider. Document Released: 02/13/2015 Document Revised: 10/07/2015 Document Reviewed: 11/18/2014 Elsevier Interactive Patient Education  2017 [employer].

## 2016-12-13 NOTE — Progress Notes (Signed)
Subjective:  I acted as a Education administrator for [employer]. Yancey Flemings, [place]   Patient ID: [name], male    DOB: December 16, 1931, 83 y.o.   MRN: [id number]  Chief Complaint  Patient presents with  . Annual Exam    HPI  Patient is in today for annual exam and follow up on chronic medical concerns. No recent febrile illness or hospitalizations. He is noting some intermittent loose stool but not numeorus episodes each day. No bloody or tarry stool. Follows with gastroenterology Dr Hilarie Fredrickson. Is trying to stay active and maintain a heart healthy diet. Denies CP/palp/SOB/HA/congestion/fevers or GU c/o. Taking meds as prescribed  Patient Care Team: [name], MD as PCP - General (Family Medicine) Pyrtle, Lajuan Lines, MD as Consulting Physician (Gastroenterology) Darleen Crocker, MD as Consulting Physician (Ophthalmology) Dorothy Spark, MD as Consulting Physician (Cardiology) Veva Holes as Consulting Physician (Urology) Waynetta Sandy, MD as Consulting Physician (Vascular Surgery) Deboraha Sprang, MD as Consulting Physician (Cardiology)   Past Medical History:  Diagnosis Date  . AAA (abdominal aortic aneurysm) ([place])   . Acute bronchitis 09/02/2013  . Acute vascular insufficiency of intestine (HCC)    Mesenteric embolus...surgery 2003  . Anxiety   . Atrial fibrillation (HCC)    paroxysmal  . Back pain 08/08/2016  . Biventricular implantable cardiac defibrillator in situ    GDT CONTAK H170-MADIT-CRT-EXPLANTED 2011 implanted defibrillator- Guidant cognis, model n119-2001 Dr. Caryl Comes (11/28/2009)  . Breast tenderness    Spironolactone was stopped and eplerinone started. Patient feels better  . CAD (coronary artery disease)    CABG 1994  /   catheterization 1996, occluded vein graft to the diagonal, other grafts patent  /   catheterization 2006, no PCI  /  nuclear, October, 2011, extensive scar anteroseptal and apical, no ischemia  . Carotid artery disease ([place])    Doppler, February,   2012, 0-39% bilateral, recommended followup 1 year  . CHF (congestive heart failure) (HCC)    chronic systolic  . Chronotropic incompetence   . Colon polyp   . Colonic polyp 2010   pathology not clear.   . Crohn's disease ([place])   . Degenerative joint disease   . Dermatitis 04/05/2016  . Dizziness    Positional dizziness  . Edema 09/02/2013  . Edema leg    Venous Dopplers 8/13: No DVT bilaterally  . GERD (gastroesophageal reflux disease)   . Hx of CABG    1994  . Hyperglycemia 08/08/2016  . Hyperlipemia   . Hypertension   . Hypertrophy of prostate with urinary obstruction and other lower urinary tract symptoms (LUTS)   . Iron deficiency anemia   . Ischemic cardiomyopathy    CABG 1994 CAD catherization 1996.[name] occluded vein graft to the diagnol, other grafts patent/  catherization 2006, no PCI/ nuclear.[name] october 2011.[name] extensive scar anteroseptal and apical.. no ischemia    . LBBB (left bundle branch block)   . Mitral regurgitation    mild.[name] echo november 2011  EF 35%...echo.. november 2009/ ef 35%.[name] echo november 2011  . Oral thrush 02/05/2015  . Polymyalgia rheumatica ([place])   . Shingles   . Sinus bradycardia   . Skin cancer 08/08/2016  . Thrombocytopenia ([place]) 2008  . Thrombocytopenia ([place]) 11/30/2011  . Thrush   . Tremor   . Ventral hernia    multiple, wears an abd binder, reports he hs been told he was at increased riisk for surgery  . Vitamin B12 deficiency   . Warfarin anticoagulation  Coumadin therapy for mesenteric embolus 2003    Past Surgical History:  Procedure Laterality Date  . CATARACT EXTRACTION, BILATERAL  2014  . COLONOSCOPY N/A 04/19/2013   Performed by Jerene Bears, MD at [facility] ENDOSCOPY  . CORONARY ARTERY BYPASS GRAFT  1994  . Elap w/ superior mesenteric artery embolectomy  1/03   by Dr. Jennette Banker  . ESOPHAGOGASTRODUODENOSCOPY (EGD) N/A 04/18/2013   Performed by Jerene Bears, MD at [facility] ENDOSCOPY  . implantation of biventric cardioverter-defibrillatorr     by  Dr. Lovena Le in 8/06 Guidant Contak H170-explanted 2011  . implantation of guidant cognis device     model n119-2011  . laparoscopic cholecystectomy  2002  . left inguinal hernia repair with mesh  6/08   by Dr. Betsy Pries  . PROSTATE SURGERY  08/2012   by urology in [place]  . Repair of incarcerated ventral hernia and lysis of adhesions  11/03   by Dr. Betsy Pries    Family History  Problem Relation Age of Onset  . Heart disease Mother   . Tremor Sister   . Heart failure Sister   . Heart disease Father 27  . Hypertension Father   . Appendicitis Brother        ruptured  . Diabetes Son   . Heart disease Sister   . Hypertension Sister   . Dementia Sister   . Heart disease Sister   . Hypertension Brother   . Kidney disease Brother   . Myasthenia gravis Brother   . Cirrhosis Brother   . Leukemia Brother   . Hypertension Unknown   . Hyperlipidemia Unknown   . Heart disease Sister   . Edema Sister        pedal edema    Social History   Socioeconomic History  . Marital status: Married    Spouse name: Kennyth Lose  . Number of children: 3  . Years of education: Not on file  . Highest education level: Not on file  Social Needs  . Financial resource strain: Not on file  . Food insecurity - worry: Not on file  . Food insecurity - inability: Not on file  . Transportation needs - medical: Not on file  . Transportation needs - non-medical: Not on file  Occupational History  . Occupation: bull Actuary  . Occupation: farmer    Fish farm manager: RETIRED  . Occupation: logger  Tobacco Use  . Smoking status: Former Smoker    Packs/day: 1.00    Years: 20.00    Pack years: 20.00    Types: Cigarettes    Last attempt to quit: 01/31/1973    Years since quitting: 43.9  . Smokeless tobacco: Never Used  Substance and Sexual Activity  . Alcohol use: Yes    Alcohol/week: 0.0 oz    Comment: occasional  . Drug use: No  . Sexual activity: No    Comment: lives with wife, no dietary restrictions    Other Topics Concern  . Not on file  Social History Narrative   Lives in [place], [place] with wife.     Outpatient Medications Prior to Visit  Medication Sig Dispense Refill  . carvedilol (COREG) 6.25 MG tablet Take 1 tablet (6.25 mg total) by mouth 2 (two) times daily with a meal. 180 tablet 3  . cholecalciferol (VITAMIN D) 1000 UNITS tablet Take 1,000 Units by mouth daily.     . furosemide (LASIX) 40 MG tablet Take 1 tablet (40 mg total) by mouth 2 (two) times daily. 180 tablet  3  . KLOR-CON M20 20 MEQ tablet TAKE ONE TABLET BY MOUTH ONCE DAILY 90 tablet 2  . lisinopril (PRINIVIL,ZESTRIL) 10 MG tablet Take 1 tablet (10 mg total) by mouth daily. 90 tablet 3  . pantoprazole (PROTONIX) 40 MG tablet TAKE 1 TABLET BY MOUTH  EVERY DAY AT 12 NOON 90 tablet 3  . simvastatin (ZOCOR) 20 MG tablet Take 1 tablet (20 mg total) by mouth at bedtime. 90 tablet 2  . tiZANidine (ZANAFLEX) 2 MG tablet Take 1 tablet (2 mg total) by mouth at bedtime as needed for muscle spasms. 10 tablet 0  . vitamin B-12 (CYANOCOBALAMIN) 1000 MCG tablet Take 1,000 mcg by mouth every other day.    . warfarin (COUMADIN) 5 MG tablet Take as directed by coumadin clinic 100 tablet 1   No facility-administered medications prior to visit.     No Known Allergies  Review of Systems  Constitutional: Negative for fever and malaise/fatigue.  HENT: Negative for congestion.   Respiratory: Negative for cough and shortness of breath.   Cardiovascular: Negative for chest pain and palpitations.  Gastrointestinal: Positive for diarrhea. Negative for abdominal pain, blood in stool, constipation, melena, nausea and vomiting.  Musculoskeletal: Negative for back pain.  Skin: Negative for rash.  Neurological: Negative for loss of consciousness and headaches.       Objective:    Physical Exam  Constitutional: He is oriented to person, place, and time. He appears well-developed and well-nourished. No distress.  HENT:  Head:  Normocephalic and atraumatic.  Eyes: Conjunctivae are normal.  Neck: Normal range of motion. No thyromegaly present.  Cardiovascular: Normal rate and regular rhythm.  Pulmonary/Chest: Effort normal and breath sounds normal. He has no wheezes.  Abdominal: Soft. Bowel sounds are normal. There is no tenderness.  Musculoskeletal: Normal range of motion. He exhibits no edema or deformity.  Neurological: He is alert and oriented to person, place, and time. He has normal reflexes. He displays normal reflexes.  Skin: Skin is warm and dry. He is not diaphoretic.  Psychiatric: He has a normal mood and affect.    BP 137/84 (BP Location: Left Arm, Patient Position: Sitting, Cuff Size: Small)   Pulse (!) 58   Temp 97.6 F (36.4 C) (Oral)   Resp 16   Wt 191 lb 3.2 oz (86.7 kg)   SpO2 96%   BMI 25.23 kg/m  Wt Readings from Last 3 Encounters:  12/13/16 191 lb 3.2 oz (86.7 kg)  11/28/16 196 lb (88.9 kg)  11/23/16 195 lb 9.6 oz (88.7 kg)   BP Readings from Last 3 Encounters:  12/13/16 137/84  11/28/16 130/90  11/23/16 120/68     Immunization History  Administered Date(s) Administered  . Influenza Split 11/24/2010, 11/30/2011, 10/31/2012  . Influenza Whole 11/06/2008  . Influenza, High Dose Seasonal PF 11/10/2015  . Influenza,inj,Quad PF,6+ Mos 12/30/2013, 12/09/2014  . Pneumococcal Conjugate-13 12/09/2014  . Pneumococcal Polysaccharide-23 03/11/2013  . Tdap 03/11/2013    Health Maintenance  Topic Date Due  . INFLUENZA VACCINE  08/31/2016  . COLONOSCOPY  04/20/2018  . TETANUS/TDAP  03/12/2023  . PNA vac Low Risk Adult  Completed    Lab Results  Component Value Date   WBC 3.7 (L) 09/03/2016   HGB 14.1 09/03/2016   HCT 42.3 09/03/2016   PLT 54 (L) 09/03/2016   GLUCOSE 159 (H) 09/03/2016   CHOL 131 08/08/2016   TRIG 106.0 08/08/2016   HDL 52.10 08/08/2016   LDLDIRECT 81.6 12/30/2013   LDLCALC 58 08/08/2016  ALT 22 09/03/2016   AST 31 09/03/2016   NA 133 (L) 09/03/2016   K  3.6 09/03/2016   CL 96 (L) 09/03/2016   CREATININE 1.36 (H) 09/03/2016   BUN 16 09/03/2016   CO2 30 09/03/2016   TSH 2.93 08/08/2016   PSA 2.93 11/24/2010   INR 2.0 12/14/2016   HGBA1C 5.6 08/08/2016    Lab Results  Component Value Date   TSH 2.93 08/08/2016   Lab Results  Component Value Date   WBC 3.7 (L) 09/03/2016   HGB 14.1 09/03/2016   HCT 42.3 09/03/2016   MCV 89.8 09/03/2016   PLT 54 (L) 09/03/2016   Lab Results  Component Value Date   NA 133 (L) 09/03/2016   K 3.6 09/03/2016   CO2 30 09/03/2016   GLUCOSE 159 (H) 09/03/2016   BUN 16 09/03/2016   CREATININE 1.36 (H) 09/03/2016   BILITOT 2.0 (H) 09/03/2016   ALKPHOS 62 09/03/2016   AST 31 09/03/2016   ALT 22 09/03/2016   PROT 5.5 (L) 09/03/2016   ALBUMIN 3.1 (L) 09/03/2016   CALCIUM 8.8 (L) 09/03/2016   ANIONGAP 7 09/03/2016   GFR 66.52 08/08/2016   Lab Results  Component Value Date   CHOL 131 08/08/2016   Lab Results  Component Value Date   HDL 52.10 08/08/2016   Lab Results  Component Value Date   LDLCALC 58 08/08/2016   Lab Results  Component Value Date   TRIG 106.0 08/08/2016   Lab Results  Component Value Date   CHOLHDL 3 08/08/2016   Lab Results  Component Value Date   HGBA1C 5.6 08/08/2016         Assessment & Plan:   Problem List Items Addressed This Visit    Vitamin B 12 deficiency    Check  level      Relevant Orders   Vitamin B12   Vitamin D deficiency    Check level       Relevant Orders   VITAMIN D 25 Hydroxy (Vit-D Deficiency, Fractures)   Hypertension    Well controlled, no changes to meds. Encouraged heart healthy diet such as the DASH diet and exercise as tolerated.       Relevant Orders   Comprehensive metabolic panel   Lipid panel   TSH   Hyperlipemia    Tolerating statin, encouraged heart healthy diet, avoid trans fats, minimize simple carbs and saturated fats. Increase exercise as tolerated      Thrombocytopenia (HCC)    Check cbc       Relevant Orders   CBC with Differential/Platelet   Hyperglycemia    hgba1c acceptable, minimize simple carbs. Increase exercise as tolerated.       Relevant Orders   Hemoglobin A1c   Preventative health care    Patient encouraged to maintain heart healthy diet, regular exercise, adequate sleep. Consider daily probiotics. Take medications as prescribed. Labs reviewed         I am having [name] maintain his cholecalciferol, vitamin B-12, furosemide, lisinopril, pantoprazole, KLOR-CON M20, carvedilol, simvastatin, tiZANidine, and warfarin.  No orders of the defined types were placed in this encounter.   CMA served as Education administrator during this visit. History, Physical and Plan performed by medical provider. Documentation and orders reviewed and attested to.  Penni Homans, MD

## 2016-12-13 NOTE — Assessment & Plan Note (Signed)
hgba1c acceptable, minimize simple carbs. Increase exercise as tolerated.  

## 2016-12-14 ENCOUNTER — Ambulatory Visit (INDEPENDENT_AMBULATORY_CARE_PROVIDER_SITE_OTHER): Payer: Medicare HMO

## 2016-12-14 DIAGNOSIS — Z5181 Encounter for therapeutic drug level monitoring: Secondary | ICD-10-CM

## 2016-12-14 DIAGNOSIS — K55069 Acute infarction of intestine, part and extent unspecified: Secondary | ICD-10-CM

## 2016-12-14 LAB — POCT INR: INR: 2

## 2016-12-14 NOTE — Patient Instructions (Addendum)
Continue taking 78m daily.  Recheck INR in 4 weeks. Call our office if you have any concerns or are placed on any new medications [phone]

## 2016-12-18 NOTE — Assessment & Plan Note (Signed)
Patient encouraged to maintain heart healthy diet, regular exercise, adequate sleep. Consider daily probiotics. Take medications as prescribed. Labs reviewed 

## 2017-01-02 ENCOUNTER — Ambulatory Visit (INDEPENDENT_AMBULATORY_CARE_PROVIDER_SITE_OTHER): Payer: Medicare HMO

## 2017-01-02 DIAGNOSIS — Z95810 Presence of automatic (implantable) cardiac defibrillator: Secondary | ICD-10-CM | POA: Diagnosis not present

## 2017-01-02 DIAGNOSIS — I5022 Chronic systolic (congestive) heart failure: Secondary | ICD-10-CM

## 2017-01-02 NOTE — Progress Notes (Signed)
EPIC Encounter for ICM Monitoring  Patient Name: [name] is a 83 y.o. male Date: 01/02/2017 Primary Care Physican: [name], MD Primary Cardiologist:[place] Electrophysiologist: Faustino Congress Weight:Last known weight 197 lbs Right Ventricular Pacing 97% and Left Ventricular Pacing 98%        Attempted call to patient and unable to reach.  Left message to return call.  Transmission reviewed.       Prescribed dosage: Furosemide 40 mg 1 tablet twice a day. Potassium 20 mEq 1 tablet daily  Recommendations: NONE - Unable to reach.  Follow-up plan: ICM clinic phone appointment on 02/06/2017.          Copy of ICM check sent to Dr. Caryl Comes.  3 Month Trend    8 Day Data Trend

## 2017-01-03 ENCOUNTER — Telehealth: Payer: Self-pay

## 2017-01-03 NOTE — Telephone Encounter (Signed)
Remote ICM transmission received.  Attempted call to patient and left message to return call.

## 2017-01-11 ENCOUNTER — Ambulatory Visit (INDEPENDENT_AMBULATORY_CARE_PROVIDER_SITE_OTHER): Payer: Medicare HMO | Admitting: *Deleted

## 2017-01-11 DIAGNOSIS — K55069 Acute infarction of intestine, part and extent unspecified: Secondary | ICD-10-CM

## 2017-01-11 DIAGNOSIS — Z5181 Encounter for therapeutic drug level monitoring: Secondary | ICD-10-CM | POA: Diagnosis not present

## 2017-01-11 DIAGNOSIS — Z0000 Encounter for general adult medical examination without abnormal findings: Secondary | ICD-10-CM

## 2017-01-11 LAB — POCT INR: INR: 2.3

## 2017-01-11 NOTE — Patient Instructions (Signed)
Continue taking 31m daily.  Recheck INR in 4 weeks. Call our office if you have any concerns or are placed on any new medications [phone]

## 2017-01-19 ENCOUNTER — Ambulatory Visit (INDEPENDENT_AMBULATORY_CARE_PROVIDER_SITE_OTHER): Payer: Medicare HMO | Admitting: Family Medicine

## 2017-01-19 ENCOUNTER — Encounter: Payer: Self-pay | Admitting: Family Medicine

## 2017-01-19 VITALS — BP 128/70 | HR 58 | Temp 97.9°F | Ht 73.0 in | Wt 189.1 lb

## 2017-01-19 DIAGNOSIS — B9689 Other specified bacterial agents as the cause of diseases classified elsewhere: Secondary | ICD-10-CM | POA: Diagnosis not present

## 2017-01-19 DIAGNOSIS — J029 Acute pharyngitis, unspecified: Secondary | ICD-10-CM

## 2017-01-19 DIAGNOSIS — J208 Acute bronchitis due to other specified organisms: Secondary | ICD-10-CM

## 2017-01-19 LAB — POCT RAPID STREP A (OFFICE): Rapid Strep A Screen: NEGATIVE

## 2017-01-19 MED ORDER — AZITHROMYCIN 250 MG PO TABS: ORAL_TABLET | ORAL | 0 refills | Status: DC

## 2017-01-19 NOTE — Addendum Note (Signed)
Addended by: Sharon Seller B on: 01/19/2017 12:01 PM   Modules accepted: Orders

## 2017-01-19 NOTE — Progress Notes (Signed)
Pre visit review using our clinic review tool, if applicable. No additional management support is needed unless otherwise documented below in the visit note. 

## 2017-01-19 NOTE — Progress Notes (Signed)
Chief Complaint  Patient presents with  . Cough    sore throat    [name] here for URI complaints.  Duration: 3 days  Associated symptoms: sore throat, fevers 101 F, and cough Denies: sinus congestion, sinus pain, rhinorrhea, ear pain, ear drainage, wheezing, shortness of breath, myalgia and rigors Treatment to date: Mucinex Sick contacts: No  ROS:  Const: +nighttime fevers HEENT: As noted in HPI Lungs: No SOB  Past Medical History:  Diagnosis Date  . AAA (abdominal aortic aneurysm) ([place])   . Acute bronchitis 09/02/2013  . Acute vascular insufficiency of intestine (HCC)    Mesenteric embolus...surgery 2003  . Anxiety   . Atrial fibrillation (HCC)    paroxysmal  . Back pain 08/08/2016  . Biventricular implantable cardiac defibrillator in situ    GDT CONTAK H170-MADIT-CRT-EXPLANTED 2011 implanted defibrillator- [employer]ant cognis, model n119-2001 Dr. Caryl Comes (11/28/2009)  . Breast tenderness    Spironolactone was stopped and eplerinone started. Patient feels better  . CAD (coronary artery disease)    CABG 1994  /   catheterization 1996, occluded vein graft to the diagonal, other grafts patent  /   catheterization 2006, no PCI  /  nuclear, October, 2011, extensive scar anteroseptal and apical, no ischemia  . Carotid artery disease ([place])    Doppler, February,  2012, 0-39% bilateral, recommended followup 1 year  . CHF (congestive heart failure) (HCC)    chronic systolic  . Chronotropic incompetence   . Colon polyp   . Colonic polyp 2010   pathology not clear.   . Crohn's disease ([place])   . Degenerative joint disease   . Dermatitis 04/05/2016  . Dizziness    Positional dizziness  . Edema 09/02/2013  . Edema leg    Venous Dopplers 8/13: No DVT bilaterally  . GERD (gastroesophageal reflux disease)   . Hx of CABG    1994  . Hyperglycemia 08/08/2016  . Hyperlipemia   . Hypertension   . Hypertrophy of prostate with urinary obstruction and other lower urinary tract symptoms (LUTS)    . Iron deficiency anemia   . Ischemic cardiomyopathy    CABG 1994 CAD catherization 1996.[name] occluded vein graft to the diagnol, other grafts patent/  catherization 2006, no PCI/ nuclear.[name] october 2011.[name] extensive scar anteroseptal and apical.. no ischemia    . LBBB (left bundle branch block)   . Mitral regurgitation    mild.[name] echo november 2011  EF 35%...echo.. november 2009/ ef 35%.[name] echo november 2011  . Oral thrush 02/05/2015  . Polymyalgia rheumatica ([place])   . Shingles   . Sinus bradycardia   . Skin cancer 08/08/2016  . Thrombocytopenia ([place]) 2008  . Thrombocytopenia ([place]) 11/30/2011  . Thrush   . Tremor   . Ventral hernia    multiple, wears an abd binder, reports he hs been told he was at increased riisk for surgery  . Vitamin B12 deficiency   . Warfarin anticoagulation    Coumadin therapy for mesenteric embolus 2003     BP 128/70 (BP Location: Left Arm, Patient Position: Sitting)   Pulse (!) 58   Temp 97.9 F (36.6 C) (Oral)   Ht 6' 1"  (1.854 m)   Wt 189 lb 2 oz (85.8 kg)   SpO2 93%   BMI 24.95 kg/m  General: Awake, alert, appears stated age HEENT: AT, [place], ears patent b/l and TM's neg, nares patent w/o discharge, pharynx pink and without exudates, MMM Neck: No masses or asymmetry; +TTP over cerv ln's Heart: RRR,  no murmurs, no bruits Lungs: CTAB, no accessory muscle use Psych: Age appropriate judgment and insight, normal mood and affect  Acute bacterial bronchitis - Plan: azithromycin (ZITHROMAX) 250 MG tablet  Tx given fevers. Pt has been on zpak in past and has done well.  Continue to push fluids, practice good hand hygiene, cover mouth when coughing. F/u prn. If starting to experience fevers, shaking, or shortness of breath, seek immediate care. Pt and his wife voiced understanding and agreement to the plan.  Murray, DO 01/19/17 11:58 AM

## 2017-01-19 NOTE — Patient Instructions (Signed)
Continue to push fluids, practice good hand hygiene, and cover your mouth if you cough.  If you start having fevers, shaking or shortness of breath, seek immediate care.  Let us know if you need anything.  

## 2017-01-20 ENCOUNTER — Telehealth: Payer: Self-pay | Admitting: Family Medicine

## 2017-01-20 ENCOUNTER — Ambulatory Visit: Payer: Self-pay

## 2017-01-20 MED ORDER — AMOXICILLIN-POT CLAVULANATE 875-125 MG PO TABS: 1.0000 | ORAL_TABLET | Freq: Two times a day (BID) | ORAL | 0 refills | Status: DC

## 2017-01-20 NOTE — Telephone Encounter (Signed)
Called the patients wife clarified to send antibiotic to [facility] in [place]. Removed [facility] from his list as they no longer use that pharmacy.

## 2017-01-20 NOTE — Telephone Encounter (Signed)
This encounter was created in error - please disregard.

## 2017-01-20 NOTE — Telephone Encounter (Signed)
A new abx has been pended depending on which pharmacy they want to use. TY.

## 2017-01-20 NOTE — Telephone Encounter (Signed)
The wife called today and stated the patient is no better. Stated he is still running a fever, having a sore throat and a headache. They would like something else sent in. Feels the antibiotic sent in is not working, he is no better. The wife stated she has seen white spots in his throat. He is having difficulty swallowing and with the weekend here they are concerned what to do with him. Pharmacy is [facility] [address] in [place]. Call back number if something is sent in [phone].

## 2017-01-20 NOTE — Addendum Note (Signed)
Addended by: Matilde Sprang on: 01/20/2017 05:40 PM   Modules accepted: Level of Service, SmartSet

## 2017-01-20 NOTE — Addendum Note (Signed)
Addended by: Matilde Sprang on: 01/20/2017 05:48 PM   Modules accepted: Miquel Dunn

## 2017-02-08 ENCOUNTER — Ambulatory Visit (INDEPENDENT_AMBULATORY_CARE_PROVIDER_SITE_OTHER): Payer: Medicare HMO | Admitting: *Deleted

## 2017-02-08 ENCOUNTER — Ambulatory Visit (INDEPENDENT_AMBULATORY_CARE_PROVIDER_SITE_OTHER): Payer: Medicare HMO

## 2017-02-08 DIAGNOSIS — Z0000 Encounter for general adult medical examination without abnormal findings: Secondary | ICD-10-CM

## 2017-02-08 DIAGNOSIS — K55069 Acute infarction of intestine, part and extent unspecified: Secondary | ICD-10-CM

## 2017-02-08 DIAGNOSIS — Z5181 Encounter for therapeutic drug level monitoring: Secondary | ICD-10-CM

## 2017-02-08 DIAGNOSIS — I255 Ischemic cardiomyopathy: Secondary | ICD-10-CM

## 2017-02-08 LAB — POCT INR: INR: 2

## 2017-02-08 NOTE — Patient Instructions (Signed)
Description   Continue on same dosage 37m daily.  Recheck INR in 4 weeks. Call our office if you have any concerns or are placed on any new medications [phone]

## 2017-02-08 NOTE — Progress Notes (Signed)
Remote ICD transmission.   

## 2017-02-09 ENCOUNTER — Encounter: Payer: Self-pay | Admitting: Cardiology

## 2017-02-10 LAB — CUP PACEART REMOTE DEVICE CHECK
Battery Remaining Percentage: 45 %
Brady Statistic RA Percent Paced: 93 %
Brady Statistic RV Percent Paced: 97 %
Date Time Interrogation Session: 20190107064200
HIGH POWER IMPEDANCE MEASURED VALUE: 56 Ohm
Implantable Lead Implant Date: 20060808
Implantable Lead Implant Date: 20060808
Implantable Lead Implant Date: 20060911
Implantable Lead Location: 753858
Implantable Lead Model: 158
Implantable Lead Model: 4087
Lead Channel Impedance Value: 443 Ohm
Lead Channel Pacing Threshold Pulse Width: 0.4 ms
Lead Channel Pacing Threshold Pulse Width: 0.4 ms
Lead Channel Pacing Threshold Pulse Width: 0.8 ms
Lead Channel Setting Pacing Amplitude: 2.3 V
Lead Channel Setting Pacing Pulse Width: 0.4 ms
Lead Channel Setting Pacing Pulse Width: 0.8 ms
MDC IDC LEAD LOCATION: 753859
MDC IDC LEAD LOCATION: 753860
MDC IDC LEAD SERIAL: 165392
MDC IDC LEAD SERIAL: 261368
MDC IDC MSMT BATTERY REMAINING LONGEVITY: 30 mo
MDC IDC MSMT LEADCHNL LV IMPEDANCE VALUE: 413 Ohm
MDC IDC MSMT LEADCHNL LV PACING THRESHOLD AMPLITUDE: 0.7 V
MDC IDC MSMT LEADCHNL RA PACING THRESHOLD AMPLITUDE: 1.3 V
MDC IDC MSMT LEADCHNL RV IMPEDANCE VALUE: 517 Ohm
MDC IDC MSMT LEADCHNL RV PACING THRESHOLD AMPLITUDE: 0.9 V
MDC IDC PG IMPLANT DT: 20111028
MDC IDC SET LEADCHNL LV PACING AMPLITUDE: 2 V
MDC IDC SET LEADCHNL LV SENSING SENSITIVITY: 1 mV
MDC IDC SET LEADCHNL RV PACING AMPLITUDE: 2.4 V
MDC IDC SET LEADCHNL RV SENSING SENSITIVITY: 0.6 mV
Pulse Gen Serial Number: 490477

## 2017-02-20 ENCOUNTER — Ambulatory Visit (INDEPENDENT_AMBULATORY_CARE_PROVIDER_SITE_OTHER): Payer: Medicare HMO

## 2017-02-20 DIAGNOSIS — I5022 Chronic systolic (congestive) heart failure: Secondary | ICD-10-CM

## 2017-02-20 DIAGNOSIS — Z95810 Presence of automatic (implantable) cardiac defibrillator: Secondary | ICD-10-CM

## 2017-02-20 NOTE — Progress Notes (Signed)
EPIC Encounter for ICM Monitoring  Patient Name: [name] is a 84 y.o. male Date: 02/20/2017 Primary Care Physican: [name], MD Primary Cardiologist:Nelson Electrophysiologist: Faustino Congress Weight:186 lbs Right Ventricular Pacing 97% and Left Ventricular Pacing 98%         Heart Failure questions reviewed, pt asymptomatic.     Prescribed dosage: Furosemide 40 mg 1 tablet twice a day. Potassium 20 mEq 1 tablet daily  Recommendations: No changes.  Encouraged to call for fluid symptoms.  Follow-up plan: ICM clinic phone appointment on 03/23/2017.          Copy of ICM check sent to Dr. Caryl Comes.  3 Month Trend    8 Day Data Trend            Rosalene Billings, RN 02/20/2017 12:18 PM

## 2017-02-23 ENCOUNTER — Telehealth: Payer: Self-pay | Admitting: Pulmonary Disease

## 2017-02-23 NOTE — Telephone Encounter (Signed)
Per SN: Ok.

## 2017-02-23 NOTE — Telephone Encounter (Signed)
SN please advise if you are ok to see this pt or would you rather him get set up with a new provider?  Thanks

## 2017-02-27 ENCOUNTER — Other Ambulatory Visit (INDEPENDENT_AMBULATORY_CARE_PROVIDER_SITE_OTHER): Payer: Medicare HMO

## 2017-02-27 ENCOUNTER — Ambulatory Visit (INDEPENDENT_AMBULATORY_CARE_PROVIDER_SITE_OTHER)
Admission: RE | Admit: 2017-02-27 | Discharge: 2017-02-27 | Disposition: A | Discharge status: Home / Self Care | Payer: Medicare HMO | Source: Ambulatory Visit | Attending: Pulmonary Disease | Admitting: Pulmonary Disease

## 2017-02-27 ENCOUNTER — Encounter: Payer: Self-pay | Admitting: Pulmonary Disease

## 2017-02-27 ENCOUNTER — Ambulatory Visit: Payer: Medicare HMO | Admitting: Pulmonary Disease

## 2017-02-27 VITALS — BP 126/78 | HR 62 | Temp 98.2°F | Ht 73.0 in | Wt 194.0 lb

## 2017-02-27 DIAGNOSIS — I2510 Atherosclerotic heart disease of native coronary artery without angina pectoris: Secondary | ICD-10-CM

## 2017-02-27 DIAGNOSIS — I255 Ischemic cardiomyopathy: Secondary | ICD-10-CM

## 2017-02-27 DIAGNOSIS — G933 Postviral fatigue syndrome: Secondary | ICD-10-CM

## 2017-02-27 DIAGNOSIS — I5022 Chronic systolic (congestive) heart failure: Secondary | ICD-10-CM

## 2017-02-27 DIAGNOSIS — R600 Localized edema: Secondary | ICD-10-CM

## 2017-02-27 DIAGNOSIS — I447 Left bundle-branch block, unspecified: Secondary | ICD-10-CM

## 2017-02-27 DIAGNOSIS — I10 Essential (primary) hypertension: Secondary | ICD-10-CM | POA: Diagnosis not present

## 2017-02-27 DIAGNOSIS — R4189 Other symptoms and signs involving cognitive functions and awareness: Secondary | ICD-10-CM | POA: Insufficient documentation

## 2017-02-27 DIAGNOSIS — G9331 Postviral fatigue syndrome: Secondary | ICD-10-CM

## 2017-02-27 DIAGNOSIS — Z951 Presence of aortocoronary bypass graft: Secondary | ICD-10-CM | POA: Diagnosis not present

## 2017-02-27 DIAGNOSIS — G3184 Mild cognitive impairment, so stated: Secondary | ICD-10-CM | POA: Diagnosis not present

## 2017-02-27 DIAGNOSIS — Z95810 Presence of automatic (implantable) cardiac defibrillator: Secondary | ICD-10-CM

## 2017-02-27 DIAGNOSIS — R05 Cough: Secondary | ICD-10-CM | POA: Diagnosis not present

## 2017-02-27 HISTORY — DX: Mild cognitive impairment of uncertain or unknown etiology: G31.84

## 2017-02-27 LAB — COMPREHENSIVE METABOLIC PANEL
ALT: 14 U/L (ref 0–53)
AST: 26 U/L (ref 0–37)
Albumin: 3.7 g/dL (ref 3.5–5.2)
Alkaline Phosphatase: 100 U/L (ref 39–117)
BILIRUBIN TOTAL: 2.6 mg/dL — AB (ref 0.2–1.2)
BUN: 8 mg/dL (ref 6–23)
CO2: 30 meq/L (ref 19–32)
CREATININE: 0.95 mg/dL (ref 0.40–1.50)
Calcium: 9 mg/dL (ref 8.4–10.5)
Chloride: 105 mEq/L (ref 96–112)
GFR: 80.32 mL/min (ref >60.00)
Glucose, Bld: 106 mg/dL — ABNORMAL HIGH (ref 70–99)
Potassium: 4 mEq/L (ref 3.5–5.1)
SODIUM: 142 meq/L (ref 135–145)
Total Protein: 6 g/dL (ref 6.0–8.3)

## 2017-02-27 LAB — CBC WITH DIFFERENTIAL/PLATELET
BASOS ABS: 0 10*3/uL (ref 0.0–0.1)
Basophils Relative: 0.7 % (ref 0.0–3.0)
EOS ABS: 0.1 10*3/uL (ref 0.0–0.7)
Eosinophils Relative: 1.2 % (ref 0.0–5.0)
HCT: 44.8 % (ref 39.0–52.0)
Hemoglobin: 15.3 g/dL (ref 13.0–17.0)
Lymphocytes Relative: 44.1 % (ref 12.0–46.0)
Lymphs Abs: 2.6 10*3/uL (ref 0.7–4.0)
MCHC: 34.3 g/dL (ref 30.0–36.0)
MCV: 89.1 fl (ref 78.0–100.0)
MONOS PCT: 10 % (ref 3.0–12.0)
Monocytes Absolute: 0.6 10*3/uL (ref 0.1–1.0)
NEUTROS ABS: 2.6 10*3/uL (ref 1.4–7.7)
Neutrophils Relative %: 44 % (ref 43.0–77.0)
PLATELETS: 60 10*3/uL — AB (ref 150.0–400.0)
RBC: 5.03 Mil/uL (ref 4.22–5.81)
RDW: 15.3 % (ref 11.5–15.5)
WBC: 5.9 10*3/uL (ref 4.0–10.5)

## 2017-02-27 LAB — TSH: TSH: 2.68 u[IU]/mL (ref 0.35–4.50)

## 2017-02-27 LAB — BRAIN NATRIURETIC PEPTIDE: PRO B NATRI PEPTIDE: 118 pg/mL — AB (ref 0.0–100.0)

## 2017-02-27 MED ORDER — DONEPEZIL HCL 10 MG PO TABS: ORAL_TABLET | ORAL | 5 refills | Status: DC

## 2017-02-27 MED ORDER — METHYLPREDNISOLONE ACETATE 80 MG/ML IJ SUSP
80.0000 mg | Freq: Once | INTRAMUSCULAR | Status: AC
  Administered 2017-02-27: 80 mg via INTRAMUSCULAR

## 2017-02-27 MED ORDER — METHYLPREDNISOLONE 8 MG PO TABS: ORAL_TABLET | ORAL | 0 refills | Status: DC

## 2017-02-27 NOTE — Progress Notes (Signed)
Subjective:    Patient ID: [name], male    DOB: 05/03/1931, 84 y.o.   MRN: [id number]  HPI 84 y/o WM here for a follow up visit... he has multiple medical problems as noted below...   ~  followed by GI- DrChowdhury/ then [facility] [place]> now followed by DrPyrtle at [facility] w/ sm bowel Crohn's- s/p mult resections w/ complications, prev on 6-MP for prevention of recurrence... also has sl incr fasting bili c/w Gilbert's and developed cirrhosis per DrPyrtle believed to be on the basis of NAFLD...  ~  followed by [name] for Cards- CAD, s/p CABG, Cardiomyopathy w/ AICD- stable... f/u 2DEcho 11/09 showed diffuseHK w/ EF= 35% & DD, mod incr AoV thickness w/o AS... EKG= pacer rhythm.  ~  November 30, 2011:  70moROV & JWille Glaseris c/o a URI/ cold/ sinus infection w/ congestion/ drainage/ beige sput/ etc; we discussed ZPak & Saline for treatment... We reviewed the following medical problems during today's office visit>>     OSA> eval by DrClance 8/13 & started on CPAP but having some interface problems & they are checking download for compliance etc...    HBP> on Coreg6.25Bid, Lisin10, Lasix40-1.5/d, Aldactone25; BP= 116/72 & he denies any CP, palpit, ch in SOB/ edema/ etc...    CAD, Cardiomyopathy> on ASA81 & Coumadin; s/p CABG 1994, [facility] Lasix increased to 646md; last seen by DrKlein 7/13 & ICD functioning normally...    Chol> on Simva20; not fasting today & his last FLP 10/12 showed TChol 124, TG 109, HDL 49, LDL 54    GI- GERD, Polyps> stable & he currently denies abd pain, n/v, c/d, blood seen...    Crohn's dis, Hx SBO> he is followed [place] DrHolmes, [place] and [name] & they have stopped his 6MP; Crohn's has been stable since then 7 he indicated he may start a biologic med in the future...    Hx mesenteric vasc insuffic w/ embolectomy> on Coumadin & monitored in the CC, stable...    BPH> aware- prev checked by  drNesi, PSAs have been wnl...    DJD, PMR> on B12, VitD, Folate; stable & not requiring pain meds or Pred...    Tremor> eval by DrDalphine Handingn the past w/ trial of Clonazepam w/ some improvement...    Anxiety> not currently on Klonopin etc...    [place]> [place], [place]; Plats ranged 67-73K & they stopped his 6MP Rx for crohn's dis... We reviewed prob list, meds, xrays and labs> see below for updates >>   ~  April 18, 2012:  41m71mo[place]oral[place] several complaints/ concerns as listed below>>      C/o an URI w/ head congestion, drainage, etc but denies f/c/s, cough/ sput/ SOB & we discussed OTC meds to use prn...    BP controlled on meds> Coreg6.25Bid, Lisin10, Lasix40-1.5/d, Aldactone25; BP= 110/70 & he denies CP, palpit, SOB, etc...    He is c/o bilat breast swelling, sore & tender; he is on Aldactone25 & this is the likely culprit; we will switch to Eplerenone25...    He remains on Simva20 for his Chol & last FLP was 10/12 w/ TChol 124, TG 109, HDL 49, LDL 54; he needs to ret fasting for f/u labs...    [place] DrHolmes GI [place]/u Crohn's 12/13> stable on  Cholestyramine 4gm/d...    Wife says he's grumpy; offered med rx- SSRI etc but he declines...     He reports a Melanoma removed from left calf area recently w/ some post op swelling; path showed superfic spreading melanoma & it is being managed by DrLupton w/ Bx then wide excision We reviewed prob list, meds, xrays and labs> see below for updates >> he remains on VitD, B12, & Folate...  ~  July 17, 2012:  2moROV & add-on appt at wife's request due to feet swelling L>R w/ some discoloration & pain on left foot- hurts to walk on it x 1wk;  When pressed for explanation [name] mentioned that he did in fact drop a monkey wrench on his left foot ~1wk ago;  Exam shows ven insuff w/ some mild edema on right but left foot has several ecchymoses, 3+ edema, some redness (no broken skin etc) & tender  over 5th metatarsal area (top, side, & bottom);  XRay shows marked sublux vs dislocation at 5th MTP joint, no acute fx, +for osseous demineralization, etc...   REC> refer to Ortho ASAP...    Other medical issues appear stable>  BP=132/78 on his Coreg6.25Bid, Lisinopril10, Lasix40-2Qam, & Eplerenone25 (all breast tenderness has resolved);  He saw DBenay Spice4/14- CAD, cardiomyopathy, some incr edema & Lasix incr to 80Qam;  Stable on Coumadin & his AICD is functioning normally;  Chol looks good on his diet + Simva20;  He [place] f[place] & [facility] to [facility] (likely due to his prev 6MP rx...   ~  November 05, 2012:  3-450moOV & [name] saw Ortho after his last appt- DrHilts felts he had a chr dislocated 5th MTP, used compression wrap & his swelling improved over time;  In the interim he has developed prostate trouble- [facility] but we don't have any records; apparently he had Hematuria work up w/ urine, XRays, cysto- told to have BPH and had ?TURP? (we have requested records to review), he notes that urine stream is better now... We reviewed the following medical problems during today's office visit >>     OSA> eval by DrClance 8/13 & started on CPAP but having some interface problems & he is not currently using the apparatus...    HBP> on Coreg6.25Bid, Lisin10, Lasix40-1.5/d, Inspra25; BP= 140/86 & he denies any CP, palpit, ch in SOB/ edema/ etc...    CAD, Cardiomyopathy> on ASA81 & Coumadin; s/p CABG 1994, AICD placed & last seen by DrKlein 7/14- ICD functioning normally; CAD, s/p CABG w/ one occluded graft, EF=35%, Myoview 10/11 w/ scar, no ischemia; Lasix adjusted to improve periph edema...     Chol> on Simva20; not fasting today & his last FLP 10/12 showed TChol 124, TG 109, HDL 49, LDL 54... Needs to ret for f/u FLP.    GI- GERD, Polyps> stable & he currently denies abd pain, n/v, c/d, blood seen...     Crohn's dis, Hx SBO> he is followed [place] DrHolmes, [place] and [name] & they have stopped his 6MP; Crohn's has been stable since then & he indicated he may start a biologic med in the future...    Hx mesenteric vasc insuffic w/ embolectomy> on Coumadin & monitored in the CC, stable...    BPH> aware- [facility]  [facility]; he reports [facility]S & [place] [facility] & we have requested records...    DJD, PMR> on B12, VitD, Folate; stable & not requiring pain meds or Pred... He has seen DrHilts for left foot pain, VI, chr dislocated 5th MTP, plantar fasciitis...    Tremor> eval by Dalphine Handing in the past w/ trial of Clonazepam w/ some improvement...    Anxiety> not currently on Klonopin etc...    [place] ct> He saw [place] Hematologist [place] on referral by DrHolmes, GI [place]; Plats ranged 67-73K & they stopped his 6MP Rx for crohn's dis & he needs f/u labs... We reviewed prob list, meds, xrays and labs> see below for updates >>  HE NEEDS TO RET FOR f/u FASTING blood work...  ~  March 11, 2013:  41moROV & JWille Glaserhas been staqble> CC is nasal congestion, drainage, stopped up; we doiscussed avail OTC meds to help w/ these symptoms- antihist, saline, fluticasone, and we will write for Astelin as well...  BP controlled on Coreg6.25Bid, Lisin10, Lasix40-1.5/d, Inspra25; BP= 136/82 & he feels ok w/o HA, CP, palpit, SOB, edema, etc...  He saw DrKatz 10/14> f/u cardiomyopathy, on Coumadin w/ hx mesenteric embolus, doing satis, no changes made Chol looks good on Simva20 w/ FLP 2/15 showing TChol 170, TG 175, HDL 61, LDL 74; discussed low fat diet... He reports being stable from the GI standpoint & stopped his Questran; hasn't been back to his GI in awhile...  [place]> hx hematuria & flank pain, BPH w/ obstruction; pt had a TURP in [place] (we do not have op note etc)...  Known  thrombocytopenia, [place] Heme [place] "it's just my pattern" he says...    We reviewed prob list, meds, xrays and labs> see below for updates >> Pt was given Pneumovax & TDAP today...  LABS 2/15:  FLP- Chol ok on Simva20 but TG=175 discussed diet;  Chems- ok x incr fasting bili c/w Gilbert's;  CBC- wnl x low platelets stable at 72K;  TSH=1.59;  Sed=2...  ~  September 09, 2013:  651moOV & [name] has established Primary Care [place]>>    [facility]- PT/INR was 6 on adm, EGD showed abn mucosa in the prepyloric region of the stomach & bx was neg for HPylori, Colon showed mild erythema & edema, few sessile polyps, w/o inflamm changes or bleeding sites seen; blood could have come from the sm bowel based on his hx- Hg nadir was 8.5 & he was transfused 2uPC's;  Hg has improved to 11 range but MCV is still low at 71 & Iron has not been checked- I told him to start OTC Fe daily; he is back on Coumadin per [facility] w/ careful monitoring... He has had f/u w/ AE & sched to see DrPyrtle soon...    He had f/u DrKatz for Cards 8/15> CAD, s/p CABG, ICM and chr sys CHF w/ ICD per DrKlein, meds adjusted- on Coreg6.25Bid, Lisin10, Lasix40Bid, K20, Coumadin via CC...    From the Pulm standpoint he had AR w/ Flonase & Astelin but breathing has been satis- denies cough, sput, hemoptysis, ch in SOB/DOE etc;  He had bronchitic infection several months ago & he tells me he took wife's antibiotics and got better!  He also has hx OSA- prev on CPAP but he prob w/ the interface & hasn't used it in  yrs- resting ok & denies daytime hypersomnolence;   last CXR 3/15 showed cardiomeg, pacer, s/p CABG, azygos lobe, ?underlying COPD- we continue to follow for any resp issues...    We reviewed prob list, meds, xrays and labs> see below for updates >>    ~  February 27, 2017:  3.5year ROV & pulmonary follow up appt requested for upper resp infection, burning in the  chest, weakness, and poor memory according to wife;  He continues to be followed in the Coumadin Clinic, has remote ICD/pacer transmissions Q57m& ICM monitoring- last done 02/20/17 on Lasix40Bid, K20/d, and felt to be stable- no changes made... THEY WERE NOT AWARE THAT Pt HAD DECR LASIX ON HIS OWN TO 491md...    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    He saw DrKlein 11/28/16>  CAD-s/p CABG and chr sys CHF w/ EF=35%, he has ICD implanted for primary prevention; last Echo 11/2014 w/ EF=35-40%; EKG shows AV pacing...    He saw PCP office DrWendling 01/19/17> c/o 3d hx sore throat, Temp101, cough; Chest was clear- he was treated for bronchitis w ZPak, fluids, Mucinex;  We reviewed the following medical problems during today's office visit >>     OSA> eval by DrClance 8/13 & started on CPAP but having some interface problems & he is still not currently using the apparatus!    HBP> on Coreg6.25Bid, Lisin10, Lasix40/d, K20; BP= 126/78 & he denies any CP, palpit, ch in SOB/ edema/ etc...    CAD, Cardiomyopathy> on Coumadin via CC; s/p CABG 1994, AICD placed & lfollowed by DrKlein- ICD functioning normally; CAD, s/p CABG w/ one occluded graft, EF=35%, Myoview 10/11 w/ scar, no ischemia; Lasix adjusted to improve periph edema...     Chol> on Simva20; his last FLP 7/18 showed TChol 131, TG 106, HDL 52, LDL 58...    GI- hx GIB, GERD, Polyps> stable on Protonix40 & he currently denies abd pain, n/v, c/d, blood seen...    Crohn's dis, Hx SBO> [place] and [name] & they have stopped his  prev 6MP rx; now followed by DrPyrtle- Crohn's has been stable...    Hx mesenteric vasc insuffic w/ embolectomy> on Coumadin & monitored in the CC, stable...    BPH> aware- [facility]; [facility]...    DJD, PMR> on B12, VitD, Folate; stable & not requiring pain meds or Pred... He has seen DrHilts for left foot pain, VI, chr dislocated 5th MTP, plantar fasciitis...    Tremor> eval by DrDalphine Handingn the past w/ trial of Clonazepam w/ some improvement...    Anxiety> not currently on Klonopin etc...    [place]> [place]; Plats ranged 67-73K & they stopped his 6MP Rx for crohn's dis; Plats remain 50-90K... EXAM shows Afeb, VSS, O2sat=93% on RA;  HEENT- neg, mallampati2;  Chest- decr BS but clear w/o w/r/r;  Heart- RR gr1/6 SEM w/o r/g;  Abd- soft, nontender, neg;  Ext- VI, +edema, w/o c/c;  Neuro- sl slow, intact, nonfocal...  CXR 02/27/17 (independently reviewed by me in the PACS system) showed borderline heart size, aortic atherosclerosis, AICD on left, and prior CABG; lungs are clear x mild atx at bases, NAD...   LABS 02/27/17>  Chems- wnl x BS=106, Bili=2.6, other LFTs were wnl.  CBC- wnl w/ Hg=15.3 but PLAT=60K;  TSH=2.68;  BNP=118 IMP/PLAN>>  We discussed treating him w/ Depo80 & Medrol 18m tabs- 5d tapering interval (Bid, Qd, 1/2 daily, then Qod til return);  He is asked to incr the LASIX40 to Bid for 5d then back to one Qam;  We will recheck in 4-6wks;  JKennyth Losewanted him to start something for his memory- given Aricept164m1/2 tab Qd...          Problem List:  OSA >> He had a sleep study w/ AHI=34- loud snoring & abnormal breathing pattern noted by wife; pt notes freq awakenings & not feeling rested in the AM; but he denied issues w/ daytime sleepiness & his ESS= only1... Started on CPAP & they are following up... ~  He reports mask/ interface  problems and he is not using the CPAP; asked to check w/ DME & f/u w/ DrClance...  AR/ AB >> From the Pulm standpoint he had AR w/ Flonase & Astelin but breathing has been satis- denies cough, sput, hemoptysis, ch in SOB/DOE etc;  He had bronchitic infection several months ago & he tells me he took wife's antibiotics and got better!  He also has hx OSA- prev on CPAP but he prob w/ the interface & hasn't used it in yrs- resting ok & denies daytime hypersomnolence;   last CXR 3/15 showed cardiomeg, pacer, s/p CABG, azygos lobe, ?underlying COPD- we continue to follow for any resp issues...   CARDS followed by DrBenay Spice DrKlein >>  HBP, CAD, Cardiomyopathy >> followed by DrBenay Spicend DrKlein... he had CABG in 1994 (with cath in 96 revealing a total right and an 80% OM, vein graft to the diagonal was occluded, other bypasses were patent), Biv AICD in 2006...  On COUMADIN (follow in clinic), ASA 8148m,  COREG 6.80m75m,  LISINOPRIL 20mg52m LASIX 40mg/31mSPIRONOLACTONE 80mg/d80m~  Cardiolite 3/03 w/ EF=37%...  ~  Cath 6/06 w/ native vessel and graft disease...  ~  2DEcho 11/09 stable w/ mod LVD- diffuse HK w/ EF= 35%, DD, mod incr AoV thickness w/o AS...  ~  3/11:  CXR showed stable CABG, AICD, & chr changes... ~  10/11:  Myoview showed extensive scar, no ischemia, EF= 34% ~  10/11:  New AICD placed by DrKlein... ~  11/11:  2DEcho showed mod reduced LV sys function w/ EF= 30-35%, diffuse HK, gr1 DD> see report... ~  10/12:  BP= 126/68 & stable from the CV perspective... ~  4/13:  He's had f/u w/ Cards> DrKatz et al; notes reviewed, no med changes other than Coumadin regulation... ~  7/13:  His AICD was interrogated by DrKlein & no problems identified... ~  8/13:  2DEcho showed mild LVH, mod decr LVF w/ EF=35-40% w/ HK of the entire myocardium, Gr1DD, mild AoV sclerosis, mild MR, normal RV... ~  8/13:  VenDopplers of LEs ordered by Cards- neg for DVT ~  10/13:  He had f/u DrKatz who noted edema is  diminished on the Lasix60... ~  10/13: on Coreg6.25Bid, Lisin10, Lasix40-1.5/d, Aldactone25; BP= 116/72 & he  denies any CP, palpit, ch in SOB/ edema/ etc... ~  3/14:  BP controlled on meds> Coreg6.25Bid, Lisin10, Lasix40-1.5/d, Aldactone25; BP= 110/70 & he denies CP, palpit, SOB, etc; but he has developed gynecomastia from the aldactone & we will switch to Eplerenone25... ~  6/14:  BP=132/78 on his Coreg6.25Bid, Lisinopril10, Lasix40-2Qam, & Eplerenone25 (all breast tenderness has resolved);  He saw [name] 4/14- CAD, cardiomyopathy, some incr edema & Lasix incr to 80Qam... ~  10/14: on Coreg6.25Bid, Lisin10, Lasix40-1.5/d, Inspra25; BP= 140/86 & he denies any CP, palpit, ch in SOB/ edema/ etc; Also on ASA81 & Coumadin; s/p CABG 1994, AICD placed & last seen by DrKlein 7/14- ICD functioning normally; CAD, s/p CABG w/ one occluded graft, EF=35%, Myoview 10/11 w/ scar, no ischemia; Lasix adjusted to improve periph edema ~  2/15: BP controlled on Coreg6.25Bid, Lisin10, Lasix40-1.5/d, Inspra25; BP= 136/82 & he feels ok w/o HA, CP, palpit, SOB, edema, etc; He saw DrKatz 10/14> f/u cardiomyopathy, on Coumadin w/ hx mesenteric embolus, doing satis, no changes made.  Primary Care per DrBlythe >>  HYPERLIPIDEMIA - he takes SIMVASTATIN 44m/d... he's had min, non-progressive incr LFTs noted. ~  FR[place]4/08 showed TChol 138, TG 138, HDL 38, LDL 72... tolerating med well. ~  FPar[place]4/09 showed TChol 118, TG 111, HDL 33, LDL 63 ~  FLP 12/09 showed TChol 118, TG 100, HDL 31, LDL 67 ~  FLP 10/10 on Simva20 showed TChol 111, TG 139, HDL 36, LDL 47 ~  FLP 10/11 on Simva20 showed TChol 101, TG 102, HDL 35, LDL 45 ~  FLP 10/12 on Simva20 showed TChol 124, TG 109, HDL 49, LDL 54 ~  FLP 10/13 & 3/14> pt wasn't fasting for blood work today... ~  FAleutians [place]2/15 on Simva20 showed TChol 170, TG 175, HDL 61, LDL 74  GI per DrPyrtle >>  GERD (ICD-530.81) - no active symptoms and not currently taking PPI etc...  COLONIC POLYPS  (ICD-211.3) - last colonoscopy here was 7/04 by DDemetra Shinerw/ divertics and several polyps from 6 - 260msize, reportedly adenomatous on bx but I cannot find the path reports... he is overdue for f/u colon... ~  labs 2/10 showed Hg= 15.7...Marland KitchenMarland Kitchene will send copy of note to DrChowdhury w/ request for f/u colonoscopy. ~  f/u colonoscopy 11/10 by DrChowdhury showed norm TI & several polyps removed- path +tubular adenoma...  CROHN'S DISEASE (ICD-555.9) - he is [place]Chowdhury [place] 6-MP 5058mabs- 1.5 tabs/d... he notes intermittent diarrhea and was started on Questran Prn... ~  labs 3/11 noted sl incr LFTs, Protime, & Sed... ~  labs 10/11 showed norm chems & LFTs x incr bili, Plat=88K, BNP=219, otherw norm. ~  Labs 10/12 showed norm chems, SGOT=59, TBili=3.9, Plat=85K... ~  [facility]> off 6MP due to thrombocytopenia & doing satis, considering a biologic if needed, considering repeat colonoscopy... ~  [facility] from [facility]/13> doing satis on Cholestyramine4gm daily, no changes made... ~  2/15: He reports being stable from the GI standpoint & stopped his Questran; hasn't been back to his GI in awhile...   Hx of ACUTE VASCULAR INSUFFICIENCY OF INTESTINE (ICD-557.0) - he has embolectomy of the superior mesenteric artery 1/03 by DrLSheryn Bisonong hospitalization w/ complications)... he has remained on COUMADIN in the coumadin clinic ever since then...  VENTRAL HERNIA (ICD-553.20) - he is s/p left inguinal hernia repair 6/08 by DrIClarita Lebernd had a prev incarcerated ventral hernia repaired 11/03... his residual/ recurrent ventral hernia has been evaluated  by Clarita Leber who feels that he would need eval in [place] if this hernia comes to surg... currently he wears an abd binder when up and about...  SBO >> Hosp 3/14 - 04/19/11 by [facility] w/ SBO that opened up w/o surgical intervention... ~  CT Abd 3/13 showed early SBO pattern from an anterior wall hernia defect; ?GE varicies noted, s/p GB,  hepatic steatosis, calcif granulomas in liver, cyst in right kidney, atherosclerotic ulcer in infrarenal Ao, ectasia of iliacs...  Urology in [place] >>  HYPERTROPHY PROSTATE W/UR OBST & OTH LUTS (ICD-600.01) - saw DrNesi 7/10 w/ BPH, obstructive symptoms, and ED- given  Flomax & "shots"...  ~  Labs 10/12 showed PSA= 2.93 ~  10/14: he tells me he had Prostate surg by [facility] in [place], we do not have records & have requested these... ~  2/15: He had Urology eval in [place], DrHanspal> hx hematuria & flank pain, BPH w/ obstruction; pt had a TURP in [place] (we do not have op note etc)...   DEGENERATIVE JOINT DISEASE (ICD-715.90) >>  ~  6/14:  He presented w/ swelling in the feet, but L>R w/ pain & tender laterally; then he admitted to dropping a monkey wrench on his foot 1 week ago; XRay showed subluxed/ dislocated 5th MTP=> to Ortho ASAP...  Hx of POLYMYALGIA RHEUMATICA (ICD-725) - hasn't req steroids x yrs... ~  labs 2/10 showed Sed= 8 ~  labs 10/10 showed Sed= 6 ~  labs 3/11 showed Sed= 51 w/ acute illness. ~  labs 10/11 showed Sed= 9 ~  Labs 10/12 showed Sed= 9  TREMOR (ICD-781.0) - [facility] 3[facility]10 [facility]- essential tremor w/ +FamHx in sister... trial Clonazepam 0.66m Bid w/ improvement...  ANXIETY (ICD-300.00) - he uses the same Clonazepam 0.272mPrn...  Hx of SHINGLES (ICD-053.9)  Thrombocytopenia >> serial labs w/ Plat counts betw 223 ==> 85; no bleeding diathesis, & he had acute intestinal vasc insuffic 2003 requiring embolectomy & has been on Coumadin Rx ever since then... ~  [place], [place]; Plats ranged 67-73K & they stopped his 6MP Rx for crohn's dis & he needs f/u labs.  [facility] >> [facility]/ [facility] & Hg= 8.5 assoc w/ PT/ INR ~6 ~  Follow up labs & Rx per GI & Primary Care...   VITAMIN B12 DEFICIENCY (ICD-266.2) - on B 12 100024md OTC... ~  labs 2/10 showed B12 level = 215... rec start oral  B12 supplement daily. ~  labs 10/10 showed B12 level = 416... continue oral B12 supplement. ~  labs 10/11 showed B12= 592... continue same. ~  Labs 10/12 showed B12= 573  VITAMIN D DEFICIENCY (ICD-268.9) - on Vit D 1000 u daily OTC... ~  labs 2/10 showed Vit D level = 23... rec> start Vit D 1000 u daily.. ~  Labs 10/12 showed Vit D level = 50   Past Surgical History:  Procedure Laterality Date  . CATARACT EXTRACTION, BILATERAL  2014  . COLONOSCOPY N/A 04/19/2013   Procedure: COLONOSCOPY;  Surgeon: JayJerene BearsD;  [facility];  Service: Endoscopy;  Laterality: N/A;  . CORONARY ARTERY BYPASS GRAFT  1994  . Elap w/ superior mesenteric artery embolectomy  1/03   by Dr. LawJennette Banker ESOPHAGOGASTRODUODENOSCOPY N/A 04/18/2013   Procedure: ESOPHAGOGASTRODUODENOSCOPY (EGD);  Surgeon: JayJerene BearsD;  [facility];  Service: Endoscopy;  Laterality: N/A;  . implantation of biventric cardioverter-defibrillatorr  by Dr. Lovena Le in 8/06 Guidant Contak H170-explanted 2011  . implantation of guidant cognis device     model [id number]  . laparoscopic cholecystectomy  2002  . left inguinal hernia repair with mesh  6/08   by Dr. Betsy Pries  . PROSTATE SURGERY  08/2012   by urology in [place]  . Repair of incarcerated ventral hernia and lysis of adhesions  11/03   by Dr. Betsy Pries    Outpatient Encounter Medications as of 02/27/2017  Medication Sig  . carvedilol (COREG) 6.25 MG tablet Take 1 tablet (6.25 mg total) by mouth 2 (two) times daily with a meal.  . cholecalciferol (VITAMIN D) 1000 UNITS tablet Take 1,000 Units by mouth daily.   . furosemide (LASIX) 40 MG tablet Take 1 tablet (40 mg total) by mouth 2 (two) times daily.  [name] KLOR-CON M20 20 MEQ tablet TAKE ONE TABLET BY MOUTH ONCE DAILY  . lisinopril (PRINIVIL,ZESTRIL) 10 MG tablet Take 1 tablet (10 mg total) by mouth daily.  . pantoprazole (PROTONIX) 40 MG tablet TAKE 1 TABLET BY MOUTH  EVERY DAY AT 12 NOON  . simvastatin  (ZOCOR) 20 MG tablet Take 1 tablet (20 mg total) by mouth at bedtime.  . vitamin B-12 (CYANOCOBALAMIN) 1000 MCG tablet Take 1,000 mcg by mouth every other day.  . warfarin (COUMADIN) 5 MG tablet Take as directed by coumadin clinic  . donepezil (ARICEPT) 10 MG tablet Take 1/2 tablet at bedtime daily  . methylPREDNISolone (MEDROL) 8 MG tablet Starting 1/29 take one tab twice daily for 5 days....then decrease to one tab each AM for 5 days....then decrease to 1/2 tablet daily in the AM for 5 days....then decrease to 1/2 tab every other day til gone.  [name] tiZANidine (ZANAFLEX) 2 MG tablet Take 1 tablet (2 mg total) by mouth at bedtime as needed for muscle spasms. (Patient not taking: Reported on 02/27/2017)  . [DISCONTINUED] amoxicillin-clavulanate (AUGMENTIN) 875-125 MG tablet Take 1 tablet by mouth 2 (two) times daily. (Patient not taking: Reported on 02/27/2017)  . [EXPIRED] methylPREDNISolone acetate (DEPO-MEDROL) injection 80 mg    No facility-administered encounter medications on file as of 02/27/2017.     No Known Allergies    Immunization History  Administered Date(s) Administered  . Influenza Split 11/24/2010, 11/30/2011, 10/31/2012  . Influenza Whole 11/06/2008  . Influenza, High Dose Seasonal PF 11/10/2015, 12/01/2016  . Influenza,inj,Quad PF,6+ Mos 12/30/2013, 12/09/2014  . Pneumococcal Conjugate-13 12/09/2014  . Pneumococcal Polysaccharide-23 03/11/2013  . Tdap 03/11/2013    Current Medications, Allergies, Past Medical History, Past Surgical History, Family History, and Social History were reviewed in [employer] record.    Review of Systems         See HPI - all other systems neg except as noted... The patient complains of decreased hearing and dyspnea on exertion.  The patient denies anorexia, fever, weight loss, weight gain, vision loss, hoarseness, chest pain, syncope, peripheral edema, prolonged cough, headaches, hemoptysis, abdominal pain, melena,  hematochezia, severe indigestion/heartburn, hematuria, incontinence, muscle weakness, suspicious skin lesions, transient blindness, difficulty walking, depression, unusual weight change, abnormal bleeding, enlarged lymph nodes, and angioedema.     Objective:   Physical Exam     WD, WN, 84 y/o WM in NAD... GENERAL:  Alert & oriented; pleasant & cooperative... HEENT:  [place]/AT, EOM-wnl, PERRLA, EACs-clear, TMs-wnl, NOSE- sl red/ inflamm, THROAT-clear & wnl. NECK:  Supple w/ fairROM; no JVD; normal carotid impulses w/o bruits; no thyromegaly or nodules palpated; no lymphadenopathy. CHEST:  Decr BS  w/ few scat rhonchi, no wheezing, no rales, no signs of consolidation. HEART:  Regular Rhythm; gr 1/6 SEM without rubs or gallops detected... ABDOMEN:  Soft & nontender; mult scars, & ventral hernia; normal bowel sounds; no organomegaly or masses palpated. EXT: without deformities, mod arthritic changes; no varicose veins/ venous insuffic/ or edema. NEURO:  CN's intact; I do not apprec a tremor at present; no focal neuro deficits... DERM:  No lesions noted; no rash etc...  RADIOLOGY DATA:  Reviewed in the EPIC EMR & discussed w/ the patient...  LABORATORY DATA:  Reviewed in the EPIC EMR & discussed w/ the patient...   Assessment & Plan:    AR/ AB/ OSA >>  02/27/17 -- We discussed treating him w/ Depo80 & Medrol 28m tabs- 5d tapering interval (Bid, Qd, 1/2 daily, then Qod til return);  He is asked to incr the LASIX40 to Bid for 5d then back to one Qam;  We will recheck in 4-6wks;  JKennyth Losewanted him to start something for his memory- given Aricept147m1/2 tab Qd...   HBP>  BP controlled, continue same meds per Cards and Primary Care...  CAD/ Cardiomyopathy>  Followed by DrBenay Spice DrKlein, their notes are reviewed...  Hyperlipid>  On Simva20 + diet> continue same...  GI>  Followed by DrPyrtle for [facility] now...  Hx vasc insuffic intestines>  SMA embolectomy 2003, he remains on Coumadin via the  CC...  Hx SBO requiring Hosp 3/13 but resolved w/ conserv approach per DrHIngram, CCS...  Hx UGIB 3/15> assoc w/ PT/INR~6, Hg nadir ~8, tx 2u, ?source- EGD/Colon per DrPyrtle, bleeding stopped spont  Left FOOT PAIN => dislocated left 5th MTP joint; seen by DrHilts who felt this was chronic, treated edema w/ compression hose & swelling is down...  Other medical problems as noted...   Patient's Medications  New Prescriptions   DONEPEZIL (ARICEPT) 10 MG TABLET    Take 1/2 tablet at bedtime daily   METHYLPREDNISOLONE (MEDROL) 8 MG TABLET    Starting 1/29 take one tab twice daily for 5 days....then decrease to one tab each AM for 5 days....then decrease to 1/2 tablet daily in the AM for 5 days....then decrease to 1/2 tab every other day til gone.  Previous Medications   CARVEDILOL (COREG) 6.25 MG TABLET    Take 1 tablet (6.25 mg total) by mouth 2 (two) times daily with a meal.   CHOLECALCIFEROL (VITAMIN D) 1000 UNITS TABLET    Take 1,000 Units by mouth daily.    FUROSEMIDE (LASIX) 40 MG TABLET    Take 1 tablet (40 mg total) by mouth 2 (two) times daily.   KLOR-CON M20 20 MEQ TABLET    TAKE ONE TABLET BY MOUTH ONCE DAILY   LISINOPRIL (PRINIVIL,ZESTRIL) 10 MG TABLET    Take 1 tablet (10 mg total) by mouth daily.   PANTOPRAZOLE (PROTONIX) 40 MG TABLET    TAKE 1 TABLET BY MOUTH  EVERY DAY AT 12 NOON   SIMVASTATIN (ZOCOR) 20 MG TABLET    Take 1 tablet (20 mg total) by mouth at bedtime.   TIZANIDINE (ZANAFLEX) 2 MG TABLET    Take 1 tablet (2 mg total) by mouth at bedtime as needed for muscle spasms.   VITAMIN B-12 (CYANOCOBALAMIN) 1000 MCG TABLET    Take 1,000 mcg by mouth every other day.   WARFARIN (COUMADIN) 5 MG TABLET    Take as directed by coumadin clinic  Modified Medications   No medications on file  Discontinued Medications  AMOXICILLIN-CLAVULANATE (AUGMENTIN) 875-125 MG TABLET    Take 1 tablet by mouth 2 (two) times daily.

## 2017-02-27 NOTE — Patient Instructions (Signed)
Today we updated your med list in our EPIC system...    Continue your current medications the same...  Today we checked a follow up CXR & some blood work...    We will contact you w/ the results when available...   We gave you a Depo shot today... Starting in the AM tomorrow 1/29-- take the MEDROL 57m tabs as follows>    Starting in AM 1/29 take one tab twice daily for 5 days...    Then decrease to one tab each AM for 5 days...    Then decrease to 1/2 tab daily in the AM for 5 days...    Then decrease to !/2 tab every other day til gone (1/2, 0, 1/2, 0, etc)...  We also wrote a new Rx for Aricept 132m- start w/ 1/2 tab daily in the evening...  Remember-- NO SALT/ sodium, keep legs elevated, wear support hose...  Call for any questions...  Let's plan a follow up visit in 4-6weeks, sooner if needed for problems...Marland KitchenMarland Kitchen

## 2017-02-28 ENCOUNTER — Telehealth: Payer: Self-pay | Admitting: Pulmonary Disease

## 2017-02-28 NOTE — Telephone Encounter (Signed)
Noralee Space, MD sent to Eula Listen, RN        Please notify patient>   CXR shows borderline heart size & atherosclerotic changes; AICD device in good position; essentially clear lungs w/ some mild basilar atx- NAD...    lmtcb

## 2017-03-01 ENCOUNTER — Other Ambulatory Visit: Payer: Self-pay | Admitting: Dermatology

## 2017-03-01 DIAGNOSIS — D225 Melanocytic nevi of trunk: Secondary | ICD-10-CM | POA: Diagnosis not present

## 2017-03-01 DIAGNOSIS — Z85820 Personal history of malignant melanoma of skin: Secondary | ICD-10-CM | POA: Diagnosis not present

## 2017-03-01 DIAGNOSIS — C44622 Squamous cell carcinoma of skin of right upper limb, including shoulder: Secondary | ICD-10-CM | POA: Diagnosis not present

## 2017-03-01 DIAGNOSIS — L814 Other melanin hyperpigmentation: Secondary | ICD-10-CM | POA: Diagnosis not present

## 2017-03-01 DIAGNOSIS — L821 Other seborrheic keratosis: Secondary | ICD-10-CM | POA: Diagnosis not present

## 2017-03-02 NOTE — Telephone Encounter (Signed)
Called and spoke with pt's wife, Kennyth Lose letting her know the results of pt's cxr.  Kennyth Lose is wanting to know if Dr. Lenna Gilford can all her at his convenience to discuss the results with her further.  Kennyth Lose can be reached at [phone]

## 2017-03-03 NOTE — Telephone Encounter (Signed)
Per SN:  SN spoke to patient and answered all questions. Nothing further needed.

## 2017-03-08 ENCOUNTER — Ambulatory Visit (INDEPENDENT_AMBULATORY_CARE_PROVIDER_SITE_OTHER): Payer: Medicare HMO | Admitting: *Deleted

## 2017-03-08 DIAGNOSIS — Z0000 Encounter for general adult medical examination without abnormal findings: Secondary | ICD-10-CM

## 2017-03-08 DIAGNOSIS — Z5181 Encounter for therapeutic drug level monitoring: Secondary | ICD-10-CM | POA: Diagnosis not present

## 2017-03-08 DIAGNOSIS — K55069 Acute infarction of intestine, part and extent unspecified: Secondary | ICD-10-CM | POA: Diagnosis not present

## 2017-03-08 LAB — POCT INR: INR: 2.6

## 2017-03-08 NOTE — Patient Instructions (Signed)
Description   Continue on same dosage 32m daily.  Recheck INR in 6 weeks. Call our office if you have any concerns or are placed on any new medications [phone]

## 2017-03-10 ENCOUNTER — Telehealth: Payer: Self-pay | Admitting: *Deleted

## 2017-03-10 NOTE — Telephone Encounter (Signed)
Received Dermatopathology Report results from [facility]; forwarded to provider/SLS 02/08

## 2017-03-15 ENCOUNTER — Telehealth: Payer: Self-pay

## 2017-03-15 NOTE — Telephone Encounter (Signed)
Left message for pt to call back regarding scheduling [place] of ABD.

## 2017-03-23 ENCOUNTER — Ambulatory Visit (INDEPENDENT_AMBULATORY_CARE_PROVIDER_SITE_OTHER): Payer: Medicare HMO

## 2017-03-23 DIAGNOSIS — Z95810 Presence of automatic (implantable) cardiac defibrillator: Secondary | ICD-10-CM | POA: Diagnosis not present

## 2017-03-23 DIAGNOSIS — I5022 Chronic systolic (congestive) heart failure: Secondary | ICD-10-CM

## 2017-03-24 ENCOUNTER — Telehealth: Payer: Self-pay

## 2017-03-24 NOTE — Telephone Encounter (Signed)
Remote ICM transmission received.  Attempted call to patient and no answer

## 2017-03-24 NOTE — Progress Notes (Signed)
EPIC Encounter for ICM Monitoring  Patient Name: [name] is a 84 y.o. male Date: 03/24/2017 Primary Care Physican: [name], MD Primary Cardiologist:Nelson Electrophysiologist: Faustino Congress Weight:186 lbs Right Ventricular Pacing 96% and Left Ventricular Pacing 98%                                                                      Attempted call to patient and unable to reach.   Transmission reviewed.           Prescribed dosage: Furosemide 40 mg 1 tablet twice a day. Potassium 20 mEq 1 tablet daily  Recommendations: NONE - Unable to reach.  Follow-up plan: ICM clinic phone appointment on 04/24/2017.          Copy of ICM check sent to Dr. Caryl Comes.  3 Month Trend 03/19/2017    8 Day Data Trend            Rosalene Billings, RN 03/24/2017 2:59 PM

## 2017-03-28 ENCOUNTER — Other Ambulatory Visit: Payer: Self-pay

## 2017-03-28 ENCOUNTER — Encounter: Payer: Self-pay | Admitting: Pulmonary Disease

## 2017-03-28 ENCOUNTER — Ambulatory Visit: Payer: Medicare HMO | Admitting: Pulmonary Disease

## 2017-03-28 VITALS — BP 124/74 | HR 71 | Temp 97.6°F | Ht 73.0 in | Wt 190.6 lb

## 2017-03-28 DIAGNOSIS — G3184 Mild cognitive impairment, so stated: Secondary | ICD-10-CM

## 2017-03-28 DIAGNOSIS — I255 Ischemic cardiomyopathy: Secondary | ICD-10-CM

## 2017-03-28 DIAGNOSIS — Z951 Presence of aortocoronary bypass graft: Secondary | ICD-10-CM

## 2017-03-28 DIAGNOSIS — I447 Left bundle-branch block, unspecified: Secondary | ICD-10-CM

## 2017-03-28 DIAGNOSIS — I5022 Chronic systolic (congestive) heart failure: Secondary | ICD-10-CM

## 2017-03-28 DIAGNOSIS — I10 Essential (primary) hypertension: Secondary | ICD-10-CM

## 2017-03-28 DIAGNOSIS — R600 Localized edema: Secondary | ICD-10-CM

## 2017-03-28 DIAGNOSIS — K7460 Unspecified cirrhosis of liver: Secondary | ICD-10-CM

## 2017-03-28 DIAGNOSIS — I2510 Atherosclerotic heart disease of native coronary artery without angina pectoris: Secondary | ICD-10-CM

## 2017-03-28 DIAGNOSIS — Z95810 Presence of automatic (implantable) cardiac defibrillator: Secondary | ICD-10-CM

## 2017-03-28 MED ORDER — DONEPEZIL HCL 10 MG PO TABS: 10.0000 mg | ORAL_TABLET | Freq: Every day | ORAL | 3 refills | Status: DC

## 2017-03-28 NOTE — Telephone Encounter (Signed)
Pt scheduled for [place] of ABD at [facility] 03/31/17@9am , pt to arrive there at 8:45am. Pt to be NPO after midnight. Pts wife aware of appt.

## 2017-03-28 NOTE — Patient Instructions (Signed)
Today we updated your med list in our EPIC system...    Continue your current medications the same...  We refilled your Aricept (Donepizil) 32m tabs - take one tab daily...  Finish out the Medrol as discussed...  Call for any questions or if we can be of service in any way..Marland KitchenMarland Kitchen

## 2017-03-29 ENCOUNTER — Encounter: Payer: Self-pay | Admitting: Pulmonary Disease

## 2017-03-29 NOTE — Progress Notes (Signed)
Subjective:    Patient ID: [name], male    DOB: Sep 25, 1931, 84 y.o.   MRN: [id number]  HPI 84 y/o WM here for a follow up visit... he has multiple medical problems as noted below...   ~  followed by GI- DrChowdhury/ then [facility] [place]> now followed by DrPyrtle at [facility] w/ sm bowel Crohn's- s/p mult resections w/ complications, prev on 6-MP for prevention of recurrence... also has sl incr fasting bili c/w Gilbert's and developed cirrhosis per DrPyrtle believed to be on the basis of NAFLD...  ~  followed by [name] for Cards- CAD, s/p CABG, Cardiomyopathy w/ AICD- stable... f/u 2DEcho 11/09 showed diffuseHK w/ EF= 35% & DD, mod incr AoV thickness w/o AS... EKG= pacer rhythm.  ~  November 30, 2011:  10moROV & JWille Glaseris c/o a URI/ cold/ sinus infection w/ congestion/ drainage/ beige sput/ etc; we discussed ZPak & Saline for treatment... We reviewed the following medical problems during today's office visit>>     OSA> eval by DrClance 8/13 & started on CPAP but having some interface problems & they are checking download for compliance etc...    HBP> on Coreg6.25Bid, Lisin10, Lasix40-1.5/d, Aldactone25; BP= 116/72 & he denies any CP, palpit, ch in SOB/ edema/ etc...    CAD, Cardiomyopathy> on ASA81 & Coumadin; s/p CABG 1994, [facility] Lasix increased to 648md; last seen by DrKlein 7/13 & ICD functioning normally...    Chol> on Simva20; not fasting today & his last FLP 10/12 showed TChol 124, TG 109, HDL 49, LDL 54    GI- GERD, Polyps> stable & he currently denies abd pain, n/v, c/d, blood seen...    Crohn's dis, Hx SBO> he is [place]Holmes, [place] and [name] & they have stopped his 6MP; Crohn's has been stable since then 7 he indicated he may start a biologic med in the future...    Hx mesenteric vasc insuffic w/ embolectomy> on Coumadin & monitored in the CC, stable...    BPH> aware- prev checked by  drNesi, PSAs have been wnl...    DJD, PMR> on B12, VitD, Folate; stable & not requiring pain meds or Pred...    Tremor> eval by DrDalphine Handingn the past w/ trial of Clonazepam w/ some improvement...    Anxiety> not currently on Klonopin etc...    Low [place] ct> [place], [place]; Plats ranged 67-73K & they stopped his 6MP Rx for crohn's dis... We reviewed prob list, meds, xrays and labs> see below for updates >>   ~  April 18, 2012:  2[place]31moVB[place]villes several complaints/ concerns as listed below>>      C/o an URI w/ head congestion, drainage, etc but denies f/c/s, cough/ sput/ SOB & we discussed OTC meds to use prn...    BP controlled on meds> Coreg6.25Bid, Lisin10, Lasix40-1.5/d, Aldactone25; BP= 110/70 & he denies CP, palpit, SOB, etc...    He is c/o bilat breast swelling, sore & tender; he is on Aldactone25 & this is the likely culprit; we will switch to Eplerenone25...    He remains on Simva20 for his Chol & last FLP was 10/12 w/ TChol 124, TG 109, HDL 49, LDL 54; he needs to ret fasting for f/u labs...    He [place] DrHolmes GI [place]/u Crohn's 12/13> stable on  Cholestyramine 4gm/d...    Wife says he's grumpy; offered med rx- SSRI etc but he declines...     He reports a Melanoma removed from left calf area recently w/ some post op swelling; path showed superfic spreading melanoma & it is being managed by DrLupton w/ Bx then wide excision We reviewed prob list, meds, xrays and labs> see below for updates >> he remains on VitD, B12, & Folate...  ~  July 17, 2012:  2moROV & add-on appt at wife's request due to feet swelling L>R w/ some discoloration & pain on left foot- hurts to walk on it x 1wk;  When pressed for explanation [name] mentioned that he did in fact drop a monkey wrench on his left foot ~1wk ago;  Exam shows ven insuff w/ some mild edema on right but left foot has several ecchymoses, 3+ edema, some redness (no broken skin etc) & tender  over 5th metatarsal area (top, side, & bottom);  XRay shows marked sublux vs dislocation at 5th MTP joint, no acute fx, +for osseous demineralization, etc...   REC> refer to Ortho ASAP...    Other medical issues appear stable>  BP=132/78 on his Coreg6.25Bid, Lisinopril10, Lasix40-2Qam, & Eplerenone25 (all breast tenderness has resolved);  He saw DBenay Spice4/14- CAD, cardiomyopathy, some incr edema & Lasix incr to 80Qam;  Stable on Coumadin & his AICD is functioning normally;  Chol looks good on his diet + Simva20;  He [place] & [facility] to [facility] (likely due to his prev 6MP rx...   ~  November 05, 2012:  3-450moOV & [name] saw Ortho after his last appt- DrHilts felts he had a chr dislocated 5th MTP, used compression wrap & his swelling improved over time;  In the interim he has developed prostate trouble- [facility] [facility] but we don't have any records; apparently he had Hematuria work up w/ urine, XRays, cysto- told to have BPH and had ?TURP? (we have requested records to review), he notes that urine stream is better now... We reviewed the following medical problems during today's office visit >>     OSA> eval by DrClance 8/13 & started on CPAP but having some interface problems & he is not currently using the apparatus...    HBP> on Coreg6.25Bid, Lisin10, Lasix40-1.5/d, Inspra25; BP= 140/86 & he denies any CP, palpit, ch in SOB/ edema/ etc...    CAD, Cardiomyopathy> on ASA81 & Coumadin; s/p CABG 1994, AICD placed & last seen by DrKlein 7/14- ICD functioning normally; CAD, s/p CABG w/ one occluded graft, EF=35%, Myoview 10/11 w/ scar, no ischemia; Lasix adjusted to improve periph edema...     Chol> on Simva20; not fasting today & his last FLP 10/12 showed TChol 124, TG 109, HDL 49, LDL 54... Needs to ret for f/u FLP.    GI- GERD, Polyps> stable & he currently denies abd pain, n/v, c/d, blood seen...     Crohn's dis, Hx SBO> he is followed [place] DrHolmes, [place] and [name] & they have stopped his 6MP; Crohn's has been stable since then & he indicated he may start a biologic med in the future...    Hx mesenteric vasc insuffic w/ embolectomy> on Coumadin & monitored in the CC, stable...    BPH> aware- [facility]  [facility]; he reports [facility] [place] [facility] & we have requested records...    DJD, PMR> on B12, VitD, Folate; stable & not requiring pain meds or Pred... He has seen DrHilts for left foot pain, VI, chr dislocated 5th MTP, plantar fasciitis...    Tremor> eval by Dalphine Handing in the past w/ trial of Clonazepam w/ some improvement...    Anxiety> not currently on Klonopin etc...    Low [place] ct> He saw [place] Hematologist [place] on referral by DrHolmes, GI [place]; Plats ranged 67-73K & they stopped his 6MP Rx for crohn's dis & he needs f/u labs... We reviewed prob list, meds, xrays and labs> see below for updates >>  HE NEEDS TO RET FOR f/u FASTING blood work...  ~  March 11, 2013:  41moROV & JWille Glaserhas been staqble> CC is nasal congestion, drainage, stopped up; we doiscussed avail OTC meds to help w/ these symptoms- antihist, saline, fluticasone, and we will write for Astelin as well...  BP controlled on Coreg6.25Bid, Lisin10, Lasix40-1.5/d, Inspra25; BP= 136/82 & he feels ok w/o HA, CP, palpit, SOB, edema, etc...  He saw DrKatz 10/14> f/u cardiomyopathy, on Coumadin w/ hx mesenteric embolus, doing satis, no changes made Chol looks good on Simva20 w/ FLP 2/15 showing TChol 170, TG 175, HDL 61, LDL 74; discussed low fat diet... He reports being stable from the GI standpoint & stopped his Questran; hasn't been back to his GI in awhile...  [place]> hx hematuria & flank pain, BPH w/ obstruction; pt had a TURP in [place] (we do not have op note etc)...  Known  thrombocytopenia, [place] Heme in [place] "it's just my pattern" he says...    We reviewed prob list, meds, xrays and labs> see below for updates >> Pt was given Pneumovax & TDAP today...  LABS 2/15:  FLP- Chol ok on Simva20 but TG=175 discussed diet;  Chems- ok x incr fasting bili c/w Gilbert's;  CBC- wnl x low platelets stable at 72K;  TSH=1.59;  Sed=2...  ~  September 09, 2013:  651moOV & [name] has established Primary [facility] [place]>>    [facility]- PT/INR was 6 on adm, EGD showed abn mucosa in the prepyloric region of the stomach & bx was neg for HPylori, Colon showed mild erythema & edema, few sessile polyps, w/o inflamm changes or bleeding sites seen; blood could have come from the sm bowel based on his hx- Hg nadir was 8.5 & he was transfused 2uPC's;  Hg has improved to 11 range but MCV is still low at 71 & Iron has not been checked- I told him to start OTC Fe daily; he is back on Coumadin per [facility] w/ careful monitoring... He has had f/u w/ AE & sched to see DrPyrtle soon...    He had f/u DrKatz for Cards 8/15> CAD, s/p CABG, ICM and chr sys CHF w/ ICD per DrKlein, meds adjusted- on Coreg6.25Bid, Lisin10, Lasix40Bid, K20, Coumadin via CC...    From the Pulm standpoint he had AR w/ Flonase & Astelin but breathing has been satis- denies cough, sput, hemoptysis, ch in SOB/DOE etc;  He had bronchitic infection several months ago & he tells me he took wife's antibiotics and got better!  He also has hx OSA- prev on CPAP but he prob w/ the interface & hasn't used it in  yrs- resting ok & denies daytime hypersomnolence;   last CXR 3/15 showed cardiomeg, pacer, s/p CABG, azygos lobe, ?underlying COPD- we continue to follow for any resp issues...    We reviewed prob list, meds, xrays and labs> see below for updates >>    ~  February 27, 2017:  3.5year ROV & pulmonary follow up appt requested for upper resp infection, burning in the  chest, weakness, and poor memory according to wife;  He continues to be followed in the Coumadin Clinic, has remote ICD/pacer transmissions Q50m& ICM monitoring- last done 02/20/17 on Lasix40Bid, K20/d, and felt to be stable- no changes made... THEY WERE NOT AWARE THAT Pt HAD DECR LASIX ON HIS OWN TO 475md...    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    He saw DrKlein 11/28/16>  CAD-s/p CABG and chr sys CHF w/ EF=35%, he has ICD implanted for primary prevention; last Echo 11/2014 w/ EF=35-40%; EKG shows AV pacing...    He saw PCP office DrWendling 01/19/17> c/o 3d hx sore throat, Temp101, cough; Chest was clear- he was treated for bronchitis w ZPak, fluids, Mucinex;  We reviewed the following medical problems during today's office visit >>     OSA> eval by DrClance 8/13 & started on CPAP but having some interface problems & he is still not currently using the apparatus!    HBP> on Coreg6.25Bid, Lisin10, Lasix40/d, K20; BP= 126/78 & he denies any CP, palpit, ch in SOB/ edema/ etc...    CAD, Cardiomyopathy> on Coumadin via CC; s/p CABG 1994, AICD placed & lfollowed by DrKlein- ICD functioning normally; CAD, s/p CABG w/ one occluded graft, EF=35%, Myoview 10/11 w/ scar, no ischemia; Lasix adjusted to improve periph edema...     Chol> on Simva20; his last FLP 7/18 showed TChol 131, TG 106, HDL 52, LDL 58...    GI- hx GIB, GERD, Polyps> stable on Protonix40 & he currently denies abd pain, n/v, c/d, blood seen...    Crohn's dis, Hx SBO> [place] was [place] and [name] & they have stopped his  prev 6MP rx; now followed by DrPyrtle- Crohn's has been stable...    Cirrhosis followed by DrPyrtle>  Liver fibrosis blood test indicates advanced fibrosis, stage F4, they feel likely due to fatty liver dis;  Abd Sonar 08/2016 showed incr echogenicity c/w fatty infiltration & irreg contour c/w cirrhosis;  REC no etoh, diet rx, monitor for HCC.    Hx mesenteric vasc insuffic w/ embolectomy> on Coumadin & monitored in the CC, stable...    BPH> aware- [facility]; he [facility] we have requested records...    DJD, PMR> on B12, VitD, Folate; stable & not requiring pain meds or Pred... He has seen DrHilts for left foot pain, VI, chr dislocated 5th MTP, plantar fasciitis...    Tremor> eval by DrDalphine Handingn the past w/ trial of Clonazepam w/ some improvement...    Anxiety> not currently on Klonopin etc...[name]  Low Platlet ct> He saw a Hematologist in [place] by [place]; Plats ranged 67-73K & they stopped his 6MP Rx for crohn's dis; Plats remain 50-90K => prob from splenomegaly assoc w/ his cirrhosis due to FLD... EXAM shows Afeb, VSS, O2sat=93% on RA;  HEENT- neg, mallampati2;  Chest- decr BS but clear w/o w/r/r;  Heart- RR gr1/6 SEM w/o r/g;  Abd- soft, nontender, neg;  Ext- VI, +edema, w/o c/c;  Neuro- sl slow, intact, nonfocal...  CXR 02/27/17 (independently reviewed by me in the PACS system) showed borderline heart size, aortic atherosclerosis, AICD on left, and prior CABG; lungs are clear x mild atx at bases, NAD...   LABS 02/27/17>  Chems- wnl x BS=106, Bili=2.6, other LFTs were wnl.  CBC- wnl w/ Hg=15.3 but PLAT=60K;  TSH=2.68;  BNP=118 IMP/PLAN>>  We discussed treating him w/ Depo80 & Medrol 5m tabs- 5d tapering interval (Bid, Qd, 1/2 daily, then Qod til return);  He is asked to incr the LASIX40 to Bid for 5d then back to one Qam;  We will recheck in 4-6wks;  JKennyth Losewanted him to start something for his memory- given  Aricept155m1/2 tab Qd...  ~  March 28, 2017:  14m72moV & [name] Glaserturns for f/u after his 01/2017 visit for URI & post viral syndrome- given Depo80, Medrol taper, and recommended to start on Aricept 5mg47mfor his memory (wife requested that he start on medication);  We also bumped his Lasix to 40Bid for 5d then back to 40mg314m only...  They report that [name] is improved- nasal congestion & hacking cough are better, edema is decr after the transient bump in Lasix + low sodium diet, and his memory "slightly better" having started on the Aricept 10mg-7m tab Qd...  CXR & LABS above- NAD, Plat=60K chronically (likely due to cirrhosis from FLD w/ splenomegaly), Protimes per Coumadin clinic...    PROBLEM LIST AS OUTLINES ABOVE... EXAM shows Afeb, VSS, O2sat=94% on RA;  HEENT- neg, mallampati2;  Chest- decr BS but clear w/o w/r/r;  Heart- RR gr1/6 SEM w/o r/g;  Abd- soft, nontender, neg;  Ext- VI, +tr edema, w/o c/c;  Neuro- slow mentation, intact, nonfocal... IMP/PLAN>>  [name] is improved from his URI/ refractory symptoms after the Medrol course;  Wife feels his memory is sl better on the Aricept5 & wants to incr to 10mg/d100me continues under the care of DrBlyth/ Wendling for primary care and DrPyrtle for GI w/ Sonar Q6mo to 37moh for HCC give[facility]is cirrhosis...          Problem List:  OSA >> He had a sleep study w/ AHI=34- loud snoring & abnormal breathing pattern noted by wife; pt notes freq awakenings & not feeling rested in the AM; but he denied issues w/ daytime sleepiness & his ESS= only1... Started on CPAP & they are following up... ~  He reports mask/ interface problems and he is not using the CPAP; asked to check w/ DME & f/u w/ DrClance...  AR/ AB >> From the Pulm standpoint he had AR w/ Flonase & Astelin but breathing has been satis- denies cough, sput, hemoptysis, ch in SOB/DOE etc;  He had bronchitic infection several months ago & he tells me he took wife's antibiotics and got better!  He also has  hx OSA- prev on CPAP but he prob w/ the interface & hasn't used it in yrs- resting ok & denies daytime hypersomnolence;   last CXR 3/15 showed cardiomeg, pacer,  s/p CABG, azygos lobe, ?underlying COPD- we continue to follow for any resp issues...   CARDS followed by [name] & DrKlein >>  HBP, CAD, Cardiomyopathy >> followed by [name] and DrKlein... he had CABG in 1994 (with cath in 96 revealing a total right and an 80% OM, vein graft to the diagonal was occluded, other bypasses were patent), Biv AICD in 2006...  On COUMADIN (follow in clinic), ASA 27m/d,  COREG 6.232mid,  LISINOPRIL 2069m,  LASIX 7m61m  SPIRONOLACTONE 25mg13m. ~  Cardiolite 3/03 w/ EF=37%...  ~  Cath 6/06 w/ native vessel and graft disease...  ~  2DEcho 11/09 stable w/ mod LVD- diffuse HK w/ EF= 35%, DD, mod incr AoV thickness w/o AS...  ~  3/11:  CXR showed stable CABG, AICD, & chr changes... ~  10/11:  Myoview showed extensive scar, no ischemia, EF= 34% ~  10/11:  New AICD placed by DrKlein... ~  11/11:  2DEcho showed mod reduced LV sys function w/ EF= 30-35%, diffuse HK, gr1 DD> see report... ~  10/12:  BP= 126/68 & stable from the CV perspective... ~  4/13:  He's had f/u w/ Cards> DrKatz et al; notes reviewed, no med changes other than Coumadin regulation... ~  7/13:  His AICD was interrogated by DrKlein & no problems identified... ~  8/13:  2DEcho showed mild LVH, mod decr LVF w/ EF=35-40% w/ HK of the entire myocardium, Gr1DD, mild AoV sclerosis, mild MR, normal RV... ~  8/13:  VenDopplers of LEs ordered by Cards- neg for DVT ~  10/13:  He had f/u DrKatz who noted edema is diminished on the Lasix60... ~  10/13: on Coreg6.25Bid, Lisin10, Lasix40-1.5/d, Aldactone25; BP= 116/72 & he denies any CP, palpit, ch in SOB/ edema/ etc... ~  3/14:  BP controlled on meds> Coreg6.25Bid, Lisin10, Lasix40-1.5/d, Aldactone25; BP= 110/70 & he denies CP, palpit, SOB, etc; but he has developed gynecomastia from the aldactone & we will  switch to Eplerenone25... ~  6/14:  BP=132/78 on his Coreg6.25Bid, Lisinopril10, Lasix40-2Qam, & Eplerenone25 (all breast tenderness has resolved);  He saw DrKatBenay Spice- CAD, cardiomyopathy, some incr edema & Lasix incr to 80Qam... ~  10/14: on Coreg6.25Bid, Lisin10, Lasix40-1.5/d, Inspra25; BP= 140/86 & he denies any CP, palpit, ch in SOB/ edema/ etc; Also on ASA81 & Coumadin; s/p CABG 1994, AICD placed & last seen by DrKlein 7/14- ICD functioning normally; CAD, s/p CABG w/ one occluded graft, EF=35%, Myoview 10/11 w/ scar, no ischemia; Lasix adjusted to improve periph edema ~  2/15: BP controlled on Coreg6.25Bid, Lisin10, Lasix40-1.5/d, Inspra25; BP= 136/82 & he feels ok w/o HA, CP, palpit, SOB, edema, etc; He saw DrKatz 10/14> f/u cardiomyopathy, on Coumadin w/ hx mesenteric embolus, doing satis, no changes made.  Primary Care per DrBlythe >>  HYPERLIPIDEMIA - he takes SIMVASTATIN 20mg/61m he's had min, non-progressive incr LFTs noted. ~  FLP 4/Sevilleshowed TChol 138, TG 138, HDL 38, LDL 72... tolerating med well. ~  FLP 4/Fabensshowed TChol 118, TG 111, HDL 33, LDL 63 ~  FLP 12/09 showed TChol 118, TG 100, HDL 31, LDL 67 ~  FLP 10/10 on Simva20 showed TChol 111, TG 139, HDL 36, LDL 47 ~  FLP 10/11 on Simva20 showed TChol 101, TG 102, HDL 35, LDL 45 ~  FLP 10/12 on Simva20 showed TChol 124, TG 109, HDL 49, LDL 54 ~  FLP 10/13 & 3/14> pt wasn't fasting for blood work today... ~  FLP 2/[place] Simva20 showed TChol 170, TG 175,  HDL 61, LDL 74  GI per DrPyrtle >>  GERD (ICD-530.81) - no active symptoms and not currently taking PPI etc...  COLONIC POLYPS (ICD-211.3) - last colonoscopy here was 7/04 by Demetra Shiner w/ divertics and several polyps from 6 - 46m size, reportedly adenomatous on bx but I cannot find the path reports... he is overdue for f/u colon... ~  labs 2/10 showed Hg= 15.7..Marland KitchenMarland Kitchenwe will send copy of note to DrChowdhury w/ request for f/u colonoscopy. ~  f/u colonoscopy 11/10 by DrChowdhury  showed norm TI & several polyps removed- path +tubular adenoma...  CROHN'S DISEASE (ICD-555.9) - he is [place]Chowdhury in [place] 6-MP 566mtabs- 1.5 tabs/d... he notes intermittent diarrhea and was started on Questran Prn... ~  labs 3/11 noted sl incr LFTs, Protime, & Sed... ~  labs 10/11 showed norm chems & LFTs x incr bili, Plat=88K, BNP=219, otherw norm. ~  Labs 10/12 showed norm chems, SGOT=59, TBili=3.9, Plat=85K... ~  [facility]> off 6MP due to thrombocytopenia & doing satis, considering a biologic if needed, considering repeat colonoscopy... ~  [facility]> doing satis on Cholestyramine4gm daily, no changes made... ~  2/15: He reports being stable from the GI standpoint & stopped his Questran; hasn't been back to his GI in awhile...   Hx of ACUTE VASCULAR INSUFFICIENCY OF INTESTINE (ICD-557.0) - he has embolectomy of the superior mesenteric artery 1/03 by DrSheryn Bisonlong hospitalization w/ complications)... he has remained on COUMADIN in the coumadin clinic ever since then...  VENTRAL HERNIA (ICD-553.20) - he is s/p left inguinal hernia repair 6/08 by DrClarita Leberand had a prev incarcerated ventral hernia repaired 11/03... his residual/ recurrent ventral hernia has been evaluated by DrClarita Leberho feels that [place]... currently he wears an abd binder when up and about...  SBO >> Hosp 3/14 - 04/19/11 by [facility] w/ SBO that opened up w/o surgical intervention... ~  CT Abd 3/13 showed early SBO pattern from an anterior wall hernia defect; ?GE varicies noted, s/p GB, hepatic steatosis, calcif granulomas in liver, cyst in right kidney, atherosclerotic ulcer in infrarenal Ao, ectasia of iliacs...  [place]>  HYPERTROPHY PROSTATE W/UR OBST & OTH LUTS (ICD-600.01) - saw DrNesi 7/10 w/ BPH, obstructive symptoms, and ED- given  Flomax & "shots"...  ~  Labs 10/12 showed PSA= 2.93 ~  10/14: he tells me he had  [facility] [place] & have requested these... ~  2/15: [place]> hx hematuria & flank pain, BPH w/ obstruction; pt had a TURP in [place] (we do not have op note etc)...   DEGENERATIVE JOINT DISEASE (ICD-715.90) >>  ~  6/14:  He presented w/ swelling in the feet, but L>R w/ pain & tender laterally; then he admitted to dropping a monkey wrench on his foot 1 week ago; XRay showed subluxed/ dislocated 5th MTP=> to Ortho ASAP...  Hx of POLYMYALGIA RHEUMATICA (ICD-725) - hasn't req steroids x yrs... ~  labs 2/10 showed Sed= 8 ~  labs 10/10 showed Sed= 6 ~  labs 3/11 showed Sed= 51 w/ acute illness. ~  labs 10/11 showed Sed= 9 ~  Labs 10/12 showed Sed= 9  TREMOR (ICD-781.0) - eval 3/10 [facility]alph[facility]- essential tremor w/ +FamHx in sister... trial Clonazepam 0.2523mid w/ improvement...  ANXIETY (ICD-300.00) - he uses the same Clonazepam 0.17m17mn...  Hx  of SHINGLES (ICD-053.9)  Thrombocytopenia >> serial labs w/ Plat counts betw 223 ==> 85; no bleeding diathesis, & he had acute intestinal vasc insuffic 2003 requiring embolectomy & has been on Coumadin Rx ever since then... ~  He saw a Hematologist in [place] on referral by DrHolmes, [place] in [place]; Plats ranged 67-73K & they stopped his 6MP Rx for crohn's dis & he needs f/u labs.  ANEMIA >> [facility] 3/15 w/ UGI bleed & Hg= 8.5 assoc w/ PT/ INR ~6 ~  Follow up labs & Rx per GI & Primary Care...   VITAMIN B12 DEFICIENCY (ICD-266.2) - on B 12 1084mg/d OTC... ~  labs 2/10 showed B12 level = 215... rec start oral B12 supplement daily. ~  labs 10/10 showed B12 level = 416... continue oral B12 supplement. ~  labs 10/11 showed B12= 592... continue same. ~  Labs 10/12 showed B12= 573  VITAMIN D DEFICIENCY (ICD-268.9) - on Vit D 1000 u daily OTC... ~  labs 2/10 showed Vit D level = 23... rec> start Vit D 1000 u daily.. ~  Labs 10/12 showed Vit D level = 50   Past  Surgical History:  Procedure Laterality Date  . CATARACT EXTRACTION, BILATERAL  2014  . COLONOSCOPY N/A 04/19/2013   Procedure: COLONOSCOPY;  Surgeon: JJerene Bears MD;  [facility] [facility];  Service: Endoscopy;  Laterality: N/A;  . CORONARY ARTERY BYPASS GRAFT  1994  . Elap w/ superior mesenteric artery embolectomy  1/03   by Dr. LJennette Banker . ESOPHAGOGASTRODUODENOSCOPY N/A 04/18/2013   Procedure: ESOPHAGOGASTRODUODENOSCOPY (EGD);  Surgeon: JJerene Bears MD;  [facility];  Service: Endoscopy;  Laterality: N/A;  . implantation of biventric cardioverter-defibrillatorr     by Dr. TLovena Lein 8/06 Guidant Contak H170-explanted 2011  . implantation of guidant cognis device     model n119-2011  . laparoscopic cholecystectomy  2002  . left inguinal hernia repair with mesh  6/08   by Dr. HBetsy Pries . PROSTATE SURGERY  08/2012   [place] . Repair of incarcerated ventral hernia and lysis of adhesions  11/03   by Dr. HBetsy Pries   Outpatient Encounter Medications as of 03/28/2017  Medication Sig  . carvedilol (COREG) 6.25 MG tablet Take 1 tablet (6.25 mg total) by mouth 2 (two) times daily with a meal.  . cholecalciferol (VITAMIN D) 1000 UNITS tablet Take 1,000 Units by mouth daily.   .Marland Kitchendonepezil (ARICEPT) 10 MG tablet Take 1 tablet (10 mg total) by mouth at bedtime.  . furosemide (LASIX) 40 MG tablet Take 1 tablet (40 mg total) by mouth 2 (two) times daily. (Patient taking differently: Take 40 mg by mouth daily. )  . KLOR-CON M20 20 MEQ tablet TAKE ONE TABLET BY MOUTH ONCE DAILY  . lisinopril (PRINIVIL,ZESTRIL) 10 MG tablet Take 1 tablet (10 mg total) by mouth daily.  . methylPREDNISolone (MEDROL) 8 MG tablet Starting 1/29 take one tab twice daily for 5 days....then decrease to one tab each AM for 5 days....then decrease to 1/2 tablet daily in the AM for 5 days....then decrease to 1/2 tab every other day til gone.  . pantoprazole (PROTONIX) 40 MG tablet TAKE 1 TABLET BY MOUTH   EVERY DAY AT 12 NOON  . simvastatin (ZOCOR) 20 MG tablet Take 1 tablet (20 mg total) by mouth at bedtime.  .Marland KitchentiZANidine (ZANAFLEX) 2 MG tablet Take 1 tablet (2 mg total) by mouth at bedtime as needed for muscle spasms.  . vitamin B-12 (  CYANOCOBALAMIN) 1000 MCG tablet Take 1,000 mcg by mouth every other day.  . warfarin (COUMADIN) 5 MG tablet Take as directed by coumadin clinic  . [DISCONTINUED] donepezil (ARICEPT) 10 MG tablet Take 1/2 tablet at bedtime daily   No facility-administered encounter medications on file as of 03/28/2017.     No Known Allergies    Immunization History  Administered Date(s) Administered  . Influenza Split 11/24/2010, 11/30/2011, 10/31/2012  . Influenza Whole 11/06/2008  . Influenza, High Dose Seasonal PF 11/10/2015, 12/01/2016  . Influenza,inj,Quad PF,6+ Mos 12/30/2013, 12/09/2014  . Pneumococcal Conjugate-13 12/09/2014  . Pneumococcal Polysaccharide-23 03/11/2013  . Tdap 03/11/2013    Current Medications, Allergies, Past Medical History, Past Surgical History, Family History, and Social History were reviewed in [employer] record.    Review of Systems         See HPI - all other systems neg except as noted... The patient complains of decreased hearing and dyspnea on exertion.  The patient denies anorexia, fever, weight loss, weight gain, vision loss, hoarseness, chest pain, syncope, peripheral edema, prolonged cough, headaches, hemoptysis, abdominal pain, melena, hematochezia, severe indigestion/heartburn, hematuria, incontinence, muscle weakness, suspicious skin lesions, transient blindness, difficulty walking, depression, unusual weight change, abnormal bleeding, enlarged lymph nodes, and angioedema.     Objective:   Physical Exam     WD, WN, 84 y/o WM in NAD... GENERAL:  Alert & oriented; pleasant & cooperative... HEENT:  [place]/AT, EOM-wnl, PERRLA, EACs-clear, TMs-wnl, NOSE- sl red/ inflamm, THROAT-clear & wnl. NECK:  Supple w/  fairROM; no JVD; normal carotid impulses w/o bruits; no thyromegaly or nodules palpated; no lymphadenopathy. CHEST:  Decr BS w/ few scat rhonchi, no wheezing, no rales, no signs of consolidation. HEART:  Regular Rhythm; gr 1/6 SEM without rubs or gallops detected... ABDOMEN:  Soft & nontender; mult scars, & ventral hernia; normal bowel sounds; no organomegaly or masses palpated. EXT: without deformities, mod arthritic changes; no varicose veins/ venous insuffic/ or edema. NEURO:  CN's intact; I do not apprec a tremor at present; no focal neuro deficits... DERM:  No lesions noted; no rash etc...  RADIOLOGY DATA:  Reviewed in the EPIC EMR & discussed w/ the patient...  LABORATORY DATA:  Reviewed in the EPIC EMR & discussed w/ the patient...   Assessment & Plan:    AR/ AB/ OSA >>  02/27/17 -- We discussed treating him w/ Depo80 & Medrol 25m tabs- 5d tapering interval (Bid, Qd, 1/2 daily, then Qod til return);  He is asked to incr the LASIX40 to Bid for 5d then back to one Qam;  We will recheck in 4-6wks;  JKennyth Losewanted him to start something for his memory- given Aricept172m1/2 tab Qd... 03/28/17>   [name] is improved from his URI/ refractory symptoms after the Medrol course;  Wife feels his memory is sl better on the Aricept5 & wants to incr to 1048m;  He continues under the care of DrBlyth/ [place] for primary care and DrPyrtle for GI w/ Sonar [id number] for [facility] his cirrhosis.   HBP>  BP controlled, continue same meds per Cards and Primary Care...  CAD/ Cardiomyopathy>  Followed by DrKaBenay SpicerKlein, their notes are reviewed...  Hyperlipid>  On Simva20 + diet> continue same...  GI>  Hx Crohn's and Cirrhosis from prob FLD>  Followed by DrPyrtle for [facility] GI now...  Hx vasc insuffic intestines>  SMA embolectomy 2003, he remains on Coumadin via the CC...  [facility]  w/ [facility] Kinds approach per DrHIngram, CCS...  Hx UGIB 3/15> assoc w/ PT/INR~6, Hg nadir ~8,  tx 2u, ?source- EGD/Colon per DrPyrtle, bleeding stopped spont  Left FOOT PAIN => dislocated left 5th MTP joint; seen by DrHilts who felt this was chronic, treated edema w/ compression hose & swelling is down...  Other medical problems as noted...   Patient's Medications  New Prescriptions   No medications on file  Previous Medications   CARVEDILOL (COREG) 6.25 MG TABLET    Take 1 tablet (6.25 mg total) by mouth 2 (two) times daily with a meal.   CHOLECALCIFEROL (VITAMIN D) 1000 UNITS TABLET    Take 1,000 Units by mouth daily.    FUROSEMIDE (LASIX) 40 MG TABLET    Take 1 tablet (40 mg total) by mouth 2 (two) times daily.   KLOR-CON M20 20 MEQ TABLET    TAKE ONE TABLET BY MOUTH ONCE DAILY   LISINOPRIL (PRINIVIL,ZESTRIL) 10 MG TABLET    Take 1 tablet (10 mg total) by mouth daily.   METHYLPREDNISOLONE (MEDROL) 8 MG TABLET    Starting 1/29 take one tab twice daily for 5 days....then decrease to one tab each AM for 5 days....then decrease to 1/2 tablet daily in the AM for 5 days....then decrease to 1/2 tab every other day til gone.   PANTOPRAZOLE (PROTONIX) 40 MG TABLET    TAKE 1 TABLET BY MOUTH  EVERY DAY AT 12 NOON   SIMVASTATIN (ZOCOR) 20 MG TABLET    Take 1 tablet (20 mg total) by mouth at bedtime.   TIZANIDINE (ZANAFLEX) 2 MG TABLET    Take 1 tablet (2 mg total) by mouth at bedtime as needed for muscle spasms.   VITAMIN B-12 (CYANOCOBALAMIN) 1000 MCG TABLET    Take 1,000 mcg by mouth every other day.   WARFARIN (COUMADIN) 5 MG TABLET    Take as directed by coumadin clinic  Modified Medications   Modified Medication Previous Medication   DONEPEZIL (ARICEPT) 10 MG TABLET donepezil (ARICEPT) 10 MG tablet      Take 1 tablet (10 mg total) by mouth at bedtime.    Take 1/2 tablet at bedtime daily  Discontinued Medications   No medications on file

## 2017-03-31 ENCOUNTER — Encounter (HOSPITAL_COMMUNITY)
Admission: RE | Admit: 2017-03-31 | Discharge: 2017-03-31 | Disposition: A | Discharge status: Home / Self Care | Payer: Medicare HMO | Source: Ambulatory Visit | Attending: Internal Medicine | Admitting: Internal Medicine

## 2017-03-31 DIAGNOSIS — K7460 Unspecified cirrhosis of liver: Secondary | ICD-10-CM | POA: Insufficient documentation

## 2017-04-14 ENCOUNTER — Other Ambulatory Visit: Payer: Self-pay

## 2017-04-14 DIAGNOSIS — L821 Other seborrheic keratosis: Secondary | ICD-10-CM | POA: Diagnosis not present

## 2017-04-14 DIAGNOSIS — C44319 Basal cell carcinoma of skin of other parts of face: Secondary | ICD-10-CM | POA: Diagnosis not present

## 2017-04-14 DIAGNOSIS — Z85828 Personal history of other malignant neoplasm of skin: Secondary | ICD-10-CM | POA: Diagnosis not present

## 2017-04-14 DIAGNOSIS — D485 Neoplasm of uncertain behavior of skin: Secondary | ICD-10-CM | POA: Diagnosis not present

## 2017-04-14 DIAGNOSIS — L570 Actinic keratosis: Secondary | ICD-10-CM | POA: Diagnosis not present

## 2017-04-19 ENCOUNTER — Ambulatory Visit (INDEPENDENT_AMBULATORY_CARE_PROVIDER_SITE_OTHER): Payer: Medicare HMO | Admitting: *Deleted

## 2017-04-19 DIAGNOSIS — Z5181 Encounter for therapeutic drug level monitoring: Secondary | ICD-10-CM | POA: Diagnosis not present

## 2017-04-19 DIAGNOSIS — K55069 Acute infarction of intestine, part and extent unspecified: Secondary | ICD-10-CM | POA: Diagnosis not present

## 2017-04-19 DIAGNOSIS — Z0000 Encounter for general adult medical examination without abnormal findings: Secondary | ICD-10-CM

## 2017-04-19 LAB — POCT INR: INR: 4

## 2017-04-19 NOTE — Patient Instructions (Signed)
Description   Do not take coumadin today then continue on same dosage 39m daily.  Recheck INR in 2 weeks. Call our office if you have any concerns or are placed on any new medications [phone] Try to eat some greens today or tomorrow then keep intake of greens consistent

## 2017-04-21 ENCOUNTER — Other Ambulatory Visit: Payer: Self-pay | Admitting: *Deleted

## 2017-04-21 ENCOUNTER — Telehealth: Payer: Self-pay | Admitting: Cardiology

## 2017-04-21 MED ORDER — POTASSIUM CHLORIDE CRYS ER 20 MEQ PO TBCR: 20.0000 meq | EXTENDED_RELEASE_TABLET | Freq: Every day | ORAL | 1 refills | Status: DC

## 2017-04-21 NOTE — Telephone Encounter (Signed)
New Message    *STAT* If patient is at the pharmacy, call can be transferred to refill team.   1. Which medications need to be refilled? (please list name of each medication and dose if known) KLOR-CON M20 20 MEQ tablet  2. Which pharmacy/location (including street and city if local pharmacy) is medication to be sent to? [facility] [place]   3. Do they need a 30 day or 90 day supply? 90 day supply

## 2017-04-25 ENCOUNTER — Ambulatory Visit (INDEPENDENT_AMBULATORY_CARE_PROVIDER_SITE_OTHER): Payer: Medicare HMO

## 2017-04-25 DIAGNOSIS — Z95810 Presence of automatic (implantable) cardiac defibrillator: Secondary | ICD-10-CM | POA: Diagnosis not present

## 2017-04-25 DIAGNOSIS — I5022 Chronic systolic (congestive) heart failure: Secondary | ICD-10-CM

## 2017-04-25 NOTE — Progress Notes (Signed)
EPIC Encounter for ICM Monitoring  Patient Name: CHADWIN FURY is a 84 y.o. male Date: 04/25/2017 Primary Care Physican: [name], MD Primary Cardiologist:Nelson Electrophysiologist: Faustino Congress Weight:Previous [id number] Right Ventricular Pacing 96% and Left Ventricular Pacing 97%        Spoke with wife.  She stated patient is doing fine.      Prescribed dosage: Furosemide 40 mg 1 tablet twice a day. Potassium 20 mEq 1 tablet daily  Recommendations: No changes.   Encouraged to call for fluid symptoms.  Follow-up plan: ICM clinic phone appointment on 05/29/2017.          Copy of ICM check sent to Dr. Caryl Comes.  3 Month Trend    8 Day Data Trend            Rosalene Billings, RN 04/25/2017 2:21 PM

## 2017-05-03 ENCOUNTER — Ambulatory Visit (INDEPENDENT_AMBULATORY_CARE_PROVIDER_SITE_OTHER): Payer: Medicare HMO | Admitting: *Deleted

## 2017-05-03 DIAGNOSIS — Z0000 Encounter for general adult medical examination without abnormal findings: Secondary | ICD-10-CM

## 2017-05-03 DIAGNOSIS — K55069 Acute infarction of intestine, part and extent unspecified: Secondary | ICD-10-CM

## 2017-05-03 DIAGNOSIS — Z5181 Encounter for therapeutic drug level monitoring: Secondary | ICD-10-CM

## 2017-05-03 LAB — POCT INR: INR: 4.8

## 2017-05-03 NOTE — Patient Instructions (Signed)
Description   Skip today and tomorrow's dose, then change your dose to 1 tablet daily except 1/2 tablet on Tuesdays and Saturdays.  Recheck INR in 2 weeks. Call our office if you have any concerns or are placed on any new medications [phone].  keep intake of greens consistent

## 2017-05-10 ENCOUNTER — Ambulatory Visit (INDEPENDENT_AMBULATORY_CARE_PROVIDER_SITE_OTHER): Payer: Medicare HMO | Admitting: *Deleted

## 2017-05-10 DIAGNOSIS — I255 Ischemic cardiomyopathy: Secondary | ICD-10-CM | POA: Diagnosis not present

## 2017-05-10 NOTE — Progress Notes (Signed)
Remote ICD transmission.   

## 2017-05-11 ENCOUNTER — Encounter: Payer: Self-pay | Admitting: Cardiology

## 2017-05-12 ENCOUNTER — Other Ambulatory Visit: Payer: Self-pay | Admitting: Cardiology

## 2017-05-18 ENCOUNTER — Ambulatory Visit (INDEPENDENT_AMBULATORY_CARE_PROVIDER_SITE_OTHER): Payer: Medicare HMO | Admitting: *Deleted

## 2017-05-18 DIAGNOSIS — Z5181 Encounter for therapeutic drug level monitoring: Secondary | ICD-10-CM

## 2017-05-18 DIAGNOSIS — K55069 Acute infarction of intestine, part and extent unspecified: Secondary | ICD-10-CM

## 2017-05-18 DIAGNOSIS — Z0000 Encounter for general adult medical examination without abnormal findings: Secondary | ICD-10-CM

## 2017-05-18 LAB — POCT INR: INR: 1.5

## 2017-05-18 NOTE — Patient Instructions (Signed)
Description   Today April 18th take 1 and 1/2 tablets then continue same  dose  1 tablet daily except 1/2 tablet on Tuesdays and Saturdays.  Recheck INR in 2 weeks. Call our office if you have any concerns or are placed on any new medications [phone].  keep intake of greens consistent

## 2017-05-24 DIAGNOSIS — L570 Actinic keratosis: Secondary | ICD-10-CM | POA: Diagnosis not present

## 2017-05-24 DIAGNOSIS — L905 Scar conditions and fibrosis of skin: Secondary | ICD-10-CM | POA: Diagnosis not present

## 2017-05-24 DIAGNOSIS — Z85828 Personal history of other malignant neoplasm of skin: Secondary | ICD-10-CM | POA: Diagnosis not present

## 2017-05-27 ENCOUNTER — Other Ambulatory Visit: Payer: Self-pay | Admitting: Cardiology

## 2017-05-27 ENCOUNTER — Other Ambulatory Visit: Payer: Self-pay | Admitting: Family Medicine

## 2017-05-29 ENCOUNTER — Ambulatory Visit (INDEPENDENT_AMBULATORY_CARE_PROVIDER_SITE_OTHER): Payer: Medicare HMO

## 2017-05-29 DIAGNOSIS — Z95810 Presence of automatic (implantable) cardiac defibrillator: Secondary | ICD-10-CM

## 2017-05-29 DIAGNOSIS — I5022 Chronic systolic (congestive) heart failure: Secondary | ICD-10-CM | POA: Diagnosis not present

## 2017-05-30 ENCOUNTER — Telehealth: Payer: Self-pay

## 2017-05-30 NOTE — Progress Notes (Signed)
EPIC Encounter for ICM Monitoring  Patient Name: [name] is a 84 y.o. male Date:   05/29/2017 Primary Care Physican: [name], MD Primary Cardiologist:Nelson Electrophysiologist: Faustino Congress Weight:Previous [id number] Right Ventricular Pacing 96% and Left Ventricular Pacing 98%                                                               Attempted call to patient and unable to reach.  Left message to return call regarding transmission.  Transmission reviewed.      Prescribed dosage: Furosemide 40 mg 1 tablet twice a day. Potassium 20 mEq 1 tablet daily  Recommendations: No changes.   Encouraged to call for fluid symptoms.  Follow-up plan: ICM clinic phone appointment on 05/29/2017.          Copy of ICM check sent to Dr. Caryl Comes.  3 Month Trend    8 Day Data Trend

## 2017-05-30 NOTE — Telephone Encounter (Signed)
Remote ICM transmission received.  Attempted call to patient and left message per DPR to return call regarding transmission.

## 2017-06-01 ENCOUNTER — Ambulatory Visit (INDEPENDENT_AMBULATORY_CARE_PROVIDER_SITE_OTHER): Payer: Medicare HMO | Admitting: *Deleted

## 2017-06-01 DIAGNOSIS — Z0000 Encounter for general adult medical examination without abnormal findings: Secondary | ICD-10-CM

## 2017-06-01 DIAGNOSIS — Z5181 Encounter for therapeutic drug level monitoring: Secondary | ICD-10-CM

## 2017-06-01 DIAGNOSIS — Z951 Presence of aortocoronary bypass graft: Secondary | ICD-10-CM

## 2017-06-01 DIAGNOSIS — K55069 Acute infarction of intestine, part and extent unspecified: Secondary | ICD-10-CM

## 2017-06-01 LAB — POCT INR: INR: 4.8

## 2017-06-01 NOTE — Patient Instructions (Signed)
Description   Do not take coumadin today May 2nd and no coumadin on May 3rd then change coumadin dose to   1 tablet daily except 1/2 tablet on Tuesdays Thursdays  and Saturdays.  Recheck INR in 2 weeks. Call our office if you have any concerns or are placed on any new medications [phone].  keep intake of greens consistent

## 2017-06-09 LAB — CUP PACEART REMOTE DEVICE CHECK
Battery Remaining Percentage: 32 %
Brady Statistic RA Percent Paced: 91 %
Brady Statistic RV Percent Paced: 96 %
HighPow Impedance: 57 Ohm
Implantable Lead Implant Date: 20060808
Implantable Lead Implant Date: 20060911
Implantable Lead Location: 753858
Implantable Lead Location: 753860
Implantable Lead Model: 4087
Implantable Lead Serial Number: 261368
Lead Channel Impedance Value: 452 Ohm
Lead Channel Impedance Value: 495 Ohm
Lead Channel Pacing Threshold Amplitude: 0.9 V
Lead Channel Pacing Threshold Amplitude: 1.3 V
Lead Channel Pacing Threshold Pulse Width: 0.4 ms
Lead Channel Setting Pacing Amplitude: 2 V
Lead Channel Setting Pacing Amplitude: 2.3 V
Lead Channel Setting Pacing Amplitude: 2.4 V
Lead Channel Setting Pacing Pulse Width: 0.4 ms
Lead Channel Setting Sensing Sensitivity: 1 mV
MDC IDC LEAD IMPLANT DT: 20060808
MDC IDC LEAD LOCATION: 753859
MDC IDC LEAD SERIAL: 165392
MDC IDC MSMT BATTERY REMAINING LONGEVITY: 18 mo
MDC IDC MSMT LEADCHNL LV IMPEDANCE VALUE: 416 Ohm
MDC IDC MSMT LEADCHNL LV PACING THRESHOLD AMPLITUDE: 0.7 V
MDC IDC MSMT LEADCHNL LV PACING THRESHOLD PULSEWIDTH: 0.8 ms
MDC IDC MSMT LEADCHNL RV PACING THRESHOLD PULSEWIDTH: 0.4 ms
MDC IDC PG IMPLANT DT: 20111028
MDC IDC SESS DTM: 20190408061800
MDC IDC SET LEADCHNL LV PACING PULSEWIDTH: 0.8 ms
MDC IDC SET LEADCHNL RV SENSING SENSITIVITY: 0.6 mV
Pulse Gen Serial Number: 490477

## 2017-06-14 NOTE — Progress Notes (Deleted)
Subjective:   [name] is a 84 y.o. male who presents for Medicare Annual/Subsequent preventive examination.  Review of Systems: No ROS.  Medicare Wellness Visit. Additional risk factors are reflected in the social history.    Sleep patterns:                      Home Safety/Smoke Alarms: Feels safe in home. Smoke alarms in place.  Living environment; residence and Firearm Safety:   Male:   CCS-  No longer doing routine screening due to age.   PSA-  Lab Results  Component Value Date   PSA 2.93 11/24/2010   PSA 3.09 03/21/2008   PSA 2.49 05/03/2006       Objective:    Vitals: There were no vitals taken for this visit.  There is no height or weight on file to calculate BMI.  Advanced Directives 06/10/2016 01/12/2016 12/09/2014 04/18/2013 04/14/2011  Does Patient Have a Medical Advance Directive? No Yes No Patient does not have advance directive Patient does not have advance directive;Patient would not like information  Does patient want to make changes to medical advance directive? - Yes (MAU/Ambulatory/Procedural Areas - Information given) - - -  Would patient like information on creating a medical advance directive? - - Yes - Educational materials given - -    Tobacco Social History   Tobacco Use  Smoking Status Former Smoker  . Packs/day: 1.00  . Years: 20.00  . Pack years: 20.00  . Types: Cigarettes  . Last attempt to quit: 01/31/1973  . Years since quitting: 44.3  Smokeless Tobacco Never Used     Counseling given: Not Answered   Clinical Intake:                       Past Medical History:  Diagnosis Date  . AAA (abdominal aortic aneurysm) ([place])   . Acute bronchitis 09/02/2013  . Acute vascular insufficiency of intestine (HCC)    Mesenteric embolus...surgery 2003  . Anxiety   . Atrial fibrillation (HCC)    paroxysmal  . Back pain 08/08/2016  . Biventricular implantable cardiac defibrillator in situ    GDT CONTAK H170-MADIT-CRT-EXPLANTED  2011 implanted defibrillator- Guidant cognis, model n119-2001 Dr. Caryl Comes (11/28/2009)  . Breast tenderness    Spironolactone was stopped and eplerinone started. Patient feels better  . CAD (coronary artery disease)    CABG 1994  /   catheterization 1996, occluded vein graft to the diagonal, other grafts patent  /   catheterization 2006, no PCI  /  nuclear, October, 2011, extensive scar anteroseptal and apical, no ischemia  . Carotid artery disease ([place])    Doppler, February,  2012, 0-39% bilateral, recommended followup 1 year  . CHF (congestive heart failure) (HCC)    chronic systolic  . Chronotropic incompetence   . Colon polyp   . Colonic polyp 2010   pathology not clear.   . Crohn's disease ([place])   . Degenerative joint disease   . Dermatitis 04/05/2016  . Dizziness    Positional dizziness  . Edema 09/02/2013  . Edema leg    Venous Dopplers 8/13: No DVT bilaterally  . GERD (gastroesophageal reflux disease)   . Hx of CABG    1994  . Hyperglycemia 08/08/2016  . Hyperlipemia   . Hypertension   . Hypertrophy of prostate with urinary obstruction and other lower urinary tract symptoms (LUTS)   . Iron deficiency anemia   . Ischemic cardiomyopathy  CABG 1994 CAD catherization 1996.[name] occluded vein graft to the diagnol, other grafts patent/  catherization 2006, no PCI/ nuclear.[name] october 2011.[name] extensive scar anteroseptal and apical.. no ischemia    . LBBB (left bundle branch block)   . Mitral regurgitation    mild.[name] echo november 2011  EF 35%...echo.. november 2009/ ef 35%.[name] echo november 2011  . Oral thrush 02/05/2015  . Polymyalgia rheumatica ([place])   . Shingles   . Sinus bradycardia   . Skin cancer 08/08/2016  . Thrombocytopenia ([place]) 2008  . Thrombocytopenia ([place]) 11/30/2011  . Thrush   . Tremor   . Ventral hernia    multiple, wears an abd binder, reports he hs been told he was at increased riisk for surgery  . Vitamin B12 deficiency   . Warfarin anticoagulation    Coumadin therapy  for mesenteric embolus 2003   Past Surgical History:  Procedure Laterality Date  . CATARACT EXTRACTION, BILATERAL  2014  . COLONOSCOPY N/A 04/19/2013   Procedure: COLONOSCOPY;  Surgeon: Jerene Bears, MD;  Location[facility] ENDOSCOPY;  Service: Endoscopy;  Laterality: N/A;  . CORONARY ARTERY BYPASS GRAFT  1994  . Elap w/ superior mesenteric artery embolectomy  1/03   by Dr. Jennette Banker  . ESOPHAGOGASTRODUODENOSCOPY N/A 04/18/2013   Procedure: ESOPHAGOGASTRODUODENOSCOPY (EGD);  Surgeon: Jerene Bears, MD;  Location: [facility] ENDOSCOPY;  Service: Endoscopy;  Laterality: N/A;  . implantation of biventric cardioverter-defibrillatorr     by Dr. Lovena Le in 8/06 Guidant Contak H170-explanted 2011  . implantation of guidant cognis device     model n119-2011  . laparoscopic cholecystectomy  2002  . left inguinal hernia repair with mesh  6/08   by Dr. Betsy Pries  . PROSTATE SURGERY  08/2012   by urology in [place]  . Repair of incarcerated ventral hernia and lysis of adhesions  11/03   by Dr. Betsy Pries   Family History  Problem Relation Age of Onset  . Heart disease Mother   . Tremor Sister   . Heart failure Sister   . Heart disease Father 55  . Hypertension Father   . Appendicitis Brother        ruptured  . Diabetes Son   . Heart disease Sister   . Hypertension Sister   . Dementia Sister   . Heart disease Sister   . Hypertension Brother   . Kidney disease Brother   . Myasthenia gravis Brother   . Cirrhosis Brother   . Leukemia Brother   . Hypertension Unknown   . Hyperlipidemia Unknown   . Heart disease Sister   . Edema Sister        pedal edema   Social History   Socioeconomic History  . Marital status: Married    Spouse name: Kennyth Lose  . Number of children: 3  . Years of education: Not on file  . Highest education level: Not on file  Occupational History  . Occupation: bull Actuary  . Occupation: farmer    Fish farm manager: RETIRED  . Occupation: logger  Social Needs  . Financial resource  strain: Not on file  . Food insecurity:    Worry: Not on file    Inability: Not on file  . Transportation needs:    Medical: Not on file    Non-medical: Not on file  Tobacco Use  . Smoking status: Former Smoker    Packs/day: 1.00    Years: 20.00    Pack years: 20.00    Types: Cigarettes    Last attempt to  quit: 01/31/1973    Years since quitting: 44.3  . Smokeless tobacco: Never Used  Substance and Sexual Activity  . Alcohol use: Yes    Alcohol/week: 0.0 oz    Comment: occasional  . Drug use: No  . Sexual activity: Never    Comment: lives with wife, no dietary restrictions  Lifestyle  . Physical activity:    Days per week: Not on file    Minutes per session: Not on file  . Stress: Not on file  Relationships  . Social connections:    Talks on phone: Not on file    Gets together: Not on file    Attends religious service: Not on file    Active member of club or organization: Not on file    Attends meetings of clubs or organizations: Not on file    Relationship status: Not on file  Other Topics Concern  . Not on file  Social History Narrative   Lives in [place], [place] with wife.     Outpatient Encounter Medications as of 06/20/2017  Medication Sig  . carvedilol (COREG) 6.25 MG tablet Take 1 tablet (6.25 mg total) by mouth 2 (two) times daily with a meal.  . cholecalciferol (VITAMIN D) 1000 UNITS tablet Take 1,000 Units by mouth daily.   [name] donepezil (ARICEPT) 10 MG tablet Take 1 tablet (10 mg total) by mouth at bedtime.  . furosemide (LASIX) 40 MG tablet Take 1 tablet (40 mg total) by mouth 2 (two) times daily. (Patient taking differently: Take 40 mg by mouth daily. )  . lisinopril (PRINIVIL,ZESTRIL) 10 MG tablet TAKE 1 TABLET BY MOUTH ONCE DAILY  . methylPREDNISolone (MEDROL) 8 MG tablet Starting 1/29 take one tab twice daily for 5 days....then decrease to one tab each AM for 5 days....then decrease to 1/2 tablet daily in the AM for 5 days....then decrease to 1/2 tab every  other day til gone.  . pantoprazole (PROTONIX) 40 MG tablet TAKE 1 TABLET BY MOUTH ONCE DAILY AT  12  NOON  . potassium chloride SA (KLOR-CON M20) 20 MEQ tablet Take 1 tablet (20 mEq total) by mouth daily.  . simvastatin (ZOCOR) 20 MG tablet TAKE 1 TABLET BY MOUTH AT BEDTIME  . tiZANidine (ZANAFLEX) 2 MG tablet Take 1 tablet (2 mg total) by mouth at bedtime as needed for muscle spasms.  . vitamin B-12 (CYANOCOBALAMIN) 1000 MCG tablet Take 1,000 mcg by mouth every other day.  . warfarin (COUMADIN) 5 MG tablet TAKE AS DIRECTED BY  COUMADIN  CLINIC   No facility-administered encounter medications on file as of 06/20/2017.     Activities of Daily Living No flowsheet data found.  Patient Care Team: [name], MD as PCP - General (Family Medicine) Pyrtle, Lajuan Lines, MD as Consulting Physician (Gastroenterology) Darleen Crocker, MD as Consulting Physician (Ophthalmology) Dorothy Spark, MD as Consulting Physician (Cardiology) Veva Holes as Consulting Physician (Urology) Waynetta Sandy, MD as Consulting Physician (Vascular Surgery) Deboraha Sprang, MD as Consulting Physician (Cardiology)   Assessment:   This is a routine wellness examination for Brek. Physical assessment deferred to PCP.  Exercise Activities and Dietary recommendations   Diet (meal preparation, eat out, water intake, caffeinated beverages, dairy products, fruits and vegetables): {Desc; diets:16563} Breakfast: Lunch:  Dinner:      Goals    . Increase physical activity     As tolerated.  Walking more.  Gardening.         Fall Risk Fall Risk  04/05/2016 01/12/2016 11/10/2015 12/09/2014 09/04/2014  Falls in the past year? No Yes No No Yes  Number falls in past yr: - 1 - - 1  Comment - Tripped over something on the ground while carrying load in his arms. - - -  Injury with Fall? - No - - No   Is the patient's home free of loose throw rugs in walkways, pet beds, electrical cords, etc?   {Blank  single:19197::"yes","no"}      Grab bars in the bathroom? {Blank single:19197::"yes","no"}      Handrails on the stairs?   {Blank single:19197::"yes","no"}      Adequate lighting?   {Blank single:19197::"yes","no"}  Timed Get Up and Go Performed: ***  Depression Screen PHQ 2/9 Scores 04/05/2016 01/12/2016 11/10/2015 12/09/2014  PHQ - 2 Score 0 0 0 0    Cognitive Function MMSE - Mini Mental State Exam 01/12/2016 12/09/2014  Orientation to time 4 5  Orientation to Place 5 5  Registration 3 3  Attention/ Calculation 4 5  Recall 2 3  Language- name 2 objects 2 2  Language- repeat 1 1  Language- follow 3 step command 3 3  Language- read & follow direction 1 1  Write a sentence 0 1  Write a sentence-comments Pt cannot write. -  Copy design 0 1  Total score 25 30        Immunization History  Administered Date(s) Administered  . Influenza Split 11/24/2010, 11/30/2011, 10/31/2012  . Influenza Whole 11/06/2008  . Influenza, High Dose Seasonal PF 11/10/2015, 12/01/2016  . Influenza,inj,Quad PF,6+ Mos 12/30/2013, 12/09/2014  . Pneumococcal Conjugate-13 12/09/2014  . Pneumococcal Polysaccharide-23 03/11/2013  . Tdap 03/11/2013    Screening Tests Health Maintenance  Topic Date Due  . INFLUENZA VACCINE  08/31/2017  . COLONOSCOPY  04/20/2018  . TETANUS/TDAP  03/12/2023  . PNA vac Low Risk Adult  Completed       Plan:   ***  I have personally reviewed and noted the following in the patient's chart:   . Medical and social history . Use of alcohol, tobacco or illicit drugs  . Current medications and supplements . Functional ability and status . Nutritional status . Physical activity . Advanced directives . List of other physicians . Hospitalizations, surgeries, and ER visits in previous 12 months . Vitals . Screenings to include cognitive, depression, and falls . Referrals and appointments  In addition, I have reviewed and discussed with patient certain preventive  protocols, quality metrics, and best practice recommendations. A written personalized care plan for preventive services as well as general preventive health recommendations were provided to patient.     Shela Nevin, [place]  06/14/2017

## 2017-06-15 ENCOUNTER — Ambulatory Visit (INDEPENDENT_AMBULATORY_CARE_PROVIDER_SITE_OTHER): Payer: Medicare HMO | Admitting: *Deleted

## 2017-06-15 DIAGNOSIS — Z5181 Encounter for therapeutic drug level monitoring: Secondary | ICD-10-CM

## 2017-06-15 DIAGNOSIS — K55069 Acute infarction of intestine, part and extent unspecified: Secondary | ICD-10-CM

## 2017-06-15 DIAGNOSIS — Z0000 Encounter for general adult medical examination without abnormal findings: Secondary | ICD-10-CM

## 2017-06-15 LAB — POCT INR: INR: 1.8

## 2017-06-15 NOTE — Patient Instructions (Signed)
Description   Take 1 tablet today then continue taking 1 tablet daily except 1/2 tablet on Tuesdays Thursdays  and Saturdays.  Recheck INR in 2 weeks. Call our office if you have any concerns or are placed on any new medications [phone].  keep intake of greens consistent

## 2017-06-20 ENCOUNTER — Ambulatory Visit: Payer: Medicare HMO | Admitting: *Deleted

## 2017-06-29 ENCOUNTER — Ambulatory Visit: Payer: Medicare HMO

## 2017-06-29 DIAGNOSIS — Z0000 Encounter for general adult medical examination without abnormal findings: Secondary | ICD-10-CM

## 2017-06-29 DIAGNOSIS — Z5181 Encounter for therapeutic drug level monitoring: Secondary | ICD-10-CM | POA: Diagnosis not present

## 2017-06-29 DIAGNOSIS — K55069 Acute infarction of intestine, part and extent unspecified: Secondary | ICD-10-CM | POA: Diagnosis not present

## 2017-06-29 LAB — POCT INR: INR: 2.1 (ref 2.0–3.0)

## 2017-06-29 NOTE — Patient Instructions (Signed)
Description   Continue on same dosage 1 tablet daily except 1/2 tablet on Tuesdays, Thursdays, and Saturdays.  Recheck INR in 3 weeks. Call our office if you have any concerns or are placed on any new medications [phone].  keep intake of greens consistent

## 2017-07-03 ENCOUNTER — Ambulatory Visit (INDEPENDENT_AMBULATORY_CARE_PROVIDER_SITE_OTHER): Payer: Medicare HMO

## 2017-07-03 DIAGNOSIS — Z95810 Presence of automatic (implantable) cardiac defibrillator: Secondary | ICD-10-CM | POA: Diagnosis not present

## 2017-07-03 DIAGNOSIS — I5022 Chronic systolic (congestive) heart failure: Secondary | ICD-10-CM | POA: Diagnosis not present

## 2017-07-04 NOTE — Progress Notes (Signed)
EPIC Encounter for ICM Monitoring  Patient Name: [name] is a 84 y.o. male Date: 07/04/2017 Primary Care Physican: [name], MD Primary Cardiologist:Nelson Electrophysiologist: Caryl Comes Dry Weight:186lbs Right Ventricular Pacing 96% and Left Ventricular Pacing 98%         Heart Failure questions reviewed, pt asymptomatic for fluid symptoms. He said he has been outside today and had some nausea but feeling better since increasing fluid intake.     Prescribed dosage: Furosemide 40 mg 1 tablet twice a day. Potassium 20 mEq 1 tablet daily  Recommendations: No changes.   Encouraged to call for fluid symptoms.  Follow-up plan: ICM clinic phone appointment on 08/14/2017.          Copy of ICM check sent to Dr. Caryl Comes.  3 Month Trend    8 Day Data Trend            Rosalene Billings, RN 07/04/2017 12:11 PM

## 2017-07-07 DIAGNOSIS — H524 Presbyopia: Secondary | ICD-10-CM | POA: Diagnosis not present

## 2017-07-20 ENCOUNTER — Ambulatory Visit: Payer: Medicare HMO

## 2017-07-20 DIAGNOSIS — K55069 Acute infarction of intestine, part and extent unspecified: Secondary | ICD-10-CM | POA: Diagnosis not present

## 2017-07-20 DIAGNOSIS — Z5181 Encounter for therapeutic drug level monitoring: Secondary | ICD-10-CM | POA: Diagnosis not present

## 2017-07-20 DIAGNOSIS — Z0000 Encounter for general adult medical examination without abnormal findings: Secondary | ICD-10-CM | POA: Diagnosis not present

## 2017-07-20 LAB — POCT INR: INR: 1.7 — AB (ref 2.0–3.0)

## 2017-07-20 NOTE — Patient Instructions (Signed)
Description   Start taking 1 tablet daily except 1/2 tablet on Tuesdays and Saturdays.  Recheck INR in 2 weeks. Call our office if you have any concerns or are placed on any new medications [phone].  keep intake of greens consistent

## 2017-08-07 ENCOUNTER — Ambulatory Visit: Payer: Medicare HMO | Admitting: *Deleted

## 2017-08-07 DIAGNOSIS — Z5181 Encounter for therapeutic drug level monitoring: Secondary | ICD-10-CM

## 2017-08-07 DIAGNOSIS — K55069 Acute infarction of intestine, part and extent unspecified: Secondary | ICD-10-CM

## 2017-08-07 DIAGNOSIS — Z0000 Encounter for general adult medical examination without abnormal findings: Secondary | ICD-10-CM

## 2017-08-07 LAB — POCT INR: INR: 2.2 (ref 2.0–3.0)

## 2017-08-07 NOTE — Patient Instructions (Signed)
Description   Continue same dose of coumadin  1 tablet daily except 1/2 tablet on Tuesdays and Saturdays.  Recheck INR in 2 weeks. Call our office if you have any concerns or are placed on any new medications [phone].  keep intake of greens consistent

## 2017-08-08 DIAGNOSIS — K219 Gastro-esophageal reflux disease without esophagitis: Secondary | ICD-10-CM | POA: Diagnosis not present

## 2017-08-08 DIAGNOSIS — Z7901 Long term (current) use of anticoagulants: Secondary | ICD-10-CM | POA: Diagnosis not present

## 2017-08-08 DIAGNOSIS — E785 Hyperlipidemia, unspecified: Secondary | ICD-10-CM | POA: Diagnosis not present

## 2017-08-08 DIAGNOSIS — I252 Old myocardial infarction: Secondary | ICD-10-CM | POA: Diagnosis not present

## 2017-08-08 DIAGNOSIS — Z8249 Family history of ischemic heart disease and other diseases of the circulatory system: Secondary | ICD-10-CM | POA: Diagnosis not present

## 2017-08-08 DIAGNOSIS — R69 Illness, unspecified: Secondary | ICD-10-CM | POA: Diagnosis not present

## 2017-08-08 DIAGNOSIS — I110 Hypertensive heart disease with heart failure: Secondary | ICD-10-CM | POA: Diagnosis not present

## 2017-08-08 DIAGNOSIS — I509 Heart failure, unspecified: Secondary | ICD-10-CM | POA: Diagnosis not present

## 2017-08-08 DIAGNOSIS — I4891 Unspecified atrial fibrillation: Secondary | ICD-10-CM | POA: Diagnosis not present

## 2017-08-08 DIAGNOSIS — I2510 Atherosclerotic heart disease of native coronary artery without angina pectoris: Secondary | ICD-10-CM | POA: Diagnosis not present

## 2017-08-14 ENCOUNTER — Ambulatory Visit (INDEPENDENT_AMBULATORY_CARE_PROVIDER_SITE_OTHER): Payer: Medicare HMO | Admitting: *Deleted

## 2017-08-14 ENCOUNTER — Ambulatory Visit (INDEPENDENT_AMBULATORY_CARE_PROVIDER_SITE_OTHER): Payer: Medicare HMO

## 2017-08-14 DIAGNOSIS — I5022 Chronic systolic (congestive) heart failure: Secondary | ICD-10-CM

## 2017-08-14 DIAGNOSIS — I255 Ischemic cardiomyopathy: Secondary | ICD-10-CM

## 2017-08-14 DIAGNOSIS — Z95810 Presence of automatic (implantable) cardiac defibrillator: Secondary | ICD-10-CM

## 2017-08-14 NOTE — Progress Notes (Signed)
Remote ICD transmission.   

## 2017-08-14 NOTE — Progress Notes (Signed)
EPIC Encounter for ICM Monitoring  Patient Name: [name] is a 84 y.o. male Date: 08/14/2017 Primary Care Physican: [name], MD Primary Cardiologist:Nelson Electrophysiologist: Caryl Comes Dry Weight:180lbs Right Ventricular Pacing 96% and Left Ventricular Pacing 98%       Spoke with wife.  Heart Failure questions reviewed, pt asymptomatic.   Average Respiratory rate 20-21.    Prescribed dosage: Furosemide 40 mg 1 tablet twice a day. Potassium 20 mEq 1 tablet daily  Recommendations: No changes.   Encouraged to call for fluid symptoms.  Follow-up plan: ICM clinic phone appointment on 09/14/2017.  Advised to call office to schedule appointment with Dr Meda Coffee or PA/NP since last visit was 03/2016.    Copy of ICM check sent to Dr. Caryl Comes.   3 month ICM trend: 08/14/2017        Rosalene Billings, RN 08/14/2017 4:11 PM

## 2017-08-15 LAB — CUP PACEART REMOTE DEVICE CHECK
Battery Remaining Longevity: 7 mo
Brady Statistic RV Percent Paced: 96 %
Date Time Interrogation Session: 20190715092700
HighPow Impedance: 55 Ohm
Implantable Lead Implant Date: 20060808
Implantable Lead Implant Date: 20060911
Implantable Lead Location: 753860
Implantable Lead Model: 4087
Implantable Lead Serial Number: 261368
Implantable Pulse Generator Implant Date: 20111028
Lead Channel Pacing Threshold Amplitude: 0.7 V
Lead Channel Pacing Threshold Amplitude: 0.9 V
Lead Channel Pacing Threshold Amplitude: 1.3 V
Lead Channel Pacing Threshold Pulse Width: 0.4 ms
Lead Channel Pacing Threshold Pulse Width: 0.4 ms
Lead Channel Setting Pacing Amplitude: 2.3 V
Lead Channel Setting Pacing Amplitude: 2.4 V
Lead Channel Setting Pacing Pulse Width: 0.4 ms
Lead Channel Setting Pacing Pulse Width: 0.8 ms
MDC IDC LEAD IMPLANT DT: 20060808
MDC IDC LEAD LOCATION: 753858
MDC IDC LEAD LOCATION: 753859
MDC IDC LEAD SERIAL: 165392
MDC IDC MSMT BATTERY REMAINING PERCENTAGE: 10 %
MDC IDC MSMT LEADCHNL LV IMPEDANCE VALUE: 421 Ohm
MDC IDC MSMT LEADCHNL LV PACING THRESHOLD PULSEWIDTH: 0.8 ms
MDC IDC MSMT LEADCHNL RA IMPEDANCE VALUE: 453 Ohm
MDC IDC MSMT LEADCHNL RV IMPEDANCE VALUE: 495 Ohm
MDC IDC SET LEADCHNL LV PACING AMPLITUDE: 2 V
MDC IDC SET LEADCHNL LV SENSING SENSITIVITY: 1 mV
MDC IDC SET LEADCHNL RV SENSING SENSITIVITY: 0.6 mV
MDC IDC STAT BRADY RA PERCENT PACED: 91 %
Pulse Gen Serial Number: 490477

## 2017-08-16 ENCOUNTER — Encounter: Payer: Self-pay | Admitting: Cardiology

## 2017-08-16 ENCOUNTER — Telehealth: Payer: Self-pay | Admitting: Cardiology

## 2017-08-16 ENCOUNTER — Other Ambulatory Visit: Payer: Self-pay | Admitting: Cardiology

## 2017-08-16 NOTE — Telephone Encounter (Signed)
New Message:      Pt c/o of Chest Pain: STAT if CP now or developed within 24 hours  1. Are you having CP right now? no  2. Are you experiencing any other symptoms (ex. SOB, nausea, vomiting, sweating)? no  3. How long have you been experiencing CP? Off and on for a while states the pt's wife  4. Is your CP continuous or coming and going? Coming and going  5. Have you taken Nitroglycerin? No    Pt's wife states the pt has this pain 1-2 times a week. Pt has had his device checked out and they said the pain he is having is not due to the device. Pt's wife would like the pt to be seen asap. ?

## 2017-08-16 NOTE — Telephone Encounter (Signed)
Left message for patient to call back  

## 2017-08-17 NOTE — Telephone Encounter (Signed)
Returned call to Pt.  Spoke with Pt wife.   Wife had called triage yesterday concerned about Pt's chest pain and wanting soon appointment.  Per wife Pt is having pain around his device.  Wife states device was interrogated by device clinic and was deemed ok.  Per wife Pt is a Pt of Dr. Meda Coffee but wife states they have never seen her.  Advised wife this nurse could get Pt in to be seen July 30 with M. Bonnell Public.  Per wife Pt has some early dementia and he does better with a male doctor.  Advised wife if requesting someone else would delay Pt's visit.  Wife states that is ok. Wife requesting to be seen by man or SW.  First available for SW 10/04/2017.  Wife requests this time.  Appt made. Advised if chest pain increases or Pt has increasing SOB Pt go to ER.  Wife indicates understanding.

## 2017-08-18 ENCOUNTER — Other Ambulatory Visit: Payer: Self-pay | Admitting: Cardiology

## 2017-08-21 ENCOUNTER — Ambulatory Visit: Payer: Medicare HMO | Admitting: *Deleted

## 2017-08-21 DIAGNOSIS — Z0000 Encounter for general adult medical examination without abnormal findings: Secondary | ICD-10-CM

## 2017-08-21 DIAGNOSIS — Z5181 Encounter for therapeutic drug level monitoring: Secondary | ICD-10-CM | POA: Diagnosis not present

## 2017-08-21 DIAGNOSIS — K55069 Acute infarction of intestine, part and extent unspecified: Secondary | ICD-10-CM

## 2017-08-21 LAB — POCT INR: INR: 2.6 (ref 2.0–3.0)

## 2017-08-21 NOTE — Patient Instructions (Signed)
Description   Continue same dose of coumadin  1 tablet daily except 1/2 tablet on Tuesdays and Saturdays.  Recheck INR in 4 weeks. Call our office if you have any concerns or are placed on any new medications [phone].  keep intake of greens consistent

## 2017-09-14 ENCOUNTER — Other Ambulatory Visit: Payer: Self-pay | Admitting: Cardiology

## 2017-09-14 ENCOUNTER — Ambulatory Visit (INDEPENDENT_AMBULATORY_CARE_PROVIDER_SITE_OTHER): Payer: Medicare HMO

## 2017-09-14 DIAGNOSIS — Z95810 Presence of automatic (implantable) cardiac defibrillator: Secondary | ICD-10-CM | POA: Diagnosis not present

## 2017-09-14 DIAGNOSIS — I5022 Chronic systolic (congestive) heart failure: Secondary | ICD-10-CM

## 2017-09-14 NOTE — Progress Notes (Signed)
EPIC Encounter for ICM Monitoring  Patient Name: [name] is a 84 y.o. male Date: 09/14/2017 Primary Care Physican: [name], MD Primary Cardiologist:Nelson Electrophysiologist: Faustino Congress Weight:Previous weight 180lbs Right Ventricular Pacing 96% and Left Ventricular Pacing 98%       Attempted call to patient and unable to reach.  Transmission reviewed.    Average Respiratory rate 19-20.    Prescribed dosage: Furosemide 40 mg 1 tablet twice a day. Potassium 20 mEq 1 tablet daily  Recommendations:   Follow-up plan: ICM clinic phone appointment on 10/16/2017.  Office appointment scheduled 10/04/2017 with Richardson Dopp, PA and Dr Caryl Comes 11/10/2017.        Copy of ICM check sent to Dr. Caryl Comes.  [place], RN 09/14/2017 4:58 PM

## 2017-09-15 ENCOUNTER — Telehealth: Payer: Self-pay

## 2017-09-15 NOTE — Telephone Encounter (Signed)
Remote ICM transmission received.  Attempted call to patient and answer.

## 2017-09-18 ENCOUNTER — Ambulatory Visit: Payer: Medicare HMO | Admitting: Pharmacist

## 2017-09-18 DIAGNOSIS — Z0000 Encounter for general adult medical examination without abnormal findings: Secondary | ICD-10-CM

## 2017-09-18 DIAGNOSIS — K55069 Acute infarction of intestine, part and extent unspecified: Secondary | ICD-10-CM

## 2017-09-18 DIAGNOSIS — Z5181 Encounter for therapeutic drug level monitoring: Secondary | ICD-10-CM | POA: Diagnosis not present

## 2017-09-18 LAB — POCT INR: INR: 2.2 (ref 2.0–3.0)

## 2017-09-18 NOTE — Patient Instructions (Signed)
Continue same dose of coumadin  1 tablet daily except 1/2 tablet on Tuesdays and Saturdays.  Recheck INR in 4 weeks. Call our office if you have any concerns or are placed on any new medications [phone].  keep intake of greens consistent

## 2017-09-29 ENCOUNTER — Other Ambulatory Visit: Payer: Self-pay | Admitting: Family Medicine

## 2017-10-04 ENCOUNTER — Ambulatory Visit: Payer: Medicare HMO | Admitting: Physician Assistant

## 2017-10-04 ENCOUNTER — Encounter: Payer: Self-pay | Admitting: Physician Assistant

## 2017-10-04 VITALS — BP 120/76 | HR 60 | Ht 72.0 in | Wt 188.1 lb

## 2017-10-04 DIAGNOSIS — I5022 Chronic systolic (congestive) heart failure: Secondary | ICD-10-CM | POA: Diagnosis not present

## 2017-10-04 DIAGNOSIS — I480 Paroxysmal atrial fibrillation: Secondary | ICD-10-CM

## 2017-10-04 DIAGNOSIS — Z95810 Presence of automatic (implantable) cardiac defibrillator: Secondary | ICD-10-CM | POA: Diagnosis not present

## 2017-10-04 DIAGNOSIS — I10 Essential (primary) hypertension: Secondary | ICD-10-CM | POA: Diagnosis not present

## 2017-10-04 DIAGNOSIS — I2510 Atherosclerotic heart disease of native coronary artery without angina pectoris: Secondary | ICD-10-CM | POA: Diagnosis not present

## 2017-10-04 NOTE — Progress Notes (Signed)
Cardiology Office Note:    Date:  10/04/2017   ID:  [name], DOB February 11, 1931, MRN [id number]  PCP:  [name], MD  Cardiologist:   Previously Dr. Meda Coffee >> per request, will est with Sherren Mocha, MD/Abrian Hanover Kathlen Mody, PA-C  Electrophysiologist:  Virl Axe, MD   Vascular Surgery:  Servando Snare, MD   Referring MD: [name], MD   Chief Complaint  Patient presents with  . Follow-up    CHF    History of Present Illness:    [name] is a 84 y.o. male with coronary artery disease s/p CABG, systolic congestive heart failure secondary to ischemic cardiomyopathy, s/p CRT-D, paroxysmal atrial fibrillation, abdominal aortic aneurysm (followed by vascular surgery - 3.4 cm by CT in 05/2016), hypertension, hyperlipidemia.  He is on chronic anticoagulation with Warfarin which is followed in our Coumadin Clinic.  He was last seen in 10/2016.     [name] returns for follow-up.  He is here with his wife.  She notes that he has been more short of breath at times.  He denies orthopnea or PND.  He has chronic ankle edema without significant change.  He denies any weight changes.  He denies chest pain.  He is quite active.  He enjoyed working in his garden throughout the summer.  His wife was worried times when he was working out in the extreme heat.  Prior CV studies:   The following studies were reviewed today:  Echo 11/03/2014 Inferolateral hypokinesis, moderate concentric LVH, EF 35-40, normal wall motion, grade 1 diastolic dysfunction, moderately dilated LA, normal RVSF  Carotid US 10/07/2014 Bilateral ICA 1-39 Follow-up 1 year  Nuclear stress test 11/23/2009 No ischemia, EF 34, extensive anterior, anterolateral, anteroseptal, apical scar  Cardiac catheterization 07/19/2004 LM 30 LAD proximal 100 OM1 mid 80 RCA proximal 100 SVG-DX 100 SVG-PDA/PLA mid and distal 30-40 LIMA-LAD patent EF 15  Past Medical History:  Diagnosis Date  . AAA (abdominal aortic aneurysm)  ([place])   . Abdominal pain 04/14/2011  . Acute bronchitis 09/02/2013  . Acute vascular insufficiency of intestine (HCC)    Mesenteric embolus...surgery 2003  . Allergic rhinitis 09/09/2013  . Anxiety   . Aortic atherosclerosis ([place]) 11/10/2015  . Atrial fibrillation (HCC)    paroxysmal  . Back pain 08/08/2016  . Biventricular implantable cardiac defibrillator in situ    GDT CONTAK H170-MADIT-CRT-EXPLANTED 2011 implanted defibrillator- Guidant cognis, model n119-2001 Dr. Caryl Comes (11/28/2009)  . Breast tenderness    Spironolactone was stopped and eplerinone started. Patient feels better  . CAD (coronary artery disease)    CABG 1994  /   catheterization 1996, occluded vein graft to the diagonal, other grafts patent  /   catheterization 2006, no PCI  /  nuclear, October, 2011, extensive scar anteroseptal and apical, no ischemia  . Cardiomyopathy, ischemic 09/15/2008   nuclear scan November 23, 2009.  This revealed extensive old scar.  There was no significant ischemia.  Ejection fraction was 34%. Last cath 2006   . Carotid artery disease ([place])    Doppler, February,  2012, 0-39% bilateral, recommended followup 1 year  . CHF (congestive heart failure) (HCC)    chronic systolic  . Chronic systolic heart failure (HCC)    chronic systolic   . Chronotropic incompetence   . Cirrhosis, nonalcoholic ([place]) 06/08/3265  . Colon polyp   . Colonic polyp 2010   pathology not clear.   . COLONIC POLYPS 03/23/2007   Qualifier: Diagnosis of  By: Lenna Gilford  MD, Deborra Medina   . Crohn's disease ([place])   . Crohn's disease of large intestine ([place])   . Degenerative joint disease   . Dermatitis 04/05/2016  . Dizziness    Positional dizziness  . Edema 09/02/2013  . Edema leg    Venous Dopplers 8/13: No DVT bilaterally  . Ejection fraction    EF 35%, echo, November, 2011  //   Is 35-40%, echo,  September 23, 2011   . Encounter for therapeutic drug monitoring 03/08/2017  . Fatigue 06/25/2008   Qualifier: Diagnosis of  By: [name], MD, Leonidas Romberg  Dorinda Hill   . GERD 02/21/2007   Qualifier: Diagnosis of  By: Ronnald Ramp CNA/MA, Janett Billow    . GERD (gastroesophageal reflux disease)   . Hx of CABG    1994  . Hyperglycemia 08/08/2016  . Hyperlipemia   . Hypertension   . Hypertrophy of prostate with urinary obstruction and other lower urinary tract symptoms (LUTS)   . Hypoalbuminemia 06/16/2013  . Hypokalemia 06/18/2013  . Implantable cardioverter-defibrillator (ICD) in situ 12/09/2009   Annotation: EXPLANTED 2011   //   New Guidant ICD implanted 2011     . Iron deficiency anemia   . Ischemic cardiomyopathy    CABG 1994 CAD catherization 1996.[name] occluded vein graft to the diagnol, other grafts patent/  catherization 2006, no PCI/ nuclear.[name] october 2011.[name] extensive scar anteroseptal and apical.. no ischemia    . LBBB (left bundle branch block)   . Left ankle pain 06/07/2013   The patient is having pain in his left ankle, May, 2015   . MCI (mild cognitive impairment) 02/27/2017  . 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 Mitral regurgitation    mild.[name] echo november 2011  EF 35%...echo.. november 2009/ ef 35%.[name] echo november 2011  . Oral thrush 02/05/2015  . OSA (obstructive sleep apnea) 09/16/2011   NPSG 2013:  AHI 34/hr.    . Polymyalgia rheumatica ([place])   . Post viral syndrome 02/27/2017  . Preventative health care 10/17/2016  . Shingles   . Sinus bradycardia   . Skin cancer 08/08/2016  . Thrombocytopenia ([place]) 2008  . Thrombocytopenia ([place]) 11/30/2011  . Thrush   . Tremor   . Ventral hernia    multiple, wears an abd binder, reports he hs been told he was at increased riisk for surgery  . Vitamin B 12 deficiency 11/06/2008   Qualifier: Diagnosis of  By: Lenna Gilford MD, Deborra Medina   . Vitamin B12 deficiency   . Vitamin D deficiency 11/06/2008   Qualifier: Diagnosis of  By: Lenna Gilford MD, Deborra Medina   . Warfarin anticoagulation    Coumadin therapy for mesenteric embolus 2003   Surgical Hx: The patient  has a past surgical history that includes Coronary  artery bypass graft (1994); laparoscopic cholecystectomy (2002); Elap w/ superior mesenteric artery embolectomy (1/03); Repair of incarcerated ventral hernia and lysis of adhesions (11/03); implantation of biventric cardioverter-defibrillatorr; implantation of guidant cognis device; left inguinal hernia repair with mesh (6/08); Prostate surgery (08/2012); Esophagogastroduodenoscopy (N/A, 04/18/2013); Colonoscopy (N/A, 04/19/2013); and Cataract extraction, bilateral (2014).   Current Medications: Current Meds  Medication Sig  . carvedilol (COREG) 6.25 MG tablet TAKE 1 TABLET BY MOUTH TWICE DAILY WITH MEALS  . cholecalciferol (VITAMIN D) 1000 UNITS tablet Take 1,000 Units by mouth daily.   [name] donepezil (ARICEPT) 10 MG tablet Take 1 tablet (10 mg total) by mouth at bedtime.  . furosemide (LASIX) 40 MG tablet Take 1 tablet (40 mg total) by mouth 2 (two) times  daily. Please keep upcoming appt in September before anymore refills. Thank you  . lisinopril (PRINIVIL,ZESTRIL) 10 MG tablet TAKE 1 TABLET BY MOUTH ONCE DAILY  . pantoprazole (PROTONIX) 40 MG tablet TAKE 1 TABLET BY MOUTH ONCE DAILY AT  12  NOON  . potassium chloride SA (KLOR-CON M20) 20 MEQ tablet Take 1 tablet (20 mEq total) by mouth daily.  . simvastatin (ZOCOR) 20 MG tablet TAKE 1 TABLET BY MOUTH AT BEDTIME  . tiZANidine (ZANAFLEX) 2 MG tablet Take 1 tablet (2 mg total) by mouth at bedtime as needed for muscle spasms.  . vitamin B-12 (CYANOCOBALAMIN) 1000 MCG tablet Take 1,000 mcg by mouth every other day.  . warfarin (COUMADIN) 5 MG tablet TAKE AS DIRECTED BY  COUMADIN  CLINIC     Allergies:   Patient has no known allergies.   Social History   Tobacco Use  . Smoking status: Former Smoker    Packs/day: 1.00    Years: 20.00    Pack years: 20.00    Types: Cigarettes    Last attempt to quit: 01/31/1973    Years since quitting: 44.7  . Smokeless tobacco: Never Used  Substance Use Topics  . Alcohol use: Yes    Alcohol/week: 0.0 standard  drinks    Comment: occasional  . Drug use: No     Family Hx: The patient's family history includes Appendicitis in his brother; Cirrhosis in his brother; Dementia in his sister; Diabetes in his son; Edema in his sister; Heart disease in his mother, sister, sister, and sister; Heart disease (age of onset: 56) in his father; Heart failure in his sister; Hyperlipidemia in his unknown relative; Hypertension in his brother, father, sister, and unknown relative; Kidney disease in his brother; Leukemia in his brother; Myasthenia gravis in his brother; Tremor in his sister.  ROS:   Please see the history of present illness.    Review of Systems  Constitution: Positive for decreased appetite and malaise/fatigue.  HENT: Positive for hearing loss.   Cardiovascular: Positive for dyspnea on exertion and leg swelling.  Hematologic/Lymphatic: Bruises/bleeds easily.   All other systems reviewed and are negative.   EKGs/Labs/Other Test Reviewed:    EKG:  EKG is  ordered today.  The ekg ordered today demonstrates AV paced HR 60  Recent Labs: 02/27/2017: ALT 14; BUN 8; Creatinine, Ser 0.95; Hemoglobin 15.3; Platelets 60.0; Potassium 4.0; Pro B Natriuretic peptide (BNP) 118.0; Sodium 142; TSH 2.68   Recent Lipid Panel Lab Results  Component Value Date/Time   CHOL 131 08/08/2016 11:24 AM   TRIG 106.0 08/08/2016 11:24 AM   HDL 52.10 08/08/2016 11:24 AM   CHOLHDL 3 08/08/2016 11:24 AM   LDLCALC 58 08/08/2016 11:24 AM   LDLDIRECT 81.6 12/30/2013 12:13 PM    Physical Exam:    VS:  BP 120/76   Pulse 60   Ht 6' (1.829 m)   Wt 188 lb 1.9 oz (85.3 kg)   BMI 25.51 kg/m     Wt Readings from Last 3 Encounters:  10/04/17 188 lb 1.9 oz (85.3 kg)  03/28/17 190 lb 9.6 oz (86.5 kg)  02/27/17 194 lb (88 kg)     Physical Exam  Constitutional: He is oriented to person, place, and time. He appears well-developed and well-nourished. No distress.  HENT:  Head: Normocephalic and atraumatic.  Eyes: No  scleral icterus.  Neck: No JVD present. No thyromegaly present.  Cardiovascular: Normal rate and regular rhythm.  No murmur heard. Pulmonary/Chest: Effort normal. He has  no wheezes. He has no rales.  Abdominal: Soft. He exhibits no distension.  Musculoskeletal: He exhibits edema (1+ bilat ankle edema).  Lymphadenopathy:    He has no cervical adenopathy.  Neurological: He is alert and oriented to person, place, and time.  Skin: Skin is warm and dry.  Psychiatric: He has a normal mood and affect.    ASSESSMENT & PLAN:    Chronic systolic heart failure (HCC) EF 35-40 by echocardiogram in 2016.  He is NYHA 2.  Continue current management which includes carvedilol, lisinopril, Lasix.  Coronary artery disease involving native coronary artery of native heart without angina pectoris History of CABG.  Cardiac catheterization in 2006 demonstrated 2 out of 3 bypass grafts patent.  He denies anginal symptoms.  He is not on aspirin as he is on warfarin.  Continue simvastatin.  Paroxysmal atrial fibrillation (HCC)  He is tolerating anticoagulation with warfarin.  Obtain follow-up BMET, CBC today.  Essential hypertension The patient's blood pressure is controlled on his current regimen.  Continue current therapy.   Biventricular ICD (implantable cardioverter-defibrillator) in place Follow-up with EP as planned   Dispo:  Return in about 6 months (around 04/04/2018) for Routine Follow Up, w/ Richardson Dopp, PA-C.   Medication Adjustments/Labs and Tests Ordered: Current medicines are reviewed at length with the patient today.  Concerns regarding medicines are outlined above.  Tests Ordered: Orders Placed This Encounter  Procedures  . Basic Metabolic Panel (BMET)  . CBC  . EKG 12-Lead   Medication Changes: No orders of the defined types were placed in this encounter.   Signed, Richardson Dopp, PA-C  10/04/2017 5:08 PM    [facility] [address], [place], [place]   [address] Phone: [phone]; Fax: [phone]

## 2017-10-04 NOTE — Progress Notes (Signed)
Fine with me thanks [employer]

## 2017-10-04 NOTE — Patient Instructions (Signed)
Medication Instructions:  Your physician recommends that you continue on your current medications as directed. Please refer to the Current Medication list given to you today.   Labwork: TODAY BMET, CBC  Testing/Procedures: NONE ORDERED TODAY  Follow-Up: 6 MONTHS WITH SCOTT WEAVER, PAC   KEEP YOUR APPT WITH DR. Caryl Comes 10/2017  Any Other Special Instructions Will Be Listed Below (If Applicable).     If you need a refill on your cardiac medications before your next appointment, please call your pharmacy.

## 2017-10-05 LAB — BASIC METABOLIC PANEL
BUN/Creatinine Ratio: 10 (ref 10–24)
BUN: 10 mg/dL (ref 8–27)
CO2: 27 mmol/L (ref 20–29)
Calcium: 9.1 mg/dL (ref 8.6–10.2)
Chloride: 104 mmol/L (ref 96–106)
Creatinine, Ser: 1.05 mg/dL (ref 0.76–1.27)
GFR calc Af Amer: 75 mL/min/{1.73_m2} (ref >59)
GFR calc non Af Amer: 65 mL/min/{1.73_m2} (ref >59)
GLUCOSE: 88 mg/dL (ref 65–99)
Potassium: 4.2 mmol/L (ref 3.5–5.2)
Sodium: 144 mmol/L (ref 134–144)

## 2017-10-05 LAB — CBC
Hematocrit: 43.1 % (ref 37.5–51.0)
Hemoglobin: 13.8 g/dL (ref 13.0–17.7)
MCH: 29.7 pg (ref 26.6–33.0)
MCHC: 32 g/dL (ref 31.5–35.7)
MCV: 93 fL (ref 79–97)
Platelets: 68 10*3/uL — CL (ref 150–450)
RBC: 4.64 x10E6/uL (ref 4.14–5.80)
RDW: 15.6 % — AB (ref 12.3–15.4)
WBC: 6 10*3/uL (ref 3.4–10.8)

## 2017-10-11 ENCOUNTER — Telehealth: Payer: Self-pay

## 2017-10-11 ENCOUNTER — Other Ambulatory Visit: Payer: Self-pay

## 2017-10-11 DIAGNOSIS — K7460 Unspecified cirrhosis of liver: Secondary | ICD-10-CM

## 2017-10-11 NOTE — Telephone Encounter (Signed)
-----   Message from Algernon Huxley, RN sent at 04/03/2017  9:51 AM EST ----- Regarding: Ultrasound of liver Needs [place] in September

## 2017-10-11 NOTE — Telephone Encounter (Signed)
Pt scheduled for [place] of abd at [facility] 10/23/17@10 :30am, pt to arrive there at 10:15am. Pt to be NPO after midnight. Pts wife aware of appt.

## 2017-10-16 ENCOUNTER — Ambulatory Visit: Payer: Medicare HMO

## 2017-10-16 ENCOUNTER — Ambulatory Visit (INDEPENDENT_AMBULATORY_CARE_PROVIDER_SITE_OTHER): Payer: Medicare HMO

## 2017-10-16 DIAGNOSIS — Z0000 Encounter for general adult medical examination without abnormal findings: Secondary | ICD-10-CM

## 2017-10-16 DIAGNOSIS — Z95810 Presence of automatic (implantable) cardiac defibrillator: Secondary | ICD-10-CM

## 2017-10-16 DIAGNOSIS — Z5181 Encounter for therapeutic drug level monitoring: Secondary | ICD-10-CM | POA: Diagnosis not present

## 2017-10-16 DIAGNOSIS — I5022 Chronic systolic (congestive) heart failure: Secondary | ICD-10-CM | POA: Diagnosis not present

## 2017-10-16 DIAGNOSIS — K55069 Acute infarction of intestine, part and extent unspecified: Secondary | ICD-10-CM

## 2017-10-16 LAB — POCT INR: INR: 2.1 (ref 2.0–3.0)

## 2017-10-16 NOTE — Patient Instructions (Signed)
Description   Continue same dose of coumadin 1 tablet daily except 1/2 tablet on Tuesdays and Saturdays.  Recheck INR in 5 weeks. Call our office if you have any concerns or are placed on any new medications [phone].  keep intake of greens consistent

## 2017-10-16 NOTE — Progress Notes (Signed)
EPIC Encounter for ICM Monitoring  Patient Name: [name] is a 84 y.o. male Date: 10/16/2017 Primary Care Physican: [name], MD Primary Cardiologist:Cooper/Weaver, PA Electrophysiologist: Faustino Congress Weight:188lbs Right Ventricular Pacing 96% and Left Ventricular Pacing 98%                                                         Spoke with wife.  Heart failure questions reviewed.  Transmission reviewed.    Average Respiratory rate 19-21.  Prescribed dosage:Furosemide 40 mg 1 tablet twice a day. Potassium 20 mEq 1 tablet daily  Recommendations:  No changes.  Encouraged to call for fluid symptoms.   Follow-up plan: ICM clinic phone appointment on 12/11/2017.  Office appointment scheduled 11/10/2017 with Dr Caryl Comes.        Copy of ICM check sent to Dr. Caryl Comes.    [place], RN 10/16/2017 9:48 AM

## 2017-10-20 ENCOUNTER — Telehealth: Payer: Self-pay | Admitting: *Deleted

## 2017-10-20 NOTE — Telephone Encounter (Signed)
Spoke to [place] regarding patient's battery alert. Dr.Klein recommended that patient have gen change on 10/23/17 @ 0730 if possible.  Called and spoke to patient's spouse, Kennyth Lose, regarding alert and need for change out. Wife verbalized understanding.  Will forward information to Dollene Primrose, RN for scheduling procedure.   Chanetta Marshall, NP also aware.

## 2017-10-20 NOTE — Telephone Encounter (Signed)
Spoke with pt's wife. Pt has been scheduled for a change out on Sept 23 at 7:30am with Dr Caryl Comes. We reviewed pre procedure and coumadin instructions. A letter has been sent through [place]. Pt's wife states she will call us if she has any additional questions.

## 2017-10-20 NOTE — Telephone Encounter (Signed)
LVM.sss  1003 battery voltage alert.  Plan for changeout on Monday @ 0730 per SK.

## 2017-10-23 ENCOUNTER — Encounter: Payer: Medicare HMO | Admitting: Internal Medicine

## 2017-10-23 ENCOUNTER — Encounter (HOSPITAL_COMMUNITY): Payer: Self-pay | Admitting: Internal Medicine

## 2017-10-23 ENCOUNTER — Encounter (HOSPITAL_COMMUNITY)
Admission: RE | Disposition: A | Discharge status: Home / Self Care | Payer: Self-pay | Source: Ambulatory Visit | Attending: Internal Medicine

## 2017-10-23 ENCOUNTER — Ambulatory Visit (HOSPITAL_COMMUNITY): Payer: Medicare HMO

## 2017-10-23 ENCOUNTER — Ambulatory Visit (HOSPITAL_COMMUNITY)
Admission: RE | Admit: 2017-10-23 | Discharge: 2017-10-23 | Disposition: A | Discharge status: Home / Self Care | Payer: Medicare HMO | Source: Ambulatory Visit | Attending: Internal Medicine | Admitting: Internal Medicine

## 2017-10-23 DIAGNOSIS — M353 Polymyalgia rheumatica: Secondary | ICD-10-CM | POA: Diagnosis not present

## 2017-10-23 DIAGNOSIS — I255 Ischemic cardiomyopathy: Secondary | ICD-10-CM | POA: Diagnosis not present

## 2017-10-23 DIAGNOSIS — E559 Vitamin D deficiency, unspecified: Secondary | ICD-10-CM | POA: Diagnosis not present

## 2017-10-23 DIAGNOSIS — K5090 Crohn's disease, unspecified, without complications: Secondary | ICD-10-CM | POA: Diagnosis not present

## 2017-10-23 DIAGNOSIS — Z9841 Cataract extraction status, right eye: Secondary | ICD-10-CM | POA: Diagnosis not present

## 2017-10-23 DIAGNOSIS — Z9842 Cataract extraction status, left eye: Secondary | ICD-10-CM | POA: Diagnosis not present

## 2017-10-23 DIAGNOSIS — Z8379 Family history of other diseases of the digestive system: Secondary | ICD-10-CM | POA: Insufficient documentation

## 2017-10-23 DIAGNOSIS — I5022 Chronic systolic (congestive) heart failure: Secondary | ICD-10-CM | POA: Diagnosis not present

## 2017-10-23 DIAGNOSIS — Z9889 Other specified postprocedural states: Secondary | ICD-10-CM | POA: Insufficient documentation

## 2017-10-23 DIAGNOSIS — I480 Paroxysmal atrial fibrillation: Secondary | ICD-10-CM | POA: Diagnosis not present

## 2017-10-23 DIAGNOSIS — Z006 Encounter for examination for normal comparison and control in clinical research program: Secondary | ICD-10-CM | POA: Diagnosis not present

## 2017-10-23 DIAGNOSIS — G4733 Obstructive sleep apnea (adult) (pediatric): Secondary | ICD-10-CM | POA: Diagnosis not present

## 2017-10-23 DIAGNOSIS — F419 Anxiety disorder, unspecified: Secondary | ICD-10-CM | POA: Insufficient documentation

## 2017-10-23 DIAGNOSIS — E538 Deficiency of other specified B group vitamins: Secondary | ICD-10-CM | POA: Diagnosis not present

## 2017-10-23 DIAGNOSIS — Z85828 Personal history of other malignant neoplasm of skin: Secondary | ICD-10-CM | POA: Diagnosis not present

## 2017-10-23 DIAGNOSIS — I110 Hypertensive heart disease with heart failure: Secondary | ICD-10-CM | POA: Diagnosis not present

## 2017-10-23 DIAGNOSIS — Z95810 Presence of automatic (implantable) cardiac defibrillator: Secondary | ICD-10-CM

## 2017-10-23 DIAGNOSIS — Z7901 Long term (current) use of anticoagulants: Secondary | ICD-10-CM | POA: Insufficient documentation

## 2017-10-23 DIAGNOSIS — Z951 Presence of aortocoronary bypass graft: Secondary | ICD-10-CM | POA: Insufficient documentation

## 2017-10-23 DIAGNOSIS — Z87891 Personal history of nicotine dependence: Secondary | ICD-10-CM | POA: Insufficient documentation

## 2017-10-23 DIAGNOSIS — Z8249 Family history of ischemic heart disease and other diseases of the circulatory system: Secondary | ICD-10-CM | POA: Insufficient documentation

## 2017-10-23 DIAGNOSIS — I2510 Atherosclerotic heart disease of native coronary artery without angina pectoris: Secondary | ICD-10-CM | POA: Insufficient documentation

## 2017-10-23 DIAGNOSIS — I447 Left bundle-branch block, unspecified: Secondary | ICD-10-CM | POA: Insufficient documentation

## 2017-10-23 DIAGNOSIS — K219 Gastro-esophageal reflux disease without esophagitis: Secondary | ICD-10-CM | POA: Diagnosis not present

## 2017-10-23 DIAGNOSIS — E785 Hyperlipidemia, unspecified: Secondary | ICD-10-CM | POA: Insufficient documentation

## 2017-10-23 DIAGNOSIS — Z9049 Acquired absence of other specified parts of digestive tract: Secondary | ICD-10-CM | POA: Insufficient documentation

## 2017-10-23 DIAGNOSIS — Z86010 Personal history of colonic polyps: Secondary | ICD-10-CM | POA: Insufficient documentation

## 2017-10-23 DIAGNOSIS — R69 Illness, unspecified: Secondary | ICD-10-CM | POA: Diagnosis not present

## 2017-10-23 DIAGNOSIS — Z4502 Encounter for adjustment and management of automatic implantable cardiac defibrillator: Secondary | ICD-10-CM | POA: Diagnosis not present

## 2017-10-23 DIAGNOSIS — Z79899 Other long term (current) drug therapy: Secondary | ICD-10-CM | POA: Insufficient documentation

## 2017-10-23 DIAGNOSIS — Z8619 Personal history of other infectious and parasitic diseases: Secondary | ICD-10-CM | POA: Insufficient documentation

## 2017-10-23 HISTORY — PX: BIV ICD GENERATOR CHANGEOUT: EP1194

## 2017-10-23 LAB — PROTIME-INR
INR: 2.05
PROTHROMBIN TIME: 22.9 s — AB (ref 11.4–15.2)

## 2017-10-23 LAB — SURGICAL PCR SCREEN
MRSA, PCR: NEGATIVE
Staphylococcus aureus: POSITIVE — AB

## 2017-10-23 SURGERY — BIV ICD GENERATOR CHANGEOUT
Anesthesia: LOCAL

## 2017-10-23 MED ORDER — SODIUM CHLORIDE 0.9 % IV SOLN
INTRAVENOUS | Status: DC
  Administered 2017-10-23: 07:00:00 via INTRAVENOUS

## 2017-10-23 MED ORDER — ONDANSETRON HCL 4 MG/2ML IJ SOLN: 4.0000 mg | Freq: Four times a day (QID) | INTRAMUSCULAR | Status: DC | PRN

## 2017-10-23 MED ORDER — FENTANYL CITRATE (PF) 100 MCG/2ML IJ SOLN
INTRAMUSCULAR | Status: AC
  Filled 2017-10-23: qty 2

## 2017-10-23 MED ORDER — MIDAZOLAM HCL 2 MG/2ML IJ SOLN
INTRAMUSCULAR | Status: DC | PRN
  Administered 2017-10-23: 1 mg via INTRAVENOUS

## 2017-10-23 MED ORDER — MUPIROCIN 2 % EX OINT
TOPICAL_OINTMENT | Freq: Once | CUTANEOUS | Status: AC
  Administered 2017-10-23: 07:00:00 via NASAL
  Filled 2017-10-23 (×2): qty 22

## 2017-10-23 MED ORDER — SODIUM CHLORIDE 0.9 % IV SOLN
INTRAVENOUS | Status: AC
  Filled 2017-10-23: qty 2

## 2017-10-23 MED ORDER — ACETAMINOPHEN 325 MG PO TABS
325.0000 mg | ORAL_TABLET | ORAL | Status: DC | PRN
  Filled 2017-10-23: qty 2

## 2017-10-23 MED ORDER — CHLORHEXIDINE GLUCONATE 4 % EX LIQD
60.0000 mL | Freq: Once | CUTANEOUS | Status: DC
  Filled 2017-10-23: qty 60

## 2017-10-23 MED ORDER — CEFAZOLIN SODIUM-DEXTROSE 2-4 GM/100ML-% IV SOLN
INTRAVENOUS | Status: AC
  Filled 2017-10-23: qty 100

## 2017-10-23 MED ORDER — SODIUM CHLORIDE 0.9 % IV SOLN: INTRAVENOUS | Status: AC

## 2017-10-23 MED ORDER — LIDOCAINE HCL (PF) 1 % IJ SOLN
INTRAMUSCULAR | Status: AC
  Filled 2017-10-23: qty 60

## 2017-10-23 MED ORDER — LIDOCAINE HCL (PF) 1 % IJ SOLN
INTRAMUSCULAR | Status: DC | PRN
  Administered 2017-10-23: 45 mL

## 2017-10-23 MED ORDER — MIDAZOLAM HCL 5 MG/5ML IJ SOLN
INTRAMUSCULAR | Status: AC
  Filled 2017-10-23: qty 5

## 2017-10-23 MED ORDER — CEFAZOLIN SODIUM-DEXTROSE 2-4 GM/100ML-% IV SOLN
2.0000 g | INTRAVENOUS | Status: AC
  Administered 2017-10-23: 2 g via INTRAVENOUS
  Filled 2017-10-23: qty 100

## 2017-10-23 MED ORDER — FENTANYL CITRATE (PF) 100 MCG/2ML IJ SOLN
INTRAMUSCULAR | Status: DC | PRN
  Administered 2017-10-23: 25 ug via INTRAVENOUS

## 2017-10-23 MED ORDER — SODIUM CHLORIDE 0.9 % IV SOLN
80.0000 mg | INTRAVENOUS | Status: AC
  Administered 2017-10-23: 80 mg
  Filled 2017-10-23: qty 2

## 2017-10-23 SURGICAL SUPPLY — 5 items
CABLE SURGICAL S-101-97-12 (CABLE) ×1 IMPLANT
HEMOSTAT SURGICEL 2X4 FIBR (HEMOSTASIS) ×1 IMPLANT
ICD MOMENTUM G125 (ICD Generator) ×1 IMPLANT
PAD PRO RADIOLUCENT 2001M-C (PAD) ×1 IMPLANT
TRAY PACEMAKER INSERTION (CUSTOM PROCEDURE TRAY) ×1 IMPLANT

## 2017-10-23 NOTE — H&P (Signed)
Patient Care Team: [name], MD as PCP - General (Family Medicine) Sherren Mocha, MD as PCP - Cardiology (Cardiology) Pyrtle, Lajuan Lines, MD as Consulting Physician (Gastroenterology) Darleen Crocker, MD as Consulting Physician (Ophthalmology) Dorothy Spark, MD as Consulting Physician (Cardiology) Veva Holes as Consulting Physician (Urology) Waynetta Sandy, MD as Consulting Physician (Vascular Surgery) Deboraha Sprang, MD as Consulting Physician (Cardiology)   HPI  [name] is a 84 y.o. male admitted for ICD battery failure and replacement  No complatints of sob or chest pain  Some edema   ICD implanted for primary prevention in the setting of ischemic coronary disease with prior CABG as part of the MADIT-CRT trial. He has paroxysmal Afib anticoagulated with warfarin   His last catheterization in 2006 demonstrated a total right and 80% marginal. Vein graft to the diagonal was occluded and other bypasses were open; ejection fraction 2007 was about 35%  Nuclear scan Oct 2011>>scar no ischemia  Echo 10/16  EF 35-40%      Date Cr K  Hgb  8/18 1.36 3.6 14.1   9/19 1.05 4.2 89.1     Thromboembolic risk factors ( age -27 , HTN-1, Vasc disease -1, CHF-1) for a CHADSVASc Score of 5   Past Medical History:  Diagnosis Date  . AAA (abdominal aortic aneurysm) ([place])   . Abdominal pain 04/14/2011  . Acute bronchitis 09/02/2013  . Acute vascular insufficiency of intestine (HCC)    Mesenteric embolus...surgery 2003  . Allergic rhinitis 09/09/2013  . Anxiety   . Aortic atherosclerosis ([place]) 11/10/2015  . Atrial fibrillation (HCC)    paroxysmal  . Back pain 08/08/2016  . Biventricular implantable cardiac defibrillator in situ    GDT CONTAK H170-MADIT-CRT-EXPLANTED 2011 implanted defibrillator- Guidant cognis, model n119-2001 Dr. Caryl Comes (11/28/2009)  . Breast tenderness    Spironolactone was stopped and eplerinone started. Patient feels better  .  CAD (coronary artery disease)    CABG 1994  /   catheterization 1996, occluded vein graft to the diagonal, other grafts patent  /   catheterization 2006, no PCI  /  nuclear, October, 2011, extensive scar anteroseptal and apical, no ischemia  . Cardiomyopathy, ischemic 09/15/2008   nuclear scan November 23, 2009.  This revealed extensive old scar.  There was no significant ischemia.  Ejection fraction was 34%. Last cath 2006   . Carotid artery disease ([place])    Doppler, February,  2012, 0-39% bilateral, recommended followup 1 year  . CHF (congestive heart failure) (HCC)    chronic systolic  . Chronic systolic heart failure (HCC)    chronic systolic   . Chronotropic incompetence   . Cirrhosis, nonalcoholic ([place]) 6/94/5038  . Colon polyp   . Colonic polyp 2010   pathology not clear.   . COLONIC POLYPS 03/23/2007   Qualifier: Diagnosis of  By: Lenna Gilford MD, Deborra Medina   . Crohn's disease ([place])   . Crohn's disease of large intestine ([place])   . Degenerative joint disease   . Dermatitis 04/05/2016  . Dizziness    Positional dizziness  . Edema 09/02/2013  . Edema leg    Venous Dopplers 8/13: No DVT bilaterally  . Ejection fraction    EF 35%, echo, November, 2011  //   Is 35-40%, echo,  September 23, 2011   . Encounter for therapeutic drug monitoring 03/08/2017  . Fatigue 06/25/2008   Qualifier: Diagnosis of  By: [name], MD, Leonidas Romberg Dorinda Hill   . GERD  02/21/2007   Qualifier: Diagnosis of  By: Ronnald Ramp CNA/MA, Janett Billow    . GERD (gastroesophageal reflux disease)   . Hx of CABG    1994  . Hyperglycemia 08/08/2016  . Hyperlipemia   . Hypertension   . Hypertrophy of prostate with urinary obstruction and other lower urinary tract symptoms (LUTS)   . Hypoalbuminemia 06/16/2013  . Hypokalemia 06/18/2013  . Implantable cardioverter-defibrillator (ICD) in situ 12/09/2009   Annotation: EXPLANTED 2011   //   New Guidant ICD implanted 2011     . Iron deficiency anemia   . Ischemic cardiomyopathy    CABG 1994 CAD  catherization 1996.[name] occluded vein graft to the diagnol, other grafts patent/  catherization 2006, no PCI/ nuclear.[name] october 2011.[name] extensive scar anteroseptal and apical.. no ischemia    . LBBB (left bundle branch block)   . Left ankle pain 06/07/2013   The patient is having pain in his left ankle, May, 2015   . MCI (mild cognitive impairment) 02/27/2017  . 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 Mitral regurgitation    mild.[name] echo november 2011  EF 35%...echo.. november 2009/ ef 35%.[name] echo november 2011  . Oral thrush 02/05/2015  . OSA (obstructive sleep apnea) 09/16/2011   NPSG 2013:  AHI 34/hr.    . Polymyalgia rheumatica ([place])   . Post viral syndrome 02/27/2017  . Preventative health care 10/17/2016  . Shingles   . Sinus bradycardia   . Skin cancer 08/08/2016  . Thrombocytopenia ([place]) 2008  . Thrombocytopenia ([place]) 11/30/2011  . Thrush   . Tremor   . Ventral hernia    multiple, wears an abd binder, reports he hs been told he was at increased riisk for surgery  . Vitamin B 12 deficiency 11/06/2008   Qualifier: Diagnosis of  By: Lenna Gilford MD, Deborra Medina   . Vitamin B12 deficiency   . Vitamin D deficiency 11/06/2008   Qualifier: Diagnosis of  By: Lenna Gilford MD, Deborra Medina   . Warfarin anticoagulation    Coumadin therapy for mesenteric embolus 2003    Past Surgical History:  Procedure Laterality Date  . CATARACT EXTRACTION, BILATERAL  2014  . COLONOSCOPY N/A 04/19/2013   Procedure: COLONOSCOPY;  Surgeon: Jerene Bears, MD;  Location: [facility] ENDOSCOPY;  Service: Endoscopy;  Laterality: N/A;  . CORONARY ARTERY BYPASS GRAFT  1994  . Elap w/ superior mesenteric artery embolectomy  1/03   by Dr. Jennette Banker  . ESOPHAGOGASTRODUODENOSCOPY N/A 04/18/2013   Procedure: ESOPHAGOGASTRODUODENOSCOPY (EGD);  Surgeon: Jerene Bears, MD;  Location: [facility] ENDOSCOPY;  Service: Endoscopy;  Laterality: N/A;  . implantation of biventric cardioverter-defibrillatorr     by Dr. Lovena Le in 8/06 Guidant Contak H170-explanted 2011  .  implantation of guidant cognis device     model n119-2011  . laparoscopic cholecystectomy  2002  . left inguinal hernia repair with mesh  6/08   by Dr. Betsy Pries  . PROSTATE SURGERY  08/2012   by urology in [place]  . Repair of incarcerated ventral hernia and lysis of adhesions  11/03   by Dr. Betsy Pries    Current Facility-Administered Medications  Medication Dose Route Frequency Provider Last Rate Last Dose  . 0.9 %  sodium chloride infusion   Intravenous Continuous Deboraha Sprang, MD 50 mL/hr at 10/23/17 0631    . ceFAZolin (ANCEF) IVPB 2g/100 mL premix  2 g Intravenous On Call Deboraha Sprang, MD      . chlorhexidine (HIBICLENS) 4 % liquid 4 application  60 mL Topical Once Caryl Comes,  Revonda Standard, MD      . gentamicin (GARAMYCIN) 80 mg in sodium chloride 0.9 % 500 mL irrigation  80 mg Irrigation On Call Deboraha Sprang, MD        No Known Allergies    Social History   Tobacco Use  . Smoking status: Former Smoker    Packs/day: 1.00    Years: 20.00    Pack years: 20.00    Types: Cigarettes    Last attempt to quit: 01/31/1973    Years since quitting: 44.7  . Smokeless tobacco: Never Used  Substance Use Topics  . Alcohol use: Yes    Alcohol/week: 0.0 standard drinks    Comment: occasional  . Drug use: No     Family History  Problem Relation Age of Onset  . Heart disease Mother   . Tremor Sister   . Heart failure Sister   . Heart disease Father 30  . Hypertension Father   . Appendicitis Brother        ruptured  . Diabetes Son   . Heart disease Sister   . Hypertension Sister   . Dementia Sister   . Heart disease Sister   . Hypertension Brother   . Kidney disease Brother   . Myasthenia gravis Brother   . Cirrhosis Brother   . Leukemia Brother   . Hypertension Unknown   . Hyperlipidemia Unknown   . Heart disease Sister   . Edema Sister        pedal edema     Current Meds  Medication Sig  . carvedilol (COREG) 6.25 MG tablet TAKE 1 TABLET BY MOUTH TWICE DAILY WITH  MEALS (Patient taking differently: Take 6.25 mg by mouth 2 (two) times daily with a meal. )  . cholecalciferol (VITAMIN D) 1000 UNITS tablet Take 1,000 Units by mouth daily.   [name] donepezil (ARICEPT) 10 MG tablet Take 1 tablet (10 mg total) by mouth at bedtime.  . furosemide (LASIX) 40 MG tablet Take 1 tablet (40 mg total) by mouth 2 (two) times daily. Please keep upcoming appt in September before anymore refills. Thank you (Patient taking differently: Take 40 mg by mouth daily. Please keep upcoming appt in September before anymore refills. Thank you)  . hydrocortisone cream 1 % Apply 1 application topically daily as needed for itching.  [name] lisinopril (PRINIVIL,ZESTRIL) 10 MG tablet TAKE 1 TABLET BY MOUTH ONCE DAILY (Patient taking differently: Take 10 mg by mouth daily. )  . oxymetazoline (AFRIN) 0.05 % nasal spray Place 1 spray into both nostrils daily as needed for congestion.  . pantoprazole (PROTONIX) 40 MG tablet TAKE 1 TABLET BY MOUTH ONCE DAILY AT  12  NOON (Patient taking differently: Take 40 mg by mouth daily. TAKE 1 TABLET BY MOUTH ONCE DAILY AT  12  NOON)  . potassium chloride SA (KLOR-CON M20) 20 MEQ tablet Take 1 tablet (20 mEq total) by mouth daily.  . simvastatin (ZOCOR) 20 MG tablet TAKE 1 TABLET BY MOUTH AT BEDTIME (Patient taking differently: Take 20 mg by mouth at bedtime. )  . vitamin B-12 (CYANOCOBALAMIN) 1000 MCG tablet Take 1,000 mcg by mouth daily.   [name] warfarin (COUMADIN) 5 MG tablet TAKE AS DIRECTED BY  COUMADIN  CLINIC (Patient taking differently: Take 2.5-5 mg by mouth See admin instructions. Take 48m by mouth daily except Saturday take 2.54m     Review of Systems negative except from HPI and PMH  Physical Exam BP 138/70   Pulse 60   Temp  97.7 F (36.5 C) (Oral)   Resp 18   Ht 6' (1.829 m)   Wt 84.4 kg   SpO2 98%   BMI 25.23 kg/m  Well developed and well nourished in no acute distress HENT normal E scleral and icterus clear Neck Supple JVP flat; carotids  brisk and full Device pocket well healed; without hematoma or erythema.  There is no tethering  Clear to ausculation Regular rate and rhythm, no murmurs gallops or rub Soft with active bowel sounds No clubbing cyanosis  Edema Alert and oriented, grossly normal motor and sensory function Skin Warm and Dry    Assessment and  Plan Ischemic cardiomyopathy  Implantable defibrillator-[employer] CRT --Batteryfailure  Congestive heart-chronic-systolic    We have reviewed the benefits and risks of generator replacement.  These include but are not limited to lead fracture and infection.  The patient understands, agrees and is willing to proceed.     We have discussed ICD and pacer function replacement,  He has elected to pursue BIV CD replacement with process of shared decision making

## 2017-10-23 NOTE — Progress Notes (Signed)
Pt ambulated to BR, standby assist. Tolerated ambulation and voided without difficulty. Site remains unremarkable.

## 2017-10-23 NOTE — H&P (Signed)
Patient Care Team: [name], MD as PCP - General (Family Medicine) Sherren Mocha, MD as PCP - Cardiology (Cardiology) Pyrtle, Lajuan Lines, MD as Consulting Physician (Gastroenterology) Darleen Crocker, MD as Consulting Physician (Ophthalmology) Dorothy Spark, MD as Consulting Physician (Cardiology) Veva Holes as Consulting Physician (Urology) Waynetta Sandy, MD as Consulting Physician (Vascular Surgery) Deboraha Sprang, MD as Consulting Physician (Cardiology)   HPI  [name] is a 84 y.o. male admitted for ICD battery failure and replacement   ICD implanted for primary prevention in the setting of ischemic coronary disease with prior CABG as part of the MADIT-CRT trial. He has paroxysmal Afib anticoagulated with warfarin   His last catheterization in 2006 demonstrated a total right and 80% marginal. Vein graft to the diagonal was occluded and other bypasses were open; ejection fraction 2007 was about 35%  Nuclear scan Oct 2011>>scar no ischemia  Echo 10/16  EF 35-40%      Date Cr K  Hgb  8/18 1.36 3.6 14.1   9/19 1.05 4.2 40.1     Thromboembolic risk factors ( age -48 , HTN-1, Vasc disease -1, CHF-1) for a CHADSVASc Score of 5   Past Medical History:  Diagnosis Date  . AAA (abdominal aortic aneurysm) ([place])   . Abdominal pain 04/14/2011  . Acute bronchitis 09/02/2013  . Acute vascular insufficiency of intestine (HCC)    Mesenteric embolus...surgery 2003  . Allergic rhinitis 09/09/2013  . Anxiety   . Aortic atherosclerosis ([place]) 11/10/2015  . Atrial fibrillation (HCC)    paroxysmal  . Back pain 08/08/2016  . Biventricular implantable cardiac defibrillator in situ    GDT CONTAK H170-MADIT-CRT-EXPLANTED 2011 implanted defibrillator- Guidant cognis, model n119-2001 Dr. Caryl Comes (11/28/2009)  . Breast tenderness    Spironolactone was stopped and eplerinone started. Patient feels better  . CAD (coronary artery disease)    CABG 1994  /    catheterization 1996, occluded vein graft to the diagonal, other grafts patent  /   catheterization 2006, no PCI  /  nuclear, October, 2011, extensive scar anteroseptal and apical, no ischemia  . Cardiomyopathy, ischemic 09/15/2008   nuclear scan November 23, 2009.  This revealed extensive old scar.  There was no significant ischemia.  Ejection fraction was 34%. Last cath 2006   . Carotid artery disease ([place])    Doppler, February,  2012, 0-39% bilateral, recommended followup 1 year  . CHF (congestive heart failure) (HCC)    chronic systolic  . Chronic systolic heart failure (HCC)    chronic systolic   . Chronotropic incompetence   . Cirrhosis, nonalcoholic ([place]) 0/27/2536  . Colon polyp   . Colonic polyp 2010   pathology not clear.   . COLONIC POLYPS 03/23/2007   Qualifier: Diagnosis of  By: Lenna Gilford MD, Deborra Medina   . Crohn's disease ([place])   . Crohn's disease of large intestine ([place])   . Degenerative joint disease   . Dermatitis 04/05/2016  . Dizziness    Positional dizziness  . Edema 09/02/2013  . Edema leg    Venous Dopplers 8/13: No DVT bilaterally  . Ejection fraction    EF 35%, echo, November, 2011  //   Is 35-40%, echo,  September 23, 2011   . Encounter for therapeutic drug monitoring 03/08/2017  . Fatigue 06/25/2008   Qualifier: Diagnosis of  By: [name], MD, Leonidas Romberg Dorinda Hill   . GERD 02/21/2007   Qualifier: Diagnosis of  By: Ronnald Ramp CNA/MA, Janett Billow    .  GERD (gastroesophageal reflux disease)   . Hx of CABG    1994  . Hyperglycemia 08/08/2016  . Hyperlipemia   . Hypertension   . Hypertrophy of prostate with urinary obstruction and other lower urinary tract symptoms (LUTS)   . Hypoalbuminemia 06/16/2013  . Hypokalemia 06/18/2013  . Implantable cardioverter-defibrillator (ICD) in situ 12/09/2009   Annotation: EXPLANTED 2011   //   New Guidant ICD implanted 2011     . Iron deficiency anemia   . Ischemic cardiomyopathy    CABG 1994 CAD catherization 1996.[name] occluded vein graft to the diagnol,  other grafts patent/  catherization 2006, no PCI/ nuclear.[name] october 2011.[name] extensive scar anteroseptal and apical.. no ischemia    . LBBB (left bundle branch block)   . Left ankle pain 06/07/2013   The patient is having pain in his left ankle, May, 2015   . MCI (mild cognitive impairment) 02/27/2017  . 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 Mitral regurgitation    mild.[name] echo november 2011  EF 35%...echo.. november 2009/ ef 35%.[name] echo november 2011  . Oral thrush 02/05/2015  . OSA (obstructive sleep apnea) 09/16/2011   NPSG 2013:  AHI 34/hr.    . Polymyalgia rheumatica ([place])   . Post viral syndrome 02/27/2017  . Preventative health care 10/17/2016  . Shingles   . Sinus bradycardia   . Skin cancer 08/08/2016  . Thrombocytopenia ([place]) 2008  . Thrombocytopenia ([place]) 11/30/2011  . Thrush   . Tremor   . Ventral hernia    multiple, wears an abd binder, reports he hs been told he was at increased riisk for surgery  . Vitamin B 12 deficiency 11/06/2008   Qualifier: Diagnosis of  By: Lenna Gilford MD, Deborra Medina   . Vitamin B12 deficiency   . Vitamin D deficiency 11/06/2008   Qualifier: Diagnosis of  By: Lenna Gilford MD, Deborra Medina   . Warfarin anticoagulation    Coumadin therapy for mesenteric embolus 2003    Past Surgical History:  Procedure Laterality Date  . CATARACT EXTRACTION, BILATERAL  2014  . COLONOSCOPY N/A 04/19/2013   Procedure: COLONOSCOPY;  Surgeon: Jerene Bears, MD;  Location: [facility] ENDOSCOPY;  Service: Endoscopy;  Laterality: N/A;  . CORONARY ARTERY BYPASS GRAFT  1994  . Elap w/ superior mesenteric artery embolectomy  1/03   by Dr. Jennette Banker  . ESOPHAGOGASTRODUODENOSCOPY N/A 04/18/2013   Procedure: ESOPHAGOGASTRODUODENOSCOPY (EGD);  Surgeon: Jerene Bears, MD;  Location: [facility] ENDOSCOPY;  Service: Endoscopy;  Laterality: N/A;  . implantation of biventric cardioverter-defibrillatorr     by Dr. Lovena Le in 8/06 Guidant Contak H170-explanted 2011  . implantation of guidant cognis device     model n119-2011  .  laparoscopic cholecystectomy  2002  . left inguinal hernia repair with mesh  6/08   by Dr. Betsy Pries  . PROSTATE SURGERY  08/2012   by urology in [place]  . Repair of incarcerated ventral hernia and lysis of adhesions  11/03   by Dr. Betsy Pries    Current Facility-Administered Medications  Medication Dose Route Frequency Provider Last Rate Last Dose  . 0.9 %  sodium chloride infusion   Intravenous Continuous Deboraha Sprang, MD 50 mL/hr at 10/23/17 0631    . ceFAZolin (ANCEF) IVPB 2g/100 mL premix  2 g Intravenous On Call Deboraha Sprang, MD      . chlorhexidine (HIBICLENS) 4 % liquid 4 application  60 mL Topical Once Deboraha Sprang, MD      . gentamicin (GARAMYCIN) 80 mg in sodium  chloride 0.9 % 500 mL irrigation  80 mg Irrigation On Call Deboraha Sprang, MD        No Known Allergies    Social History   Tobacco Use  . Smoking status: Former Smoker    Packs/day: 1.00    Years: 20.00    Pack years: 20.00    Types: Cigarettes    Last attempt to quit: 01/31/1973    Years since quitting: 44.7  . Smokeless tobacco: Never Used  Substance Use Topics  . Alcohol use: Yes    Alcohol/week: 0.0 standard drinks    Comment: occasional  . Drug use: No     Family History  Problem Relation Age of Onset  . Heart disease Mother   . Tremor Sister   . Heart failure Sister   . Heart disease Father 71  . Hypertension Father   . Appendicitis Brother        ruptured  . Diabetes Son   . Heart disease Sister   . Hypertension Sister   . Dementia Sister   . Heart disease Sister   . Hypertension Brother   . Kidney disease Brother   . Myasthenia gravis Brother   . Cirrhosis Brother   . Leukemia Brother   . Hypertension Unknown   . Hyperlipidemia Unknown   . Heart disease Sister   . Edema Sister        pedal edema     Current Meds  Medication Sig  . carvedilol (COREG) 6.25 MG tablet TAKE 1 TABLET BY MOUTH TWICE DAILY WITH MEALS (Patient taking differently: Take 6.25 mg by mouth 2 (two)  times daily with a meal. )  . cholecalciferol (VITAMIN D) 1000 UNITS tablet Take 1,000 Units by mouth daily.   [name] donepezil (ARICEPT) 10 MG tablet Take 1 tablet (10 mg total) by mouth at bedtime.  . furosemide (LASIX) 40 MG tablet Take 1 tablet (40 mg total) by mouth 2 (two) times daily. Please keep upcoming appt in September before anymore refills. Thank you (Patient taking differently: Take 40 mg by mouth daily. Please keep upcoming appt in September before anymore refills. Thank you)  . hydrocortisone cream 1 % Apply 1 application topically daily as needed for itching.  [name] lisinopril (PRINIVIL,ZESTRIL) 10 MG tablet TAKE 1 TABLET BY MOUTH ONCE DAILY (Patient taking differently: Take 10 mg by mouth daily. )  . oxymetazoline (AFRIN) 0.05 % nasal spray Place 1 spray into both nostrils daily as needed for congestion.  . pantoprazole (PROTONIX) 40 MG tablet TAKE 1 TABLET BY MOUTH ONCE DAILY AT  12  NOON (Patient taking differently: Take 40 mg by mouth daily. TAKE 1 TABLET BY MOUTH ONCE DAILY AT  12  NOON)  . potassium chloride SA (KLOR-CON M20) 20 MEQ tablet Take 1 tablet (20 mEq total) by mouth daily.  . simvastatin (ZOCOR) 20 MG tablet TAKE 1 TABLET BY MOUTH AT BEDTIME (Patient taking differently: Take 20 mg by mouth at bedtime. )  . vitamin B-12 (CYANOCOBALAMIN) 1000 MCG tablet Take 1,000 mcg by mouth daily.   [name] warfarin (COUMADIN) 5 MG tablet TAKE AS DIRECTED BY  COUMADIN  CLINIC (Patient taking differently: Take 2.5-5 mg by mouth See admin instructions. Take 11m by mouth daily except Saturday take 2.547m     Review of Systems negative except from HPI and PMH  Physical Exam BP 138/70   Pulse 60   Temp 97.7 F (36.5 C) (Oral)   Resp 18   Ht 6' (1.829 m)  Wt 84.4 kg   SpO2 98%   BMI 25.23 kg/m  Well developed and well nourished in no acute distress HENT normal E scleral and icterus clear Neck Supple JVP flat; carotids brisk and full Clear to ausculation Regular rate and rhythm, no  murmurs gallops or rub Soft with active bowel sounds No clubbing cyanosis  Edema Alert and oriented, grossly normal motor and sensory function Skin Warm and Dry    Assessment and  Plan Ischemic cardiomyopathy  Implantable defibrillator-[employer] CRT --Batteryfailure  Congestive heart-chronic-systolic

## 2017-10-23 NOTE — Discharge Instructions (Signed)
Pacemaker Battery Change, Care After  Dermabond (glue on skin) and steri-strips (tape) will come off in next 10-14 days. If not, will remove at wound check. Keep wound dry until tomorrow evening. No driving for 4 days. Wound check in office as scheduled.   This sheet gives you information about how to care for yourself after your procedure. Your health care provider may also give you more specific instructions. If you have problems or questions, contact your health care provider. What can I expect after the procedure? After your procedure, it is common to have:  Pain or soreness at the site where the pacemaker was inserted.  Swelling at the site where the pacemaker was inserted.  Follow these instructions at home: Incision care  Keep the incision clean and dry. ? Do not take baths, swim, or use a hot tub until your health care provider approves. ? You may shower the day after your procedure, or as directed by your health care provider. ? Pat the area dry with a clean towel. Do not rub the area. This may cause bleeding.  Follow instructions from your health care provider about how to take care of your incision. Make sure you: ? Wash your hands with soap and water before you change your bandage (dressing). If soap and water are not available, use hand sanitizer. ? Change your dressing as told by your health care provider. ? Leave stitches (sutures), skin glue, or adhesive strips in place. These skin closures may need to stay in place for 2 weeks or longer. If adhesive strip edges start to loosen and curl up, you may trim the loose edges. Do not remove adhesive strips completely unless your health care provider tells you to do that.  Check your incision area every day for signs of infection. Check for: ? More redness, swelling, or pain. ? More fluid or blood. ? Warmth. ? Pus or a bad smell. Activity  Do not lift anything that is heavier than 10 lb (4.5 kg) until your health care  provider says it is okay to do so.  For the first 2 weeks, or as long as told by your health care provider: ? Avoid lifting your left arm higher than your shoulder. ? Be gentle when you move your arms over your head. It is okay to raise your arm to comb your hair. ? Avoid strenuous exercise.  Ask your health care provider when it is okay to: ? Resume your normal activities. ? Return to work or school. ? Resume sexual activity. Eating and drinking  Eat a heart-healthy diet. This should include plenty of fresh fruits and vegetables, whole grains, low-fat dairy products, and lean protein like chicken and fish.  Limit alcohol intake to no more than 1 drink a day for non-pregnant women and 2 drinks a day for men. One drink equals 12 oz of beer, 5 oz of wine, or 1 oz of hard liquor.  Check ingredients and nutrition facts on packaged foods and beverages. Avoid the following types of food: ? Food that is high in salt (sodium). ? Food that is high in saturated fat, like full-fat dairy or red meat. ? Food that is high in trans fat, like fried food. ? Food and drinks that are high in sugar. Lifestyle  Do not use any products that contain nicotine or tobacco, such as cigarettes and e-cigarettes. If you need help quitting, ask your health care provider.  Take steps to manage and control your weight.  Get regular  exercise. Aim for 150 minutes of moderate-intensity exercise (such as walking or yoga) or 75 minutes of vigorous exercise (such as running or swimming) each week.  Manage other health problems, such as diabetes or high blood pressure. Ask your health care provider how you can manage these conditions. General instructions  Do not drive for 24 hours after your procedure if you were given a medicine to help you relax (sedative).  Take over-the-counter and prescription medicines only as told by your health care provider.  Avoid putting pressure on the area where the pacemaker was  placed.  If you need an MRI after your pacemaker has been placed, be sure to tell the health care provider who orders the MRI that you have a pacemaker.  Avoid close and prolonged exposure to electrical devices that have strong magnetic fields. These include: ? Cell phones. Avoid keeping them in a pocket near the pacemaker, and try using the ear opposite the pacemaker. ? MP3 players. ? Household appliances, like microwaves. ? Metal detectors. ? Electric generators. ? High-tension wires.  Keep all follow-up visits as directed by your health care provider. This is important. Contact a health care provider if:  You have pain at the incision site that is not relieved by over-the-counter or prescription medicines.  You have any of these around your incision site or coming from it: ? More redness, swelling, or pain. ? Fluid or blood. ? Warmth to the touch. ? Pus or a bad smell.  You have a fever.  You feel brief, occasional palpitations, light-headedness, or any symptoms that you think might be related to your heart. Get help right away if:  You experience chest pain that is different from the pain at the pacemaker site.  You develop a red streak that extends above or below the incision site.  You experience shortness of breath.  You have palpitations or an irregular heartbeat.  You have light-headedness that does not go away quickly.  You faint or have dizzy spells.  Your pulse suddenly drops or increases rapidly and does not return to normal.  You begin to gain weight and your legs and ankles swell. Summary  After your procedure, it is common to have pain, soreness, and some swelling where the pacemaker was inserted.  Make sure to keep your incision clean and dry. Follow instructions from your health care provider about how to take care of your incision.  Check your incision every day for signs of infection, such as more pain or swelling, pus or a bad smell, warmth, or  leaking fluid and blood.  Avoid strenuous exercise and lifting your left arm higher than your shoulder for 2 weeks, or as long as told by your health care provider. This information is not intended to replace advice given to you by your health care provider. Make sure you discuss any questions you have with your health care provider. Document Released: 11/07/2012 Document Revised: 12/10/2015 Document Reviewed: 12/10/2015 Elsevier Interactive Patient Education  2017 [employer].

## 2017-10-24 ENCOUNTER — Encounter: Payer: Self-pay | Admitting: Internal Medicine

## 2017-10-24 NOTE — Progress Notes (Signed)
ICD Criteria  Current LVEF:33%. Within 12 months prior to implant: No   Heart failure history: Yes, Class II  Cardiomyopathy history: Yes, Ischemic Cardiomyopathy - Prior MI.  Atrial Fibrillation/Atrial Flutter: Yes, Paroxysmal.  Ventricular tachycardia history: No.  Cardiac arrest history: No.  History of syndromes with risk of sudden death: No.  Previous ICD: Yes, Reason for ICD:  Primary prevention.  Current ICD indication: Primary  PPM indication: No.  Class I or II Bradycardia indication present: No  Beta Blocker therapy for 3 or more months: Yes, prescribed.   Ace Inhibitor/ARB therapy for 3 or more months: Yes, prescribed.

## 2017-10-30 ENCOUNTER — Ambulatory Visit (HOSPITAL_COMMUNITY)
Admission: RE | Admit: 2017-10-30 | Discharge: 2017-10-30 | Disposition: A | Discharge status: Home / Self Care | Payer: Medicare HMO | Source: Ambulatory Visit | Attending: Internal Medicine | Admitting: Internal Medicine

## 2017-10-30 DIAGNOSIS — R9389 Abnormal findings on diagnostic imaging of other specified body structures: Secondary | ICD-10-CM | POA: Insufficient documentation

## 2017-10-30 DIAGNOSIS — Z9049 Acquired absence of other specified parts of digestive tract: Secondary | ICD-10-CM | POA: Diagnosis not present

## 2017-10-30 DIAGNOSIS — K7460 Unspecified cirrhosis of liver: Secondary | ICD-10-CM | POA: Insufficient documentation

## 2017-10-30 DIAGNOSIS — R161 Splenomegaly, not elsewhere classified: Secondary | ICD-10-CM | POA: Diagnosis not present

## 2017-10-30 DIAGNOSIS — R188 Other ascites: Secondary | ICD-10-CM | POA: Diagnosis not present

## 2017-10-30 DIAGNOSIS — C220 Liver cell carcinoma: Secondary | ICD-10-CM | POA: Diagnosis not present

## 2017-10-31 ENCOUNTER — Telehealth: Payer: Self-pay

## 2017-10-31 ENCOUNTER — Telehealth: Payer: Self-pay | Admitting: Internal Medicine

## 2017-10-31 NOTE — Telephone Encounter (Signed)
No message needed °

## 2017-10-31 NOTE — Telephone Encounter (Signed)
Spoke with pts wife regarding pain from pts incision site s/p gen change of CRTD, pts wife stated that last week the pain was pretty bad but had gotten better pts wife had lost paperwork regarding DC instructions from the hospital and arm movements. Informed pts wife that he should be moving that arm some so to avoid frozen shoulder. Informed pts wfie that pt needed an apt to check the incision site and device, pts wife stated that they couldn't come on Thursday. Pt wife asked about Oct. 23 informed her that would be too late and the site needed to be checked within two weeks of implant. Pts wife agreeable to apt on 11/06/2017 at 9:00am.

## 2017-11-06 ENCOUNTER — Ambulatory Visit (INDEPENDENT_AMBULATORY_CARE_PROVIDER_SITE_OTHER): Payer: Medicare HMO | Admitting: *Deleted

## 2017-11-06 DIAGNOSIS — I447 Left bundle-branch block, unspecified: Secondary | ICD-10-CM

## 2017-11-06 DIAGNOSIS — I255 Ischemic cardiomyopathy: Secondary | ICD-10-CM | POA: Diagnosis not present

## 2017-11-06 LAB — CUP PACEART INCLINIC DEVICE CHECK
Brady Statistic RA Percent Paced: 95 %
Brady Statistic RV Percent Paced: 96 %
Date Time Interrogation Session: 20191007040000
HighPow Impedance: 39 Ohm
Implantable Lead Implant Date: 20060808
Implantable Lead Location: 753858
Implantable Lead Model: 4087
Implantable Lead Serial Number: 165392
Lead Channel Impedance Value: 351 Ohm
Lead Channel Impedance Value: 420 Ohm
Lead Channel Pacing Threshold Amplitude: 0.8 V
Lead Channel Pacing Threshold Pulse Width: 0.4 ms
Lead Channel Pacing Threshold Pulse Width: 0.8 ms
Lead Channel Sensing Intrinsic Amplitude: 3.7 mV
Lead Channel Setting Pacing Amplitude: 2.3 V
Lead Channel Setting Pacing Pulse Width: 0.4 ms
Lead Channel Setting Pacing Pulse Width: 0.8 ms
Lead Channel Setting Sensing Sensitivity: 0.5 mV
Lead Channel Setting Sensing Sensitivity: 1 mV
MDC IDC LEAD IMPLANT DT: 20060808
MDC IDC LEAD IMPLANT DT: 20060911
MDC IDC LEAD LOCATION: 753859
MDC IDC LEAD LOCATION: 753860
MDC IDC LEAD SERIAL: 261368
MDC IDC MSMT LEADCHNL RA PACING THRESHOLD AMPLITUDE: 1.3 V
MDC IDC MSMT LEADCHNL RV IMPEDANCE VALUE: 442 Ohm
MDC IDC MSMT LEADCHNL RV PACING THRESHOLD AMPLITUDE: 1 V
MDC IDC MSMT LEADCHNL RV PACING THRESHOLD PULSEWIDTH: 0.4 ms
MDC IDC PG IMPLANT DT: 20190923
MDC IDC PG SERIAL: 134188
MDC IDC SET LEADCHNL LV PACING AMPLITUDE: 2 V
MDC IDC SET LEADCHNL RV PACING AMPLITUDE: 2.4 V

## 2017-11-06 NOTE — Progress Notes (Signed)
Wound CRT-D device check in office. Steri-strips removed prior to OV. Wound without redness or edema. Incision edges approximated, wound well healed. Normal device function. Thresholds, sensing, and impedances consistent with implant measurements No mode switch episodes recorded. No ventricular arrhythmia episodes recorded. Patient bi-ventricularly pacing 96% of the time. Device programmed with appropriate safety margins. Heart failure diagnostics reviewed and trends are stable for patient. No changes made this session. Estimated longevity 11 years.  ROV w/ SK 02/09/2017. Pt and wife educated about wound care and shock plan

## 2017-11-10 ENCOUNTER — Encounter: Payer: Medicare HMO | Admitting: Internal Medicine

## 2017-11-13 ENCOUNTER — Ambulatory Visit (INDEPENDENT_AMBULATORY_CARE_PROVIDER_SITE_OTHER): Payer: Medicare HMO | Admitting: *Deleted

## 2017-11-13 DIAGNOSIS — I255 Ischemic cardiomyopathy: Secondary | ICD-10-CM

## 2017-11-13 NOTE — Progress Notes (Signed)
Remote ICD transmission.   

## 2017-11-16 ENCOUNTER — Encounter: Payer: Self-pay | Admitting: Cardiology

## 2017-11-16 ENCOUNTER — Other Ambulatory Visit: Payer: Self-pay | Admitting: Cardiology

## 2017-11-22 ENCOUNTER — Ambulatory Visit: Payer: Medicare HMO | Admitting: Pharmacist

## 2017-11-22 DIAGNOSIS — K55069 Acute infarction of intestine, part and extent unspecified: Secondary | ICD-10-CM

## 2017-11-22 DIAGNOSIS — Z5181 Encounter for therapeutic drug level monitoring: Secondary | ICD-10-CM | POA: Diagnosis not present

## 2017-11-22 DIAGNOSIS — Z0000 Encounter for general adult medical examination without abnormal findings: Secondary | ICD-10-CM

## 2017-11-22 LAB — POCT INR: INR: 2.2 (ref 2.0–3.0)

## 2017-11-22 NOTE — Patient Instructions (Signed)
Description   Continue same dose of coumadin 1 tablet daily except 1/2 tablet on Tuesdays and Saturdays.  Recheck INR in 6 weeks. Call our office if you have any concerns or are placed on any new medications [phone].  keep intake of greens consistent

## 2017-11-23 ENCOUNTER — Encounter: Payer: Self-pay | Admitting: Internal Medicine

## 2017-11-23 NOTE — Progress Notes (Signed)
ICD Criteria  Current LVEF:33%. Within 12 months prior to implant: No   Heart failure history: Yes, Class II  Cardiomyopathy history: Yes, Ischemic Cardiomyopathy - Prior MI.  Atrial Fibrillation/Atrial Flutter: Yes, Persistent (> 7 Days).  Ventricular tachycardia history: No.  Cardiac arrest history: No.  History of syndromes with risk of sudden death: No.  Previous ICD: Yes, Reason for ICD:  Primary prevention.  Current ICD indication: Primary  PPM indication: No.  Class I or II Bradycardia indication present: No  Beta Blocker therapy for 3 or more months: Yes, prescribed.   Ace Inhibitor/ARB therapy for 3 or more months: Yes, prescribed.

## 2017-12-05 DIAGNOSIS — J984 Other disorders of lung: Secondary | ICD-10-CM | POA: Diagnosis not present

## 2017-12-06 LAB — CUP PACEART REMOTE DEVICE CHECK
Date Time Interrogation Session: 20191106140940
Implantable Lead Implant Date: 20060808
Implantable Lead Implant Date: 20060808
Implantable Lead Location: 753858
Implantable Lead Location: 753859
Implantable Lead Model: 158
Implantable Lead Serial Number: 165392
Implantable Lead Serial Number: 261368
MDC IDC LEAD IMPLANT DT: 20060911
MDC IDC LEAD LOCATION: 753860
MDC IDC PG IMPLANT DT: 20190923
MDC IDC PG SERIAL: 134188

## 2017-12-07 ENCOUNTER — Ambulatory Visit (INDEPENDENT_AMBULATORY_CARE_PROVIDER_SITE_OTHER)
Admission: RE | Admit: 2017-12-07 | Discharge: 2017-12-07 | Disposition: A | Discharge status: Home / Self Care | Payer: Medicare HMO | Source: Ambulatory Visit | Attending: Pulmonary Disease | Admitting: Pulmonary Disease

## 2017-12-07 ENCOUNTER — Ambulatory Visit (INDEPENDENT_AMBULATORY_CARE_PROVIDER_SITE_OTHER): Payer: Medicare HMO

## 2017-12-07 ENCOUNTER — Encounter: Payer: Self-pay | Admitting: Pulmonary Disease

## 2017-12-07 ENCOUNTER — Ambulatory Visit: Payer: Medicare HMO | Admitting: Pulmonary Disease

## 2017-12-07 ENCOUNTER — Other Ambulatory Visit: Payer: Medicare HMO

## 2017-12-07 VITALS — BP 118/64 | HR 77 | Temp 97.7°F | Ht 73.0 in | Wt 188.4 lb

## 2017-12-07 DIAGNOSIS — I5022 Chronic systolic (congestive) heart failure: Secondary | ICD-10-CM | POA: Diagnosis not present

## 2017-12-07 DIAGNOSIS — Z7901 Long term (current) use of anticoagulants: Secondary | ICD-10-CM

## 2017-12-07 DIAGNOSIS — R600 Localized edema: Secondary | ICD-10-CM

## 2017-12-07 DIAGNOSIS — I2510 Atherosclerotic heart disease of native coronary artery without angina pectoris: Secondary | ICD-10-CM | POA: Diagnosis not present

## 2017-12-07 DIAGNOSIS — K50119 Crohn's disease of large intestine with unspecified complications: Secondary | ICD-10-CM

## 2017-12-07 DIAGNOSIS — Z951 Presence of aortocoronary bypass graft: Secondary | ICD-10-CM

## 2017-12-07 DIAGNOSIS — K7460 Unspecified cirrhosis of liver: Secondary | ICD-10-CM

## 2017-12-07 DIAGNOSIS — I447 Left bundle-branch block, unspecified: Secondary | ICD-10-CM

## 2017-12-07 DIAGNOSIS — I10 Essential (primary) hypertension: Secondary | ICD-10-CM

## 2017-12-07 DIAGNOSIS — G3184 Mild cognitive impairment, so stated: Secondary | ICD-10-CM

## 2017-12-07 DIAGNOSIS — Z23 Encounter for immunization: Secondary | ICD-10-CM

## 2017-12-07 DIAGNOSIS — J984 Other disorders of lung: Secondary | ICD-10-CM | POA: Diagnosis not present

## 2017-12-07 DIAGNOSIS — K219 Gastro-esophageal reflux disease without esophagitis: Secondary | ICD-10-CM | POA: Diagnosis not present

## 2017-12-07 DIAGNOSIS — Z95810 Presence of automatic (implantable) cardiac defibrillator: Secondary | ICD-10-CM | POA: Diagnosis not present

## 2017-12-07 DIAGNOSIS — I255 Ischemic cardiomyopathy: Secondary | ICD-10-CM | POA: Diagnosis not present

## 2017-12-07 MED ORDER — MEMANTINE HCL 10 MG PO TABS: 10.0000 mg | ORAL_TABLET | Freq: Two times a day (BID) | ORAL | 6 refills | Status: DC

## 2017-12-07 NOTE — Progress Notes (Signed)
Subjective:    Patient ID: [name], male    DOB: Jun 14, 1931, 84 y.o.   MRN: [id number]  HPI 84 y/o WM here for a follow up visit... he has multiple medical problems as noted below...   ~  followed by GI- DrChowdhury/ then [facility] [place]> now followed by DrPyrtle at [facility] w/ sm bowel Crohn's- s/p mult resections w/ complications, prev on 6-MP for prevention of recurrence... also has sl incr fasting bili c/w Gilbert's and developed cirrhosis per DrPyrtle believed to be on the basis of NAFLD...  ~  followed by [name] for Cards- CAD, s/p CABG, Cardiomyopathy w/ AICD- stable... f/u 2DEcho 11/09 showed diffuseHK w/ EF= 35% & DD, mod incr AoV thickness w/o AS... EKG= pacer rhythm.  ~  November 30, 2011:  126moROV & JWille Glaseris c/o a URI/ cold/ sinus infection w/ congestion/ drainage/ beige sput/ etc; we discussed ZPak & Saline for treatment... We reviewed the following medical problems during today's office visit>>     OSA> eval by DrClance 8/13 & started on CPAP but having some interface problems & they are checking download for compliance etc...    HBP> on Coreg6.25Bid, Lisin10, Lasix40-1.5/d, Aldactone25; BP= 116/72 & he denies any CP, palpit, ch in SOB/ edema/ etc...    CAD, Cardiomyopathy> on ASA81 & Coumadin; s/p CABG 1994, [facility] & Lasix increased to 665md; last seen by DrKlein 7/13 & ICD functioning normally...    Chol> on Simva20; not fasting today & his last FLP 10/12 showed TChol 124, TG 109, HDL 49, LDL 54    GI- GERD, Polyps> stable & he currently denies abd pain, n/v, c/d, blood seen...    Crohn's dis, Hx SBO> he is [place]Holmes, [place] and [name] & they have stopped his 6MP; Crohn's has been stable since then 7 he indicated he may start a biologic med in the future...    Hx mesenteric vasc insuffic w/ embolectomy> on Coumadin & monitored in the CC, stable...    BPH> aware- prev checked by  drNesi, PSAs have been wnl...    DJD, PMR> on B12, VitD, Folate; stable & not requiring pain meds or Pred...    Tremor> eval by DrDalphine Handingn the past w/ trial of Clonazepam w/ some improvement...    Anxiety> not currently on Klonopin etc...    Low [place]let ct> [place], [place]; Plats ranged 67-73K & they stopped his 6MP Rx for crohn's dis... We reviewed prob list, meds, xrays and labs> see below for updates >>   ~  April 18, 2012:  [place] several complaints/ concerns as listed below>>      C/o an URI w/ head congestion, drainage, etc but denies f/c/s, cough/ sput/ SOB & we discussed OTC meds to use prn...    BP controlled on meds> Coreg6.25Bid, Lisin10, Lasix40-1.5/d, Aldactone25; BP= 110/70 & he denies CP, palpit, SOB, etc...    He is c/o bilat breast swelling, sore & tender; he is on Aldactone25 & this is the likely culprit; we will switch to Eplerenone25...    He remains on Simva20 for his Chol & last FLP was 10/12 w/ TChol 124, TG 109, HDL 49, LDL 54; he needs to ret fasting for f/u labs...    [place] DrHolmes GI [place]/u Crohn's 12/13> stable on  Cholestyramine 4gm/d...    Wife says he's grumpy; offered med rx- SSRI etc but he declines...     He reports a Melanoma removed from left calf area recently w/ some post op swelling; path showed superfic spreading melanoma & it is being managed by DrLupton w/ Bx then wide excision We reviewed prob list, meds, xrays and labs> see below for updates >> he remains on VitD, B12, & Folate...  ~  July 17, 2012:  2moROV & add-on appt at wife's request due to feet swelling L>R w/ some discoloration & pain on left foot- hurts to walk on it x 1wk;  When pressed for explanation [name] mentioned that he did in fact drop a monkey wrench on his left foot ~1wk ago;  Exam shows ven insuff w/ some mild edema on right but left foot has several ecchymoses, 3+ edema, some redness (no broken skin etc) & tender  over 5th metatarsal area (top, side, & bottom);  XRay shows marked sublux vs dislocation at 5th MTP joint, no acute fx, +for osseous demineralization, etc...   REC> refer to Ortho ASAP...    Other medical issues appear stable>  BP=132/78 on his Coreg6.25Bid, Lisinopril10, Lasix40-2Qam, & Eplerenone25 (all breast tenderness has resolved);  He saw DBenay Spice4/14- CAD, cardiomyopathy, some incr edema & Lasix incr to 80Qam;  Stable on Coumadin & his AICD is functioning normally;  Chol looks good on his diet + Simva20;  He [place] & [facility] to [facility] (likely due to his prev 6MP rx...   ~  November 05, 2012:  3-450moOV & [name] saw Ortho after his last appt- DrHilts felts he had a chr dislocated 5th MTP, used compression wrap & his swelling improved over time;  In the interim he has developed prostate trouble- [facility] [facility] but we don't have any records; apparently he had Hematuria work up w/ urine, XRays, cysto- told to have BPH and had ?TURP? (we have requested records to review), he notes that urine stream is better now... We reviewed the following medical problems during today's office visit >>     OSA> eval by DrClance 8/13 & started on CPAP but having some interface problems & he is not currently using the apparatus...    HBP> on Coreg6.25Bid, Lisin10, Lasix40-1.5/d, Inspra25; BP= 140/86 & he denies any CP, palpit, ch in SOB/ edema/ etc...    CAD, Cardiomyopathy> on ASA81 & Coumadin; s/p CABG 1994, AICD placed & last seen by DrKlein 7/14- ICD functioning normally; CAD, s/p CABG w/ one occluded graft, EF=35%, Myoview 10/11 w/ scar, no ischemia; Lasix adjusted to improve periph edema...     Chol> on Simva20; not fasting today & his last FLP 10/12 showed TChol 124, TG 109, HDL 49, LDL 54... Needs to ret for f/u FLP.    GI- GERD, Polyps> stable & he currently denies abd pain, n/v, c/d, blood seen...     Crohn's dis, Hx SBO> he is followed [place] DrHolmes, [place] and [name] & they have stopped his 6MP; Crohn's has been stable since then & he indicated he may start a biologic med in the future...    Hx mesenteric vasc insuffic w/ embolectomy> on Coumadin & monitored in the CC, stable...    BPH> aware- [facility] have  been w[facility]; he reports [place] & we have requested records...    DJD, PMR> on B12, VitD, Folate; stable & not requiring pain meds or Pred... He has seen DrHilts for left foot pain, VI, chr dislocated 5th MTP, plantar fasciitis...    Tremor> eval by Dalphine Handing in the past w/ trial of Clonazepam w/ some improvement...    Anxiety> not currently on Klonopin etc...    Low [place] ct> He saw [place] Hematologist [place] on referral by DrHolmes, GI [place]; Plats ranged 67-73K & they stopped his 6MP Rx for crohn's dis & he needs f/u labs... We reviewed prob list, meds, xrays and labs> see below for updates >>  HE NEEDS TO RET FOR f/u FASTING blood work...  ~  March 11, 2013:  41moROV & JWille Glaserhas been staqble> CC is nasal congestion, drainage, stopped up; we doiscussed avail OTC meds to help w/ these symptoms- antihist, saline, fluticasone, and we will write for Astelin as well...  BP controlled on Coreg6.25Bid, Lisin10, Lasix40-1.5/d, Inspra25; BP= 136/82 & he feels ok w/o HA, CP, palpit, SOB, edema, etc...  He saw DrKatz 10/14> f/u cardiomyopathy, on Coumadin w/ hx mesenteric embolus, doing satis, no changes made Chol looks good on Simva20 w/ FLP 2/15 showing TChol 170, TG 175, HDL 61, LDL 74; discussed low fat diet... He reports being stable from the GI standpoint & stopped his Questran; hasn't been back to his GI in awhile...  [place]> hx hematuria & flank pain, BPH w/ obstruction; pt had a TURP in [place] (we do not have op note etc)...  Known  thrombocytopenia, [place] Heme [place] "it's just my pattern" he says...    We reviewed prob list, meds, xrays and labs> see below for updates >> Pt was given Pneumovax & TDAP today...  LABS 2/15:  FLP- Chol ok on Simva20 but TG=175 discussed diet;  Chems- ok x incr fasting bili c/w Gilbert's;  CBC- wnl x low platelets stable at 72K;  TSH=1.59;  Sed=2...  ~  September 09, 2013:  651moOV & [name] has established Primary Care [place]>>    [facility]- PT/INR was 6 on adm, EGD showed abn mucosa in the prepyloric region of the stomach & bx was neg for HPylori, Colon showed mild erythema & edema, few sessile polyps, w/o inflamm changes or bleeding sites seen; blood could have come from the sm bowel based on his hx- Hg nadir was 8.5 & he was transfused 2uPC's;  Hg has improved to 11 range but MCV is still low at 71 & Iron has not been checked- I told him to start OTC Fe daily; he is back on Coumadin per [facility] w/ careful monitoring... He has had f/u w/ AE & sched to see DrPyrtle soon...    He had f/u DrKatz for Cards 8/15> CAD, s/p CABG, ICM and chr sys CHF w/ ICD per DrKlein, meds adjusted- on Coreg6.25Bid, Lisin10, Lasix40Bid, K20, Coumadin via CC...    From the Pulm standpoint he had AR w/ Flonase & Astelin but breathing has been satis- denies cough, sput, hemoptysis, ch in SOB/DOE etc;  He had bronchitic infection several months ago & he tells me he took wife's antibiotics and got better!  He also has hx OSA- prev on CPAP but he prob w/ the interface & hasn't used it in  yrs- resting ok & denies daytime hypersomnolence;   last CXR 3/15 showed cardiomeg, pacer, s/p CABG, azygos lobe, ?underlying COPD- we continue to follow for any resp issues...    We reviewed prob list, meds, xrays and labs> see below for updates >>    ~  February 27, 2017:  3.5year ROV & pulmonary follow up appt requested for upper resp infection, burning in the  chest, weakness, and poor memory according to wife;  He continues to be followed in the Coumadin Clinic, has remote ICD/pacer transmissions Q50m& ICM monitoring- last done 02/20/17 on Lasix40Bid, K20/d, and felt to be stable- no changes made... THEY WERE NOT AWARE THAT Pt HAD DECR LASIX ON HIS OWN TO 475md...    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    He saw DrKlein 11/28/16>  CAD-s/p CABG and chr sys CHF w/ EF=35%, he has ICD implanted for primary prevention; last Echo 11/2014 w/ EF=35-40%; EKG shows AV pacing...    He saw PCP office DrWendling 01/19/17> c/o 3d hx sore throat, Temp101, cough; Chest was clear- he was treated for bronchitis w ZPak, fluids, Mucinex;  We reviewed the following medical problems during today's office visit >>     OSA> eval by DrClance 8/13 & started on CPAP but having some interface problems & he is still not currently using the apparatus!    HBP> on Coreg6.25Bid, Lisin10, Lasix40/d, K20; BP= 126/78 & he denies any CP, palpit, ch in SOB/ edema/ etc...    CAD, Cardiomyopathy> on Coumadin via CC; s/p CABG 1994, AICD placed & lfollowed by DrKlein- ICD functioning normally; CAD, s/p CABG w/ one occluded graft, EF=35%, Myoview 10/11 w/ scar, no ischemia; Lasix adjusted to improve periph edema...     Chol> on Simva20; his last FLP 7/18 showed TChol 131, TG 106, HDL 52, LDL 58...    GI- hx GIB, GERD, Polyps> stable on Protonix40 & he currently denies abd pain, n/v, c/d, blood seen...    Crohn's dis, Hx SBO> [place] and [name] & they have stopped his  prev 6MP rx; now followed by DrPyrtle- Crohn's has been stable...    Cirrhosis followed by DrPyrtle>  Liver fibrosis blood test indicates advanced fibrosis, stage F4, they feel likely due to fatty liver dis;  Abd Sonar 08/2016 showed incr echogenicity c/w fatty infiltration & irreg contour c/w cirrhosis;  REC no etoh, diet rx, monitor for HCC.    Hx mesenteric vasc insuffic w/ embolectomy> on Coumadin & monitored in the CC, stable...    BPH> aware- [facility]; he [facility] we have requested records...    DJD, PMR> on B12, VitD, Folate; stable & not requiring pain meds or Pred... He has seen DrHilts for left foot pain, VI, chr dislocated 5th MTP, plantar fasciitis...    Tremor> eval by DrDalphine Handingn the past w/ trial of Clonazepam w/ some improvement...    Anxiety> not currently on Klonopin etc...[name]  Low [place] ct> He saw a Hematologist [place] by [place]; Plats ranged 67-73K & they stopped his 6MP Rx for crohn's dis; Plats remain 50-90K => prob from splenomegaly assoc w/ his cirrhosis due to FLD... EXAM shows Afeb, VSS, O2sat=93% on RA;  HEENT- neg, mallampati2;  Chest- decr BS but clear w/o w/r/r;  Heart- RR gr1/6 SEM w/o r/g;  Abd- soft, nontender, neg;  Ext- VI, +edema, w/o c/c;  Neuro- sl slow, intact, nonfocal...  CXR 02/27/17 (independently reviewed by me in the PACS system) showed borderline heart size, aortic atherosclerosis, AICD on left, and prior CABG; lungs are clear x mild atx at bases, NAD...   LABS 02/27/17>  Chems- wnl x BS=106, Bili=2.6, other LFTs were wnl.  CBC- wnl w/ Hg=15.3 but PLAT=60K;  TSH=2.68;  BNP=118 IMP/PLAN>>  We discussed treating him w/ Depo80 & Medrol 19m tabs- 5d tapering interval (Bid, Qd, 1/2 daily, then Qod til return);  He is asked to incr the LASIX40 to Bid for 5d then back to one Qam;  We will recheck in 4-6wks;  JKennyth Losewanted him to start something for his memory- given  Aricept128m1/2 tab Qd...  ~  March 28, 2017:  19m15moV & [name] Glaserturns for f/u after his 01/2017 visit for URI & post viral syndrome- given Depo80, Medrol taper, and recommended to start on Aricept 5mg419mfor his memory (wife requested that he start on medication);  We also bumped his Lasix to 40Bid for 5d then back to 40mg12m only...  They report that [name] is improved- nasal congestion & hacking cough are better, edema is decr after the transient bump in Lasix + low sodium diet, and his memory "slightly better" having started on the Aricept 10mg-96m tab Qd...  CXR & LABS above- NAD, Plat=60K chronically (likely due to cirrhosis from FLD w/ splenomegaly), Protimes per Coumadin clinic...    PROBLEM LIST AS OUTLINES ABOVE... EXAM shows Afeb, VSS, O2sat=94% on RA;  HEENT- neg, mallampati2;  Chest- decr BS but clear w/o w/r/r;  Heart- RR gr1/6 SEM w/o r/g;  Abd- soft, nontender, neg;  Ext- VI, +tr edema, w/o c/c;  Neuro- slow mentation, intact, nonfocal... IMP/PLAN>>  [name] is improved from his URI/ refractory symptoms after the Medrol course;  Wife feels his memory is sl better on the Aricept5 & wants to incr to 10mg/d66me continues under the care of DrBlyth/ Wendling for primary care and DrPyrtle for GI w/ Sonar Q6mo to 664moh for HCC [facility]...   ~  December 07, 2017:  64mo ROV 32modical follow up>               Problem List:  OSA >> He had a sleep study w/ AHI=34- loud snoring & abnormal breathing pattern noted by wife; pt notes freq awakenings & not feeling rested in the AM; but he denied issues w/ daytime sleepiness & his ESS= only1... Started on CPAP & they are following up... ~  He reports mask/ interface problems and he is not using the CPAP; asked to check w/ DME & f/u w/ DrClance...  AR/ AB >> From the Pulm standpoint he had AR w/ Flonase & Astelin but breathing has been satis- denies cough, sput, hemoptysis, ch in SOB/DOE etc;  He had bronchitic infection several months ago & he  tells me he took wife's antibiotics and got better!  He also has hx OSA- prev on CPAP but he prob w/ the interface &  hasn't used it in yrs- resting ok & denies daytime hypersomnolence;   last CXR 3/15 showed cardiomeg, pacer, s/p CABG, azygos lobe, ?underlying COPD- we continue to follow for any resp issues...   CARDS followed by [name] & DrKlein >>  HBP, CAD, Cardiomyopathy >> followed by [name] and DrKlein... he had CABG in 1994 (with cath in 96 revealing a total right and an 80% OM, vein graft to the diagonal was occluded, other bypasses were patent), Biv AICD in 2006...  On COUMADIN (follow in clinic), ASA 48m/d,  COREG 6.267mid,  LISINOPRIL 204m,  LASIX 48m51m  SPIRONOLACTONE 25mg33m. ~  Cardiolite 3/03 w/ EF=37%...  ~  Cath 6/06 w/ native vessel and graft disease...  ~  2DEcho 11/09 stable w/ mod LVD- diffuse HK w/ EF= 35%, DD, mod incr AoV thickness w/o AS...  ~  3/11:  CXR showed stable CABG, AICD, & chr changes... ~  10/11:  Myoview showed extensive scar, no ischemia, EF= 34% ~  10/11:  New AICD placed by DrKlein... ~  11/11:  2DEcho showed mod reduced LV sys function w/ EF= 30-35%, diffuse HK, gr1 DD> see report... ~  10/12:  BP= 126/68 & stable from the CV perspective... ~  4/13:  He's had f/u w/ Cards> DrKatz et al; notes reviewed, no med changes other than Coumadin regulation... ~  7/13:  His AICD was interrogated by DrKlein & no problems identified... ~  8/13:  2DEcho showed mild LVH, mod decr LVF w/ EF=35-40% w/ HK of the entire myocardium, Gr1DD, mild AoV sclerosis, mild MR, normal RV... ~  8/13:  VenDopplers of LEs ordered by Cards- neg for DVT ~  10/13:  He had f/u DrKatz who noted edema is diminished on the Lasix60... ~  10/13: on Coreg6.25Bid, Lisin10, Lasix40-1.5/d, Aldactone25; BP= 116/72 & he denies any CP, palpit, ch in SOB/ edema/ etc... ~  3/14:  BP controlled on meds> Coreg6.25Bid, Lisin10, Lasix40-1.5/d, Aldactone25; BP= 110/70 & he denies CP, palpit, SOB, etc;  but he has developed gynecomastia from the aldactone & we will switch to Eplerenone25... ~  6/14:  BP=132/78 on his Coreg6.25Bid, Lisinopril10, Lasix40-2Qam, & Eplerenone25 (all breast tenderness has resolved);  He saw DrKatBenay Spice- CAD, cardiomyopathy, some incr edema & Lasix incr to 80Qam... ~  10/14: on Coreg6.25Bid, Lisin10, Lasix40-1.5/d, Inspra25; BP= 140/86 & he denies any CP, palpit, ch in SOB/ edema/ etc; Also on ASA81 & Coumadin; s/p CABG 1994, AICD placed & last seen by DrKlein 7/14- ICD functioning normally; CAD, s/p CABG w/ one occluded graft, EF=35%, Myoview 10/11 w/ scar, no ischemia; Lasix adjusted to improve periph edema ~  2/15: BP controlled on Coreg6.25Bid, Lisin10, Lasix40-1.5/d, Inspra25; BP= 136/82 & he feels ok w/o HA, CP, palpit, SOB, edema, etc; He saw DrKatz 10/14> f/u cardiomyopathy, on Coumadin w/ hx mesenteric embolus, doing satis, no changes made.  Primary Care per DrBlythe >>  HYPERLIPIDEMIA - he takes SIMVASTATIN 20mg/65m he's had min, non-progressive incr LFTs noted. ~  FLP 4/Harpersvilleshowed TChol 138, TG 138, HDL 38, LDL 72... tolerating med well. ~  FLP 4/Garbervilleshowed TChol 118, TG 111, HDL 33, LDL 63 ~  FLP 12/09 showed TChol 118, TG 100, HDL 31, LDL 67 ~  FLP 10/10 on Simva20 showed TChol 111, TG 139, HDL 36, LDL 47 ~  FLP 10/11 on Simva20 showed TChol 101, TG 102, HDL 35, LDL 45 ~  FLP 10/12 on Simva20 showed TChol 124, TG 109, HDL 49, LDL 54 ~  FLP 10/13 &  3/14> pt wasn't fasting for blood work today... ~  [place] 2/15 on Simva20 showed TChol 170, TG 175, HDL 61, LDL 74  GI per DrPyrtle >>  GERD (ICD-530.81) - no active symptoms and not currently taking PPI etc...  COLONIC POLYPS (ICD-211.3) - last colonoscopy here was 7/04 by Demetra Shiner w/ divertics and several polyps from 6 - 69m size, reportedly adenomatous on bx but I cannot find the path reports... he is overdue for f/u colon... ~  labs 2/10 showed Hg= 15.7..Marland KitchenMarland Kitchenwe will send copy of note to DrChowdhury w/ request for  f/u colonoscopy. ~  f/u colonoscopy 11/10 by DrChowdhury showed norm TI & several polyps removed- path +tubular adenoma...  CROHN'S DISEASE (ICD-555.9) - he [place]Chowdhury [place] 6-MP 510mtabs- 1.5 tabs/d... he notes intermittent diarrhea and was started on Questran Prn... ~  labs 3/11 noted sl incr LFTs, Protime, & Sed... ~  labs 10/11 showed norm chems & LFTs x incr bili, Plat=88K, BNP=219, otherw norm. ~  Labs 10/12 showed norm chems, SGOT=59, TBili=3.9, Plat=85K... ~  [facility]/13> off 6MP due to thrombocytopenia & doing satis, considering a biologic if needed, considering repeat colonoscopy... ~  [facility]> doing satis on Cholestyramine4gm daily, no changes made... ~  2/15: He reports being stable from the GI standpoint & stopped his Questran; hasn't been back to his GI in awhile...   Hx of ACUTE VASCULAR INSUFFICIENCY OF INTESTINE (ICD-557.0) - he has embolectomy of the superior mesenteric artery 1/03 by DrSheryn Bisonlong hospitalization w/ complications)... he has remained on COUMADIN in the coumadin clinic ever since then...  VENTRAL HERNIA (ICD-553.20) - he is s/p left inguinal hernia repair 6/08 by DrClarita Leberand had a prev incarcerated ventral hernia repaired 11/03... his residual/ recurrent ventral hernia has been evaluated by DrClarita Leberho feels that [place]... currently he wears an abd binder when up and about...  SBO >> Hosp 3/14 - 04/19/11 by [facility] w/ SBO that opened up w/o surgical intervention... ~  CT Abd 3/13 showed early SBO pattern from an anterior wall hernia defect; ?GE varicies noted, s/p GB, hepatic steatosis, calcif granulomas in liver, cyst in right kidney, atherosclerotic ulcer in infrarenal Ao, ectasia of iliacs...  [place]  HYPERTROPHY PROSTATE W/UR OBST & OTH LUTS (ICD-600.01) - saw DrNesi 7/10 w/ BPH, obstructive symptoms, and ED- given  Flomax & "shots"...  ~   Labs 10/12 showed PSA= 2.93 ~  10/14: he tells me he [facility] [place] not have [facility] & have requested these... ~  2/15: [place]> hx hematuria & flank pain, BPH w/ obstruction; pt had a TURP in [place] (we do not have op note etc)...   DEGENERATIVE JOINT DISEASE (ICD-715.90) >>  ~  6/14:  He presented w/ swelling in the feet, but L>R w/ pain & tender laterally; then he admitted to dropping a monkey wrench on his foot 1 week ago; XRay showed subluxed/ dislocated 5th MTP=> to Ortho ASAP...  Hx of POLYMYALGIA RHEUMATICA (ICD-725) - hasn't req steroids x yrs... ~  labs 2/10 showed Sed= 8 ~  labs 10/10 showed Sed= 6 ~  labs 3/11 showed Sed= 51 w/ acute illness. ~  labs 10/11 showed Sed= 9 ~  Labs 10/12 showed Sed= 9  TREMOR (ICD-781.0) - eval 3/10 [facility]alph[facility]- essential tremor w/ +FamHx in sister...Marland KitchenMarland Kitchen  trial Clonazepam 0.59m Bid w/ improvement...  ANXIETY (ICD-300.00) - he uses the same Clonazepam 0.269mPrn...  Hx of SHINGLES (ICD-053.9)  Thrombocytopenia >> serial labs w/ Plat counts betw 223 ==> 85; no bleeding diathesis, & he had acute intestinal vasc insuffic 2003 requiring embolectomy & has been on Coumadin Rx ever since then... ~  [place]Hol[place], [place]; Plats ranged 67-73K & they stopped his 6MP Rx for crohn's dis & he needs f/u labs.  ANEMIA >> [facility] w/ UGI bleed & Hg= 8.5 assoc w/ PT/ INR ~6 ~  Follow up labs & Rx per GI & Primary Care...   VITAMIN B12 DEFICIENCY (ICD-266.2) - on B 12 10007md OTC... ~  labs 2/10 showed B12 level = 215... rec start oral B12 supplement daily. ~  labs 10/10 showed B12 level = 416... continue oral B12 supplement. ~  labs 10/11 showed B12= 592... continue same. ~  Labs 10/12 showed B12= 573  VITAMIN D DEFICIENCY (ICD-268.9) - on Vit D 1000 u daily OTC... ~  labs 2/10 showed Vit D level = 23... rec> start Vit D 1000  u daily.. ~  Labs 10/12 showed Vit D level = 50   Past Surgical History:  Procedure Laterality Date  . BIV ICD GENERATOR CHANGEOUT N/A 10/23/2017   Procedure: BIV ICD GENERATOR CHANGEOUT;  Surgeon: KleDeboraha SprangD;  Location: [facility];  Service: Cardiovascular;  Laterality: N/A;  . CATARACT EXTRACTION, BILATERAL  2014  . COLONOSCOPY N/A 04/19/2013   Procedure: COLONOSCOPY;  Surgeon: JayJerene BearsD;  [facility];  Service: Endoscopy;  Laterality: N/A;  . CORONARY ARTERY BYPASS GRAFT  1994  . Elap w/ superior mesenteric artery embolectomy  1/03   by Dr. LawJennette Banker ESOPHAGOGASTRODUODENOSCOPY N/A 04/18/2013   Procedure: ESOPHAGOGASTRODUODENOSCOPY (EGD);  Surgeon: JayJerene BearsD;  [facility];  Service: Endoscopy;  Laterality: N/A;  . implantation of biventric cardioverter-defibrillatorr     by Dr. TayLovena Le 8/06 Guidant Contak H170-explanted 2011  . implantation of guidant cognis device     model n119-2011  . laparoscopic cholecystectomy  2002  . left inguinal hernia repair with mesh  6/08   by Dr. HinBetsy Pries PROSTATE SURGERY  08/2012   [place] of incarcerated ventral hernia and lysis of adhesions  11/03   by Dr. HinBetsy Pries Outpatient Encounter Medications as of 12/07/2017  Medication Sig  . carvedilol (COREG) 6.25 MG tablet TAKE 1 TABLET BY MOUTH TWICE DAILY WITH MEALS (Patient taking differently: Take 6.25 mg by mouth 2 (two) times daily with a meal. )  . cholecalciferol (VITAMIN D) 1000 UNITS tablet Take 1,000 Units by mouth daily.   . dMarland Kitchennepezil (ARICEPT) 10 MG tablet Take 1 tablet (10 mg total) by mouth at bedtime.  . furosemide (LASIX) 40 MG tablet Take 1 tablet (40 mg total) by mouth 2 (two) times daily. Please keep upcoming appt in September before anymore refills. Thank you (Patient taking differently: Take 40 mg by mouth daily. Takes 1 tab daily and 2 if needed)  . hydrocortisone cream 1 % Apply 1 application  topically daily as needed for itching.  . lMarland Kitchensinopril (PRINIVIL,ZESTRIL) 10 MG tablet TAKE 1 TABLET BY MOUTH ONCE DAILY  . oxymetazoline (AFRIN) 0.05 % nasal spray Place 1 spray into both nostrils daily as needed for congestion.  . pantoprazole (PROTONIX) 40 MG tablet TAKE 1  TABLET BY MOUTH ONCE DAILY AT  12  NOON (Patient taking differently: Take 40 mg by mouth daily. TAKE 1 TABLET BY MOUTH ONCE DAILY AT  12  NOON)  . potassium chloride SA (KLOR-CON M20) 20 MEQ tablet Take 1 tablet (20 mEq total) by mouth daily.  . simvastatin (ZOCOR) 20 MG tablet TAKE 1 TABLET BY MOUTH AT BEDTIME (Patient taking differently: Take 20 mg by mouth at bedtime. )  . vitamin B-12 (CYANOCOBALAMIN) 1000 MCG tablet Take 1,000 mcg by mouth daily.   [name] warfarin (COUMADIN) 5 MG tablet TAKE AS DIRECTED BY  COUMADIN  CLINIC (Patient taking differently: Take 2.5-5 mg by mouth See admin instructions. Take 17m by mouth daily except Saturday take 2.539m  . [DISCONTINUED] tiZANidine (ZANAFLEX) 2 MG tablet Take 1 tablet (2 mg total) by mouth at bedtime as needed for muscle spasms.  . memantine (NAMENDA) 10 MG tablet Take 1 tablet (10 mg total) by mouth 2 (two) times daily.   No facility-administered encounter medications on file as of 12/07/2017.     No Known Allergies    Immunization History  Administered Date(s) Administered  . Influenza Split 11/24/2010, 11/30/2011, 10/31/2012  . Influenza Whole 11/06/2008  . Influenza, High Dose Seasonal PF 11/10/2015, 12/01/2016, 12/07/2017  . Influenza,inj,Quad PF,6+ Mos 12/30/2013, 12/09/2014  . Pneumococcal Conjugate-13 12/09/2014  . Pneumococcal Polysaccharide-23 03/11/2013  . Tdap 03/11/2013    Current Medications, Allergies, Past Medical History, Past Surgical History, Family History, and Social History were reviewed in [employer].    Review of Systems         See HPI - all other systems neg except as noted... The patient complains of decreased  hearing and dyspnea on exertion.  The patient denies anorexia, fever, weight loss, weight gain, vision loss, hoarseness, chest pain, syncope, peripheral edema, prolonged cough, headaches, hemoptysis, abdominal pain, melena, hematochezia, severe indigestion/heartburn, hematuria, incontinence, muscle weakness, suspicious skin lesions, transient blindness, difficulty walking, depression, unusual weight change, abnormal bleeding, enlarged lymph nodes, and angioedema.     Objective:   Physical Exam     WD, WN, 847/o WM in NAD... GENERAL:  Alert & oriented; pleasant & cooperative... HEENT:  [place]/AT, EOM-wnl, PERRLA, EACs-clear, TMs-wnl, NOSE- sl red/ inflamm, THROAT-clear & wnl. NECK:  Supple w/ fairROM; no JVD; normal carotid impulses w/o bruits; no thyromegaly or nodules palpated; no lymphadenopathy. CHEST:  Decr BS w/ few scat rhonchi, no wheezing, no rales, no signs of consolidation. HEART:  Regular Rhythm; gr 1/6 SEM without rubs or gallops detected... ABDOMEN:  Soft & nontender; mult scars, & ventral hernia; normal bowel sounds; no organomegaly or masses palpated. EXT: without deformities, mod arthritic changes; no varicose veins/ venous insuffic/ or edema. NEURO:  CN's intact; I do not apprec a tremor at present; no focal neuro deficits... DERM:  No lesions noted; no rash etc...  RADIOLOGY DATA:  Reviewed in the EPIC EMR & discussed w/ the patient...  LABORATORY DATA:  Reviewed in the EPIC EMR & discussed w/ the patient...   Assessment & Plan:    AR/ AB/ OSA >>  02/27/17 -- We discussed treating him w/ Depo80 & Medrol 61m33mabs- 5d tapering interval (Bid, Qd, 1/2 daily, then Qod til return);  He is asked to incr the LASIX40 to Bid for 5d then back to one Qam;  We will recheck in 4-6wks;  JacKennyth Losented him to start something for his memory- given Aricept10m56m2 tab Qd... 03/28/17>   [name] is improved from  his URI/ refractory symptoms after the Medrol course;  Wife feels his memory is sl better  on the Aricept5 & wants to incr to 36m/d;  He continues under the care of DrBlyth/ [place] for primary care and DrPyrtle for GI w/ [facility] [id number] watch [facility].   HBP>  BP controlled, continue same meds per Cards and Primary Care...  CAD/ Cardiomyopathy>  Followed by DrBenay Spice DrKlein, their notes are reviewed...  Hyperlipid>  On Simva20 + diet> continue same...  GI>  Hx Crohn's and Cirrhosis from prob FLD>  Followed by DrPyrtle for Le[facility] GI now...  Hx vasc insuffic intestines>  SMA embolectomy 2003, he remains on Coumadin via the CC...  Hx SBO requiring Hosp 3/13 but resolved w/ conserv approach per DrHIngram, CCS...  Hx UGIB 3/15> assoc w/ PT/INR~6, Hg nadir ~8, tx 2u, ?source- EGD/Colon per DrPyrtle, bleeding stopped spont  Left FOOT PAIN => dislocated left 5th MTP joint; seen by DrHilts who felt this was chronic, treated edema w/ compression hose & swelling is down...  Other medical problems as noted...   Patient's Medications  New Prescriptions   MEMANTINE (NAMENDA) 10 MG TABLET    Take 1 tablet (10 mg total) by mouth 2 (two) times daily.  Previous Medications   CARVEDILOL (COREG) 6.25 MG TABLET    TAKE 1 TABLET BY MOUTH TWICE DAILY WITH MEALS   CHOLECALCIFEROL (VITAMIN D) 1000 UNITS TABLET    Take 1,000 Units by mouth daily.    DONEPEZIL (ARICEPT) 10 MG TABLET    Take 1 tablet (10 mg total) by mouth at bedtime.   FUROSEMIDE (LASIX) 40 MG TABLET    Take 1 tablet (40 mg total) by mouth 2 (two) times daily. Please keep upcoming appt in September before anymore refills. Thank you   HYDROCORTISONE CREAM 1 %    Apply 1 application topically daily as needed for itching.   LISINOPRIL (PRINIVIL,ZESTRIL) 10 MG TABLET    TAKE 1 TABLET BY MOUTH ONCE DAILY   OXYMETAZOLINE (AFRIN) 0.05 % NASAL SPRAY    Place 1 spray into both nostrils daily as needed for congestion.   PANTOPRAZOLE (PROTONIX) 40 MG TABLET    TAKE 1 TABLET BY MOUTH ONCE DAILY AT  12  NOON   POTASSIUM  CHLORIDE SA (KLOR-CON M20) 20 MEQ TABLET    Take 1 tablet (20 mEq total) by mouth daily.   SIMVASTATIN (ZOCOR) 20 MG TABLET    TAKE 1 TABLET BY MOUTH AT BEDTIME   VITAMIN B-12 (CYANOCOBALAMIN) 1000 MCG TABLET    Take 1,000 mcg by mouth daily.    WARFARIN (COUMADIN) 5 MG TABLET    TAKE AS DIRECTED BY  COUMADIN  CLINIC  Modified Medications   No medications on file  Discontinued Medications   TIZANIDINE (ZANAFLEX) 2 MG TABLET    Take 1 tablet (2 mg total) by mouth at bedtime as needed for muscle spasms.

## 2017-12-07 NOTE — Patient Instructions (Addendum)
Today we updated your med list in our EPIC system...    Continue your current medications the same...  Today we checked a follow up CXR...    We will contact you w/ the results when available...   We also added NAMENDA to the Aricept for [name]'s dementia...    Start w/ one tab daily 7 gradually increase to one tab TWICE daily...  We discussed my up-coming retirement and printed out the physicians avail at the [facility] facility...  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    Sending you my very best wishes for good health 7 much happiness in the future!!!

## 2017-12-11 ENCOUNTER — Ambulatory Visit (INDEPENDENT_AMBULATORY_CARE_PROVIDER_SITE_OTHER): Payer: Medicare HMO

## 2017-12-11 DIAGNOSIS — I5022 Chronic systolic (congestive) heart failure: Secondary | ICD-10-CM

## 2017-12-11 DIAGNOSIS — Z95810 Presence of automatic (implantable) cardiac defibrillator: Secondary | ICD-10-CM

## 2017-12-11 NOTE — Progress Notes (Signed)
EPIC Encounter for ICM Monitoring  Patient Name: [name] is a 84 y.o. male Date: 12/11/2017 Primary Care Physican: [name], MD Primary Cardiologist:Cooper/Weaver, PA Electrophysiologist: Caryl Comes Last Weight: 188lbs Today's Weight: 186 lbs Right Ventricular: 94% Left Ventricular:   93%  Since Last Reset  Nov 06, 2017  AT/AF <1 %   AT/AF Events 5   Total Time in AT/AF (min) 5.9         Heart Failure questions reviewed, pt asymptomatic.   HeartLogic Heart Failure Index is 5.  Index has not crossed the threshold suggesting normal.  Prescribed: Furosemide 40 mg 1 tablet twice a day. Potassium 20 mEq 1 tablet daily  Recommendations: No changes.  Advised to limit salt intake to 2000 mg/day and fluid intake to < 2 liters/day.  Encouraged to call for fluid symptoms.  Follow-up plan: ICM clinic phone appointment on 01/11/2018.          Copy of ICM check sent to Dr. Caryl Comes.    3 Month Trend    8 Day Data Trend            Rosalene Billings, RN 12/11/2017 8:03 AM

## 2017-12-12 ENCOUNTER — Encounter: Payer: Self-pay | Admitting: Internal Medicine

## 2018-01-01 ENCOUNTER — Other Ambulatory Visit: Payer: Self-pay | Admitting: Cardiology

## 2018-01-03 ENCOUNTER — Telehealth: Payer: Self-pay | Admitting: Cardiology

## 2018-01-03 ENCOUNTER — Ambulatory Visit: Payer: Medicare HMO | Admitting: *Deleted

## 2018-01-03 DIAGNOSIS — Z5181 Encounter for therapeutic drug level monitoring: Secondary | ICD-10-CM | POA: Diagnosis not present

## 2018-01-03 DIAGNOSIS — Z0000 Encounter for general adult medical examination without abnormal findings: Secondary | ICD-10-CM

## 2018-01-03 DIAGNOSIS — K55069 Acute infarction of intestine, part and extent unspecified: Secondary | ICD-10-CM | POA: Diagnosis not present

## 2018-01-03 LAB — POCT INR: INR: 2.4 (ref 2.0–3.0)

## 2018-01-03 NOTE — Telephone Encounter (Signed)
I'm ok with this as long as he realizes my office availability is extremely limited and he will be seeing Scott the majority of the time

## 2018-01-03 NOTE — Patient Instructions (Signed)
Description   Continue same dose of coumadin 1 tablet daily except 1/2 tablet on Tuesdays and Saturdays.  Recheck INR in 6 weeks. Call our office if you have any concerns or are placed on any new medications [phone].  keep intake of greens consistent

## 2018-01-03 NOTE — Telephone Encounter (Signed)
New message   Patient wants to switch from Dr Meda Coffee to Dr Burt Knack due to availability  See Nicki Reaper note from September

## 2018-01-11 ENCOUNTER — Ambulatory Visit (INDEPENDENT_AMBULATORY_CARE_PROVIDER_SITE_OTHER): Payer: Medicare HMO

## 2018-01-11 DIAGNOSIS — Z95810 Presence of automatic (implantable) cardiac defibrillator: Secondary | ICD-10-CM | POA: Diagnosis not present

## 2018-01-11 DIAGNOSIS — I5022 Chronic systolic (congestive) heart failure: Secondary | ICD-10-CM | POA: Diagnosis not present

## 2018-01-15 NOTE — Progress Notes (Signed)
EPIC Encounter for ICM Monitoring  Patient Name: [name] is a 84 y.o. male Date: 01/15/2018 Primary Care Physican: [name], MD Primary Cardiologist:Cooper/Weaver, PA Electrophysiologist: Caryl Comes Last Weight: 186lbs Today's Weight: unknown Right Ventricular: 94% Left Ventricular:   93%  Since Last Reset Nov 06, 2017 Atrial Arrhythmia   AT/AF <1 %   AT/AF Events 12   Total Time in AT/AF (hr) 5.8                                                                            Transmission reviewed.   HeartLogic Heart Failure Index is 2.  Index has not crossed the threshold suggesting normal.  Prescribed: Furosemide 40 mg 1 tablet twice a day. Potassium 20 mEq 1 tablet daily  Recommendations: None  Follow-up plan: ICM clinic phone appointment on 03/12/2018.  Office appointment scheduled with Dr Caryl Comes 02/09/2018         Copy of ICM check sent to Dr. Caryl Comes.            Rosalene Billings, RN 01/15/2018 1:59 PM

## 2018-01-16 ENCOUNTER — Other Ambulatory Visit: Payer: Self-pay | Admitting: Cardiology

## 2018-02-09 ENCOUNTER — Ambulatory Visit: Payer: Medicare HMO | Admitting: *Deleted

## 2018-02-09 ENCOUNTER — Ambulatory Visit (INDEPENDENT_AMBULATORY_CARE_PROVIDER_SITE_OTHER): Payer: Medicare HMO | Admitting: Internal Medicine

## 2018-02-09 ENCOUNTER — Encounter: Payer: Self-pay | Admitting: Internal Medicine

## 2018-02-09 VITALS — BP 110/76 | HR 61 | Ht 73.0 in | Wt 189.0 lb

## 2018-02-09 DIAGNOSIS — I5022 Chronic systolic (congestive) heart failure: Secondary | ICD-10-CM

## 2018-02-09 DIAGNOSIS — I2510 Atherosclerotic heart disease of native coronary artery without angina pectoris: Secondary | ICD-10-CM | POA: Diagnosis not present

## 2018-02-09 DIAGNOSIS — I10 Essential (primary) hypertension: Secondary | ICD-10-CM

## 2018-02-09 DIAGNOSIS — I447 Left bundle-branch block, unspecified: Secondary | ICD-10-CM

## 2018-02-09 DIAGNOSIS — Z5181 Encounter for therapeutic drug level monitoring: Secondary | ICD-10-CM

## 2018-02-09 DIAGNOSIS — Z95810 Presence of automatic (implantable) cardiac defibrillator: Secondary | ICD-10-CM | POA: Diagnosis not present

## 2018-02-09 DIAGNOSIS — K55069 Acute infarction of intestine, part and extent unspecified: Secondary | ICD-10-CM | POA: Diagnosis not present

## 2018-02-09 DIAGNOSIS — I480 Paroxysmal atrial fibrillation: Secondary | ICD-10-CM

## 2018-02-09 DIAGNOSIS — I255 Ischemic cardiomyopathy: Secondary | ICD-10-CM | POA: Diagnosis not present

## 2018-02-09 DIAGNOSIS — Z0000 Encounter for general adult medical examination without abnormal findings: Secondary | ICD-10-CM

## 2018-02-09 LAB — CUP PACEART INCLINIC DEVICE CHECK
Implantable Lead Implant Date: 20060808
Implantable Lead Location: 753858
Implantable Lead Location: 753859
Implantable Lead Model: 158
Implantable Lead Model: 4087
Implantable Lead Serial Number: 261368
MDC IDC LEAD IMPLANT DT: 20060808
MDC IDC LEAD IMPLANT DT: 20060911
MDC IDC LEAD LOCATION: 753860
MDC IDC LEAD SERIAL: 165392
MDC IDC PG IMPLANT DT: 20190923
MDC IDC PG SERIAL: 134188
MDC IDC SESS DTM: 20200110163550

## 2018-02-09 LAB — POCT INR: INR: 2.7 (ref 2.0–3.0)

## 2018-02-09 NOTE — Progress Notes (Signed)
Patient Care Team: [name], MD as PCP - General (Family Medicine) Sherren Mocha, MD as PCP - Cardiology (Cardiology) Pyrtle, Lajuan Lines, MD as Consulting Physician (Gastroenterology) Darleen Crocker, MD as Consulting Physician (Ophthalmology) Dorothy Spark, MD as Consulting Physician (Cardiology) Veva Holes as Consulting Physician (Urology) Waynetta Sandy, MD as Consulting Physician (Vascular Surgery) Deboraha Sprang, MD as Consulting Physician (Cardiology)   HPI  [name] is a 85 y.o. male ICD implanted for primary prevention in the setting of ischemic coronary disease with prior CABG as part of the MADIT-CRT trial.   His last catheterization in 2006 demonstrated a total right and 80% marginal. Vein graft to the diagonal was occluded and other bypasses were open; ejection fraction 2007 was about 35%  Nuclear scan Oct 2011>>scar no ischemia  Echo 10/16  EF 35-40%   The patient denies chest pain, shortness of breath, nocturnal dyspnea, orthopnea or peripheral edema.  There have been no palpitations, lightheadedness or syncope.  But he notes that he takes things easy compared to before.  9/19 presented with device failure secondary to battery--CRT-D  Date Cr K  Hgb  8/18 1.36 3.6 14.1   9/19 1.05 4.2 13.8   The patient denies chest pain, shortness of breath, nocturnal dyspnea, orthopnea.  Some peripheral edema which his wife addresses by increasing his diuretic peripheral edema.  There have been no palpitations, lightheadedness or syncope.     Past Medical History:  Diagnosis Date  . AAA (abdominal aortic aneurysm) ([place])   . Abdominal pain 04/14/2011  . Acute bronchitis 09/02/2013  . Acute vascular insufficiency of intestine (HCC)    Mesenteric embolus...surgery 2003  . Allergic rhinitis 09/09/2013  . Anxiety   . Aortic atherosclerosis ([place]) 11/10/2015  . Atrial fibrillation (HCC)    paroxysmal  . Back pain 08/08/2016  . Biventricular  implantable cardiac defibrillator in situ    GDT CONTAK H170-MADIT-CRT-EXPLANTED 2011 implanted defibrillator- Guidant cognis, model n119-2001 Dr. Caryl Comes (11/28/2009)  . Breast tenderness    Spironolactone was stopped and eplerinone started. Patient feels better  . CAD (coronary artery disease)    CABG 1994  /   catheterization 1996, occluded vein graft to the diagonal, other grafts patent  /   catheterization 2006, no PCI  /  nuclear, October, 2011, extensive scar anteroseptal and apical, no ischemia  . Cardiomyopathy, ischemic 09/15/2008   nuclear scan November 23, 2009.  This revealed extensive old scar.  There was no significant ischemia.  Ejection fraction was 34%. Last cath 2006   . Carotid artery disease ([place])    Doppler, February,  2012, 0-39% bilateral, recommended followup 1 year  . CHF (congestive heart failure) (HCC)    chronic systolic  . Chronic systolic heart failure (HCC)    chronic systolic   . Chronotropic incompetence   . Cirrhosis, nonalcoholic ([place]) 5/64/3329  . Colon polyp   . Colonic polyp 2010   pathology not clear.   . COLONIC POLYPS 03/23/2007   Qualifier: Diagnosis of  By: Lenna Gilford MD, Deborra Medina   . Crohn's disease ([place])   . Crohn's disease of large intestine ([place])   . Degenerative joint disease   . Dermatitis 04/05/2016  . Dizziness    Positional dizziness  . Edema 09/02/2013  . Edema leg    Venous Dopplers 8/13: No DVT bilaterally  . Ejection fraction    EF 35%, echo, November, 2011  //   Is 35-40%, echo,  September 23, 2011   . Encounter for therapeutic drug monitoring 03/08/2017  . Fatigue 06/25/2008   Qualifier: Diagnosis of  By: [name], MD, Leonidas Romberg Dorinda Hill   . GERD 02/21/2007   Qualifier: Diagnosis of  By: Ronnald Ramp CNA/MA, Janett Billow    . GERD (gastroesophageal reflux disease)   . Hx of CABG    1994  . Hyperglycemia 08/08/2016  . Hyperlipemia   . Hypertension   . Hypertrophy of prostate with urinary obstruction and other lower urinary tract symptoms (LUTS)   .  Hypoalbuminemia 06/16/2013  . Hypokalemia 06/18/2013  . Implantable cardioverter-defibrillator (ICD) in situ 12/09/2009   Annotation: EXPLANTED 2011   //   New Guidant ICD implanted 2011     . Iron deficiency anemia   . Ischemic cardiomyopathy    CABG 1994 CAD catherization 1996.[name] occluded vein graft to the diagnol, other grafts patent/  catherization 2006, no PCI/ nuclear.[name] october 2011.[name] extensive scar anteroseptal and apical.. no ischemia    . LBBB (left bundle branch block)   . Left ankle pain 06/07/2013   The patient is having pain in his left ankle, May, 2015   . MCI (mild cognitive impairment) 02/27/2017  . 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 Mitral regurgitation    mild.[name] echo november 2011  EF 35%...echo.. november 2009/ ef 35%.[name] echo november 2011  . Oral thrush 02/05/2015  . OSA (obstructive sleep apnea) 09/16/2011   NPSG 2013:  AHI 34/hr.    . Polymyalgia rheumatica ([place])   . Post viral syndrome 02/27/2017  . Preventative health care 10/17/2016  . Shingles   . Sinus bradycardia   . Skin cancer 08/08/2016  . Thrombocytopenia ([place]) 2008  . Thrombocytopenia ([place]) 11/30/2011  . Thrush   . Tremor   . Ventral hernia    multiple, wears an abd binder, reports he hs been told he was at increased riisk for surgery  . Vitamin B 12 deficiency 11/06/2008   Qualifier: Diagnosis of  By: Lenna Gilford MD, Deborra Medina   . Vitamin B12 deficiency   . Vitamin D deficiency 11/06/2008   Qualifier: Diagnosis of  By: Lenna Gilford MD, Deborra Medina   . Warfarin anticoagulation    Coumadin therapy for mesenteric embolus 2003    Past Surgical History:  Procedure Laterality Date  . BIV ICD GENERATOR CHANGEOUT N/A 10/23/2017   Procedure: BIV ICD GENERATOR CHANGEOUT;  Surgeon: Deboraha Sprang, MD;  Location: [facility];  Service: Cardiovascular;  Laterality: N/A;  . CATARACT EXTRACTION, BILATERAL  2014  . COLONOSCOPY N/A 04/19/2013   Procedure: COLONOSCOPY;  Surgeon: Jerene Bears, MD;  [facility];  Service:  Endoscopy;  Laterality: N/A;  . CORONARY ARTERY BYPASS GRAFT  1994  . Elap w/ superior mesenteric artery embolectomy  1/03   by Dr. Jennette Banker  . ESOPHAGOGASTRODUODENOSCOPY N/A 04/18/2013   Procedure: ESOPHAGOGASTRODUODENOSCOPY (EGD);  Surgeon: Jerene Bears, MD;  [facility] END[facility]COPY;  Service: Endoscopy;  Laterality: N/A;  . implantation of biventric cardioverter-defibrillatorr     by Dr. Lovena Le in 8/06 Guidant Contak H170-explanted 2011  . implantation of guidant cognis device     model n119-2011  . laparoscopic cholecystectomy  2002  . left inguinal hernia repair with mesh  6/08   by Dr. Betsy Pries  . PROSTATE SURGERY  08/2012   by urology in [place]  . Repair of incarcerated ventral hernia and lysis of adhesions  11/03   by Dr. Betsy Pries    Current Outpatient Medications  Medication Sig Dispense Refill  .  carvedilol (COREG) 6.25 MG tablet TAKE 1 TABLET BY MOUTH TWICE DAILY WITH MEALS 180 tablet 3  . cholecalciferol (VITAMIN D) 1000 UNITS tablet Take 1,000 Units by mouth daily.     [name] donepezil (ARICEPT) 10 MG tablet Take 1 tablet (10 mg total) by mouth at bedtime. 90 tablet 3  . furosemide (LASIX) 40 MG tablet Take 1 tablet (40 mg total) by mouth 2 (two) times daily. Please keep upcoming appt in September before anymore refills. Thank you 180 tablet 0  . hydrocortisone cream 1 % Apply 1 application topically daily as needed for itching.    [name] lisinopril (PRINIVIL,ZESTRIL) 10 MG tablet TAKE 1 TABLET BY MOUTH ONCE DAILY 90 tablet 2  . memantine (NAMENDA) 10 MG tablet Take 1 tablet (10 mg total) by mouth 2 (two) times daily. 60 tablet 6  . oxymetazoline (AFRIN) 0.05 % nasal spray Place 1 spray into both nostrils daily as needed for congestion.    . pantoprazole (PROTONIX) 40 MG tablet Take 1 tablet (40 mg total) by mouth daily. TAKE 1 TABLET BY MOUTH ONCE DAILY AT  12  NOON 90 tablet 2  . potassium chloride SA (KLOR-CON M20) 20 MEQ tablet Take 1 tablet (20 mEq total) by mouth daily. 90 tablet 1   . simvastatin (ZOCOR) 20 MG tablet TAKE 1 TABLET BY MOUTH AT BEDTIME 90 tablet 2  . vitamin B-12 (CYANOCOBALAMIN) 1000 MCG tablet Take 1,000 mcg by mouth daily.     [name] warfarin (COUMADIN) 5 MG tablet TAKE AS DIRECTED BY COUMADIN CLINIC 100 tablet 1   No current facility-administered medications for this visit.     No Known Allergies  Review of Systems negative except from HPI and PMH  Physical Exam BP 110/76   Pulse 61   Ht 6' 1"  (1.854 m)   Wt 189 lb (85.7 kg)   SpO2 94%   BMI 24.94 kg/m  Well developed and nourished in no acute distress HENT normal Neck supple with JVP-flat Clear Regular rate and rhythm, no murmurs or gallops Abd-soft with active BS hernia  No Clubbing cyanosis 1+edema Skin-warm and dry A & Oriented  Grossly normal sensory and motor function   ECG AV pacing upright QRS V1  And neg QRS 1   Assessment and  Plan  Ischemic cardiomyopathy  Implantable defibrillator-[employer] CRT --Batteryfailure now replaced The patient's device was interrogated.  The information was reviewed. No changes were made in the programming.     Congestive heart-chronic-systolic     Without symptoms of ischemia  Mildly volume overloaded.  We will continue to give him as needed extra furosemide.  Continue current meds

## 2018-02-09 NOTE — Patient Instructions (Signed)
Description   Continue same dose of coumadin 1 tablet daily except 1/2 tablet on Tuesdays and Saturdays.  Recheck INR in 6 weeks. Call our office if you have any concerns or are placed on any new medications [phone].  keep intake of greens consistent

## 2018-02-12 ENCOUNTER — Ambulatory Visit (INDEPENDENT_AMBULATORY_CARE_PROVIDER_SITE_OTHER): Payer: Medicare HMO

## 2018-02-12 DIAGNOSIS — I5022 Chronic systolic (congestive) heart failure: Secondary | ICD-10-CM

## 2018-02-12 DIAGNOSIS — I255 Ischemic cardiomyopathy: Secondary | ICD-10-CM | POA: Diagnosis not present

## 2018-02-12 NOTE — Progress Notes (Signed)
Remote ICD transmission.   

## 2018-02-14 LAB — CUP PACEART REMOTE DEVICE CHECK
Battery Remaining Longevity: 120 mo
Battery Remaining Percentage: 100 %
HighPow Impedance: 41 Ohm
Implantable Lead Implant Date: 20060808
Implantable Lead Location: 753859
Implantable Lead Location: 753860
Implantable Lead Model: 158
Implantable Lead Serial Number: 165392
Lead Channel Impedance Value: 435 Ohm
Lead Channel Impedance Value: 564 Ohm
Lead Channel Setting Pacing Amplitude: 2.4 V
Lead Channel Setting Pacing Pulse Width: 0.4 ms
Lead Channel Setting Sensing Sensitivity: 1 mV
MDC IDC LEAD IMPLANT DT: 20060808
MDC IDC LEAD IMPLANT DT: 20060911
MDC IDC LEAD LOCATION: 753858
MDC IDC LEAD SERIAL: 261368
MDC IDC MSMT LEADCHNL LV IMPEDANCE VALUE: 351 Ohm
MDC IDC PG IMPLANT DT: 20190923
MDC IDC SESS DTM: 20200113062100
MDC IDC SET LEADCHNL LV PACING AMPLITUDE: 2 V
MDC IDC SET LEADCHNL LV PACING PULSEWIDTH: 0.8 ms
MDC IDC SET LEADCHNL RA PACING AMPLITUDE: 2.3 V
MDC IDC SET LEADCHNL RV SENSING SENSITIVITY: 0.5 mV
MDC IDC STAT BRADY RA PERCENT PACED: 93 %
MDC IDC STAT BRADY RV PERCENT PACED: 94 %
Pulse Gen Serial Number: 134188

## 2018-03-03 ENCOUNTER — Other Ambulatory Visit: Payer: Self-pay | Admitting: Cardiology

## 2018-03-08 ENCOUNTER — Other Ambulatory Visit: Payer: Self-pay | Admitting: Family Medicine

## 2018-03-09 ENCOUNTER — Encounter: Payer: Self-pay | Admitting: Internal Medicine

## 2018-03-09 ENCOUNTER — Other Ambulatory Visit (INDEPENDENT_AMBULATORY_CARE_PROVIDER_SITE_OTHER): Payer: Medicare HMO

## 2018-03-09 ENCOUNTER — Ambulatory Visit: Payer: Medicare HMO | Admitting: Internal Medicine

## 2018-03-09 ENCOUNTER — Telehealth: Payer: Self-pay | Admitting: Internal Medicine

## 2018-03-09 VITALS — BP 124/58 | HR 72 | Ht 73.0 in | Wt 191.0 lb

## 2018-03-09 DIAGNOSIS — K7460 Unspecified cirrhosis of liver: Secondary | ICD-10-CM

## 2018-03-09 DIAGNOSIS — K5080 Crohn's disease of both small and large intestine without complications: Secondary | ICD-10-CM | POA: Diagnosis not present

## 2018-03-09 DIAGNOSIS — Z85820 Personal history of malignant melanoma of skin: Secondary | ICD-10-CM | POA: Diagnosis not present

## 2018-03-09 LAB — COMPREHENSIVE METABOLIC PANEL
ALK PHOS: 85 U/L (ref 39–117)
ALT: 12 U/L (ref 0–53)
AST: 24 U/L (ref 0–37)
Albumin: 3.6 g/dL (ref 3.5–5.2)
BILIRUBIN TOTAL: 2.3 mg/dL — AB (ref 0.2–1.2)
BUN: 8 mg/dL (ref 6–23)
CO2: 32 mEq/L (ref 19–32)
Calcium: 9 mg/dL (ref 8.4–10.5)
Chloride: 106 mEq/L (ref 96–112)
Creatinine, Ser: 1.04 mg/dL (ref 0.40–1.50)
GFR: 67.91 mL/min (ref >60.00)
Glucose, Bld: 81 mg/dL (ref 70–99)
Potassium: 4.2 mEq/L (ref 3.5–5.1)
Sodium: 144 mEq/L (ref 135–145)
Total Protein: 5.4 g/dL — ABNORMAL LOW (ref 6.0–8.3)

## 2018-03-09 LAB — CBC WITH DIFFERENTIAL/PLATELET
Basophils Absolute: 0 10*3/uL (ref 0.0–0.1)
Basophils Relative: 0.3 % (ref 0.0–3.0)
Eosinophils Absolute: 0.1 10*3/uL (ref 0.0–0.7)
Eosinophils Relative: 1.7 % (ref 0.0–5.0)
HCT: 40.4 % (ref 39.0–52.0)
Hemoglobin: 13.7 g/dL (ref 13.0–17.0)
Lymphocytes Relative: 47.2 % — ABNORMAL HIGH (ref 12.0–46.0)
Lymphs Abs: 3.4 10*3/uL (ref 0.7–4.0)
MCHC: 33.9 g/dL (ref 30.0–36.0)
MCV: 90.7 fl (ref 78.0–100.0)
Monocytes Absolute: 0.8 10*3/uL (ref 0.1–1.0)
Monocytes Relative: 10.8 % (ref 3.0–12.0)
Neutro Abs: 2.9 10*3/uL (ref 1.4–7.7)
Neutrophils Relative %: 40 % — ABNORMAL LOW (ref 43.0–77.0)
Platelets: 57 10*3/uL — ABNORMAL LOW (ref 150.0–400.0)
RBC: 4.46 Mil/uL (ref 4.22–5.81)
RDW: 15.2 % (ref 11.5–15.5)
WBC: 7.3 10*3/uL (ref 4.0–10.5)

## 2018-03-09 NOTE — Telephone Encounter (Signed)
Dermatology called and said pt was referred but was a no show.

## 2018-03-09 NOTE — Telephone Encounter (Signed)
Dr Hilarie Fredrickson- FYI.Marland KitchenMarland KitchenMarland Kitchen

## 2018-03-09 NOTE — Progress Notes (Signed)
Subjective:    Patient ID: [name], male    DOB: [birth year], 85 y.o.   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  He is here today with his wife.  He was last seen in the office on 08/20/2014.  He reports that he has been doing well.  He denies issues with abdominal swelling.  Some mild lower extremity edema which seems to come and go.  His wife notes that his memory has been not as good as it has been in years past and she is worried about mild dementia.  He does not feel that this is an issue today.  She reports he also does not always wear his hearing aids and so he misses conversations.  No evidence of bleeding or confusion.  No jaundice.  He does continue to have at times urgent bowel movements and can have accidents.  This happens mostly in the morning.  No rectal bleeding or melena.  He uses Imodium most mornings which seems to help.  He remains quite active and works outside.  He does have a history of melanoma and was followed by Dr. Allyson Sabal prior to retirement.  He requests a dermatology referral today.   Review of Systems As per HPI, otherwise negative  Current Medications, Allergies, Past Medical History, Past Surgical History, Family History and Social History were reviewed in [employer] record.     Objective:   Physical Exam BP (!) 124/58   Pulse 72   Ht 6' 1"  (1.854 m)   Wt 191 lb (86.6 kg)   BMI 25.20 kg/m  Constitutional: Well-developed and well-nourished. No distress. HEENT: Normocephalic and atraumatic.  Conjunctivae are normal.  No scleral icterus. Neck: Neck supple. Trachea midline. Cardiovascular: Normal rate, regular rhythm and intact distal pulses.  Pulmonary/chest: Effort normal and  breath sounds normal. No wheezing, rales or rhonchi. Abdominal: Soft, nontender, nondistended. Bowel sounds active throughout. abd wall hernias soft Extremities: no clubbing, cyanosis, or edema Neurological: Alert and oriented to person place and time. No asterixis Skin: Skin is warm and dry.  Probable small basal cell lesion on the bridge of the nose Psychiatric: Normal mood and affect. Behavior is normal.  CBC    Component Value Date/Time   WBC 6.0 10/04/2017 1157   WBC 5.9 02/27/2017 1103   RBC 4.64 10/04/2017 1157   RBC 5.03 02/27/2017 1103   HGB 13.8 10/04/2017 1157   HCT 43.1 10/04/2017 1157   PLT 68 (LL) 10/04/2017 1157   MCV 93 10/04/2017 1157   MCH 29.7 10/04/2017 1157   MCH 29.9 09/03/2016 1400   MCHC 32.0 10/04/2017 1157   MCHC 34.3 02/27/2017 1103   RDW 15.6 (H) 10/04/2017 1157   LYMPHSABS 2.6 02/27/2017 1103   MONOABS 0.6 02/27/2017 1103   EOSABS 0.1 02/27/2017 1103   BASOSABS 0.0 02/27/2017 1103   CMP     Component Value Date/Time   NA 144 10/04/2017 1157   K 4.2 10/04/2017 1157   CL 104 10/04/2017 1157   CO2 27 10/04/2017 1157   GLUCOSE 88 10/04/2017 1157   GLUCOSE 106 (H) 02/27/2017 1103   BUN 10 10/04/2017 1157   CREATININE 1.05 10/04/2017 1157   CREATININE 1.09 06/10/2013 1228   CALCIUM 9.1 10/04/2017 1157   PROT  6.0 02/27/2017 1103   ALBUMIN 3.7 02/27/2017 1103   AST 26 02/27/2017 1103   ALT 14 02/27/2017 1103   ALT 14 01/09/2014 1138   ALKPHOS 100 02/27/2017 1103   BILITOT 2.6 (H) 02/27/2017 1103   GFRNONAA 65 10/04/2017 1157   GFRAA 75 10/04/2017 1157  ABDOMEN ULTRASOUND COMPLETE   COMPARISON:  Abdominal ultrasound of March 31, 2017   FINDINGS: Gallbladder: The gallbladder is surgically absent.   Common bile duct: Diameter: 3.5 mm   Liver: The hepatic echotexture is heterogeneously increased. There is a small amount of peri hepatic fluid superior to the liver. The surface contour of the liver is irregular. There is no discrete mass or  ductal dilation. Portal vein is patent on color Doppler imaging with normal direction of blood flow towards the liver.   IVC: Limited visualization of the IVC due to bowel gas.   Pancreas: The pancreas was obscured by bowel gas.   Spleen: 12.26 cm with calculated volume of 998 cc.   Right Kidney: Length: 9.93 cm. The renal cortical echotexture is mildly increased. There is an upper pole cyst measuring 1.8 x 1.9 x 2.0 cm. There is no hydronephrosis.   Left Kidney: Length: 10.45 cm. The renal cortical echotexture is mildly increased. There is no hydronephrosis.   Abdominal aorta: No aneurysm visualized.   Other findings: None.   IMPRESSION: Heterogeneously increased hepatic echotexture with surface contour irregularity consistent with cirrhosis. Small amount of perihepatic ascites. No suspicious hepatic masses. Previous cholecystectomy.   Splenomegaly.   Mildly increased renal cortical echotexture bilaterally without hydronephrosis.     Electronically Signed   By: [name]  Martinique M.D.   On: 10/30/2017 11:05        Assessment & Plan:  85 year old male with a history of cryptogenic cirrhosis likely from NASH, Crohn's enteritis status post resection with possible right colonic involvement, ischemic heart failure on warfarin, history of iron deficiency anemia who is here for follow-up.   1.  Cryptogenic cirrhosis with portal hypertension --likely secondary to nonalcoholic fatty liver disease.  He does have mild thrombocytopenia and splenomegaly.  No evidence of HCC by recent ultrasound. --[place] screening --up-to-date but will need repeat ultrasound next month --Low-sodium diet --CBC and CMP today.  INR affected by warfarin use --No evidence for hepatic encephalopathy --Variceal screening, deferred today by patient given need for endoscopy --No evidence of decompensated disease  2.  Crohn's disease ileum and probable right colon/chronic loose stools--he does have loose stools  which may relate to this condition.  Given overall mild nature I am hesitant to begin therapy.  He can continue Imodium 2 mg in the morning and I asked that he had a bedtime dose as well.  Also recommend that he begin back Benefiber which was helping in the past, 2 tablespoons daily  3.  History of melanoma/skin lesion on nose --referral to [facility] dermatology  Follow-up in the office in 6 to 12 months, sooner if needed 25 minutes spent with the patient today. Greater than 50% was spent in counseling and coordination of care with the patient

## 2018-03-09 NOTE — Patient Instructions (Addendum)
Please purchase the following medications over the counter and take as directed: Benefiber 1 tablespoon, working your way to 2 tablespoons daily Imodium, 1 tablet every morning, 1 tablet every night.  Your provider has requested that you go to the basement level for lab work before leaving today. Press "B" on the elevator. The lab is located at the first door on the left as you exit the elevator.  You have been scheduled for an abdominal ultrasound at [facility] (1st floor of hospital) on 04/04/2018 at 9:00 am. Please arrive 15 minutes prior to your appointment for registration. Make certain not to have anything to eat or drink 6 hours prior to your appointment. Should you need to reschedule your appointment, please contact radiology at [phone]. This test typically takes about 30 minutes to perform.  We have been scheduled for an appointment today at 3 PM at [facility] with Dr Elvera Lennox.

## 2018-03-12 ENCOUNTER — Ambulatory Visit (INDEPENDENT_AMBULATORY_CARE_PROVIDER_SITE_OTHER): Payer: Medicare HMO

## 2018-03-12 DIAGNOSIS — Z95810 Presence of automatic (implantable) cardiac defibrillator: Secondary | ICD-10-CM | POA: Diagnosis not present

## 2018-03-12 DIAGNOSIS — I5022 Chronic systolic (congestive) heart failure: Secondary | ICD-10-CM | POA: Diagnosis not present

## 2018-03-12 NOTE — Progress Notes (Signed)
EPIC Encounter for ICM Monitoring  Patient Name: [name] is a 85 y.o. male Date: 03/12/2018 Primary Care Physican: [name], MD Primary Cardiologist:Cooper/Weaver, PA Electrophysiologist: Caryl Comes Last Weight:186lbs Today's Weight:unknown Right Ventricular:92% Left Ventricular:91%  AT/AF Overview Since Last Reset Feb 09, 2018  Atrial Arrhythmia  AT/AF 4 % (was <1% on 01/11/2018 report)  AT/AF Events 3   Total Time in AT/AF (days) 1.3   Most Recent (Feb 27, 2018 19:07)           Avg V Rate, Duration (hh:mm:ss) 76 bpm, 29:22:12   Longest (Feb 27, 2018 19:07)                   Avg V Rate, Duration (hh:mm:ss) 76 bpm, 29:22:12   Fastest RVS Rate (Feb 27, 2018 19:07)    Max V Rate, Duration (hh:mm:ss) 127 bpm, 29:22:12  Spoke with wife.  Patient is doing well and no complaints at this time.    HeartLogic Heart Failure Index is0 suggesting normal.  Prescribed:Furosemide 40 mg 1 tablet twice a day (Dr Olin Pia office note 02/09/2018 states will continue to give him extra Furosemide as needed extra). Potassium 20 mEq 1 tablet daily  Recommendations: No changes. Encouraged to call for fluid symptoms.  Follow-up plan: ICM clinic phone appointment on3/16/2020.  Office appt with Richardson Dopp, PA 04/03/2018  Copy of ICM check sent to Dr. Caryl Comes.    Rosalene Billings, RN 03/12/2018 8:02 AM

## 2018-03-23 ENCOUNTER — Ambulatory Visit: Payer: Medicare HMO

## 2018-03-23 DIAGNOSIS — K55069 Acute infarction of intestine, part and extent unspecified: Secondary | ICD-10-CM

## 2018-03-23 DIAGNOSIS — Z0000 Encounter for general adult medical examination without abnormal findings: Secondary | ICD-10-CM

## 2018-03-23 DIAGNOSIS — Z5181 Encounter for therapeutic drug level monitoring: Secondary | ICD-10-CM | POA: Diagnosis not present

## 2018-03-23 LAB — POCT INR: INR: 3.1 — AB (ref 2.0–3.0)

## 2018-03-23 NOTE — Patient Instructions (Signed)
Description   Resume taking 1 tablet daily except 1/2 tablet on Tuesdays and Saturdays.  Recheck INR in 6 weeks. Call our office if you have any concerns or are placed on any new medications [phone].  keep intake of greens consistent

## 2018-03-31 ENCOUNTER — Other Ambulatory Visit: Payer: Self-pay | Admitting: Pulmonary Disease

## 2018-04-02 ENCOUNTER — Encounter: Payer: Self-pay | Admitting: Physician Assistant

## 2018-04-02 ENCOUNTER — Ambulatory Visit: Payer: Medicare HMO | Admitting: Physician Assistant

## 2018-04-02 DIAGNOSIS — I480 Paroxysmal atrial fibrillation: Secondary | ICD-10-CM | POA: Insufficient documentation

## 2018-04-02 NOTE — Progress Notes (Signed)
Cardiology Office Note:    Date:  04/03/2018   ID:  [name], DOB 10-03-1931, MRN [id number]  PCP:  [name], MD  Cardiologist:  Sherren Mocha, MD / Richardson Dopp, PA-C  Electrophysiologist:  Virl Axe, MD  Vascular Surgery:  Servando Snare, MD  Referring MD: [name], MD   Chief Complaint  Patient presents with  . Follow-up    CAD, CHF    History of Present Illness:    [name] is a 85 y.o. male with coronary artery disease s/p CABG, systolic congestive heart failure secondary to ischemic cardiomyopathy, s/p CRT-D, paroxysmal atrial fibrillation, abdominal aortic aneurysm (followed by vascular surgery - 3.4 cm by CT in (05/2016), hypertension, hyperlipidemia.  He is on chronic anticoagulation with Warfarin which is followed in our Coumadin Clinic.  He was last seen by me in 10/2017.  He had a generator change for his device in 10/2017 and saw Dr. Caryl Comes in 01/2018.    [name] returns for follow-up.  He is here with his wife.  He has not had any chest discomfort reminiscent of his previous angina.  He gets short of breath with more extreme activities.  He has not had orthopnea or paroxysmal nocturnal dyspnea.  He has not had significant lower extremity swelling.  He has not had syncope or shocks from his defibrillator.  Prior CV studies:   The following studies were reviewed today:  Echo 11/03/2014 Inferolateral hypokinesis, moderate concentric LVH, EF 35-40, normal wall motion, grade 1 diastolic dysfunction, moderately dilated LA, normal RVSF  Carotid US 10/07/2014 Bilateral ICA 1-39 Follow-up 1 year  Nuclear stress test 11/23/2009 No ischemia, EF 34, extensive anterior, anterolateral, anteroseptal, apical scar  Cardiac catheterization 07/19/2004 LM 30 LAD proximal 100 OM1 mid 80 RCA proximal 100 SVG-DX 100 SVG-PDA/PLA mid and distal 30-40 LIMA-LAD patent EF 15  Past Medical History:  Diagnosis Date  . AAA (abdominal aortic aneurysm) ([place])   .  Abdominal pain 04/14/2011  . Acute bronchitis 09/02/2013  . Acute vascular insufficiency of intestine (HCC)    Mesenteric embolus...surgery 2003  . Allergic rhinitis 09/09/2013  . Anxiety   . Aortic atherosclerosis ([place]) 11/10/2015  . Atrial fibrillation (HCC)    paroxysmal  . Back pain 08/08/2016  . Biventricular implantable cardiac defibrillator in situ    GDT CONTAK H170-MADIT-CRT-EXPLANTED 2011 implanted defibrillator- Guidant cognis, model n119-2001 Dr. Caryl Comes (11/28/2009)  . Breast tenderness    Spironolactone was stopped and eplerinone started. Patient feels better  . CAD (coronary artery disease)    CABG 1994  /   catheterization 1996, occluded vein graft to the diagonal, other grafts patent  /   catheterization 2006, no PCI  /  nuclear, October, 2011, extensive scar anteroseptal and apical, no ischemia  . Carotid artery disease ([place])    Doppler, February,  2012, 0-39% bilateral, recommended followup 1 year  . Chronic systolic heart failure (HCC)    chronic systolic   . Chronotropic incompetence   . Cirrhosis, nonalcoholic ([place]) 07/31/1599  . Colonic polyp 2010   pathology not clear.   . Crohn's disease ([place])   . Degenerative joint disease   . Dermatitis 04/05/2016  . Dizziness    Positional dizziness  . Edema leg    Venous Dopplers 8/13: No DVT bilaterally  . Ejection fraction    EF 35%, echo, November, 2011  //   Is 35-40%, echo,  September 23, 2011   . Fatigue 06/25/2008   Qualifier: Diagnosis of  ByRon Parker, MD, [facility], Dorinda Hill   . GERD 02/21/2007   Qualifier: Diagnosis of  By: Ronnald Ramp CNA/MA, Janett Billow    . Hyperglycemia 08/08/2016  . Hyperlipemia   . Hypertension   . Hypertrophy of prostate with urinary obstruction and other lower urinary tract symptoms (LUTS)   . Hypoalbuminemia 06/16/2013  . Hypokalemia 06/18/2013  . Implantable cardioverter-defibrillator (ICD) in situ 12/09/2009   Annotation: EXPLANTED 2011   //   New Guidant ICD implanted 2011     . Iron deficiency anemia     . Ischemic cardiomyopathy    CABG 1994 CAD catherization 1996.[name] occluded vein graft to the diagnol, other grafts patent/  catherization 2006, no PCI/ nuclear.[name] october 2011.[name] extensive scar anteroseptal and apical.. no ischemia    . LBBB (left bundle branch block)   . MCI (mild cognitive impairment) 02/27/2017  . 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 Mitral regurgitation    mild.[name] echo november 2011  EF 35%...echo.. november 2009/ ef 35%.[name] echo november 2011  . Oral thrush 02/05/2015  . OSA (obstructive sleep apnea) 09/16/2011   NPSG 2013:  AHI 34/hr.    . Polymyalgia rheumatica ([place])   . Shingles   . Sinus bradycardia   . Skin cancer 08/08/2016  . Thrombocytopenia ([place]) 2008  . Thrush   . Tremor   . Ventral hernia    multiple, wears an abd binder, reports he hs been told he was at increased riisk for surgery  . Vitamin B12 deficiency   . Vitamin D deficiency 11/06/2008   Qualifier: Diagnosis of  By: Lenna Gilford MD, Deborra Medina   . Warfarin anticoagulation    Coumadin therapy for mesenteric embolus 2003   Surgical Hx: The patient  has a past surgical history that includes Coronary artery bypass graft (1994); laparoscopic cholecystectomy (2002); Elap w/ superior mesenteric artery embolectomy (1/03); Repair of incarcerated ventral hernia and lysis of adhesions (11/03); implantation of biventric cardioverter-defibrillatorr; implantation of guidant cognis device; left inguinal hernia repair with mesh (6/08); Prostate surgery (08/2012); Esophagogastroduodenoscopy (N/A, 04/18/2013); Colonoscopy (N/A, 04/19/2013); Cataract extraction, bilateral (2014); and BIV ICD GENERATOR CHANGEOUT (N/A, 10/23/2017).   Current Medications: Current Meds  Medication Sig  . Ascorbic Acid (VITAMIN C) 100 MG tablet Take 100 mg by mouth daily.  . carvedilol (COREG) 6.25 MG tablet TAKE 1 TABLET BY MOUTH TWICE DAILY WITH MEALS  . cholecalciferol (VITAMIN D) 1000 UNITS tablet Take 1,000 Units by mouth daily.   [name] donepezil  (ARICEPT) 10 MG tablet Take 1 tablet (10 mg total) by mouth at bedtime.  . ferrous sulfate 325 (65 FE) MG tablet Take 325 mg by mouth daily with breakfast.  . hydrocortisone cream 1 % Apply 1 application topically daily as needed for itching.  [name] lisinopril (PRINIVIL,ZESTRIL) 10 MG tablet TAKE 1 TABLET BY MOUTH ONCE DAILY  . memantine (NAMENDA) 10 MG tablet Take 1 tablet (10 mg total) by mouth 2 (two) times daily.  [name] oxymetazoline (AFRIN) 0.05 % nasal spray Place 1 spray into both nostrils daily as needed for congestion.  . pantoprazole (PROTONIX) 40 MG tablet Take 1 tablet (40 mg total) by mouth daily. TAKE 1 TABLET BY MOUTH ONCE DAILY AT  12  NOON  . potassium chloride SA (K-DUR,KLOR-CON) 20 MEQ tablet TAKE 1 TABLET BY MOUTH ONCE DAILY  . simvastatin (ZOCOR) 20 MG tablet TAKE 1 TABLET BY MOUTH AT BEDTIME  . vitamin B-12 (CYANOCOBALAMIN) 1000 MCG tablet Take 1,000 mcg by mouth daily.   [name] warfarin (COUMADIN) 5 MG tablet  Take as directed by the Coumadin Clinic.     Allergies:   Patient has no known allergies.   Social History   Tobacco Use  . Smoking status: Former Smoker    Packs/day: 1.00    Years: 20.00    Pack years: 20.00    Types: Cigarettes    Last attempt to quit: 01/31/1973    Years since quitting: 45.2  . Smokeless tobacco: Never Used  Substance Use Topics  . Alcohol use: Yes    Alcohol/week: 0.0 standard drinks    Comment: occasional  . Drug use: No     Family Hx: The patient's family history includes Appendicitis in his brother; Cirrhosis in his brother; Dementia in his sister; Diabetes in his son; Edema in his sister; Heart disease in his mother, sister, sister, and sister; Heart disease (age of onset: 89) in his father; Heart failure in his sister; Hyperlipidemia in an other family member; Hypertension in his brother, father, sister, and another family member; Kidney disease in his brother; Leukemia in his brother; Myasthenia gravis in his brother; Tremor in his  sister.  ROS:   Please see the history of present illness.    ROS All other systems reviewed and are negative.   EKGs/Labs/Other Test Reviewed:    EKG:  EKG is not ordered today.   Recent Labs: 03/09/2018: ALT 12; BUN 8; Creatinine, Ser 1.04; Hemoglobin 13.7; Platelets 57.0; Potassium 4.2; Sodium 144   Recent Lipid Panel Lab Results  Component Value Date/Time   CHOL 131 08/08/2016 11:24 AM   TRIG 106.0 08/08/2016 11:24 AM   HDL 52.10 08/08/2016 11:24 AM   CHOLHDL 3 08/08/2016 11:24 AM   LDLCALC 58 08/08/2016 11:24 AM   LDLDIRECT 81.6 12/30/2013 12:13 PM    Physical Exam:    VS:  BP 124/68   Pulse 60   Ht 6' (1.829 m)   Wt 185 lb (83.9 kg)   BMI 25.09 kg/m     Wt Readings from Last 3 Encounters:  04/03/18 185 lb (83.9 kg)  03/09/18 191 lb (86.6 kg)  02/09/18 189 lb (85.7 kg)     Physical Exam  Constitutional: He is oriented to person, place, and time. He appears well-developed and well-nourished. No distress.  HENT:  Head: Normocephalic and atraumatic.  Eyes: No scleral icterus.  Neck: No JVD present. No thyromegaly present.  Cardiovascular: Normal rate and regular rhythm.  No murmur heard. Pulmonary/Chest: Effort normal. He has no rales.  Abdominal: Soft.  Musculoskeletal:        General: No edema.  Lymphadenopathy:    He has no cervical adenopathy.  Neurological: He is alert and oriented to person, place, and time.  Skin: Skin is warm and dry.  Psychiatric: He has a normal mood and affect.    ASSESSMENT & PLAN:    Chronic systolic heart failure (HCC) EF 35-40 by echo in 2016.  He is NYHA 2.  Continue current management which includes beta-blocker, ACE inhibitor.  Paroxysmal atrial fibrillation (HCC) Continue anticoagulation with Coumadin.  He is followed in our Coumadin clinic.  I refilled his prescription for him today.  Coronary artery disease involving native coronary artery of native heart without angina pectoris History of CABG.  Cardiac  catheterization in 2006 with 2/3 bypass grafts patent.  Nuclear stress test in 2011 demonstrated no ischemia.  He is not having anginal symptoms.  He is not on aspirin as he is on Coumadin.  Continue simvastatin.  Aortic atherosclerosis ([place]) As noted, he  is not on aspirin as he is on Coumadin.  Continue simvastatin.  Essential hypertension The patient's blood pressure is controlled on his current regimen.  Continue current therapy.   Implantable cardioverter-defibrillator (ICD) in situ   Follow-up with EP as planned.   Dispo:  Return in about 6 months (around 10/04/2018) for Routine Follow Up, w/ Dr. Burt Knack.   Medication Adjustments/Labs and Tests Ordered: Current medicines are reviewed at length with the patient today.  Concerns regarding medicines are outlined above.  Tests Ordered: No orders of the defined types were placed in this encounter.  Medication Changes: Meds ordered this encounter  Medications  . warfarin (COUMADIN) 5 MG tablet    Sig: Take as directed by the Coumadin Clinic.    Dispense:  100 tablet    Refill:  2    90 day supply    Order Specific Question:   Supervising Provider    Answer:   Constance Haw [[id number]]  . DISCONTD: furosemide (LASIX) 40 MG tablet    Sig: Take 1 tablet (40 mg total) by mouth 2 (two) times daily.    Dispense:  180 tablet    Refill:  3    Reorder - Do not fill until the patient calls for refill.    Order Specific Question:   Supervising Provider    Answer:   Constance Haw [[id number]]  . furosemide (LASIX) 40 MG tablet    Sig: Take 1 tablet (40 mg total) by mouth 2 (two) times daily.    Dispense:  180 tablet    Refill:  3    Reorder - Do not fill until the patient calls for refill.    Signed, Richardson Dopp, PA-C  04/03/2018 1:04 PM    [facility] [address], [place], [place]  [address] Phone: [phone]; Fax: [phone]

## 2018-04-03 ENCOUNTER — Ambulatory Visit: Payer: Medicare HMO | Admitting: Physician Assistant

## 2018-04-03 ENCOUNTER — Encounter: Payer: Self-pay | Admitting: Physician Assistant

## 2018-04-03 VITALS — BP 124/68 | HR 60 | Ht 72.0 in | Wt 185.0 lb

## 2018-04-03 DIAGNOSIS — I10 Essential (primary) hypertension: Secondary | ICD-10-CM | POA: Diagnosis not present

## 2018-04-03 DIAGNOSIS — I700 Atherosclerosis of aorta: Secondary | ICD-10-CM

## 2018-04-03 DIAGNOSIS — I480 Paroxysmal atrial fibrillation: Secondary | ICD-10-CM | POA: Diagnosis not present

## 2018-04-03 DIAGNOSIS — I2510 Atherosclerotic heart disease of native coronary artery without angina pectoris: Secondary | ICD-10-CM | POA: Diagnosis not present

## 2018-04-03 DIAGNOSIS — I5022 Chronic systolic (congestive) heart failure: Secondary | ICD-10-CM

## 2018-04-03 DIAGNOSIS — Z95810 Presence of automatic (implantable) cardiac defibrillator: Secondary | ICD-10-CM

## 2018-04-03 MED ORDER — WARFARIN SODIUM 5 MG PO TABS: ORAL_TABLET | ORAL | 2 refills | Status: DC

## 2018-04-03 MED ORDER — FUROSEMIDE 40 MG PO TABS: 40.0000 mg | ORAL_TABLET | Freq: Two times a day (BID) | ORAL | 3 refills | Status: DC

## 2018-04-03 NOTE — Patient Instructions (Signed)
Medication Instructions:   Your physician recommends that you continue on your current medications as directed. Please refer to the Current Medication list given to you today.   If you need a refill on your cardiac medications before your next appointment, please call your pharmacy.   Lab work: NONE ORDERED  TODAY    If you have labs (blood work) drawn today and your tests are completely normal, you will receive your results only by: [name] MyChart Message (if you have MyChart) OR . A paper copy in the mail If you have any lab test that is abnormal or we need to change your treatment, we will call you to review the results.  Testing/Procedures: NONE ORDERED  TODAY     Follow-Up: At [facility], you and your health needs are our priority.  As part of our continuing mission to provide you with exceptional heart care, we have created designated Provider Care Teams.  These Care Teams include your primary Cardiologist (physician) and Advanced Practice Providers (APPs -  Physician Assistants and Nurse Practitioners) who all work together to provide you with the care you need, when you need it. You will need a follow up appointment in:  6 months.  Please call our office 2 months in advance to schedule this appointment.  You may see Sherren Mocha, MD or one of the following Advanced Practice Providers on your designated Care Team: Richardson Dopp, PA-C [place], [place] . Daune Perch, NP  Any Other Special Instructions Will Be Listed Below (If Applicable).

## 2018-04-04 ENCOUNTER — Ambulatory Visit (HOSPITAL_COMMUNITY)
Admission: RE | Admit: 2018-04-04 | Discharge: 2018-04-04 | Disposition: A | Discharge status: Home / Self Care | Payer: Medicare HMO | Source: Ambulatory Visit | Attending: Internal Medicine | Admitting: Internal Medicine

## 2018-04-04 DIAGNOSIS — K7460 Unspecified cirrhosis of liver: Secondary | ICD-10-CM | POA: Diagnosis not present

## 2018-04-09 ENCOUNTER — Other Ambulatory Visit: Payer: Self-pay | Admitting: Family Medicine

## 2018-04-09 NOTE — Telephone Encounter (Signed)
Requested medication (s) are due for refill today: Yes  Requested medication (s) are on the active medication list: Yes  Last refill:  2019  Future visit scheduled: Yes  Notes to clinic:  All refilled last by another provider, patient is out of medications, multiple requests, see notes below encounter.     Requested Prescriptions  Pending Prescriptions Disp Refills   donepezil (ARICEPT) 10 MG tablet 90 tablet 3    Sig: Take 1 tablet (10 mg total) by mouth at bedtime.     Neurology:  Alzheimer's Agents Failed - 04/09/2018  1:43 PM      Failed - Valid encounter within last 6 months    Recent Outpatient Visits          1 year ago Acute bacterial bronchitis   [facility] [place], [place], [place]   1 year ago Preventative health care   [place] [facility] [place], [place]   1 year ago Essential hypertension   [facility] at [place], [place]   2 years ago Hyperglycemia   [facility] at [place], [place]   2 years ago Thrombocytopenia [facility])   [place] [facility] [place], [place]            lisinopril (PRINIVIL,ZESTRIL) 10 MG tablet 90 tablet 2    Sig: Take 1 tablet (10 mg total) by mouth daily.     Cardiovascular:  ACE Inhibitors Failed - 04/09/2018  1:43 PM      Failed - Valid encounter within last 6 months    Recent Outpatient Visits          1 year ago Acute bacterial bronchitis   [facility] at [employer], [place], [place]   1 year ago Preventative health care   [place] at [place], [place]   1 year ago Essential hypertension   [facility] at [place], [place]   2 years ago Hyperglycemia   [facility] at [place], [place]   2 years ago Thrombocytopenia [facility])   [place] at [place], [place]             Passed - Cr in normal range and within 180 days    Creat  Date Value Ref Range Status  06/10/2013 1.09 0.50 - 1.35 mg/dL Final   Creatinine, Ser  Date Value Ref Range Status  03/09/2018 1.04 0.40 - 1.50 mg/dL Final         Passed - K in normal range and within 180 days    Potassium  Date Value Ref Range Status  03/09/2018 4.2 3.5 - 5.1 mEq/L Final         Passed - Patient is not pregnant      Passed - Last BP in normal range    BP Readings from Last 1 Encounters:  04/03/18 124/68        memantine (NAMENDA) 10 MG tablet 60 tablet 6    Sig: Take 1 tablet (10 mg total) by mouth 2 (two) times daily.     Neurology:  Alzheimer's Agents Failed - 04/09/2018  1:43 PM      Failed - Valid encounter within last  6 months    Recent Outpatient Visits          1 year ago Acute bacterial bronchitis   [facility] at [employer], [place], [place]   1 year ago Preventative health care   [place] at [place], [place]   1 year ago Essential hypertension   [facility] at [place], [place]   2 years ago Hyperglycemia   [facility] at [place], [place]   2 years ago Thrombocytopenia [facility])   [facility] at [place], [place], [place]

## 2018-04-09 NOTE — Telephone Encounter (Signed)
Copied from [place] [id number]. Topic: Quick Communication - Rx Refill/Question >> Apr 09, 2018  1:39 PM Sheppard Coil, [employer] wrote: Medication:  lisinopril (PRINIVIL,ZESTRIL) 10 MG tablet memantine (NAMENDA) 10 MG tablet donepezil (ARICEPT) 10 MG tablet  Has the patient contacted their pharmacy? Yes - states that the pharmacy has requested this but hasn't gotten a response.  Pt is completely out of lisinopril and memantine.  Pt's wife states that if they can at least get the memantine now that would be helpful because this is the medication that keeps pt from being agitated. (Agent: If no, request that the patient contact the pharmacy for the refill.) (Agent: If yes, when and what did the pharmacy advise?)  Preferred Pharmacy (with phone number or street name): [place], [place] - [address] [phone] (Phone) [phone] (Fax)  Agent: Please be advised that RX refills may take up to 3 business days. We ask that you follow-up with your pharmacy.

## 2018-04-10 MED ORDER — DONEPEZIL HCL 10 MG PO TABS: 10.0000 mg | ORAL_TABLET | Freq: Every day | ORAL | 0 refills | Status: DC

## 2018-04-10 MED ORDER — LISINOPRIL 10 MG PO TABS: 10.0000 mg | ORAL_TABLET | Freq: Every day | ORAL | 0 refills | Status: DC

## 2018-04-10 MED ORDER — MEMANTINE HCL 10 MG PO TABS: 10.0000 mg | ORAL_TABLET | Freq: Two times a day (BID) | ORAL | 0 refills | Status: DC

## 2018-04-11 ENCOUNTER — Other Ambulatory Visit: Payer: Self-pay | Admitting: Family Medicine

## 2018-04-16 ENCOUNTER — Ambulatory Visit (INDEPENDENT_AMBULATORY_CARE_PROVIDER_SITE_OTHER): Payer: Medicare HMO

## 2018-04-16 DIAGNOSIS — Z95810 Presence of automatic (implantable) cardiac defibrillator: Secondary | ICD-10-CM | POA: Diagnosis not present

## 2018-04-16 DIAGNOSIS — I5022 Chronic systolic (congestive) heart failure: Secondary | ICD-10-CM

## 2018-04-16 NOTE — Progress Notes (Signed)
EPIC Encounter for ICM Monitoring  Patient Name: [name] is a 85 y.o. male Date: 04/16/2018 Primary Care Physican: [name], MD Primary Cardiologist:Cooper/Weaver, PA Electrophysiologist: Caryl Comes Last Weight:186lbs Today's Weight:unknown Right Ventricular:93% Left Ventricular:92%  AT/AF Overview Since Last Reset Feb 09, 2018  Atrial Arrhythmia  AT/AF 2 % (was <1% on 01/11/2018 report)  AT/AF Events 3   Total Time in AT/AF (days) 1.3   Most Recent (Feb 27, 2018 19:07)           Avg V Rate, Duration (hh:mm:ss) 76 bpm, 29:22:12   Longest (Feb 27, 2018 19:07)                   Avg V Rate, Duration (hh:mm:ss) 76 bpm, 29:22:12   Fastest RVS Rate (Feb 27, 2018 19:07)    Max V Rate, Duration (hh:mm:ss) 127 bpm, 29:22:12  Attempted call to patient/wife. Left message to return call.  Transmission reviewed.  HeartLogic Heart Failure Index is6 suggesting normal.  Prescribed:Furosemide 40 mg 1 tablet twice a day (Dr Olin Pia office note 02/09/2018 states will continue to give him extra Furosemide as needed extra). Potassium 20 mEq 1 tablet daily  Recommendations: Unable to reach.  Follow-up plan: ICM clinic phone appointment on4/20/2020.  Copy of ICM check sent to Dr. Caryl Comes.             Rosalene Billings, RN 04/16/2018 9:03 AM

## 2018-04-17 ENCOUNTER — Telehealth: Payer: Self-pay

## 2018-04-17 NOTE — Telephone Encounter (Signed)
Remote ICM transmission received.  Attempted call to patient regarding ICM remote transmission and left message to return call   

## 2018-05-10 ENCOUNTER — Other Ambulatory Visit: Payer: Self-pay

## 2018-05-10 ENCOUNTER — Ambulatory Visit: Payer: Medicare HMO | Admitting: Family Medicine

## 2018-05-15 ENCOUNTER — Other Ambulatory Visit: Payer: Self-pay

## 2018-05-15 ENCOUNTER — Ambulatory Visit (INDEPENDENT_AMBULATORY_CARE_PROVIDER_SITE_OTHER): Payer: Medicare HMO | Admitting: Family Medicine

## 2018-05-15 DIAGNOSIS — I10 Essential (primary) hypertension: Secondary | ICD-10-CM

## 2018-05-15 DIAGNOSIS — Z5181 Encounter for therapeutic drug level monitoring: Secondary | ICD-10-CM | POA: Diagnosis not present

## 2018-05-15 DIAGNOSIS — R739 Hyperglycemia, unspecified: Secondary | ICD-10-CM

## 2018-05-15 DIAGNOSIS — R4189 Other symptoms and signs involving cognitive functions and awareness: Secondary | ICD-10-CM | POA: Diagnosis not present

## 2018-05-15 MED ORDER — AZELASTINE HCL 0.1 % NA SOLN: 2.0000 | Freq: Two times a day (BID) | NASAL | 1 refills | Status: DC | PRN

## 2018-05-16 NOTE — Assessment & Plan Note (Signed)
They are following with cardiology for INR monitoring and has an appt soon to have INR check, he is not having any bleeding or concerning symptoms

## 2018-05-16 NOTE — Assessment & Plan Note (Signed)
He is quarantining at home with his wife and she notes his memory varies day to day but he is managing his ADLs with her help well.

## 2018-05-16 NOTE — Assessment & Plan Note (Signed)
They are encouraged to monitor blood pressure as able and let us know if they have any concerns.

## 2018-05-16 NOTE — Progress Notes (Signed)
Virtual Visit via Video Note  I connected with [name] on 05/16/18 at 10:20 AM EDT by a video enabled telemedicine application and verified that I am speaking with the correct person using two identifiers.   I discussed the limitations of evaluation and management by telemedicine and the availability of in person appointments. The patient expressed understanding and agreed to proceed. Magdalene Molly, CMA was able to get patient set up on Video platform.     Subjective:    Patient ID: [name], male    DOB: Oct 08, 1931, 85 y.o.   MRN: [id number]  No chief complaint on file.   HPI Patient is in today for follow up on chronic medical concerns and he is interviewed in video platform with his wife in attendance. He reports he feels well and he has no acute concerns. No febrile illness or recent hospitalizations. He continues to follow closely with cardiology and they are monitoring his INR. He has an appt soon for the next check. No bleeding or concerning symptoms. His wife confirms his memory is fluctuating day to day and he requires his wife's assistance for ADLs but they are getting by day to day. Denies CP/palp/SOB/HA/congestion/fevers/GI or GU c/o. Taking meds as prescribed  Past Medical History:  Diagnosis Date  . AAA (abdominal aortic aneurysm) ([place])   . Abdominal pain 04/14/2011  . Acute bronchitis 09/02/2013  . Acute vascular insufficiency of intestine (HCC)    Mesenteric embolus...surgery 2003  . Allergic rhinitis 09/09/2013  . Anxiety   . Aortic atherosclerosis ([place]) 11/10/2015  . Atrial fibrillation (HCC)    paroxysmal  . Back pain 08/08/2016  . Biventricular implantable cardiac defibrillator in situ    GDT CONTAK H170-MADIT-CRT-EXPLANTED 2011 implanted defibrillator- Guidant cognis, model n119-2001 Dr. Caryl Comes (11/28/2009)  . Breast tenderness    Spironolactone was stopped and eplerinone started. Patient feels better  . CAD (coronary artery disease)    CABG 1994  /    catheterization 1996, occluded vein graft to the diagonal, other grafts patent  /   catheterization 2006, no PCI  /  nuclear, October, 2011, extensive scar anteroseptal and apical, no ischemia  . Carotid artery disease ([place])    Doppler, February,  2012, 0-39% bilateral, recommended followup 1 year  . Chronic systolic heart failure (HCC)    chronic systolic   . Chronotropic incompetence   . Cirrhosis, nonalcoholic ([place]) 0/53/9767  . Colonic polyp 2010   pathology not clear.   . Crohn's disease ([place])   . Degenerative joint disease   . Dermatitis 04/05/2016  . Dizziness    Positional dizziness  . Edema leg    Venous Dopplers 8/13: No DVT bilaterally  . Ejection fraction    EF 35%, echo, November, 2011  //   Is 35-40%, echo,  September 23, 2011   . Fatigue 06/25/2008   Qualifier: Diagnosis of  By: [name], MD, Leonidas Romberg Dorinda Hill   . GERD 02/21/2007   Qualifier: Diagnosis of  By: Ronnald Ramp CNA/MA, Janett Billow    . Hyperglycemia 08/08/2016  . Hyperlipemia   . Hypertension   . Hypertrophy of prostate with urinary obstruction and other lower urinary tract symptoms (LUTS)   . Hypoalbuminemia 06/16/2013  . Hypokalemia 06/18/2013  . Implantable cardioverter-defibrillator (ICD) in situ 12/09/2009   Annotation: EXPLANTED 2011   //   New Guidant ICD implanted 2011     . Iron deficiency anemia   . Ischemic cardiomyopathy    CABG 1994 CAD catherization 1996.[name] occluded vein graft  to the diagnol, other grafts patent/  catherization 2006, no PCI/ nuclear.[name] october 2011.[name] extensive scar anteroseptal and apical.. no ischemia    . LBBB (left bundle branch block)   . MCI (mild cognitive impairment) 02/27/2017  . 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 Mitral regurgitation    mild.[name] echo november 2011  EF 35%...echo.. november 2009/ ef 35%.[name] echo november 2011  . Oral thrush 02/05/2015  . OSA (obstructive sleep apnea) 09/16/2011   NPSG 2013:  AHI 34/hr.    . Polymyalgia rheumatica ([place])   . Shingles   . Sinus bradycardia    . Skin cancer 08/08/2016  . Thrombocytopenia ([place]) 2008  . Thrush   . Tremor   . Ventral hernia    multiple, wears an abd binder, reports he hs been told he was at increased riisk for surgery  . Vitamin B12 deficiency   . Vitamin D deficiency 11/06/2008   Qualifier: Diagnosis of  By: Lenna Gilford MD, Deborra Medina   . Warfarin anticoagulation    Coumadin therapy for mesenteric embolus 2003    Past Surgical History:  Procedure Laterality Date  . BIV ICD GENERATOR CHANGEOUT N/A 10/23/2017   Procedure: BIV ICD GENERATOR CHANGEOUT;  Surgeon: Deboraha Sprang, MD;  Location: [facility] LAB;  Service: Cardiovascular;  Laterality: N/A;  . CATARACT EXTRACTION, BILATERAL  2014  . COLONOSCOPY N/A 04/19/2013   Procedure: COLONOSCOPY;  Surgeon: Jerene Bears, MD;  Location: [facility] ENDOSCOPY;  Service: Endoscopy;  Laterality: N/A;  . CORONARY ARTERY BYPASS GRAFT  1994  . Elap w/ superior mesenteric artery embolectomy  1/03   by Dr. Jennette Banker  . ESOPHAGOGASTRODUODENOSCOPY N/A 04/18/2013   Procedure: ESOPHAGOGASTRODUODENOSCOPY (EGD);  Surgeon: Jerene Bears, MD;  Location: [facility] ENDOSCOPY;  Service: Endoscopy;  Laterality: N/A;  . implantation of biventric cardioverter-defibrillatorr     by Dr. Lovena Le in 8/06 Guidant Contak H170-explanted 2011  . implantation of guidant cognis device     model n119-2011  . laparoscopic cholecystectomy  2002  . left inguinal hernia repair with mesh  6/08   by Dr. Betsy Pries  . PROSTATE SURGERY  08/2012   by urology in [place]  . Repair of incarcerated ventral hernia and lysis of adhesions  11/03   by Dr. Betsy Pries    Family History  Problem Relation Age of Onset  . Heart disease Mother   . Tremor Sister   . Heart failure Sister   . Heart disease Father 66  . Hypertension Father   . Appendicitis Brother        ruptured  . Diabetes Son   . Heart disease Sister   . Hypertension Sister   . Dementia Sister   . Heart disease Sister   . Hypertension Brother   . Kidney disease  Brother   . Myasthenia gravis Brother   . Cirrhosis Brother   . Leukemia Brother   . Hypertension Other   . Hyperlipidemia Other   . Heart disease Sister   . Edema Sister        pedal edema    Social History   Socioeconomic History  . Marital status: Married    Spouse name: Kennyth Lose  . Number of children: 3  . Years of education: Not on file  . Highest education level: Not on file  Occupational History  . Occupation: bull Actuary  . Occupation: farmer    Fish farm manager: RETIRED  . Occupation: logger  Social Needs  . Financial resource strain: Not on file  .  Food insecurity:    Worry: Not on file    Inability: Not on file  . Transportation needs:    Medical: Not on file    Non-medical: Not on file  Tobacco Use  . Smoking status: Former Smoker    Packs/day: 1.00    Years: 20.00    Pack years: 20.00    Types: Cigarettes    Last attempt to quit: 01/31/1973    Years since quitting: 45.3  . Smokeless tobacco: Never Used  Substance and Sexual Activity  . Alcohol use: Yes    Alcohol/week: 0.0 standard drinks    Comment: occasional  . Drug use: No  . Sexual activity: Never    Comment: lives with wife, no dietary restrictions  Lifestyle  . Physical activity:    Days per week: Not on file    Minutes per session: Not on file  . Stress: Not on file  Relationships  . Social connections:    Talks on phone: Not on file    Gets together: Not on file    Attends religious service: Not on file    Active member of club or organization: Not on file    Attends meetings of clubs or organizations: Not on file    Relationship status: Not on file  . Intimate partner violence:    Fear of current or ex partner: Not on file    Emotionally abused: Not on file    Physically abused: Not on file    Forced sexual activity: Not on file  Other Topics Concern  . Not on file  Social History Narrative   Lives in [place], [place] with wife.     Outpatient Medications Prior to Visit  Medication  Sig Dispense Refill  . Ascorbic Acid (VITAMIN C) 100 MG tablet Take 100 mg by mouth daily.    . carvedilol (COREG) 6.25 MG tablet TAKE 1 TABLET BY MOUTH TWICE DAILY WITH MEALS 180 tablet 3  . cholecalciferol (VITAMIN D) 1000 UNITS tablet Take 1,000 Units by mouth daily.     [name] donepezil (ARICEPT) 10 MG tablet Take 1 tablet (10 mg total) by mouth at bedtime. 30 tablet 0  . ferrous sulfate 325 (65 FE) MG tablet Take 325 mg by mouth daily with breakfast.    . furosemide (LASIX) 40 MG tablet Take 1 tablet (40 mg total) by mouth 2 (two) times daily. 180 tablet 3  . hydrocortisone cream 1 % Apply 1 application topically daily as needed for itching.    [name] lisinopril (PRINIVIL,ZESTRIL) 10 MG tablet Take 1 tablet (10 mg total) by mouth daily. 30 tablet 0  . memantine (NAMENDA) 10 MG tablet TAKE 1 TABLET BY MOUTH TWICE DAILY. NEED OFFICE VISIT FOR FURTHER REFILLS. 14 tablet 0  . oxymetazoline (AFRIN) 0.05 % nasal spray Place 1 spray into both nostrils daily as needed for congestion.    . pantoprazole (PROTONIX) 40 MG tablet Take 1 tablet (40 mg total) by mouth daily. TAKE 1 TABLET BY MOUTH ONCE DAILY AT  12  NOON 90 tablet 2  . potassium chloride SA (K-DUR,KLOR-CON) 20 MEQ tablet TAKE 1 TABLET BY MOUTH ONCE DAILY 90 tablet 3  . simvastatin (ZOCOR) 20 MG tablet TAKE 1 TABLET BY MOUTH AT BEDTIME 90 tablet 0  . vitamin B-12 (CYANOCOBALAMIN) 1000 MCG tablet Take 1,000 mcg by mouth daily.     [name] warfarin (COUMADIN) 5 MG tablet Take as directed by the Coumadin Clinic. 100 tablet 2   No facility-administered medications prior  to visit.     No Known Allergies  Review of Systems  Constitutional: Positive for malaise/fatigue. Negative for fever.  HENT: Negative for congestion.   Eyes: Negative for blurred vision.  Respiratory: Negative for shortness of breath.   Cardiovascular: Negative for chest pain, palpitations and leg swelling.  Gastrointestinal: Negative for abdominal pain, blood in stool and nausea.   Genitourinary: Negative for dysuria and frequency.  Musculoskeletal: Negative for falls.  Skin: Negative for rash.  Neurological: Negative for dizziness, loss of consciousness and headaches.  Endo/Heme/Allergies: Negative for environmental allergies.  Psychiatric/Behavioral: Positive for memory loss. Negative for depression. The patient is not nervous/anxious.        Objective:    Physical Exam Constitutional:      Appearance: Normal appearance.  HENT:     Head: Normocephalic and atraumatic.     Nose: Nose normal.  Pulmonary:     Effort: Pulmonary effort is normal.  Neurological:     Mental Status: He is alert.  Psychiatric:        Mood and Affect: Mood normal.     There were no vitals taken for this visit. Wt Readings from Last 3 Encounters:  04/03/18 185 lb (83.9 kg)  03/09/18 191 lb (86.6 kg)  02/09/18 189 lb (85.7 kg)    Diabetic Foot Exam - Simple   No data filed     Lab Results  Component Value Date   WBC 7.3 03/09/2018   HGB 13.7 03/09/2018   HCT 40.4 03/09/2018   PLT 57.0 (L) 03/09/2018   GLUCOSE 81 03/09/2018   CHOL 131 08/08/2016   TRIG 106.0 08/08/2016   HDL 52.10 08/08/2016   LDLDIRECT 81.6 12/30/2013   LDLCALC 58 08/08/2016   ALT 12 03/09/2018   AST 24 03/09/2018   NA 144 03/09/2018   K 4.2 03/09/2018   CL 106 03/09/2018   CREATININE 1.04 03/09/2018   BUN 8 03/09/2018   CO2 32 03/09/2018   TSH 2.68 02/27/2017   PSA 2.93 11/24/2010   INR 3.1 (A) 03/23/2018   HGBA1C 5.6 08/08/2016    Lab Results  Component Value Date   TSH 2.68 02/27/2017   Lab Results  Component Value Date   WBC 7.3 03/09/2018   HGB 13.7 03/09/2018   HCT 40.4 03/09/2018   MCV 90.7 03/09/2018   PLT 57.0 (L) 03/09/2018   Lab Results  Component Value Date   NA 144 03/09/2018   K 4.2 03/09/2018   CO2 32 03/09/2018   GLUCOSE 81 03/09/2018   BUN 8 03/09/2018   CREATININE 1.04 03/09/2018   BILITOT 2.3 (H) 03/09/2018   ALKPHOS 85 03/09/2018   AST 24 03/09/2018    ALT 12 03/09/2018   PROT 5.4 (L) 03/09/2018   ALBUMIN 3.6 03/09/2018   CALCIUM 9.0 03/09/2018   ANIONGAP 7 09/03/2016   GFR 67.91 03/09/2018   Lab Results  Component Value Date   CHOL 131 08/08/2016   Lab Results  Component Value Date   HDL 52.10 08/08/2016   Lab Results  Component Value Date   LDLCALC 58 08/08/2016   Lab Results  Component Value Date   TRIG 106.0 08/08/2016   Lab Results  Component Value Date   CHOLHDL 3 08/08/2016   Lab Results  Component Value Date   HGBA1C 5.6 08/08/2016       Assessment & Plan:   Problem List Items Addressed This Visit    Hypertension    They are encouraged to monitor blood pressure as  able and let us know if they have any concerns.       Hyperglycemia    hgba1c acceptable, minimize simple carbs. Increase exercise as tolerated.       Cognitive impairment    He is quarantining at home with his wife and she notes his memory varies day to day but he is managing his ADLs with her help well.      Encounter for therapeutic drug monitoring    They are following with cardiology for INR monitoring and has an appt soon to have INR check, he is not having any bleeding or concerning symptoms         I am having [name] start on azelastine. I am also having him maintain his cholecalciferol, vitamin B-12, carvedilol, oxymetazoline, hydrocortisone cream, pantoprazole, potassium chloride SA, simvastatin, ferrous sulfate, vitamin C, warfarin, furosemide, donepezil, lisinopril, and memantine.  Meds ordered this encounter  Medications  . azelastine (ASTELIN) 0.1 % nasal spray    Sig: Place 2 sprays into both nostrils 2 (two) times daily as needed for rhinitis or allergies. Use in each nostril as directed    Dispense:  30 mL    Refill:  1     I discussed the assessment and treatment plan with the patient. The patient was provided an opportunity to ask questions and all were answered. The patient agreed with the plan and  demonstrated an understanding of the instructions.   The patient was advised to call back or seek an in-person evaluation if the symptoms worsen or if the condition fails to improve as anticipated I provided non-face-to-face video visit during this encounter.   Penni Homans, MD

## 2018-05-16 NOTE — Assessment & Plan Note (Signed)
hgba1c acceptable, minimize simple carbs. Increase exercise as tolerated.  

## 2018-05-17 ENCOUNTER — Telehealth: Payer: Self-pay | Admitting: Family Medicine

## 2018-05-17 ENCOUNTER — Other Ambulatory Visit: Payer: Self-pay | Admitting: Pulmonary Disease

## 2018-05-17 NOTE — Telephone Encounter (Signed)
Called and LVM for patient to schedule a 2 month f/u (around 07/15/18) appt.

## 2018-05-21 ENCOUNTER — Ambulatory Visit (INDEPENDENT_AMBULATORY_CARE_PROVIDER_SITE_OTHER): Payer: Medicare HMO

## 2018-05-21 ENCOUNTER — Telehealth: Payer: Self-pay | Admitting: Family Medicine

## 2018-05-21 ENCOUNTER — Other Ambulatory Visit: Payer: Self-pay

## 2018-05-21 DIAGNOSIS — Z95810 Presence of automatic (implantable) cardiac defibrillator: Secondary | ICD-10-CM | POA: Diagnosis not present

## 2018-05-21 DIAGNOSIS — I5022 Chronic systolic (congestive) heart failure: Secondary | ICD-10-CM

## 2018-05-21 MED ORDER — DONEPEZIL HCL 10 MG PO TABS: 10.0000 mg | ORAL_TABLET | Freq: Every day | ORAL | 1 refills | Status: DC

## 2018-05-21 NOTE — Telephone Encounter (Signed)
Copied from [place] [phone]. Topic: Quick Communication - Rx Refill/Question >> May 21, 2018 12:01 PM [name] wrote: Medication: donepezil (ARICEPT) 10 MG tablet - Pt has been out of this medication for several days  Has the patient contacted their pharmacy? Yes - pharmacy said they havent heard anything back from office   Preferred Pharmacy (with phone number or street name): [place], [place] - [address] [phone] (Phone) [phone] (Fax)    Agent: Please be advised that RX refills may take up to 3 business days. We ask that you follow-up with your pharmacy.

## 2018-05-21 NOTE — Telephone Encounter (Signed)
Medication sent in. 

## 2018-05-22 ENCOUNTER — Telehealth: Payer: Self-pay

## 2018-05-22 ENCOUNTER — Telehealth: Payer: Self-pay | Admitting: *Deleted

## 2018-05-22 ENCOUNTER — Ambulatory Visit (INDEPENDENT_AMBULATORY_CARE_PROVIDER_SITE_OTHER): Payer: Medicare HMO | Admitting: *Deleted

## 2018-05-22 ENCOUNTER — Other Ambulatory Visit: Payer: Self-pay

## 2018-05-22 DIAGNOSIS — I255 Ischemic cardiomyopathy: Secondary | ICD-10-CM

## 2018-05-22 DIAGNOSIS — I5022 Chronic systolic (congestive) heart failure: Secondary | ICD-10-CM

## 2018-05-22 LAB — CUP PACEART REMOTE DEVICE CHECK
Battery Remaining Longevity: 114 mo
Battery Remaining Percentage: 100 %
Brady Statistic RA Percent Paced: 88 %
Brady Statistic RV Percent Paced: 94 %
Date Time Interrogation Session: 20200421052100
HighPow Impedance: 42 Ohm
Implantable Lead Implant Date: 20060808
Implantable Lead Implant Date: 20060808
Implantable Lead Implant Date: 20060911
Implantable Lead Location: 753858
Implantable Lead Location: 753859
Implantable Lead Location: 753860
Implantable Lead Model: 158
Implantable Lead Model: 4087
Implantable Lead Serial Number: 165392
Implantable Lead Serial Number: 261368
Implantable Pulse Generator Implant Date: 20190923
Lead Channel Impedance Value: 377 Ohm
Lead Channel Impedance Value: 405 Ohm
Lead Channel Impedance Value: 490 Ohm
Lead Channel Setting Pacing Amplitude: 2 V
Lead Channel Setting Pacing Amplitude: 2.3 V
Lead Channel Setting Pacing Amplitude: 2.4 V
Lead Channel Setting Pacing Pulse Width: 0.4 ms
Lead Channel Setting Pacing Pulse Width: 0.8 ms
Lead Channel Setting Sensing Sensitivity: 0.5 mV
Lead Channel Setting Sensing Sensitivity: 1 mV
Pulse Gen Serial Number: 134188

## 2018-05-22 NOTE — Telephone Encounter (Signed)
1. Do you currently have a fever? no (yes = cancel and refer to pcp for e-visit) 2. Have you recently travelled on a cruise, internationally, or to [place], [place], [place], [place], [place], or [place], [place] [employer]) ? no (yes = cancel, stay home, monitor symptoms, and contact pcp or initiate e-visit if symptoms develop) 3. Have you been in contact with someone that is currently pending confirmation of Covid19 testing or has been confirmed to have the [place] virus?  No (yes = cancel, stay home, away from tested individual, monitor symptoms, and contact pcp or initiate e-visit if symptoms develop) 4. Are you currently experiencing fatigue or cough? No (yes = pt should be prepared to have a mask placed at the time of their visit).  Pt. Advised that we are restricting visitors at this time and anyone present in the vehicle should meet the above criteria as well. Advised that visit will be at curbside for finger stick ONLY and will receive call with instructions. Pt also advised to please bring own pen for signature of arrival document.

## 2018-05-22 NOTE — Telephone Encounter (Signed)
lmom for prescreen  

## 2018-05-23 NOTE — Progress Notes (Signed)
EPIC Encounter for ICM Monitoring  Patient Name: [name] is a 85 y.o. male Date: 05/23/2018 Primary Care Physican: [name], MD Primary Cardiologist:Cooper/Weaver, PA Electrophysiologist: Caryl Comes Last Weight:186lbs 05/23/2018 Weight:189 lbs Right Ventricular:94% Left Ventricular:93%  AT/AF Overview Since Last Reset Feb 09, 2018  Atrial Arrhythmia  AT/AF 1 %  AT/AF Events 4   Total Time in AT/AF (days) 1.3   Most Recent (April 28, 2018 05:17) Avg V Rate, Duration (hh:mm:ss) 69 bpm, 00:00:12   Longest (Feb 27, 2018 19:07) Avg V Rate, Duration (hh:mm:ss) 76 bpm, 29:22:12   Fastest RVS Rate (Feb 27, 2018 19:07) Max V Rate, Duration (hh:mm:ss) 127 bpm, 29:22:12  Call to wife. Heart failure questions and pt asymptomatic.  HeartLogic Heart Failure Index is7 suggesting normal.  Prescribed:Furosemide 40 mg 1 tablet twice a day(Dr Klein's office note 02/09/2018 states willcontinue to give himextra Furosemideas needed extra).Potassium 20 mEq 1 tablet daily  Recommendations: Advised to call if he develops fluid accumulation.  Follow-up plan: ICM clinic phone appointment on6/01/2018.  3 Month Trend    [place], RN 05/23/2018 8:08 AM

## 2018-05-24 ENCOUNTER — Other Ambulatory Visit: Payer: Self-pay

## 2018-05-24 ENCOUNTER — Ambulatory Visit (INDEPENDENT_AMBULATORY_CARE_PROVIDER_SITE_OTHER): Payer: Medicare HMO

## 2018-05-24 DIAGNOSIS — Z0000 Encounter for general adult medical examination without abnormal findings: Secondary | ICD-10-CM

## 2018-05-24 DIAGNOSIS — K55069 Acute infarction of intestine, part and extent unspecified: Secondary | ICD-10-CM

## 2018-05-24 DIAGNOSIS — Z5181 Encounter for therapeutic drug level monitoring: Secondary | ICD-10-CM | POA: Diagnosis not present

## 2018-05-24 LAB — POCT INR: INR: 3.8 — AB (ref 2.0–3.0)

## 2018-05-25 NOTE — Patient Instructions (Signed)
Description   Called spoke with pt's wife on 05/25/18, advised to have pt skip today's dosage of Coumadin, then start taking 1 tablet daily except 1/2 tablet on Tuesdays, Thursdays and Saturdays.  Recheck INR in 4 weeks. Call our office if you have any concerns or are placed on any new medications [phone].  keep intake of greens consistent

## 2018-05-28 NOTE — Progress Notes (Signed)
Remote ICD transmission.   

## 2018-06-11 ENCOUNTER — Other Ambulatory Visit: Payer: Self-pay | Admitting: Family Medicine

## 2018-06-18 ENCOUNTER — Other Ambulatory Visit: Payer: Self-pay | Admitting: Family Medicine

## 2018-06-18 MED ORDER — MEMANTINE HCL 10 MG PO TABS: 10.0000 mg | ORAL_TABLET | Freq: Two times a day (BID) | ORAL | 2 refills | Status: DC

## 2018-06-18 NOTE — Telephone Encounter (Signed)
Rx sent 

## 2018-06-18 NOTE — Telephone Encounter (Signed)
Copied from [place] [id number]. Topic: Quick Communication - Rx Refill/Question >> Jun 18, 2018 12:04 PM Sheran Luz wrote: Medication: memantine (NAMENDA) 10 MG tablet  Patient's wife called to request a refill of this medication. She states she was advised by pharmacy that no refill could be sent w/o OV however she states patient was seen last month.    Preferred Pharmacy (with phone number or street name):[place], [place] - [address] [phone] (Phone) [phone] (Fax)

## 2018-06-21 ENCOUNTER — Telehealth: Payer: Self-pay

## 2018-06-21 NOTE — Telephone Encounter (Signed)
lmom for prescreen  

## 2018-06-22 ENCOUNTER — Other Ambulatory Visit: Payer: Self-pay

## 2018-06-22 ENCOUNTER — Ambulatory Visit (INDEPENDENT_AMBULATORY_CARE_PROVIDER_SITE_OTHER): Payer: Medicare HMO | Admitting: *Deleted

## 2018-06-22 DIAGNOSIS — Z0000 Encounter for general adult medical examination without abnormal findings: Secondary | ICD-10-CM | POA: Diagnosis not present

## 2018-06-22 DIAGNOSIS — Z5181 Encounter for therapeutic drug level monitoring: Secondary | ICD-10-CM

## 2018-06-22 DIAGNOSIS — K55069 Acute infarction of intestine, part and extent unspecified: Secondary | ICD-10-CM

## 2018-06-22 LAB — POCT INR: INR: 3.2 — AB (ref 2.0–3.0)

## 2018-06-22 NOTE — Patient Instructions (Addendum)
Description   Called spoke with pt's wife, advised to have pt skip today's dosage of Coumadin, then start taking 1/2 tablet daily and except for 1 tablet on Monday, Wednesday and Friday. Recheck INR in 3 weeks. Call our office if you have any concerns or are placed on any new medications [phone].  keep intake of greens consistent

## 2018-07-02 ENCOUNTER — Ambulatory Visit (INDEPENDENT_AMBULATORY_CARE_PROVIDER_SITE_OTHER): Payer: Medicare HMO

## 2018-07-02 DIAGNOSIS — I5022 Chronic systolic (congestive) heart failure: Secondary | ICD-10-CM

## 2018-07-02 DIAGNOSIS — Z95810 Presence of automatic (implantable) cardiac defibrillator: Secondary | ICD-10-CM

## 2018-07-02 NOTE — Progress Notes (Signed)
EPIC Encounter for ICM Monitoring  Patient Name: [name] is a 85 y.o. male Date: 07/02/2018 Primary Care Physican: [name], MD Primary Cardiologist:Cooper/Weaver, PA Electrophysiologist: Caryl Comes 05/23/2018 Weight:189 lbs Right Ventricular:95% Left Ventricular:94%  AT/AF Overview Since Last Reset Feb 09, 2018  Atrial Arrhythmia  AT/AF1%  AT/AF Events 18   Total Time in AT/AF (days) 1.4   Most Recent (Jun 12, 2018 04:19)    Avg V Rate, Duration (hh:mm:ss) 71 bpm, 00:01:05   Longest (Feb 27, 2018 19:07) Avg V Rate, Duration (hh:mm:ss) 76 bpm, 29:22:12   Fastest RVS Rate (Feb 27, 2018 19:07)  Max V Rate, Duration (hh:mm:ss) 127 bpm, 29:22:12  Transmission reviewed and results sent via mychart.  HeartLogic Heart Failure Index is11suggesting normal.  Prescribed:Furosemide 40 mg 1 tablet twice a day(Dr Klein's office note 02/09/2018 states willcontinue to give himextra Furosemideas needed extra).Potassium 20 mEq 1 tablet daily  Recommendations: Advised to call if he develops fluid accumulation.  Follow-up plan: ICM clinic phone appointment on7/07/2018.        Copy of ICM check sent to Dr. Caryl Comes.     [place], RN 07/02/2018 12:12 PM

## 2018-07-11 ENCOUNTER — Telehealth: Payer: Self-pay

## 2018-07-11 NOTE — Telephone Encounter (Signed)
lmom for prescreen  

## 2018-08-06 ENCOUNTER — Ambulatory Visit (INDEPENDENT_AMBULATORY_CARE_PROVIDER_SITE_OTHER): Payer: Medicare HMO

## 2018-08-06 DIAGNOSIS — Z95810 Presence of automatic (implantable) cardiac defibrillator: Secondary | ICD-10-CM

## 2018-08-06 DIAGNOSIS — I5022 Chronic systolic (congestive) heart failure: Secondary | ICD-10-CM | POA: Diagnosis not present

## 2018-08-07 NOTE — Progress Notes (Signed)
EPIC Encounter for ICM Monitoring  Patient Name: [name] is a 85 y.o. male Date: 08/07/2018 Primary Care Physican: [name], MD Primary Cardiologist:Cooper/Weaver, PA Electrophysiologist: Caryl Comes 7/7/2020Weight:185 lbs Right Ventricular:95% Left Ventricular:94%  AT/AF Overview Since Last Reset Feb 09, 2018  Atrial Arrhythmia  AT/AF<1%  AT/AF Events28  Total Time in AT/AF (days) 1.5   Most Recent (July 4, 202002:50)       Avg V Rate, Duration (hh:mm:ss) 7bpm, 00:00:07   Longest (Feb 27, 2018 19:07) Avg V Rate, Duration (hh:mm:ss) 76 bpm, 29:22:12   Fastest RVS Rate (Feb 27, 2018 19:07)  Max V Rate, Duration (hh:mm:ss)127 bpm,29:22:12  Spoke with wife. Heart failure questions reviewed.  He stated he is doing well.  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  HeartLogic Heart Failure Index is5suggesting normal threshold range.  Prescribed:Furosemide 40 mg 1 tablet twice a day(Dr Klein's office note 02/09/2018 states willcontinue to give himextra Furosemideas needed extra).Potassium 20 mEq 1 tablet daily  Recommendations: Advised to call if he develops fluid accumulation.  Follow-up plan: ICM clinic phone appointment on8/11/2018.     [place], RN 08/07/2018 9:44 AM

## 2018-08-09 ENCOUNTER — Other Ambulatory Visit: Payer: Self-pay

## 2018-08-09 ENCOUNTER — Ambulatory Visit: Payer: Medicare HMO | Admitting: *Deleted

## 2018-08-09 DIAGNOSIS — K55069 Acute infarction of intestine, part and extent unspecified: Secondary | ICD-10-CM | POA: Diagnosis not present

## 2018-08-09 LAB — POCT INR: INR: 2.3 (ref 2.0–3.0)

## 2018-08-09 NOTE — Patient Instructions (Signed)
Description   Continue taking 1/2 tablet daily and except for 1 tablet on Monday, Wednesday and Friday. Recheck INR in 4 weeks. Call our office if you have any concerns or are placed on any new medications [phone].  keep intake of greens consistent

## 2018-08-21 ENCOUNTER — Ambulatory Visit (INDEPENDENT_AMBULATORY_CARE_PROVIDER_SITE_OTHER): Payer: Medicare HMO | Admitting: *Deleted

## 2018-08-21 DIAGNOSIS — I255 Ischemic cardiomyopathy: Secondary | ICD-10-CM

## 2018-08-21 LAB — CUP PACEART REMOTE DEVICE CHECK
Battery Remaining Longevity: 108 mo
Battery Remaining Percentage: 100 %
Brady Statistic RA Percent Paced: 89 %
Brady Statistic RV Percent Paced: 95 %
Date Time Interrogation Session: 20200721052000
HighPow Impedance: 36 Ohm
Implantable Lead Implant Date: 20060808
Implantable Lead Implant Date: 20060808
Implantable Lead Implant Date: 20060911
Implantable Lead Location: 753858
Implantable Lead Location: 753859
Implantable Lead Location: 753860
Implantable Lead Model: 158
Implantable Lead Model: 4087
Implantable Lead Serial Number: 165392
Implantable Lead Serial Number: 261368
Implantable Pulse Generator Implant Date: 20190923
Lead Channel Impedance Value: 345 Ohm
Lead Channel Impedance Value: 408 Ohm
Lead Channel Impedance Value: 429 Ohm
Lead Channel Setting Pacing Amplitude: 2 V
Lead Channel Setting Pacing Amplitude: 2.3 V
Lead Channel Setting Pacing Amplitude: 2.4 V
Lead Channel Setting Pacing Pulse Width: 0.4 ms
Lead Channel Setting Pacing Pulse Width: 0.8 ms
Lead Channel Setting Sensing Sensitivity: 0.5 mV
Lead Channel Setting Sensing Sensitivity: 1 mV
Pulse Gen Serial Number: 134188

## 2018-08-23 DIAGNOSIS — C44311 Basal cell carcinoma of skin of nose: Secondary | ICD-10-CM | POA: Diagnosis not present

## 2018-08-23 DIAGNOSIS — Z85828 Personal history of other malignant neoplasm of skin: Secondary | ICD-10-CM | POA: Diagnosis not present

## 2018-08-23 DIAGNOSIS — L821 Other seborrheic keratosis: Secondary | ICD-10-CM | POA: Diagnosis not present

## 2018-08-23 DIAGNOSIS — D0461 Carcinoma in situ of skin of right upper limb, including shoulder: Secondary | ICD-10-CM | POA: Diagnosis not present

## 2018-08-23 DIAGNOSIS — L570 Actinic keratosis: Secondary | ICD-10-CM | POA: Diagnosis not present

## 2018-08-23 DIAGNOSIS — L298 Other pruritus: Secondary | ICD-10-CM | POA: Diagnosis not present

## 2018-08-24 ENCOUNTER — Other Ambulatory Visit: Payer: Self-pay | Admitting: Family Medicine

## 2018-09-02 ENCOUNTER — Other Ambulatory Visit: Payer: Self-pay | Admitting: Cardiology

## 2018-09-03 ENCOUNTER — Telehealth: Payer: Self-pay | Admitting: Nurse Practitioner

## 2018-09-03 NOTE — Telephone Encounter (Signed)
Pt's wife reported that pt has had fecal incontinence, diarrhea and weight loss.  Please advise.

## 2018-09-04 ENCOUNTER — Other Ambulatory Visit: Payer: Self-pay

## 2018-09-04 DIAGNOSIS — K5080 Crohn's disease of both small and large intestine without complications: Secondary | ICD-10-CM

## 2018-09-04 MED ORDER — CHOLESTYRAMINE 4 G PO PACK: 4.0000 g | PACK | Freq: Every day | ORAL | 3 refills | Status: DC

## 2018-09-04 NOTE — Telephone Encounter (Signed)
He has a history of Crohn's disease, I am suspicious that this is causing his issue Would recommend stool studies to include GI pathogen panel, also add fecal elastase and fecal leukocytes Would add cholestyramine 4 g packet once daily.  He needs to separate this by at least 2 hours on either side of any other medications as absorption of other medications can be impaired.  This medicine is expected to cause his bowels to become more formed. He needs office follow-up, me or next available APP

## 2018-09-04 NOTE — Telephone Encounter (Signed)
I have spoken with Ms Piascik. She reports the patient has "bouts of this" and she is worried because she notices him losing weight. He had multiple incontinent stools for the past week. She has "given him everything Dr Hilarie Fredrickson told me to but nothing helps." Reports Benefiber, Pepto Bismol, and Imodium over the past week. Today he has had only one bowel movement that she reports was liquid. He is refusing to eat. She is giving him Ensure.  Discussed trying clear liquids half the day and advancing to [employer]. She wants to continue Ensure. Discussed no dairy, high fructose corn syrup or artificial sweeteners. She will call back in 2 days with an up date of symptoms.  He saw you last. Please advise. Thanks

## 2018-09-04 NOTE — Telephone Encounter (Signed)
Orders in epic for lab, script sent to pharmacy. Pt already scheduled to see Nevin Bloodgood 8/12@10am . Left message to call back.

## 2018-09-04 NOTE — Telephone Encounter (Signed)
No answer. No voicemail. Patient does need to be seen and has an appointment. Will attempt to reach him again.

## 2018-09-05 ENCOUNTER — Encounter: Payer: Self-pay | Admitting: Cardiology

## 2018-09-05 NOTE — Progress Notes (Signed)
Remote ICD transmission.   

## 2018-09-06 NOTE — Telephone Encounter (Signed)
Left message to call back  

## 2018-09-10 ENCOUNTER — Other Ambulatory Visit: Payer: Self-pay | Admitting: Cardiology

## 2018-09-10 ENCOUNTER — Ambulatory Visit (INDEPENDENT_AMBULATORY_CARE_PROVIDER_SITE_OTHER): Payer: Medicare HMO

## 2018-09-10 DIAGNOSIS — I5022 Chronic systolic (congestive) heart failure: Secondary | ICD-10-CM

## 2018-09-10 DIAGNOSIS — Z95810 Presence of automatic (implantable) cardiac defibrillator: Secondary | ICD-10-CM

## 2018-09-10 NOTE — Telephone Encounter (Signed)
Unable to reach pt after leaving multiple messages. Pt has OV scheduled for this Wednesday.

## 2018-09-11 NOTE — Progress Notes (Signed)
EPIC Encounter for ICM Monitoring  Patient Name: [name] is a 85 y.o. male Date: 09/11/2018 Primary Care Physican: [name], MD Primary Cardiologist:Cooper/Weaver, PA Electrophysiologist: Caryl Comes 8/12/2020Weight:185 lbs Right Ventricular:95% Left Ventricular:94%  AT/AF Overview Since Last Reset Feb 09, 2018  Atrial Arrhythmia  AT/AF1%  AT/AF Events42  Total Time in AT/AF (days) 2.3  Most Recent (Aug 9, 202011:30)     Avg V Rate, Duration (hh:mm:ss) 83bpm, 00:00:07  Longest (Feb 27, 2018 19:07) Avg V Rate, Duration (hh:mm:ss) 76 bpm, 29:22:12   Fastest RVS Rate (Feb 27, 2018 19:07) Max V Rate, Duration (hh:mm:ss)127 bpm,29:22:12  Spoke with wife.  Heart failure questions reviewed.  Has had difficulty sleeping for past 2 days and suffered severe leg cramps.  He works outside daily tending to 7 gardens.  HeartLogic Heart Failure Index is0suggesting normal threshold range.  Thoracic impedance elevated possibly suggesting dryness.   Prescribed:Furosemide 40 mg 1 tablet twice a day(Dr Klein's office note 02/09/2018 states willcontinue to give himextra Furosemideas needed extra).Potassium 20 mEq 1 tablet daily  Labs: 03/09/2018 Creatinine 1.04, BUN 8, Potassium 4.2, Sodium 144, GFR 67.91  Recommendations: Wife is asking if leg cramps could be caused by dehydration even though he drinks a lot of water.  He does not take Magnesium.   Follow-up plan: ICM clinic phone appointment on10/21/2020 and will monitor for alerts.  OV with Dr Burt Knack 11/19/2018        Copy of ICM check sent to Dr. Caryl Comes and Richardson Dopp, PA for review regarding severe leg cramping.          Rosalene Billings, RN 09/11/2018 8:51 AM

## 2018-09-12 ENCOUNTER — Telehealth: Payer: Self-pay

## 2018-09-12 ENCOUNTER — Ambulatory Visit: Payer: Medicare HMO | Admitting: Nurse Practitioner

## 2018-09-12 NOTE — Telephone Encounter (Signed)
Attempted ICM call to wife and and mail box is full on cell phone and does not answer home phone.

## 2018-09-12 NOTE — Progress Notes (Signed)
Decrease Lasix to 40 mg once daily. Check BMET 1 week. Weigh daily.  Take extra Lasix 40 mg if weight increases > 3 lbs in 1 day or if he has increased leg swelling. Repeat ICM check in 2 weeks. Richardson Dopp, PA-C    09/12/2018 4:08 PM

## 2018-09-12 NOTE — Telephone Encounter (Signed)
Attempted cell and home phone number.  Left message on home phone to return call.

## 2018-09-12 NOTE — Progress Notes (Signed)
Attempted call to wife and left message on home number.  Left message to decrease Lasix to 40 mg daily and will need labs done next week. Advised I will be out of the office tomorrow and will call her back on Friday.

## 2018-09-12 NOTE — Progress Notes (Signed)
Attempted 2 calls to wife to provide [employer] instructions and no answer on cell or home number.  Next home remote scheduled 09/24/2018 to recheck fluid levels.

## 2018-09-14 MED ORDER — FUROSEMIDE 40 MG PO TABS: 40.0000 mg | ORAL_TABLET | Freq: Every day | ORAL | 3 refills | Status: DC

## 2018-09-14 NOTE — Progress Notes (Signed)
Spoke with wife.  Advised of [employer] instructions.  BMET will be drawn at [facility] office after Coumadin check on 09/18/2018.  She said patient is feeling better and has not had any further leg cramping. Recheck transmission on 09/24/2018.

## 2018-09-14 NOTE — Progress Notes (Signed)
Attempted call to wife and no answer.

## 2018-09-18 ENCOUNTER — Ambulatory Visit (INDEPENDENT_AMBULATORY_CARE_PROVIDER_SITE_OTHER): Payer: Medicare HMO | Admitting: *Deleted

## 2018-09-18 ENCOUNTER — Other Ambulatory Visit: Payer: Self-pay

## 2018-09-18 DIAGNOSIS — K55069 Acute infarction of intestine, part and extent unspecified: Secondary | ICD-10-CM

## 2018-09-18 DIAGNOSIS — Z0000 Encounter for general adult medical examination without abnormal findings: Secondary | ICD-10-CM

## 2018-09-18 DIAGNOSIS — Z95810 Presence of automatic (implantable) cardiac defibrillator: Secondary | ICD-10-CM

## 2018-09-18 DIAGNOSIS — Z5181 Encounter for therapeutic drug level monitoring: Secondary | ICD-10-CM

## 2018-09-18 DIAGNOSIS — I5022 Chronic systolic (congestive) heart failure: Secondary | ICD-10-CM

## 2018-09-18 DIAGNOSIS — I480 Paroxysmal atrial fibrillation: Secondary | ICD-10-CM

## 2018-09-18 LAB — POCT INR: INR: 1.9 — AB (ref 2.0–3.0)

## 2018-09-18 NOTE — Progress Notes (Signed)
This encounter was created in error - please disregard.

## 2018-09-18 NOTE — Patient Instructions (Signed)
Description   Today take 1 tablet, then Continue taking 1/2 tablet daily and except for 1 tablet on Monday, Wednesday and Friday. Recheck INR in 3 weeks. Call our office if you have any concerns or are placed on any new medications [phone].  keep intake of greens consistent

## 2018-09-19 LAB — BASIC METABOLIC PANEL
BUN/Creatinine Ratio: 9 — ABNORMAL LOW (ref 10–24)
BUN: 10 mg/dL (ref 8–27)
CO2: 27 mmol/L (ref 20–29)
Calcium: 9.5 mg/dL (ref 8.6–10.2)
Chloride: 104 mmol/L (ref 96–106)
Creatinine, Ser: 1.16 mg/dL (ref 0.76–1.27)
GFR calc Af Amer: 66 mL/min/{1.73_m2} (ref >59)
GFR calc non Af Amer: 57 mL/min/{1.73_m2} — ABNORMAL LOW (ref >59)
Glucose: 97 mg/dL (ref 65–99)
Potassium: 4.9 mmol/L (ref 3.5–5.2)
Sodium: 142 mmol/L (ref 134–144)

## 2018-09-24 ENCOUNTER — Ambulatory Visit (INDEPENDENT_AMBULATORY_CARE_PROVIDER_SITE_OTHER): Payer: Medicare HMO

## 2018-09-24 DIAGNOSIS — I5022 Chronic systolic (congestive) heart failure: Secondary | ICD-10-CM

## 2018-09-24 DIAGNOSIS — Z95810 Presence of automatic (implantable) cardiac defibrillator: Secondary | ICD-10-CM

## 2018-09-26 NOTE — Progress Notes (Signed)
EPIC Encounter for ICM Monitoring  Patient Name: [name] is a 85 y.o. male Date: 09/26/2018 Primary Care Physican: [name], MD Primary Cardiologist:Cooper/Weaver, PA Electrophysiologist: Caryl Comes 8/12/2020Weight:185lbs Right Ventricular:95% Left Ventricular:94%  AT/AF Overview Since Last Reset Feb 09, 2018  Atrial Arrhythmia  AT/AF1%  AT/AF Events44  Total Time in AT/AF (days) 2.5  Most Recent (Sep 21, 2018 04:48)  Avg V Rate, Duration (hh:mm:ss) 71 bpm, 04:45:21  Longest (Feb 27, 2018 19:07) Avg V Rate, Duration (hh:mm:ss) 76 bpm, 29:22:12   Fastest RVS Rate (Feb 27, 2018 19:07) Max V Rate, Duration (hh:mm:ss)127 bpm,29:22:12  Spoke with wife.  Heart failure questions reviewed. She reported he is now taking Magnesium and has helped with cramping.   HeartLogic Heart Failure Index is0suggesting normalthreshold range.  Thoracic impedance elevated possibly suggesting dryness.   Prescribed:Furosemide 40 mg 1 tablet twice a day(Dr Klein's office note 02/09/2018 states willcontinue to give himextra Furosemideas needed extra).Potassium 20 mEq 1 tablet daily  Labs: 09/18/2018 Creatinine 1.16, BUN 10, Potassium 4.9, Sodium 142, GFR 57-66 03/09/2018 Creatinine 1.04, BUN 8,   Potassium 4.2, Sodium 144, GFR 67.91  Recommendations: No changes and encouraged to call if experiencing any fluid symptoms.   Follow-up plan: ICM clinic phone appointment on10/14/2020 and will monitor for alerts.  OV with Dr Burt Knack 11/19/2018        Copy of ICM check sent to Dr. Caryl Comes     [place], RN 09/26/2018 8:52 AM

## 2018-10-05 ENCOUNTER — Other Ambulatory Visit: Payer: Self-pay | Admitting: Family Medicine

## 2018-10-09 ENCOUNTER — Ambulatory Visit (INDEPENDENT_AMBULATORY_CARE_PROVIDER_SITE_OTHER): Payer: Medicare HMO | Admitting: *Deleted

## 2018-10-09 ENCOUNTER — Other Ambulatory Visit: Payer: Self-pay

## 2018-10-09 DIAGNOSIS — K55069 Acute infarction of intestine, part and extent unspecified: Secondary | ICD-10-CM

## 2018-10-09 DIAGNOSIS — Z5181 Encounter for therapeutic drug level monitoring: Secondary | ICD-10-CM | POA: Diagnosis not present

## 2018-10-09 DIAGNOSIS — Z0000 Encounter for general adult medical examination without abnormal findings: Secondary | ICD-10-CM

## 2018-10-09 LAB — POCT INR: INR: 1.9 — AB (ref 2.0–3.0)

## 2018-10-09 NOTE — Patient Instructions (Signed)
Description   Today take 1 tablet, then change your dose to 1 tablet daily and except for 1/2 on Tuesdays, Thursdays and Saturdays. Recheck in 2 weeks.  Call us with any medication changes or concerns # [phone].  keep intake of greens consistent

## 2018-10-12 ENCOUNTER — Other Ambulatory Visit: Payer: Self-pay

## 2018-10-12 ENCOUNTER — Telehealth: Payer: Self-pay

## 2018-10-12 DIAGNOSIS — K7460 Unspecified cirrhosis of liver: Secondary | ICD-10-CM

## 2018-10-12 NOTE — Telephone Encounter (Signed)
Left number for wife to call and reschedule the [place] to a date and time that works better for them.

## 2018-10-12 NOTE — Telephone Encounter (Signed)
Pt returned your call.  

## 2018-10-12 NOTE — Telephone Encounter (Signed)
Pt scheduled for [place] of abd at [facility] 10/17/18@8am , pt to arrive there at 7:45am and be NPO after midnight. Pt aware of appt.

## 2018-10-12 NOTE — Telephone Encounter (Signed)
Wife given the phone number [phone] to call and reschedule the appt to a time that works better for pt.

## 2018-10-12 NOTE — Telephone Encounter (Signed)
Pt's wife wants to know if his appt can be rescheduled to a later morning appt.

## 2018-10-12 NOTE — Telephone Encounter (Signed)
-----   Message from Algernon Huxley, RN sent at 04/05/2018  1:00 PM EST ----- Regarding: [place] Repeat US in 6 mth-HCC screening

## 2018-10-17 ENCOUNTER — Ambulatory Visit (HOSPITAL_COMMUNITY): Payer: Medicare HMO

## 2018-10-17 ENCOUNTER — Ambulatory Visit (HOSPITAL_COMMUNITY)
Admission: RE | Admit: 2018-10-17 | Discharge: 2018-10-17 | Disposition: A | Discharge status: Home / Self Care | Payer: Medicare HMO | Source: Ambulatory Visit | Attending: Internal Medicine | Admitting: Internal Medicine

## 2018-10-17 DIAGNOSIS — K7460 Unspecified cirrhosis of liver: Secondary | ICD-10-CM | POA: Diagnosis not present

## 2018-10-17 DIAGNOSIS — Z1289 Encounter for screening for malignant neoplasm of other sites: Secondary | ICD-10-CM | POA: Diagnosis not present

## 2018-10-17 DIAGNOSIS — D6959 Other secondary thrombocytopenia: Secondary | ICD-10-CM | POA: Diagnosis not present

## 2018-10-21 ENCOUNTER — Other Ambulatory Visit: Payer: Self-pay | Admitting: Cardiology

## 2018-10-22 ENCOUNTER — Telehealth: Payer: Self-pay | Admitting: Internal Medicine

## 2018-10-22 NOTE — Telephone Encounter (Signed)
Pt aware, see result note.

## 2018-10-22 NOTE — Telephone Encounter (Signed)
Pt calling about imaging results.

## 2018-10-28 ENCOUNTER — Other Ambulatory Visit: Payer: Self-pay | Admitting: Family Medicine

## 2018-10-31 ENCOUNTER — Other Ambulatory Visit: Payer: Self-pay

## 2018-10-31 ENCOUNTER — Ambulatory Visit (INDEPENDENT_AMBULATORY_CARE_PROVIDER_SITE_OTHER): Payer: Medicare HMO

## 2018-10-31 DIAGNOSIS — Z5181 Encounter for therapeutic drug level monitoring: Secondary | ICD-10-CM | POA: Diagnosis not present

## 2018-10-31 DIAGNOSIS — Z0000 Encounter for general adult medical examination without abnormal findings: Secondary | ICD-10-CM

## 2018-10-31 DIAGNOSIS — K55069 Acute infarction of intestine, part and extent unspecified: Secondary | ICD-10-CM | POA: Diagnosis not present

## 2018-10-31 LAB — POCT INR: INR: 2 (ref 2.0–3.0)

## 2018-10-31 NOTE — Patient Instructions (Signed)
Description   Continue on same dosage 1 tablet daily and except for 1/2 tablet on Tuesdays, Thursdays and Saturdays. Recheck in 4 weeks.  Call us with any medication changes or concerns # [phone].  keep intake of greens consistent and drink only 2 Ensures per week.

## 2018-11-14 ENCOUNTER — Ambulatory Visit (INDEPENDENT_AMBULATORY_CARE_PROVIDER_SITE_OTHER): Payer: Medicare HMO

## 2018-11-14 DIAGNOSIS — Z95810 Presence of automatic (implantable) cardiac defibrillator: Secondary | ICD-10-CM | POA: Diagnosis not present

## 2018-11-14 DIAGNOSIS — I5022 Chronic systolic (congestive) heart failure: Secondary | ICD-10-CM

## 2018-11-16 NOTE — Progress Notes (Signed)
EPIC Encounter for ICM Monitoring  Patient Name: [name] is a 85 y.o. male Date: 11/16/2018 Primary Care Physican: [name], MD Primary Cardiologist:Cooper/Weaver, PA Electrophysiologist: Caryl Comes 8/12/2020Weight:185lbs Right Ventricular:94% Left Ventricular:93%  AT/AF Overview Since Last Reset Feb 09, 2018   AT/AF2%  AT/AF Events110  Total Time in AT/AF (days)4.4  Most Recent (Nov 11, 2018 01:11)  Avg V Rate, Duration (hh:mm:ss)72 bpm, 02:57:21  Longest (Feb 27, 2018 19:07) Avg V Rate, Duration (hh:mm:ss) 76 bpm, 29:22:12   Fastest RVS Rate (Oct 12, 2018 09:22) Max V Rate, Duration (hh:mm:ss)128 bpm, 09:08:50  Transmission reviewed   11/12/2018 HeartLogic Heart Failure Index is1suggesting normalthreshold range.  Prescribed:Furosemide 40 mg Take 1 tablet (40 mg total) by mouth daily. Take extra Lasix 40 mg if weight increases > 3 lbs in 1 day or if he has increased leg swelling.Potassium 20 mEq 1 tablet daily  Labs: 09/18/2018 Creatinine 1.16, BUN 10, Potassium 4.9, Sodium 142, GFR 57-66 03/09/2018 Creatinine 1.04, BUN 8,   Potassium 4.2, Sodium 144, GFR 67.91  Recommendations: None  Follow-up plan: ICM clinic phone appointment on 12/17/2018.   91 day device clinic remote transmission 11/20/2018.  Office appt 11/19/2018 with Dr. Burt Knack.          Copy of ICM check sent to Dr. Caryl Comes.       [place], RN 11/16/2018 10:59 AM

## 2018-11-19 ENCOUNTER — Ambulatory Visit: Payer: Medicare HMO | Admitting: Cardiovascular Disease

## 2018-11-20 ENCOUNTER — Ambulatory Visit (INDEPENDENT_AMBULATORY_CARE_PROVIDER_SITE_OTHER): Payer: Medicare HMO | Admitting: *Deleted

## 2018-11-20 DIAGNOSIS — I255 Ischemic cardiomyopathy: Secondary | ICD-10-CM

## 2018-11-20 DIAGNOSIS — I480 Paroxysmal atrial fibrillation: Secondary | ICD-10-CM

## 2018-11-20 LAB — CUP PACEART REMOTE DEVICE CHECK
Battery Remaining Longevity: 108 mo
Battery Remaining Percentage: 100 %
Brady Statistic RA Percent Paced: 87 %
Brady Statistic RV Percent Paced: 94 %
Date Time Interrogation Session: 20201020052100
HighPow Impedance: 38 Ohm
Implantable Lead Implant Date: 20060808
Implantable Lead Implant Date: 20060808
Implantable Lead Implant Date: 20060911
Implantable Lead Location: 753858
Implantable Lead Location: 753859
Implantable Lead Location: 753860
Implantable Lead Model: 158
Implantable Lead Model: 4087
Implantable Lead Serial Number: 165392
Implantable Lead Serial Number: 261368
Implantable Pulse Generator Implant Date: 20190923
Lead Channel Impedance Value: 367 Ohm
Lead Channel Impedance Value: 414 Ohm
Lead Channel Impedance Value: 453 Ohm
Lead Channel Setting Pacing Amplitude: 2 V
Lead Channel Setting Pacing Amplitude: 2.3 V
Lead Channel Setting Pacing Amplitude: 2.4 V
Lead Channel Setting Pacing Pulse Width: 0.4 ms
Lead Channel Setting Pacing Pulse Width: 0.8 ms
Lead Channel Setting Sensing Sensitivity: 0.5 mV
Lead Channel Setting Sensing Sensitivity: 1 mV
Pulse Gen Serial Number: 134188

## 2018-11-21 ENCOUNTER — Other Ambulatory Visit: Payer: Self-pay | Admitting: Family Medicine

## 2018-11-21 NOTE — Telephone Encounter (Signed)
Pt's spouse called in to make provider aware that pt is completely out of his medication. Spouse says that she just noticed that pt was out and apologize for error. She would like to know if provider could send in as soon as possible.

## 2018-11-21 NOTE — Telephone Encounter (Signed)
Requested medication (s) are due for refill today: yes  Requested medication (s) are on the active medication list: yes  Last refill:  08/21/18  Future visit scheduled: no  Notes to clinic:  Patient is completely out of medication   Requested Prescriptions  Pending Prescriptions Disp Refills   donepezil (ARICEPT) 10 MG tablet [Pharmacy Med Name: Donepezil HCl 10 MG Oral Tablet] 90 tablet 0    Sig: TAKE 1 TABLET BY MOUTH AT BEDTIME     Neurology:  Alzheimer's Agents Failed - 11/21/2018  9:23 AM      Failed - Valid encounter within last 6 months    Recent Outpatient Visits          6 months ago Essential hypertension   [facility] [place], [place]   1 year ago Acute bacterial bronchitis   [facility], [place], [place]   1 year ago Preventative health care   [facility] [place], [place]   2 years ago Essential hypertension   [facility] [place], [place]   2 years ago Hyperglycemia   [facility] [place], [place]      Future Appointments            In 3 months Sherren Mocha, MD [facility]

## 2018-11-22 NOTE — Telephone Encounter (Signed)
Medications sent in patient made aware

## 2018-11-26 ENCOUNTER — Ambulatory Visit (INDEPENDENT_AMBULATORY_CARE_PROVIDER_SITE_OTHER): Payer: Medicare HMO

## 2018-11-26 ENCOUNTER — Other Ambulatory Visit: Payer: Self-pay

## 2018-11-26 DIAGNOSIS — K55069 Acute infarction of intestine, part and extent unspecified: Secondary | ICD-10-CM | POA: Diagnosis not present

## 2018-11-26 DIAGNOSIS — Z0000 Encounter for general adult medical examination without abnormal findings: Secondary | ICD-10-CM | POA: Diagnosis not present

## 2018-11-26 DIAGNOSIS — Z5181 Encounter for therapeutic drug level monitoring: Secondary | ICD-10-CM

## 2018-11-26 LAB — POCT INR: INR: 1.9 — AB (ref 2.0–3.0)

## 2018-11-26 NOTE — Patient Instructions (Signed)
Description   Take 1.5 tablets today, then resume same dosage 1 tablet daily and except for 1/2 tablet on Tuesdays, Thursdays and Saturdays. Recheck in 4 weeks.  Call us with any medication changes or concerns # [phone].  keep intake of greens consistent and drink only 2 Ensures per week.

## 2018-11-29 ENCOUNTER — Telehealth: Payer: Self-pay | Admitting: Vascular Surgery

## 2018-11-29 NOTE — Telephone Encounter (Signed)
Called pt 3x to schedule CT scan. Will send letter.

## 2018-12-04 ENCOUNTER — Encounter: Payer: Self-pay | Admitting: Vascular Surgery

## 2018-12-04 ENCOUNTER — Other Ambulatory Visit: Payer: Self-pay | Admitting: Family Medicine

## 2018-12-04 NOTE — Telephone Encounter (Signed)
Pt wife is calling and would like 90 day supply of carvedilol

## 2018-12-05 NOTE — Progress Notes (Signed)
Remote ICD transmission.   

## 2018-12-10 ENCOUNTER — Other Ambulatory Visit: Payer: Self-pay | Admitting: Family Medicine

## 2018-12-12 ENCOUNTER — Encounter: Payer: Self-pay | Admitting: Internal Medicine

## 2018-12-14 ENCOUNTER — Ambulatory Visit (INDEPENDENT_AMBULATORY_CARE_PROVIDER_SITE_OTHER): Payer: Medicare HMO | Admitting: Family Medicine

## 2018-12-14 ENCOUNTER — Other Ambulatory Visit: Payer: Self-pay

## 2018-12-14 ENCOUNTER — Ambulatory Visit: Payer: Medicare HMO | Admitting: Family Medicine

## 2018-12-14 DIAGNOSIS — E559 Vitamin D deficiency, unspecified: Secondary | ICD-10-CM | POA: Diagnosis not present

## 2018-12-14 DIAGNOSIS — I10 Essential (primary) hypertension: Secondary | ICD-10-CM

## 2018-12-14 DIAGNOSIS — R739 Hyperglycemia, unspecified: Secondary | ICD-10-CM

## 2018-12-14 DIAGNOSIS — E538 Deficiency of other specified B group vitamins: Secondary | ICD-10-CM

## 2018-12-14 DIAGNOSIS — F0390 Unspecified dementia without behavioral disturbance: Secondary | ICD-10-CM

## 2018-12-14 DIAGNOSIS — E782 Mixed hyperlipidemia: Secondary | ICD-10-CM | POA: Diagnosis not present

## 2018-12-14 DIAGNOSIS — E8809 Other disorders of plasma-protein metabolism, not elsewhere classified: Secondary | ICD-10-CM | POA: Diagnosis not present

## 2018-12-14 DIAGNOSIS — R69 Illness, unspecified: Secondary | ICD-10-CM | POA: Diagnosis not present

## 2018-12-16 DIAGNOSIS — F0390 Unspecified dementia without behavioral disturbance: Secondary | ICD-10-CM | POA: Insufficient documentation

## 2018-12-16 NOTE — Assessment & Plan Note (Signed)
Supplement and monitor 

## 2018-12-16 NOTE — Progress Notes (Signed)
Virtual Visit via phone Note  I connected with [name] on 11/213/20 at 10:20 AM EST by a phone enabled telemedicine application and verified that I am speaking with the correct person using two identifiers.  Location: Patient: home  Provider: home   I discussed the limitations of evaluation and management by telemedicine and the availability of in person appointments. The patient expressed understanding and agreed to proceed. Magdalene Molly, CMA was able to get the patient set up on a phone visit after being unable to set up a video visit.    Subjective:    Patient ID: [name], male    DOB: Mar 16, 1931, 85 y.o.   MRN: [id number]  No chief complaint on file.   HPI Patient is in today for follow up on chronic medical concerns including hypertension, dementia, hyperglycemia and more. He is at home with his wife and they are maintaining quarantine well. He is trying to stay active. He is eating well. His dementia is slowly worsening but they are managing well at home most days. Denies CP/palp/SOB/HA/congestion/fevers/GI or GU c/o. Taking meds as prescribed  Past Medical History:  Diagnosis Date  . AAA (abdominal aortic aneurysm) ([place])   . Abdominal pain 04/14/2011  . Acute bronchitis 09/02/2013  . Acute vascular insufficiency of intestine (HCC)    Mesenteric embolus...surgery 2003  . Allergic rhinitis 09/09/2013  . Anxiety   . Aortic atherosclerosis ([place]) 11/10/2015  . Atrial fibrillation (HCC)    paroxysmal  . Back pain 08/08/2016  . Biventricular implantable cardiac defibrillator in situ    GDT CONTAK H170-MADIT-CRT-EXPLANTED 2011 implanted defibrillator- Guidant cognis, model n119-2001 Dr. Caryl Comes (11/28/2009)  . Breast tenderness    Spironolactone was stopped and eplerinone started. Patient feels better  . CAD (coronary artery disease)    CABG 1994  /   catheterization 1996, occluded vein graft to the diagonal, other grafts patent  /   catheterization 2006, no PCI  /   nuclear, October, 2011, extensive scar anteroseptal and apical, no ischemia  . Carotid artery disease ([place])    Doppler, February,  2012, 0-39% bilateral, recommended followup 1 year  . Chronic systolic heart failure (HCC)    chronic systolic   . Chronotropic incompetence   . Cirrhosis, nonalcoholic ([place]) 04/29/760  . Colonic polyp 2010   pathology not clear.   . Crohn's disease ([place])   . Degenerative joint disease   . Dermatitis 04/05/2016  . Dizziness    Positional dizziness  . Edema leg    Venous Dopplers 8/13: No DVT bilaterally  . Ejection fraction    EF 35%, echo, November, 2011  //   Is 35-40%, echo,  September 23, 2011   . Fatigue 06/25/2008   Qualifier: Diagnosis of  By: [name], MD, Leonidas Romberg Dorinda Hill   . GERD 02/21/2007   Qualifier: Diagnosis of  By: Ronnald Ramp CNA/MA, Janett Billow    . Hyperglycemia 08/08/2016  . Hyperlipemia   . Hypertension   . Hypertrophy of prostate with urinary obstruction and other lower urinary tract symptoms (LUTS)   . Hypoalbuminemia 06/16/2013  . Hypokalemia 06/18/2013  . Implantable cardioverter-defibrillator (ICD) in situ 12/09/2009   Annotation: EXPLANTED 2011   //   New Guidant ICD implanted 2011     . Iron deficiency anemia   . Ischemic cardiomyopathy    CABG 1994 CAD catherization 1996.[name] occluded vein graft to the diagnol, other grafts patent/  catherization 2006, no PCI/ nuclear.[name] october 2011.[name] extensive scar anteroseptal and apical.. no ischemia    .  LBBB (left bundle branch block)   . MCI (mild cognitive impairment) 02/27/2017  . 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 Mitral regurgitation    mild.[name] echo november 2011  EF 35%...echo.. november 2009/ ef 35%.[name] echo november 2011  . Oral thrush 02/05/2015  . OSA (obstructive sleep apnea) 09/16/2011   NPSG 2013:  AHI 34/hr.    . Polymyalgia rheumatica ([place])   . Shingles   . Sinus bradycardia   . Skin cancer 08/08/2016  . Thrombocytopenia ([place]) 2008  . Thrush   . Tremor   . Ventral hernia    multiple,  wears an abd binder, reports he hs been told he was at increased riisk for surgery  . Vitamin B12 deficiency   . Vitamin D deficiency 11/06/2008   Qualifier: Diagnosis of  By: Lenna Gilford MD, Deborra Medina   . Warfarin anticoagulation    Coumadin therapy for mesenteric embolus 2003    Past Surgical History:  Procedure Laterality Date  . BIV ICD GENERATOR CHANGEOUT N/A 10/23/2017   Procedure: BIV ICD GENERATOR CHANGEOUT;  Surgeon: Deboraha Sprang, MD;  Location: [facility];  Service: Cardiovascular;  Laterality: N/A;  . CATARACT EXTRACTION, BILATERAL  2014  . COLONOSCOPY N/A 04/19/2013   Procedure: COLONOSCOPY;  Surgeon: Jerene Bears, MD;  Location: [facility] ENDOSCOPY;  Service: Endoscopy;  Laterality: N/A;  . CORONARY ARTERY BYPASS GRAFT  1994  . Elap w/ superior mesenteric artery embolectomy  1/03   by Dr. Jennette Banker  . ESOPHAGOGASTRODUODENOSCOPY N/A 04/18/2013   Procedure: ESOPHAGOGASTRODUODENOSCOPY (EGD);  Surgeon: Jerene Bears, MD;  Location: [name] ENDOSCOPY;  Service: Endoscopy;  Laterality: N/A;  . implantation of biventric cardioverter-defibrillatorr     by Dr. Lovena Le in 8/06 Guidant Contak H170-explanted 2011  . implantation of guidant cognis device     model n119-2011  . laparoscopic cholecystectomy  2002  . left inguinal hernia repair with mesh  6/08   by Dr. Betsy Pries  . PROSTATE SURGERY  08/2012   by urology in [place]  . Repair of incarcerated ventral hernia and lysis of adhesions  11/03   by Dr. Betsy Pries    Family History  Problem Relation Age of Onset  . Heart disease Mother   . Tremor Sister   . Heart failure Sister   . Heart disease Father 35  . Hypertension Father   . Appendicitis Brother        ruptured  . Diabetes Son   . Heart disease Sister   . Hypertension Sister   . Dementia Sister   . Heart disease Sister   . Hypertension Brother   . Kidney disease Brother   . Myasthenia gravis Brother   . Cirrhosis Brother   . Leukemia Brother   . Hypertension Other   .  Hyperlipidemia Other   . Heart disease Sister   . Edema Sister        pedal edema    Social History   Socioeconomic History  . Marital status: Married    Spouse name: Kennyth Lose  . Number of children: 3  . Years of education: Not on file  . Highest education level: Not on file  Occupational History  . Occupation: bull Actuary  . Occupation: farmer    Fish farm manager: RETIRED  . Occupation: logger  Social Needs  . Financial resource strain: Not on file  . Food insecurity    Worry: Not on file    Inability: Not on file  . Transportation needs    Medical: Not  on file    Non-medical: Not on file  Tobacco Use  . Smoking status: Former Smoker    Packs/day: 1.00    Years: 20.00    Pack years: 20.00    Types: Cigarettes    Quit date: 01/31/1973    Years since quitting: 45.9  . Smokeless tobacco: Never Used  Substance and Sexual Activity  . Alcohol use: Yes    Alcohol/week: 0.0 standard drinks    Comment: occasional  . Drug use: No  . Sexual activity: Never    Comment: lives with wife, no dietary restrictions  Lifestyle  . Physical activity    Days per week: Not on file    Minutes per session: Not on file  . Stress: Not on file  Relationships  . Social Herbalist on phone: Not on file    Gets together: Not on file    Attends religious service: Not on file    Active member of club or organization: Not on file    Attends meetings of clubs or organizations: Not on file    Relationship status: Not on file  . Intimate partner violence    Fear of current or ex partner: Not on file    Emotionally abused: Not on file    Physically abused: Not on file    Forced sexual activity: Not on file  Other Topics Concern  . Not on file  Social History Narrative   Lives in [place], [place] with wife.     Outpatient Medications Prior to Visit  Medication Sig Dispense Refill  . Ascorbic Acid (VITAMIN C) 100 MG tablet Take 100 mg by mouth daily.    [name] azelastine (ASTELIN) 0.1 %  nasal spray Place 2 sprays into both nostrils 2 (two) times daily as needed for rhinitis or allergies. Use in each nostril as directed 30 mL 1  . carvedilol (COREG) 6.25 MG tablet TAKE 1 TABLET BY MOUTH TWICE DAILY WITH MEALS 180 tablet 0  . cholecalciferol (VITAMIN D) 1000 UNITS tablet Take 1,000 Units by mouth daily.     . cholestyramine (QUESTRAN) 4 g packet Take 1 packet (4 g total) by mouth daily. 30 each 3  . donepezil (ARICEPT) 10 MG tablet TAKE 1 TABLET BY MOUTH AT BEDTIME 90 tablet 0  . ferrous sulfate 325 (65 FE) MG tablet Take 325 mg by mouth daily with breakfast.    . furosemide (LASIX) 40 MG tablet Take 1 tablet (40 mg total) by mouth daily. Take extra Lasix 40 mg if weight increases > 3 lbs in 1 day or if he has increased leg swelling 90 tablet 3  . hydrocortisone cream 1 % Apply 1 application topically daily as needed for itching.    [name] lisinopril (ZESTRIL) 10 MG tablet Take 1 tablet by mouth once daily 90 tablet 0  . memantine (NAMENDA) 10 MG tablet Take 1 tablet (10 mg total) by mouth 2 (two) times daily. 60 tablet 2  . oxymetazoline (AFRIN) 0.05 % nasal spray Place 1 spray into both nostrils daily as needed for congestion.    . pantoprazole (PROTONIX) 40 MG tablet TAKE 1 TABLET BY MOUTH ONCE DAILY AT  NOON 90 tablet 1  . potassium chloride SA (K-DUR,KLOR-CON) 20 MEQ tablet TAKE 1 TABLET BY MOUTH ONCE DAILY 90 tablet 3  . simvastatin (ZOCOR) 20 MG tablet TAKE 1 TABLET BY MOUTH AT BEDTIME 90 tablet 0  . vitamin B-12 (CYANOCOBALAMIN) 1000 MCG tablet Take 1,000 mcg by mouth daily.     [name]  warfarin (COUMADIN) 5 MG tablet TAKE AS DIRECTED BY  COUMADIN  CLINIC 100 tablet 0   No facility-administered medications prior to visit.     No Known Allergies  Review of Systems  Constitutional: Positive for malaise/fatigue. Negative for fever.  HENT: Negative for congestion.   Eyes: Negative for blurred vision.  Respiratory: Negative for shortness of breath.   Cardiovascular: Negative for  chest pain, palpitations and leg swelling.  Gastrointestinal: Negative for abdominal pain, blood in stool and nausea.  Genitourinary: Negative for dysuria and frequency.  Musculoskeletal: Negative for falls.  Skin: Negative for rash.  Neurological: Negative for dizziness, loss of consciousness and headaches.  Endo/Heme/Allergies: Negative for environmental allergies.  Psychiatric/Behavioral: Positive for memory loss. Negative for depression. The patient is not nervous/anxious.        Objective:    Physical Exam unable to obtain via phone visit  BP 119/69   Pulse 70   SpO2 97%  Wt Readings from Last 3 Encounters:  04/03/18 185 lb (83.9 kg)  03/09/18 191 lb (86.6 kg)  02/09/18 189 lb (85.7 kg)    Diabetic Foot Exam - Simple   No data filed     Lab Results  Component Value Date   WBC 7.3 03/09/2018   HGB 13.7 03/09/2018   HCT 40.4 03/09/2018   PLT 57.0 (L) 03/09/2018   GLUCOSE 97 09/18/2018   CHOL 131 08/08/2016   TRIG 106.0 08/08/2016   HDL 52.10 08/08/2016   LDLDIRECT 81.6 12/30/2013   LDLCALC 58 08/08/2016   ALT 12 03/09/2018   AST 24 03/09/2018   NA 142 09/18/2018   K 4.9 09/18/2018   CL 104 09/18/2018   CREATININE 1.16 09/18/2018   BUN 10 09/18/2018   CO2 27 09/18/2018   TSH 2.68 02/27/2017   PSA 2.93 11/24/2010   INR 1.9 (A) 11/26/2018   HGBA1C 5.6 08/08/2016    Lab Results  Component Value Date   TSH 2.68 02/27/2017   Lab Results  Component Value Date   WBC 7.3 03/09/2018   HGB 13.7 03/09/2018   HCT 40.4 03/09/2018   MCV 90.7 03/09/2018   PLT 57.0 (L) 03/09/2018   Lab Results  Component Value Date   NA 142 09/18/2018   K 4.9 09/18/2018   CO2 27 09/18/2018   GLUCOSE 97 09/18/2018   BUN 10 09/18/2018   CREATININE 1.16 09/18/2018   BILITOT 2.3 (H) 03/09/2018   ALKPHOS 85 03/09/2018   AST 24 03/09/2018   ALT 12 03/09/2018   PROT 5.4 (L) 03/09/2018   ALBUMIN 3.6 03/09/2018   CALCIUM 9.5 09/18/2018   ANIONGAP 7 09/03/2016   GFR 67.91  03/09/2018   Lab Results  Component Value Date   CHOL 131 08/08/2016   Lab Results  Component Value Date   HDL 52.10 08/08/2016   Lab Results  Component Value Date   LDLCALC 58 08/08/2016   Lab Results  Component Value Date   TRIG 106.0 08/08/2016   Lab Results  Component Value Date   CHOLHDL 3 08/08/2016   Lab Results  Component Value Date   HGBA1C 5.6 08/08/2016       Assessment & Plan:   Problem List Items Addressed This Visit    Vitamin B 12 deficiency    Supplement and monitor      Vitamin D deficiency    Supplement and monitor      Hypertension    Well controlled, no changes to meds. Encouraged heart healthy diet such as the  DASH diet and exercise as tolerated.       Hyperlipemia    Encouraged heart healthy diet, increase exercise, avoid trans fats, consider a krill oil cap daily, tolerating Simvastatin      Hypoalbuminemia    Continue to try and increase protein intake      Hyperglycemia    hgba1c acceptable, minimize simple carbs. Increase exercise as tolerated.      Dementia ([place])    Is tolerating meds and is managing at home with his wife still.          I am having [name] "[name]" maintain his cholecalciferol, vitamin B-12, oxymetazoline, hydrocortisone cream, potassium chloride SA, ferrous sulfate, vitamin C, azelastine, memantine, warfarin, cholestyramine, lisinopril, furosemide, pantoprazole, donepezil, carvedilol, and simvastatin.  No orders of the defined types were placed in this encounter.    I discussed the assessment and treatment plan with the patient. The patient was provided an opportunity to ask questions and all were answered. The patient agreed with the plan and demonstrated an understanding of the instructions.   The patient was advised to call back or seek an in-person evaluation if the symptoms worsen or if the condition fails to improve as anticipated.  I provided 25 minutes of non-face-to-face time during this  encounter.   Penni Homans, MD

## 2018-12-16 NOTE — Assessment & Plan Note (Signed)
Continue to try and increase protein intake

## 2018-12-16 NOTE — Assessment & Plan Note (Signed)
Is tolerating meds and is managing at home with his wife still.

## 2018-12-16 NOTE — Assessment & Plan Note (Signed)
Well controlled, no changes to meds. Encouraged heart healthy diet such as the DASH diet and exercise as tolerated.  °

## 2018-12-16 NOTE — Assessment & Plan Note (Addendum)
Encouraged heart healthy diet, increase exercise, avoid trans fats, consider a krill oil cap daily, tolerating Simvastatin

## 2018-12-16 NOTE — Assessment & Plan Note (Signed)
hgba1c acceptable, minimize simple carbs. Increase exercise as tolerated.  

## 2018-12-17 ENCOUNTER — Ambulatory Visit (INDEPENDENT_AMBULATORY_CARE_PROVIDER_SITE_OTHER): Payer: Medicare HMO

## 2018-12-17 DIAGNOSIS — Z95810 Presence of automatic (implantable) cardiac defibrillator: Secondary | ICD-10-CM | POA: Diagnosis not present

## 2018-12-17 DIAGNOSIS — I5022 Chronic systolic (congestive) heart failure: Secondary | ICD-10-CM | POA: Diagnosis not present

## 2018-12-19 ENCOUNTER — Telehealth: Payer: Self-pay

## 2018-12-19 NOTE — Progress Notes (Signed)
EPIC Encounter for ICM Monitoring  Patient Name: [name] is a 85 y.o. male Date: 12/19/2018 Primary Care Physican: [name], MD Primary Cardiologist:Cooper/Weaver, PA Electrophysiologist: Caryl Comes Last Weight:185lbs Right Ventricular:94% Left Ventricular:92%  AT/AF Overview Since Last Reset Feb 09, 2018   AT/AF2%  AT/AF [id number]  Total Time in AT/AF (days)5.0  Most Recent (Dec 17, 2018 00:13)  Avg V Rate, Duration (hh:mm:ss)60 bpm, 00:00:01  Longest (Feb 27, 2018 19:07) Avg V Rate, Duration (hh:mm:ss) 76 bpm, 29:22:12  Fastest RVS Rate (Nov 21, 2018 15:16) Max V Rate, Duration (hh:mm:ss)133 bpm, 07:55:25  Attempted call to patient and unable to reach.  Transmission reviewed.   12/16/2018 [employer] Heart Failure Index is3suggesting normalthreshold range.  Prescribed:Furosemide 40 mg Take 1 tablet (40 mg total) by mouth daily. Take extra Lasix 40 mg if weight increases > 3 lbs in 1 day or if he has increased leg swelling.Potassium 20 mEq 1 tablet daily  Labs: 09/18/2018 Creatinine 1.16, BUN 10, Potassium 4.9, Sodium 142, GFR 57-66 03/09/2018 Creatinine 1.04, BUN 8, Potassium 4.2, Sodium 144, GFR 67.91  Recommendations: Unable to reach.    Follow-up plan: ICM clinic phone appointment on 01/30/2019.   91 day device clinic remote transmission 02/19/2019.  Office appt 12/31/2018 with Dr. Caryl Comes.          Copy of ICM check sent to Dr. Caryl Comes.    Rosalene Billings, RN 12/19/2018 3:47 PM

## 2018-12-19 NOTE — Telephone Encounter (Signed)
Remote ICM transmission received.  Attempted call to patient regarding ICM remote transmission and mail box is full.

## 2018-12-21 ENCOUNTER — Ambulatory Visit: Payer: Medicare HMO | Admitting: Vascular Surgery

## 2018-12-25 ENCOUNTER — Ambulatory Visit (INDEPENDENT_AMBULATORY_CARE_PROVIDER_SITE_OTHER): Payer: Medicare HMO | Admitting: *Deleted

## 2018-12-25 ENCOUNTER — Other Ambulatory Visit: Payer: Self-pay

## 2018-12-25 DIAGNOSIS — K55069 Acute infarction of intestine, part and extent unspecified: Secondary | ICD-10-CM

## 2018-12-25 DIAGNOSIS — Z5181 Encounter for therapeutic drug level monitoring: Secondary | ICD-10-CM

## 2018-12-25 DIAGNOSIS — Z0000 Encounter for general adult medical examination without abnormal findings: Secondary | ICD-10-CM

## 2018-12-25 LAB — POCT INR: INR: 2.8 (ref 2.0–3.0)

## 2018-12-25 NOTE — Patient Instructions (Signed)
Continue warfarin 1 tablet daily and except for 1/2 tablet on Tuesdays, Thursdays and Saturdays. Recheck in 4 weeks.  Call us with any medication changes or concerns # [phone].  keep intake of greens consistent and drink only 2 Ensures per week.

## 2018-12-28 ENCOUNTER — Other Ambulatory Visit: Payer: Self-pay | Admitting: Cardiology

## 2018-12-31 ENCOUNTER — Encounter: Payer: Self-pay | Admitting: Internal Medicine

## 2018-12-31 ENCOUNTER — Other Ambulatory Visit: Payer: Self-pay

## 2018-12-31 ENCOUNTER — Other Ambulatory Visit: Payer: Self-pay | Admitting: Family Medicine

## 2018-12-31 ENCOUNTER — Ambulatory Visit (INDEPENDENT_AMBULATORY_CARE_PROVIDER_SITE_OTHER): Payer: Medicare HMO | Admitting: Internal Medicine

## 2018-12-31 VITALS — BP 120/72 | HR 61 | Ht 72.0 in | Wt 192.2 lb

## 2018-12-31 DIAGNOSIS — I255 Ischemic cardiomyopathy: Secondary | ICD-10-CM | POA: Diagnosis not present

## 2018-12-31 DIAGNOSIS — Z79899 Other long term (current) drug therapy: Secondary | ICD-10-CM

## 2018-12-31 DIAGNOSIS — I5022 Chronic systolic (congestive) heart failure: Secondary | ICD-10-CM | POA: Diagnosis not present

## 2018-12-31 NOTE — Progress Notes (Signed)
Patient Care Team: [name], MD as PCP - General (Family Medicine) Sherren Mocha, MD as PCP - Cardiology (Cardiology) Deboraha Sprang, MD as PCP - Electrophysiology (Cardiology) Pyrtle, Lajuan Lines, MD as Consulting Physician (Gastroenterology) Darleen Crocker, MD as Consulting Physician (Ophthalmology) Dorothy Spark, MD as Consulting Physician (Cardiology) Veva Holes as Consulting Physician (Urology) Waynetta Sandy, MD as Consulting Physician (Vascular Surgery)   HPI  [name] is a 85 y.o. male ICD implanted for primary prevention in the setting of ischemic coronary disease with prior CABG as part of the MADIT-CRT trial.   He has chronic thrombocytopenia.  Last note evident in epic is from 2015  His last catheterization in 2006 demonstrated a total right and 80% marginal. Vein graft to the diagonal was occluded and other bypasses were open; ejection fraction 2007 was about 35%  Nuclear scan Oct 2011>>scar no ischemia  Echo 10/16  EF 35-40%   The patient denies chest pain, shortness of breath, nocturnal dyspnea, orthopnea-- but has modest peripheral edema which bothers him little.  There have been no palpitations, lightheadedness or syncope.     9/19 presented with device failure secondary to battery--CRT-D  Date Cr K  Hgb Plt  8/18 1.36 3.6 14.1    9/19 1.05 4.2 13.8   8/20 1.16 4.9 13.7(2/20) 57        Past Medical History:  Diagnosis Date  . AAA (abdominal aortic aneurysm) ([place])   . Abdominal pain 04/14/2011  . Acute bronchitis 09/02/2013  . Acute vascular insufficiency of intestine (HCC)    Mesenteric embolus...surgery 2003  . Allergic rhinitis 09/09/2013  . Anxiety   . Aortic atherosclerosis ([place]) 11/10/2015  . Atrial fibrillation (HCC)    paroxysmal  . Back pain 08/08/2016  . Biventricular implantable cardiac defibrillator in situ    GDT CONTAK H170-MADIT-CRT-EXPLANTED 2011 implanted defibrillator- Guidant cognis, model n119-2001  Dr. Caryl Comes (11/28/2009)  . Breast tenderness    Spironolactone was stopped and eplerinone started. Patient feels better  . CAD (coronary artery disease)    CABG 1994  /   catheterization 1996, occluded vein graft to the diagonal, other grafts patent  /   catheterization 2006, no PCI  /  nuclear, October, 2011, extensive scar anteroseptal and apical, no ischemia  . Carotid artery disease ([place])    Doppler, February,  2012, 0-39% bilateral, recommended followup 1 year  . Chronic systolic heart failure (HCC)    chronic systolic   . Chronotropic incompetence   . Cirrhosis, nonalcoholic ([place]) 05/10/7351  . Colonic polyp 2010   pathology not clear.   . Crohn's disease ([place])   . Degenerative joint disease   . Dermatitis 04/05/2016  . Dizziness    Positional dizziness  . Edema leg    Venous Dopplers 8/13: No DVT bilaterally  . Ejection fraction    EF 35%, echo, November, 2011  //   Is 35-40%, echo,  September 23, 2011   . Fatigue 06/25/2008   Qualifier: Diagnosis of  By: [name], MD, Leonidas Romberg Dorinda Hill   . GERD 02/21/2007   Qualifier: Diagnosis of  By: Ronnald Ramp CNA/MA, Janett Billow    . Hyperglycemia 08/08/2016  . Hyperlipemia   . Hypertension   . Hypertrophy of prostate with urinary obstruction and other lower urinary tract symptoms (LUTS)   . Hypoalbuminemia 06/16/2013  . Hypokalemia 06/18/2013  . Implantable cardioverter-defibrillator (ICD) in situ 12/09/2009   Annotation: EXPLANTED 2011   //   New Guidant  ICD implanted 2011     . Iron deficiency anemia   . Ischemic cardiomyopathy    CABG 1994 CAD catherization 1996.[name] occluded vein graft to the diagnol, other grafts patent/  catherization 2006, no PCI/ nuclear.[name] october 2011.[name] extensive scar anteroseptal and apical.. no ischemia    . LBBB (left bundle branch block)   . MCI (mild cognitive impairment) 02/27/2017  . 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 Mitral regurgitation    mild.[name] echo november 2011  EF 35%...echo.. november 2009/ ef 35%.[name] echo  november 2011  . Oral thrush 02/05/2015  . OSA (obstructive sleep apnea) 09/16/2011   NPSG 2013:  AHI 34/hr.    . Polymyalgia rheumatica ([place])   . Shingles   . Sinus bradycardia   . Skin cancer 08/08/2016  . Thrombocytopenia ([place]) 2008  . Thrush   . Tremor   . Ventral hernia    multiple, wears an abd binder, reports he hs been told he was at increased riisk for surgery  . Vitamin B12 deficiency   . Vitamin D deficiency 11/06/2008   Qualifier: Diagnosis of  By: Lenna Gilford MD, Deborra [place]   . Warfarin anticoagulation    Coumadin therapy for mesenteric embolus 2003    Past Surgical History:  Procedure Laterality Date  . BIV ICD GENERATOR CHANGEOUT N/A 10/23/2017   Procedure: BIV ICD GENERATOR CHANGEOUT;  Surgeon: Deboraha Sprang, MD;  Location: [facility];  Service: Cardiovascular;  Laterality: N/A;  . CATARACT EXTRACTION, BILATERAL  2014  . COLONOSCOPY N/A 04/19/2013   Procedure: COLONOSCOPY;  Surgeon: Jerene Bears, MD;  Location: [facility] ENDOSCOPY;  Service: Endoscopy;  Laterality: N/A;  . CORONARY ARTERY BYPASS GRAFT  1994  . Elap w/ superior mesenteric artery embolectomy  1/03   by Dr. Jennette Banker  . ESOPHAGOGASTRODUODENOSCOPY N/A 04/18/2013   Procedure: ESOPHAGOGASTRODUODENOSCOPY (EGD);  Surgeon: Jerene Bears, MD;  Location: [facility] ENDOSCOPY;  Service: Endoscopy;  Laterality: N/A;  . implantation of biventric cardioverter-defibrillatorr     by Dr. Lovena Le in 8/06 Guidant Contak H170-explanted 2011  . implantation of guidant cognis device     model n119-2011  . laparoscopic cholecystectomy  2002  . left inguinal hernia repair with mesh  6/08   by Dr. Betsy Pries  . PROSTATE SURGERY  08/2012   by urology in [place]  . Repair of incarcerated ventral hernia and lysis of adhesions  11/03   by Dr. Betsy Pries    Current Outpatient Medications  Medication Sig Dispense Refill  . Ascorbic Acid (VITAMIN C) 100 MG tablet Take 100 mg by mouth daily.    [name] azelastine (ASTELIN) 0.1 % nasal spray Place 2 sprays  into both nostrils 2 (two) times daily as needed for rhinitis or allergies. Use in each nostril as directed 30 mL 1  . carvedilol (COREG) 6.25 MG tablet TAKE 1 TABLET BY MOUTH TWICE DAILY WITH MEALS 180 tablet 0  . cholecalciferol (VITAMIN D) 1000 UNITS tablet Take 1,000 Units by mouth daily.     . cholestyramine (QUESTRAN) 4 g packet Take 1 packet (4 g total) by mouth daily. 30 each 3  . donepezil (ARICEPT) 10 MG tablet TAKE 1 TABLET BY MOUTH AT BEDTIME 90 tablet 0  . ferrous sulfate 325 (65 FE) MG tablet Take 325 mg by mouth daily with breakfast.    . furosemide (LASIX) 40 MG tablet Take 1 tablet (40 mg total) by mouth daily. Take extra Lasix 40 mg if weight increases > 3 lbs in 1 day or if  he has increased leg swelling 90 tablet 3  . hydrocortisone cream 1 % Apply 1 application topically daily as needed for itching.    [name] lisinopril (ZESTRIL) 10 MG tablet Take 1 tablet by mouth once daily 90 tablet 2  . memantine (NAMENDA) 10 MG tablet Take 1 tablet by mouth twice daily 60 tablet 0  . oxymetazoline (AFRIN) 0.05 % nasal spray Place 1 spray into both nostrils daily as needed for congestion.    . pantoprazole (PROTONIX) 40 MG tablet TAKE 1 TABLET BY MOUTH ONCE DAILY AT  NOON 90 tablet 1  . potassium chloride SA (K-DUR,KLOR-CON) 20 MEQ tablet TAKE 1 TABLET BY MOUTH ONCE DAILY 90 tablet 3  . simvastatin (ZOCOR) 20 MG tablet TAKE 1 TABLET BY MOUTH AT BEDTIME 90 tablet 0  . vitamin B-12 (CYANOCOBALAMIN) 1000 MCG tablet Take 1,000 mcg by mouth daily.     [name] warfarin (COUMADIN) 5 MG tablet TAKE AS DIRECTED BY  COUMADIN  CLINIC 100 tablet 0   No current facility-administered medications for this visit.     No Known Allergies  Review of Systems negative except from HPI and PMH  Physical Exam BP 120/72   Pulse 61   Ht 6' (1.829 m)   Wt 192 lb 3.2 oz (87.2 kg)   SpO2 94%   BMI 26.07 kg/m  Well developed and well nourished in no acute distress HENT normal Neck supple with JVP-flat Clear Device  pocket well healed; without hematoma or erythema.  There is no tethering  Regular rate and rhythm, no  * murmur Abd-soft with active BS No Clubbing cyanosis 2+ edema Skin-warm and dry A & Oriented  Grossly normal sensory and motor function  ECG sinus P-synchronous/ AV  pacing QRS neg lead 1 and upright V1   Assessment and  Plan  Ischemic cardiomyopathy  Implantable defibrillator-[employer] CRT -   Congestive heart-chronic-systolic  Thrombocytopenia chronic   Without symptoms of ischemia  Volume overloaded we will increase his furosemide from 40--80.x 3 d  He does not urinate very briskly with 40 mg

## 2018-12-31 NOTE — Patient Instructions (Signed)
Medication Instructions:  Your physician has recommended you make the following change in your medication:  1.  INCREASE Lasix to 80 mg daily for 3 days, then return to normal dosing of 40 mg daily  *If you need a refill on your cardiac medications before your next appointment, please call your pharmacy*  Labwork: None ordered  Testing/Procedures: None ordered  Follow-Up: Remote monitoring is used to monitor your Pacemaker or ICD from home. This monitoring reduces the number of office visits required to check your device to one time per year. It allows [place] to keep an eye on the functioning of your device to ensure it is working properly. You are scheduled for a device check from home on 01/30/19. You may send your transmission at any time that day. If you have a wireless device, the transmission will be sent automatically. After your physician reviews your transmission, you will receive a postcard with your next transmission date.  At [facility], you and your health needs are our priority.  As part of our continuing mission to provide you with exceptional heart care, we have created designated Provider Care Teams.  These Care Teams include your primary Cardiologist (physician) and Advanced Practice Providers (APPs -  Physician Assistants and Nurse Practitioners) who all work together to provide you with the care you need, when you need it.  You will need a follow up appointment in 12 months.  Please call our office 2 months in advance to schedule this appointment.  You may see Dr Aldona Bar one of the following Advanced Practice Providers on your designated Care Team:    Chanetta Marshall, NP  Tommye Standard, PA-C  Oda Kilts, [place]  Thank you for choosing [facility]!!

## 2019-01-01 ENCOUNTER — Other Ambulatory Visit (INDEPENDENT_AMBULATORY_CARE_PROVIDER_SITE_OTHER): Payer: Medicare HMO

## 2019-01-01 DIAGNOSIS — E538 Deficiency of other specified B group vitamins: Secondary | ICD-10-CM | POA: Diagnosis not present

## 2019-01-01 DIAGNOSIS — E782 Mixed hyperlipidemia: Secondary | ICD-10-CM

## 2019-01-01 DIAGNOSIS — E559 Vitamin D deficiency, unspecified: Secondary | ICD-10-CM | POA: Diagnosis not present

## 2019-01-01 DIAGNOSIS — I10 Essential (primary) hypertension: Secondary | ICD-10-CM

## 2019-01-01 DIAGNOSIS — R739 Hyperglycemia, unspecified: Secondary | ICD-10-CM | POA: Diagnosis not present

## 2019-01-01 LAB — CBC
HCT: 38.6 % — ABNORMAL LOW (ref 39.0–52.0)
Hemoglobin: 12.9 g/dL — ABNORMAL LOW (ref 13.0–17.0)
MCHC: 33.4 g/dL (ref 30.0–36.0)
MCV: 92.5 fl (ref 78.0–100.0)
Platelets: 50 10*3/uL — ABNORMAL LOW (ref 150.0–400.0)
RBC: 4.17 Mil/uL — ABNORMAL LOW (ref 4.22–5.81)
RDW: 15.8 % — ABNORMAL HIGH (ref 11.5–15.5)
WBC: 6.1 10*3/uL (ref 4.0–10.5)

## 2019-01-01 LAB — COMPREHENSIVE METABOLIC PANEL
ALT: 16 U/L (ref 0–53)
AST: 33 U/L (ref 0–37)
Albumin: 3.4 g/dL — ABNORMAL LOW (ref 3.5–5.2)
Alkaline Phosphatase: 96 U/L (ref 39–117)
BUN: 12 mg/dL (ref 6–23)
CO2: 32 mEq/L (ref 19–32)
Calcium: 8.8 mg/dL (ref 8.4–10.5)
Chloride: 103 mEq/L (ref 96–112)
Creatinine, Ser: 1.2 mg/dL (ref 0.40–1.50)
GFR: 57.46 mL/min — ABNORMAL LOW (ref >60.00)
Glucose, Bld: 182 mg/dL — ABNORMAL HIGH (ref 70–99)
Potassium: 4.2 mEq/L (ref 3.5–5.1)
Sodium: 140 mEq/L (ref 135–145)
Total Bilirubin: 1.9 mg/dL — ABNORMAL HIGH (ref 0.2–1.2)
Total Protein: 5 g/dL — ABNORMAL LOW (ref 6.0–8.3)

## 2019-01-01 LAB — LIPID PANEL
Cholesterol: 121 mg/dL (ref 0–200)
HDL: 46.4 mg/dL (ref >39.00)
LDL Cholesterol: 51 mg/dL (ref 0–99)
NonHDL: 74.26
Total CHOL/HDL Ratio: 3
Triglycerides: 116 mg/dL (ref 0.0–149.0)
VLDL: 23.2 mg/dL (ref 0.0–40.0)

## 2019-01-01 LAB — VITAMIN B12: Vitamin B-12: 651 pg/mL (ref 211–911)

## 2019-01-01 LAB — VITAMIN D 25 HYDROXY (VIT D DEFICIENCY, FRACTURES): VITD: 31.68 ng/mL (ref 30.00–100.00)

## 2019-01-01 LAB — HEMOGLOBIN A1C: Hgb A1c MFr Bld: 5.2 % (ref 4.6–6.5)

## 2019-01-03 DIAGNOSIS — E611 Iron deficiency: Secondary | ICD-10-CM

## 2019-01-03 DIAGNOSIS — K625 Hemorrhage of anus and rectum: Secondary | ICD-10-CM

## 2019-01-15 ENCOUNTER — Other Ambulatory Visit: Payer: Self-pay

## 2019-01-15 ENCOUNTER — Ambulatory Visit: Payer: Medicare HMO | Admitting: *Deleted

## 2019-01-15 DIAGNOSIS — Z5181 Encounter for therapeutic drug level monitoring: Secondary | ICD-10-CM

## 2019-01-15 DIAGNOSIS — K55069 Acute infarction of intestine, part and extent unspecified: Secondary | ICD-10-CM | POA: Diagnosis not present

## 2019-01-15 LAB — POCT INR: INR: 2.4 (ref 2.0–3.0)

## 2019-01-15 MED ORDER — WARFARIN SODIUM 5 MG PO TABS: ORAL_TABLET | ORAL | 0 refills | Status: DC

## 2019-01-15 NOTE — Patient Instructions (Signed)
Description   Continue warfarin 1 tablet daily and except for 1/2 tablet on Tuesdays, Thursdays and Saturdays. Recheck in 3 weeks.  Call us with any medication changes or concerns # [phone].

## 2019-01-30 ENCOUNTER — Ambulatory Visit (INDEPENDENT_AMBULATORY_CARE_PROVIDER_SITE_OTHER): Payer: Medicare HMO

## 2019-01-30 DIAGNOSIS — I5022 Chronic systolic (congestive) heart failure: Secondary | ICD-10-CM | POA: Diagnosis not present

## 2019-01-30 DIAGNOSIS — Z95810 Presence of automatic (implantable) cardiac defibrillator: Secondary | ICD-10-CM

## 2019-01-30 NOTE — Progress Notes (Signed)
EPIC Encounter for ICM Monitoring  Patient Name: [name] is a 85 y.o. male Date: 01/30/2019 Primary Care Physican: [name], MD Primary Cardiologist:Cooper/Weaver, PA Electrophysiologist: Caryl Comes Last Weight:185lbs Right Ventricular:95% Left Ventricular:94%     Transmission reviewed.   12/29/2020[employer] Heart Failure Index is0suggesting within normalthreshold range.  Prescribed:Furosemide 40 mgTake 1 tablet (40 mg total) by mouth daily. Take extra Lasix 40 mg if weight increases > 3 lbs in 1 day or if he has increased leg swelling.Potassium 20 mEq 1 tablet daily  Labs: 01/01/2019 Creatinine 1.20, BUN 12, Potassium 4.2, Sodium 140, GFR 57.46 09/18/2018 Creatinine 1.16, BUN 10, Potassium 4.9, Sodium 142, GFR 57-66 03/09/2018 Creatinine 1.04, BUN 8, Potassium 4.2, Sodium 144, GFR 67.91  Recommendations:  None    Follow-up plan: ICM clinic phone appointment on 03/04/2019.   91 day device clinic remote transmission 02/19/2019.  Office appt 03/04/2019 with Dr Burt Knack.          Copy of ICM check sent to Dr. Caryl Comes.     [place], RN 01/30/2019 8:11 AM

## 2019-02-05 ENCOUNTER — Ambulatory Visit (INDEPENDENT_AMBULATORY_CARE_PROVIDER_SITE_OTHER): Payer: Medicare HMO | Admitting: *Deleted

## 2019-02-05 ENCOUNTER — Other Ambulatory Visit: Payer: Self-pay

## 2019-02-05 DIAGNOSIS — Z0000 Encounter for general adult medical examination without abnormal findings: Secondary | ICD-10-CM | POA: Diagnosis not present

## 2019-02-05 DIAGNOSIS — Z5181 Encounter for therapeutic drug level monitoring: Secondary | ICD-10-CM

## 2019-02-05 DIAGNOSIS — K55069 Acute infarction of intestine, part and extent unspecified: Secondary | ICD-10-CM | POA: Diagnosis not present

## 2019-02-05 LAB — POCT INR: INR: 2.7 (ref 2.0–3.0)

## 2019-02-05 NOTE — Patient Instructions (Signed)
Continue warfarin 1 tablet daily and except for 1/2 tablet on Tuesdays, Thursdays and Saturdays. Recheck in 4 weeks.  Call us with any medication changes or concerns # [phone].

## 2019-02-07 LAB — CUP PACEART INCLINIC DEVICE CHECK
Date Time Interrogation Session: 20201130090124
Implantable Lead Implant Date: 20060808
Implantable Lead Implant Date: 20060808
Implantable Lead Implant Date: 20060911
Implantable Lead Location: 753858
Implantable Lead Location: 753859
Implantable Lead Location: 753860
Implantable Lead Model: 158
Implantable Lead Model: 4087
Implantable Lead Serial Number: 165392
Implantable Lead Serial Number: 261368
Implantable Pulse Generator Implant Date: 20190923
Pulse Gen Serial Number: 134188

## 2019-02-16 ENCOUNTER — Other Ambulatory Visit: Payer: Self-pay | Admitting: Family Medicine

## 2019-02-19 ENCOUNTER — Ambulatory Visit (INDEPENDENT_AMBULATORY_CARE_PROVIDER_SITE_OTHER): Payer: Medicare HMO | Admitting: *Deleted

## 2019-02-19 DIAGNOSIS — I480 Paroxysmal atrial fibrillation: Secondary | ICD-10-CM | POA: Diagnosis not present

## 2019-02-19 LAB — CUP PACEART REMOTE DEVICE CHECK
Battery Remaining Longevity: 102 mo
Battery Remaining Percentage: 100 %
Brady Statistic RA Percent Paced: 90 %
Brady Statistic RV Percent Paced: 94 %
Date Time Interrogation Session: 20210119012100
HighPow Impedance: 36 Ohm
Implantable Lead Implant Date: 20060808
Implantable Lead Implant Date: 20060808
Implantable Lead Implant Date: 20060911
Implantable Lead Location: 753858
Implantable Lead Location: 753859
Implantable Lead Location: 753860
Implantable Lead Model: 158
Implantable Lead Model: 4087
Implantable Lead Serial Number: 165392
Implantable Lead Serial Number: 261368
Implantable Pulse Generator Implant Date: 20190923
Lead Channel Impedance Value: 306 Ohm
Lead Channel Impedance Value: 403 Ohm
Lead Channel Impedance Value: 482 Ohm
Lead Channel Setting Pacing Amplitude: 2 V
Lead Channel Setting Pacing Amplitude: 2.3 V
Lead Channel Setting Pacing Amplitude: 2.4 V
Lead Channel Setting Pacing Pulse Width: 0.4 ms
Lead Channel Setting Pacing Pulse Width: 0.8 ms
Lead Channel Setting Sensing Sensitivity: 0.5 mV
Lead Channel Setting Sensing Sensitivity: 1 mV
Pulse Gen Serial Number: 134188

## 2019-03-04 ENCOUNTER — Ambulatory Visit (INDEPENDENT_AMBULATORY_CARE_PROVIDER_SITE_OTHER): Payer: Medicare HMO

## 2019-03-04 ENCOUNTER — Encounter: Payer: Self-pay | Admitting: Cardiovascular Disease

## 2019-03-04 ENCOUNTER — Ambulatory Visit: Payer: Medicare HMO | Admitting: Cardiovascular Disease

## 2019-03-04 ENCOUNTER — Other Ambulatory Visit: Payer: Self-pay

## 2019-03-04 VITALS — BP 124/72 | HR 63 | Ht 72.0 in | Wt 189.0 lb

## 2019-03-04 DIAGNOSIS — I5022 Chronic systolic (congestive) heart failure: Secondary | ICD-10-CM

## 2019-03-04 DIAGNOSIS — Z0000 Encounter for general adult medical examination without abnormal findings: Secondary | ICD-10-CM | POA: Diagnosis not present

## 2019-03-04 DIAGNOSIS — Z5181 Encounter for therapeutic drug level monitoring: Secondary | ICD-10-CM

## 2019-03-04 DIAGNOSIS — Z95810 Presence of automatic (implantable) cardiac defibrillator: Secondary | ICD-10-CM

## 2019-03-04 DIAGNOSIS — K55069 Acute infarction of intestine, part and extent unspecified: Secondary | ICD-10-CM | POA: Diagnosis not present

## 2019-03-04 DIAGNOSIS — I2510 Atherosclerotic heart disease of native coronary artery without angina pectoris: Secondary | ICD-10-CM

## 2019-03-04 DIAGNOSIS — I480 Paroxysmal atrial fibrillation: Secondary | ICD-10-CM | POA: Diagnosis not present

## 2019-03-04 LAB — POCT INR: INR: 2.2 (ref 2.0–3.0)

## 2019-03-04 NOTE — Patient Instructions (Signed)
Medication Instructions:  Your provider recommends that you continue on your current medications as directed. Please refer to the Current Medication list given to you today.   *If you need a refill on your cardiac medications before your next appointment, please call your pharmacy*  Follow-Up: At [facility], you and your health needs are our priority.  As part of our continuing mission to provide you with exceptional heart care, we have created designated Provider Care Teams.  These Care Teams include your primary Cardiologist (physician) and Advanced Practice Providers (APPs -  Physician Assistants and Nurse Practitioners) who all work together to provide you with the care you need, when you need it. Your next appointment:   6 month(s) The format for your next appointment:   In Person Provider:   Richardson Dopp, PA-C

## 2019-03-04 NOTE — Patient Instructions (Signed)
Description   Continue warfarin 1 tablet daily and except for 1/2 tablet on Tuesdays, Thursdays and Saturdays. Recheck in 4 weeks.  Call us with any medication changes or concerns # [phone].

## 2019-03-04 NOTE — Progress Notes (Signed)
Cardiology Office Note:    Date:  03/04/2019   ID:  [name], DOB 04/15/1931, MRN [id number]  PCP:  [name], MD  Cardiologist:  Sherren Mocha, MD  Electrophysiologist:  Virl Axe, MD   Referring MD: [name], MD   Chief Complaint  Patient presents with  . Coronary Artery Disease    History of Present Illness:    [name] is a 86 y.o. male with a hx of coronary artery disease status post CABG, chronic systolic heart failure with underlying ischemic cardiomyopathy, history of CRT-D, paroxysmal atrial fibrillation maintained on warfarin, small abdominal aortic aneurysm followed by vascular surgery, hypertension, and mixed hyperlipidemia.  The patient is followed regularly by Dr. Caryl Comes.  He sees Dr. Gwenlyn Saran with vascular surgery.  He has primarily been seen by Richardson Dopp most recently in our office.  This is my first encounter with him.  The patient is here with his wife today.  He seems to be getting along fairly well.  He does have some chronic swelling in his legs but this is not changed over time.  He denies any recent episodes of chest pain, chest pressure, orthopnea, or PND.  He has had no heart palpitations, lightheadedness, or syncope.  He denies any significant bleeding problems on warfarin.  He does admit to some bruising in his legs.  He is not too active anymore and is not limited by shortness of breath.  He has developed some memory problems and he is taking Aricept.  Past Medical History:  Diagnosis Date  . AAA (abdominal aortic aneurysm) ([place])   . Abdominal pain 04/14/2011  . Acute bronchitis 09/02/2013  . Acute vascular insufficiency of intestine (HCC)    Mesenteric embolus...surgery 2003  . Allergic rhinitis 09/09/2013  . Anxiety   . Aortic atherosclerosis ([place]) 11/10/2015  . Atrial fibrillation (HCC)    paroxysmal  . Back pain 08/08/2016  . Biventricular implantable cardiac defibrillator in situ    GDT CONTAK H170-MADIT-CRT-EXPLANTED 2011  implanted defibrillator- Guidant cognis, model n119-2001 Dr. Caryl Comes (11/28/2009)  . Breast tenderness    Spironolactone was stopped and eplerinone started. Patient feels better  . CAD (coronary artery disease)    CABG 1994  /   catheterization 1996, occluded vein graft to the diagonal, other grafts patent  /   catheterization 2006, no PCI  /  nuclear, October, 2011, extensive scar anteroseptal and apical, no ischemia  . Carotid artery disease ([place])    Doppler, February,  2012, 0-39% bilateral, recommended followup 1 year  . Chronic systolic heart failure (HCC)    chronic systolic   . Chronotropic incompetence   . Cirrhosis, nonalcoholic ([place]) 04/21/2246  . Colonic polyp 2010   pathology not clear.   . Crohn's disease ([place])   . Degenerative joint disease   . Dermatitis 04/05/2016  . Dizziness    Positional dizziness  . Edema leg    Venous Dopplers 8/13: No DVT bilaterally  . Ejection fraction    EF 35%, echo, November, 2011  //   Is 35-40%, echo,  September 23, 2011   . Fatigue 06/25/2008   Qualifier: Diagnosis of  By: [name], MD, Leonidas Romberg Dorinda Hill   . GERD 02/21/2007   Qualifier: Diagnosis of  By: Ronnald Ramp CNA/MA, Janett Billow    . Hyperglycemia 08/08/2016  . Hyperlipemia   . Hypertension   . Hypertrophy of prostate with urinary obstruction and other lower urinary tract symptoms (LUTS)   . Hypoalbuminemia 06/16/2013  . Hypokalemia  06/18/2013  . Implantable cardioverter-defibrillator (ICD) in situ 12/09/2009   Annotation: EXPLANTED 2011   //   New Guidant ICD implanted 2011     . Iron deficiency anemia   . Ischemic cardiomyopathy    CABG 1994 CAD catherization 1996.[name] occluded vein graft to the diagnol, other grafts patent/  catherization 2006, no PCI/ nuclear.[name] october 2011.[name] extensive scar anteroseptal and apical.. no ischemia    . LBBB (left bundle branch block)   . MCI (mild cognitive impairment) 02/27/2017  . 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 Mitral regurgitation    mild.[name] echo november  2011  EF 35%...echo.. november 2009/ ef 35%.[name] echo november 2011  . Oral thrush 02/05/2015  . OSA (obstructive sleep apnea) 09/16/2011   NPSG 2013:  AHI 34/hr.    . Polymyalgia rheumatica ([place])   . Shingles   . Sinus bradycardia   . Skin cancer 08/08/2016  . Thrombocytopenia ([place]) 2008  . Thrush   . Tremor   . Ventral hernia    multiple, wears an abd binder, reports he hs been told he was at increased riisk for surgery  . Vitamin B12 deficiency   . Vitamin D deficiency 11/06/2008   Qualifier: Diagnosis of  By: Lenna Gilford MD, Deborra Medina   . Warfarin anticoagulation    Coumadin therapy for mesenteric embolus 2003    Past Surgical History:  Procedure Laterality Date  . BIV ICD GENERATOR CHANGEOUT N/A 10/23/2017   Procedure: BIV ICD GENERATOR CHANGEOUT;  Surgeon: Deboraha Sprang, MD;  Location: [facility];  Service: Cardiovascular;  Laterality: N/A;  . CATARACT EXTRACTION, BILATERAL  2014  . COLONOSCOPY N/A 04/19/2013   Procedure: COLONOSCOPY;  Surgeon: Jerene Bears, MD;  Location[facility] ENDOSCOPY;  Service: Endoscopy;  Laterality: N/A;  . CORONARY ARTERY BYPASS GRAFT  1994  . Elap w/ superior mesenteric artery embolectomy  1/03   by Dr. Jennette Banker  . ESOPHAGOGASTRODUODENOSCOPY N/A 04/18/2013   Procedure: ESOPHAGOGASTRODUODENOSCOPY (EGD);  Surgeon: Jerene Bears, MD;  Location: [facility] ENDOSCOPY;  Service: Endoscopy;  Laterality: N/A;  . implantation of biventric cardioverter-defibrillatorr     by Dr. Lovena Le in 8/06 Guidant Contak H170-explanted 2011  . implantation of guidant cognis device     model n119-2011  . laparoscopic cholecystectomy  2002  . left inguinal hernia repair with mesh  6/08   by Dr. Betsy Pries  . PROSTATE SURGERY  08/2012   by urology in [place]  . Repair of incarcerated ventral hernia and lysis of adhesions  11/03   by Dr. Betsy Pries    Current Medications: Current Meds  Medication Sig  . Ascorbic Acid (VITAMIN C) 100 MG tablet Take 100 mg by mouth daily.  [name] azelastine  (ASTELIN) 0.1 % nasal spray Place 2 sprays into both nostrils 2 (two) times daily as needed for rhinitis or allergies. Use in each nostril as directed  . carvedilol (COREG) 6.25 MG tablet TAKE 1 TABLET BY MOUTH TWICE DAILY WITH MEALS  . cholecalciferol (VITAMIN D) 1000 UNITS tablet Take 1,000 Units by mouth daily.   . cholestyramine (QUESTRAN) 4 g packet Take 1 packet (4 g total) by mouth daily.  [name] donepezil (ARICEPT) 10 MG tablet TAKE 1 TABLET BY MOUTH AT BEDTIME  . ferrous sulfate 325 (65 FE) MG tablet Take 325 mg by mouth daily with breakfast.  . furosemide (LASIX) 40 MG tablet Take 1 tablet (40 mg total) by mouth daily. Take extra Lasix 40 mg if weight increases > 3 lbs in 1 day  or if he has increased leg swelling  . hydrocortisone cream 1 % Apply 1 application topically daily as needed for itching.  [name] lisinopril (ZESTRIL) 10 MG tablet Take 1 tablet by mouth once daily  . magnesium gluconate (MAGONATE) 500 MG tablet Take 500 mg by mouth as needed (cramps).  . memantine (NAMENDA) 10 MG tablet Take 1 tablet by mouth twice daily  . oxymetazoline (AFRIN) 0.05 % nasal spray Place 1 spray into both nostrils daily as needed for congestion.  . pantoprazole (PROTONIX) 40 MG tablet TAKE 1 TABLET BY MOUTH ONCE DAILY AT  NOON  . potassium chloride SA (K-DUR,KLOR-CON) 20 MEQ tablet TAKE 1 TABLET BY MOUTH ONCE DAILY  . simvastatin (ZOCOR) 20 MG tablet TAKE 1 TABLET BY MOUTH AT BEDTIME  . vitamin B-12 (CYANOCOBALAMIN) 1000 MCG tablet Take 1,000 mcg by mouth daily.   [name] warfarin (COUMADIN) 5 MG tablet TAKE AS DIRECTED BY  COUMADIN  CLINIC     Allergies:   Patient has no known allergies.   Social History   Socioeconomic History  . Marital status: Married    Spouse name: Kennyth Lose  . Number of children: 3  . Years of education: Not on file  . Highest education level: Not on file  Occupational History  . Occupation: bull Actuary  . Occupation: farmer    Fish farm manager: RETIRED  . Occupation: logger    Tobacco Use  . Smoking status: Former Smoker    Packs/day: 1.00    Years: 20.00    Pack years: 20.00    Types: Cigarettes    Quit date: 01/31/1973    Years since quitting: 46.1  . Smokeless tobacco: Never Used  Substance and Sexual Activity  . Alcohol use: Yes    Alcohol/week: 0.0 standard drinks    Comment: occasional  . Drug use: No  . Sexual activity: Never    Comment: lives with wife, no dietary restrictions  Other Topics Concern  . Not on file  Social History Narrative   Lives in [place], [place] with wife.    Social Determinants of Health   Financial Resource Strain:   . Difficulty of Paying Living Expenses: Not on file  Food Insecurity:   . Worried About Charity fundraiser in the Last Year: Not on file  . Ran Out of Food in the Last Year: Not on file  Transportation Needs:   . Lack of Transportation (Medical): Not on file  . Lack of Transportation (Non-Medical): Not on file  Physical Activity:   . Days of Exercise per Week: Not on file  . Minutes of Exercise per Session: Not on file  Stress:   . Feeling of Stress : Not on file  Social Connections:   . Frequency of Communication with Friends and Family: Not on file  . Frequency of Social Gatherings with Friends and Family: Not on file  . Attends Religious Services: Not on file  . Active Member of Clubs or Organizations: Not on file  . Attends Archivist Meetings: Not on file  . Marital Status: Not on file     Family History: The patient's family history includes Appendicitis in his brother; Cirrhosis in his brother; Dementia in his sister; Diabetes in his son; Edema in his sister; Heart disease in his mother, sister, sister, and sister; Heart disease (age of onset: 56) in his father; Heart failure in his sister; Hyperlipidemia in an other family member; Hypertension in his brother, father, sister, and another family member; Kidney  disease in his brother; Leukemia in his brother; Myasthenia gravis in his  brother; Tremor in his sister.  ROS:   Please see the history of present illness.    All other systems reviewed and are negative.  EKGs/Labs/Other Studies Reviewed:    EKG:  EKG is not ordered today.   Recent Labs: 01/01/2019: ALT 16; BUN 12; Creatinine, Ser 1.20; Hemoglobin 12.9; Platelets 50.0 Repeated and verified X2.; Potassium 4.2; Sodium 140  Recent Lipid Panel    Component Value Date/Time   CHOL 121 01/01/2019 1011   TRIG 116.0 01/01/2019 1011   HDL 46.40 01/01/2019 1011   CHOLHDL 3 01/01/2019 1011   VLDL 23.2 01/01/2019 1011   LDLCALC 51 01/01/2019 1011   LDLDIRECT 81.6 12/30/2013 1213    Physical Exam:    VS:  BP 124/72   Pulse 63   Ht 6' (1.829 m)   Wt 189 lb (85.7 kg)   SpO2 95%   BMI 25.63 kg/m     Wt Readings from Last 3 Encounters:  03/04/19 189 lb (85.7 kg)  12/31/18 192 lb 3.2 oz (87.2 kg)  04/03/18 185 lb (83.9 kg)     GEN:  Well nourished, well developed elderly male in no acute distress HEENT: Normal NECK: No JVD; No carotid bruits LYMPHATICS: No lymphadenopathy CARDIAC: RRR, no murmurs, rubs, gallops RESPIRATORY:  Clear to auscultation without rales, wheezing or rhonchi  ABDOMEN: Soft, non-tender, non-distended MUSCULOSKELETAL:  1+ BL pretibial edema; No deformity; compression stockings in place.  There is a small area of ecchymosis on the left lower leg SKIN: Warm and dry NEUROLOGIC:  Alert and oriented x 3 PSYCHIATRIC:  Normal affect   ASSESSMENT:    1. Paroxysmal atrial fibrillation (HCC)   2. Chronic systolic heart failure ([place])   3. Coronary artery disease involving native coronary artery of native heart without angina pectoris    PLAN:    In order of problems listed above:  1. Appears to be controlled.  Continues on warfarin.  No changes recommended. 2. Appears stable on carvedilol, furosemide, and lisinopril.  Advised him to take 2 furosemide in the morning as needed for weight gain or increased edema. 3. No symptoms of angina.   Treated with a statin drug and beta-blocker.   Medication Adjustments/Labs and Tests Ordered: Current medicines are reviewed at length with the patient today.  Concerns regarding medicines are outlined above.  No orders of the defined types were placed in this encounter.  No orders of the defined types were placed in this encounter.   Patient Instructions  Medication Instructions:  Your provider recommends that you continue on your current medications as directed. Please refer to the Current Medication list given to you today.   *If you need a refill on your cardiac medications before your next appointment, please call your pharmacy*  Follow-Up: At [facility], you and your health needs are our priority.  As part of our continuing mission to provide you with exceptional heart care, we have created designated Provider Care Teams.  These Care Teams include your primary Cardiologist (physician) and Advanced Practice Providers (APPs -  Physician Assistants and Nurse Practitioners) who all work together to provide you with the care you need, when you need it. Your next appointment:   6 month(s) The format for your next appointment:   In Person Provider:   Richardson Dopp, PA-C    Signed, Sherren Mocha, MD  03/04/2019 1:19 PM    [place]

## 2019-03-07 ENCOUNTER — Other Ambulatory Visit: Payer: Self-pay | Admitting: Family Medicine

## 2019-03-07 NOTE — Telephone Encounter (Signed)
Last OV 12/14/18 Last refill Carvedilol 12/05/18 #180/0                 Simvastatin 12/11/18 #90/0 Next OV not scheduled

## 2019-03-08 NOTE — Progress Notes (Signed)
EPIC Encounter for ICM Monitoring  Patient Name: [name] is a 86 y.o. male Date: 03/08/2019 Primary Care Physican: [name], MD Primary Cardiologist:Cooper/Weaver, PA Electrophysiologist: Caryl Comes LastWeight:185lbs Right Ventricular:94% Left Ventricular:93%   AT/AF Overview Since Last Reset Dec 31, 2018        Transmission reviewed.  2/1/2021HeartLogic Heart Failure Index is6suggesting within normalthreshold range.  Prescribed:  Furosemide 40 mgTake 1 tablet (40 mg total) by mouth daily. Take extra Lasix 40 mg if weight increases > 3 lbs in 1 day or if he has increased leg swelling.  Potassium 20 mEq 1 tablet daily  Labs: 01/01/2019 Creatinine 1.20, BUN 12, Potassium 4.2, Sodium 140, GFR 57.46 09/18/2018 Creatinine 1.16, BUN 10, Potassium 4.9, Sodium 142, GFR 57-66 03/09/2018 Creatinine 1.04, BUN 8, Potassium 4.2, Sodium 144, GFR 67.91  Recommendations: None  Follow-up plan: ICM clinic phone appointment on 04/08/2019.   91 day device clinic remote transmission 05/21/2019.      Copy of ICM check sent to Dr. Caryl Comes.   3 month ICM trend: 03/04/2019    1 Year ICM trend:       Rosalene Billings, RN 03/08/2019 8:30 AM

## 2019-03-21 ENCOUNTER — Telehealth: Payer: Self-pay

## 2019-03-21 NOTE — Telephone Encounter (Signed)
LMOMED TO RESCHEDULE AFTER 11AM DUE TO NO DOCTOR IN THE  OFFICE UNTIL 11AM

## 2019-04-02 ENCOUNTER — Ambulatory Visit (INDEPENDENT_AMBULATORY_CARE_PROVIDER_SITE_OTHER): Payer: Medicare HMO | Admitting: *Deleted

## 2019-04-02 ENCOUNTER — Other Ambulatory Visit: Payer: Self-pay

## 2019-04-02 DIAGNOSIS — K55069 Acute infarction of intestine, part and extent unspecified: Secondary | ICD-10-CM | POA: Diagnosis not present

## 2019-04-02 DIAGNOSIS — Z0000 Encounter for general adult medical examination without abnormal findings: Secondary | ICD-10-CM

## 2019-04-02 DIAGNOSIS — Z5181 Encounter for therapeutic drug level monitoring: Secondary | ICD-10-CM

## 2019-04-02 LAB — POCT INR: INR: 2.2 (ref 2.0–3.0)

## 2019-04-02 NOTE — Patient Instructions (Signed)
Continue warfarin 1 tablet daily and except for 1/2 tablet on Tuesdays, Thursdays and Saturdays. Recheck in 4 weeks.  Call us with any medication changes or concerns # [phone].

## 2019-04-08 ENCOUNTER — Ambulatory Visit (INDEPENDENT_AMBULATORY_CARE_PROVIDER_SITE_OTHER): Payer: Medicare HMO

## 2019-04-08 DIAGNOSIS — I5022 Chronic systolic (congestive) heart failure: Secondary | ICD-10-CM | POA: Diagnosis not present

## 2019-04-08 DIAGNOSIS — Z95810 Presence of automatic (implantable) cardiac defibrillator: Secondary | ICD-10-CM

## 2019-04-08 NOTE — Progress Notes (Signed)
EPIC Encounter for ICM Monitoring  Patient Name: [name] is a 86 y.o. male Date: 04/08/2019 Primary Care Physican: [name], MD Primary Cardiologist:Cooper/Weaver, PA Electrophysiologist: Caryl Comes LastWeight:185lbs Right Ventricular:94% Left Ventricular:93%   AT/AF Overview Since Last Reset Dec 31, 2018 AT/AF                                        <1 % AT/AF Events                             141 Total Time in AT/AF (days)        0.4                                                            Transmission reviewed.  3/8/2021HeartLogic Heart Failure Index is 0 suggestingwithinnormalthreshold range.  Prescribed:  Furosemide 40 mgTake 1 tablet (40 mg total) by mouth daily. Take extra Lasix 40 mg if weight increases > 3 lbs in 1 day or if he has increased leg swelling.  Potassium 20 mEq 1 tablet daily  Labs: 01/01/2019 Creatinine 1.20, BUN 12, Potassium 4.2, Sodium 140, GFR 57.46 09/18/2018 Creatinine 1.16, BUN 10, Potassium 4.9, Sodium 142, GFR 57-66 03/09/2018 Creatinine 1.04, BUN 8, Potassium 4.2, Sodium 144, GFR 67.91  Recommendations: None  Follow-up plan: ICM clinic phone appointment on 05/09/2019.   91 day device clinic remote transmission 05/21/2019.      Copy of ICM check sent to Dr. Caryl Comes.   3 Month Trend    8 Day Data Trend          Rosalene Billings, RN 04/08/2019 8:20 AM

## 2019-04-19 ENCOUNTER — Telehealth: Payer: Self-pay

## 2019-04-19 NOTE — Telephone Encounter (Signed)
Left message for pts wife to call back regarding scheduling pts repeat [id number] follow up [place]

## 2019-04-19 NOTE — Telephone Encounter (Signed)
-----   Message from Algernon Huxley, RN sent at 10/18/2018  9:59 AM EDT ----- Regarding: [place] Pt needs repeat US of abd

## 2019-04-28 ENCOUNTER — Other Ambulatory Visit: Payer: Self-pay | Admitting: Family Medicine

## 2019-04-28 ENCOUNTER — Other Ambulatory Visit: Payer: Self-pay | Admitting: Cardiology

## 2019-04-30 ENCOUNTER — Ambulatory Visit (INDEPENDENT_AMBULATORY_CARE_PROVIDER_SITE_OTHER): Payer: Medicare HMO | Admitting: *Deleted

## 2019-04-30 ENCOUNTER — Other Ambulatory Visit: Payer: Self-pay

## 2019-04-30 DIAGNOSIS — Z5181 Encounter for therapeutic drug level monitoring: Secondary | ICD-10-CM | POA: Diagnosis not present

## 2019-04-30 DIAGNOSIS — Z0000 Encounter for general adult medical examination without abnormal findings: Secondary | ICD-10-CM

## 2019-04-30 DIAGNOSIS — K55069 Acute infarction of intestine, part and extent unspecified: Secondary | ICD-10-CM

## 2019-04-30 LAB — POCT INR: INR: 1.8 — AB (ref 2.0–3.0)

## 2019-04-30 NOTE — Patient Instructions (Signed)
Take warfarin 1 tablet today then resume 1 tablet daily and except for 1/2 tablet on Tuesdays, Thursdays and Saturdays. Recheck in 3 weeks.  Call us with any medication changes or concerns # [phone].

## 2019-05-05 ENCOUNTER — Emergency Department (HOSPITAL_BASED_OUTPATIENT_CLINIC_OR_DEPARTMENT_OTHER)
Admission: EM | Admit: 2019-05-05 | Discharge: 2019-05-05 | Disposition: A | Discharge status: Home / Self Care | Payer: Medicare HMO | Attending: Emergency Medicine | Admitting: Emergency Medicine

## 2019-05-05 ENCOUNTER — Encounter (HOSPITAL_BASED_OUTPATIENT_CLINIC_OR_DEPARTMENT_OTHER): Payer: Self-pay | Admitting: *Deleted

## 2019-05-05 ENCOUNTER — Emergency Department (HOSPITAL_BASED_OUTPATIENT_CLINIC_OR_DEPARTMENT_OTHER): Payer: Medicare HMO

## 2019-05-05 ENCOUNTER — Other Ambulatory Visit: Payer: Self-pay

## 2019-05-05 DIAGNOSIS — M542 Cervicalgia: Secondary | ICD-10-CM | POA: Diagnosis not present

## 2019-05-05 DIAGNOSIS — Y999 Unspecified external cause status: Secondary | ICD-10-CM | POA: Diagnosis not present

## 2019-05-05 DIAGNOSIS — Z95810 Presence of automatic (implantable) cardiac defibrillator: Secondary | ICD-10-CM | POA: Diagnosis not present

## 2019-05-05 DIAGNOSIS — Z87891 Personal history of nicotine dependence: Secondary | ICD-10-CM | POA: Insufficient documentation

## 2019-05-05 DIAGNOSIS — Z7901 Long term (current) use of anticoagulants: Secondary | ICD-10-CM | POA: Diagnosis not present

## 2019-05-05 DIAGNOSIS — W1789XA Other fall from one level to another, initial encounter: Secondary | ICD-10-CM | POA: Insufficient documentation

## 2019-05-05 DIAGNOSIS — Z79899 Other long term (current) drug therapy: Secondary | ICD-10-CM | POA: Insufficient documentation

## 2019-05-05 DIAGNOSIS — F0390 Unspecified dementia without behavioral disturbance: Secondary | ICD-10-CM | POA: Diagnosis not present

## 2019-05-05 DIAGNOSIS — I5022 Chronic systolic (congestive) heart failure: Secondary | ICD-10-CM | POA: Diagnosis not present

## 2019-05-05 DIAGNOSIS — I110 Hypertensive heart disease with heart failure: Secondary | ICD-10-CM | POA: Diagnosis not present

## 2019-05-05 DIAGNOSIS — Y929 Unspecified place or not applicable: Secondary | ICD-10-CM | POA: Insufficient documentation

## 2019-05-05 DIAGNOSIS — Z955 Presence of coronary angioplasty implant and graft: Secondary | ICD-10-CM | POA: Insufficient documentation

## 2019-05-05 DIAGNOSIS — R69 Illness, unspecified: Secondary | ICD-10-CM | POA: Diagnosis not present

## 2019-05-05 DIAGNOSIS — Y9389 Activity, other specified: Secondary | ICD-10-CM | POA: Diagnosis not present

## 2019-05-05 DIAGNOSIS — R519 Headache, unspecified: Secondary | ICD-10-CM | POA: Diagnosis not present

## 2019-05-05 DIAGNOSIS — S0990XA Unspecified injury of head, initial encounter: Secondary | ICD-10-CM | POA: Diagnosis not present

## 2019-05-05 DIAGNOSIS — S0001XA Abrasion of scalp, initial encounter: Secondary | ICD-10-CM | POA: Diagnosis not present

## 2019-05-05 DIAGNOSIS — W19XXXA Unspecified fall, initial encounter: Secondary | ICD-10-CM

## 2019-05-05 DIAGNOSIS — I2510 Atherosclerotic heart disease of native coronary artery without angina pectoris: Secondary | ICD-10-CM | POA: Diagnosis not present

## 2019-05-05 NOTE — ED Provider Notes (Signed)
[facility] EMERGENCY DEPARTMENT Provider Note   CSN: [id number] Arrival date & time: 05/05/19  1920     History Chief Complaint  Patient presents with  . Fall    WILLIAN DONSON is a 86 y.o. male.  The history is provided by the patient, a relative and medical records. No language interpreter was used.  Fall   TERMAINE ROUPP is a 86 y.o. male who presents to the Emergency Department complaining of fall. He presents the emergency department for evaluation following a fall off of the dog box. History is provided by the patient and a relative. He was standing on a wooden box when he lost his balance and fell off, hitting his head. No loss of consciousness. He has no complaints. He takes Coumadin, last check was on March 30 and was 1.8. There is no increase in his Coumadin dosing at that time. No reports of recent illnesses. He reports feeling well at this time. No difficulty with walking.    Past Medical History:  Diagnosis Date  . AAA (abdominal aortic aneurysm) ([place])   . Abdominal pain 04/14/2011  . Acute bronchitis 09/02/2013  . Acute vascular insufficiency of intestine (HCC)    Mesenteric embolus...surgery 2003  . Allergic rhinitis 09/09/2013  . Anxiety   . Aortic atherosclerosis ([place]) 11/10/2015  . Atrial fibrillation (HCC)    paroxysmal  . Back pain 08/08/2016  . Biventricular implantable cardiac defibrillator in situ    GDT CONTAK H170-MADIT-CRT-EXPLANTED 2011 implanted defibrillator- Guidant cognis, model n119-2001 Dr. Caryl Comes (11/28/2009)  . Breast tenderness    Spironolactone was stopped and eplerinone started. Patient feels better  . CAD (coronary artery disease)    CABG 1994  /   catheterization 1996, occluded vein graft to the diagonal, other grafts patent  /   catheterization 2006, no PCI  /  nuclear, October, 2011, extensive scar anteroseptal and apical, no ischemia  . Carotid artery disease ([place])    Doppler, February,  2012, 0-39% bilateral, recommended  followup 1 year  . Chronic systolic heart failure (HCC)    chronic systolic   . Chronotropic incompetence   . Cirrhosis, nonalcoholic ([place]) 7/61/6073  . Colonic polyp 2010   pathology not clear.   . Crohn's disease ([place])   . Degenerative joint disease   . Dermatitis 04/05/2016  . Dizziness    Positional dizziness  . Edema leg    Venous Dopplers 8/13: No DVT bilaterally  . Ejection fraction    EF 35%, echo, November, 2011  //   Is 35-40%, echo,  September 23, 2011   . Fatigue 06/25/2008   Qualifier: Diagnosis of  By: [name], MD, Leonidas Romberg Dorinda Hill   . GERD 02/21/2007   Qualifier: Diagnosis of  By: Ronnald Ramp CNA/MA, Janett Billow    . Hyperglycemia 08/08/2016  . Hyperlipemia   . Hypertension   . Hypertrophy of prostate with urinary obstruction and other lower urinary tract symptoms (LUTS)   . Hypoalbuminemia 06/16/2013  . Hypokalemia 06/18/2013  . Implantable cardioverter-defibrillator (ICD) in situ 12/09/2009   Annotation: EXPLANTED 2011   //   New Guidant ICD implanted 2011     . Iron deficiency anemia   . Ischemic cardiomyopathy    CABG 1994 CAD catherization 1996.[name] occluded vein graft to the diagnol, other grafts patent/  catherization 2006, no PCI/ nuclear.[name] october 2011.[name] extensive scar anteroseptal and apical.. no ischemia    . LBBB (left bundle branch block)   . MCI (mild cognitive impairment) 02/27/2017  .  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 Mitral regurgitation    mild.[name] echo november 2011  EF 35%...echo.. november 2009/ ef 35%.[name] echo november 2011  . Oral thrush 02/05/2015  . OSA (obstructive sleep apnea) 09/16/2011   NPSG 2013:  AHI 34/hr.    . Polymyalgia rheumatica ([place])   . Shingles   . Sinus bradycardia   . Skin cancer 08/08/2016  . Thrombocytopenia ([place]) 2008  . Thrush   . Tremor   . Ventral hernia    multiple, wears an abd binder, reports he hs been told he was at increased riisk for surgery  . Vitamin B12 deficiency   . Vitamin D deficiency 11/06/2008   Qualifier: Diagnosis  of  By: Lenna Gilford MD, Deborra Medina   . Warfarin anticoagulation    Coumadin therapy for mesenteric embolus 2003    Patient Active Problem List   Diagnosis Date Noted  . Dementia ([place]) 12/16/2018  . Paroxysmal atrial fibrillation ([place]) 04/02/2018  . Encounter for therapeutic drug monitoring 03/08/2017  . Cognitive impairment 02/27/2017  . Preventative health care 10/17/2016  . Skin cancer 08/08/2016  . Hyperglycemia 08/08/2016  . Back pain 08/08/2016  . Dermatitis 04/05/2016  . Aortic atherosclerosis ([place]) 11/10/2015  . Cirrhosis, nonalcoholic ([place]) 96/78/9381  . Allergic rhinitis 09/09/2013  . Hypokalemia 06/18/2013  . Hypoalbuminemia 06/16/2013  . Left ankle pain 06/07/2013  . Thrombocytopenia ([place]) 11/30/2011  . Ejection fraction   . Edema leg   . OSA (obstructive sleep apnea) 09/16/2011  . Abdominal pain 04/14/2011  . CAD (coronary artery disease)   . Hx of CABG   . Hypertension   . Chronic systolic heart failure ([place])   . Mitral regurgitation   . Chronotropic incompetence   . Hyperlipemia   . Crohn's disease of large intestine ([place])   . Ventral hernia   . Tremor   . Warfarin anticoagulation   . Carotid artery disease ([place])   . 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 Implantable cardioverter-defibrillator (ICD) in situ 12/09/2009  . HYPERTROPHY PROSTATE W/UR OBST & OTH LUTS 03/31/2009  . Vitamin B 12 deficiency 11/06/2008  . Vitamin D deficiency 11/06/2008  . Cardiomyopathy, ischemic 09/15/2008  . LBBB (left bundle branch block) 06/25/2008  . Fatigue 06/25/2008  . COLONIC POLYPS 03/23/2007  . POLYMYALGIA RHEUMATICA 03/23/2007  . GERD 02/21/2007    Past Surgical History:  Procedure Laterality Date  . BIV ICD GENERATOR CHANGEOUT N/A 10/23/2017   Procedure: BIV ICD GENERATOR CHANGEOUT;  Surgeon: Deboraha Sprang, MD;  Location: [facility] LAB;  Service: Cardiovascular;  Laterality: N/A;  . CATARACT EXTRACTION, BILATERAL  2014  . COLONOSCOPY N/A 04/19/2013    Procedure: COLONOSCOPY;  Surgeon: Jerene Bears, MD;  Location: [facility] ENDOSCOPY;  Service: Endoscopy;  Laterality: N/A;  . CORONARY ARTERY BYPASS GRAFT  1994  . Elap w/ superior mesenteric artery embolectomy  1/03   by Dr. Jennette Banker  . ESOPHAGOGASTRODUODENOSCOPY N/A 04/18/2013   Procedure: ESOPHAGOGASTRODUODENOSCOPY (EGD);  Surgeon: Jerene Bears, MD;  Location: [facility] ENDOSCOPY;  Service: Endoscopy;  Laterality: N/A;  . implantation of biventric cardioverter-defibrillatorr     by Dr. Lovena Le in 8/06 Guidant Contak H170-explanted 2011  . implantation of guidant cognis device     model n119-2011  . laparoscopic cholecystectomy  2002  . left inguinal hernia repair with mesh  6/08   by Dr. Betsy Pries  . PROSTATE SURGERY  08/2012   by urology in [place]  . Repair of incarcerated ventral hernia and lysis of adhesions  11/03  by Dr. Betsy Pries       Family History  Problem Relation Age of Onset  . Heart disease Mother   . Tremor Sister   . Heart failure Sister   . Heart disease Father 16  . Hypertension Father   . Appendicitis Brother        ruptured  . Diabetes Son   . Heart disease Sister   . Hypertension Sister   . Dementia Sister   . Heart disease Sister   . Hypertension Brother   . Kidney disease Brother   . Myasthenia gravis Brother   . Cirrhosis Brother   . Leukemia Brother   . Hypertension Other   . Hyperlipidemia Other   . Heart disease Sister   . Edema Sister        pedal edema    Social History   Tobacco Use  . Smoking status: Former Smoker    Packs/day: 1.00    Years: 20.00    Pack years: 20.00    Types: Cigarettes    Quit date: 01/31/1973    Years since quitting: 46.2  . Smokeless tobacco: Never Used  Substance Use Topics  . Alcohol use: Yes    Alcohol/week: 0.0 standard drinks    Comment: occasional  . Drug use: No    Home Medications Prior to Admission medications   Medication Sig Start Date End Date Taking? Authorizing Provider  Ascorbic Acid (VITAMIN C) 100  MG tablet Take 100 mg by mouth daily.    [provider]  azelastine (ASTELIN) 0.1 % nasal spray Place 2 sprays into both nostrils 2 (two) times daily as needed for rhinitis or allergies. Use in each nostril as directed 05/15/18   [name], MD  carvedilol (COREG) 6.25 MG tablet TAKE 1 TABLET BY MOUTH TWICE DAILY WITH MEALS 03/07/19   [name], MD  cholecalciferol (VITAMIN D) 1000 UNITS tablet Take 1,000 Units by mouth daily.     [provider]  cholestyramine (QUESTRAN) 4 g packet Take 1 packet (4 g total) by mouth daily. 09/04/18   Pyrtle, Lajuan Lines, MD  donepezil (ARICEPT) 10 MG tablet TAKE 1 TABLET BY MOUTH AT BEDTIME 02/18/19   [name], MD  ferrous sulfate 325 (65 FE) MG tablet Take 325 mg by mouth daily with breakfast.    [provider]  furosemide (LASIX) 40 MG tablet Take 1 tablet (40 mg total) by mouth daily. Take extra Lasix 40 mg if weight increases > 3 lbs in 1 day or if he has increased leg swelling 09/14/18 09/14/19  Richardson Dopp T, PA-C  hydrocortisone cream 1 % Apply 1 application topically daily as needed for itching.    [provider]  lisinopril (ZESTRIL) 10 MG tablet Take 1 tablet by mouth once daily 12/31/18   Dorothy Spark, MD  magnesium gluconate (MAGONATE) 500 MG tablet Take 500 mg by mouth as needed (cramps).    [provider]  memantine (NAMENDA) 10 MG tablet Take 1 tablet by mouth twice daily 04/29/19   [name], MD  oxymetazoline (AFRIN) 0.05 % nasal spray Place 1 spray into both nostrils daily as needed for congestion.    [provider]  pantoprazole (PROTONIX) 40 MG tablet TAKE 1 TABLET BY MOUTH ONCE DAILY AT NOON 04/30/19   Sherren Mocha, MD  potassium chloride SA (K-DUR,KLOR-CON) 20 MEQ tablet TAKE 1 TABLET BY MOUTH ONCE DAILY 03/05/18   Dorothy Spark, MD  simvastatin (ZOCOR) 20 MG  tablet TAKE 1 TABLET BY MOUTH AT BEDTIME 03/07/19   [name], MD  vitamin B-12 (CYANOCOBALAMIN) 1000  MCG tablet Take 1,000 mcg by mouth daily.     [provider]  warfarin (COUMADIN) 5 MG tablet TAKE AS DIRECTED BY  COUMADIN  CLINIC 01/15/19   Dorothy Spark, MD    Allergies    Patient has no known allergies.  Review of Systems   Review of Systems  All other systems reviewed and are negative.   Physical Exam Updated Vital Signs BP 117/70 (BP Location: Right Arm)   Pulse 60   Temp 99.2 F (37.3 C) (Oral)   Resp 16   Ht 6' (1.829 m)   Wt 81.6 kg   SpO2 94%   BMI 24.41 kg/m   Physical Exam Vitals and nursing note reviewed.  Constitutional:      Appearance: He is well-developed.  HENT:     Head: Normocephalic.     Comments: Abrasion to the occipital scalp with mild local swelling. Cardiovascular:     Rate and Rhythm: Normal rate and regular rhythm.     Heart sounds: No murmur.  Pulmonary:     Effort: Pulmonary effort is normal. No respiratory distress.     Breath sounds: Normal breath sounds.  Abdominal:     Palpations: Abdomen is soft.     Tenderness: There is no abdominal tenderness. There is no guarding or rebound.  Musculoskeletal:        General: No tenderness.     Comments: 1 to 2+ pitting edema to bilateral lower extremities  Skin:    General: Skin is warm and dry.  Neurological:     Mental Status: He is alert.     Comments: Hard of hearing. Oriented to person, place in recent events. Five out of five strength in all four extremities  Psychiatric:        Behavior: Behavior normal.     ED Results / Procedures / Treatments   Labs (all labs ordered are listed, but only abnormal results are displayed) Labs Reviewed - No data to display  EKG None  Radiology CT Head Wo Contrast  Result Date: 05/05/2019 CLINICAL DATA:  Pain status post fall. EXAM: CT HEAD WITHOUT CONTRAST CT CERVICAL SPINE WITHOUT CONTRAST TECHNIQUE: Multidetector CT imaging of the head and cervical spine was performed following the standard protocol without intravenous  contrast. Multiplanar CT image reconstructions of the cervical spine were also generated. COMPARISON:  CT head dated April 18, 2013. FINDINGS: CT HEAD FINDINGS Brain: No evidence of acute infarction, hemorrhage, hydrocephalus, extra-axial collection or mass lesion/mass effect. Atrophy and chronic microvascular ischemic changes are noted Vascular: No hyperdense vessel or unexpected calcification. Skull: Normal. Negative for fracture or focal lesion. Sinuses/Orbits: No acute finding. Other: None. CT CERVICAL SPINE FINDINGS Alignment: Normal. Skull base and vertebrae: No acute fracture. No primary bone lesion or focal pathologic process. Soft tissues and spinal canal: No prevertebral fluid or swelling. No visible canal hematoma. Disc levels: Mild multilevel degenerative changes are noted throughout the cervical spine. Upper chest: Negative. Other: None IMPRESSION: 1. No evidence of acute intracranial pathology. 2. No evidence of acute fracture or malalignment in the cervical spine. Electronically Signed   By: Constance Holster M.D.   On: 05/05/2019 20:17   CT Cervical Spine Wo Contrast  Result Date: 05/05/2019 CLINICAL DATA:  Pain status post fall. EXAM: CT HEAD WITHOUT CONTRAST CT CERVICAL SPINE WITHOUT CONTRAST TECHNIQUE: Multidetector CT imaging of the head and  cervical spine was performed following the standard protocol without intravenous contrast. Multiplanar CT image reconstructions of the cervical spine were also generated. COMPARISON:  CT head dated April 18, 2013. FINDINGS: CT HEAD FINDINGS Brain: No evidence of acute infarction, hemorrhage, hydrocephalus, extra-axial collection or mass lesion/mass effect. Atrophy and chronic microvascular ischemic changes are noted Vascular: No hyperdense vessel or unexpected calcification. Skull: Normal. Negative for fracture or focal lesion. Sinuses/Orbits: No acute finding. Other: None. CT CERVICAL SPINE FINDINGS Alignment: Normal. Skull base and vertebrae: No acute  fracture. No primary bone lesion or focal pathologic process. Soft tissues and spinal canal: No prevertebral fluid or swelling. No visible canal hematoma. Disc levels: Mild multilevel degenerative changes are noted throughout the cervical spine. Upper chest: Negative. Other: None IMPRESSION: 1. No evidence of acute intracranial pathology. 2. No evidence of acute fracture or malalignment in the cervical spine. Electronically Signed   By: Constance Holster M.D.   On: 05/05/2019 20:17    Procedures Procedures (including critical care time)  Medications Ordered in ED Medications - No data to display  ED Course  I have reviewed the triage vital signs and the nursing notes.  Pertinent labs & imaging results that were available during my care of the patient were reviewed by me and considered in my medical decision making (see chart for details).    MDM Rules/Calculators/A&P                     Patient here for evaluation of injuries following a mechanical fall. He is on Coumadin and last INR was at 1.8 several days ago, no recent increase in dosing. No recent illnesses. Imaging is negative for serious intracranial abnormality. Discussed with patient and family member home care following head injury on Coumadin. Discussed importance of return precautions if he has any change in his mental status as he may require repeat imaging.  Final Clinical Impression(s) / ED Diagnoses Final diagnoses:  Fall, initial encounter  Abrasion of scalp, initial encounter  Injury of head, initial encounter    Rx / DC Orders ED Discharge Orders    None       Quintella Reichert, MD 05/05/19 2116

## 2019-05-05 NOTE — ED Triage Notes (Addendum)
Pt reports he was standing on a "dog box" and fell off and landed on his head. No LOC. Abrasion noted to top of head. Pt is on coumadin. Alert and oriented at present. C-collar applied

## 2019-05-09 ENCOUNTER — Ambulatory Visit (INDEPENDENT_AMBULATORY_CARE_PROVIDER_SITE_OTHER): Payer: Medicare HMO

## 2019-05-09 DIAGNOSIS — Z95810 Presence of automatic (implantable) cardiac defibrillator: Secondary | ICD-10-CM | POA: Diagnosis not present

## 2019-05-09 DIAGNOSIS — I5022 Chronic systolic (congestive) heart failure: Secondary | ICD-10-CM

## 2019-05-10 NOTE — Progress Notes (Signed)
EPIC Encounter for ICM Monitoring  Patient Name: FATEH KINDLE is a 86 y.o. male Date: 05/10/2019 Primary Care Physican: [name], MD Primary Cardiologist:Cooper/Weaver, PA Electrophysiologist: Caryl Comes LastWeight:185lbs Right Ventricular:95% Left Ventricular:94%  Since Last Reset Dec 31, 2018 Atrial Arrhythmia  AT/AF  <1 %  AT/AF Events 148  Total Time in AT/AF (days)  0.5  Spoke with wife.  Patient has not had any fluid symptoms.   Patient was outside planting a garden.  He fell earlier this week and had ER visit because he hit his head but doing okay.  4/7/2021[employer]Logic Heart Failure Index is 0 suggestingwithinnormalthreshold range.  Prescribed:  Furosemide 40 mgTake 1 tablet (40 mg total) by mouth daily. Take extra Lasix 40 mg if weight increases > 3 lbs in 1 day or if he has increased leg swelling.  Potassium 20 mEq 1 tablet daily  Labs: 01/01/2019 Creatinine 1.20, BUN 12, Potassium 4.2, Sodium 140, GFR 57.46 09/18/2018 Creatinine 1.16, BUN 10, Potassium 4.9, Sodium 142, GFR 57-66 03/09/2018 Creatinine 1.04, BUN 8, Potassium 4.2, Sodium 144, GFR 67.91  Recommendations:No changes and encouraged to call if experiencing any fluid symptoms.  Follow-up plan: ICM clinic phone appointment on5/11/2019. 91 day device clinic remote transmission 05/21/2019.   Copy of ICM check sent to [place].   3 Month Trend    8 Day Data Trend          Rosalene Billings, RN 05/10/2019 1:46 PM

## 2019-05-13 NOTE — Telephone Encounter (Signed)
Left message on machine to call back  

## 2019-05-20 ENCOUNTER — Other Ambulatory Visit: Payer: Self-pay | Admitting: Family Medicine

## 2019-05-21 ENCOUNTER — Ambulatory Visit (INDEPENDENT_AMBULATORY_CARE_PROVIDER_SITE_OTHER): Payer: Medicare HMO | Admitting: *Deleted

## 2019-05-21 ENCOUNTER — Other Ambulatory Visit: Payer: Self-pay | Admitting: Cardiology

## 2019-05-21 ENCOUNTER — Other Ambulatory Visit: Payer: Self-pay

## 2019-05-21 DIAGNOSIS — K55069 Acute infarction of intestine, part and extent unspecified: Secondary | ICD-10-CM

## 2019-05-21 DIAGNOSIS — Z5181 Encounter for therapeutic drug level monitoring: Secondary | ICD-10-CM

## 2019-05-21 DIAGNOSIS — Z0000 Encounter for general adult medical examination without abnormal findings: Secondary | ICD-10-CM

## 2019-05-21 DIAGNOSIS — I480 Paroxysmal atrial fibrillation: Secondary | ICD-10-CM

## 2019-05-21 LAB — CUP PACEART REMOTE DEVICE CHECK
Battery Remaining Longevity: 102 mo
Battery Remaining Percentage: 100 %
Brady Statistic RA Percent Paced: 92 %
Brady Statistic RV Percent Paced: 95 %
Date Time Interrogation Session: 20210420012100
HighPow Impedance: 37 Ohm
Implantable Lead Implant Date: 20060808
Implantable Lead Implant Date: 20060808
Implantable Lead Implant Date: 20060911
Implantable Lead Location: 753858
Implantable Lead Location: 753859
Implantable Lead Location: 753860
Implantable Lead Model: 158
Implantable Lead Model: 4087
Implantable Lead Serial Number: 165392
Implantable Lead Serial Number: 261368
Implantable Pulse Generator Implant Date: 20190923
Lead Channel Impedance Value: 367 Ohm
Lead Channel Impedance Value: 426 Ohm
Lead Channel Impedance Value: 462 Ohm
Lead Channel Setting Pacing Amplitude: 2 V
Lead Channel Setting Pacing Amplitude: 2.3 V
Lead Channel Setting Pacing Amplitude: 2.4 V
Lead Channel Setting Pacing Pulse Width: 0.4 ms
Lead Channel Setting Pacing Pulse Width: 0.8 ms
Lead Channel Setting Sensing Sensitivity: 0.5 mV
Lead Channel Setting Sensing Sensitivity: 1 mV
Pulse Gen Serial Number: 134188

## 2019-05-21 LAB — POCT INR: INR: 1.2 — AB (ref 2.0–3.0)

## 2019-05-21 MED ORDER — WARFARIN SODIUM 5 MG PO TABS: ORAL_TABLET | ORAL | 0 refills | Status: DC

## 2019-05-21 NOTE — Patient Instructions (Signed)
Take warfarin 1 1/2 tablets today and tomorrow the increase dose to 1 tablet daily and except for 1/2 tablet on Tuesdays and Saturdays. Recheck in 2 weeks.  Call us with any medication changes or concerns # [phone].

## 2019-05-22 NOTE — Progress Notes (Signed)
ICD Remote  

## 2019-05-28 ENCOUNTER — Other Ambulatory Visit: Payer: Self-pay

## 2019-05-30 ENCOUNTER — Other Ambulatory Visit: Payer: Self-pay | Admitting: Family Medicine

## 2019-05-30 ENCOUNTER — Other Ambulatory Visit: Payer: Self-pay | Admitting: Cardiology

## 2019-06-10 ENCOUNTER — Ambulatory Visit (INDEPENDENT_AMBULATORY_CARE_PROVIDER_SITE_OTHER): Payer: Medicare HMO

## 2019-06-10 DIAGNOSIS — Z95810 Presence of automatic (implantable) cardiac defibrillator: Secondary | ICD-10-CM

## 2019-06-10 DIAGNOSIS — I5022 Chronic systolic (congestive) heart failure: Secondary | ICD-10-CM | POA: Diagnosis not present

## 2019-06-11 ENCOUNTER — Other Ambulatory Visit: Payer: Self-pay

## 2019-06-11 ENCOUNTER — Ambulatory Visit (INDEPENDENT_AMBULATORY_CARE_PROVIDER_SITE_OTHER): Payer: Medicare HMO | Admitting: *Deleted

## 2019-06-11 DIAGNOSIS — Z5181 Encounter for therapeutic drug level monitoring: Secondary | ICD-10-CM

## 2019-06-11 DIAGNOSIS — K55069 Acute infarction of intestine, part and extent unspecified: Secondary | ICD-10-CM

## 2019-06-11 DIAGNOSIS — Z0000 Encounter for general adult medical examination without abnormal findings: Secondary | ICD-10-CM

## 2019-06-11 LAB — POCT INR: INR: 3 (ref 2.0–3.0)

## 2019-06-11 NOTE — Patient Instructions (Signed)
Continue warfarin 1 tablet daily and except for 1/2 tablet on Tuesdays and Saturdays. Recheck in 4 weeks.  Increase greens by 1 serving a week Call us with any medication changes or concerns # [phone].

## 2019-06-12 ENCOUNTER — Telehealth: Payer: Self-pay

## 2019-06-12 NOTE — Telephone Encounter (Signed)
Remote ICM transmission received.  Attempted call to wife/patient regarding ICM remote transmission and left detailed message per DPR.  Advised to return call for any fluid symptoms or questions.

## 2019-06-12 NOTE — Progress Notes (Signed)
EPIC Encounter for ICM Monitoring  Patient Name: [name] is a 86 y.o. male Date: 06/12/2019 Primary Care Physican: [name], MD Primary Cardiologist:Cooper/Weaver, PA Electrophysiologist: Caryl Comes LastWeight:185lbs Right Ventricular:95% Left Ventricular:94%    Total Time in AT/AF  0 hours  Attempted call to wife and unable to reach.  Left detailed message per DPR regarding transmission. Transmission reviewed.   4/7/2021HeartLogic Heart Failure Index is0suggestingwithinnormalthreshold range.  Prescribed:  Furosemide 40 mgTake 1 tablet (40 mg total) by mouth daily. Take extra Lasix 40 mg if weight increases > 3 lbs in 1 day or if he has increased leg swelling.  Potassium 20 mEq 1 tablet daily  Labs: 01/01/2019 Creatinine 1.20, BUN 12, Potassium 4.2, Sodium 140, GFR 57.46 09/18/2018 Creatinine 1.16, BUN 10, Potassium 4.9, Sodium 142, GFR 57-66 03/09/2018 Creatinine 1.04, BUN 8, Potassium 4.2, Sodium 144, GFR 67.91  Recommendations:Left voice mail with ICM number and encouraged to call if experiencing any fluid symptoms.  Follow-up plan: ICM clinic phone appointment on6/14/2021. 91 day device clinic remote transmission 08/20/2019.   Copy of ICM check sent to [place].  3 Month Trend    8 Day Data Trend          Rosalene Billings, RN 06/12/2019 1:00 PM

## 2019-06-13 ENCOUNTER — Other Ambulatory Visit: Payer: Self-pay | Admitting: Family Medicine

## 2019-06-30 ENCOUNTER — Other Ambulatory Visit: Payer: Self-pay | Admitting: Family Medicine

## 2019-07-15 ENCOUNTER — Ambulatory Visit (INDEPENDENT_AMBULATORY_CARE_PROVIDER_SITE_OTHER): Payer: Medicare HMO

## 2019-07-15 DIAGNOSIS — I5022 Chronic systolic (congestive) heart failure: Secondary | ICD-10-CM | POA: Diagnosis not present

## 2019-07-15 DIAGNOSIS — Z95810 Presence of automatic (implantable) cardiac defibrillator: Secondary | ICD-10-CM | POA: Diagnosis not present

## 2019-07-16 ENCOUNTER — Ambulatory Visit (INDEPENDENT_AMBULATORY_CARE_PROVIDER_SITE_OTHER): Payer: Medicare HMO | Admitting: Pharmacist

## 2019-07-16 ENCOUNTER — Other Ambulatory Visit: Payer: Self-pay

## 2019-07-16 DIAGNOSIS — Z5181 Encounter for therapeutic drug level monitoring: Secondary | ICD-10-CM | POA: Diagnosis not present

## 2019-07-16 DIAGNOSIS — Z7901 Long term (current) use of anticoagulants: Secondary | ICD-10-CM | POA: Diagnosis not present

## 2019-07-16 DIAGNOSIS — K55069 Acute infarction of intestine, part and extent unspecified: Secondary | ICD-10-CM | POA: Diagnosis not present

## 2019-07-16 DIAGNOSIS — Z0000 Encounter for general adult medical examination without abnormal findings: Secondary | ICD-10-CM

## 2019-07-16 DIAGNOSIS — I480 Paroxysmal atrial fibrillation: Secondary | ICD-10-CM

## 2019-07-16 LAB — POCT INR: INR: 2.2 (ref 2.0–3.0)

## 2019-07-16 NOTE — Patient Instructions (Signed)
Description   Continue warfarin 1 tablet daily and except for 1/2 tablet on Tuesdays and Saturdays. Recheck in 4 weeks.  Increase greens by 1 serving a week Call us with any medication changes or concerns # [phone].

## 2019-07-17 NOTE — Progress Notes (Signed)
EPIC Encounter for ICM Monitoring  Patient Name: WINNIE BARSKY is a 86 y.o. male Date: 07/17/2019 Primary Care Physican: [name], MD Primary Cardiologist:Cooper/Weaver, PA Electrophysiologist: Caryl Comes LastWeight:185lbs Right Ventricular:94% Left Ventricular:93%    Total Time in AT/AF 0 hours  Transmission reviewed.   6/13/2021HeartLogic Heart Failure Index is 1suggestingfluid levels are withinnormalthreshold range.  Prescribed:  Furosemide 40 mgTake 1 tablet (40 mg total) by mouth daily. Take extra Lasix 40 mg if weight increases > 3 lbs in 1 day or if he has increased leg swelling.  Potassium 20 mEq 1 tablet daily  Labs: 01/01/2019 Creatinine 1.20, BUN 12, Potassium 4.2, Sodium 140, GFR 57.46 09/18/2018 Creatinine 1.16, BUN 10, Potassium 4.9, Sodium 142, GFR 57-66 03/09/2018 Creatinine 1.04, BUN 8, Potassium 4.2, Sodium 144, GFR 67.91  Recommendations: None  Follow-up plan: ICM clinic phone appointment on7/21/2021. 91 day device clinic remote transmission 08/20/2019.   Copy of ICM check sent to [place].  3 month ICM trend: 07/14/2019         Rosalene Billings, RN 07/17/2019 2:33 PM

## 2019-08-17 ENCOUNTER — Encounter (HOSPITAL_COMMUNITY): Payer: Self-pay | Admitting: *Deleted

## 2019-08-17 ENCOUNTER — Emergency Department (HOSPITAL_COMMUNITY)
Admission: EM | Admit: 2019-08-17 | Discharge: 2019-08-18 | Disposition: A | Discharge status: Home / Self Care | Payer: Medicare HMO | Attending: Emergency Medicine | Admitting: Emergency Medicine

## 2019-08-17 ENCOUNTER — Other Ambulatory Visit: Payer: Self-pay

## 2019-08-17 DIAGNOSIS — Z85828 Personal history of other malignant neoplasm of skin: Secondary | ICD-10-CM | POA: Diagnosis not present

## 2019-08-17 DIAGNOSIS — R310 Gross hematuria: Secondary | ICD-10-CM | POA: Diagnosis not present

## 2019-08-17 DIAGNOSIS — N4889 Other specified disorders of penis: Secondary | ICD-10-CM | POA: Diagnosis present

## 2019-08-17 DIAGNOSIS — Z87891 Personal history of nicotine dependence: Secondary | ICD-10-CM | POA: Insufficient documentation

## 2019-08-17 DIAGNOSIS — Z7901 Long term (current) use of anticoagulants: Secondary | ICD-10-CM | POA: Diagnosis not present

## 2019-08-17 DIAGNOSIS — I10 Essential (primary) hypertension: Secondary | ICD-10-CM | POA: Diagnosis not present

## 2019-08-17 DIAGNOSIS — I5022 Chronic systolic (congestive) heart failure: Secondary | ICD-10-CM | POA: Insufficient documentation

## 2019-08-17 DIAGNOSIS — Z79899 Other long term (current) drug therapy: Secondary | ICD-10-CM | POA: Insufficient documentation

## 2019-08-17 DIAGNOSIS — R319 Hematuria, unspecified: Secondary | ICD-10-CM | POA: Insufficient documentation

## 2019-08-17 DIAGNOSIS — K7460 Unspecified cirrhosis of liver: Secondary | ICD-10-CM | POA: Diagnosis not present

## 2019-08-17 DIAGNOSIS — Z951 Presence of aortocoronary bypass graft: Secondary | ICD-10-CM | POA: Insufficient documentation

## 2019-08-17 DIAGNOSIS — I110 Hypertensive heart disease with heart failure: Secondary | ICD-10-CM | POA: Insufficient documentation

## 2019-08-17 DIAGNOSIS — N200 Calculus of kidney: Secondary | ICD-10-CM | POA: Diagnosis not present

## 2019-08-17 DIAGNOSIS — K439 Ventral hernia without obstruction or gangrene: Secondary | ICD-10-CM | POA: Diagnosis not present

## 2019-08-17 DIAGNOSIS — I2510 Atherosclerotic heart disease of native coronary artery without angina pectoris: Secondary | ICD-10-CM | POA: Diagnosis not present

## 2019-08-17 LAB — CBC
HCT: 34.2 % — ABNORMAL LOW (ref 39.0–52.0)
Hemoglobin: 11 g/dL — ABNORMAL LOW (ref 13.0–17.0)
MCH: 30.6 pg (ref 26.0–34.0)
MCHC: 32.2 g/dL (ref 30.0–36.0)
MCV: 95 fL (ref 80.0–100.0)
Platelets: 46 10*3/uL — ABNORMAL LOW (ref 150–400)
RBC: 3.6 MIL/uL — ABNORMAL LOW (ref 4.22–5.81)
RDW: 14.5 % (ref 11.5–15.5)
WBC: 5.9 10*3/uL (ref 4.0–10.5)
nRBC: 0.3 % — ABNORMAL HIGH (ref 0.0–0.2)

## 2019-08-17 LAB — COMPREHENSIVE METABOLIC PANEL
ALT: 17 U/L (ref 0–44)
AST: 34 U/L (ref 15–41)
Albumin: 3.1 g/dL — ABNORMAL LOW (ref 3.5–5.0)
Alkaline Phosphatase: 74 U/L (ref 38–126)
Anion gap: 8 (ref 5–15)
BUN: 18 mg/dL (ref 8–23)
CO2: 27 mmol/L (ref 22–32)
Calcium: 8.9 mg/dL (ref 8.9–10.3)
Chloride: 103 mmol/L (ref 98–111)
Creatinine, Ser: 1.4 mg/dL — ABNORMAL HIGH (ref 0.61–1.24)
GFR calc Af Amer: 52 mL/min — ABNORMAL LOW (ref >60)
GFR calc non Af Amer: 45 mL/min — ABNORMAL LOW (ref >60)
Glucose, Bld: 110 mg/dL — ABNORMAL HIGH (ref 70–99)
Potassium: 4.3 mmol/L (ref 3.5–5.1)
Sodium: 138 mmol/L (ref 135–145)
Total Bilirubin: 1.6 mg/dL — ABNORMAL HIGH (ref 0.3–1.2)
Total Protein: 4.8 g/dL — ABNORMAL LOW (ref 6.5–8.1)

## 2019-08-17 LAB — PROTIME-INR
INR: 2.2 — ABNORMAL HIGH (ref 0.8–1.2)
Prothrombin Time: 24 seconds — ABNORMAL HIGH (ref 11.4–15.2)

## 2019-08-17 LAB — TYPE AND SCREEN
ABO/RH(D): O POS
Antibody Screen: NEGATIVE

## 2019-08-17 NOTE — ED Triage Notes (Signed)
The pt has had bleeding from his penis today  According to family he has bled a large amount of bright red blood  He is on coumadin

## 2019-08-18 ENCOUNTER — Emergency Department (HOSPITAL_COMMUNITY): Payer: Medicare HMO

## 2019-08-18 DIAGNOSIS — K439 Ventral hernia without obstruction or gangrene: Secondary | ICD-10-CM | POA: Diagnosis not present

## 2019-08-18 DIAGNOSIS — I10 Essential (primary) hypertension: Secondary | ICD-10-CM | POA: Diagnosis not present

## 2019-08-18 DIAGNOSIS — K7460 Unspecified cirrhosis of liver: Secondary | ICD-10-CM | POA: Diagnosis not present

## 2019-08-18 DIAGNOSIS — N200 Calculus of kidney: Secondary | ICD-10-CM | POA: Diagnosis not present

## 2019-08-18 LAB — URINALYSIS, ROUTINE W REFLEX MICROSCOPIC
Bilirubin Urine: NEGATIVE
Glucose, UA: NEGATIVE mg/dL
Ketones, ur: NEGATIVE mg/dL
Leukocytes,Ua: NEGATIVE
Nitrite: NEGATIVE
Protein, ur: NEGATIVE mg/dL
Specific Gravity, Urine: 1.009 (ref 1.005–1.030)
pH: 5 (ref 5.0–8.0)

## 2019-08-18 MED ORDER — SODIUM CHLORIDE 0.9 % IR SOLN
3000.0000 mL | Status: DC
  Administered 2019-08-18: 500 mL

## 2019-08-18 NOTE — ED Notes (Signed)
Patient given discharge instructions. Questions were answered. Patient verbalized understanding of discharge instructions and care at home.  

## 2019-08-19 LAB — URINE CULTURE: Culture: NO GROWTH

## 2019-08-19 NOTE — ED Provider Notes (Addendum)
[facility]ERGENCY DEPARTMENT Provider Note   CSN: [id number] Arrival date & time: 08/17/19  2143     History Chief Complaint  Patient presents with  . bleeding from penis    [name] is a 86 y.o. male.   Hematuria This is a new problem. The current episode started 6 to 12 hours ago. The problem occurs constantly. The problem has not changed since onset.Pertinent negatives include no chest pain, no headaches and no shortness of breath. Nothing aggravates the symptoms. Nothing relieves the symptoms. He has tried nothing for the symptoms. The treatment provided no relief.       Past Medical History:  Diagnosis Date  . AAA (abdominal aortic aneurysm) ([place])   . Abdominal pain 04/14/2011  . Acute bronchitis 09/02/2013  . Acute vascular insufficiency of intestine (HCC)    Mesenteric embolus...surgery 2003  . Allergic rhinitis 09/09/2013  . Anxiety   . Aortic atherosclerosis ([place]) 11/10/2015  . Atrial fibrillation (HCC)    paroxysmal  . Back pain 08/08/2016  . Biventricular implantable cardiac defibrillator in situ    GDT CONTAK H170-MADIT-CRT-EXPLANTED 2011 implanted defibrillator- Guidant cognis, model n119-2001 Dr. Caryl Comes (11/28/2009)  . Breast tenderness    Spironolactone was stopped and eplerinone started. Patient feels better  . CAD (coronary artery disease)    CABG 1994  /   catheterization 1996, occluded vein graft to the diagonal, other grafts patent  /   catheterization 2006, no PCI  /  nuclear, October, 2011, extensive scar anteroseptal and apical, no ischemia  . Carotid artery disease ([place])    Doppler, February,  2012, 0-39% bilateral, recommended followup 1 year  . Chronic systolic heart failure (HCC)    chronic systolic   . Chronotropic incompetence   . Cirrhosis, nonalcoholic ([place]) 05/12/8784  . Colonic polyp 2010   pathology not clear.   . Crohn's disease ([place])   . Degenerative joint disease   . Dermatitis 04/05/2016  . Dizziness     Positional dizziness  . Edema leg    Venous Dopplers 8/13: No DVT bilaterally  . Ejection fraction    EF 35%, echo, November, 2011  //   Is 35-40%, echo,  September 23, 2011   . Fatigue 06/25/2008   Qualifier: Diagnosis of  By: [name], MD, Leonidas Romberg Dorinda Hill   . GERD 02/21/2007   Qualifier: Diagnosis of  By: Ronnald Ramp CNA/MA, Janett Billow    . Hyperglycemia 08/08/2016  . Hyperlipemia   . Hypertension   . Hypertrophy of prostate with urinary obstruction and other lower urinary tract symptoms (LUTS)   . Hypoalbuminemia 06/16/2013  . Hypokalemia 06/18/2013  . Implantable cardioverter-defibrillator (ICD) in situ 12/09/2009   Annotation: EXPLANTED 2011   //   New Guidant ICD implanted 2011     . Iron deficiency anemia   . Ischemic cardiomyopathy    CABG 1994 CAD catherization 1996.[name] occluded vein graft to the diagnol, other grafts patent/  catherization 2006, no PCI/ nuclear.[name] october 2011.[name] extensive scar anteroseptal and apical.. no ischemia    . LBBB (left bundle branch block)   . MCI (mild cognitive impairment) 02/27/2017  . 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 Mitral regurgitation    mild.[name] echo november 2011  EF 35%...echo.. november 2009/ ef 35%.[name] echo november 2011  . Oral thrush 02/05/2015  . OSA (obstructive sleep apnea) 09/16/2011   NPSG 2013:  AHI 34/hr.    . Polymyalgia rheumatica ([place])   . Shingles   . Sinus bradycardia   .  Skin cancer 08/08/2016  . Thrombocytopenia ([place]) 2008  . Thrush   . Tremor   . Ventral hernia    multiple, wears an abd binder, reports he hs been told he was at increased riisk for surgery  . Vitamin B12 deficiency   . Vitamin D deficiency 11/06/2008   Qualifier: Diagnosis of  By: Lenna Gilford MD, Deborra Medina   . Warfarin anticoagulation    Coumadin therapy for mesenteric embolus 2003    Patient Active Problem List   Diagnosis Date Noted  . Dementia ([place]) 12/16/2018  . Paroxysmal atrial fibrillation ([place]) 04/02/2018  . Encounter for therapeutic drug monitoring 03/08/2017    . Cognitive impairment 02/27/2017  . Preventative health care 10/17/2016  . Skin cancer 08/08/2016  . Hyperglycemia 08/08/2016  . Back pain 08/08/2016  . Dermatitis 04/05/2016  . Aortic atherosclerosis ([place]) 11/10/2015  . Cirrhosis, nonalcoholic ([place]) 26/33/3545  . Allergic rhinitis 09/09/2013  . Hypokalemia 06/18/2013  . Hypoalbuminemia 06/16/2013  . Left ankle pain 06/07/2013  . Thrombocytopenia ([place]) 11/30/2011  . Ejection fraction   . Edema leg   . OSA (obstructive sleep apnea) 09/16/2011  . Abdominal pain 04/14/2011  . CAD (coronary artery disease)   . Hx of CABG   . Hypertension   . Chronic systolic heart failure ([place])   . Mitral regurgitation   . Chronotropic incompetence   . Hyperlipemia   . Crohn's disease of large intestine ([place])   . Ventral hernia   . Tremor   . Warfarin anticoagulation   . Carotid artery disease ([place])   . 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 Implantable cardioverter-defibrillator (ICD) in situ 12/09/2009  . HYPERTROPHY PROSTATE W/UR OBST & OTH LUTS 03/31/2009  . Vitamin B 12 deficiency 11/06/2008  . Vitamin D deficiency 11/06/2008  . Cardiomyopathy, ischemic 09/15/2008  . LBBB (left bundle branch block) 06/25/2008  . Fatigue 06/25/2008  . COLONIC POLYPS 03/23/2007  . POLYMYALGIA RHEUMATICA 03/23/2007  . GERD 02/21/2007    Past Surgical History:  Procedure Laterality Date  . BIV ICD GENERATOR CHANGEOUT N/A 10/23/2017   Procedure: BIV ICD GENERATOR CHANGEOUT;  Surgeon: Deboraha Sprang, MD;  Location: [facility];  Service: Cardiovascular;  Laterality: N/A;  . CATARACT EXTRACTION, BILATERAL  2014  . COLONOSCOPY N/A 04/19/2013   Procedure: COLONOSCOPY;  Surgeon: Jerene Bears, MD;  Location: [facility] ENDOSCOPY;  Service: Endoscopy;  Laterality: N/A;  . CORONARY ARTERY BYPASS GRAFT  1994  . Elap w/ superior mesenteric artery embolectomy  1/03   by Dr. Jennette Banker  . ESOPHAGOGASTRODUODENOSCOPY N/A 04/18/2013   Procedure:  ESOPHAGOGASTRODUODENOSCOPY (EGD);  Surgeon: Jerene Bears, MD;  Location: [facility] ENDOSCOPY;  Service: Endoscopy;  Laterality: N/A;  . implantation of biventric cardioverter-defibrillatorr     by Dr. Lovena Le in 8/06 Guidant Contak H170-explanted 2011  . implantation of guidant cognis device     model n119-2011  . laparoscopic cholecystectomy  2002  . left inguinal hernia repair with mesh  6/08   by Dr. Betsy Pries  . PROSTATE SURGERY  08/2012   by urology in [place]  . Repair of incarcerated ventral hernia and lysis of adhesions  11/03   by Dr. Betsy Pries       Family History  Problem Relation Age of Onset  . Heart disease Mother   . Tremor Sister   . Heart failure Sister   . Heart disease Father 73  . Hypertension Father   . Appendicitis Brother        ruptured  . Diabetes  Son   . Heart disease Sister   . Hypertension Sister   . Dementia Sister   . Heart disease Sister   . Hypertension Brother   . Kidney disease Brother   . Myasthenia gravis Brother   . Cirrhosis Brother   . Leukemia Brother   . Hypertension Other   . Hyperlipidemia Other   . Heart disease Sister   . Edema Sister        pedal edema    Social History   Tobacco Use  . Smoking status: Former Smoker    Packs/day: 1.00    Years: 20.00    Pack years: 20.00    Types: Cigarettes    Quit date: 01/31/1973    Years since quitting: 46.5  . Smokeless tobacco: Never Used  Vaping Use  . Vaping Use: Never used  Substance Use Topics  . Alcohol use: Yes    Alcohol/week: 0.0 standard drinks    Comment: occasional  . Drug use: No    Home Medications Prior to Admission medications   Medication Sig Start Date End Date Taking? Authorizing Provider  acetaminophen (TYLENOL) 650 MG CR tablet Take 650 mg by mouth daily as needed for pain.   Yes [provider]  Ascorbic Acid (VITAMIN C) 100 MG tablet Take 100 mg by mouth daily.   Yes [provider]  azelastine (ASTELIN) 0.1 % nasal spray Place 2 sprays  into both nostrils 2 (two) times daily as needed for rhinitis or allergies. Use in each nostril as directed 05/15/18  Yes [name], MD  carvedilol (COREG) 6.25 MG tablet TAKE 1 TABLET BY MOUTH TWICE DAILY WITH MEALS Patient taking differently: Take 6.25 mg by mouth 2 (two) times daily with a meal.  06/13/19  Yes [name], MD  cholecalciferol (VITAMIN D) 1000 UNITS tablet Take 1,000 Units by mouth every evening.    Yes [provider]  cholestyramine (QUESTRAN) 4 g packet Take 1 packet (4 g total) by mouth daily. 09/04/18  Yes Pyrtle, Lajuan Lines, MD  donepezil (ARICEPT) 10 MG tablet Take 1 tablet (10 mg total) by mouth at bedtime. 05/20/19  Yes [name], MD  ferrous sulfate 325 (65 FE) MG tablet Take 325 mg by mouth daily with breakfast.   Yes [provider]  furosemide (LASIX) 40 MG tablet Take 1 tablet (40 mg total) by mouth daily. Take extra Lasix 40 mg if weight increases > 3 lbs in 1 day or if he has increased leg swelling 09/14/18 09/14/19 Yes Weaver, Scott T, PA-C  hydrocortisone cream 1 % Apply 1 application topically daily as needed for itching.   Yes [provider]  lisinopril (ZESTRIL) 10 MG tablet Take 1 tablet by mouth once daily Patient taking differently: Take 10 mg by mouth daily.  12/31/18  Yes Dorothy Spark, MD  Loperamide HCl (LOPERAMIDE A-D PO) Take 1 tablet by mouth 2 (two) times daily as needed (Diarrhea).   Yes [provider]  magnesium gluconate (MAGONATE) 500 MG tablet Take 500 mg by mouth as needed (cramps).   Yes [provider]  memantine (NAMENDA) 10 MG tablet Take 1 tablet by mouth twice daily Patient taking differently: Take 10 mg by mouth 2 (two) times daily.  07/02/19  Yes [name], MD  Menthol, Topical Analgesic, (BIOFREEZE EX) Apply 1 application topically daily as needed (pain).   Yes [provider]  pantoprazole (PROTONIX) 40 MG tablet TAKE 1 TABLET BY MOUTH ONCE DAILY  AT NOON Patient  taking differently: Take 40 mg by mouth daily.  04/30/19  Yes Sherren Mocha, MD  potassium chloride SA (KLOR-CON) 20 MEQ tablet Take 1 tablet by mouth once daily Patient taking differently: Take 20 mEq by mouth daily.  05/31/19  Yes Dorothy Spark, MD  Simethicone (GAS-X MAXIMUM STRENGTH PO) Take 1-2 tablets by mouth daily as needed (Flatulence).   Yes [provider]  simvastatin (ZOCOR) 20 MG tablet TAKE 1 TABLET BY MOUTH AT BEDTIME Patient taking differently: Take 20 mg by mouth at bedtime.  05/30/19  Yes [name], MD  vitamin B-12 (CYANOCOBALAMIN) 1000 MCG tablet Take 1,000 mcg by mouth daily.    Yes [provider]  warfarin (COUMADIN) 5 MG tablet Take 1 to 2 Tablets Daily as Directed by the [facility] Patient taking differently: Take 2.5-5 mg by mouth See admin instructions. Take 1/2 tablet on Tuesday and Saturday, and take 1 tablet on all other days 05/21/19  Yes Sherren Mocha, MD    Allergies    Patient has no known allergies.  Review of Systems   Review of Systems  Respiratory: Negative for shortness of breath.   Cardiovascular: Negative for chest pain.  Genitourinary: Positive for hematuria.  Neurological: Negative for headaches.  All other systems reviewed and are negative.   Physical Exam Updated Vital Signs BP 114/76   Pulse (!) 50   Temp 97.8 F (36.6 C) (Oral)   Resp 13   Ht 6' (1.829 m)   Wt 72.6 kg   SpO2 98%   BMI 21.70 kg/m   Physical Exam Vitals and nursing note reviewed.  Constitutional:      Appearance: He is well-developed.  HENT:     Head: Normocephalic and atraumatic.     Mouth/Throat:     Mouth: Mucous membranes are moist.     Pharynx: Oropharynx is clear.  Eyes:     Pupils: Pupils are equal, round, and reactive to light.  Cardiovascular:     Rate and Rhythm: Normal rate.  Pulmonary:     Effort: Pulmonary effort is normal. No respiratory distress.  Abdominal:     General: Abdomen is flat. There is no  distension.  Genitourinary:    Comments: Small amount of old blood around urethral opening. No active hemorrhage. Musculoskeletal:        General: Normal range of motion.     Cervical back: Normal range of motion.  Skin:    General: Skin is warm and dry.  Neurological:     General: No focal deficit present.     Mental Status: He is alert.     ED Results / Procedures / Treatments   Labs (all labs ordered are listed, but only abnormal results are displayed) Labs Reviewed  COMPREHENSIVE METABOLIC PANEL - Abnormal; Notable for the following components:      Result Value   Glucose, Bld 110 (*)    Creatinine, Ser 1.40 (*)    Total Protein 4.8 (*)    Albumin 3.1 (*)    Total Bilirubin 1.6 (*)    GFR calc non Af Amer 45 (*)    GFR calc Af Amer 52 (*)    All other components within normal limits  CBC - Abnormal; Notable for the following components:   RBC 3.60 (*)    Hemoglobin 11.0 (*)    HCT 34.2 (*)    Platelets 46 (*)    nRBC 0.3 (*)    All other components within  normal limits  PROTIME-INR - Abnormal; Notable for the following components:   Prothrombin Time 24.0 (*)    INR 2.2 (*)    All other components within normal limits  URINALYSIS, ROUTINE W REFLEX MICROSCOPIC - Abnormal; Notable for the following components:   Hgb urine dipstick LARGE (*)    Bacteria, UA RARE (*)    All other components within normal limits  URINE CULTURE  TYPE AND SCREEN    EKG None  Radiology CT Renal Stone Study  Result Date: 08/18/2019 CLINICAL DATA:  Hematuria. EXAM: CT ABDOMEN AND PELVIS WITHOUT CONTRAST TECHNIQUE: Multidetector CT imaging of the abdomen and pelvis was performed following the standard protocol without IV contrast. COMPARISON:  09/03/2016 FINDINGS: Lower chest: New right middle lobe 7 mm nodule on 09/05. Suspect mild interstitial lung disease. Mild cardiomegaly. Pacer. Tiny hiatal hernia. Hepatobiliary: Advanced cirrhosis, suboptimally evaluated. Cholecystectomy, without  biliary ductal dilatation. Pancreas: Grossly normal pancreas, without duct dilatation or acute inflammation. Spleen: Splenomegaly, 14.7 cm craniocaudal. Adrenals/Urinary Tract: Normal adrenal glands. Upper pole low-density 12 mm right renal lesion is likely a cyst. Bilateral renal collecting system calculi, maximally 3 mm. No hydronephrosis. No hydroureter or ureteric calculi. No bladder calculi. Stomach/Bowel: Proximal gastric underdistention. Ventral abdominal wall hernia contains nonobstructive transverse colon. Ventral abdominal wall laxity is mild and contains small bowel. Prior enterotomy with postoperative atony and mild bowel dilatation. Right paramidline ventral abdominal wall hernias are again identified containing nonobstructive small bowel. Vascular/Lymphatic: Advanced aortic and branch vessel atherosclerosis. Focal ectasia and probable wall thrombus involving the infrarenal aorta at 2.9 cm. Grossly similar. Prominent gastrohepatic ligament nodes are likely reactive in the setting of advanced cirrhosis. Reproductive: Normal prostate for age. Other: New small volume abdominal pelvic ascites. No free intraperitoneal air. Musculoskeletal: Presumed bone islands throughout the pelvis. Degenerate disc disease at L4-5. IMPRESSION: 1. Bilateral nephrolithiasis, without obstructive uropathy. No other explanation for hematuria. 2. Advanced cirrhosis and portal venous hypertension. New small volume abdominal pelvic ascites. 3. New right middle lobe 7 mm pulmonary nodule. Consider dedicated chest CT. 4. Ventral abdominal wall hernias containing nonobstructive small bowel and transverse colon. 5. Aortic Atherosclerosis (ICD10-I70.0). Electronically Signed   By: Abigail Miyamoto M.D.   On: 08/18/2019 05:28    Procedures Procedures (including critical care time)  Medications Ordered in ED Medications - No data to display  ED Course  I have reviewed the triage vital signs and the nursing notes.  Pertinent labs &  imaging results that were available during my care of the patient were reviewed by me and considered in my medical decision making (see chart for details).    MDM Rules/Calculators/A&P  Hematuria. No infection. Was able to be irrigated. No persistent bleeding after irrigation.   Final Clinical Impression(s) / ED Diagnoses Final diagnoses:  Hematuria, unspecified type    Rx / DC Orders ED Discharge Orders    None       Verlon Pischke, Corene Cornea, MD 08/19/19 [phone]

## 2019-08-20 ENCOUNTER — Ambulatory Visit (INDEPENDENT_AMBULATORY_CARE_PROVIDER_SITE_OTHER): Payer: Medicare HMO

## 2019-08-20 ENCOUNTER — Other Ambulatory Visit: Payer: Self-pay

## 2019-08-20 ENCOUNTER — Telehealth: Payer: Self-pay

## 2019-08-20 ENCOUNTER — Ambulatory Visit (INDEPENDENT_AMBULATORY_CARE_PROVIDER_SITE_OTHER): Payer: Medicare HMO | Admitting: *Deleted

## 2019-08-20 DIAGNOSIS — I255 Ischemic cardiomyopathy: Secondary | ICD-10-CM | POA: Diagnosis not present

## 2019-08-20 DIAGNOSIS — K55069 Acute infarction of intestine, part and extent unspecified: Secondary | ICD-10-CM

## 2019-08-20 DIAGNOSIS — Z0000 Encounter for general adult medical examination without abnormal findings: Secondary | ICD-10-CM | POA: Diagnosis not present

## 2019-08-20 DIAGNOSIS — Z5181 Encounter for therapeutic drug level monitoring: Secondary | ICD-10-CM

## 2019-08-20 LAB — CUP PACEART REMOTE DEVICE CHECK
Battery Remaining Longevity: 96 mo
Battery Remaining Percentage: 100 %
Brady Statistic RA Percent Paced: 92 %
Brady Statistic RV Percent Paced: 94 %
Date Time Interrogation Session: 20210720012100
HighPow Impedance: 37 Ohm
Implantable Lead Implant Date: 20060808
Implantable Lead Implant Date: 20060808
Implantable Lead Implant Date: 20060911
Implantable Lead Location: 753858
Implantable Lead Location: 753859
Implantable Lead Location: 753860
Implantable Lead Model: 158
Implantable Lead Model: 4087
Implantable Lead Serial Number: 165392
Implantable Lead Serial Number: 261368
Implantable Pulse Generator Implant Date: 20190923
Lead Channel Impedance Value: 332 Ohm
Lead Channel Impedance Value: 418 Ohm
Lead Channel Impedance Value: 434 Ohm
Lead Channel Setting Pacing Amplitude: 2 V
Lead Channel Setting Pacing Amplitude: 2.3 V
Lead Channel Setting Pacing Amplitude: 2.4 V
Lead Channel Setting Pacing Pulse Width: 0.4 ms
Lead Channel Setting Pacing Pulse Width: 0.8 ms
Lead Channel Setting Sensing Sensitivity: 0.5 mV
Lead Channel Setting Sensing Sensitivity: 1 mV
Pulse Gen Serial Number: 134188

## 2019-08-20 LAB — POCT INR: INR: 1.6 — AB (ref 2.0–3.0)

## 2019-08-20 NOTE — Patient Instructions (Signed)
Take an extra 1/2 tablet today and then continue warfarin 1 tablet daily  except for 1/2 tablet on Tuesdays and Saturdays. Recheck in 1 week.   Call us with any medication changes or concerns # [phone].

## 2019-08-20 NOTE — Telephone Encounter (Signed)
Spoke with wife.  She states that patient is not having any fevers, chills, or pain, but it is leaking.  She stated that she will try and get someone to go with her and patient to appt tomorrow in [place].

## 2019-08-20 NOTE — Telephone Encounter (Signed)
Pt's spouse called stating pt had been seen in the ER over the weekend and now needs his catheter removed.  Per spouse, pt cannot be seen at [facility] until the end of August.  The pt has an appt at [facility] for 7/21, which pt's spouse knew nothing about until I told her when she called our office.  She stated that was too far for her to drive and I provided her with that office's phone number so she could call and cancel that appt.  She stated se was told at the ER she could call the pt's PCP's office and maybe they could provide some help with finding someone who could remove the catheter sooner, possibly the ER here (?).

## 2019-08-21 ENCOUNTER — Ambulatory Visit: Payer: Medicare HMO | Admitting: Urology

## 2019-08-21 ENCOUNTER — Ambulatory Visit: Payer: Self-pay | Admitting: Urology

## 2019-08-21 ENCOUNTER — Encounter: Payer: Self-pay | Admitting: Urology

## 2019-08-21 VITALS — BP 120/62 | HR 60 | Ht 72.0 in | Wt 173.0 lb

## 2019-08-21 DIAGNOSIS — N3943 Post-void dribbling: Secondary | ICD-10-CM

## 2019-08-21 DIAGNOSIS — Z466 Encounter for fitting and adjustment of urinary device: Secondary | ICD-10-CM

## 2019-08-21 DIAGNOSIS — R310 Gross hematuria: Secondary | ICD-10-CM

## 2019-08-21 MED ORDER — SULFAMETHOXAZOLE-TRIMETHOPRIM 800-160 MG PO TABS
1.0000 | ORAL_TABLET | Freq: Once | ORAL | Status: DC
Start: 2019-08-21 — End: 2019-08-21

## 2019-08-21 MED ORDER — SULFAMETHOXAZOLE-TRIMETHOPRIM 800-160 MG PO TABS
1.0000 | ORAL_TABLET | Freq: Once | ORAL | Status: AC
  Administered 2019-08-21: 1 via ORAL

## 2019-08-21 NOTE — Progress Notes (Signed)
08/21/19 9:48 AM   [name] 1931-08-07 [id number]  CC: Gross hematuria  HPI: I saw Mr. Kenneth and his wife in urology clinic today for gross hematuria.  The majority of the history is obtained from his wife as the patient is minimally communicative.  He is an extremely comorbid 86 year old male who is on anticoagulation with Coumadin who presented to [facility] ED in [place] with 3 to 4 days of gross hematuria that was reportedly bright red per urethra.  His wife denies that there were any clots present.  He was seen in the ED on 7/18, but the notes are not particularly clear on his history or evaluation.  Ultimately it appears a CT stone protocol was performed that showed no abnormalities, non-distended bladder, no evidence of clot in the bladder.  A catheter was placed for unclear indications.  The catheter has been draining yellow urine since discharge from the ER.  He denies any urinary symptoms at baseline or history of gross hematuria.  He has had 10 pounds of weight loss over the last few months.  He denies any prior urology history or urologic surgeries, however it sounds like he had a TURP in [place] in 2014, but those notes are unavailable to me, and indication is unclear.  Overall, the history is very difficult to obtain between him and his elderly wife.  Urine culture at time of his ED visit showed no growth.  He has an at least 20-pack-year smoking history, quit in the 70s.   PMH: Past Medical History:  Diagnosis Date  . AAA (abdominal aortic aneurysm) ([place])   . Abdominal pain 04/14/2011  . Acute bronchitis 09/02/2013  . Acute vascular insufficiency of intestine (HCC)    Mesenteric embolus...surgery 2003  . Allergic rhinitis 09/09/2013  . Anxiety   . Aortic atherosclerosis ([place]) 11/10/2015  . Atrial fibrillation (HCC)    paroxysmal  . Back pain 08/08/2016  . Biventricular implantable cardiac defibrillator in situ    GDT CONTAK H170-MADIT-CRT-EXPLANTED 2011 implanted  defibrillator- Guidant cognis, model n119-2001 Dr. Caryl Comes (11/28/2009)  . Breast tenderness    Spironolactone was stopped and eplerinone started. Patient feels better  . CAD (coronary artery disease)    CABG 1994  /   catheterization 1996, occluded vein graft to the diagonal, other grafts patent  /   catheterization 2006, no PCI  /  nuclear, October, 2011, extensive scar anteroseptal and apical, no ischemia  . Carotid artery disease ([place])    Doppler, February,  2012, 0-39% bilateral, recommended followup 1 year  . Chronic systolic heart failure (HCC)    chronic systolic   . Chronotropic incompetence   . Cirrhosis, nonalcoholic ([place]) 09/25/35  . Colonic polyp 2010   pathology not clear.   . Crohn's disease ([place])   . Degenerative joint disease   . Dermatitis 04/05/2016  . Dizziness    Positional dizziness  . Edema leg    Venous Dopplers 8/13: No DVT bilaterally  . Ejection fraction    EF 35%, echo, November, 2011  //   Is 35-40%, echo,  September 23, 2011   . Fatigue 06/25/2008   Qualifier: Diagnosis of  By: [name], MD, Leonidas Romberg Dorinda Hill   . GERD 02/21/2007   Qualifier: Diagnosis of  By: Ronnald Ramp CNA/MA, Janett Billow    . Hyperglycemia 08/08/2016  . Hyperlipemia   . Hypertension   . Hypertrophy of prostate with urinary obstruction and other lower urinary tract symptoms (LUTS)   . Hypoalbuminemia 06/16/2013  .  Hypokalemia 06/18/2013  . Implantable cardioverter-defibrillator (ICD) in situ 12/09/2009   Annotation: EXPLANTED 2011   //   New Guidant ICD implanted 2011     . Iron deficiency anemia   . Ischemic cardiomyopathy    CABG 1994 CAD catherization 1996.[name] occluded vein graft to the diagnol, other grafts patent/  catherization 2006, no PCI/ nuclear.[name] october 2011.[name] extensive scar anteroseptal and apical.. no ischemia    . LBBB (left bundle branch block)   . MCI (mild cognitive impairment) 02/27/2017  . 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 Mitral regurgitation    mild.[name] echo november 2011  EF  35%...echo.. november 2009/ ef 35%.[name] echo november 2011  . Oral thrush 02/05/2015  . OSA (obstructive sleep apnea) 09/16/2011   NPSG 2013:  AHI 34/hr.    . Polymyalgia rheumatica ([place])   . Shingles   . Sinus bradycardia   . Skin cancer 08/08/2016  . Thrombocytopenia ([place]) 2008  . Thrush   . Tremor   . Ventral hernia    multiple, wears an abd binder, reports he hs been told he was at increased riisk for surgery  . Vitamin B12 deficiency   . Vitamin D deficiency 11/06/2008   Qualifier: Diagnosis of  By: Lenna Gilford MD, Deborra Medina   . Warfarin anticoagulation    Coumadin therapy for mesenteric embolus 2003    Surgical History: Past Surgical History:  Procedure Laterality Date  . BIV ICD GENERATOR CHANGEOUT N/A 10/23/2017   Procedure: BIV ICD GENERATOR CHANGEOUT;  Surgeon: Deboraha Sprang, MD;  Location: [facility];  Service: Cardiovascular;  Laterality: N/A;  . CATARACT EXTRACTION, BILATERAL  2014  . COLONOSCOPY N/A 04/19/2013   Procedure: COLONOSCOPY;  Surgeon: Jerene Bears, MD;  Location: [facility];  Service: Endoscopy;  Laterality: N/A;  . CORONARY ARTERY BYPASS GRAFT  1994  . Elap w/ superior mesenteric artery embolectomy  1/03   by Dr. Jennette Banker  . ESOPHAGOGASTRODUODENOSCOPY N/A 04/18/2013   Procedure: ESOPHAGOGASTRODUODENOSCOPY (EGD);  Surgeon: Jerene Bears, MD;  Location: [facility] ENDOSCOPY;  Service: Endoscopy;  Laterality: N/A;  . implantation of biventric cardioverter-defibrillatorr     by Dr. Lovena Le in 8/06 Guidant Contak H170-explanted 2011  . implantation of guidant cognis device     model n119-2011  . laparoscopic cholecystectomy  2002  . left inguinal hernia repair with mesh  6/08   by Dr. Betsy Pries  . PROSTATE SURGERY  08/2012   by urology in [place]  . Repair of incarcerated ventral hernia and lysis of adhesions  11/03   by Dr. Betsy Pries    Family History: Family History  Problem Relation Age of Onset  . Heart disease Mother   . Tremor Sister   . Heart failure Sister     . Heart disease Father 53  . Hypertension Father   . Appendicitis Brother        ruptured  . Diabetes Son   . Heart disease Sister   . Hypertension Sister   . Dementia Sister   . Heart disease Sister   . Hypertension Brother   . Kidney disease Brother   . Myasthenia gravis Brother   . Cirrhosis Brother   . Leukemia Brother   . Hypertension Other   . Hyperlipidemia Other   . Heart disease Sister   . Edema Sister        pedal edema    Social History:  reports that he quit smoking about 46 years ago. His smoking use included cigarettes. He has a  20.00 pack-year smoking history. He has never used smokeless tobacco. He reports current alcohol use. He reports that he does not use drugs.  Physical Exam: BP 120/62   Pulse 60   Ht 6' (1.829 m)   Wt 173 lb (78.5 kg)   BMI 23.46 kg/m    Constitutional: Frail-appearing, elderly Cardiovascular: No clubbing, cyanosis, or edema. Respiratory: Normal respiratory effort, no increased work of breathing. GI: Abdomen with extensive scarring and large ventral hernia Phallus with Foley catheter in place with clear yellow urine.  Laboratory Data: Reviewed, see HPI  Pertinent Imaging: I have personally reviewed the CT stone protocol, no obvious etiology of gross hematuria, prostate with what appears to be a small TURP defect.  Assessment & Plan:   86 year old very frail and comorbid male on anticoagulation with Coumadin who presented to an outside ED with a few days of significant gross hematuria and a catheter was placed for unclear indications.  CT without contrast showed no clear abnormalities, and bladder was nondistended.  Urine has been clear per catheter for the last few days.  His Foley catheter was removed in clinic today, and he was able to void the full 200 mL that were instilled.  Bactrim was given prior to catheter removal to prevent UTI.  We discussed common possible etiologies of hematuria including BPH, malignancy,  urolithiasis, medical renal disease, and idiopathic. Standard workup recommended by the AUA includes imaging with CT urogram to assess the upper tracts, and cystoscopy. Cytology is performed on patient's with gross hematuria to look for malignant cells in the urine.  I recommended CT urogram and cystoscopy to complete his gross hematuria work-up.  Even though he has a significant number of co-morbidities, it sounds like he had pretty impressive gross hematuria, and I feel it warrants further work-up.     I spent 65 total minutes on the day of the encounter including pre-visit review of the medical record, face-to-face time with the patient, and post visit ordering of labs/imaging/tests.  Nickolas Madrid, MD 08/21/2019  [facility] [address] [place], [place] [address] [phone]

## 2019-08-21 NOTE — Progress Notes (Signed)
Fill and Pull Catheter Removal  Patient is present today for a catheter removal.  Patient was cleaned and prepped in a sterile fashion 238m of sterile water/ saline was instilled into the bladder when the patient felt the urge to urinate. 190mof water was then drained from the balloon.  A 16FR foley cath was removed from the bladder no complications were noted .  Patient as then given some time to void on their own.  Patient can void  20068mn their own after some time.  Patient tolerated well.  Performed by: DeSEdwin DadaMA  Follow up/ Additional notes:

## 2019-08-21 NOTE — Patient Instructions (Signed)
Cystoscopy Cystoscopy is a procedure that is used to help diagnose and sometimes treat conditions that affect the lower urinary tract. The lower urinary tract includes the bladder and the urethra. The urethra is the tube that drains urine from the bladder. Cystoscopy is done using a thin, tube-shaped instrument with a light and camera at the end (cystoscope). The cystoscope may be hard or flexible, depending on the goal of the procedure. The cystoscope is inserted through the urethra, into the bladder. Cystoscopy may be recommended if you have:  Urinary tract infections that keep coming back.  Blood in the urine (hematuria).  An inability to control when you urinate (urinary incontinence) or an overactive bladder.  Unusual cells found in a urine sample.  A blockage in the urethra, such as a urinary stone.  Painful urination.  An abnormality in the bladder found during an intravenous pyelogram (IVP) or CT scan. Cystoscopy may also be done to remove a sample of tissue to be examined under a microscope (biopsy). What are the risks? Generally, this is a safe procedure. However, problems may occur, including:  Infection.  Bleeding.  What happens during the procedure?  1. You will be given one or more of the following: ? A medicine to numb the area (local anesthetic). 2. The area around the opening of your urethra will be cleaned. 3. The cystoscope will be passed through your urethra into your bladder. 4. Germ-free (sterile) fluid will flow through the cystoscope to fill your bladder. The fluid will stretch your bladder so that your health care provider can clearly examine your bladder walls. 5. Your doctor will look at the urethra and bladder. 6. The cystoscope will be removed The procedure may vary among health care providers  What can I expect after the procedure? After the procedure, it is common to have: 1. Some soreness or pain in your abdomen and urethra. 2. Urinary symptoms.  These include: ? Mild pain or burning when you urinate. Pain should stop within a few minutes after you urinate. This may last for up to 1 week. ? A small amount of blood in your urine for several days. ? Feeling like you need to urinate but producing only a small amount of urine. Follow these instructions at home: General instructions  Return to your normal activities as told by your health care provider.   Do not drive for 24 hours if you were given a sedative during your procedure.  Watch for any blood in your urine. If the amount of blood in your urine increases, call your health care provider.  If a tissue sample was removed for testing (biopsy) during your procedure, it is up to you to get your test results. Ask your health care provider, or the department that is doing the test, when your results will be ready.  Drink enough fluid to keep your urine pale yellow.  Keep all follow-up visits as told by your health care provider. This is important. Contact a health care provider if you:  Have pain that gets worse or does not get better with medicine, especially pain when you urinate.  Have trouble urinating.  Have more blood in your urine. Get help right away if you:  Have blood clots in your urine.  Have abdominal pain.  Have a fever or chills.  Are unable to urinate. Summary  Cystoscopy is a procedure that is used to help diagnose and sometimes treat conditions that affect the lower urinary tract.  Cystoscopy is done using   a thin, tube-shaped instrument with a light and camera at the end.  After the procedure, it is common to have some soreness or pain in your abdomen and urethra.  Watch for any blood in your urine. If the amount of blood in your urine increases, call your health care provider.  If you were prescribed an antibiotic medicine, take it as told by your health care provider. Do not stop taking the antibiotic even if you start to feel better. This  information is not intended to replace advice given to you by your health care provider. Make sure you discuss any questions you have with your health care provider. Document Revised: 01/09/2018 Document Reviewed: 01/09/2018 Elsevier Patient Education  2020 [employer]   

## 2019-08-22 NOTE — Progress Notes (Signed)
Remote ICD transmission.   

## 2019-08-23 ENCOUNTER — Ambulatory Visit (INDEPENDENT_AMBULATORY_CARE_PROVIDER_SITE_OTHER): Payer: Medicare HMO

## 2019-08-23 DIAGNOSIS — I5022 Chronic systolic (congestive) heart failure: Secondary | ICD-10-CM

## 2019-08-23 DIAGNOSIS — Z95810 Presence of automatic (implantable) cardiac defibrillator: Secondary | ICD-10-CM

## 2019-08-23 NOTE — Progress Notes (Signed)
EPIC Encounter for ICM Monitoring  Patient Name: [name] is a 86 y.o. male Date: 08/23/2019 Primary Care Physican: [name], MD Primary Cardiologist:Cooper/Weaver, PA Electrophysiologist: Caryl Comes LastWeight:185lbs Right Ventricular:94% Left Ventricular:93%    Total Time in AT/AF0 hours  Transmission reviewed.  7/20/2021[employer] Heart Failure Index is 5suggestingfluid levels are withinnormalthreshold range.  Prescribed:  Furosemide 40 mgTake 1 tablet (40 mg total) by mouth daily. Take extra Lasix 40 mg if weight increases > 3 lbs in 1 day or if he has increased leg swelling.  Potassium 20 mEq 1 tablet daily  Labs: 08/17/2019 Creatinine 1.40, BUN 18, Potassium 4.3, Sodium 138, GFR 45-52 A complete set of results can be found in Results Review.  Recommendations: None  Follow-up plan: ICM clinic phone appointment on 09/23/2019.   91 day device clinic remote transmission 11/19/2019.             EP/Cardiology next office visit: Recall 08/31/2019 with Richardson Dopp, PA and 12/31/2019 with Dr Caryl Comes         Copy of ICM check sent to Dr. Caryl Comes.  3 Month Trend    8 Day Data Trend          Rosalene Billings, RN 08/23/2019 5:02 PM

## 2019-08-26 ENCOUNTER — Other Ambulatory Visit: Payer: Self-pay

## 2019-08-26 ENCOUNTER — Ambulatory Visit (INDEPENDENT_AMBULATORY_CARE_PROVIDER_SITE_OTHER): Payer: Medicare HMO | Admitting: Family Medicine

## 2019-08-26 VITALS — BP 98/60 | HR 78 | Temp 97.6°F | Resp 12 | Ht 72.0 in | Wt 176.6 lb

## 2019-08-26 DIAGNOSIS — Z5181 Encounter for therapeutic drug level monitoring: Secondary | ICD-10-CM

## 2019-08-26 DIAGNOSIS — R739 Hyperglycemia, unspecified: Secondary | ICD-10-CM | POA: Diagnosis not present

## 2019-08-26 DIAGNOSIS — E538 Deficiency of other specified B group vitamins: Secondary | ICD-10-CM

## 2019-08-26 DIAGNOSIS — I10 Essential (primary) hypertension: Secondary | ICD-10-CM | POA: Diagnosis not present

## 2019-08-26 DIAGNOSIS — E782 Mixed hyperlipidemia: Secondary | ICD-10-CM | POA: Diagnosis not present

## 2019-08-26 DIAGNOSIS — C4490 Unspecified malignant neoplasm of skin, unspecified: Secondary | ICD-10-CM | POA: Diagnosis not present

## 2019-08-26 DIAGNOSIS — E559 Vitamin D deficiency, unspecified: Secondary | ICD-10-CM

## 2019-08-26 DIAGNOSIS — R319 Hematuria, unspecified: Secondary | ICD-10-CM | POA: Diagnosis not present

## 2019-08-26 DIAGNOSIS — E8809 Other disorders of plasma-protein metabolism, not elsewhere classified: Secondary | ICD-10-CM

## 2019-08-26 DIAGNOSIS — L989 Disorder of the skin and subcutaneous tissue, unspecified: Secondary | ICD-10-CM | POA: Diagnosis not present

## 2019-08-26 MED ORDER — SIMVASTATIN 20 MG PO TABS: 20.0000 mg | ORAL_TABLET | Freq: Every day | ORAL | 1 refills | Status: DC

## 2019-08-26 NOTE — Assessment & Plan Note (Signed)
Likely a keratin growth but he is referred to dermatology to have it removed as it frequently gets hit and irritated.

## 2019-08-26 NOTE — Assessment & Plan Note (Signed)
hgba1c acceptable, minimize simple carbs. Increase exercise as tolerated.  

## 2019-08-26 NOTE — Assessment & Plan Note (Signed)
Supplement and monitor 

## 2019-08-26 NOTE — Progress Notes (Signed)
Subjective:    Patient ID: [name], male    DOB: 02-Oct-1931, 86 y.o.   MRN: [id number]  Chief Complaint  Patient presents with  . Follow-up    HPI Patient is in today for follow up on chronic medical condition. He has recently been in the ER with hematuria. He had a catheter placed and it has now been removed. He has had no further bleeding episodes. No pain or abdominal complaints. No fevers or chills. Denies CP/palp/SOB/HA/congestion/fevers/GI or GU c/o. Taking meds as prescribed  Past Medical History:  Diagnosis Date  . AAA (abdominal aortic aneurysm) ([place])   . Abdominal pain 04/14/2011  . Acute bronchitis 09/02/2013  . Acute vascular insufficiency of intestine (HCC)    Mesenteric embolus...surgery 2003  . Allergic rhinitis 09/09/2013  . Anxiety   . Aortic atherosclerosis ([place]) 11/10/2015  . Atrial fibrillation (HCC)    paroxysmal  . Back pain 08/08/2016  . Biventricular implantable cardiac defibrillator in situ    GDT CONTAK H170-MADIT-CRT-EXPLANTED 2011 implanted defibrillator- Guidant cognis, model n119-2001 Dr. Caryl Comes (11/28/2009)  . Breast tenderness    Spironolactone was stopped and eplerinone started. Patient feels better  . CAD (coronary artery disease)    CABG 1994  /   catheterization 1996, occluded vein graft to the diagonal, other grafts patent  /   catheterization 2006, no PCI  /  nuclear, October, 2011, extensive scar anteroseptal and apical, no ischemia  . Carotid artery disease ([place])    Doppler, February,  2012, 0-39% bilateral, recommended followup 1 year  . Chronic systolic heart failure (HCC)    chronic systolic   . Chronotropic incompetence   . Cirrhosis, nonalcoholic ([place]) 6/71/2458  . Colonic polyp 2010   pathology not clear.   . Crohn's disease ([place])   . Degenerative joint disease   . Dermatitis 04/05/2016  . Dizziness    Positional dizziness  . Edema leg    Venous Dopplers 8/13: No DVT bilaterally  . Ejection fraction    EF 35%, echo,  November, 2011  //   Is 35-40%, echo,  September 23, 2011   . Fatigue 06/25/2008   Qualifier: Diagnosis of  By: [name], MD, Leonidas Romberg Dorinda Hill   . GERD 02/21/2007   Qualifier: Diagnosis of  By: Ronnald Ramp CNA/MA, Janett Billow    . Hyperglycemia 08/08/2016  . Hyperlipemia   . Hypertension   . Hypertrophy of prostate with urinary obstruction and other lower urinary tract symptoms (LUTS)   . Hypoalbuminemia 06/16/2013  . Hypokalemia 06/18/2013  . Implantable cardioverter-defibrillator (ICD) in situ 12/09/2009   Annotation: EXPLANTED 2011   //   New Guidant ICD implanted 2011     . Iron deficiency anemia   . Ischemic cardiomyopathy    CABG 1994 CAD catherization 1996.[name] occluded vein graft to the diagnol, other grafts patent/  catherization 2006, no PCI/ nuclear.[name] october 2011.[name] extensive scar anteroseptal and apical.. no ischemia    . LBBB (left bundle branch block)   . MCI (mild cognitive impairment) 02/27/2017  . 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 Mitral regurgitation    mild.[name] echo november 2011  EF 35%...echo.. november 2009/ ef 35%.[name] echo november 2011  . Oral thrush 02/05/2015  . OSA (obstructive sleep apnea) 09/16/2011   NPSG 2013:  AHI 34/hr.    . Polymyalgia rheumatica ([place])   . Shingles   . Sinus bradycardia   . Skin cancer 08/08/2016  . Thrombocytopenia ([place]) 2008  . Thrush   . Tremor   .  Ventral hernia    multiple, wears an abd binder, reports he hs been told he was at increased riisk for surgery  . Vitamin B12 deficiency   . Vitamin D deficiency 11/06/2008   Qualifier: Diagnosis of  By: Lenna Gilford MD, Deborra Medina   . Warfarin anticoagulation    Coumadin therapy for mesenteric embolus 2003    Past Surgical History:  Procedure Laterality Date  . BIV ICD GENERATOR CHANGEOUT N/A 10/23/2017   Procedure: BIV ICD GENERATOR CHANGEOUT;  Surgeon: Deboraha Sprang, MD;  Location: [facility];  Service: Cardiovascular;  Laterality: N/A;  . CATARACT EXTRACTION, BILATERAL  2014  . COLONOSCOPY N/A  04/19/2013   Procedure: COLONOSCOPY;  Surgeon: Jerene Bears, MD;  Location: [facility] ENDOSCOPY;  Service: Endoscopy;  Laterality: N/A;  . CORONARY ARTERY BYPASS GRAFT  1994  . Elap w/ superior mesenteric artery embolectomy  1/03   by Dr. Jennette Banker  . ESOPHAGOGASTRODUODENOSCOPY N/A 04/18/2013   Procedure: ESOPHAGOGASTRODUODENOSCOPY (EGD);  Surgeon: Jerene Bears, MD;  Location: [facility] ENDOSCOPY;  Service: Endoscopy;  Laterality: N/A;  . implantation of biventric cardioverter-defibrillatorr     by Dr. Lovena Le in 8/06 Guidant Contak H170-explanted 2011  . implantation of guidant cognis device     model n119-2011  . laparoscopic cholecystectomy  2002  . left inguinal hernia repair with mesh  6/08   by Dr. Betsy Pries  . PROSTATE SURGERY  08/2012   by urology in [place]  . Repair of incarcerated ventral hernia and lysis of adhesions  11/03   by Dr. Betsy Pries    Family History  Problem Relation Age of Onset  . Heart disease Mother   . Tremor Sister   . Heart failure Sister   . Heart disease Father 61  . Hypertension Father   . Appendicitis Brother        ruptured  . Diabetes Son   . Heart disease Sister   . Hypertension Sister   . Dementia Sister   . Heart disease Sister   . Hypertension Brother   . Kidney disease Brother   . Myasthenia gravis Brother   . Cirrhosis Brother   . Leukemia Brother   . Hypertension Other   . Hyperlipidemia Other   . Heart disease Sister   . Edema Sister        pedal edema    Social History   Socioeconomic History  . Marital status: Married    Spouse name: Kennyth Lose  . Number of children: 3  . Years of education: Not on file  . Highest education level: Not on file  Occupational History  . Occupation: bull Actuary  . Occupation: farmer    Fish farm manager: RETIRED  . Occupation: logger  Tobacco Use  . Smoking status: Former Smoker    Packs/day: 1.00    Years: 20.00    Pack years: 20.00    Types: Cigarettes    Quit date: 01/31/1973    Years since quitting: 46.5   . Smokeless tobacco: Never Used  Vaping Use  . Vaping Use: Never used  Substance and Sexual Activity  . Alcohol use: Yes    Alcohol/week: 0.0 standard drinks    Comment: occasional  . Drug use: No  . Sexual activity: Never    Comment: lives with wife, no dietary restrictions  Other Topics Concern  . Not on file  Social History Narrative   Lives in [place], [place] with wife.    Social Determinants of Health   Financial Resource Strain:   .  Difficulty of Paying Living Expenses:   Food Insecurity:   . Worried About Charity fundraiser in the Last Year:   . Arboriculturist in the Last Year:   Transportation Needs:   . Film/video editor (Medical):   [name] Lack of Transportation (Non-Medical):   Physical Activity:   . Days of Exercise per Week:   . Minutes of Exercise per Session:   Stress:   . Feeling of Stress :   Social Connections:   . Frequency of Communication with Friends and Family:   . Frequency of Social Gatherings with Friends and Family:   . Attends Religious Services:   . Active Member of Clubs or Organizations:   . Attends Archivist Meetings:   [name] Marital Status:   Intimate Partner Violence:   . Fear of Current or Ex-Partner:   . Emotionally Abused:   [name] Physically Abused:   . Sexually Abused:     Outpatient Medications Prior to Visit  Medication Sig Dispense Refill  . acetaminophen (TYLENOL) 650 MG CR tablet Take 650 mg by mouth daily as needed for pain.    . Ascorbic Acid (VITAMIN C) 100 MG tablet Take 100 mg by mouth daily.    [name] azelastine (ASTELIN) 0.1 % nasal spray Place 2 sprays into both nostrils 2 (two) times daily as needed for rhinitis or allergies. Use in each nostril as directed 30 mL 1  . carvedilol (COREG) 6.25 MG tablet TAKE 1 TABLET BY MOUTH TWICE DAILY WITH MEALS 180 tablet 0  . cholecalciferol (VITAMIN D) 1000 UNITS tablet Take 1,000 Units by mouth every evening.     . cholestyramine (QUESTRAN) 4 g packet Take 1 packet (4 g total)  by mouth daily. 30 each 3  . donepezil (ARICEPT) 10 MG tablet Take 1 tablet (10 mg total) by mouth at bedtime. 90 tablet 1  . ferrous sulfate 325 (65 FE) MG tablet Take 325 mg by mouth daily with breakfast.    . furosemide (LASIX) 40 MG tablet Take 1 tablet (40 mg total) by mouth daily. Take extra Lasix 40 mg if weight increases > 3 lbs in 1 day or if he has increased leg swelling 90 tablet 3  . hydrocortisone cream 1 % Apply 1 application topically daily as needed for itching.    [name] lisinopril (ZESTRIL) 10 MG tablet Take 1 tablet by mouth once daily 90 tablet 2  . Loperamide HCl (LOPERAMIDE A-D PO) Take 1 tablet by mouth 2 (two) times daily as needed (Diarrhea).    . magnesium gluconate (MAGONATE) 500 MG tablet Take 500 mg by mouth as needed (cramps).    . memantine (NAMENDA) 10 MG tablet Take 1 tablet by mouth twice daily 60 tablet 1  . Menthol, Topical Analgesic, (BIOFREEZE EX) Apply 1 application topically daily as needed (pain).    . pantoprazole (PROTONIX) 40 MG tablet TAKE 1 TABLET BY MOUTH ONCE DAILY AT NOON 90 tablet 3  . potassium chloride SA (KLOR-CON) 20 MEQ tablet Take 1 tablet by mouth once daily 90 tablet 0  . Simethicone (GAS-X MAXIMUM STRENGTH PO) Take 1-2 tablets by mouth daily as needed (Flatulence).    . vitamin B-12 (CYANOCOBALAMIN) 1000 MCG tablet Take 1,000 mcg by mouth daily.     [name] warfarin (COUMADIN) 5 MG tablet Take 1 to 2 Tablets Daily as Directed by the [facility] 95 tablet 0  . simvastatin (ZOCOR) 20 MG tablet TAKE 1 TABLET BY MOUTH AT BEDTIME 90 tablet  0   No facility-administered medications prior to visit.    No Known Allergies  Review of Systems  Constitutional: Negative for fever and malaise/fatigue.  HENT: Negative for congestion.   Eyes: Negative for blurred vision.  Respiratory: Negative for shortness of breath.   Cardiovascular: Negative for chest pain, palpitations and leg swelling.  Gastrointestinal: Negative for abdominal pain, blood in stool  and nausea.  Genitourinary: Negative for dysuria and frequency.  Musculoskeletal: Negative for falls.  Skin: Negative for rash.  Neurological: Negative for dizziness, loss of consciousness and headaches.  Endo/Heme/Allergies: Negative for environmental allergies.  Psychiatric/Behavioral: Positive for memory loss. Negative for depression. The patient is not nervous/anxious.        Objective:    Physical Exam Vitals and nursing note reviewed.  Constitutional:      General: He is not in acute distress.    Appearance: He is well-developed.  HENT:     Head: Normocephalic and atraumatic.     Nose: Nose normal.  Eyes:     General:        Right eye: No discharge.        Left eye: No discharge.  Cardiovascular:     Rate and Rhythm: Normal rate and regular rhythm.     Heart sounds: No murmur heard.   Pulmonary:     Effort: Pulmonary effort is normal.     Breath sounds: Normal breath sounds.  Abdominal:     General: Bowel sounds are normal.     Palpations: Abdomen is soft.     Tenderness: There is no abdominal tenderness.  Musculoskeletal:     Cervical back: Normal range of motion and neck supple.  Skin:    General: Skin is warm and dry.  Neurological:     Mental Status: He is alert and oriented to person, place, and time.     BP (!) 98/60 (BP Location: Right Arm, Cuff Size: Normal)   Pulse 78   Temp 97.6 F (36.4 C) (Oral)   Resp 12   Ht 6' (1.829 m)   Wt 176 lb 9.6 oz (80.1 kg)   SpO2 94%   BMI 23.95 kg/m  Wt Readings from Last 3 Encounters:  08/26/19 176 lb 9.6 oz (80.1 kg)  08/21/19 173 lb (78.5 kg)  08/17/19 160 lb (72.6 kg)    Diabetic Foot Exam - Simple   No data filed     Lab Results  Component Value Date   WBC 5.9 08/17/2019   HGB 11.0 (L) 08/17/2019   HCT 34.2 (L) 08/17/2019   PLT 46 (L) 08/17/2019   GLUCOSE 110 (H) 08/17/2019   CHOL 121 01/01/2019   TRIG 116.0 01/01/2019   HDL 46.40 01/01/2019   LDLDIRECT 81.6 12/30/2013   LDLCALC 51  01/01/2019   ALT 17 08/17/2019   AST 34 08/17/2019   NA 138 08/17/2019   K 4.3 08/17/2019   CL 103 08/17/2019   CREATININE 1.40 (H) 08/17/2019   BUN 18 08/17/2019   CO2 27 08/17/2019   TSH 2.68 02/27/2017   PSA 2.93 11/24/2010   INR 1.6 (A) 08/20/2019   HGBA1C 5.2 01/01/2019    Lab Results  Component Value Date   TSH 2.68 02/27/2017   Lab Results  Component Value Date   WBC 5.9 08/17/2019   HGB 11.0 (L) 08/17/2019   HCT 34.2 (L) 08/17/2019   MCV 95.0 08/17/2019   PLT 46 (L) 08/17/2019   Lab Results  Component Value Date   NA 138  08/17/2019   K 4.3 08/17/2019   CO2 27 08/17/2019   GLUCOSE 110 (H) 08/17/2019   BUN 18 08/17/2019   CREATININE 1.40 (H) 08/17/2019   BILITOT 1.6 (H) 08/17/2019   ALKPHOS 74 08/17/2019   AST 34 08/17/2019   ALT 17 08/17/2019   PROT 4.8 (L) 08/17/2019   ALBUMIN 3.1 (L) 08/17/2019   CALCIUM 8.9 08/17/2019   ANIONGAP 8 08/17/2019   GFR 57.46 (L) 01/01/2019   Lab Results  Component Value Date   CHOL 121 01/01/2019   Lab Results  Component Value Date   HDL 46.40 01/01/2019   Lab Results  Component Value Date   LDLCALC 51 01/01/2019   Lab Results  Component Value Date   TRIG 116.0 01/01/2019   Lab Results  Component Value Date   CHOLHDL 3 01/01/2019   Lab Results  Component Value Date   HGBA1C 5.2 01/01/2019       Assessment & Plan:   Problem List Items Addressed This Visit    Vitamin B 12 deficiency - Primary    Supplement and monitor      Relevant Orders   Vitamin B12   Vitamin D deficiency    Supplement and monitor      Relevant Orders   VITAMIN D 25 Hydroxy (Vit-D Deficiency, Fractures)   Hypertension   Relevant Medications   simvastatin (ZOCOR) 20 MG tablet   Other Relevant Orders   TSH   Hyperlipemia    Tolerating statin, encouraged heart healthy diet, avoid trans fats, minimize simple carbs and saturated fats. Increase exercise as tolerated      Relevant Medications   simvastatin (ZOCOR) 20 MG  tablet   Other Relevant Orders   Lipid panel   Hypoalbuminemia   Skin cancer   Hyperglycemia    hgba1c acceptable, minimize simple carbs. Increase exercise as tolerated.       Relevant Orders   Comprehensive metabolic panel   Hemoglobin A1c   Encounter for therapeutic drug monitoring   Skin lesion of left arm    Likely a keratin growth but he is referred to dermatology to have it removed as it frequently gets hit and irritated.      Relevant Orders   Ambulatory referral to Dermatology   Hematuria    He has had no further hematuria since he had his catheter removed. He is following with urology now and they plan to do a cystoscopy to evaluate what caused the bleeding      Relevant Orders   CBC w/Diff      I have changed [name] "[name]"'s simvastatin. I am also having him maintain his cholecalciferol, vitamin B-12, hydrocortisone cream, ferrous sulfate, vitamin C, azelastine, cholestyramine, furosemide, lisinopril, magnesium gluconate, pantoprazole, donepezil, warfarin, potassium chloride SA, carvedilol, memantine, Simethicone (GAS-X MAXIMUM STRENGTH PO), Loperamide HCl (LOPERAMIDE A-D PO), acetaminophen, and (Menthol, Topical Analgesic, (BIOFREEZE EX)).  Meds ordered this encounter  Medications  . simvastatin (ZOCOR) 20 MG tablet    Sig: Take 1 tablet (20 mg total) by mouth at bedtime.    Dispense:  90 tablet    Refill:  1     Penni Homans, MD

## 2019-08-26 NOTE — Assessment & Plan Note (Signed)
He has had no further hematuria since he had his catheter removed. He is following with urology now and they plan to do a cystoscopy to evaluate what caused the bleeding

## 2019-08-26 NOTE — Assessment & Plan Note (Signed)
Tolerating statin, encouraged heart healthy diet, avoid trans fats, minimize simple carbs and saturated fats. Increase exercise as tolerated 

## 2019-08-26 NOTE — Patient Instructions (Signed)
When the bowels get upset try drinking clear liquids x 24 hours then progress to 24 hours of bland diet called the BRAT (bananas, rice, applesauce, toast) diet.   Food Choices to Help Relieve Diarrhea, Adult When you have diarrhea, the foods you eat and your eating habits are very important. Choosing the right foods and drinks can help:  Relieve diarrhea.  Replace lost fluids and nutrients.  Prevent dehydration. What general guidelines should I follow?  Relieving diarrhea  Choose foods with less than 2 g or .07 oz. of fiber per serving.  Limit fats to less than 8 tsp (38 g or 1.34 oz.) a day.  Avoid the following: ? Foods and beverages sweetened with high-fructose corn syrup, honey, or sugar alcohols such as xylitol, sorbitol, and mannitol. ? Foods that contain a lot of fat or sugar. ? Fried, greasy, or spicy foods. ? High-fiber grains, breads, and cereals. ? Raw fruits and vegetables.  Eat foods that are rich in probiotics. These foods include dairy products such as yogurt and fermented milk products. They help increase healthy bacteria in the stomach and intestines (gastrointestinal tract, or GI tract).  If you have lactose intolerance, avoid dairy products. These may make your diarrhea worse.  Take medicine to help stop diarrhea (antidiarrheal medicine) only as told by your health care provider. Replacing nutrients  Eat small meals or snacks every 3-4 hours.  Eat bland foods, such as white rice, toast, or baked potato, until your diarrhea starts to get better. Gradually reintroduce nutrient-rich foods as tolerated or as told by your health care provider. This includes: ? Well-cooked protein foods. ? Peeled, seeded, and soft-cooked fruits and vegetables. ? Low-fat dairy products.  Take vitamin and mineral supplements as told by your health care provider. Preventing dehydration  Start by sipping water or a special solution to prevent dehydration (oral rehydration  solution, ORS). Urine that is clear or pale yellow means that you are getting enough fluid.  Try to drink at least 8-10 cups of fluid each day to help replace lost fluids.  You may add other liquids in addition to water, such as clear juice or decaffeinated sports drinks, as tolerated or as told by your health care provider.  Avoid drinks with caffeine, such as coffee, tea, or soft drinks.  Avoid alcohol. What foods are recommended?     The items listed may not be a complete list. Talk with your health care provider about what dietary choices are best for you. Grains White rice. White, [place], or pita breads (fresh or toasted), including plain rolls, buns, or bagels. White pasta. Saltine, soda, or graham crackers. Pretzels. Low-fiber cereal. Cooked cereals made with water (such as cornmeal, farina, or cream cereals). Plain muffins. Matzo. Melba toast. Zwieback. Vegetables Potatoes (without the skin). Most well-cooked and canned vegetables without skins or seeds. Tender lettuce. Fruits Apple sauce. Fruits canned in juice. Cooked apricots, cherries, grapefruit, peaches, pears, or plums. Fresh bananas and cantaloupe. Meats and other protein foods Baked or boiled chicken. Eggs. Tofu. Fish. Seafood. Smooth nut butters. Ground or well-cooked tender beef, ham, veal, lamb, pork, or poultry. Dairy Plain yogurt, kefir, and unsweetened liquid yogurt. Lactose-free milk, buttermilk, skim milk, or soy milk. Low-fat or nonfat hard cheese. Beverages Water. Low-calorie sports drinks. Fruit juices without pulp. Strained tomato and vegetable juices. Decaffeinated teas. Sugar-free beverages not sweetened with sugar alcohols. Oral rehydration solutions, if approved by your health care provider. Seasoning and other foods Bouillon, broth, or soups made from recommended foods.  What foods are not recommended? The items listed may not be a complete list. Talk with your health care provider about what dietary  choices are best for you. Grains Whole grain, whole wheat, bran, or rye breads, rolls, pastas, and crackers. Wild or brown rice. Whole grain or bran cereals. Barley. Oats and oatmeal. Corn tortillas or taco shells. Granola. Popcorn. Vegetables Raw vegetables. Fried vegetables. Cabbage, broccoli, Brussels sprouts, artichokes, baked beans, beet greens, corn, kale, legumes, peas, sweet potatoes, and yams. Potato skins. Cooked spinach and cabbage. Fruits Dried fruit, including raisins and dates. Raw fruits. Stewed or dried prunes. Canned fruits with syrup. Meat and other protein foods Fried or fatty meats. Deli meats. Chunky nut butters. Nuts and seeds. Beans and lentils. Berniece Salines. Hot dogs. Sausage. Dairy High-fat cheeses. Whole milk, chocolate milk, and beverages made with milk, such as milk shakes. Half-and-half. Cream. sour cream. Ice cream. Beverages Caffeinated beverages (such as coffee, tea, soda, or energy drinks). Alcoholic beverages. Fruit juices with pulp. Prune juice. Soft drinks sweetened with high-fructose corn syrup or sugar alcohols. High-calorie sports drinks. Fats and oils Butter. Cream sauces. Margarine. Salad oils. Plain salad dressings. Olives. Avocados. Mayonnaise. Sweets and desserts Sweet rolls, doughnuts, and sweet breads. Sugar-free desserts sweetened with sugar alcohols such as xylitol and sorbitol. Seasoning and other foods Honey. Hot sauce. Chili powder. Gravy. Cream-based or milk-based soups. Pancakes and waffles. Summary  When you have diarrhea, the foods you eat and your eating habits are very important.  Make sure you get at least 8-10 cups of fluid each day, or enough to keep your urine clear or pale yellow.  Eat bland foods and gradually reintroduce healthy, nutrient-rich foods as tolerated, or as told by your health care provider.  Avoid high-fiber, fried, greasy, or spicy foods. This information is not intended to replace advice given to you by your health  care provider. Make sure you discuss any questions you have with your health care provider. Document Revised: 05/10/2018 Document Reviewed: 01/15/2016 Elsevier Patient Education  [place]. Diarrhea, Adult Diarrhea is when you pass loose and watery poop (stool) often. Diarrhea can make you feel weak and cause you to lose water in your body (get dehydrated). Losing water in your body can cause you to:  Feel tired and thirsty.  Have a dry mouth.  Go pee (urinate) less often. Diarrhea often lasts 2-3 days. However, it can last longer if it is a sign of something more serious. It is important to treat your diarrhea as told by your doctor. Follow these instructions at home: Eating and drinking     Follow these instructions as told by your doctor:  Take an ORS (oral rehydration solution). This is a drink that helps you replace fluids and minerals your body lost. It is sold at pharmacies and stores.  Drink plenty of fluids, such as: ? Water. ? Ice chips. ? Diluted fruit juice. ? Low-calorie sports drinks. ? Milk, if you want.  Avoid drinking fluids that have a lot of sugar or caffeine in them.  Eat bland, easy-to-digest foods in small amounts as you are able. These foods include: ? Bananas. ? Applesauce. ? Rice. ? Low-fat (lean) meats. ? Toast. ? Crackers.  Avoid alcohol.  Avoid spicy or fatty foods.  Medicines  Take over-the-counter and prescription medicines only as told by your doctor.  If you were prescribed an antibiotic medicine, take it as told by your doctor. Do not stop using the antibiotic even if you start to feel  better. General instructions   Wash your hands often using soap and water. If soap and water are not available, use a hand sanitizer. Others in your home should wash their hands as well. Hands should be washed: ? After using the toilet or changing a diaper. ? Before preparing, cooking, or serving food. ? While caring for a sick  person. ? While visiting someone in a hospital.  Drink enough fluid to keep your pee (urine) pale yellow.  Rest at home while you get better.  Watch your condition for any changes.  Take a warm bath to help with any burning or pain from having diarrhea.  Keep all follow-up visits as told by your doctor. This is important. Contact a doctor if:  You have a fever.  Your diarrhea gets worse.  You have new symptoms.  You cannot keep fluids down.  You feel light-headed or dizzy.  You have a headache.  You have muscle cramps. Get help right away if:  You have chest pain.  You feel very weak or you pass out (faint).  You have bloody or black poop or poop that looks like tar.  You have very bad pain, cramping, or bloating in your belly (abdomen).  You have trouble breathing or you are breathing very quickly.  Your heart is beating very quickly.  Your skin feels cold and clammy.  You feel confused.  You have signs of losing too much water in your body, such as: ? Dark pee, very little pee, or no pee. ? Cracked lips. ? Dry mouth. ? Sunken eyes. ? Sleepiness. ? Weakness. Summary  Diarrhea is when you pass loose and watery poop (stool) often.  Diarrhea can make you feel weak and cause you to lose water in your body (get dehydrated).  Take an ORS (oral rehydration solution). This is a drink that is sold at pharmacies and stores.  Eat bland, easy-to-digest foods in small amounts as you are able.  Contact a doctor if your condition gets worse. Get help right away if you have signs that you have lost too much water in your body. This information is not intended to replace advice given to you by your health care provider. Make sure you discuss any questions you have with your health care provider. Document Revised: 06/23/2017 Document Reviewed: 06/23/2017 Elsevier Patient Education  [place].

## 2019-08-27 ENCOUNTER — Ambulatory Visit (INDEPENDENT_AMBULATORY_CARE_PROVIDER_SITE_OTHER): Payer: Medicare HMO

## 2019-08-27 DIAGNOSIS — K55069 Acute infarction of intestine, part and extent unspecified: Secondary | ICD-10-CM

## 2019-08-27 DIAGNOSIS — Z0000 Encounter for general adult medical examination without abnormal findings: Secondary | ICD-10-CM

## 2019-08-27 DIAGNOSIS — Z5181 Encounter for therapeutic drug level monitoring: Secondary | ICD-10-CM | POA: Diagnosis not present

## 2019-08-27 LAB — COMPREHENSIVE METABOLIC PANEL
ALT: 15 U/L (ref 0–53)
AST: 31 U/L (ref 0–37)
Albumin: 3.3 g/dL — ABNORMAL LOW (ref 3.5–5.2)
Alkaline Phosphatase: 74 U/L (ref 39–117)
BUN: 16 mg/dL (ref 6–23)
CO2: 31 mEq/L (ref 19–32)
Calcium: 9.1 mg/dL (ref 8.4–10.5)
Chloride: 104 mEq/L (ref 96–112)
Creatinine, Ser: 1.42 mg/dL (ref 0.40–1.50)
GFR: 47.24 mL/min — ABNORMAL LOW (ref >60.00)
Glucose, Bld: 89 mg/dL (ref 70–99)
Potassium: 4.7 mEq/L (ref 3.5–5.1)
Sodium: 139 mEq/L (ref 135–145)
Total Bilirubin: 1.9 mg/dL — ABNORMAL HIGH (ref 0.2–1.2)
Total Protein: 5 g/dL — ABNORMAL LOW (ref 6.0–8.3)

## 2019-08-27 LAB — CBC WITH DIFFERENTIAL/PLATELET
Basophils Absolute: 0.1 10*3/uL (ref 0.0–0.1)
Basophils Relative: 0.9 % (ref 0.0–3.0)
Eosinophils Absolute: 0.1 10*3/uL (ref 0.0–0.7)
Eosinophils Relative: 1.9 % (ref 0.0–5.0)
HCT: 35.4 % — ABNORMAL LOW (ref 39.0–52.0)
Hemoglobin: 12 g/dL — ABNORMAL LOW (ref 13.0–17.0)
Lymphocytes Relative: 52.5 % — ABNORMAL HIGH (ref 12.0–46.0)
Lymphs Abs: 3.7 10*3/uL (ref 0.7–4.0)
MCHC: 34 g/dL (ref 30.0–36.0)
MCV: 93.4 fl (ref 78.0–100.0)
Monocytes Absolute: 0.7 10*3/uL (ref 0.1–1.0)
Monocytes Relative: 9.7 % (ref 3.0–12.0)
Neutro Abs: 2.5 10*3/uL (ref 1.4–7.7)
Neutrophils Relative %: 35 % — ABNORMAL LOW (ref 43.0–77.0)
Platelets: 59 10*3/uL — ABNORMAL LOW (ref 150.0–400.0)
RBC: 3.78 Mil/uL — ABNORMAL LOW (ref 4.22–5.81)
RDW: 15 % (ref 11.5–15.5)
WBC: 7.1 10*3/uL (ref 4.0–10.5)

## 2019-08-27 LAB — HEMOGLOBIN A1C: Hgb A1c MFr Bld: 5 % (ref 4.6–6.5)

## 2019-08-27 LAB — VITAMIN B12: Vitamin B-12: >1526 pg/mL — ABNORMAL HIGH (ref 211–911)

## 2019-08-27 LAB — POCT INR: INR: 2.6 (ref 2.0–3.0)

## 2019-08-27 LAB — VITAMIN D 25 HYDROXY (VIT D DEFICIENCY, FRACTURES): VITD: 55.14 ng/mL (ref 30.00–100.00)

## 2019-08-27 NOTE — Patient Instructions (Signed)
continue warfarin 1 tablet daily  except for 1/2 tablet on Tuesdays and Saturdays. Recheck in 2 weeks.   Call us with any medication changes or concerns # [phone].

## 2019-08-29 ENCOUNTER — Encounter: Payer: Self-pay | Admitting: *Deleted

## 2019-09-04 ENCOUNTER — Other Ambulatory Visit: Payer: Self-pay

## 2019-09-04 ENCOUNTER — Other Ambulatory Visit: Payer: Self-pay | Admitting: Sports Medicine

## 2019-09-04 ENCOUNTER — Encounter: Payer: Self-pay | Admitting: Sports Medicine

## 2019-09-04 ENCOUNTER — Ambulatory Visit: Payer: Medicare HMO | Admitting: Sports Medicine

## 2019-09-04 ENCOUNTER — Ambulatory Visit (INDEPENDENT_AMBULATORY_CARE_PROVIDER_SITE_OTHER): Payer: Medicare HMO

## 2019-09-04 DIAGNOSIS — L84 Corns and callosities: Secondary | ICD-10-CM | POA: Diagnosis not present

## 2019-09-04 DIAGNOSIS — M779 Enthesopathy, unspecified: Secondary | ICD-10-CM | POA: Diagnosis not present

## 2019-09-04 DIAGNOSIS — M2042 Other hammer toe(s) (acquired), left foot: Secondary | ICD-10-CM

## 2019-09-04 DIAGNOSIS — M79672 Pain in left foot: Secondary | ICD-10-CM

## 2019-09-04 DIAGNOSIS — M79671 Pain in right foot: Secondary | ICD-10-CM

## 2019-09-04 DIAGNOSIS — M2040 Other hammer toe(s) (acquired), unspecified foot: Secondary | ICD-10-CM

## 2019-09-04 DIAGNOSIS — I739 Peripheral vascular disease, unspecified: Secondary | ICD-10-CM | POA: Diagnosis not present

## 2019-09-04 NOTE — Progress Notes (Signed)
Subjective: [name] is a 86 y.o. male patient who presents to office for evaluation of left foot pain for the last 4 months reports that he gets some hard skin underneath the fifth metatarsophalangeal joint and has issues with swelling secondary to heart failure.  Patient reports that pain is worse with walking and has treated the area by filing it down.  Patient is assisted by wife who helps to report this history.  Review of systems noncontributory.  Patient Active Problem List   Diagnosis Date Noted  . Skin lesion of left arm 08/26/2019  . Hematuria 08/26/2019  . Dementia ([place]) 12/16/2018  . Paroxysmal atrial fibrillation ([place]) 04/02/2018  . Encounter for therapeutic drug monitoring 03/08/2017  . Cognitive impairment 02/27/2017  . Preventative health care 10/17/2016  . Skin cancer 08/08/2016  . Hyperglycemia 08/08/2016  . Back pain 08/08/2016  . Dermatitis 04/05/2016  . Aortic atherosclerosis ([place]) 11/10/2015  . Cirrhosis, nonalcoholic ([place]) 95/62/1308  . Allergic rhinitis 09/09/2013  . Hypokalemia 06/18/2013  . Hypoalbuminemia 06/16/2013  . Left ankle pain 06/07/2013  . Thrombocytopenia ([place]) 11/30/2011  . Ejection fraction   . Edema leg   . OSA (obstructive sleep apnea) 09/16/2011  . Abdominal pain 04/14/2011  . CAD (coronary artery disease)   . Hx of CABG   . Hypertension   . Chronic systolic heart failure ([place])   . Mitral regurgitation   . Chronotropic incompetence   . Hyperlipemia   . Crohn's disease of large intestine ([place])   . Ventral hernia   . Tremor   . Warfarin anticoagulation   . Carotid artery disease ([place])   . 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 Implantable cardioverter-defibrillator (ICD) in situ 12/09/2009  . HYPERTROPHY PROSTATE W/UR OBST & OTH LUTS 03/31/2009  . Vitamin B 12 deficiency 11/06/2008  . Vitamin D deficiency 11/06/2008  . Cardiomyopathy, ischemic 09/15/2008  . LBBB (left bundle branch block) 06/25/2008  . Fatigue 06/25/2008   . COLONIC POLYPS 03/23/2007  . POLYMYALGIA RHEUMATICA 03/23/2007  . GERD 02/21/2007    Current Outpatient Medications on File Prior to Visit  Medication Sig Dispense Refill  . acetaminophen (TYLENOL) 650 MG CR tablet Take 650 mg by mouth daily as needed for pain.    . Ascorbic Acid (VITAMIN C) 100 MG tablet Take 100 mg by mouth daily.    [name] azelastine (ASTELIN) 0.1 % nasal spray Place 2 sprays into both nostrils 2 (two) times daily as needed for rhinitis or allergies. Use in each nostril as directed 30 mL 1  . carvedilol (COREG) 6.25 MG tablet TAKE 1 TABLET BY MOUTH TWICE DAILY WITH MEALS 180 tablet 0  . cholecalciferol (VITAMIN D) 1000 UNITS tablet Take 1,000 Units by mouth every evening.     . cholestyramine (QUESTRAN) 4 g packet Take 1 packet (4 g total) by mouth daily. 30 each 3  . donepezil (ARICEPT) 10 MG tablet Take 1 tablet (10 mg total) by mouth at bedtime. 90 tablet 1  . ferrous sulfate 325 (65 FE) MG tablet Take 325 mg by mouth daily with breakfast.    . furosemide (LASIX) 40 MG tablet Take 1 tablet (40 mg total) by mouth daily. Take extra Lasix 40 mg if weight increases > 3 lbs in 1 day or if he has increased leg swelling 90 tablet 3  . hydrocortisone cream 1 % Apply 1 application topically daily as needed for itching.    [name] lisinopril (ZESTRIL) 10 MG tablet Take 1 tablet by mouth once daily  90 tablet 2  . Loperamide HCl (LOPERAMIDE A-D PO) Take 1 tablet by mouth 2 (two) times daily as needed (Diarrhea).    . magnesium gluconate (MAGONATE) 500 MG tablet Take 500 mg by mouth as needed (cramps).    . memantine (NAMENDA) 10 MG tablet Take 1 tablet by mouth twice daily 60 tablet 1  . Menthol, Topical Analgesic, (BIOFREEZE EX) Apply 1 application topically daily as needed (pain).    . pantoprazole (PROTONIX) 40 MG tablet TAKE 1 TABLET BY MOUTH ONCE DAILY AT NOON 90 tablet 3  . potassium chloride SA (KLOR-CON) 20 MEQ tablet Take 1 tablet by mouth once daily 90 tablet 0  . Simethicone  (GAS-X MAXIMUM STRENGTH PO) Take 1-2 tablets by mouth daily as needed (Flatulence).    . simvastatin (ZOCOR) 20 MG tablet Take 1 tablet (20 mg total) by mouth at bedtime. 90 tablet 1  . vitamin B-12 (CYANOCOBALAMIN) 1000 MCG tablet Take 500 mcg by mouth daily.     [name] warfarin (COUMADIN) 5 MG tablet Take 1 to 2 Tablets Daily as Directed by the [facility] 95 tablet 0   No current facility-administered medications on file prior to visit.    No Known Allergies  Objective:  General: Alert and oriented x 2 in no acute distress  Dermatology: Keratotic lesion present submet 5 with skin lines transversing the lesion, minimal pain is present with direct pressure to the lesion with a central nucleated core noted, no webspace macerations, no ecchymosis bilateral, all nails x 10 are well manicured.  Vascular: Dorsalis Pedis and Posterior Tibial pedal pulses 1/4, Capillary Fill Time 5 seconds, no pedal hair growth bilateral, 1+ pitting edema bilateral lower extremities, Temperature gradient within normal limits.  Neurology: Gross sensation intact via light touch bilateral.  Musculoskeletal: Minimal tenderness to keratotic lesion site submet 5 on the left.  There is palpable metatarsal heads noted left foot with fat pad atrophy, there is severe rigid hammertoe deformities of all toes with the most contracted digits the fifth toe on the left.  X-rays left foot consistent with hammertoe deformity and arthritis  Assessment and Plan: Problem List Items Addressed This Visit    None    Visit Diagnoses    Hammer toe, unspecified laterality    -  Primary   Left foot pain       Capsulitis       Callus of foot       PVD (peripheral vascular disease) ([place])         -Complete examination performed -X-rays reviewed -Discussed treatment options for hammertoe and callus -Parred keratoic lesion using a chisel blade x1 on left; treated the area withSalinocaine covered with Band-Aid -Encouraged daily skin  emollients, gave sample of foot miracle cream -Encouraged use of pumice stone -Advised good supportive shoes and offloading padding as dispensed this visit and also gave patient a tailors bunion/fifth MPJ padding to use as tolerated -Advised elevation and medication management for edema control; continue with cardiology follow-up -Patient to return to office as needed or sooner if condition worsens.  Landis Martins, DPM

## 2019-09-06 ENCOUNTER — Other Ambulatory Visit: Payer: Self-pay | Admitting: Cardiology

## 2019-09-10 ENCOUNTER — Other Ambulatory Visit: Payer: Self-pay | Admitting: Sports Medicine

## 2019-09-10 ENCOUNTER — Ambulatory Visit (INDEPENDENT_AMBULATORY_CARE_PROVIDER_SITE_OTHER): Payer: Medicare HMO

## 2019-09-10 ENCOUNTER — Other Ambulatory Visit: Payer: Self-pay

## 2019-09-10 DIAGNOSIS — Z5181 Encounter for therapeutic drug level monitoring: Secondary | ICD-10-CM

## 2019-09-10 DIAGNOSIS — Z0000 Encounter for general adult medical examination without abnormal findings: Secondary | ICD-10-CM

## 2019-09-10 DIAGNOSIS — L814 Other melanin hyperpigmentation: Secondary | ICD-10-CM | POA: Diagnosis not present

## 2019-09-10 DIAGNOSIS — K55069 Acute infarction of intestine, part and extent unspecified: Secondary | ICD-10-CM

## 2019-09-10 DIAGNOSIS — C44629 Squamous cell carcinoma of skin of left upper limb, including shoulder: Secondary | ICD-10-CM | POA: Diagnosis not present

## 2019-09-10 DIAGNOSIS — L578 Other skin changes due to chronic exposure to nonionizing radiation: Secondary | ICD-10-CM | POA: Diagnosis not present

## 2019-09-10 DIAGNOSIS — M2042 Other hammer toe(s) (acquired), left foot: Secondary | ICD-10-CM

## 2019-09-10 LAB — POCT INR: INR: 3.5 — AB (ref 2.0–3.0)

## 2019-09-10 NOTE — Patient Instructions (Signed)
Hold today and then continue warfarin 1 tablet daily  except for 1/2 tablet on Tuesdays and Saturdays. Recheck in 3 weeks.   Call us with any medication changes or concerns # [phone].

## 2019-09-11 ENCOUNTER — Ambulatory Visit: Payer: Medicare HMO | Admitting: Urology

## 2019-09-11 ENCOUNTER — Encounter: Payer: Self-pay | Admitting: Urology

## 2019-09-11 ENCOUNTER — Other Ambulatory Visit: Payer: Self-pay | Admitting: Urology

## 2019-09-11 VITALS — BP 129/66 | HR 73 | Ht 72.0 in | Wt 176.0 lb

## 2019-09-11 DIAGNOSIS — R310 Gross hematuria: Secondary | ICD-10-CM

## 2019-09-11 MED ORDER — LIDOCAINE HCL URETHRAL/MUCOSAL 2 % EX GEL
1.0000 "application " | Freq: Once | CUTANEOUS | Status: AC
  Administered 2019-09-11: 1 via URETHRAL

## 2019-09-11 NOTE — Patient Instructions (Signed)
1. We will call you with the results of the urine cytology usually in about a week 2. Call us if any more episodes of blood in the urine

## 2019-09-11 NOTE — Progress Notes (Signed)
Cystoscopy Procedure Note:  Indication: Gross hematuria  After informed consent and discussion of the procedure and its risks, [name] was positioned and prepped in the standard fashion. Cystoscopy was performed with a flexible cystoscope. The urethra, bladder neck and entire bladder was visualized in a standard fashion. The prostate was moderate in size and consistent with prior TURP.  There was some subtle papillary change in the prostatic fossa just proximal to the sphincter, unclear if prostatic regrowth first low-grade urothelial tumor. The ureteral orifices were visualized in their normal location and orientation.  Mild bladder trabeculations.  No abnormalities on retroflexion.  Cytology sent.  Imaging: I had recommended a CT urogram, but this was never completed  CT stone protocol on 7/18 showed bilateral nephrolithiasis with no evidence of obstruction, no masses, advanced cirrhosis and portal venous hypertension with small volume ascites, right middle lobe 7 mm pulmonary nodule and further chest CT was recommended  Findings: Normal bladder, subtle papillary changes in prostatic fossa unclear if prostatic regrowth after TURP versus low-grade urothelial lesions  Assessment and Plan: Follow-up cytology, call with results.  If atypical or suspicious would recommend cystoscopy and biopsy RTC 6 months if cytology negative  He is very co-morbid and frail appearing, and I think we need to be very judicious in decision making regarding aggressive work-up and evaluation with his poor health  [name] Madrid, MD 09/11/2019

## 2019-09-12 LAB — URINALYSIS, COMPLETE
Bilirubin, UA: NEGATIVE
Glucose, UA: NEGATIVE
Nitrite, UA: NEGATIVE
Protein,UA: NEGATIVE
Specific Gravity, UA: 1.02 (ref 1.005–1.030)
Urobilinogen, Ur: 0.2 mg/dL (ref 0.2–1.0)
pH, UA: 5.5 (ref 5.0–7.5)

## 2019-09-12 LAB — MICROSCOPIC EXAMINATION

## 2019-09-12 LAB — CYTOLOGY - NON PAP

## 2019-09-13 LAB — PATHOLOGY

## 2019-09-17 ENCOUNTER — Telehealth: Payer: Self-pay

## 2019-09-17 NOTE — Telephone Encounter (Signed)
-----   Message from Billey Co, MD sent at 09/16/2019  9:23 AM EDT ----- No cancer cells on urine specimen, keep follow up as scheudled  Nickolas Madrid, MD 09/16/2019

## 2019-09-17 NOTE — Telephone Encounter (Signed)
Called pt's wife per DPR informed her of the information below. Wife gave verbal understanding.

## 2019-09-20 ENCOUNTER — Other Ambulatory Visit: Payer: Self-pay | Admitting: Family Medicine

## 2019-09-23 ENCOUNTER — Ambulatory Visit (INDEPENDENT_AMBULATORY_CARE_PROVIDER_SITE_OTHER): Payer: Medicare HMO

## 2019-09-23 DIAGNOSIS — Z95810 Presence of automatic (implantable) cardiac defibrillator: Secondary | ICD-10-CM

## 2019-09-23 DIAGNOSIS — I5022 Chronic systolic (congestive) heart failure: Secondary | ICD-10-CM | POA: Diagnosis not present

## 2019-09-24 ENCOUNTER — Telehealth: Payer: Self-pay | Admitting: Family Medicine

## 2019-09-24 NOTE — Telephone Encounter (Signed)
Caller: Patient's spouse Call Back # [phone]  Per spouse, patient foot is swelling and leg are too. Per Spouse lasix is no longer working properly    Please Advise

## 2019-09-24 NOTE — Telephone Encounter (Signed)
If they have only been taking the lasix once a day should increase it to twice a day for three days and then as needed. They should weigh him daily and report if he gains more than 3 # in 24 hours. If he is already taking it twice a day we can increase to tid x 3 days. Then on Friday would like to run a cmp, cbc to check on his kidneys.

## 2019-09-25 NOTE — Telephone Encounter (Signed)
Since he has been doing tid we need the labs on Friday to check his labs then we can add another medication.

## 2019-09-25 NOTE — Progress Notes (Signed)
EPIC Encounter for ICM Monitoring  Patient Name: [name] is a 86 y.o. male Date: 09/25/2019 Primary Care Physican: [name], MD Primary Cardiologist:Cooper/Weaver, PA Electrophysiologist: Caryl Comes 8/25/2021Weight:174lbs Right Ventricular:94% Left Ventricular:93%    Total Time in AT/AF0 hours  Spoke with wife.  She stated patient's legs are very swollen and he has been taking extra 40 mg Lasix x 1 week without any relief of swelling.  Swelling started 2-3 weeks ago.   8/22/2021[employer] Heart Failure Index has increased from 2 on 09/06/2019 to 11suggestingthe possibility that fluid may be starting to accumulate.  Prescribed:  Furosemide 40 mgTake 1 tablet (40 mg total) by mouth daily. Take extra Lasix 40 mg if weight increases > 3 lbs in 1 day or if he has increased leg swelling.  Potassium 20 mEq 1 tablet daily  Labs: 08/26/2019 Creatinine 1.42, BUN 16, Potassium 4.7, Sodium 139, GFR 47.24 08/17/2019 Creatinine 1.40, BUN 18, Potassium 4.3, Sodium 138,  A complete set of results can be found in Results Review.  Recommendations:Advised will send to copy of report to Dr Burt Knack for review and recommendations if needed.  Follow-up plan: ICM clinic phone appointment on 10/01/2019.   91 day device clinic remote transmission 11/19/2019.             EP/Cardiology next office visit: Recall 08/31/2019 with Richardson Dopp, PA and 12/31/2019 with Dr Caryl Comes         Copy of ICM check sent to Dr. Caryl Comes.  3 Month Trend    8 Day Data Trend          Rosalene Billings, RN 09/25/2019 9:28 AM

## 2019-09-25 NOTE — Telephone Encounter (Signed)
Spoke with patients wife.  She stated that he has been on this medication for a while and she has been giving patient 1 tab TID.  She will try and get him to come in to do labs on Friday.  She will also be giving the cardiologist a call to see what his recommendations are.

## 2019-09-26 ENCOUNTER — Telehealth: Payer: Self-pay | Admitting: Family Medicine

## 2019-09-26 ENCOUNTER — Telehealth: Payer: Self-pay | Admitting: Cardiovascular Disease

## 2019-09-26 NOTE — Telephone Encounter (Signed)
No answer no vm

## 2019-09-26 NOTE — Telephone Encounter (Signed)
Patient called stating she had spoken with a nurse about lab results. I informed her it was from the patient's PCP.

## 2019-09-26 NOTE — Telephone Encounter (Signed)
From Dr. Rhae Lerner message yesterday  Since he has been doing tid we need the labs on Friday to check his labs then we can add another medication.

## 2019-09-26 NOTE — Telephone Encounter (Signed)
New Message:   Pt's wife is calling and states she understands the pt has and appt for labs on tomorrow but she is more concerned with the swelling in his legs and ankles that has gotten worse since his last visit. Pt's wife she would like a call back as soon as possible. Please advise.

## 2019-09-27 ENCOUNTER — Other Ambulatory Visit: Payer: Self-pay

## 2019-09-27 ENCOUNTER — Other Ambulatory Visit (INDEPENDENT_AMBULATORY_CARE_PROVIDER_SITE_OTHER): Payer: Medicare HMO

## 2019-09-27 DIAGNOSIS — I10 Essential (primary) hypertension: Secondary | ICD-10-CM | POA: Diagnosis not present

## 2019-09-27 DIAGNOSIS — E782 Mixed hyperlipidemia: Secondary | ICD-10-CM

## 2019-09-27 NOTE — Progress Notes (Signed)
Per Epic notes, wife is working with Dr Charlett Blake regarding leg swelling.

## 2019-09-27 NOTE — Telephone Encounter (Signed)
No answer on mobile number and left message on home number to call back.

## 2019-09-27 NOTE — Progress Notes (Signed)
Attempted call to wife and no answer or answering machine.

## 2019-09-27 NOTE — Addendum Note (Signed)
Addended by: Kelle Darting A on: 09/27/2019 02:00 PM   Modules accepted: Orders

## 2019-09-30 ENCOUNTER — Telehealth: Payer: Self-pay

## 2019-09-30 NOTE — Telephone Encounter (Signed)
-----   Message from Sherren Mocha, MD sent at 09/30/2019  5:46 AM EDT -----   ----- Message ----- From: Rosalene Billings, RN Sent: 09/25/2019   9:53 AM EDT To: Sherren Mocha, MD  Dr Burt Knack, would you please review and provide any recommendations needed.  Pt has been taking extra Lasix x 1 week without relief of leg swelling.  Thank you

## 2019-09-30 NOTE — Telephone Encounter (Signed)
Sherren Mocha, MD at 09/23/2019 10:40 AM  Status: Signed    Valetta Fuller - can you add him to our Thursday schedule this week if he is able to come? Should have a BMET/BNP if no recent labs. thanks      Attempted to call the patient to schedule appointment for this Thursday.  Hung up after several rings as no one answered and no VM picked up. Will try again later.

## 2019-09-30 NOTE — Telephone Encounter (Signed)
The scheduler arranged appointment as documented and patient's DPR confirmed.

## 2019-09-30 NOTE — Progress Notes (Signed)
[name] - can you add him to our Thursday schedule this week if he is able to come? Should have a BMET/BNP if no recent labs. thanks

## 2019-09-30 NOTE — Telephone Encounter (Signed)
I have tried calling  twice but it looks like they did come in for labs.  Do you want to add anything if he still has the swelling?

## 2019-09-30 NOTE — Telephone Encounter (Signed)
Left message on wife's VM to call back to schedule appointment.  Will arrange appointment this Thursday, 9/2 at 1:40PM.

## 2019-10-01 ENCOUNTER — Ambulatory Visit (INDEPENDENT_AMBULATORY_CARE_PROVIDER_SITE_OTHER): Payer: Medicare HMO

## 2019-10-01 ENCOUNTER — Other Ambulatory Visit: Payer: Self-pay

## 2019-10-01 DIAGNOSIS — I5022 Chronic systolic (congestive) heart failure: Secondary | ICD-10-CM

## 2019-10-01 DIAGNOSIS — Z5181 Encounter for therapeutic drug level monitoring: Secondary | ICD-10-CM

## 2019-10-01 DIAGNOSIS — K55069 Acute infarction of intestine, part and extent unspecified: Secondary | ICD-10-CM

## 2019-10-01 DIAGNOSIS — Z95810 Presence of automatic (implantable) cardiac defibrillator: Secondary | ICD-10-CM

## 2019-10-01 DIAGNOSIS — Z0000 Encounter for general adult medical examination without abnormal findings: Secondary | ICD-10-CM

## 2019-10-01 LAB — POCT INR: INR: 3.2 — AB (ref 2.0–3.0)

## 2019-10-01 NOTE — Telephone Encounter (Signed)
Called [facility] to add on lab cmp.  Test added by Devoria Glassing.

## 2019-10-01 NOTE — Telephone Encounter (Signed)
Request sent to lab to add on Cmp.    Per wife swelling is no better but pt will be seeing cardiologist on Thursday.

## 2019-10-01 NOTE — Patient Instructions (Signed)
Hold today and then continue warfarin 1 tablet daily  except for 1/2 tablet on Tuesdays, Thursdays and Saturdays. Recheck in 3 weeks.   Call us with any medication changes or concerns # [phone].

## 2019-10-01 NOTE — Telephone Encounter (Signed)
He did come in but we did not get a cmp which is what I need to decide if he can tolerate the addition of Metolazone. If his swelling is improved or he has talked to cardiology then he does not need anything else.

## 2019-10-02 LAB — LIPID PANEL
Cholesterol: 119 mg/dL (ref <200)
HDL: 53 mg/dL (ref >=40)
LDL Cholesterol (Calc): 52 mg/dL (calc)
Non-HDL Cholesterol (Calc): 66 mg/dL (calc) (ref <130)
Total CHOL/HDL Ratio: 2.2 (calc) (ref <5.0)
Triglycerides: 63 mg/dL (ref <150)

## 2019-10-02 LAB — TEST AUTHORIZATION

## 2019-10-02 LAB — ADD ON CMP

## 2019-10-02 LAB — TSH: TSH: 1.91 mIU/L (ref 0.40–4.50)

## 2019-10-02 NOTE — Progress Notes (Signed)
EPIC Encounter for ICM Monitoring  Patient Name: [name] is a 86 y.o. male Date: 10/02/2019 Primary Care Physican: [name], MD Primary Cardiologist:Cooper/Weaver, PA Electrophysiologist: Caryl Comes 8/25/2021Weight:174lbs Right Ventricular:94% Left Ventricular:93%    Total Time in AT/AF0 hours  Spoke with wife and she reports patient's leg swelling remains unchanged.  Reminded of office visit with Dr Burt Knack tomorrow, 10/03/2019.   9/1/2021HeartLogic Heart Failure Index 10.  Thoracic impedance decreasingsuggestingthe possibility fluid accumulation may be developing.  Prescribed:  Furosemide 40 mgTake 1 tablet (40 mg total) by mouth daily. Take extra Lasix 40 mg if weight increases > 3 lbs in 1 day or if he has increased leg swelling.  Potassium 20 mEq 1 tablet daily  Labs: 08/26/2019 Creatinine 1.42, BUN 16, Potassium 4.7, Sodium 139, GFR 47.24 08/17/2019 Creatinine1.40, BUN18, Potassium4.3, Sodium138,  A complete set of results can be found in Results Review.  Recommendations:Patient has appointment with Dr Burt Knack on 9/2 to evaluate condition.  Follow-up plan: ICM clinic phone appointment on9/13/2021. 91 day device clinic remote transmission 11/19/2019.   EP/Cardiology next office visit: 10/03/2019 with Dr Burt Knack.  Recall 12/31/2019 with Dr Caryl Comes  Copy of ICM check sent to Dr. Caryl Comes.  3 Month Trend    8 Day Data Trend          Rosalene Billings, RN 10/02/2019 12:38 PM

## 2019-10-03 ENCOUNTER — Ambulatory Visit: Payer: Medicare HMO | Admitting: Cardiovascular Disease

## 2019-10-03 ENCOUNTER — Telehealth: Payer: Self-pay | Admitting: Cardiovascular Disease

## 2019-10-03 NOTE — Telephone Encounter (Signed)
Pt c/o swelling: STAT is pt has developed SOB within 24 hours  1) How much weight have you gained and in what time span? No weight gain  2) If swelling, where is the swelling located? Both feet, ankles, and legs  3) Are you currently taking a fluid pill? Yes  4) Are you currently SOB? No  5) Do you have a log of your daily weights (if so, list)? No  6) Have you gained 3 pounds in a day or 5 pounds in a week? No  7) Have you traveled recently? No

## 2019-10-03 NOTE — Telephone Encounter (Signed)
Attempted to call patient. Hung up after several rings as no one answered and no VM picked up to leave message.  Will try again later.

## 2019-10-04 NOTE — Telephone Encounter (Signed)
Received fax from [facility] and was unable to add cmp.  They stated that the specimen exceeds stability for test requested.  Patient is suppose to see cardiology.

## 2019-10-09 DIAGNOSIS — C44629 Squamous cell carcinoma of skin of left upper limb, including shoulder: Secondary | ICD-10-CM | POA: Diagnosis not present

## 2019-10-11 NOTE — Telephone Encounter (Signed)
Attempted to call patient. Hung up after several rings as no one answered and no VM picked up to leave message.  Will try again later.

## 2019-10-14 ENCOUNTER — Ambulatory Visit (INDEPENDENT_AMBULATORY_CARE_PROVIDER_SITE_OTHER): Payer: Medicare HMO

## 2019-10-14 DIAGNOSIS — I5022 Chronic systolic (congestive) heart failure: Secondary | ICD-10-CM

## 2019-10-14 DIAGNOSIS — Z95810 Presence of automatic (implantable) cardiac defibrillator: Secondary | ICD-10-CM

## 2019-10-14 NOTE — Telephone Encounter (Signed)
Patient's wife returning call to reschedule with Dr. Burt Knack. Patient is rescheduled for 11/25/19 but she feels he needs to be seen sooner. She states patient wants to see Dr. Burt Knack only and wants to know if he can be worked it earlier.

## 2019-10-16 NOTE — Progress Notes (Signed)
EPIC Encounter for ICM Monitoring  Patient Name: [name] is a 86 y.o. male Date: 10/16/2019 Primary Care Physican: [name], MD Primary Cardiologist:Cooper/Weaver, PA Electrophysiologist: Caryl Comes 8/25/2021Weight:174lbs Right Ventricular:95% Left Ventricular:93%    Total Time in AT/AF0 hours  Transmission reviewed.  9/12/2021HeartLogic Heart Failure Index 7 suggesting normal fluid levels.  Prescribed:  Furosemide 40 mgTake 1 tablet (40 mg total) by mouth daily. Take extra Lasix 40 mg if weight increases > 3 lbs in 1 day or if he has increased leg swelling.  Potassium 20 mEq 1 tablet daily  Labs: 08/26/2019 Creatinine 1.42, BUN 16, Potassium 4.7, Sodium 139, GFR 47.24 08/17/2019 Creatinine1.40, BUN18, Potassium4.3, Sodium138,  A complete set of results can be found in Results Review.  Recommendations:No changes  Follow-up plan: ICM clinic phone appointment on10/18/2021. 91 day device clinic remote transmission 11/19/2019.   EP/Cardiology next office visit: 11/25/2019 with Dr Burt Knack.  Recall 12/31/2019 with Dr Caryl Comes  Copy of ICM check sent to Dr. Caryl Comes.  3 Month Trend    8 Day Data Trend         Rosalene Billings, RN 10/16/2019 9:00 AM

## 2019-10-16 NOTE — Telephone Encounter (Signed)
Attempted to call patient. Hung up after several rings as no one answered and no VM picked up to leave message.  Will try again later.

## 2019-10-20 ENCOUNTER — Other Ambulatory Visit: Payer: Self-pay | Admitting: Cardiology

## 2019-10-21 ENCOUNTER — Other Ambulatory Visit: Payer: Self-pay | Admitting: *Deleted

## 2019-10-21 MED ORDER — LISINOPRIL 10 MG PO TABS
10.0000 mg | ORAL_TABLET | Freq: Every day | ORAL | 2 refills | Status: DC
Start: 2019-10-21 — End: 2020-07-28

## 2019-10-29 ENCOUNTER — Ambulatory Visit (INDEPENDENT_AMBULATORY_CARE_PROVIDER_SITE_OTHER): Payer: Medicare HMO

## 2019-10-29 ENCOUNTER — Other Ambulatory Visit: Payer: Self-pay

## 2019-10-29 DIAGNOSIS — Z5181 Encounter for therapeutic drug level monitoring: Secondary | ICD-10-CM | POA: Diagnosis not present

## 2019-10-29 DIAGNOSIS — Z0000 Encounter for general adult medical examination without abnormal findings: Secondary | ICD-10-CM | POA: Diagnosis not present

## 2019-10-29 DIAGNOSIS — K55069 Acute infarction of intestine, part and extent unspecified: Secondary | ICD-10-CM

## 2019-10-29 LAB — POCT INR: INR: 3.6 — AB (ref 2.0–3.0)

## 2019-10-29 NOTE — Patient Instructions (Signed)
Hold today and then decrease warfarin 1/2 tablet daily  except for 1 tablet on Monday, Wednesday and Friday. Recheck in 2 weeks.   Call us with any medication changes or concerns # [phone].

## 2019-10-30 NOTE — Telephone Encounter (Signed)
Scheduled the patient 10/12.  Offered earlier appointments with APPs but the patient's wife refused. She was grateful for assistance.

## 2019-11-11 ENCOUNTER — Other Ambulatory Visit: Payer: Self-pay | Admitting: Family Medicine

## 2019-11-12 ENCOUNTER — Other Ambulatory Visit: Payer: Self-pay | Admitting: Physician Assistant

## 2019-11-12 ENCOUNTER — Ambulatory Visit (INDEPENDENT_AMBULATORY_CARE_PROVIDER_SITE_OTHER): Payer: Medicare HMO | Admitting: *Deleted

## 2019-11-12 ENCOUNTER — Other Ambulatory Visit: Payer: Self-pay

## 2019-11-12 ENCOUNTER — Ambulatory Visit: Payer: Medicare HMO | Admitting: Cardiovascular Disease

## 2019-11-12 ENCOUNTER — Encounter: Payer: Self-pay | Admitting: Cardiovascular Disease

## 2019-11-12 VITALS — BP 110/70 | HR 60 | Ht 72.0 in | Wt 184.0 lb

## 2019-11-12 DIAGNOSIS — I5022 Chronic systolic (congestive) heart failure: Secondary | ICD-10-CM | POA: Diagnosis not present

## 2019-11-12 DIAGNOSIS — I2510 Atherosclerotic heart disease of native coronary artery without angina pectoris: Secondary | ICD-10-CM | POA: Diagnosis not present

## 2019-11-12 DIAGNOSIS — Z5181 Encounter for therapeutic drug level monitoring: Secondary | ICD-10-CM

## 2019-11-12 DIAGNOSIS — Z0000 Encounter for general adult medical examination without abnormal findings: Secondary | ICD-10-CM | POA: Diagnosis not present

## 2019-11-12 DIAGNOSIS — K55069 Acute infarction of intestine, part and extent unspecified: Secondary | ICD-10-CM | POA: Diagnosis not present

## 2019-11-12 DIAGNOSIS — I872 Venous insufficiency (chronic) (peripheral): Secondary | ICD-10-CM | POA: Diagnosis not present

## 2019-11-12 DIAGNOSIS — I480 Paroxysmal atrial fibrillation: Secondary | ICD-10-CM | POA: Diagnosis not present

## 2019-11-12 LAB — POCT INR: INR: 2.3 (ref 2.0–3.0)

## 2019-11-12 MED ORDER — FUROSEMIDE 40 MG PO TABS: 80.0000 mg | ORAL_TABLET | Freq: Every day | ORAL | 3 refills | Status: DC

## 2019-11-12 NOTE — Progress Notes (Signed)
Cardiology Office Note:    Date:  11/12/2019   ID:  [name], DOB 1931/04/09, MRN [id number]  PCP:  [name] Lukes, MD  [facility] Cardiologist:  [name] Mocha, MD  [facility] Electrophysiologist:  [name] Axe, MD   Referring MD: [name] Lukes, MD   Chief Complaint  Patient presents with   Leg Swelling    History of Present Illness:    [name] is a 86 y.o. male with a hx of coronary artery disease status post CABG, chronic systolic heart failure, history of CRT-D, paroxysmal atrial fibrillation maintained on warfarin, and abdominal aortic aneurysm, presenting for follow-up evaluation.  The patient is here with his daughter today.  He is a fairly limited historian.  The patient is quite hard of hearing and has some dementia.  His biggest problem is leg swelling.  He has a lot of leg swelling as the day goes on in both legs.  His legs feel heavy and it is harder for him to get around.  He is not as active as he used to be.  He does not eat too much sodium.  He has been taking increased doses of diuretics without much success.  He denies orthopnea, PND, shortness of breath, or chest pain.  He does complain of generalized fatigue.  He tries to elevate his legs in a recliner as best he can.  Past Medical History:  Diagnosis Date   AAA (abdominal aortic aneurysm) ([place])    Abdominal pain 04/14/2011   Acute bronchitis 09/02/2013   Acute vascular insufficiency of intestine (HCC)    Mesenteric embolus...surgery 2003   Allergic rhinitis 09/09/2013   Anxiety    Aortic atherosclerosis ([place]) 11/10/2015   Atrial fibrillation (HCC)    paroxysmal   Back pain 08/08/2016   Biventricular implantable cardiac defibrillator in situ    GDT CONTAK H170-MADIT-CRT-EXPLANTED 2011 implanted defibrillator- Guidant cognis, model n119-2001 Dr. Caryl Comes (11/28/2009)   Breast tenderness    Spironolactone was stopped and eplerinone started. Patient feels better   CAD (coronary  artery disease)    CABG 1994  /   catheterization 1996, occluded vein graft to the diagonal, other grafts patent  /   catheterization 2006, no PCI  /  nuclear, October, 2011, extensive scar anteroseptal and apical, no ischemia   Carotid artery disease ([place])    Doppler, February,  2012, 0-39% bilateral, recommended followup 1 year   Chronic systolic heart failure ([place])    chronic systolic    Chronotropic incompetence    Cirrhosis, nonalcoholic ([place]) 05/17/3843   Colonic polyp 2010   pathology not clear.    Crohn's disease ([place])    Degenerative joint disease    Dermatitis 04/05/2016   Dizziness    Positional dizziness   Edema leg    Venous Dopplers 8/13: No DVT bilaterally   Ejection fraction    EF 35%, echo, November, 2011  //   Is 35-40%, echo,  September 23, 2011    Fatigue 06/25/2008   Qualifier: Diagnosis of  By: [name], MD, [facility], Dorinda Hill    GERD 02/21/2007   Qualifier: Diagnosis of  By: Ronnald Ramp CNA/MA, Jessica     Hyperglycemia 08/08/2016   Hyperlipemia    Hypertension    Hypertrophy of prostate with urinary obstruction and other lower urinary tract symptoms (LUTS)    Hypoalbuminemia 06/16/2013   Hypokalemia 06/18/2013   Implantable cardioverter-defibrillator (ICD) in situ 12/09/2009   Annotation: EXPLANTED 2011   //   New Guidant  ICD implanted 2011      Iron deficiency anemia    Ischemic cardiomyopathy    CABG 1994 CAD catherization 1996.[name] occluded vein graft to the diagnol, other grafts patent/  catherization 2006, no PCI/ nuclear.[name] october 2011.[name] extensive scar anteroseptal and apical.. 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 november 2009/ ef 35%.[name] echo november 2011   Oral thrush 02/05/2015   OSA (obstructive sleep apnea) 09/16/2011   NPSG 2013:  AHI 34/hr.     Polymyalgia rheumatica (HCC)      Shingles    Sinus bradycardia    Skin cancer 08/08/2016   Thrombocytopenia ([place]) 2008   Thrush    Tremor    Ventral hernia    multiple, wears an abd binder, reports he hs been told he was at increased riisk for surgery   Vitamin B12 deficiency    Vitamin D deficiency 11/06/2008   Qualifier: Diagnosis of  By: Lenna Gilford MD, Deborra Medina    Warfarin anticoagulation    Coumadin therapy for mesenteric embolus 2003    Past Surgical History:  Procedure Laterality Date   BIV ICD GENERATOR CHANGEOUT N/A 10/23/2017   Procedure: BIV ICD GENERATOR CHANGEOUT;  Surgeon: Deboraha Sprang, MD;  Location: [facility];  Service: Cardiovascular;  Laterality: N/A;   CATARACT EXTRACTION, BILATERAL  2014   COLONOSCOPY N/A 04/19/2013   Procedure: COLONOSCOPY;  Surgeon: Jerene Bears, MD;  Location: [facility] ENDOSCOPY;  Service: Endoscopy;  Laterality: N/A;   CORONARY ARTERY BYPASS GRAFT  1994   Elap w/ superior mesenteric artery embolectomy  1/03   by Dr. Jennette Banker   ESOPHAGOGASTRODUODENOSCOPY N/A 04/18/2013   Procedure: ESOPHAGOGASTRODUODENOSCOPY (EGD);  Surgeon: Jerene Bears, MD;  Location: [facility] ENDOSCOPY;  Service: Endoscopy;  Laterality: N/A;   implantation of biventric cardioverter-defibrillatorr     by Dr. Lovena Le in 8/06 Guidant Contak H170-explanted 2011   implantation of guidant cognis device     model n119-2011   laparoscopic cholecystectomy  2002   left inguinal hernia repair with mesh  6/08   by Dr. Betsy Pries   PROSTATE SURGERY  08/2012   by urology in [place]   Repair of incarcerated ventral hernia and lysis of adhesions  11/03   by Dr. Betsy Pries    Current Medications: Current Meds  Medication Sig   acetaminophen (TYLENOL) 650 MG CR tablet Take 650 mg by mouth daily as needed for pain.   Ascorbic Acid (VITAMIN C) 100 MG tablet Take 100 mg by mouth daily.   azelastine (ASTELIN) 0.1 % nasal spray Place 2 sprays into both nostrils 2 (two) times daily as needed for rhinitis or  allergies. Use in each nostril as directed   carvedilol (COREG) 6.25 MG tablet Take 1 tablet (6.25 mg total) by mouth 2 (two) times daily with a meal.   cholecalciferol (VITAMIN D) 1000 UNITS tablet Take 1,000 Units by mouth every evening.    cholestyramine (QUESTRAN) 4 g packet Take 1 packet (4 g total) by mouth daily.   donepezil (ARICEPT) 10 MG tablet Take 1 tablet (10 mg total) by mouth at bedtime.   ferrous sulfate 325 (65 FE) MG tablet Take 325 mg by mouth daily with breakfast.   furosemide (LASIX) 40 MG tablet Take 1 tablet (40 mg total) by mouth daily. Take extra Lasix 40 mg if  weight increases > 3 lbs in 1 day or if he has increased leg swelling   hydrocortisone cream 1 % Apply 1 application topically daily as needed for itching.   lisinopril (ZESTRIL) 10 MG tablet Take 1 tablet (10 mg total) by mouth daily.   Loperamide HCl (LOPERAMIDE A-D PO) Take 1 tablet by mouth 2 (two) times daily as needed (Diarrhea).   magnesium gluconate (MAGONATE) 500 MG tablet Take 500 mg by mouth as needed (cramps).   memantine (NAMENDA) 10 MG tablet Take 1 tablet by mouth twice daily   Menthol, Topical Analgesic, (BIOFREEZE EX) Apply 1 application topically daily as needed (pain).   pantoprazole (PROTONIX) 40 MG tablet TAKE 1 TABLET BY MOUTH ONCE DAILY AT NOON   potassium chloride SA (KLOR-CON) 20 MEQ tablet Take 1 tablet (20 mEq total) by mouth daily. Pt needs to make appt with provider for further refills - 1st attempt   Simethicone (GAS-X MAXIMUM STRENGTH PO) Take 1-2 tablets by mouth daily as needed (Flatulence).   simvastatin (ZOCOR) 20 MG tablet Take 1 tablet (20 mg total) by mouth at bedtime.   vitamin B-12 (CYANOCOBALAMIN) 1000 MCG tablet Take 500 mcg by mouth daily.    warfarin (COUMADIN) 5 MG tablet Take 1 to 2 Tablets Daily as Directed by the Coumadin Clinic     Allergies:   Patient has no known allergies.   Social History   Socioeconomic History   Marital status: Married      Spouse name: Kennyth Lose   Number of children: 3   Years of education: Not on file   Highest education level: Not on file  Occupational History   Occupation: bull Actuary   Occupation: farmer    Employer: RETIRED   Occupation: logger  Tobacco Use   Smoking status: Former Smoker    Packs/day: 1.00    Years: 20.00    Pack years: 20.00    Types: Cigarettes    Quit date: 01/31/1973    Years since quitting: 46.8   Smokeless tobacco: Never Used  Vaping Use   Vaping Use: Never used  Substance and Sexual Activity   Alcohol use: Yes    Alcohol/week: 0.0 standard drinks    Comment: occasional   Drug use: No   Sexual activity: Never    Comment: lives with wife, no dietary restrictions  Other Topics Concern   Not on file  Social History Narrative   Lives in [place], [place] with wife.    Social Determinants of Health   Financial Resource Strain:    Difficulty of Paying Living Expenses: Not on file  Food Insecurity:    Worried About Charity fundraiser in the Last Year: Not on file   [employer] of Food in the Last Year: Not on file  Transportation Needs:    Lack of Transportation (Medical): Not on file   Lack of Transportation (Non-Medical): Not on file  Physical Activity:    Days of Exercise per Week: Not on file   Minutes of Exercise per Session: Not on file  Stress:    Feeling of Stress : Not on file  Social Connections:    Frequency of Communication with Friends and Family: Not on file   Frequency of Social Gatherings with Friends and Family: Not on file   Attends Religious Services: Not on file   Active Member of Clubs or Organizations: Not on file   Attends Archivist Meetings: Not on file   Marital Status: Not on file  Family History: The patient's family history includes Appendicitis in his brother; Cirrhosis in his brother; Dementia in his sister; Diabetes in his son; Edema in his sister; Heart disease in his mother, sister,  sister, and sister; Heart disease (age of onset: 36) in his father; Heart failure in his sister; Hyperlipidemia in an other family member; Hypertension in his brother, father, sister, and another family member; Kidney disease in his brother; Leukemia in his brother; Myasthenia gravis in his brother; Tremor in his sister.  ROS:   Please see the history of present illness.    Positive for memory loss.  Positive for fatigue.  All other systems reviewed and are negative.  EKGs/Labs/Other Studies Reviewed:    The following studies were reviewed today: Echocardiogram 11/03/2014: Study Conclusions   - Left ventricle: Hypokinesis of the basal and mid inferolateral  walls. The cavity size was mildly dilated. There was moderate  concentric hypertrophy. Systolic function was moderately reduced.  The estimated ejection fraction was in the range of 35% to 40%.  Wall motion was normal; there were no regional wall motion  abnormalities. Doppler parameters are consistent with abnormal  left ventricular relaxation (grade 1 diastolic dysfunction).  - Aortic root: The aortic root was normal in size.  - Left atrium: The atrium was moderately dilated.  - Right ventricle: Systolic function was normal.  - Right atrium: Pacer wire or catheter noted in right atrium.  - Inferior vena cava: The vessel was not visualized.  - Pericardium, extracardiac: There was no pericardial effusion.   EKG:  EKG is ordered today.  The ekg ordered today demonstrates AV sequential pacing 60 bpm  Recent Labs: 08/26/2019: ALT 15; BUN 16; Creatinine, Ser 1.42; Hemoglobin 12.0; Platelets 59.0; Potassium 4.7; Sodium 139 09/27/2019: TSH 1.91  Recent Lipid Panel    Component Value Date/Time   CHOL 119 09/27/2019 1400   TRIG 63 09/27/2019 1400   HDL 53 09/27/2019 1400   CHOLHDL 2.2 09/27/2019 1400   VLDL 23.2 01/01/2019 1011   LDLCALC 52 09/27/2019 1400   LDLDIRECT 81.6 12/30/2013 1213     Risk  Assessment/Calculations:     CHA2DS2-VASc Score = 5  This indicates a 7.2% annual risk of stroke. The patient's score is based upon: CHF History: 1 HTN History: 1 Diabetes History: 0 Stroke History: 0 Vascular Disease History: 1 Age Score: 2 Gender Score: 0      Physical Exam:    VS:  BP 110/70    Pulse 60    Ht 6' (1.829 m)    Wt 184 lb (83.5 kg)    SpO2 98%    BMI 24.95 kg/m     Wt Readings from Last 3 Encounters:  11/12/19 184 lb (83.5 kg)  09/11/19 176 lb (79.8 kg)  08/26/19 176 lb 9.6 oz (80.1 kg)     GEN: Elderly male, hard of hearing, in no acute distress HEENT: Normal NECK: No JVD; No carotid bruits LYMPHATICS: No lymphadenopathy CARDIAC: RRR, no murmurs, rubs, gallops RESPIRATORY:  Clear to auscultation without rales, wheezing or rhonchi  ABDOMEN: Soft, non-tender, non-distended MUSCULOSKELETAL: 3+ bilateral pretibial pitting edema; No deformity  SKIN: Warm and dry NEUROLOGIC:  Alert and oriented x 3 PSYCHIATRIC:  Normal affect   ASSESSMENT:    1. Chronic systolic heart failure (HCC)   2. Paroxysmal atrial fibrillation ([place])   3. Coronary artery disease involving native coronary artery of native heart without angina pectoris   4. Chronic venous insufficiency    PLAN:  In order of problems listed above:  1. We will update echocardiogram.  Will increase furosemide to 80 mg twice daily for 4 days, then resume 80 mg daily to see if we can mobilize some fluid from his legs.  Otherwise continue current medical therapy.  He is treated with lisinopril, carvedilol. 2. Anticoagulated with warfarin.  He is compliant.  His wife helps him with his medications. 3. No anginal symptoms.  Overall stable.  No antiplatelet therapy as prescribed in the setting of chronic oral anticoagulation with warfarin. 4. This seems to be his biggest problem at present.  Will give him information on the lounge doctor device to help with leg elevation.  Increase furosemide as  outlined.   Medication Adjustments/Labs and Tests Ordered: Current medicines are reviewed at length with the patient today.  Concerns regarding medicines are outlined above.  Orders Placed This Encounter  Procedures   EKG 12-Lead   ECHOCARDIOGRAM COMPLETE   No orders of the defined types were placed in this encounter.   Patient Instructions  Medication Instructions:  1) INCREASE LASIX to 80 mg twice daily for 4 days then resume 80 mg daily after that *If you need a refill on your cardiac medications before your next appointment, please call your pharmacy*  Testing/Procedures: Your provider has requested that you have an echocardiogram. Echocardiography is a painless test that uses sound waves to create images of your heart. It provides your doctor with information about the size and shape of your heart and how well your hearts chambers and valves are working. This procedure takes approximately one hour. There are no restrictions for this procedure.  Follow-Up: At [facility], you and your health needs are our priority.  As part of our continuing mission to provide you with exceptional heart care, we have created designated Provider Care Teams.  These Care Teams include your primary Cardiologist (physician) and Advanced Practice Providers (APPs -  Physician Assistants and Nurse Practitioners) who all work together to provide you with the care you need, when you need it. Your next appointment:   6 month(s) The format for your next appointment:   In Person Provider:   Richardson Dopp, PA-C   For your  leg edema you should do the following: 1. Leg elevation - I recommend the Lounge Dr. Leg rest.  See below for details  2. Salt restriction  -  Use potassium chloride instead of regular salt as a salt substitute. 3. Walk regularly 4. Compression hose - [facility] [employer] 5. Weight loss    Available on [email or web address] Or  Go to [email or web address]            Signed, [name] Mocha, MD  11/12/2019 11:39 AM    [place]

## 2019-11-12 NOTE — Patient Instructions (Signed)
Description    Continue taking warfarin 1/2 tablet daily  except for 1 tablet on Monday, Wednesday and Friday. Recheck in 3 weeks.   Call us with any medication changes or concerns # [phone].

## 2019-11-12 NOTE — Patient Instructions (Addendum)
Medication Instructions:  1) INCREASE LASIX to 80 mg twice daily for 4 days then resume 80 mg daily after that *If you need a refill on your cardiac medications before your next appointment, please call your pharmacy*  Testing/Procedures: Your provider has requested that you have an echocardiogram. Echocardiography is a painless test that uses sound waves to create images of your heart. It provides your doctor with information about the size and shape of your heart and how well your heart's chambers and valves are working. This procedure takes approximately one hour. There are no restrictions for this procedure.  Follow-Up: At [facility], you and your health needs are our priority.  As part of our continuing mission to provide you with exceptional heart care, we have created designated Provider Care Teams.  These Care Teams include your primary Cardiologist (physician) and Advanced Practice Providers (APPs -  Physician Assistants and Nurse Practitioners) who all work together to provide you with the care you need, when you need it. Your next appointment:   6 month(s) The format for your next appointment:   In Person Provider:   Richardson Dopp, PA-C   For your  leg edema you should do the following: 1. Leg elevation - I recommend the Lounge Dr. Leg rest.  See below for details  2. Salt restriction  -  Use potassium chloride instead of regular salt as a salt substitute. 3. Walk regularly 4. Compression hose - [facility] [employer] 5. Weight loss    Available on [email or web address] Or  Go to [email or web address]

## 2019-11-18 ENCOUNTER — Ambulatory Visit (INDEPENDENT_AMBULATORY_CARE_PROVIDER_SITE_OTHER): Payer: Medicare HMO

## 2019-11-18 DIAGNOSIS — I5022 Chronic systolic (congestive) heart failure: Secondary | ICD-10-CM

## 2019-11-18 DIAGNOSIS — Z95810 Presence of automatic (implantable) cardiac defibrillator: Secondary | ICD-10-CM | POA: Diagnosis not present

## 2019-11-19 ENCOUNTER — Ambulatory Visit (INDEPENDENT_AMBULATORY_CARE_PROVIDER_SITE_OTHER): Payer: Medicare HMO

## 2019-11-19 ENCOUNTER — Other Ambulatory Visit: Payer: Self-pay | Admitting: Family Medicine

## 2019-11-19 DIAGNOSIS — I255 Ischemic cardiomyopathy: Secondary | ICD-10-CM | POA: Diagnosis not present

## 2019-11-19 LAB — CUP PACEART REMOTE DEVICE CHECK
Battery Remaining Longevity: 90 mo
Battery Remaining Percentage: 100 %
Brady Statistic RA Percent Paced: 92 %
Brady Statistic RV Percent Paced: 95 %
Date Time Interrogation Session: 20211019012100
HighPow Impedance: 31 Ohm
Implantable Lead Implant Date: 20060808
Implantable Lead Implant Date: 20060808
Implantable Lead Implant Date: 20060911
Implantable Lead Location: 753858
Implantable Lead Location: 753859
Implantable Lead Location: 753860
Implantable Lead Model: 158
Implantable Lead Model: 4087
Implantable Lead Serial Number: 165392
Implantable Lead Serial Number: 261368
Implantable Pulse Generator Implant Date: 20190923
Lead Channel Impedance Value: 301 Ohm
Lead Channel Impedance Value: 394 Ohm
Lead Channel Impedance Value: 399 Ohm
Lead Channel Setting Pacing Amplitude: 2 V
Lead Channel Setting Pacing Amplitude: 2.3 V
Lead Channel Setting Pacing Amplitude: 2.4 V
Lead Channel Setting Pacing Pulse Width: 0.4 ms
Lead Channel Setting Pacing Pulse Width: 0.8 ms
Lead Channel Setting Sensing Sensitivity: 0.5 mV
Lead Channel Setting Sensing Sensitivity: 1 mV
Pulse Gen Serial Number: 134188

## 2019-11-20 NOTE — Progress Notes (Signed)
EPIC Encounter for ICM Monitoring  Patient Name: [name] is a 86 y.o. male Date: 11/20/2019 Primary Care Physican: [name], MD Primary Cardiologist:Cooper/Weaver, PA Electrophysiologist: Caryl Comes 10/12/2021Weight:184lbs Right Ventricular:95% Left Ventricular:94%    Total Time in AT/AF0 hours  Transmission reviewed.  10/19/2021HeartLogic Heart Failure Index 2 suggesting normal fluid levels.  Prescribed:  Furosemide 40 mgTake 2 tablets (80 mg total) by mouth daily.  Potassium 20 mEq 1 tablet daily  Labs: 08/26/2019 Creatinine 1.42, BUN 16, Potassium 4.7, Sodium 139, GFR 47.24 08/17/2019 Creatinine1.40, BUN18, Potassium4.3, Sodium138,  A complete set of results can be found in Results Review.  Recommendations:No changes  Follow-up plan: ICM clinic phone appointment on11/22/2021. 91 day device clinic remote transmission not scheduled yet.   EP/Cardiology next office visit:Recall 12/31/2019 with Dr Caryl Comes  Copy of ICM check sent to Dr. Caryl Comes..  3 Month Trend    8 Day Data Trend          Rosalene Billings, RN 11/20/2019 2:57 PM

## 2019-11-22 ENCOUNTER — Telehealth: Payer: Self-pay | Admitting: Cardiovascular Disease

## 2019-11-22 NOTE — Telephone Encounter (Signed)
Reviewed 11/19/2019 [facility] report and Heartlogic HF index was 2 suggesting fluid levels are normal (see ICM 11/19/2019 note for full report).  Will route to Dr Burt Knack and nurse for review since report is suggesting normal fluid levels.

## 2019-11-22 NOTE — Telephone Encounter (Signed)
Pt c/o swelling: STAT is pt has developed SOB within 24 hours  1) How much weight have you gained and in what time span? 10 pounds in 2 months  2) If swelling, where is the swelling located? Feet and legs  3) Are you currently taking a fluid pill? Yes, lasix was recently doubled but she states he is still swollen. Cannot get pants over legs  4) Are you currently SOB? no  5) Do you have a log of your daily weights (if so, list)? no  6) Have you gained 3 pounds in a day or 5 pounds in a week? no  7) Have you traveled recently? No   Patient saw Dr. Burt Knack on 11/12/19

## 2019-11-22 NOTE — Telephone Encounter (Signed)
Discussed with Dr. Burt Knack. The patient was seen recently and lasix increased to 80 mg x3 days then back to 80 mg daily. An echo is scheduled in the near future. Since ICM check was normal, will await further testing and continue to monitor.   Attempted to return call. Hung up after several rings as no one answered and no VM picked up.

## 2019-11-25 ENCOUNTER — Ambulatory Visit: Payer: Medicare HMO | Admitting: Cardiovascular Disease

## 2019-11-25 NOTE — Telephone Encounter (Signed)
Agree. thanks

## 2019-11-25 NOTE — Progress Notes (Signed)
Remote ICD transmission.   

## 2019-12-03 ENCOUNTER — Other Ambulatory Visit: Payer: Self-pay

## 2019-12-03 ENCOUNTER — Ambulatory Visit: Payer: Medicare HMO

## 2019-12-03 DIAGNOSIS — Z0000 Encounter for general adult medical examination without abnormal findings: Secondary | ICD-10-CM | POA: Diagnosis not present

## 2019-12-03 DIAGNOSIS — K55069 Acute infarction of intestine, part and extent unspecified: Secondary | ICD-10-CM | POA: Diagnosis not present

## 2019-12-03 DIAGNOSIS — Z5181 Encounter for therapeutic drug level monitoring: Secondary | ICD-10-CM

## 2019-12-03 LAB — POCT INR: INR: 2.5 (ref 2.0–3.0)

## 2019-12-03 NOTE — Patient Instructions (Signed)
Continue taking warfarin 1/2 tablet daily  except for 1 tablet on Monday, Wednesday and Friday. Recheck in 6 weeks.   Call us with any medication changes or concerns # [phone].

## 2019-12-04 ENCOUNTER — Ambulatory Visit (HOSPITAL_COMMUNITY): Payer: Medicare HMO | Attending: Cardiology

## 2019-12-04 DIAGNOSIS — I5022 Chronic systolic (congestive) heart failure: Secondary | ICD-10-CM | POA: Diagnosis not present

## 2019-12-04 LAB — ECHOCARDIOGRAM COMPLETE
Area-P 1/2: 1.63 cm2
P 1/2 time: 583 msec
S' Lateral: 4.1 cm

## 2019-12-10 ENCOUNTER — Ambulatory Visit: Payer: Medicare HMO | Admitting: Family Medicine

## 2019-12-10 ENCOUNTER — Telehealth: Payer: Medicare HMO | Admitting: Family Medicine

## 2019-12-10 DIAGNOSIS — Z0289 Encounter for other administrative examinations: Secondary | ICD-10-CM

## 2019-12-13 NOTE — Telephone Encounter (Signed)
Attempted to call patient with echo results. Hung up after several rings as no one answered and no VM picked up.  Will try again later.

## 2019-12-18 ENCOUNTER — Other Ambulatory Visit: Payer: Self-pay | Admitting: Family Medicine

## 2019-12-23 ENCOUNTER — Ambulatory Visit (INDEPENDENT_AMBULATORY_CARE_PROVIDER_SITE_OTHER): Payer: Medicare HMO

## 2019-12-23 DIAGNOSIS — Z95810 Presence of automatic (implantable) cardiac defibrillator: Secondary | ICD-10-CM

## 2019-12-23 DIAGNOSIS — I5022 Chronic systolic (congestive) heart failure: Secondary | ICD-10-CM

## 2019-12-24 NOTE — Progress Notes (Signed)
EPIC Encounter for ICM Monitoring  Patient Name: [name] is a 86 y.o. male Date: 12/24/2019 Primary Care Physican: [name], MD Primary Cardiologist:Cooper/Weaver, PA Electrophysiologist: Caryl Comes 10/12/2021Weight:184lbs Right Ventricular:95% Left Ventricular:94%    Total Time in AT/AF0 hours  Transmission reviewed.  11/23/2021HeartLogic Heart Failure Index0 suggesting normal fluid levels.  Prescribed:  Furosemide 40 mgTake 2 tablets (80 mg total) by mouth daily.  Potassium 20 mEq 1 tablet daily  Labs: 08/26/2019 Creatinine 1.42, BUN 16, Potassium 4.7, Sodium 139, GFR 47.24 08/17/2019 Creatinine1.40, BUN18, Potassium4.3, Sodium138,  A complete set of results can be found in Results Review.  Recommendations:No changes  Follow-up plan: ICM clinic phone appointment on12/27/2021. 91 day device clinic remote transmission 03/23/2020.   EP/Cardiology next office visit:Recall 12/31/2019 with Dr Caryl Comes  Copy of ICM check sent to Dr. Caryl Comes.  3 Month Trend   Rosalene Billings, RN 12/24/2019 9:20 AM

## 2019-12-27 ENCOUNTER — Other Ambulatory Visit: Payer: Self-pay | Admitting: Cardiovascular Disease

## 2020-01-09 NOTE — Telephone Encounter (Signed)
Attempted to call patient with echo results. Hung up after several rings as no one answered and no VM picked up.  Will try again later.

## 2020-01-11 ENCOUNTER — Other Ambulatory Visit: Payer: Self-pay | Admitting: Family Medicine

## 2020-01-16 NOTE — Telephone Encounter (Signed)
Attempted to call patient with echo results. Hung up after several rings as no one answered and no VM picked up.  Will send letter to patient to call back for echo results and for update on patient status.

## 2020-01-21 ENCOUNTER — Telehealth: Payer: Self-pay | Admitting: Cardiovascular Disease

## 2020-01-21 DIAGNOSIS — R0602 Shortness of breath: Secondary | ICD-10-CM

## 2020-01-21 DIAGNOSIS — I5022 Chronic systolic (congestive) heart failure: Secondary | ICD-10-CM

## 2020-01-21 NOTE — Telephone Encounter (Signed)
Recommend check a CBC, metabolic panel, and BNP.  Likely will change furosemide to torsemide pending those lab results.

## 2020-01-21 NOTE — Telephone Encounter (Signed)
Spoke with the patient's wife, who states she will try to get him to the office tomorrow for blood work. She will call if she needs to reschedule. She was grateful for call and agrees with plan.

## 2020-01-21 NOTE — Telephone Encounter (Signed)
Spoke with the patient's wife who states that the patient is having increased swelling in his legs and feet. She states that she does not think that his furosemide is working. He has been taking 80 mg daily. She states that for two days she gave the patient her torsemide and his swelling went down.  She reports that over the last two months the patient has gained 15 lbs. She is unsure about his weight gain over the past week. Reports he has some SOB.  He is elevating his feet and avoiding salt.  Will talk with Dr. Burt Knack and his nurse for recommendations.

## 2020-01-21 NOTE — Telephone Encounter (Signed)
Pt c/o swelling: STAT is pt has developed SOB within 24 hours  1) How much weight have you gained and in what time span? Not sure  2) If swelling, where is the swelling located? Legs and feet   3) Are you currently taking a fluid pill? yes  4) Are you currently SOB? Not now   5) Do you have a log of your daily weights (if so, list)? no  6) Have you gained 3 pounds in a day or 5 pounds in a week? Not sure  7) Have you traveled recently? No  Wife said the patient is extremely fatigued. She thinks it may be from trying to move with the extra fluid in his legs.  The fluid pill he is on does not make him urinate no matter how much he takes. The only time the patient was able to get rid of the excess fluid was to take a dose of his wife's Torsemide. The wife also got to a similar point where Lasix did not work for her, which is why she is on Torsemide.  Wife said the patient has what looks like a bruise behind his knee.

## 2020-01-22 ENCOUNTER — Other Ambulatory Visit: Payer: Medicare HMO | Admitting: *Deleted

## 2020-01-22 ENCOUNTER — Other Ambulatory Visit: Payer: Self-pay

## 2020-01-22 DIAGNOSIS — R0602 Shortness of breath: Secondary | ICD-10-CM | POA: Diagnosis not present

## 2020-01-22 DIAGNOSIS — I5022 Chronic systolic (congestive) heart failure: Secondary | ICD-10-CM | POA: Diagnosis not present

## 2020-01-23 LAB — CBC WITH DIFFERENTIAL/PLATELET
Basophils Absolute: 0 10*3/uL (ref 0.0–0.2)
Basos: 1 %
EOS (ABSOLUTE): 0.1 10*3/uL (ref 0.0–0.4)
Eos: 2 %
Hematocrit: 27.5 % — ABNORMAL LOW (ref 37.5–51.0)
Hemoglobin: 9.4 g/dL — ABNORMAL LOW (ref 13.0–17.7)
Immature Grans (Abs): 0 10*3/uL (ref 0.0–0.1)
Immature Granulocytes: 0 %
Lymphocytes Absolute: 2.9 10*3/uL (ref 0.7–3.1)
Lymphs: 52 %
MCH: 30.7 pg (ref 26.6–33.0)
MCHC: 34.2 g/dL (ref 31.5–35.7)
MCV: 90 fL (ref 79–97)
Monocytes Absolute: 0.6 10*3/uL (ref 0.1–0.9)
Monocytes: 10 %
Neutrophils Absolute: 2 10*3/uL (ref 1.4–7.0)
Neutrophils: 35 %
Platelets: 57 10*3/uL — CL (ref 150–450)
RBC: 3.06 x10E6/uL — ABNORMAL LOW (ref 4.14–5.80)
RDW: 14.2 % (ref 11.6–15.4)
WBC: 5.6 10*3/uL (ref 3.4–10.8)

## 2020-01-23 LAB — BASIC METABOLIC PANEL
BUN/Creatinine Ratio: 14 (ref 10–24)
BUN: 24 mg/dL (ref 8–27)
CO2: 29 mmol/L (ref 20–29)
Calcium: 9.4 mg/dL (ref 8.6–10.2)
Chloride: 104 mmol/L (ref 96–106)
Creatinine, Ser: 1.75 mg/dL — ABNORMAL HIGH (ref 0.76–1.27)
GFR calc Af Amer: 40 mL/min/{1.73_m2} — ABNORMAL LOW (ref >59)
GFR calc non Af Amer: 34 mL/min/{1.73_m2} — ABNORMAL LOW (ref >59)
Glucose: 156 mg/dL — ABNORMAL HIGH (ref 65–99)
Potassium: 4.5 mmol/L (ref 3.5–5.2)
Sodium: 143 mmol/L (ref 134–144)

## 2020-01-23 LAB — PRO B NATRIURETIC PEPTIDE: NT-Pro BNP: 1003 pg/mL — ABNORMAL HIGH (ref 0–486)

## 2020-01-27 ENCOUNTER — Ambulatory Visit (INDEPENDENT_AMBULATORY_CARE_PROVIDER_SITE_OTHER): Payer: Medicare HMO

## 2020-01-27 DIAGNOSIS — Z95810 Presence of automatic (implantable) cardiac defibrillator: Secondary | ICD-10-CM | POA: Diagnosis not present

## 2020-01-27 DIAGNOSIS — I5022 Chronic systolic (congestive) heart failure: Secondary | ICD-10-CM | POA: Diagnosis not present

## 2020-01-27 NOTE — Progress Notes (Signed)
EPIC Encounter for ICM Monitoring  Patient Name: [name] is a 86 y.o. male Date: 01/27/2020 Primary Care Physican: [name], MD Primary Cardiologist:Cooper/Weaver, PA Electrophysiologist: Caryl Comes 10/12/2021Weight:184lbs Right Ventricular:95% Left Ventricular:94%    Total Time in AT/AF0 hours  Transmission reviewed.  12/26/2021HeartLogic Heart Failure Index3suggesting normal fluid levels.  Prescribed:  Furosemide 40 mgTake2tablets(80 mg total) by mouth daily.  Potassium 20 mEq 1 tablet daily  Labs: 08/26/2019 Creatinine 1.42, BUN 16, Potassium 4.7, Sodium 139, GFR 47.24 08/17/2019 Creatinine1.40, BUN18, Potassium4.3, Sodium138,  A complete set of results can be found in Results Review.  Recommendations:No changes  Follow-up plan: ICM clinic phone appointment on2/02/2020. 91 day device clinic remote transmission2/21/2022.   EP/Cardiology next office visit:Recall 12/31/2019 with Dr Caryl Comes  Copy of ICM check sent to Dr. Caryl Comes.  3 month ICM trend: 01/26/2020    1 Year ICM trend:       Rosalene Billings, RN 01/27/2020 1:50 PM

## 2020-01-28 ENCOUNTER — Telehealth: Payer: Self-pay | Admitting: Cardiovascular Disease

## 2020-01-28 NOTE — Telephone Encounter (Signed)
Patient's wife calling for lab results.

## 2020-01-28 NOTE — Telephone Encounter (Addendum)
Spoke to wife. Pt scheduled to see [facility] PA tomorrow for follow up per Dr. Burt Knack lab result advisement. (wife aware where [facility] office is as she takes her sister there to see Dr. Loletha Grayer) Wife reports that pts swelling did go down some when she gave him couple days of her Torsemide (this was prior to christmas, see 12/21 telephone note).   She reports that pt is not taking his Lasix b/c it does not make him pee. Will forward to PA for his FYI for upcoming appt.

## 2020-01-29 ENCOUNTER — Ambulatory Visit (INDEPENDENT_AMBULATORY_CARE_PROVIDER_SITE_OTHER): Payer: Medicare HMO | Admitting: Cardiology

## 2020-01-29 ENCOUNTER — Encounter: Payer: Self-pay | Admitting: Cardiology

## 2020-01-29 ENCOUNTER — Ambulatory Visit (INDEPENDENT_AMBULATORY_CARE_PROVIDER_SITE_OTHER): Payer: Medicare HMO

## 2020-01-29 ENCOUNTER — Other Ambulatory Visit: Payer: Self-pay

## 2020-01-29 VITALS — BP 130/71 | HR 59 | Temp 96.8°F | Ht 72.0 in | Wt 182.2 lb

## 2020-01-29 DIAGNOSIS — F0390 Unspecified dementia without behavioral disturbance: Secondary | ICD-10-CM

## 2020-01-29 DIAGNOSIS — K55069 Acute infarction of intestine, part and extent unspecified: Secondary | ICD-10-CM | POA: Diagnosis not present

## 2020-01-29 DIAGNOSIS — I255 Ischemic cardiomyopathy: Secondary | ICD-10-CM

## 2020-01-29 DIAGNOSIS — Z5181 Encounter for therapeutic drug level monitoring: Secondary | ICD-10-CM | POA: Diagnosis not present

## 2020-01-29 DIAGNOSIS — Z7901 Long term (current) use of anticoagulants: Secondary | ICD-10-CM | POA: Diagnosis not present

## 2020-01-29 DIAGNOSIS — Z0000 Encounter for general adult medical examination without abnormal findings: Secondary | ICD-10-CM

## 2020-01-29 DIAGNOSIS — I5043 Acute on chronic combined systolic (congestive) and diastolic (congestive) heart failure: Secondary | ICD-10-CM

## 2020-01-29 DIAGNOSIS — Z951 Presence of aortocoronary bypass graft: Secondary | ICD-10-CM | POA: Diagnosis not present

## 2020-01-29 DIAGNOSIS — I480 Paroxysmal atrial fibrillation: Secondary | ICD-10-CM

## 2020-01-29 DIAGNOSIS — Z79899 Other long term (current) drug therapy: Secondary | ICD-10-CM

## 2020-01-29 DIAGNOSIS — Z95810 Presence of automatic (implantable) cardiac defibrillator: Secondary | ICD-10-CM

## 2020-01-29 DIAGNOSIS — R69 Illness, unspecified: Secondary | ICD-10-CM | POA: Diagnosis not present

## 2020-01-29 LAB — POCT INR: INR: 2.5 (ref 2.0–3.0)

## 2020-01-29 MED ORDER — TORSEMIDE 20 MG PO TABS: 40.0000 mg | ORAL_TABLET | Freq: Two times a day (BID) | ORAL | 6 refills | Status: DC

## 2020-01-29 NOTE — Assessment & Plan Note (Signed)
Paced rhythm

## 2020-01-29 NOTE — Assessment & Plan Note (Signed)
It appears his weight is up 10 lbs.  I have suggested torsemide 40 mg BID, f/u with labs in one week.  Increased SCr concerning- ? Cardio-renal syndrome.  He may need admission next week if labs and edema not improved.

## 2020-01-29 NOTE — Patient Instructions (Signed)
Continue taking warfarin 1/2 tablet daily  except for 1 tablet on Monday, Wednesday and Friday. Recheck in 6 weeks.   Call us with any medication changes or concerns # [phone].

## 2020-01-29 NOTE — Progress Notes (Signed)
Cardiology Office Note:    Date:  01/29/2020   ID:  [name], DOB 01-18-1932, MRN [id number]  PCP:  [name] Lukes, [name]  Cardiologist:  [name] Mocha, [name]  Electrophysiologist:  [name] Axe, [name]   Referring [name]: [name] Lukes, [name]   CC: edema  History of Present Illness:    [name] is a 86 y.o. male with a hx of chronic systolic heart failure, ischemic cardiomyopathy with an EF of 35 to 40% by echocardiogram November 2021, remote CABG 1994, history of ICD in place, last generator change was September 2019, PAF, Coumadin therapy, mild dementia and hard of hearing.  The patient is noted increasing weight and lower extremity edema since the summer.  This had been intermittent in the past but seems persistent since then according to the patient's wife.  His furosemide was increased with no significant result.  The patient's wife also has chronic edema and has torsemide.  She decided to give the patient her torsemide 40 mg twice daily.  This did seem to help with the edema.  It still present.  The patient's lab done 1222 2021 showed a BNP of 1000 and a bump in his creatinine to 1.75.  The patient's weight this summer was 175 pounds, he is now 185 pounds.  The patient does not have orthopnea.  He is not very active at home but she has not noted increasing dyspnea on exertion or rest shortness of breath.  Past Medical History:  Diagnosis Date  . AAA (abdominal aortic aneurysm) ([place])   . Abdominal pain 04/14/2011  . Acute bronchitis 09/02/2013  . Acute vascular insufficiency of intestine (HCC)    Mesenteric embolus...surgery 2003  . Allergic rhinitis 09/09/2013  . Anxiety   . Aortic atherosclerosis ([place]) 11/10/2015  . Atrial fibrillation (HCC)    paroxysmal  . Back pain 08/08/2016  . Biventricular implantable cardiac defibrillator in situ    GDT CONTAK H170-MADIT-CRT-EXPLANTED 2011 implanted defibrillator- Guidant cognis, model n119-2001 [name] (11/28/2009)  . Breast tenderness     Spironolactone was stopped and eplerinone started. Patient feels better  . CAD (coronary artery disease)    CABG 1994  /   catheterization 1996, occluded vein graft to the diagonal, other grafts patent  /   catheterization 2006, no PCI  /  nuclear, October, 2011, extensive scar anteroseptal and apical, no ischemia  . Carotid artery disease ([place])    Doppler, February,  2012, 0-39% bilateral, recommended followup 1 year  . Chronic systolic heart failure (HCC)    chronic systolic   . Chronotropic incompetence   . Cirrhosis, nonalcoholic ([place]) 05/09/8117  . Colonic polyp 2010   pathology not clear.   . Crohn's disease ([place])   . Degenerative joint disease   . Dermatitis 04/05/2016  . Dizziness    Positional dizziness  . Edema leg    Venous Dopplers 8/13: No DVT bilaterally  . Ejection fraction    EF 35%, echo, November, 2011  //   Is 35-40%, echo,  September 23, 2011   . Fatigue 06/25/2008   Qualifier: Diagnosis of  By: [name]   . GERD 02/21/2007   Qualifier: Diagnosis of  By: [name]    . Hyperglycemia 08/08/2016  . Hyperlipemia   . Hypertension   . Hypertrophy of prostate with urinary obstruction and other lower urinary tract symptoms (LUTS)   . Hypoalbuminemia 06/16/2013  . Hypokalemia 06/18/2013  . Implantable cardioverter-defibrillator (ICD) in situ 12/09/2009  Annotation: EXPLANTED 2011   //   New Guidant ICD implanted 2011     . Iron deficiency anemia   . Ischemic cardiomyopathy    CABG 1994 CAD catherization 1996.[name] occluded vein graft to the diagnol, other grafts patent/  catherization 2006, no PCI/ nuclear.[name] october 2011.[name] extensive scar anteroseptal and apical.. no ischemia    . LBBB (left bundle branch block)   . MCI (mild cognitive impairment) 02/27/2017  . 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 Mitral regurgitation    mild.[name] echo november 2011  EF 35%...echo.. november 2009/ ef 35%.[name] echo november 2011  . Oral thrush 02/05/2015  . OSA  (obstructive sleep apnea) 09/16/2011   NPSG 2013:  AHI 34/hr.    . Polymyalgia rheumatica ([employer])   . Shingles   . Sinus bradycardia   . Skin cancer 08/08/2016  . Thrombocytopenia ([place]) 2008  . Thrush   . Tremor   . Ventral hernia    multiple, wears an abd binder, reports he hs been told he was at increased riisk for surgery  . Vitamin B12 deficiency   . Vitamin D deficiency 11/06/2008   Qualifier: Diagnosis of  By: [name]   . Warfarin anticoagulation    Coumadin therapy for mesenteric embolus 2003    Past Surgical History:  Procedure Laterality Date  . BIV ICD GENERATOR CHANGEOUT N/A 10/23/2017   Procedure: BIV ICD GENERATOR CHANGEOUT;  Surgeon: [name];  Location: [facility];  Service: Cardiovascular;  Laterality: N/A;  . CATARACT EXTRACTION, BILATERAL  2014  . COLONOSCOPY N/A 04/19/2013   Procedure: COLONOSCOPY;  Surgeon: [name];  Location: [facility] ENDOSCOPY;  Service: Endoscopy;  Laterality: N/A;  . CORONARY ARTERY BYPASS GRAFT  1994  . Elap w/ superior mesenteric artery embolectomy  1/03   by [name]  . ESOPHAGOGASTRODUODENOSCOPY N/A 04/18/2013   Procedure: ESOPHAGOGASTRODUODENOSCOPY (EGD);  Surgeon: [name];  Location: [facility] ENDOSCOPY;  Service: Endoscopy;  Laterality: N/A;  . implantation of biventric cardioverter-defibrillatorr     by [name] in 8/06 Guidant Contak H170-explanted 2011  . implantation of guidant cognis device     model n119-2011  . laparoscopic cholecystectomy  2002  . left inguinal hernia repair with mesh  6/08   by [name]  . PROSTATE SURGERY  08/2012   by [name]  . Repair of incarcerated ventral hernia and lysis of adhesions  11/03   by [name]    Current Medications: Current Meds  Medication Sig  . acetaminophen (TYLENOL) 650 MG CR tablet Take 650 mg by mouth daily as needed for pain.  . Ascorbic Acid (VITAMIN C) 100 MG tablet Take 100 mg by mouth daily.  [name] azelastine (ASTELIN) 0.1  % nasal spray Place 2 sprays into both nostrils 2 (two) times daily as needed for rhinitis or allergies. Use in each nostril as directed  . carvedilol (COREG) 6.25 MG tablet Take 1 tablet (6.25 mg total) by mouth 2 (two) times daily with a meal.  . cholecalciferol (VITAMIN D) 1000 UNITS tablet Take 1,000 Units by mouth every evening.  . cholestyramine (QUESTRAN) 4 g packet Take 1 packet (4 g total) by mouth daily.  [name] donepezil (ARICEPT) 10 MG tablet Take 1 tablet (10 mg total) by mouth at bedtime.  . ferrous sulfate 325 (65 FE) MG tablet Take 325 mg by mouth daily with breakfast.  . hydrocortisone cream 1 % Apply 1 application topically daily as needed for  itching.  [name] lisinopril (ZESTRIL) 10 MG tablet Take 1 tablet (10 mg total) by mouth daily.  . Loperamide HCl (LOPERAMIDE A-D PO) Take 1 tablet by mouth 2 (two) times daily as needed (Diarrhea).  . magnesium gluconate (MAGONATE) 500 MG tablet Take 500 mg by mouth as needed (cramps).  . memantine (NAMENDA) 10 MG tablet Take 1 tablet by mouth twice daily  . Menthol, Topical Analgesic, (BIOFREEZE EX) Apply 1 application topically daily as needed (pain).  . pantoprazole (PROTONIX) 40 MG tablet TAKE 1 TABLET BY MOUTH ONCE DAILY AT NOON  . potassium chloride SA (KLOR-CON) 20 MEQ tablet Take 1 tablet (20 mEq total) by mouth daily. Pt needs to make appt with provider for further refills - 1st attempt  . Simethicone (GAS-X MAXIMUM STRENGTH PO) Take 1-2 tablets by mouth daily as needed (Flatulence).  . simvastatin (ZOCOR) 20 MG tablet Take 1 tablet (20 mg total) by mouth at bedtime.  . vitamin B-12 (CYANOCOBALAMIN) 1000 MCG tablet Take 500 mcg by mouth daily.   [name] warfarin (COUMADIN) 5 MG tablet TAKE 1/2-1 TABLET DAILY  AS DIRECTED BY THE COUMADIN CLINIC  . [DISCONTINUED] furosemide (LASIX) 40 MG tablet Take 2 tablets (80 mg total) by mouth daily.  . [DISCONTINUED] torsemide (DEMADEX) 20 MG tablet Take 2 tablets (40 mg total) by mouth 2 (two) times daily.      Allergies:   Patient has no known allergies.   Social History   Socioeconomic History  . Marital status: Married    Spouse name: Kennyth Lose  . Number of children: 3  . Years of education: Not on file  . Highest education level: Not on file  Occupational History  . Occupation: bull Actuary  . Occupation: farmer    Fish farm manager: RETIRED  . Occupation: logger  Tobacco Use  . Smoking status: Former Smoker    Packs/day: 1.00    Years: 20.00    Pack years: 20.00    Types: Cigarettes    Quit date: 01/31/1973    Years since quitting: 47.0  . Smokeless tobacco: Never Used  Vaping Use  . Vaping Use: Never used  Substance and Sexual Activity  . Alcohol use: Yes    Alcohol/week: 0.0 standard drinks    Comment: occasional  . Drug use: No  . Sexual activity: Never    Comment: lives with wife, no dietary restrictions  Other Topics Concern  . Not on file  Social History Narrative   Lives in [place], [place] with wife.    Social Determinants of Health   Financial Resource Strain: Not on file  Food Insecurity: Not on file  Transportation Needs: Not on file  Physical Activity: Not on file  Stress: Not on file  Social Connections: Not on file     Family History: The patient's family history includes Appendicitis in his brother; Cirrhosis in his brother; Dementia in his sister; Diabetes in his son; Edema in his sister; Heart disease in his mother, sister, sister, and sister; Heart disease (age of onset: 47) in his father; Heart failure in his sister; Hyperlipidemia in an other family member; Hypertension in his brother, father, sister, and another family member; Kidney disease in his brother; Leukemia in his brother; Myasthenia gravis in his brother; Tremor in his sister.  ROS:   Please see the history of present illness.     All other systems reviewed and are negative.  EKGs/Labs/Other Studies Reviewed:    The following studies were reviewed today: Echo 12/04/2019- IMPRESSIONS  1. Left ventricular ejection fraction, by estimation, is 35 to 40%. The  left ventricle has moderately decreased function. The left ventricle  demonstrates global hypokinesis. There is mild concentric left ventricular  hypertrophy. Left ventricular  diastolic parameters are consistent with Grade I diastolic dysfunction  (impaired relaxation).  2. Right ventricular systolic function is normal. The right ventricular  size is normal.  3. Left atrial size was severely dilated.  4. The mitral valve is normal in structure. Mild mitral valve  regurgitation. No evidence of mitral stenosis.  5. The aortic valve is tricuspid. Aortic valve regurgitation is mild. No  aortic stenosis is present.  6. Aortic dilatation noted. There is mild dilatation of the ascending  aorta, measuring 37 mm.   EKG:  EKG is not ordered today.  The ekg ordered 11/12/2019 demonstrates paced rhythm   Recent Labs: 08/26/2019: ALT 15 09/27/2019: TSH 1.91 01/22/2020: BUN 24; Creatinine, Ser 1.75; Hemoglobin 9.4; NT-Pro BNP 1,003; Platelets 57; Potassium 4.5; Sodium 143  Recent Lipid Panel    Component Value Date/Time   CHOL 119 09/27/2019 1400   TRIG 63 09/27/2019 1400   HDL 53 09/27/2019 1400   CHOLHDL 2.2 09/27/2019 1400   VLDL 23.2 01/01/2019 1011   LDLCALC 52 09/27/2019 1400   LDLDIRECT 81.6 12/30/2013 1213    Physical Exam:    VS:  BP 130/71   Pulse (!) 59   Temp (!) 96.8 F (36 C)   Ht 6' (1.829 m)   Wt 182 lb 3.2 oz (82.6 kg)   SpO2 95%   BMI 24.71 kg/m     Wt Readings from Last 3 Encounters:  01/29/20 182 lb 3.2 oz (82.6 kg)  11/12/19 184 lb (83.5 kg)  09/11/19 176 lb (79.8 kg)     GEN: frail, pale, elderly male-well developed in no acute distress HEENT: Normal-HOH NECK: No JVD CARDIAC: RRR, 2/6 MR murmur, rubs, gallops RESPIRATORY:  Few rales Lt base, otherwise clear ABDOMEN: Soft, non-tender, non-distended- large abdominal hernia MUSCULOSKELETAL:  3+ pre tibial pitting edema;  No deformity  SKIN: Warm and dry NEUROLOGIC:  Alert and oriented x 3 PSYCHIATRIC:  Normal affect   ASSESSMENT:    Acute on chronic combined systolic and diastolic CHF (congestive heart failure) (HCC) It appears his weight is up 10 lbs.  I have suggested torsemide 40 mg BID, f/u with labs in one week.  Increased SCr concerning- ? Cardio-renal syndrome.  He may need admission next week if labs and edema not improved.   Cardiomyopathy, ischemic EF 35-40% nov 2021- severe LAE  Implantable cardioverter-defibrillator (ICD) in situ Guident CRT-D '06- last gen change Sept 2019  Paroxysmal atrial fibrillation (HCC) Paced rhythm   Warfarin anticoagulation Coumadin therapy - INR today  PLAN:    Torsemide 40 mg BID- check BNP, BMP, CBC and OV next week.   Medication Adjustments/Labs and Tests Ordered: Current medicines are reviewed at length with the patient today.  Concerns regarding medicines are outlined above.  Orders Placed This Encounter  Procedures  . Basic Metabolic Panel (BMET)  . CBC  . B Nat Peptide   Meds ordered this encounter  Medications  . DISCONTD: torsemide (DEMADEX) 20 MG tablet    Sig: Take 2 tablets (40 mg total) by mouth 2 (two) times daily.    Dispense:  60 tablet    Refill:  6  . torsemide (DEMADEX) 20 MG tablet    Sig: Take 2 tablets (40 mg total) by mouth 2 (two) times daily.  Dispense:  120 tablet    Refill:  6    Disregard first rx    Patient Instructions  Medication Instructions:  STOP- Furosemide(Lasix) START- Torsemide 40 mg by mouth twice a day  *If you need a refill on your cardiac medications before your next appointment, please call your pharmacy*   Lab Work: CBC, BMP and BNP in 1 week  If you have labs (blood work) drawn today and your tests are completely normal, you will receive your results only by: [name] MyChart Message (if you have MyChart) OR . A paper copy in the mail If you have any lab test that is abnormal or we need to  change your treatment, we will call you to review the results.   Testing/Procedures: None ordered   Follow-Up: At [facility], you and your health needs are our priority.  As part of our continuing mission to provide you with exceptional heart care, we have created designated Provider Care Teams.  These Care Teams include your primary Cardiologist (physician) and Advanced Practice Providers (APPs -  Physician Assistants and Nurse Practitioners) who all work together to provide you with the care you need, when you need it.  We recommend signing up for the patient portal called "MyChart".  Sign up information is provided on this After Visit Summary.  MyChart is used to connect with patients for Virtual Visits (Telemedicine).  Patients are able to view lab/test results, encounter notes, upcoming appointments, etc.  Non-urgent messages can be sent to your provider as well.   To learn more about what you can do with MyChart, go to [email or web address].    Your next appointment:   Thursday January 6th @ 8:45 am  The format for your next appointment:   In Person  Provider:   You will see one of the following Advanced Practice Providers on your designated Care Team:    Kerin Ransom, PA-C          Signed, Kerin Ransom, [place]  01/29/2020 10:39 AM    [facility]

## 2020-01-29 NOTE — Assessment & Plan Note (Signed)
EF 35-40% nov 2021- severe LAE

## 2020-01-29 NOTE — Patient Instructions (Signed)
Medication Instructions:  STOP- Furosemide(Lasix) START- Torsemide 40 mg by mouth twice a day  *If you need a refill on your cardiac medications before your next appointment, please call your pharmacy*   Lab Work: CBC, BMP and BNP in 1 week  If you have labs (blood work) drawn today and your tests are completely normal, you will receive your results only by: [name] MyChart Message (if you have MyChart) OR . A paper copy in the mail If you have any lab test that is abnormal or we need to change your treatment, we will call you to review the results.   Testing/Procedures: None ordered   Follow-Up: At [facility], you and your health needs are our priority.  As part of our continuing mission to provide you with exceptional heart care, we have created designated Provider Care Teams.  These Care Teams include your primary Cardiologist (physician) and Advanced Practice Providers (APPs -  Physician Assistants and Nurse Practitioners) who all work together to provide you with the care you need, when you need it.  We recommend signing up for the patient portal called "MyChart".  Sign up information is provided on this After Visit Summary.  MyChart is used to connect with patients for Virtual Visits (Telemedicine).  Patients are able to view lab/test results, encounter notes, upcoming appointments, etc.  Non-urgent messages can be sent to your provider as well.   To learn more about what you can do with MyChart, go to [email or web address].    Your next appointment:   Thursday January 6th @ 8:45 am  The format for your next appointment:   In Person  Provider:   You will see one of the following Advanced Practice Providers on your designated Care Team:    Kerin Ransom, [place]

## 2020-01-29 NOTE — Assessment & Plan Note (Signed)
Coumadin therapy - INR today

## 2020-01-29 NOTE — Assessment & Plan Note (Signed)
Guident CRT-D '06- last gen change Sept 2019

## 2020-01-30 DIAGNOSIS — R69 Illness, unspecified: Secondary | ICD-10-CM | POA: Diagnosis not present

## 2020-02-05 DIAGNOSIS — Z951 Presence of aortocoronary bypass graft: Secondary | ICD-10-CM | POA: Diagnosis not present

## 2020-02-05 DIAGNOSIS — Z79899 Other long term (current) drug therapy: Secondary | ICD-10-CM | POA: Diagnosis not present

## 2020-02-05 DIAGNOSIS — I480 Paroxysmal atrial fibrillation: Secondary | ICD-10-CM | POA: Diagnosis not present

## 2020-02-05 DIAGNOSIS — Z95810 Presence of automatic (implantable) cardiac defibrillator: Secondary | ICD-10-CM | POA: Diagnosis not present

## 2020-02-05 DIAGNOSIS — I255 Ischemic cardiomyopathy: Secondary | ICD-10-CM | POA: Diagnosis not present

## 2020-02-05 DIAGNOSIS — R69 Illness, unspecified: Secondary | ICD-10-CM | POA: Diagnosis not present

## 2020-02-05 DIAGNOSIS — Z7901 Long term (current) use of anticoagulants: Secondary | ICD-10-CM | POA: Diagnosis not present

## 2020-02-06 ENCOUNTER — Telehealth: Payer: Self-pay | Admitting: Cardiology

## 2020-02-06 ENCOUNTER — Other Ambulatory Visit: Payer: Self-pay

## 2020-02-06 ENCOUNTER — Encounter: Payer: Self-pay | Admitting: Cardiology

## 2020-02-06 ENCOUNTER — Ambulatory Visit: Payer: Medicare HMO | Admitting: Cardiology

## 2020-02-06 VITALS — BP 115/62 | HR 61 | Temp 97.3°F | Ht 70.0 in | Wt 176.6 lb

## 2020-02-06 DIAGNOSIS — I5043 Acute on chronic combined systolic (congestive) and diastolic (congestive) heart failure: Secondary | ICD-10-CM

## 2020-02-06 DIAGNOSIS — Z95810 Presence of automatic (implantable) cardiac defibrillator: Secondary | ICD-10-CM

## 2020-02-06 DIAGNOSIS — I480 Paroxysmal atrial fibrillation: Secondary | ICD-10-CM

## 2020-02-06 DIAGNOSIS — I255 Ischemic cardiomyopathy: Secondary | ICD-10-CM

## 2020-02-06 DIAGNOSIS — N183 Chronic kidney disease, stage 3 unspecified: Secondary | ICD-10-CM | POA: Insufficient documentation

## 2020-02-06 DIAGNOSIS — Z7901 Long term (current) use of anticoagulants: Secondary | ICD-10-CM

## 2020-02-06 DIAGNOSIS — N179 Acute kidney failure, unspecified: Secondary | ICD-10-CM | POA: Insufficient documentation

## 2020-02-06 DIAGNOSIS — Z951 Presence of aortocoronary bypass graft: Secondary | ICD-10-CM

## 2020-02-06 DIAGNOSIS — Z79899 Other long term (current) drug therapy: Secondary | ICD-10-CM

## 2020-02-06 LAB — CBC
Hematocrit: 27 % — ABNORMAL LOW (ref 37.5–51.0)
Hemoglobin: 9 g/dL — ABNORMAL LOW (ref 13.0–17.7)
MCH: 30.8 pg (ref 26.6–33.0)
MCHC: 33.3 g/dL (ref 31.5–35.7)
MCV: 93 fL (ref 79–97)
Platelets: 54 10*3/uL — CL (ref 150–450)
RBC: 2.92 x10E6/uL — ABNORMAL LOW (ref 4.14–5.80)
RDW: 15 % (ref 11.6–15.4)
WBC: 5 10*3/uL (ref 3.4–10.8)

## 2020-02-06 LAB — BASIC METABOLIC PANEL
BUN/Creatinine Ratio: 13 (ref 10–24)
BUN: 28 mg/dL — ABNORMAL HIGH (ref 8–27)
CO2: 30 mmol/L — ABNORMAL HIGH (ref 20–29)
Calcium: 9 mg/dL (ref 8.6–10.2)
Chloride: 100 mmol/L (ref 96–106)
Creatinine, Ser: 2.09 mg/dL — ABNORMAL HIGH (ref 0.76–1.27)
GFR calc Af Amer: 32 mL/min/{1.73_m2} — ABNORMAL LOW (ref >59)
GFR calc non Af Amer: 28 mL/min/{1.73_m2} — ABNORMAL LOW (ref >59)
Glucose: 74 mg/dL (ref 65–99)
Potassium: 4 mmol/L (ref 3.5–5.2)
Sodium: 143 mmol/L (ref 134–144)

## 2020-02-06 LAB — BRAIN NATRIURETIC PEPTIDE: BNP: 136.2 pg/mL — ABNORMAL HIGH (ref 0.0–100.0)

## 2020-02-06 MED ORDER — TORSEMIDE 20 MG PO TABS: 40.0000 mg | ORAL_TABLET | Freq: Every day | ORAL | 6 refills | Status: DC

## 2020-02-06 NOTE — Assessment & Plan Note (Addendum)
Diuretics increased LOV. He is not SOB now and his weight is back to 176lbs.  He still has LE edema.

## 2020-02-06 NOTE — Assessment & Plan Note (Signed)
Guident CRT-D '06- last gen change Sept 2019

## 2020-02-06 NOTE — Assessment & Plan Note (Signed)
For PAF

## 2020-02-06 NOTE — Assessment & Plan Note (Addendum)
Paced

## 2020-02-06 NOTE — Progress Notes (Signed)
Cardiology Office Note:    Date:  02/06/2020   ID:  [name], DOB Dec 18, 1931, MRN [id number]  PCP:  [name] Lukes, MD  Cardiologist:  [name] Mocha, MD  Electrophysiologist:  [name] Axe, MD   Referring MD: [name] Lukes, MD   No chief complaint on file. F/U from 01/29/2020 OV  History of Present Illness:    [name] is a 87 y.o. male with a hx of chronic systolic heart failure, ischemic cardiomyopathy with an EF of 35 to 40% by echocardiogram November 2021, remote CABG 1994, history of ICD in place, last generator change was September 2019, PAFon Coumadin therapy, mild dementia and hard of hearing.  He was seen in the office 01/29/2020.  He had had increasing lower extremity edema and increasing weight.  His diuretics were increased as an outpatient with marginal results.  Lab done 01/22/2020 showed a BNP over thousand and a bump in his creatinine to 1.75.  I suggested he increase his torsemide to 40 mg twice daily.  We obtained follow-up labs and an office visit.  Labs done 02/05/2020 showed a BUN of 28 and a creatinine of 2.09.  BNP was ordered but never done by the lab.  He returns today for follow-up.  His daughter accompanied him.  The patient says his edema is somewhat better though he still has significant lower extremity edema.  He denies any orthopnea.  He says he is not any more short of breath than usual and his daughter confirms this.  He has been putting his support stockings on before he gets out of bed in the morning, but by midday his legs are still swollen.  Past Medical History:  Diagnosis Date  . AAA (abdominal aortic aneurysm) ([place])   . Abdominal pain 04/14/2011  . Acute bronchitis 09/02/2013  . Acute vascular insufficiency of intestine (HCC)    Mesenteric embolus...surgery 2003  . Allergic rhinitis 09/09/2013  . Anxiety   . Aortic atherosclerosis ([place]) 11/10/2015  . Atrial fibrillation (HCC)    paroxysmal  . Back pain 08/08/2016  . Biventricular  implantable cardiac defibrillator in situ    GDT CONTAK H170-MADIT-CRT-EXPLANTED 2011 implanted defibrillator- Guidant cognis, model n119-2001 Dr. Caryl Comes (11/28/2009)  . Breast tenderness    Spironolactone was stopped and eplerinone started. Patient feels better  . CAD (coronary artery disease)    CABG 1994  /   catheterization 1996, occluded vein graft to the diagonal, other grafts patent  /   catheterization 2006, no PCI  /  nuclear, October, 2011, extensive scar anteroseptal and apical, no ischemia  . Carotid artery disease ([place])    Doppler, February,  2012, 0-39% bilateral, recommended followup 1 year  . Chronic systolic heart failure (HCC)    chronic systolic   . Chronotropic incompetence   . Cirrhosis, nonalcoholic ([place]) 6/72/0947  . Colonic polyp 2010   pathology not clear.   . Crohn's disease ([place])   . Degenerative joint disease   . Dermatitis 04/05/2016  . Dizziness    Positional dizziness  . Edema leg    Venous Dopplers 8/13: No DVT bilaterally  . Ejection fraction    EF 35%, echo, November, 2011  //   Is 35-40%, echo,  September 23, 2011   . Fatigue 06/25/2008   Qualifier: Diagnosis of  By: [name], MD, Leonidas Romberg Dorinda Hill   . GERD 02/21/2007   Qualifier: Diagnosis of  By: Ronnald Ramp CNA/MA, Janett Billow    . Hyperglycemia 08/08/2016  . Hyperlipemia   .  Hypertension   . Hypertrophy of prostate with urinary obstruction and other lower urinary tract symptoms (LUTS)   . Hypoalbuminemia 06/16/2013  . Hypokalemia 06/18/2013  . Implantable cardioverter-defibrillator (ICD) in situ 12/09/2009   Annotation: EXPLANTED 2011   //   New Guidant ICD implanted 2011     . Iron deficiency anemia   . Ischemic cardiomyopathy    CABG 1994 CAD catherization 1996.[name] occluded vein graft to the diagnol, other grafts patent/  catherization 2006, no PCI/ nuclear.[name] october 2011.[name] extensive scar anteroseptal and apical.. no ischemia    . LBBB (left bundle branch block)   . MCI (mild cognitive impairment) 02/27/2017  .  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 Mitral regurgitation    mild.[name] echo november 2011  EF 35%...echo.. november 2009/ ef 35%.[name] echo november 2011  . Oral thrush 02/05/2015  . OSA (obstructive sleep apnea) 09/16/2011   NPSG 2013:  AHI 34/hr.    . Polymyalgia rheumatica ([place])   . Shingles   . Sinus bradycardia   . Skin cancer 08/08/2016  . Thrombocytopenia ([place]) 2008  . Thrush   . Tremor   . Ventral hernia    multiple, wears an abd binder, reports he hs been told he was at increased riisk for surgery  . Vitamin B12 deficiency   . Vitamin D deficiency 11/06/2008   Qualifier: Diagnosis of  By: Lenna Gilford MD, Deborra Medina   . Warfarin anticoagulation    Coumadin therapy for mesenteric embolus 2003    Past Surgical History:  Procedure Laterality Date  . BIV ICD GENERATOR CHANGEOUT N/A 10/23/2017   Procedure: BIV ICD GENERATOR CHANGEOUT;  Surgeon: Deboraha Sprang, MD;  Location: [facility];  Service: Cardiovascular;  Laterality: N/A;  . CATARACT EXTRACTION, BILATERAL  2014  . COLONOSCOPY N/A 04/19/2013   Procedure: COLONOSCOPY;  Surgeon: Jerene Bears, MD;  Location: [facility] ENDOSCOPY;  Service: Endoscopy;  Laterality: N/A;  . CORONARY ARTERY BYPASS GRAFT  1994  . Elap w/ superior mesenteric artery embolectomy  1/03   by Dr. Jennette Banker  . ESOPHAGOGASTRODUODENOSCOPY N/A 04/18/2013   Procedure: ESOPHAGOGASTRODUODENOSCOPY (EGD);  Surgeon: Jerene Bears, MD;  Location: [facility] ENDOSCOPY;  Service: Endoscopy;  Laterality: N/A;  . implantation of biventric cardioverter-defibrillatorr     by Dr. Lovena Le in 8/06 Guidant Contak H170-explanted 2011  . implantation of guidant cognis device     model n119-2011  . laparoscopic cholecystectomy  2002  . left inguinal hernia repair with mesh  6/08   by Dr. Betsy Pries  . PROSTATE SURGERY  08/2012   by urology in [place]  . Repair of incarcerated ventral hernia and lysis of adhesions  11/03   by Dr. Betsy Pries    Current Medications: Current Meds  Medication Sig  .  acetaminophen (TYLENOL) 650 MG CR tablet Take 650 mg by mouth daily as needed for pain.  . Ascorbic Acid (VITAMIN C) 100 MG tablet Take 100 mg by mouth daily.  [name] azelastine (ASTELIN) 0.1 % nasal spray Place 2 sprays into both nostrils 2 (two) times daily as needed for rhinitis or allergies. Use in each nostril as directed  . carvedilol (COREG) 6.25 MG tablet Take 1 tablet (6.25 mg total) by mouth 2 (two) times daily with a meal.  . cholecalciferol (VITAMIN D) 1000 UNITS tablet Take 1,000 Units by mouth every evening.  . cholestyramine (QUESTRAN) 4 g packet Take 1 packet (4 g total) by mouth daily.  [name] donepezil (ARICEPT) 10 MG tablet Take 1 tablet (10 mg  total) by mouth at bedtime.  . ferrous sulfate 325 (65 FE) MG tablet Take 325 mg by mouth daily with breakfast.  . hydrocortisone cream 1 % Apply 1 application topically daily as needed for itching.  [name] lisinopril (ZESTRIL) 10 MG tablet Take 1 tablet (10 mg total) by mouth daily.  . Loperamide HCl (LOPERAMIDE A-D PO) Take 1 tablet by mouth 2 (two) times daily as needed (Diarrhea).  . magnesium gluconate (MAGONATE) 500 MG tablet Take 500 mg by mouth as needed (cramps).  . memantine (NAMENDA) 10 MG tablet Take 1 tablet by mouth twice daily  . Menthol, Topical Analgesic, (BIOFREEZE EX) Apply 1 application topically daily as needed (pain).  . pantoprazole (PROTONIX) 40 MG tablet TAKE 1 TABLET BY MOUTH ONCE DAILY AT NOON  . potassium chloride SA (KLOR-CON) 20 MEQ tablet Take 1 tablet (20 mEq total) by mouth daily. Pt needs to make appt with provider for further refills - 1st attempt  . Simethicone (GAS-X MAXIMUM STRENGTH PO) Take 1-2 tablets by mouth daily as needed (Flatulence).  . simvastatin (ZOCOR) 20 MG tablet Take 1 tablet (20 mg total) by mouth at bedtime.  . vitamin B-12 (CYANOCOBALAMIN) 1000 MCG tablet Take 500 mcg by mouth daily.   [name] warfarin (COUMADIN) 5 MG tablet TAKE 1/2-1 TABLET DAILY  AS DIRECTED BY THE COUMADIN CLINIC  . [DISCONTINUED]  torsemide (DEMADEX) 20 MG tablet Take 2 tablets (40 mg total) by mouth 2 (two) times daily.     Allergies:   Patient has no known allergies.   Social History   Socioeconomic History  . Marital status: Married    Spouse name: Kennyth Lose  . Number of children: 3  . Years of education: Not on file  . Highest education level: Not on file  Occupational History  . Occupation: bull Actuary  . Occupation: farmer    Fish farm manager: RETIRED  . Occupation: logger  Tobacco Use  . Smoking status: Former Smoker    Packs/day: 1.00    Years: 20.00    Pack years: 20.00    Types: Cigarettes    Quit date: 01/31/1973    Years since quitting: 47.0  . Smokeless tobacco: Never Used  Vaping Use  . Vaping Use: Never used  Substance and Sexual Activity  . Alcohol use: Yes    Alcohol/week: 0.0 standard drinks    Comment: occasional  . Drug use: No  . Sexual activity: Never    Comment: lives with wife, no dietary restrictions  Other Topics Concern  . Not on file  Social History Narrative   Lives in [place], [place] with wife.    Social Determinants of Health   Financial Resource Strain: Not on file  Food Insecurity: Not on file  Transportation Needs: Not on file  Physical Activity: Not on file  Stress: Not on file  Social Connections: Not on file     Family History: The patient's family history includes Appendicitis in his brother; Cirrhosis in his brother; Dementia in his sister; Diabetes in his son; Edema in his sister; Heart disease in his mother, sister, sister, and sister; Heart disease (age of onset: 32) in his father; Heart failure in his sister; Hyperlipidemia in an other family member; Hypertension in his brother, father, sister, and another family member; Kidney disease in his brother; Leukemia in his brother; Myasthenia gravis in his brother; Tremor in his sister.  ROS:   Please see the history of present illness.     All other systems reviewed and are  negative.  EKGs/Labs/Other Studies  Reviewed:    The following studies were reviewed today: Echo 03/00/9233- chronic systolic heart failure, ischemic cardiomyopathy with an EF of 35 to 40% by echocardiogram November 2021, remote CABG 1994, history of ICD in place, last generator change was September 2019, PAF, Coumadin therapy, mild dementia and hard of hearing.  EKG:  EKG is not ordered today.  The ekg ordered 11/12/2019 demonstrates paced rhythm.   Recent Labs: 08/26/2019: ALT 15 09/27/2019: TSH 1.91 01/22/2020: NT-Pro BNP 1,003 02/05/2020: BNP WILL FOLLOW; BUN 28; Creatinine, Ser 2.09; Hemoglobin 9.0; Platelets 54; Potassium 4.0; Sodium 143  Recent Lipid Panel    Component Value Date/Time   CHOL 119 09/27/2019 1400   TRIG 63 09/27/2019 1400   HDL 53 09/27/2019 1400   CHOLHDL 2.2 09/27/2019 1400   VLDL 23.2 01/01/2019 1011   LDLCALC 52 09/27/2019 1400   LDLDIRECT 81.6 12/30/2013 1213    Physical Exam:    VS:  BP 115/62   Pulse 61   Temp (!) 97.3 F (36.3 C)   Ht 5' 10"  (1.778 m)   Wt 176 lb 9.6 oz (80.1 kg)   SpO2 95%   BMI 25.34 kg/m     Wt Readings from Last 3 Encounters:  02/06/20 176 lb 9.6 oz (80.1 kg)  01/29/20 182 lb 3.2 oz (82.6 kg)  11/12/19 184 lb (83.5 kg)     GEN: Elderly male, well developed in no acute distress HEENT: Normal-HOH NECK: No JVD LYMPHATICS: No lymphadenopathy CARDIAC: RRR, no murmurs, rubs, gallops RESPIRATORY:  Clear to auscultation without rales, wheezing or rhonchi  ABDOMEN: Soft, non-distended MUSCULOSKELETAL:  Bilateral LE edema, Lt-2+/4, Rt 1+/4.   No deformity  SKIN: Warm and dry NEUROLOGIC:  Alert and oriented x 3 PSYCHIATRIC:  Normal affect   ASSESSMENT:    Acute on chronic combined systolic and diastolic CHF (congestive heart failure) (HCC) Diuretics increased LOV. He is not SOB now and his weight is back to 176lbs.  He still has LE edema.  Cardiomyopathy, ischemic EF 35-40% nov 2021- severe LAE  Hx of CABG CABG 1994  /   catheterization 1996, occluded  vein graft to the diagonal, other grafts patent  /   catheterization 2006, no PCI  /  nuclear, October, 2011, extensive scar anteroseptal and apical, no ischemia  Implantable cardioverter-defibrillator (ICD) in situ Guident CRT-D '06- last gen change Sept 2019  Paroxysmal atrial fibrillation (HCC) Paced   Warfarin anticoagulation For PAF  AKI (acute kidney injury) ([place]) Jump in Scr to 2.0 with increased diuretics.  I think we have pushed as hard as we can as an OP.  He does not have increased SOB or orthopnea.  Decrease Torsemide to 40 mg daily.  If his weight goes > 180 lbs he can increase to BID till he gets back to 175 lbs.  If he develops increasing dyspnea I think he will need to be admitted to manage.   PLAN:    I suggested they decrease his torsemide to 40 mg daily now that his weight is back to baseline (176 lbs) and his SCr jumped to 2.0.  I told them to go ahead and increase the torsemide to 40 mg BID for a few days if his weight goes above 180 lbs.  If he develops orthopnea or increased DOE I suspect he will need admission for management.  I ordered a f/u BMP in one week.      Medication Adjustments/Labs and Tests Ordered: Current medicines are reviewed  at length with the patient today.  Concerns regarding medicines are outlined above.  Orders Placed This Encounter  Procedures  . Basic metabolic panel   Meds ordered this encounter  Medications  . torsemide (DEMADEX) 20 MG tablet    Sig: Take 2 tablets (40 mg total) by mouth daily.    Dispense:  60 tablet    Refill:  6    Disregard first rx    Patient Instructions  Medication Instructions:  DECREASE TORSEMIDE 40MG DAILY WEIGH DAILY GOAL IS 175 lbs *If you need a refill on your cardiac medications before your next appointment, please call your pharmacy*  Lab Work: BMET IN 10 DAYS 02-16-20 THIS IS NOT FASTING If you have labs (blood work) drawn today and your tests are completely normal, you will receive your results  only by:  [place] (if you have MyChart) OR A paper copy in the mail.  If you have any lab test that is abnormal or we need to change your treatment, we will call you to review the results. You may go to any [facility] that is convenient for you however, we do have a lab in our office that is able to assist you. You DO NOT need an appointment for our lab. The lab is open 8:00am and closes at 4:00pm. Lunch 12:45 - 1:45pm.  Follow-Up: Your next appointment:  6-8 week(s) In Person with [name] Mocha, MD At [facility], you and your health needs are our priority.  As part of our continuing mission to provide you with exceptional heart care, we have created designated Provider Care Teams.  These Care Teams include your primary Cardiologist (physician) and Advanced Practice Providers (APPs -  Physician Assistants and Nurse Practitioners) who all work together to provide you with the care you need, when you need it.      Angelena Form, PA-C  02/06/2020 11:55 AM    [place]

## 2020-02-06 NOTE — Assessment & Plan Note (Signed)
EF 35-40% nov 2021- severe LAE

## 2020-02-06 NOTE — Assessment & Plan Note (Addendum)
Jump in Scr to 2.0 with increased diuretics.  I think we have pushed as hard as we can as an OP.  He does not have increased SOB or orthopnea.  Decrease Torsemide to 40 mg daily.  If his weight goes > 180 lbs he can increase to BID till he gets back to 175 lbs.  If he develops increasing dyspnea I think he will need to be admitted to manage.

## 2020-02-06 NOTE — Assessment & Plan Note (Signed)
CABG 1994  /   catheterization 1996, occluded vein graft to the diagonal, other grafts patent  /   catheterization 2006, no PCI  /  nuclear, October, 2011, extensive scar anteroseptal and apical, no ischemia

## 2020-02-06 NOTE — Patient Instructions (Signed)
Medication Instructions:  DECREASE TORSEMIDE 40MG DAILY WEIGH DAILY GOAL IS 175 lbs *If you need a refill on your cardiac medications before your next appointment, please call your pharmacy*  Lab Work: BMET IN 10 DAYS 02-16-20 THIS IS NOT FASTING If you have labs (blood work) drawn today and your tests are completely normal, you will receive your results only by:  [place] (if you have MyChart) OR A paper copy in the mail.  If you have any lab test that is abnormal or we need to change your treatment, we will call you to review the results. You may go to any [facility] that is convenient for you however, we do have a lab in our office that is able to assist you. You DO NOT need an appointment for our lab. The lab is open 8:00am and closes at 4:00pm. Lunch 12:45 - 1:45pm.  Follow-Up: Your next appointment:  6-8 week(s) In Person with Sherren Mocha, MD At [facility], you and your health needs are our priority.  As part of our continuing mission to provide you with exceptional heart care, we have created designated Provider Care Teams.  These Care Teams include your primary Cardiologist (physician) and Advanced Practice Providers (APPs -  Physician Assistants and Nurse Practitioners) who all work together to provide you with the care you need, when you need it.

## 2020-02-06 NOTE — Telephone Encounter (Signed)
I was supposed to see [name] in follow-up in the office today.  I called his home number and left instructions for him to decrease his diuretic to once a day.  We will arrange for a follow-up BMP in 2 weeks.  Kerin Ransom PA-C 02/06/2020 9:06 AM

## 2020-02-13 ENCOUNTER — Telehealth: Payer: Self-pay | Admitting: Cardiovascular Disease

## 2020-02-13 NOTE — Telephone Encounter (Signed)
The patient's wife is concerned because the patient is sick. He is sneezing and coughing and his oxygen saturations are fluctuating. She states she was sick a few days ago and thinks he has was she had. She reports she is worried about him getting PNA because he's tired and wants to lie down a lot, and then his oxygenation decreases. The Fire Department went out to their home last night and checked on him. They gave him some oxygen and his sats came up.  Encouraged the patient's wife several times to take him to either Urgent Care or to contact PCP. Reiterated to her that he may need a Covid test or antibiotics depending on what is wrong. She reports she does not want to take him to Urgent Care because of the wait. Encouraged her to call Dr. Charlett Blake first thing in the morning for recommendations and to seek immediate medical attention if symptoms worsen prior to morning. She was grateful for call and agrees with plan.

## 2020-02-13 NOTE — Telephone Encounter (Signed)
Patient's wife following up. Would really like a call back today states her husband may be dead by tomorrow. Please advise.

## 2020-02-13 NOTE — Telephone Encounter (Signed)
He needs to be evaluated he should definitely get a covid test please tell them how to get set up with a covid test with [facility] and I can do a virtual visit with him Friday mid day. If he is getting worse and especially if his oxygen is dropping to the 80s and not improving he will need to get seen more urgently.

## 2020-02-13 NOTE — Telephone Encounter (Signed)
    Pt's wife calling wanted to speak with Dr. Antionette Char nurse, she said her and her son is a little sick but not covid, and pt has been very weak and every time he wanted to lay down she is concerned that pt will have pneumonia. She would like to speak with Valetta Fuller as soon as possible

## 2020-02-14 ENCOUNTER — Other Ambulatory Visit: Payer: Self-pay

## 2020-02-14 ENCOUNTER — Telehealth (INDEPENDENT_AMBULATORY_CARE_PROVIDER_SITE_OTHER): Payer: Medicare HMO | Admitting: Family Medicine

## 2020-02-14 ENCOUNTER — Encounter: Payer: Self-pay | Admitting: Family Medicine

## 2020-02-14 DIAGNOSIS — R059 Cough, unspecified: Secondary | ICD-10-CM | POA: Diagnosis not present

## 2020-02-14 DIAGNOSIS — I10 Essential (primary) hypertension: Secondary | ICD-10-CM | POA: Diagnosis not present

## 2020-02-14 DIAGNOSIS — B349 Viral infection, unspecified: Secondary | ICD-10-CM | POA: Diagnosis not present

## 2020-02-14 MED ORDER — ALBUTEROL SULFATE HFA 108 (90 BASE) MCG/ACT IN AERS: 2.0000 | INHALATION_SPRAY | RESPIRATORY_TRACT | 1 refills | Status: AC | PRN

## 2020-02-14 MED ORDER — AMOXICILLIN 500 MG PO CAPS: 500.0000 mg | ORAL_CAPSULE | Freq: Three times a day (TID) | ORAL | 0 refills | Status: DC

## 2020-02-14 MED ORDER — FLOVENT HFA 220 MCG/ACT IN AERO: 1.0000 | INHALATION_SPRAY | Freq: Two times a day (BID) | RESPIRATORY_TRACT | 1 refills | Status: AC

## 2020-02-14 MED ORDER — BENZONATATE 100 MG PO CAPS: 100.0000 mg | ORAL_CAPSULE | Freq: Three times a day (TID) | ORAL | 0 refills | Status: DC | PRN

## 2020-02-14 NOTE — Telephone Encounter (Signed)
If she is willing she should run the home test prior to the visit because if it is positive it is positive. If it is negative that is when we question the results and have to talk about other testing. It would really help [place] decide next steps.

## 2020-02-14 NOTE — Telephone Encounter (Signed)
Spoke with wife, Kennyth Lose.  She reports patient has cough, fatigue and low grade temp (99 degrees). Pulse ox currently 93%.  She does not feel that she can get patient out of home to get tested for covid. They do have access to home test kit however she questions accuracy.  Patient scheduled for VV with PCP today at 1140.

## 2020-02-14 NOTE — Progress Notes (Signed)
I connected with  [name] on 02/14/20 by a video enabled telemedicine application and verified that I am speaking with the correct person using two identifiers.   I discussed the limitations of evaluation and management by telemedicine. The patient expressed understanding and agreed to proceed.

## 2020-02-14 NOTE — Telephone Encounter (Signed)
TY

## 2020-02-15 ENCOUNTER — Other Ambulatory Visit: Payer: Self-pay | Admitting: Family Medicine

## 2020-02-16 DIAGNOSIS — B349 Viral infection, unspecified: Secondary | ICD-10-CM | POA: Insufficient documentation

## 2020-02-16 NOTE — Assessment & Plan Note (Signed)
Well controlled, no changes to meds. Encouraged heart healthy diet such as the DASH diet and exercise as tolerated.  °

## 2020-02-16 NOTE — Assessment & Plan Note (Addendum)
His daughter was able to come over and administer a COVID test during visit and it was negative. They report he is feeling better today than yesterday but he still feels poorly and does not feel he wants to go out. An xray is ordered for him to do if he does not continue to improve. They will monitor his symptoms and vitals closely. If his blood pressure drops or his oxygen drops they will have to seek care. If his fever elevates or he has a change in his mental status they will also have to seek care. Encouraged increased rest and hydration, add probiotics, zinc such as Coldeze or Xicam. Treat fevers as needed. Sent in some Tessalon Perles to use prn cough, Flovent 222 to use 1 puff po bid and Albuterol to use prn. Also given a prescription for Amoxicillin to use in case he has a bacterial component. Spent 25 minutes discussing symptoms plan of care.

## 2020-02-16 NOTE — Progress Notes (Signed)
Patient ID: [name], male   DOB: 07-Aug-1931, 87 y.o.   MRN: [id number] Virtual Visit via phone Note  I connected with [name] on 02/14/20 at 11:40 AM EST by a phone enabled telemedicine application and verified that I am speaking with the correct person using two identifiers.  Location: Patient: home, patient, his wife, his daughter and the provider are in visit Provider: office   I discussed the limitations of evaluation and management by telemedicine and the availability of in person appointments. The patient expressed understanding and agreed to proceed. Baldomero Lamy, CMA was able to get the patient set up on a phone visit after being unable to set up a video visit   Subjective:    Patient ID: [name], male    DOB: 05-12-1931, 87 y.o.   MRN: [id number]  Chief Complaint  Patient presents with  . URI    Coughing and some fatigue. When lying down it makes oxygen go down. He is better today than yesterday. Having symptoms last 3 days and coughing up mucus. Ran out of mucinex.    HPI Patient is in today for evaluation of respiratory illness. He has been ill roughly 2 days and they report he is some better today than he was yesterday. He notes nasal congestion, cough, fevers, malaise, anorexia. Denies headaches or other GI symptoms.   Past Medical History:  Diagnosis Date  . AAA (abdominal aortic aneurysm) ([place])   . Abdominal pain 04/14/2011  . Acute bronchitis 09/02/2013  . Acute vascular insufficiency of intestine (HCC)    Mesenteric embolus...surgery 2003  . Allergic rhinitis 09/09/2013  . Anxiety   . Aortic atherosclerosis ([place]) 11/10/2015  . Atrial fibrillation (HCC)    paroxysmal  . Back pain 08/08/2016  . Biventricular implantable cardiac defibrillator in situ    GDT CONTAK H170-MADIT-CRT-EXPLANTED 2011 implanted defibrillator- Guidant cognis, model n119-2001 Dr. Caryl Comes (11/28/2009)  . Breast tenderness    Spironolactone was stopped and eplerinone started. Patient  feels better  . CAD (coronary artery disease)    CABG 1994  /   catheterization 1996, occluded vein graft to the diagonal, other grafts patent  /   catheterization 2006, no PCI  /  nuclear, October, 2011, extensive scar anteroseptal and apical, no ischemia  . Carotid artery disease ([place])    Doppler, February,  2012, 0-39% bilateral, recommended followup 1 year  . Chronic systolic heart failure (HCC)    chronic systolic   . Chronotropic incompetence   . Cirrhosis, nonalcoholic ([place]) 09/07/8108  . Colonic polyp 2010   pathology not clear.   . Crohn's disease ([place])   . Degenerative joint disease   . Dermatitis 04/05/2016  . Dizziness    Positional dizziness  . Edema leg    Venous Dopplers 8/13: No DVT bilaterally  . Ejection fraction    EF 35%, echo, November, 2011  //   Is 35-40%, echo,  September 23, 2011   . Fatigue 06/25/2008   Qualifier: Diagnosis of  By: [name], MD, Leonidas Romberg Dorinda Hill   . GERD 02/21/2007   Qualifier: Diagnosis of  By: Ronnald Ramp CNA/MA, Janett Billow    . Hyperglycemia 08/08/2016  . Hyperlipemia   . Hypertension   . Hypertrophy of prostate with urinary obstruction and other lower urinary tract symptoms (LUTS)   . Hypoalbuminemia 06/16/2013  . Hypokalemia 06/18/2013  . Implantable cardioverter-defibrillator (ICD) in situ 12/09/2009   Annotation: EXPLANTED 2011   //   New Guidant ICD implanted 2011     .  [place] deficiency anemia   . Ischemic cardiomyopathy    CABG 1994 CAD catherization 1996.[name] occluded vein graft to the diagnol, other grafts patent/  catherization 2006, no PCI/ nuclear.[name] october 2011.[name] extensive scar anteroseptal and apical.. no ischemia    . LBBB (left bundle branch block)   . MCI (mild cognitive impairment) 02/27/2017  . 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 Mitral regurgitation    mild.[name] echo november 2011  EF 35%...echo.. november 2009/ ef 35%.[name] echo november 2011  . Oral thrush 02/05/2015  . OSA (obstructive sleep apnea) 09/16/2011   NPSG 2013:  AHI 34/hr.    .  Polymyalgia rheumatica ([place])   . Shingles   . Sinus bradycardia   . Skin cancer 08/08/2016  . Thrombocytopenia ([place]) 2008  . Thrush   . Tremor   . Ventral hernia    multiple, wears an abd binder, reports he hs been told he was at increased riisk for surgery  . Vitamin B12 deficiency   . Vitamin D deficiency 11/06/2008   Qualifier: Diagnosis of  By: Lenna Gilford MD, Deborra Medina   . Warfarin anticoagulation    Coumadin therapy for mesenteric embolus 2003    Past Surgical History:  Procedure Laterality Date  . BIV ICD GENERATOR CHANGEOUT N/A 10/23/2017   Procedure: BIV ICD GENERATOR CHANGEOUT;  Surgeon: Deboraha Sprang, MD;  Location: [facility];  Service: Cardiovascular;  Laterality: N/A;  . CATARACT EXTRACTION, BILATERAL  2014  . COLONOSCOPY N/A 04/19/2013   Procedure: COLONOSCOPY;  Surgeon: Jerene Bears, MD;  Location: [facility] ENDOSCOPY;  Service: Endoscopy;  Laterality: N/A;  . CORONARY ARTERY BYPASS GRAFT  1994  . Elap w/ superior mesenteric artery embolectomy  1/03   by Dr. Jennette Banker  . ESOPHAGOGASTRODUODENOSCOPY N/A 04/18/2013   Procedure: ESOPHAGOGASTRODUODENOSCOPY (EGD);  Surgeon: Jerene Bears, MD;  Location: [facility] ENDOSCOPY;  Service: Endoscopy;  Laterality: N/A;  . implantation of biventric cardioverter-defibrillatorr     by Dr. Lovena Le in 8/06 Guidant Contak H170-explanted 2011  . implantation of guidant cognis device     model n119-2011  . laparoscopic cholecystectomy  2002  . left inguinal hernia repair with mesh  6/08   by Dr. Betsy Pries  . PROSTATE SURGERY  08/2012   by urology in [place]  . Repair of incarcerated ventral hernia and lysis of adhesions  11/03   by Dr. Betsy Pries    Family History  Problem Relation Age of Onset  . Heart disease Mother   . Tremor Sister   . Heart failure Sister   . Heart disease Father 25  . Hypertension Father   . Appendicitis Brother        ruptured  . Diabetes Son   . Heart disease Sister   . Hypertension Sister   . Dementia Sister   . Heart  disease Sister   . Hypertension Brother   . Kidney disease Brother   . Myasthenia gravis Brother   . Cirrhosis Brother   . Leukemia Brother   . Hypertension Other   . Hyperlipidemia Other   . Heart disease Sister   . Edema Sister        pedal edema    Social History   Socioeconomic History  . Marital status: Married    Spouse name: Kennyth Lose  . Number of children: 3  . Years of education: Not on file  . Highest education level: Not on file  Occupational History  . Occupation: bull Actuary  . Occupation: farmer  Employer: RETIRED  . Occupation: logger  Tobacco Use  . Smoking status: Former Smoker    Packs/day: 1.00    Years: 20.00    Pack years: 20.00    Types: Cigarettes    Quit date: 01/31/1973    Years since quitting: 47.0  . Smokeless tobacco: Never Used  Vaping Use  . Vaping Use: Never used  Substance and Sexual Activity  . Alcohol use: Yes    Alcohol/week: 0.0 standard drinks    Comment: occasional  . Drug use: No  . Sexual activity: Never    Comment: lives with wife, no dietary restrictions  Other Topics Concern  . Not on file  Social History Narrative   Lives in [place], [place] with wife.    Social Determinants of Health   Financial Resource Strain: Not on file  Food Insecurity: Not on file  Transportation Needs: Not on file  Physical Activity: Not on file  Stress: Not on file  Social Connections: Not on file  Intimate Partner Violence: Not on file    Outpatient Medications Prior to Visit  Medication Sig Dispense Refill  . Ascorbic Acid (VITAMIN C) 100 MG tablet Take 100 mg by mouth daily.    [name] azelastine (ASTELIN) 0.1 % nasal spray Place 2 sprays into both nostrils 2 (two) times daily as needed for rhinitis or allergies. Use in each nostril as directed 30 mL 1  . carvedilol (COREG) 6.25 MG tablet Take 1 tablet (6.25 mg total) by mouth 2 (two) times daily with a meal. 180 tablet 1  . cholecalciferol (VITAMIN D) 1000 UNITS tablet Take 1,000 Units  by mouth every evening.    . cholestyramine (QUESTRAN) 4 g packet Take 1 packet (4 g total) by mouth daily. 30 each 3  . donepezil (ARICEPT) 10 MG tablet Take 1 tablet (10 mg total) by mouth at bedtime. 90 tablet 0  . ferrous sulfate 325 (65 FE) MG tablet Take 325 mg by mouth daily with breakfast.    . hydrocortisone cream 1 % Apply 1 application topically daily as needed for itching.    [name] lisinopril (ZESTRIL) 10 MG tablet Take 1 tablet (10 mg total) by mouth daily. 90 tablet 2  . Loperamide HCl (LOPERAMIDE A-D PO) Take 1 tablet by mouth 2 (two) times daily as needed (Diarrhea).    . magnesium gluconate (MAGONATE) 500 MG tablet Take 500 mg by mouth as needed (cramps).    . memantine (NAMENDA) 10 MG tablet Take 1 tablet by mouth twice daily 60 tablet 0  . Menthol, Topical Analgesic, (BIOFREEZE EX) Apply 1 application topically daily as needed (pain).    . pantoprazole (PROTONIX) 40 MG tablet TAKE 1 TABLET BY MOUTH ONCE DAILY AT NOON 90 tablet 3  . potassium chloride SA (KLOR-CON) 20 MEQ tablet Take 1 tablet (20 mEq total) by mouth daily. Pt needs to make appt with provider for further refills - 1st attempt 90 tablet 1  . Simethicone (GAS-X MAXIMUM STRENGTH PO) Take 1-2 tablets by mouth daily as needed (Flatulence).    . simvastatin (ZOCOR) 20 MG tablet Take 1 tablet (20 mg total) by mouth at bedtime. 90 tablet 1  . torsemide (DEMADEX) 20 MG tablet Take 2 tablets (40 mg total) by mouth daily. 60 tablet 6  . vitamin B-12 (CYANOCOBALAMIN) 1000 MCG tablet Take 500 mcg by mouth daily.     [name] warfarin (COUMADIN) 5 MG tablet TAKE 1/2-1 TABLET DAILY  AS DIRECTED BY THE COUMADIN CLINIC 90 tablet 1  .  acetaminophen (TYLENOL) 650 MG CR tablet Take 650 mg by mouth daily as needed for pain. (Patient not taking: Reported on 02/14/2020)     No facility-administered medications prior to visit.    No Known Allergies  Review of Systems  Constitutional: Positive for fever and malaise/fatigue. Negative for chills.   HENT: Positive for congestion and sinus pain.   Eyes: Negative for blurred vision.  Respiratory: Positive for cough and sputum production. Negative for shortness of breath.   Cardiovascular: Negative for chest pain, palpitations and leg swelling.  Gastrointestinal: Negative for nausea and vomiting.  Musculoskeletal: Positive for joint pain and myalgias. Negative for back pain.  Skin: Negative for rash.  Neurological: Negative for loss of consciousness and headaches.       Objective:    Physical Exam unable to obtain via phone visit  There were no vitals taken for this visit. Wt Readings from Last 3 Encounters:  02/06/20 176 lb 9.6 oz (80.1 kg)  01/29/20 182 lb 3.2 oz (82.6 kg)  11/12/19 184 lb (83.5 kg)    Diabetic Foot Exam - Simple   No data filed    Lab Results  Component Value Date   WBC 5.0 02/05/2020   HGB 9.0 (L) 02/05/2020   HCT 27.0 (L) 02/05/2020   PLT 54 (LL) 02/05/2020   GLUCOSE 74 02/05/2020   CHOL 119 09/27/2019   TRIG 63 09/27/2019   HDL 53 09/27/2019   LDLDIRECT 81.6 12/30/2013   LDLCALC 52 09/27/2019   ALT 15 08/26/2019   AST 31 08/26/2019   NA 143 02/05/2020   K 4.0 02/05/2020   CL 100 02/05/2020   CREATININE 2.09 (H) 02/05/2020   BUN 28 (H) 02/05/2020   CO2 30 (H) 02/05/2020   TSH 1.91 09/27/2019   PSA 2.93 11/24/2010   INR 2.5 01/29/2020   HGBA1C 5.0 08/26/2019    Lab Results  Component Value Date   TSH 1.91 09/27/2019   Lab Results  Component Value Date   WBC 5.0 02/05/2020   HGB 9.0 (L) 02/05/2020   HCT 27.0 (L) 02/05/2020   MCV 93 02/05/2020   PLT 54 (LL) 02/05/2020   Lab Results  Component Value Date   NA 143 02/05/2020   K 4.0 02/05/2020   CO2 30 (H) 02/05/2020   GLUCOSE 74 02/05/2020   BUN 28 (H) 02/05/2020   CREATININE 2.09 (H) 02/05/2020   BILITOT 1.9 (H) 08/26/2019   ALKPHOS 74 08/26/2019   AST 31 08/26/2019   ALT 15 08/26/2019   PROT 5.0 (L) 08/26/2019   ALBUMIN 3.3 (L) 08/26/2019   CALCIUM 9.0 02/05/2020    ANIONGAP 8 08/17/2019   GFR 47.24 (L) 08/26/2019   Lab Results  Component Value Date   CHOL 119 09/27/2019   Lab Results  Component Value Date   HDL 53 09/27/2019   Lab Results  Component Value Date   LDLCALC 52 09/27/2019   Lab Results  Component Value Date   TRIG 63 09/27/2019   Lab Results  Component Value Date   CHOLHDL 2.2 09/27/2019   Lab Results  Component Value Date   HGBA1C 5.0 08/26/2019       Assessment & Plan:   Problem List Items Addressed This Visit    Hypertension    Well controlled, no changes to meds. Encouraged heart healthy diet such as the DASH diet and exercise as tolerated.       Viral illness    His daughter was able to come over and administer  a COVID test during visit and it was negative. They report he is feeling better today than yesterday but he still feels poorly and does not feel he wants to go out. An xray is ordered for him to do if he does not continue to improve. They will monitor his symptoms and vitals closely. If his blood pressure drops or his oxygen drops they will have to seek care. If his fever elevates or he has a change in his mental status they will also have to seek care. Encouraged increased rest and hydration, add probiotics, zinc such as Coldeze or Xicam. Treat fevers as needed. Sent in some Tessalon Perles to use prn cough, Flovent 222 to use 1 puff po bid and Albuterol to use prn. Also given a prescription for Amoxicillin to use in case he has a bacterial component. Spent 20 minutes discussing symptoms plan of care.        Other Visit Diagnoses    Cough in adult    -  Primary   Relevant Orders   DG Chest 2 View      I am having [name] "[name]" start on albuterol, Flovent HFA, amoxicillin, and benzonatate. I am also having him maintain his cholecalciferol, vitamin B-12, hydrocortisone cream, ferrous sulfate, vitamin C, azelastine, cholestyramine, magnesium gluconate, pantoprazole, Simethicone (GAS-X MAXIMUM  STRENGTH PO), Loperamide HCl (LOPERAMIDE A-D PO), acetaminophen, (Menthol, Topical Analgesic, (BIOFREEZE EX)), simvastatin, potassium chloride SA, lisinopril, donepezil, carvedilol, warfarin, memantine, and torsemide.  Meds ordered this encounter  Medications  . albuterol (VENTOLIN HFA) 108 (90 Base) MCG/ACT inhaler    Sig: Inhale 2 puffs into the lungs every 4 (four) hours as needed for wheezing or shortness of breath.    Dispense:  18 g    Refill:  1  . fluticasone (FLOVENT HFA) 220 MCG/ACT inhaler    Sig: Inhale 1 puff into the lungs in the morning and at bedtime.    Dispense:  1 each    Refill:  1  . amoxicillin (AMOXIL) 500 MG capsule    Sig: Take 1 capsule (500 mg total) by mouth 3 (three) times daily.    Dispense:  21 capsule    Refill:  0  . benzonatate (TESSALON PERLES) 100 MG capsule    Sig: Take 1 capsule (100 mg total) by mouth 3 (three) times daily as needed for cough.    Dispense:  20 capsule    Refill:  0     I discussed the assessment and treatment plan with the patient. The patient was provided an opportunity to ask questions and all were answered. The patient agreed with the plan and demonstrated an understanding of the instructions.   The patient was advised to call back or seek an in-person evaluation if the symptoms worsen or if the condition fails to improve as anticipated.  I provided 22 minutes of non-face-to-face time during this encounter.   Penni Homans, MD

## 2020-02-20 ENCOUNTER — Ambulatory Visit (INDEPENDENT_AMBULATORY_CARE_PROVIDER_SITE_OTHER): Payer: Medicare HMO

## 2020-02-20 DIAGNOSIS — I255 Ischemic cardiomyopathy: Secondary | ICD-10-CM | POA: Diagnosis not present

## 2020-02-20 DIAGNOSIS — I5022 Chronic systolic (congestive) heart failure: Secondary | ICD-10-CM

## 2020-02-20 LAB — CUP PACEART REMOTE DEVICE CHECK
Battery Remaining Longevity: 90 mo
Battery Remaining Percentage: 100 %
Brady Statistic RA Percent Paced: 92 %
Brady Statistic RV Percent Paced: 96 %
Date Time Interrogation Session: 20220120091000
HighPow Impedance: 38 Ohm
Implantable Lead Implant Date: 20060808
Implantable Lead Implant Date: 20060808
Implantable Lead Implant Date: 20060911
Implantable Lead Location: 753858
Implantable Lead Location: 753859
Implantable Lead Location: 753860
Implantable Lead Model: 158
Implantable Lead Model: 4087
Implantable Lead Serial Number: 165392
Implantable Lead Serial Number: 261368
Implantable Pulse Generator Implant Date: 20190923
Lead Channel Impedance Value: 378 Ohm
Lead Channel Impedance Value: 425 Ohm
Lead Channel Impedance Value: 440 Ohm
Lead Channel Setting Pacing Amplitude: 2 V
Lead Channel Setting Pacing Amplitude: 2.3 V
Lead Channel Setting Pacing Amplitude: 2.4 V
Lead Channel Setting Pacing Pulse Width: 0.4 ms
Lead Channel Setting Pacing Pulse Width: 0.8 ms
Lead Channel Setting Sensing Sensitivity: 0.5 mV
Lead Channel Setting Sensing Sensitivity: 1 mV
Pulse Gen Serial Number: 134188

## 2020-02-24 ENCOUNTER — Other Ambulatory Visit: Payer: Self-pay | Admitting: Family Medicine

## 2020-03-03 ENCOUNTER — Ambulatory Visit (INDEPENDENT_AMBULATORY_CARE_PROVIDER_SITE_OTHER): Payer: Medicare HMO

## 2020-03-03 DIAGNOSIS — I5022 Chronic systolic (congestive) heart failure: Secondary | ICD-10-CM | POA: Diagnosis not present

## 2020-03-03 DIAGNOSIS — Z95810 Presence of automatic (implantable) cardiac defibrillator: Secondary | ICD-10-CM | POA: Diagnosis not present

## 2020-03-03 NOTE — Progress Notes (Signed)
Remote ICD transmission.   

## 2020-03-06 NOTE — Progress Notes (Signed)
EPIC Encounter for ICM Monitoring  Patient Name: FAHIM KATS is a 87 y.o. male Date: 03/06/2020 Primary Care Physican: [name], MD Primary Cardiologist:Cooper/Weaver, PA Electrophysiologist: Caryl Comes 02/06/2020 Office Weight:176lbs Right Ventricular:96% Left Ventricular:95%    Total Time in AT/AF0 hours  Transmission reviewed.  1/31/2022[employer] Heart Failure Index0suggesting normal fluid levels.  Prescribed:  Torsemide 20 mgTake2tablets(40 mg total) by mouth daily.  Potassium 20 mEq 1 tablet daily  Labs: 02/05/2020 Creatinine 2.09, BUN 28, Potassium 4.0, Sodium 143, GFR 28-32 01/22/2020 Creatinine 1.75, BUN 24, Potassium 4.5, Sodium 143, GFR 34-40  A complete set of results can be found in Results Review.  Recommendations:No changes  Follow-up plan: ICM clinic phone appointment on3/07/2020. 91 day device clinic remote transmission2/21/2022.   EP/Cardiology next office visit:Recall 05/10/2020 with Richardson Dopp, PA.  Recall 12/31/2019 with Dr Caryl Comes  Copy of ICM check sent to Dr. Caryl Comes.  3 Month Trend                     Rosalene Billings, RN 03/06/2020 8:41 AM

## 2020-03-10 ENCOUNTER — Ambulatory Visit (INDEPENDENT_AMBULATORY_CARE_PROVIDER_SITE_OTHER): Payer: Medicare HMO

## 2020-03-10 ENCOUNTER — Other Ambulatory Visit: Payer: Self-pay

## 2020-03-10 DIAGNOSIS — Z5181 Encounter for therapeutic drug level monitoring: Secondary | ICD-10-CM | POA: Diagnosis not present

## 2020-03-10 DIAGNOSIS — K55069 Acute infarction of intestine, part and extent unspecified: Secondary | ICD-10-CM | POA: Diagnosis not present

## 2020-03-10 DIAGNOSIS — Z0000 Encounter for general adult medical examination without abnormal findings: Secondary | ICD-10-CM

## 2020-03-10 LAB — POCT INR: INR: 2 (ref 2.0–3.0)

## 2020-03-10 NOTE — Patient Instructions (Signed)
Continue taking warfarin 1/2 tablet daily  except for 1 tablet on Monday, Wednesday and Friday. Recheck in 4 weeks.   Call us with any medication changes or concerns # [phone].

## 2020-03-16 ENCOUNTER — Other Ambulatory Visit: Payer: Self-pay | Admitting: Cardiology

## 2020-03-18 ENCOUNTER — Ambulatory Visit: Payer: Self-pay | Admitting: Urology

## 2020-03-19 ENCOUNTER — Ambulatory Visit: Payer: Self-pay | Admitting: Physician Assistant

## 2020-03-19 NOTE — Progress Notes (Deleted)
Cardiology Office Note:    Date:  03/19/2020   ID:  [name], DOB July 13, 1931, MRN [id number]  PCP:  [name], MD  [facility] Cardiologist:  Sherren Mocha, MD  [facility] Electrophysiologist:  Virl Axe, MD   Chief Complaint:   History of Present Illness:    [name] is a 87 y.o. male with a hx of  chronic systolic heart failure, ischemic cardiomyopathy with an EF of 35 to 40% by echocardiogram November 2021, remote CABG 1994, history of ICD in place, last generator change was September 2019, PAFon Coumadin therapy, mild dementia and hard of hearing seen for follow up.   He was seen in the office 01/29/2020.  He had had increasing lower extremity edema and increasing weight.  His diuretics were increased as an outpatient with marginal results.  Lab done 01/22/2020 showed a BNP over thousand and a bump in his creatinine to 1.75.  I suggested he increase his torsemide to 40 mg twice daily.  We obtained follow-up labs and an office visit.  Labs done 02/05/2020 showed a BUN of 28 and a creatinine of 2.09.  BNP was ordered but never done by the lab.  Again seen 02/06/2020. Reduced Torsemide to 62m qd as weight back to baseline 176lb and bump in creatinine. Breathing improved but LE edema persisted.  Advised to take extra torsemide if weight above 180lb.      Past Medical History:  Diagnosis Date  . AAA (abdominal aortic aneurysm) ([place]   . Abdominal pain 04/14/2011  . Acute bronchitis 09/02/2013  . Acute vascular insufficiency of intestine (HCC)    Mesenteric embolus...surgery 2003  . Allergic rhinitis 09/09/2013  . Anxiety   . Aortic atherosclerosis ([place] 11/10/2015  . Atrial fibrillation (HCC)    paroxysmal  . Back pain 08/08/2016  . Biventricular implantable cardiac defibrillator in situ    GDT CONTAK H170-MADIT-CRT-EXPLANTED 2011 implanted defibrillator- Guidant cognis, model n119-2001 Dr. KCaryl Comes(11/28/2009)  . Breast tenderness    Spironolactone was stopped  and eplerinone started. Patient feels better  . CAD (coronary artery disease)    CABG 1994  /   catheterization 1996, occluded vein graft to the diagonal, other grafts patent  /   catheterization 2006, no PCI  /  nuclear, October, 2011, extensive scar anteroseptal and apical, no ischemia  . Carotid artery disease ([place]    Doppler, February,  2012, 0-39% bilateral, recommended followup 1 year  . Chronic systolic heart failure (HCC)    chronic systolic   . Chronotropic incompetence   . Cirrhosis, nonalcoholic ([place] 38/32/5498 . Colonic polyp 2010   pathology not clear.   . Crohn's disease ([place]   . Degenerative joint disease   . Dermatitis 04/05/2016  . Dizziness    Positional dizziness  . Edema leg    Venous Dopplers 8/13: No DVT bilaterally  . Ejection fraction    EF 35%, echo, November, 2011  //   Is 35-40%, echo,  September 23, 2011   . Fatigue 06/25/2008   Qualifier: Diagnosis of  By: KRon Parker MD, FLeonidas RombergJDorinda Hill  . GERD 02/21/2007   Qualifier: Diagnosis of  By: JRonnald RampCNA/MA, JJanett Billow   . Hyperglycemia 08/08/2016  . Hyperlipemia   . Hypertension   . Hypertrophy of prostate with urinary obstruction and other lower urinary tract symptoms (LUTS)   . Hypoalbuminemia 06/16/2013  . Hypokalemia 06/18/2013  . Implantable cardioverter-defibrillator (ICD) in situ 12/09/2009   Annotation: EXPLANTED 2011   //  New Guidant ICD implanted 2011     . Iron deficiency anemia   . Ischemic cardiomyopathy    CABG 1994 CAD catherization 1996.[name] occluded vein graft to the diagnol, other grafts patent/  catherization 2006, no PCI/ nuclear.[name] october 2011.[name] extensive scar anteroseptal and apical.. no ischemia    . LBBB (left bundle branch block)   . MCI (mild cognitive impairment) 02/27/2017  . 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 Mitral regurgitation    mild.[name] echo november 2011  EF 35%...echo.. november 2009/ ef 35%.[name] echo november 2011  . Oral thrush 02/05/2015  . OSA (obstructive sleep apnea) 09/16/2011    NPSG 2013:  AHI 34/hr.    . Polymyalgia rheumatica ([place])   . Shingles   . Sinus bradycardia   . Skin cancer 08/08/2016  . Thrombocytopenia ([place]) 2008  . Thrush   . Tremor   . Ventral hernia    multiple, wears an abd binder, reports he hs been told he was at increased riisk for surgery  . Vitamin B12 deficiency   . Vitamin D deficiency 11/06/2008   Qualifier: Diagnosis of  By: Lenna Gilford MD, Deborra Medina   . Warfarin anticoagulation    Coumadin therapy for mesenteric embolus 2003    Past Surgical History:  Procedure Laterality Date  . BIV ICD GENERATOR CHANGEOUT N/A 10/23/2017   Procedure: BIV ICD GENERATOR CHANGEOUT;  Surgeon: Deboraha Sprang, MD;  Location: [facility];  Service: Cardiovascular;  Laterality: N/A;  . CATARACT EXTRACTION, BILATERAL  2014  . COLONOSCOPY N/A 04/19/2013   Procedure: COLONOSCOPY;  Surgeon: Jerene Bears, MD;  Location: [facility] ENDOSCOPY;  Service: Endoscopy;  Laterality: N/A;  . CORONARY ARTERY BYPASS GRAFT  1994  . Elap w/ superior mesenteric artery embolectomy  1/03   by Dr. Jennette Banker  . ESOPHAGOGASTRODUODENOSCOPY N/A 04/18/2013   Procedure: ESOPHAGOGASTRODUODENOSCOPY (EGD);  Surgeon: Jerene Bears, MD;  Location: [facility] ENDOSCOPY;  Service: Endoscopy;  Laterality: N/A;  . implantation of biventric cardioverter-defibrillatorr     by Dr. Lovena Le in 8/06 Guidant Contak H170-explanted 2011  . implantation of guidant cognis device     model n119-2011  . laparoscopic cholecystectomy  2002  . left inguinal hernia repair with mesh  6/08   by Dr. Betsy Pries  . PROSTATE SURGERY  08/2012   by urology in [place]  . Repair of incarcerated ventral hernia and lysis of adhesions  11/03   by Dr. Betsy Pries    Current Medications: No outpatient medications have been marked as taking for the 03/19/20 encounter (Appointment) with Leanor Kail, [place].     Allergies:   Patient has no known allergies.   Social History   Socioeconomic History  . Marital status: Married    Spouse  name: Kennyth Lose  . Number of children: 3  . Years of education: Not on file  . Highest education level: Not on file  Occupational History  . Occupation: bull Actuary  . Occupation: farmer    Fish farm manager: RETIRED  . Occupation: logger  Tobacco Use  . Smoking status: Former Smoker    Packs/day: 1.00    Years: 20.00    Pack years: 20.00    Types: Cigarettes    Quit date: 01/31/1973    Years since quitting: 47.1  . Smokeless tobacco: Never Used  Vaping Use  . Vaping Use: Never used  Substance and Sexual Activity  . Alcohol use: Yes    Alcohol/week: 0.0 standard drinks    Comment: occasional  . Drug use: No  .  Sexual activity: Never    Comment: lives with wife, no dietary restrictions  Other Topics Concern  . Not on file  Social History Narrative   Lives in [place], [place] with wife.    Social Determinants of Health   Financial Resource Strain: Not on file  Food Insecurity: Not on file  Transportation Needs: Not on file  Physical Activity: Not on file  Stress: Not on file  Social Connections: Not on file     Family History: The patient's family history includes Appendicitis in his brother; Cirrhosis in his brother; Dementia in his sister; Diabetes in his son; Edema in his sister; Heart disease in his mother, sister, sister, and sister; Heart disease (age of onset: 68) in his father; Heart failure in his sister; Hyperlipidemia in an other family member; Hypertension in his brother, father, sister, and another family member; Kidney disease in his brother; Leukemia in his brother; Myasthenia gravis in his brother; Tremor in his sister.  ***  ROS:   Please see the history of present illness.    All other systems reviewed and are negative. ***  EKGs/Labs/Other Studies Reviewed:    The following studies were reviewed today: ***  EKG:  EKG is *** ordered today.  The ekg ordered today demonstrates ***  Recent Labs: 08/26/2019: ALT 15 09/27/2019: TSH 1.91 01/22/2020: NT-Pro BNP  1,003 02/05/2020: BNP 136.2; BUN 28; Creatinine, Ser 2.09; Hemoglobin 9.0; Platelets 54; Potassium 4.0; Sodium 143  Recent Lipid Panel    Component Value Date/Time   CHOL 119 09/27/2019 1400   TRIG 63 09/27/2019 1400   HDL 53 09/27/2019 1400   CHOLHDL 2.2 09/27/2019 1400   VLDL 23.2 01/01/2019 1011   LDLCALC 52 09/27/2019 1400   LDLDIRECT 81.6 12/30/2013 1213     Risk Assessment/Calculations:   {Does this patient have ATRIAL FIBRILLATION?:[id number]}   Physical Exam:    VS:  There were no vitals taken for this visit.    Wt Readings from Last 3 Encounters:  02/06/20 176 lb 9.6 oz (80.1 kg)  01/29/20 182 lb 3.2 oz (82.6 kg)  11/12/19 184 lb (83.5 kg)     GEN: *** Well nourished, well developed in no acute distress HEENT: Normal NECK: No JVD; No carotid bruits LYMPHATICS: No lymphadenopathy CARDIAC: ***RRR, no murmurs, rubs, gallops RESPIRATORY:  Clear to auscultation without rales, wheezing or rhonchi  ABDOMEN: Soft, non-tender, non-distended MUSCULOSKELETAL:  No edema; No deformity  SKIN: Warm and dry NEUROLOGIC:  Alert and oriented x 3 PSYCHIATRIC:  Normal affect   ASSESSMENT AND PLAN:    1. ***  2. ***  Medication Adjustments/Labs and Tests Ordered: Current medicines are reviewed at length with the patient today.  Concerns regarding medicines are outlined above.  No orders of the defined types were placed in this encounter.  No orders of the defined types were placed in this encounter.   There are no Patient Instructions on file for this visit.   Jarrett Soho, [place]  03/19/2020 2:21 PM     [facility]

## 2020-03-22 ENCOUNTER — Other Ambulatory Visit: Payer: Self-pay | Admitting: Family Medicine

## 2020-04-06 ENCOUNTER — Ambulatory Visit (INDEPENDENT_AMBULATORY_CARE_PROVIDER_SITE_OTHER): Payer: Medicare HMO

## 2020-04-06 DIAGNOSIS — I5022 Chronic systolic (congestive) heart failure: Secondary | ICD-10-CM | POA: Diagnosis not present

## 2020-04-06 DIAGNOSIS — Z95810 Presence of automatic (implantable) cardiac defibrillator: Secondary | ICD-10-CM

## 2020-04-07 ENCOUNTER — Other Ambulatory Visit: Payer: Self-pay

## 2020-04-07 ENCOUNTER — Ambulatory Visit (INDEPENDENT_AMBULATORY_CARE_PROVIDER_SITE_OTHER): Payer: Medicare HMO

## 2020-04-07 DIAGNOSIS — Z0000 Encounter for general adult medical examination without abnormal findings: Secondary | ICD-10-CM

## 2020-04-07 DIAGNOSIS — Z5181 Encounter for therapeutic drug level monitoring: Secondary | ICD-10-CM

## 2020-04-07 DIAGNOSIS — K55069 Acute infarction of intestine, part and extent unspecified: Secondary | ICD-10-CM

## 2020-04-07 LAB — POCT INR: INR: 2.2 (ref 2.0–3.0)

## 2020-04-07 NOTE — Patient Instructions (Signed)
Continue taking warfarin 1/2 tablet daily  except for 1 tablet on Monday, Wednesday and Friday. Recheck in 4 weeks.   Call us with any medication changes or concerns # [phone].

## 2020-04-07 NOTE — Progress Notes (Signed)
EPIC Encounter for ICM Monitoring  Patient Name: [name] is a 87 y.o. male Date: 04/07/2020 Primary Care Physican: [name], MD Primary Cardiologist:Cooper/Weaver, PA Electrophysiologist: Caryl Comes 02/06/2020 Office Weight:176lbs Right Ventricular:96% Left Ventricular:95%    Total Time in AT/AF0 hours  Transmission reviewed.  3/7/2022[employer] Heart Failure Index0suggesting normal fluid levels.  Prescribed:  Torsemide 20 mgTake2tablets(40 mg total) by mouth daily.  Potassium 20 mEq 1 tablet daily  Labs: 02/05/2020 Creatinine 2.09, BUN 28, Potassium 4.0, Sodium 143, GFR 28-32 01/22/2020 Creatinine 1.75, BUN 24, Potassium 4.5, Sodium 143, GFR 34-40  A complete set of results can be found in Results Review.  Recommendations:No changes  Follow-up plan: ICM clinic phone appointment on4/12/2020. 91 day device clinic remote transmission4/21/2022.   EP/Cardiology next office visit:Recall 05/10/2020 with Richardson Dopp, PA.  Recall 12/31/2019 with Dr Caryl Comes  Copy of ICM check sent to Dr. Caryl Comes.  3 month ICM trend: 04/06/2020.    Rosalene Billings, RN 04/07/2020 4:29 PM

## 2020-04-08 ENCOUNTER — Telehealth (INDEPENDENT_AMBULATORY_CARE_PROVIDER_SITE_OTHER): Payer: Medicare HMO | Admitting: Family Medicine

## 2020-04-08 ENCOUNTER — Encounter: Payer: Self-pay | Admitting: Family Medicine

## 2020-04-08 DIAGNOSIS — K219 Gastro-esophageal reflux disease without esophagitis: Secondary | ICD-10-CM | POA: Diagnosis not present

## 2020-04-08 DIAGNOSIS — J302 Other seasonal allergic rhinitis: Secondary | ICD-10-CM

## 2020-04-08 MED ORDER — LEVOCETIRIZINE DIHYDROCHLORIDE 5 MG PO TABS: 5.0000 mg | ORAL_TABLET | Freq: Every evening | ORAL | 2 refills | Status: DC

## 2020-04-08 MED ORDER — PANTOPRAZOLE SODIUM 40 MG PO TBEC: DELAYED_RELEASE_TABLET | ORAL | 3 refills | Status: AC

## 2020-04-08 NOTE — Progress Notes (Addendum)
Chief Complaint  Patient presents with  . Follow-up    Subjective: Patient is a 87 y.o. male here for f/u. Due to COVID-19 pandemic, we are interacting via telephone. I verified patient's ID using 2 identifiers. Patient's spouse agreed to proceed with visit via this method. Patient is at home, I am at office. Patient, spouse and I are present for visit.   The patient has a history of reflux.  He takes pantoprazole 40 mg daily.  His wife tries to make sure he takes it without other medications so absorption is not effective.  He denies any adverse effects.  Symptoms are severe if he stops taking this.  The patient seems to have a chronically runny nose.  He has a history of poor taste and smell.  He takes Flonase daily.  He does not take any oral antihistamine daily.  No fevers, sore throat, or other upper respiratory symptoms.  Past Medical History:  Diagnosis Date  . AAA (abdominal aortic aneurysm) ([place])   . Abdominal pain 04/14/2011  . Acute bronchitis 09/02/2013  . Acute vascular insufficiency of intestine (HCC)    Mesenteric embolus...surgery 2003  . Allergic rhinitis 09/09/2013  . Anxiety   . Aortic atherosclerosis ([place]) 11/10/2015  . Atrial fibrillation (HCC)    paroxysmal  . Back pain 08/08/2016  . Biventricular implantable cardiac defibrillator in situ    GDT CONTAK H170-MADIT-CRT-EXPLANTED 2011 implanted defibrillator- Guidant cognis, model n119-2001 Dr. Caryl Comes (11/28/2009)  . Breast tenderness    Spironolactone was stopped and eplerinone started. Patient feels better  . CAD (coronary artery disease)    CABG 1994  /   catheterization 1996, occluded vein graft to the diagonal, other grafts patent  /   catheterization 2006, no PCI  /  nuclear, October, 2011, extensive scar anteroseptal and apical, no ischemia  . Carotid artery disease ([place])    Doppler, February,  2012, 0-39% bilateral, recommended followup 1 year  . Chronic systolic heart failure (HCC)    chronic systolic   .  Chronotropic incompetence   . Cirrhosis, nonalcoholic ([place]) 7/42/5956  . Colonic polyp 2010   pathology not clear.   . Crohn's disease ([place])   . Degenerative joint disease   . Dermatitis 04/05/2016  . Dizziness    Positional dizziness  . Edema leg    Venous Dopplers 8/13: No DVT bilaterally  . Ejection fraction    EF 35%, echo, November, 2011  //   Is 35-40%, echo,  September 23, 2011   . Fatigue 06/25/2008   Qualifier: Diagnosis of  By: [name], MD, Leonidas Romberg Dorinda Hill   . GERD 02/21/2007   Qualifier: Diagnosis of  By: Ronnald Ramp CNA/MA, Janett Billow    . Hyperglycemia 08/08/2016  . Hyperlipemia   . Hypertension   . Hypertrophy of prostate with urinary obstruction and other lower urinary tract symptoms (LUTS)   . Hypoalbuminemia 06/16/2013  . Hypokalemia 06/18/2013  . Implantable cardioverter-defibrillator (ICD) in situ 12/09/2009   Annotation: EXPLANTED 2011   //   New Guidant ICD implanted 2011     . Iron deficiency anemia   . Ischemic cardiomyopathy    CABG 1994 CAD catherization 1996.[name] occluded vein graft to the diagnol, other grafts patent/  catherization 2006, no PCI/ nuclear.[name] october 2011.[name] extensive scar anteroseptal and apical.. no ischemia    . LBBB (left bundle branch block)   . MCI (mild cognitive impairment) 02/27/2017  . 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 Mitral regurgitation    mild.[name] echo november 2011  EF 35%...echo.. november 2009/ ef 35%.[name] echo november 2011  . Oral thrush 02/05/2015  . OSA (obstructive sleep apnea) 09/16/2011   NPSG 2013:  AHI 34/hr.    . Polymyalgia rheumatica ([place])   . Shingles   . Sinus bradycardia   . Skin cancer 08/08/2016  . Thrombocytopenia ([place]) 2008  . Thrush   . Tremor   . Ventral hernia    multiple, wears an abd binder, reports he hs been told he was at increased riisk for surgery  . Vitamin B12 deficiency   . Vitamin D deficiency 11/06/2008   Qualifier: Diagnosis of  By: Lenna Gilford MD, Deborra Medina   . Warfarin anticoagulation    Coumadin therapy for  mesenteric embolus 2003    Objective: No conversational dyspnea  Assessment and Plan: Gastroesophageal reflux disease, unspecified whether esophagitis present - Plan: pantoprazole (PROTONIX) 40 MG tablet  Seasonal allergies - Plan: levocetirizine (XYZAL) 5 MG tablet  1. Cont Protonix 40 mg/d/ 2. Trial Xyzal 5 mg/d. Cont INCS daily.  F/u in 6 mo for CPE w Dr. Charlett Blake.  Total time: 16 min The patient's spouse voiced understanding and agreement to the plan.  [place], DO 04/08/20  2:05 PM

## 2020-04-29 ENCOUNTER — Other Ambulatory Visit: Payer: Self-pay | Admitting: Cardiology

## 2020-05-05 ENCOUNTER — Ambulatory Visit (INDEPENDENT_AMBULATORY_CARE_PROVIDER_SITE_OTHER): Payer: Medicare HMO

## 2020-05-05 ENCOUNTER — Other Ambulatory Visit: Payer: Self-pay

## 2020-05-05 DIAGNOSIS — Z0000 Encounter for general adult medical examination without abnormal findings: Secondary | ICD-10-CM | POA: Diagnosis not present

## 2020-05-05 DIAGNOSIS — Z5181 Encounter for therapeutic drug level monitoring: Secondary | ICD-10-CM | POA: Diagnosis not present

## 2020-05-05 DIAGNOSIS — K55069 Acute infarction of intestine, part and extent unspecified: Secondary | ICD-10-CM | POA: Diagnosis not present

## 2020-05-05 LAB — POCT INR: INR: 2 (ref 2.0–3.0)

## 2020-05-05 NOTE — Progress Notes (Signed)
Cardiology Office Note    Date:  05/06/2020   ID:  [name], [name] 1931/02/10, MRN [id number]   PCP:  [name] Lukes, MD   [name]  Cardiologist:  [name] Mocha, MD  Advanced Practice Provider:  No care team member to display Electrophysiologist:  [name] Axe, MD   [phone]   Chief Complaint  Patient presents with  . Follow-up    History of Present Illness:  [name] is a 87 y.o. male with history of CAD status post CABG 1994, ischemic cardiomyopathy EF 35 to 40% on echo 04/5595, chronic systolic CHF, ICD last generator change 2019, PAF on Coumadin, mild dementia.  Patient was seen by [name] Ransom, PA-C 02/06/2020 for follow-up of CHF after diuretics were increased in December.  Weight was back down to 176 pounds.  He still had some lower extremity edema but breathing was better.  Creatinine jumped to 2.0 with increased diuretics so torsemide was decreased to 40 mg daily.  He could take extra if needed.  Patient comes in accompanied by his wife. He thinks he has a cold. Denies chest pain, palpitations, dyspnea. He does have leg edema. Says they get no extra salt in diet. Weight 171 today. No ICD discharges. He has mild dementia. His wife says he slipped out of the house, drove to the store and bought some chewing tobacco-never used it before.    Past Medical History:  Diagnosis Date  . AAA (abdominal aortic aneurysm) ([place])   . Abdominal pain 04/14/2011  . Acute bronchitis 09/02/2013  . Acute vascular insufficiency of intestine (HCC)    Mesenteric embolus...surgery 2003  . Allergic rhinitis 09/09/2013  . Anxiety   . Aortic atherosclerosis ([place]) 11/10/2015  . Atrial fibrillation (HCC)    paroxysmal  . Back pain 08/08/2016  . Biventricular implantable cardiac defibrillator in situ    GDT CONTAK H170-MADIT-CRT-EXPLANTED 2011 implanted defibrillator- Guidant cognis, model n119-2001 Dr. Caryl Comes (11/28/2009)  . Breast tenderness    Spironolactone  was stopped and eplerinone started. Patient feels better  . CAD (coronary artery disease)    CABG 1994  /   catheterization 1996, occluded vein graft to the diagonal, other grafts patent  /   catheterization 2006, no PCI  /  nuclear, October, 2011, extensive scar anteroseptal and apical, no ischemia  . Carotid artery disease ([place])    Doppler, February,  2012, 0-39% bilateral, recommended followup 1 year  . Chronic systolic heart failure (HCC)    chronic systolic   . Chronotropic incompetence   . Cirrhosis, nonalcoholic ([place]) 05/17/3843  . Colonic polyp 2010   pathology not clear.   . Crohn's disease ([place])   . Degenerative joint disease   . Dermatitis 04/05/2016  . Dizziness    Positional dizziness  . Edema leg    Venous Dopplers 8/13: No DVT bilaterally  . Ejection fraction    EF 35%, echo, November, 2011  //   Is 35-40%, echo,  September 23, 2011   . Fatigue 06/25/2008   Qualifier: Diagnosis of  By: [name], MD, Leonidas Romberg Dorinda Hill   . GERD 02/21/2007   Qualifier: Diagnosis of  By: Ronnald Ramp CNA/MA, Janett Billow    . Hyperglycemia 08/08/2016  . Hyperlipemia   . Hypertension   . Hypertrophy of prostate with urinary obstruction and other lower urinary tract symptoms (LUTS)   . Hypoalbuminemia 06/16/2013  . Hypokalemia 06/18/2013  . Implantable cardioverter-defibrillator (ICD) in situ 12/09/2009   Annotation: EXPLANTED 2011   //  New Guidant ICD implanted 2011     . Iron deficiency anemia   . Ischemic cardiomyopathy    CABG 1994 CAD catherization 1996.[name] occluded vein graft to the diagnol, other grafts patent/  catherization 2006, no PCI/ nuclear.[name] october 2011.[name] extensive scar anteroseptal and apical.. no ischemia    . LBBB (left bundle branch block)   . MCI (mild cognitive impairment) 02/27/2017  . 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 Mitral regurgitation    mild.[name] echo november 2011  EF 35%...echo.. november 2009/ ef 35%.[name] echo november 2011  . Oral thrush 02/05/2015  . OSA (obstructive sleep  apnea) 09/16/2011   NPSG 2013:  AHI 34/hr.    . Polymyalgia rheumatica ([place])   . Shingles   . Sinus bradycardia   . Skin cancer 08/08/2016  . Thrombocytopenia ([place]) 2008  . Thrush   . Tremor   . Ventral hernia    multiple, wears an abd binder, reports he hs been told he was at increased riisk for surgery  . Vitamin B12 deficiency   . Vitamin D deficiency 11/06/2008   Qualifier: Diagnosis of  By: Lenna Gilford MD, Deborra Medina   . Warfarin anticoagulation    Coumadin therapy for mesenteric embolus 2003    Past Surgical History:  Procedure Laterality Date  . BIV ICD GENERATOR CHANGEOUT N/A 10/23/2017   Procedure: BIV ICD GENERATOR CHANGEOUT;  Surgeon: Deboraha Sprang, MD;  Location: [facility];  Service: Cardiovascular;  Laterality: N/A;  . CATARACT EXTRACTION, BILATERAL  2014  . COLONOSCOPY N/A 04/19/2013   Procedure: COLONOSCOPY;  Surgeon: Jerene Bears, MD;  Location[facility] ENDOSCOPY;  Service: Endoscopy;  Laterality: N/A;  . CORONARY ARTERY BYPASS GRAFT  1994  . Elap w/ superior mesenteric artery embolectomy  1/03   by Dr. Jennette Banker  . ESOPHAGOGASTRODUODENOSCOPY N/A 04/18/2013   Procedure: ESOPHAGOGASTRODUODENOSCOPY (EGD);  Surgeon: Jerene Bears, MD;  Location: [facility] ENDOSCOPY;  Service: Endoscopy;  Laterality: N/A;  . implantation of biventric cardioverter-defibrillatorr     by Dr. Lovena Le in 8/06 Guidant Contak H170-explanted 2011  . implantation of guidant cognis device     model n119-2011  . laparoscopic cholecystectomy  2002  . left inguinal hernia repair with mesh  6/08   by Dr. Betsy Pries  . PROSTATE SURGERY  08/2012   by urology in [place]  . Repair of incarcerated ventral hernia and lysis of adhesions  11/03   by Dr. Betsy Pries    Current Medications: Current Meds  Medication Sig  . acetaminophen (TYLENOL) 650 MG CR tablet Take 650 mg by mouth daily as needed for pain.  [name] albuterol (VENTOLIN HFA) 108 (90 Base) MCG/ACT inhaler Inhale 2 puffs into the lungs every 4 (four) hours as needed  for wheezing or shortness of breath.  . Ascorbic Acid (VITAMIN C) 100 MG tablet Take 100 mg by mouth daily.  . carvedilol (COREG) 6.25 MG tablet Take 1 tablet (6.25 mg total) by mouth 2 (two) times daily with a meal.  . cholecalciferol (VITAMIN D) 1000 UNITS tablet Take 1,000 Units by mouth every evening.  . cholestyramine (QUESTRAN) 4 g packet Take 1 packet (4 g total) by mouth daily.  [name] donepezil (ARICEPT) 10 MG tablet Take 1 tablet (10 mg total) by mouth at bedtime.  . fluticasone (FLOVENT HFA) 220 MCG/ACT inhaler Inhale 1 puff into the lungs in the morning and at bedtime.  . hydrocortisone cream 1 % Apply 1 application topically daily as needed for itching.  . levocetirizine (XYZAL) 5 MG  tablet Take 1 tablet (5 mg total) by mouth every evening.  [name] lisinopril (ZESTRIL) 10 MG tablet Take 1 tablet (10 mg total) by mouth daily.  . Loperamide HCl (LOPERAMIDE A-D PO) Take 1 tablet by mouth 2 (two) times daily as needed (Diarrhea).  . magnesium gluconate (MAGONATE) 500 MG tablet Take 500 mg by mouth as needed (cramps).  . memantine (NAMENDA) 10 MG tablet Take 1 tablet by mouth twice daily  . Menthol, Topical Analgesic, (BIOFREEZE EX) Apply 1 application topically daily as needed (pain).  . pantoprazole (PROTONIX) 40 MG tablet TAKE 1 TABLET BY MOUTH ONCE DAILY AT NOON  . potassium chloride SA (KLOR-CON) 20 MEQ tablet Take 1 tablet (20 mEq total) by mouth daily.  . Simethicone (GAS-X MAXIMUM STRENGTH PO) Take 1-2 tablets by mouth daily as needed (Flatulence).  . simvastatin (ZOCOR) 20 MG tablet TAKE 1 TABLET BY MOUTH AT BEDTIME  . torsemide (DEMADEX) 20 MG tablet Take 2 tablets (40 mg total) by mouth daily.  . vitamin B-12 (CYANOCOBALAMIN) 1000 MCG tablet Take 500 mcg by mouth daily.   [name] warfarin (COUMADIN) 5 MG tablet TAKE 1/2-1 TABLET DAILY  AS DIRECTED BY THE COUMADIN CLINIC     Allergies:   Patient has no known allergies.   Social History   Socioeconomic History  . Marital status: Married     Spouse name: Kennyth Lose  . Number of children: 3  . Years of education: Not on file  . Highest education level: Not on file  Occupational History  . Occupation: bull Actuary  . Occupation: farmer    Fish farm manager: RETIRED  . Occupation: logger  Tobacco Use  . Smoking status: Former Smoker    Packs/day: 1.00    Years: 20.00    Pack years: 20.00    Types: Cigarettes    Quit date: 01/31/1973    Years since quitting: 47.2  . Smokeless tobacco: Never Used  Vaping Use  . Vaping Use: Never used  Substance and Sexual Activity  . Alcohol use: Yes    Alcohol/week: 0.0 standard drinks    Comment: occasional  . Drug use: No  . Sexual activity: Never    Comment: lives with wife, no dietary restrictions  Other Topics Concern  . Not on file  Social History Narrative   Lives in [place], [place] with wife.    Social Determinants of Health   Financial Resource Strain: Not on file  Food Insecurity: Not on file  Transportation Needs: Not on file  Physical Activity: Not on file  Stress: Not on file  Social Connections: Not on file     Family History:  The patient's family history includes Appendicitis in his brother; Cirrhosis in his brother; Dementia in his sister; Diabetes in his son; Edema in his sister; Heart disease in his mother, sister, sister, and sister; Heart disease (age of onset: 25) in his father; Heart failure in his sister; Hyperlipidemia in an other family member; Hypertension in his brother, father, sister, and another family member; Kidney disease in his brother; Leukemia in his brother; Myasthenia gravis in his brother; Tremor in his sister.   ROS:   Please see the history of present illness.    ROS All other systems reviewed and are negative.   PHYSICAL EXAM:   VS:  BP (!) 106/48   Pulse 60   Ht 5' 10"  (1.778 m)   Wt 171 lb (77.6 kg)   SpO2 96%   BMI 24.54 kg/m   Physical Exam  GEN: Well nourished, well developed, in no acute distress  Neck: no JVD, carotid bruits,  or masses Cardiac:RRR; no murmurs, rubs, or gallops  Respiratory:  clear to auscultation bilaterally, normal work of breathing GI: soft, nontender, nondistended, + BS Ext: plus 2 edema, without cyanosis, clubbing,  Good distal pulses bilaterally Neuro:  Alert and Oriented x 3 Psych: euthymic mood, full affect  Wt Readings from Last 3 Encounters:  05/06/20 171 lb (77.6 kg)  02/06/20 176 lb 9.6 oz (80.1 kg)  01/29/20 182 lb 3.2 oz (82.6 kg)      Studies/Labs Reviewed:   EKG:  EKG is not ordered today.   Recent Labs: 08/26/2019: ALT 15 09/27/2019: TSH 1.91 01/22/2020: NT-Pro BNP 1,003 02/05/2020: BNP 136.2; BUN 28; Creatinine, Ser 2.09; Hemoglobin 9.0; Platelets 54; Potassium 4.0; Sodium 143   Lipid Panel    Component Value Date/Time   CHOL 119 09/27/2019 1400   TRIG 63 09/27/2019 1400   HDL 53 09/27/2019 1400   CHOLHDL 2.2 09/27/2019 1400   VLDL 23.2 01/01/2019 1011   LDLCALC 52 09/27/2019 1400   LDLDIRECT 81.6 12/30/2013 1213    Additional studies/ records that were reviewed today include:  2D echo 12/04/2019 IMPRESSIONS     1. Left ventricular ejection fraction, by estimation, is 35 to 40%. The  left ventricle has moderately decreased function. The left ventricle  demonstrates global hypokinesis. There is mild concentric left ventricular  hypertrophy. Left ventricular  diastolic parameters are consistent with Grade I diastolic dysfunction  (impaired relaxation).   2. Right ventricular systolic function is normal. The right ventricular  size is normal.   3. Left atrial size was severely dilated.   4. The mitral valve is normal in structure. Mild mitral valve  regurgitation. No evidence of mitral stenosis.   5. The aortic valve is tricuspid. Aortic valve regurgitation is mild. No  aortic stenosis is present.   6. Aortic dilatation noted. There is mild dilatation of the ascending  aorta, measuring 37 mm.   FINDINGS   Left Ventricle: Left ventricular ejection  fraction, by estimation, is 35  to 40%. The left ventricle has moderately decreased function. The left  ventricle demonstrates global hypokinesis. The left ventricular internal  cavity size was normal in size.  There is mild concentric left ventricular hypertrophy. Left ventricular  diastolic parameters are consistent with Grade I diastolic dysfunction  (impaired relaxation). Indeterminate filling pressures.   Right Ventricle: The right ventricular size is normal. No increase in  right ventricular wall thickness. Right ventricular systolic function is  normal.   Left Atrium: Left atrial size was severely dilated.   Right Atrium: Right atrial size was normal in size.   Pericardium: There is no evidence of pericardial effusion.   Mitral Valve: The mitral valve is normal in structure. Mild mitral valve  regurgitation. No evidence of mitral valve stenosis.   Tricuspid Valve: The tricuspid valve is normal in structure. Tricuspid  valve regurgitation is mild . No evidence of tricuspid stenosis.   Aortic Valve: The aortic valve is tricuspid. Aortic valve regurgitation is  mild. Aortic regurgitation PHT measures 583 msec. No aortic stenosis is  present.   Pulmonic Valve: The pulmonic valve was normal in structure. Pulmonic valve  regurgitation is not visualized. No evidence of pulmonic stenosis.   Aorta: Aortic dilatation noted. There is mild dilatation of the ascending  aorta, measuring 37 mm.   Venous: The inferior vena cava was not well visualized.   IAS/Shunts: No atrial level shunt  detected by color flow Doppler.   Additional Comments: A pacer wire is visualized.     Risk Assessment/Calculations:    CHA2DS2-VASc Score = 5  This indicates a 7.2% annual risk of stroke. The patient's score is based upon: CHF History: Yes HTN History: Yes Diabetes History: No Stroke History: No Vascular Disease History: Yes Age Score: 2 Gender Score: 0        ASSESSMENT:    1.  Acute on chronic combined systolic and diastolic CHF (congestive heart failure) ([place])   2. Ischemic cardiomyopathy   3. Coronary artery disease involving coronary bypass graft of native heart without angina pectoris   4. Implantable cardioverter-defibrillator (ICD) in situ   5. Paroxysmal atrial fibrillation (HCC)      PLAN:  In order of problems listed above:  Acute on chronic combined systolic and diastolic CHF with lower extremity edema.  Wife states they are given a extra salt in her diet.  Will increase Demadex to 60 mg daily for 3 days then back down to 40 mg daily increase potassium to 20 mEq 2 a day for 3 days and back to 1 a day.  Check labs today.  2 g sodium diet.  Weigh daily and can take extra Demadex if needed.  CAD status post CABG 1994 no angina  Ischemic cardiomyopathy ejection fraction 35 to 40% on echo 12/2019  ICD last generator change 2019 remote check 02/20/2020 stable  PAF on Coumadin heart rate regular today.  No bleeding problems on Coumadin.    Shared Decision Making/Informed Consent        Medication Adjustments/Labs and Tests Ordered: Current medicines are reviewed at length with the patient today.  Concerns regarding medicines are outlined above.  Medication changes, Labs and Tests ordered today are listed in the Patient Instructions below. Patient Instructions   Medication Instructions:  Your physician recommends that you continue on your current medications as directed. Please refer to the Current Medication list given to you today.  INCREASE Demadex to 3 tablets a day for 3 days and then resume 2 tablets daily Increase Potassium to 2 tablets daily and then resume 1 tablet daily  *If you need a refill on your cardiac medications before your next appointment, please call your pharmacy*   Lab Work: TODAY:BMET, BNP  If you have labs (blood work) drawn today and your tests are completely normal, you will receive your results only by: [name] MyChart  Message (if you have MyChart) OR . A paper copy in the mail If you have any lab test that is abnormal or we need to change your treatment, we will call you to review the results.   Testing/Procedures: none   Follow-Up: At [facility], you and your health needs are our priority.  As part of our continuing mission to provide you with exceptional heart care, we have created designated Provider Care Teams.  These Care Teams include your primary Cardiologist (physician) and Advanced Practice Providers (APPs -  Physician Assistants and Nurse Practitioners) who all work together to provide you with the care you need, when you need it.   Your next appointment:   4 month(s)  The format for your next appointment:   In Person  Provider:   Sherren Mocha, MD   Other: Two Gram Sodium Diet 2000 mg  What is Sodium? Sodium is a mineral found naturally in many foods. The most significant source of sodium in the diet is table salt, which is about 40% sodium.  Processed,  convenience, and preserved foods also contain a large amount of sodium.  The body needs only 500 mg of sodium daily to function,  A normal diet provides more than enough sodium even if you do not use salt.  Why Limit Sodium? A build up of sodium in the body can cause thirst, increased blood pressure, shortness of breath, and water retention.  Decreasing sodium in the diet can reduce edema and risk of heart attack or stroke associated with high blood pressure.  Keep in mind that there are many other factors involved in these health problems.  Heredity, obesity, lack of exercise, cigarette smoking, stress and what you eat all play a role.  General Guidelines:  Do not add salt at the table or in cooking.  One teaspoon of salt contains over 2 grams of sodium.  Read food labels  Avoid processed and convenience foods  Ask your dietitian before eating any foods not dicussed in the menu planning guidelines  Consult your physician if  you wish to use a salt substitute or a sodium containing medication such as antacids.  Limit milk and milk products to 16 oz (2 cups) per day.  Shopping Hints:  READ LABELS!! "Dietetic" does not necessarily mean low sodium.  Salt and other sodium ingredients are often added to foods during processing.   Menu Planning Guidelines Food Group Choose More Often Avoid  Beverages (see also the milk group All fruit juices, low-sodium, salt-free vegetables juices, low-sodium carbonated beverages Regular vegetable or tomato juices, commercially softened water used for drinking or cooking  Breads and Cereals Enriched white, wheat, rye and pumpernickel bread, hard rolls and dinner rolls; muffins, cornbread and waffles; most dry cereals, cooked cereal without added salt; unsalted crackers and breadsticks; low sodium or homemade bread crumbs Bread, rolls and crackers with salted tops; quick breads; instant hot cereals; pancakes; commercial bread stuffing; self-rising flower and biscuit mixes; regular bread crumbs or cracker crumbs  Desserts and Sweets Desserts and sweets mad with mild should be within allowance Instant pudding mixes and cake mixes  Fats Butter or margarine; vegetable oils; unsalted salad dressings, regular salad dressings limited to 1 Tbs; light, sour and heavy cream Regular salad dressings containing bacon fat, bacon bits, and salt pork; snack dips made with instant soup mixes or processed cheese; salted nuts  Fruits Most fresh, frozen and canned fruits Fruits processed with salt or sodium-containing ingredient (some dried fruits are processed with sodium sulfites        Vegetables Fresh, frozen vegetables and low- sodium canned vegetables Regular canned vegetables, sauerkraut, pickled vegetables, and others prepared in brine; frozen vegetables in sauces; vegetables seasoned with ham, bacon or salt pork  Condiments, Sauces, Miscellaneous  Salt substitute with physician's approval; pepper,  herbs, spices; vinegar, lemon or lime juice; hot pepper sauce; garlic powder, onion powder, low sodium soy sauce (1 Tbs.); low sodium condiments (ketchup, chili sauce, mustard) in limited amounts (1 tsp.) fresh ground horseradish; unsalted tortilla chips, pretzels, potato chips, popcorn, salsa (1/4 cup) Any seasoning made with salt including garlic salt, celery salt, onion salt, and seasoned salt; sea salt, rock salt, kosher salt; meat tenderizers; monosodium glutamate; mustard, regular soy sauce, barbecue, sauce, chili sauce, teriyaki sauce, steak sauce, Worcestershire sauce, and most flavored vinegars; canned gravy and mixes; regular condiments; salted snack foods, olives, picles, relish, horseradish sauce, catsup   Food preparation: Try these seasonings Meats:    Pork Sage, onion Serve with applesauce  Chicken Poultry seasoning, thyme, parsley Serve with cranberry  sauce  Lamb Curry powder, rosemary, garlic, thyme Serve with mint sauce or jelly  Veal Marjoram, basil Serve with current jelly, cranberry sauce  Beef Pepper, bay leaf Serve with dry mustard, unsalted chive butter  Fish [place] leaf, dill Serve with unsalted lemon butter, unsalted parsley butter  Vegetables:    Asparagus Lemon juice   Broccoli Lemon juice   Carrots Mustard dressing parsley, mint, nutmeg, glazed with unsalted butter and sugar   Green beans Marjoram, lemon juice, nutmeg,dill seed   Tomatoes Basil, marjoram, onion   Spice /blend for [employer]" 4 tsp ground thyme 1 tsp ground sage 3 tsp ground rosemary 4 tsp ground marjoram   Test your knowledge 1. A product that says "Salt Free" may still contain sodium. True or False 2. Garlic Powder and Hot Pepper Sauce an be used as alternative seasonings.True or False 3. Processed foods have more sodium than fresh foods.  True or False 4. Canned Vegetables have less sodium than froze True or False  WAYS TO DECREASE YOUR SODIUM INTAKE 1. Avoid the use of added salt in cooking  and at the table.  Table salt (and other prepared seasonings which contain salt) is probably one of the greatest sources of sodium in the diet.  Unsalted foods can gain flavor from the sweet, sour, and butter taste sensations of herbs and spices.  Instead of using salt for seasoning, try the following seasonings with the foods listed.  Remember: how you use them to enhance natural food flavors is limited only by your creativity... Allspice-Meat, fish, eggs, fruit, peas, red and yellow vegetables Almond Extract-Fruit baked goods Anise Seed-Sweet breads, fruit, carrots, beets, cottage cheese, cookies (tastes like licorice) Basil-Meat, fish, eggs, vegetables, rice, vegetables salads, soups, sauces Bay Leaf-Meat, fish, stews, poultry Burnet-Salad, vegetables (cucumber-like flavor) Caraway Seed-Bread, cookies, cottage cheese, meat, vegetables, cheese, rice Cardamon-Baked goods, fruit, soups Celery Powder or seed-Salads, salad dressings, sauces, meatloaf, soup, bread.Do not use  celery salt Chervil-Meats, salads, fish, eggs, vegetables, cottage cheese (parsley-like flavor) Chili Power-Meatloaf, chicken cheese, corn, eggplant, egg dishes Chives-Salads cottage cheese, egg dishes, soups, vegetables, sauces Cilantro-Salsa, casseroles Cinnamon-Baked goods, fruit, pork, lamb, chicken, carrots Cloves-Fruit, baked goods, fish, pot roast, green beans, beets, carrots Coriander-Pastry, cookies, meat, salads, cheese (lemon-orange flavor) Cumin-Meatloaf, fish,cheese, eggs, cabbage,fruit pie (caraway flavor) [employer], fruit, eggs, fish, poultry, cottage cheese, vegetables Dill Seed-Meat, cottage cheese, poultry, vegetables, fish, salads, bread Fennel Seed-Bread, cookies, apples, pork, eggs, fish, beets, cabbage, cheese, Licorice-like flavor Garlic-(buds or powder) Salads, meat, poultry, fish, bread, butter, vegetables, potatoes.Do not  use garlic salt Ginger-Fruit, vegetables, baked goods, meat, fish,  poultry Horseradish Root-Meet, vegetables, butter Lemon Juice or Extract-Vegetables, fruit, tea, baked goods, fish salads Mace-Baked goods fruit, vegetables, fish, poultry (taste like nutmeg) Maple Extract-Syrups Marjoram-Meat, chicken, fish, vegetables, breads, green salads (taste like Sage) Mint-Tea, lamb, sherbet, vegetables, desserts, carrots, cabbage Mustard, Dry or Seed-Cheese, eggs, meats, vegetables, poultry Nutmeg-Baked goods, fruit, chicken, eggs, vegetables, desserts Onion Powder-Meat, fish, poultry, vegetables, cheese, eggs, bread, rice salads (Do not use   Onion salt) Orange Extract-Desserts, baked goods Oregano-Pasta, eggs, cheese, onions, pork, lamb, fish, chicken, vegetables, green salads Paprika-Meat, fish, poultry, eggs, cheese, vegetables Parsley Flakes-Butter, vegetables, meat fish, poultry, eggs, bread, salads (certain forms may   Contain sodium Pepper-Meat fish, poultry, vegetables, eggs Peppermint Extract-Desserts, baked goods Poppy Seed-Eggs, bread, cheese, fruit dressings, baked goods, noodles, vegetables, cottage  [employer], poultry, meat, fish, cauliflower, turnips,eggs bread Saffron-Rice, bread, veal, chicken, fish, eggs Sage-Meat, fish, poultry, onions,  eggplant, tomateos, pork, stews Savory-Eggs, salads, poultry, meat, rice, vegetables, soups, pork Tarragon-Meat, poultry, fish, eggs, butter, vegetables (licorice-like flavor)  Thyme-Meat, poultry, fish, eggs, vegetables, (clover-like flavor), sauces, soups Tumeric-Salads, butter, eggs, fish, rice, vegetables (saffron-like flavor) Vanilla Extract-Baked goods, candy Vinegar-Salads, vegetables, meat marinades Walnut Extract-baked goods, candy  2. Choose your Foods Wisely   The following is a list of foods to avoid which are high in sodium:  Meats-Avoid all smoked, canned, salt cured, dried and kosher meat and fish as well as Anchovies   [employer]  meats:Bologna, Liverwurst, Pastrami Canned meat or fish  Marinated herring Caviar    Pepperoni Corned Beef   Pizza Dried chipped beef  Salami Frozen breaded fish or meat Salt pork Frankfurters or hot dogs  Sardines Gefilte fish   Sausage Ham (boiled ham, Proscuitto Smoked butt    spiced ham)   Spam      TV Dinners Vegetables Canned vegetables (Regular) Relish Canned mushrooms  Sauerkraut Olives    Tomato juice Pickles  Bakery and Dessert Products Canned puddings  Cream pies Cheesecake   Decorated cakes Cookies  Beverages/Juices Tomato juice, regular  Gatorade   V-8 vegetable juice, regular  Breads and Cereals Biscuit mixes   Salted potato chips, corn chips, pretzels Bread stuffing mixes  Salted crackers and rolls Pancake and waffle mixes Self-rising flour  Seasonings Accent    Meat sauces Barbecue sauce  Meat tenderizer Catsup    Monosodium glutamate (MSG) Celery salt   Onion salt Chili sauce   Prepared mustard Garlic salt   Salt, seasoned salt, sea salt Gravy mixes   Soy sauce Horseradish   Steak sauce Ketchup   Tartar sauce Lite salt    Teriyaki sauce Marinade mixes   Worcestershire sauce  Others Baking powder   Cocoa and cocoa mixes Baking soda   Commercial casserole mixes Candy-caramels, chocolate  Dehydrated soups    Bars, fudge,nougats  Instant rice and pasta mixes Canned broth or soup  Maraschino cherries Cheese, aged and processed cheese and cheese spreads  Learning Assessment Quiz  Indicated T (for True) or F (for False) for each of the following statements:  1. _____ Fresh fruits and vegetables and unprocessed grains are generally low in sodium 2. _____ Water may contain a considerable amount of sodium, depending on the source 3. _____ You can always tell if a food is high in sodium by tasting it 4. _____ Certain laxatives my be high in sodium and should be avoided unless prescribed   by a physician or pharmacist 5. _____ Salt substitutes may be  used freely by anyone on a sodium restricted diet 6. _____ Sodium is present in table salt, food additives and as a natural component of   most foods 7. _____ Table salt is approximately 90% sodium 8. _____ Limiting sodium intake may help prevent excess fluid accumulation in the body 9. _____ On a sodium-restricted diet, seasonings such as bouillon soy sauce, and    cooking wine should be used in place of table salt 10. _____ On an ingredient list, a product which lists monosodium glutamate as the first   ingredient is an appropriate food to include on a low sodium diet  Circle the best answer(s) to the following statements (Hint: there may be more than one correct answer)  11. On a low-sodium diet, some acceptable snack items are:    A. Olives  F. Bean dip   K. Grapefruit juice    B. Salted  Pretzels G. Commercial Popcorn   L. Canned peaches    C. Carrot Sticks  H. Bouillon   M. Unsalted nuts   D. [place] fries  I. Peanut butter crackers N. Salami   E. Sweet pickles J. Tomato Juice   O. Pizza  12.  Seasonings that may be used freely on a reduced - sodium diet include   A. Lemon wedges F.Monosodium glutamate K. Celery seed    B.Soysauce   G. Pepper   L. Mustard powder   C. Sea salt  H. Cooking wine  M. Onion flakes   D. Vinegar  E. Prepared horseradish N. Salsa   E. Sage   J. Worcestershire sauce  O. [address]      Sumner Boast, PA-C  05/06/2020 12:52 PM    [facility] [address], [place], [place]  [address] Phone: [phone]; Fax: [phone]

## 2020-05-05 NOTE — Patient Instructions (Signed)
Continue taking warfarin 1/2 tablet daily  except for 1 tablet on Monday, Wednesday and Friday. Recheck in 4 weeks.   Call us with any medication changes or concerns # [phone].

## 2020-05-06 ENCOUNTER — Ambulatory Visit (INDEPENDENT_AMBULATORY_CARE_PROVIDER_SITE_OTHER): Payer: Medicare HMO | Admitting: Physician Assistant

## 2020-05-06 ENCOUNTER — Encounter: Payer: Self-pay | Admitting: Physician Assistant

## 2020-05-06 VITALS — BP 106/48 | HR 60 | Ht 70.0 in | Wt 171.0 lb

## 2020-05-06 DIAGNOSIS — I25810 Atherosclerosis of coronary artery bypass graft(s) without angina pectoris: Secondary | ICD-10-CM | POA: Diagnosis not present

## 2020-05-06 DIAGNOSIS — I5043 Acute on chronic combined systolic (congestive) and diastolic (congestive) heart failure: Secondary | ICD-10-CM | POA: Diagnosis not present

## 2020-05-06 DIAGNOSIS — Z95810 Presence of automatic (implantable) cardiac defibrillator: Secondary | ICD-10-CM

## 2020-05-06 DIAGNOSIS — I480 Paroxysmal atrial fibrillation: Secondary | ICD-10-CM

## 2020-05-06 DIAGNOSIS — I255 Ischemic cardiomyopathy: Secondary | ICD-10-CM

## 2020-05-06 NOTE — Addendum Note (Signed)
Addended by: Eulis Foster on: 05/06/2020 01:02 PM   Modules accepted: Orders

## 2020-05-06 NOTE — Patient Instructions (Addendum)
Medication Instructions:  Your physician recommends that you continue on your current medications as directed. Please refer to the Current Medication list given to you today.  INCREASE Demadex to 3 tablets a day for 3 days and then resume 2 tablets daily Increase Potassium to 2 tablets daily and then resume 1 tablet daily  *If you need a refill on your cardiac medications before your next appointment, please call your pharmacy*   Lab Work: TODAY:BMET, BNP  If you have labs (blood work) drawn today and your tests are completely normal, you will receive your results only by: [name] MyChart Message (if you have MyChart) OR . A paper copy in the mail If you have any lab test that is abnormal or we need to change your treatment, we will call you to review the results.   Testing/Procedures: none   Follow-Up: At [facility], you and your health needs are our priority.  As part of our continuing mission to provide you with exceptional heart care, we have created designated Provider Care Teams.  These Care Teams include your primary Cardiologist (physician) and Advanced Practice Providers (APPs -  Physician Assistants and Nurse Practitioners) who all work together to provide you with the care you need, when you need it.   Your next appointment:   4 month(s)  The format for your next appointment:   In Person  Provider:   Sherren Mocha, MD   Other: Two Gram Sodium Diet 2000 mg  What is Sodium? Sodium is a mineral found naturally in many foods. The most significant source of sodium in the diet is table salt, which is about 40% sodium.  Processed, convenience, and preserved foods also contain a large amount of sodium.  The body needs only 500 mg of sodium daily to function,  A normal diet provides more than enough sodium even if you do not use salt.  Why Limit Sodium? A build up of sodium in the body can cause thirst, increased blood pressure, shortness of breath, and water retention.   Decreasing sodium in the diet can reduce edema and risk of heart attack or stroke associated with high blood pressure.  Keep in mind that there are many other factors involved in these health problems.  Heredity, obesity, lack of exercise, cigarette smoking, stress and what you eat all play a role.  General Guidelines:  Do not add salt at the table or in cooking.  One teaspoon of salt contains over 2 grams of sodium.  Read food labels  Avoid processed and convenience foods  Ask your dietitian before eating any foods not dicussed in the menu planning guidelines  Consult your physician if you wish to use a salt substitute or a sodium containing medication such as antacids.  Limit milk and milk products to 16 oz (2 cups) per day.  Shopping Hints:  READ LABELS!! "Dietetic" does not necessarily mean low sodium.  Salt and other sodium ingredients are often added to foods during processing.   Menu Planning Guidelines Food Group Choose More Often Avoid  Beverages (see also the milk group All fruit juices, low-sodium, salt-free vegetables juices, low-sodium carbonated beverages Regular vegetable or tomato juices, commercially softened water used for drinking or cooking  Breads and Cereals Enriched white, wheat, rye and pumpernickel bread, hard rolls and dinner rolls; muffins, cornbread and waffles; most dry cereals, cooked cereal without added salt; unsalted crackers and breadsticks; low sodium or homemade bread crumbs Bread, rolls and crackers with salted tops; quick breads; instant hot  cereals; pancakes; commercial bread stuffing; self-rising flower and biscuit mixes; regular bread crumbs or cracker crumbs  Desserts and Sweets Desserts and sweets mad with mild should be within allowance Instant pudding mixes and cake mixes  Fats Butter or margarine; vegetable oils; unsalted salad dressings, regular salad dressings limited to 1 Tbs; light, sour and heavy cream Regular salad dressings containing  bacon fat, bacon bits, and salt pork; snack dips made with instant soup mixes or processed cheese; salted nuts  Fruits Most fresh, frozen and canned fruits Fruits processed with salt or sodium-containing ingredient (some dried fruits are processed with sodium sulfites        Vegetables Fresh, frozen vegetables and low- sodium canned vegetables Regular canned vegetables, sauerkraut, pickled vegetables, and others prepared in brine; frozen vegetables in sauces; vegetables seasoned with ham, bacon or salt pork  Condiments, Sauces, Miscellaneous  Salt substitute with physician's approval; pepper, herbs, spices; vinegar, lemon or lime juice; hot pepper sauce; garlic powder, onion powder, low sodium soy sauce (1 Tbs.); low sodium condiments (ketchup, chili sauce, mustard) in limited amounts (1 tsp.) fresh ground horseradish; unsalted tortilla chips, pretzels, potato chips, popcorn, salsa (1/4 cup) Any seasoning made with salt including garlic salt, celery salt, onion salt, and seasoned salt; sea salt, rock salt, kosher salt; meat tenderizers; monosodium glutamate; mustard, regular soy sauce, barbecue, sauce, chili sauce, teriyaki sauce, steak sauce, Worcestershire sauce, and most flavored vinegars; canned gravy and mixes; regular condiments; salted snack foods, olives, picles, relish, horseradish sauce, catsup   Food preparation: Try these seasonings Meats:    Pork Sage, onion Serve with applesauce  Chicken Poultry seasoning, thyme, parsley Serve with cranberry sauce  Lamb Curry powder, rosemary, garlic, thyme Serve with mint sauce or jelly  Veal Marjoram, basil Serve with current jelly, cranberry sauce  Beef Pepper, bay leaf Serve with dry mustard, unsalted chive butter  Fish Bay leaf, dill Serve with unsalted lemon butter, unsalted parsley butter  Vegetables:    Asparagus Lemon juice   Broccoli Lemon juice   Carrots Mustard dressing parsley, mint, nutmeg, glazed with unsalted butter and sugar    Green beans Marjoram, lemon juice, nutmeg,dill seed   Tomatoes Basil, marjoram, onion   Spice /blend for [employer]" 4 tsp ground thyme 1 tsp ground sage 3 tsp ground rosemary 4 tsp ground marjoram   Test your knowledge 1. A product that says "Salt Free" may still contain sodium. True or False 2. Garlic Powder and Hot Pepper Sauce an be used as alternative seasonings.True or False 3. Processed foods have more sodium than fresh foods.  True or False 4. Canned Vegetables have less sodium than froze True or False  WAYS TO DECREASE YOUR SODIUM INTAKE 1. Avoid the use of added salt in cooking and at the table.  Table salt (and other prepared seasonings which contain salt) is probably one of the greatest sources of sodium in the diet.  Unsalted foods can gain flavor from the sweet, sour, and butter taste sensations of herbs and spices.  Instead of using salt for seasoning, try the following seasonings with the foods listed.  Remember: how you use them to enhance natural food flavors is limited only by your creativity... Allspice-Meat, fish, eggs, fruit, peas, red and yellow vegetables Almond Extract-Fruit baked goods Anise Seed-Sweet breads, fruit, carrots, beets, cottage cheese, cookies (tastes like licorice) Basil-Meat, fish, eggs, vegetables, rice, vegetables salads, soups, sauces Bay Leaf-Meat, fish, stews, poultry Burnet-Salad, vegetables (cucumber-like flavor) Caraway Seed-Bread, cookies, cottage cheese, meat, vegetables, cheese,  rice Cardamon-Baked goods, fruit, soups Celery Powder or seed-Salads, salad dressings, sauces, meatloaf, soup, bread.Do not use  celery salt Chervil-Meats, salads, fish, eggs, vegetables, cottage cheese (parsley-like flavor) Chili Power-Meatloaf, chicken cheese, corn, eggplant, egg dishes Chives-Salads cottage cheese, egg dishes, soups, vegetables, sauces Cilantro-Salsa, casseroles Cinnamon-Baked goods, fruit, pork, lamb, chicken, carrots Cloves-Fruit, baked  goods, fish, pot roast, green beans, beets, carrots Coriander-Pastry, cookies, meat, salads, cheese (lemon-orange flavor) Cumin-Meatloaf, fish,cheese, eggs, cabbage,fruit pie (caraway flavor) [employer], fruit, eggs, fish, poultry, cottage cheese, vegetables Dill Seed-Meat, cottage cheese, poultry, vegetables, fish, salads, bread Fennel Seed-Bread, cookies, apples, pork, eggs, fish, beets, cabbage, cheese, Licorice-like flavor Garlic-(buds or powder) Salads, meat, poultry, fish, bread, butter, vegetables, potatoes.Do not  use garlic salt Ginger-Fruit, vegetables, baked goods, meat, fish, poultry Horseradish Root-Meet, vegetables, butter Lemon Juice or Extract-Vegetables, fruit, tea, baked goods, fish salads Mace-Baked goods fruit, vegetables, fish, poultry (taste like nutmeg) Maple Extract-Syrups Marjoram-Meat, chicken, fish, vegetables, breads, green salads (taste like Sage) Mint-Tea, lamb, sherbet, vegetables, desserts, carrots, cabbage Mustard, Dry or Seed-Cheese, eggs, meats, vegetables, poultry Nutmeg-Baked goods, fruit, chicken, eggs, vegetables, desserts Onion Powder-Meat, fish, poultry, vegetables, cheese, eggs, bread, rice salads (Do not use   Onion salt) Orange Extract-Desserts, baked goods Oregano-Pasta, eggs, cheese, onions, pork, lamb, fish, chicken, vegetables, green salads Paprika-Meat, fish, poultry, eggs, cheese, vegetables Parsley Flakes-Butter, vegetables, meat fish, poultry, eggs, bread, salads (certain forms may   Contain sodium Pepper-Meat fish, poultry, vegetables, eggs Peppermint Extract-Desserts, baked goods Poppy Seed-Eggs, bread, cheese, fruit dressings, baked goods, noodles, vegetables, cottage  [employer], poultry, meat, fish, cauliflower, turnips,eggs bread Saffron-Rice, bread, veal, chicken, fish, eggs Sage-Meat, fish, poultry, onions, eggplant, tomateos, pork, stews Savory-Eggs, salads, poultry, meat, rice,  vegetables, soups, pork Tarragon-Meat, poultry, fish, eggs, butter, vegetables (licorice-like flavor)  Thyme-Meat, poultry, fish, eggs, vegetables, (clover-like flavor), sauces, soups Tumeric-Salads, butter, eggs, fish, rice, vegetables (saffron-like flavor) Vanilla Extract-Baked goods, candy Vinegar-Salads, vegetables, meat marinades Walnut Extract-baked goods, candy  2. Choose your Foods Wisely   The following is a list of foods to avoid which are high in sodium:  Meats-Avoid all smoked, canned, salt cured, dried and kosher meat and fish as well as Anchovies   [employer] Rx meats:Bologna, Liverwurst, Pastrami Canned meat or fish  Marinated herring Caviar    Pepperoni Corned Beef   Pizza Dried chipped beef  Salami Frozen breaded fish or meat Salt pork Frankfurters or hot dogs  Sardines Gefilte fish   Sausage Ham (boiled ham, Proscuitto Smoked butt    spiced ham)   Spam      TV Dinners Vegetables Canned vegetables (Regular) Relish Canned mushrooms  Sauerkraut Olives    Tomato juice Pickles  Bakery and Dessert Products Canned puddings  Cream pies Cheesecake   Decorated cakes Cookies  Beverages/Juices Tomato juice, regular  Gatorade   V-8 vegetable juice, regular  Breads and Cereals Biscuit mixes   Salted potato chips, corn chips, pretzels Bread stuffing mixes  Salted crackers and rolls Pancake and waffle mixes Self-rising flour  Seasonings Accent    Meat sauces Barbecue sauce  Meat tenderizer Catsup    Monosodium glutamate (MSG) Celery salt   Onion salt Chili sauce   Prepared mustard Garlic salt   Salt, seasoned salt, sea salt Gravy mixes   Soy sauce Horseradish   Steak sauce Ketchup   Tartar sauce Lite salt    Teriyaki sauce Marinade mixes   Worcestershire sauce  Others Baking powder   Cocoa and cocoa mixes Baking  soda   Commercial casserole mixes Candy-caramels, chocolate  Dehydrated soups    Bars, fudge,nougats  Instant rice and pasta  mixes Canned broth or soup  Maraschino cherries Cheese, aged and processed cheese and cheese spreads  Learning Assessment Quiz  Indicated T (for True) or F (for False) for each of the following statements:  1. _____ Fresh fruits and vegetables and unprocessed grains are generally low in sodium 2. _____ Water may contain a considerable amount of sodium, depending on the source 3. _____ You can always tell if a food is high in sodium by tasting it 4. _____ Certain laxatives my be high in sodium and should be avoided unless prescribed   by a physician or pharmacist 5. _____ Salt substitutes may be used freely by anyone on a sodium restricted diet 6. _____ Sodium is present in table salt, food additives and as a natural component of   most foods 7. _____ Table salt is approximately 90% sodium 8. _____ Limiting sodium intake may help prevent excess fluid accumulation in the body 9. _____ On a sodium-restricted diet, seasonings such as bouillon soy sauce, and    cooking wine should be used in place of table salt 10. _____ On an ingredient list, a product which lists monosodium glutamate as the first   ingredient is an appropriate food to include on a low sodium diet  Circle the best answer(s) to the following statements (Hint: there may be more than one correct answer)  11. On a low-sodium diet, some acceptable snack items are:    A. Olives  F. Bean dip   K. Grapefruit juice    B. Salted Pretzels G. Commercial Popcorn   L. Canned peaches    C. Carrot Sticks  H. Bouillon   M. Unsalted nuts   D. [place] fries  I. Peanut butter crackers N. Salami   E. Sweet pickles J. Tomato Juice   O. Pizza  12.  Seasonings that may be used freely on a reduced - sodium diet include   A. Lemon wedges F.Monosodium glutamate K. Celery seed    B.Soysauce   G. Pepper   L. Mustard powder   C. Sea salt  H. Cooking wine  M. Onion flakes   D. Vinegar  E. Prepared horseradish N. Salsa   E. Sage   J.  Worcestershire sauce  O. Chutney

## 2020-05-07 LAB — BASIC METABOLIC PANEL
BUN/Creatinine Ratio: 11 (ref 10–24)
BUN: 20 mg/dL (ref 8–27)
CO2: 26 mmol/L (ref 20–29)
Calcium: 9.1 mg/dL (ref 8.6–10.2)
Chloride: 109 mmol/L — ABNORMAL HIGH (ref 96–106)
Creatinine, Ser: 1.88 mg/dL — ABNORMAL HIGH (ref 0.76–1.27)
Glucose: 138 mg/dL — ABNORMAL HIGH (ref 65–99)
Potassium: 4.4 mmol/L (ref 3.5–5.2)
Sodium: 147 mmol/L — ABNORMAL HIGH (ref 134–144)
eGFR: 34 mL/min/{1.73_m2} — ABNORMAL LOW (ref >59)

## 2020-05-07 LAB — PRO B NATRIURETIC PEPTIDE: NT-Pro BNP: 975 pg/mL — ABNORMAL HIGH (ref 0–486)

## 2020-05-11 ENCOUNTER — Ambulatory Visit (INDEPENDENT_AMBULATORY_CARE_PROVIDER_SITE_OTHER): Payer: Medicare HMO

## 2020-05-11 DIAGNOSIS — Z95810 Presence of automatic (implantable) cardiac defibrillator: Secondary | ICD-10-CM | POA: Diagnosis not present

## 2020-05-11 DIAGNOSIS — I5022 Chronic systolic (congestive) heart failure: Secondary | ICD-10-CM | POA: Diagnosis not present

## 2020-05-12 NOTE — Progress Notes (Signed)
EPIC Encounter for ICM Monitoring  Patient Name: [name] is a 87 y.o. male Date: 05/12/2020 Primary Care Physican: [name], MD Primary Cardiologist:Cooper/Weaver, PA Electrophysiologist: Caryl Comes 05/06/2020 OfficeWeight:171lbs Right Ventricular:96% Left Ventricular:95%    Total Time in AT/AF0 hours  Spoke with wife and reports patient has some leg swelling.  She said she is trying to limit salt intake but patient did have some V8 juice which she knows is very high in sodium.  She said patient has some dementia and he cannot always understand why he cannot have certain foods or drinks.     4/10/2022[employer] Heart Failure Index17suggesting possible fluid accumulation staring 05/07/2020.  Prescribed:  Torsemide 20 mgTake2tablets(40 mg total) by mouth daily.  Per 4/6 OV note with Ermalinda Barrios, PA, patient can take extra Torsemide when needed with extra Potassium.  Potassium 20 mEq 1 tablet daily  Labs: 05/06/2020 Creatinine 1.88, BUN 20, Potassium 4.4, Sodium 147, GFR 34 02/05/2020 Creatinine2.09, BUN28, Potassium4.0, [id number], [id number] 01/22/2020 Creatinine1.75, BUN24, Potassium4.5, Sodium143, [id number] A complete set of results can be found in Results Review.  Recommendations:Wife will give extra Torsemide and Potassium x 3 days as approved by Ermalinda Barrios at last OV.   Follow-up plan: ICM clinic phone appointment on4/20/2022 to recheck fluid levels. 91 day device clinic remote transmission4/21/2022.   EP/Cardiology next office visit:09/16/2020 with Ermalinda Barrios, PA. Recall 12/31/2019 with Dr Caryl Comes  Copy of ICM check sent to Dr. Caryl Comes and Ermalinda Barrios, PA since last OV was with her.  3 Month Trend    Thatcher, RN 05/12/2020 12:05 PM

## 2020-05-12 NOTE — Progress Notes (Signed)
Wife is following recommendations given by Nena Polio, PA.  Wife has eliminated V8 for his diet and attempting to limit salt intake.

## 2020-05-12 NOTE — Progress Notes (Signed)
Pt's Wife is aware and will make these med changes.

## 2020-05-13 ENCOUNTER — Other Ambulatory Visit: Payer: Self-pay | Admitting: Family Medicine

## 2020-05-20 ENCOUNTER — Ambulatory Visit (INDEPENDENT_AMBULATORY_CARE_PROVIDER_SITE_OTHER): Payer: Medicare HMO

## 2020-05-20 DIAGNOSIS — Z95810 Presence of automatic (implantable) cardiac defibrillator: Secondary | ICD-10-CM

## 2020-05-20 DIAGNOSIS — I5022 Chronic systolic (congestive) heart failure: Secondary | ICD-10-CM

## 2020-05-21 ENCOUNTER — Ambulatory Visit (INDEPENDENT_AMBULATORY_CARE_PROVIDER_SITE_OTHER): Payer: Medicare HMO

## 2020-05-21 DIAGNOSIS — I255 Ischemic cardiomyopathy: Secondary | ICD-10-CM | POA: Diagnosis not present

## 2020-05-21 LAB — CUP PACEART REMOTE DEVICE CHECK
Battery Remaining Longevity: 90 mo
Battery Remaining Percentage: 98 %
Brady Statistic RA Percent Paced: 92 %
Brady Statistic RV Percent Paced: 96 %
Date Time Interrogation Session: 20220421012100
HighPow Impedance: 35 Ohm
Implantable Lead Implant Date: 20060808
Implantable Lead Implant Date: 20060808
Implantable Lead Implant Date: 20060911
Implantable Lead Location: 753858
Implantable Lead Location: 753859
Implantable Lead Location: 753860
Implantable Lead Model: 158
Implantable Lead Model: 4087
Implantable Lead Serial Number: 165392
Implantable Lead Serial Number: 261368
Implantable Pulse Generator Implant Date: 20190923
Lead Channel Impedance Value: 333 Ohm
Lead Channel Impedance Value: 406 Ohm
Lead Channel Impedance Value: 413 Ohm
Lead Channel Setting Pacing Amplitude: 2 V
Lead Channel Setting Pacing Amplitude: 2.3 V
Lead Channel Setting Pacing Amplitude: 2.4 V
Lead Channel Setting Pacing Pulse Width: 0.4 ms
Lead Channel Setting Pacing Pulse Width: 0.8 ms
Lead Channel Setting Sensing Sensitivity: 0.5 mV
Lead Channel Setting Sensing Sensitivity: 1 mV
Pulse Gen Serial Number: 134188

## 2020-05-22 NOTE — Progress Notes (Signed)
EPIC Encounter for ICM Monitoring  Patient Name: [name] is a 87 y.o. male Date: 05/22/2020 Primary Care Physican: [name], MD Primary Cardiologist:Cooper/Weaver, PA Electrophysiologist: Caryl Comes 05/06/2020 OfficeWeight:171lbs Right Ventricular:96% Left Ventricular:95%    Total Time in AT/AF0 hours  Transmission reviewed.  4/20/2022[employer] Heart Failure Index17suggesting possible fluid accumulation starting 05/07/2020.  Prescribed:  Torsemide 20 mgTake2tablets(40 mg total) by mouth daily.  Per 4/6 OV note with Ermalinda Barrios, PA, patient can take extra Torsemide when needed with extra Potassium.  Potassium 20 mEq 1 tablet daily  Labs: 05/06/2020 Creatinine 1.88, BUN 20, Potassium 4.4, Sodium 147, GFR 34 02/05/2020 Creatinine2.09, BUN28, Potassium4.0, [id number], [id number] 01/22/2020 Creatinine1.75, BUN24, Potassium4.5, Sodium143, [id number] A complete set of results can be found in Results Review.  Recommendations: No changes  Follow-up plan: ICM clinic phone appointment on5/03/2020 to recheck fluid levels. 91 day device clinic remote transmission7/21/2022.   EP/Cardiology next office visit:09/16/2020 with Ermalinda Barrios, PA. Recall 12/31/2019 with Dr Caryl Comes  Copy of ICM check sent to Dr. Caryl Comes  3 Month Trend    Tovey, RN 05/22/2020 5:19 PM

## 2020-06-01 ENCOUNTER — Ambulatory Visit (INDEPENDENT_AMBULATORY_CARE_PROVIDER_SITE_OTHER): Payer: Medicare HMO

## 2020-06-01 DIAGNOSIS — I5022 Chronic systolic (congestive) heart failure: Secondary | ICD-10-CM

## 2020-06-01 DIAGNOSIS — Z95810 Presence of automatic (implantable) cardiac defibrillator: Secondary | ICD-10-CM

## 2020-06-02 ENCOUNTER — Other Ambulatory Visit: Payer: Self-pay | Admitting: Family Medicine

## 2020-06-02 ENCOUNTER — Other Ambulatory Visit: Payer: Self-pay

## 2020-06-02 ENCOUNTER — Ambulatory Visit: Payer: Medicare HMO

## 2020-06-02 DIAGNOSIS — Z0000 Encounter for general adult medical examination without abnormal findings: Secondary | ICD-10-CM

## 2020-06-02 DIAGNOSIS — Z5181 Encounter for therapeutic drug level monitoring: Secondary | ICD-10-CM

## 2020-06-02 DIAGNOSIS — K55069 Acute infarction of intestine, part and extent unspecified: Secondary | ICD-10-CM

## 2020-06-02 LAB — POCT INR: INR: 2.5 (ref 2.0–3.0)

## 2020-06-02 NOTE — Progress Notes (Signed)
EPIC Encounter for ICM Monitoring  Patient Name: [name] is a 87 y.o. male Date: 06/02/2020 Primary Care Physican: [name], MD Primary Cardiologist:Cooper/Weaver, PA Electrophysiologist: Caryl Comes 05/06/2020 OfficeWeight:171lbs Right Ventricular:96% Left Ventricular:95%    Total Time in AT/AF0 hours  Transmission reviewed.  5/1/2022[employer]Log[employer] Heart Failure Index12suggestingfluid levels within normal limits.  Prescribed:  Torsemide 20 mgTake2tablets(40 mg total) by mouth daily.Per 4/6 OV note with Ermalinda Barrios, PA, patient can take extra Torsemide when needed with extra Potassium.  Potassium 20 mEq 1 tablet daily  Labs: 05/06/2020 Creatinine 1.88, BUN 20, Potassium 4.4, Sodium 147, GFR 34 02/05/2020 Creatinine2.09, BUN28, Potassium4.0, [id number], [id number] 01/22/2020 Creatinine1.75, BUN24, Potassium4.5, Sodium143, [id number] A complete set of results can be found in Results Review.  Recommendations: No changes  Follow-up plan: ICM clinic phone appointment on6/13/2022. 91 day device clinic remote transmission7/21/2022.   EP/Cardiology next office visit:09/16/2020 with Ermalinda Barrios, PA. Recall 12/31/2019 with Dr Caryl Comes  Copy of ICM check sent to Dr. Caryl Comes  3 Month Trend    Rolla, RN 06/02/2020 9:05 AM

## 2020-06-02 NOTE — Patient Instructions (Signed)
Continue taking warfarin 1/2 tablet daily  except for 1 tablet on Monday, Wednesday and Friday. Recheck in 6 weeks.   Call us with any medication changes or concerns # [phone].

## 2020-06-08 NOTE — Progress Notes (Signed)
Remote ICD transmission.   

## 2020-07-04 ENCOUNTER — Other Ambulatory Visit: Payer: Self-pay | Admitting: Family Medicine

## 2020-07-09 ENCOUNTER — Telehealth: Payer: Medicare HMO | Admitting: Family Medicine

## 2020-07-13 ENCOUNTER — Ambulatory Visit (INDEPENDENT_AMBULATORY_CARE_PROVIDER_SITE_OTHER): Payer: Medicare HMO

## 2020-07-13 DIAGNOSIS — I5022 Chronic systolic (congestive) heart failure: Secondary | ICD-10-CM

## 2020-07-13 DIAGNOSIS — Z95810 Presence of automatic (implantable) cardiac defibrillator: Secondary | ICD-10-CM

## 2020-07-14 ENCOUNTER — Ambulatory Visit: Payer: Medicare HMO

## 2020-07-14 ENCOUNTER — Other Ambulatory Visit: Payer: Self-pay

## 2020-07-14 DIAGNOSIS — Z5181 Encounter for therapeutic drug level monitoring: Secondary | ICD-10-CM | POA: Diagnosis not present

## 2020-07-14 DIAGNOSIS — K55069 Acute infarction of intestine, part and extent unspecified: Secondary | ICD-10-CM | POA: Diagnosis not present

## 2020-07-14 DIAGNOSIS — Z0000 Encounter for general adult medical examination without abnormal findings: Secondary | ICD-10-CM

## 2020-07-14 LAB — POCT INR: INR: 4.9 — AB (ref 2.0–3.0)

## 2020-07-14 NOTE — Progress Notes (Signed)
EPIC Encounter for ICM Monitoring  Patient Name: [name] is a 87 y.o. male Date: 07/14/2020 Primary Care Physican: [name], MD Primary Cardiologist: [place], [place] Electrophysiologist: Caryl Comes 05/06/2020 Office Weight: 171 lbs Right Ventricular: 96% Left Ventricular:   95%     Total Time in AT/AF  0 hours         Transmission reviewed.   07/12/2020 [facility] Heart Failure Index 0 suggesting fluid levels within normal limits.     Prescribed:  Torsemide 20 mg Take 2 tablets (40 mg total) by mouth daily.  Per 4/6 OV note with Ermalinda Barrios, PA, patient can take extra Torsemide when needed with extra Potassium. Potassium 20 mEq 1 tablet daily   Labs: 05/06/2020 Creatinine 1.88, BUN 20, Potassium 4.4, Sodium 147, GFR 34 02/05/2020 Creatinine 2.09, BUN 28, Potassium 4.0, Sodium 143, GFR 28-32 01/22/2020 Creatinine 1.75, BUN 24, Potassium 4.5, Sodium 143, GFR 34-40  A complete set of results can be found in Results Review.   Recommendations:  No changes   Follow-up plan: ICM clinic phone appointment on 08/17/2020.   91 day device clinic remote transmission 08/20/2020.              EP/Cardiology next office visit:   09/16/2020 with Ermalinda Barrios, PA.  Recall 12/31/2019 with Dr Caryl Comes          Copy of ICM check sent to Dr. Caryl Comes  [place]    [place], RN 07/14/2020 8:28 AM

## 2020-07-14 NOTE — Patient Instructions (Signed)
Hold today and Wednesday and then Continue taking warfarin 1/2 tablet daily  except for 1 tablet on Monday, Wednesday and Friday. Recheck in 6 weeks.   Call us with any medication changes or concerns # [phone].

## 2020-07-15 ENCOUNTER — Other Ambulatory Visit: Payer: Self-pay | Admitting: Family Medicine

## 2020-07-15 DIAGNOSIS — J302 Other seasonal allergic rhinitis: Secondary | ICD-10-CM

## 2020-07-28 ENCOUNTER — Other Ambulatory Visit: Payer: Self-pay | Admitting: Cardiovascular Disease

## 2020-08-17 ENCOUNTER — Ambulatory Visit (INDEPENDENT_AMBULATORY_CARE_PROVIDER_SITE_OTHER): Payer: Medicare HMO

## 2020-08-17 ENCOUNTER — Other Ambulatory Visit: Payer: Self-pay | Admitting: Family Medicine

## 2020-08-17 DIAGNOSIS — Z95810 Presence of automatic (implantable) cardiac defibrillator: Secondary | ICD-10-CM

## 2020-08-17 DIAGNOSIS — I5022 Chronic systolic (congestive) heart failure: Secondary | ICD-10-CM | POA: Diagnosis not present

## 2020-08-17 DIAGNOSIS — J302 Other seasonal allergic rhinitis: Secondary | ICD-10-CM

## 2020-08-18 ENCOUNTER — Telehealth: Payer: Self-pay

## 2020-08-18 MED ORDER — WARFARIN SODIUM 5 MG PO TABS: ORAL_TABLET | ORAL | 0 refills | Status: AC

## 2020-08-18 NOTE — Telephone Encounter (Signed)
granted

## 2020-08-19 NOTE — Progress Notes (Signed)
EPIC Encounter for ICM Monitoring  Patient Name: [name] is a 87 y.o. male Date: 08/19/2020 Primary Care Physican: [name], MD Primary Cardiologist: [place], [place] Electrophysiologist: Caryl Comes 05/06/2020 Office Weight: 171 lbs Right Ventricular: 96% Left Ventricular:   95%   Total Time in AT/AF  0 hours         Transmission reviewed.   08/15/2020 [facility] Heart Failure Index 6 suggesting fluid levels within normal limits.     Prescribed:  Torsemide 20 mg Take 2 tablets (40 mg total) by mouth daily.  Per 4/6 OV note with Ermalinda Barrios, PA, patient can take extra Torsemide when needed with extra Potassium. Potassium 20 mEq 1 tablet daily   Labs: 05/06/2020 Creatinine 1.88, BUN 20, Potassium 4.4, Sodium 147, GFR 34 02/05/2020 Creatinine 2.09, BUN 28, Potassium 4.0, Sodium 143, GFR 28-32 01/22/2020 Creatinine 1.75, BUN 24, Potassium 4.5, Sodium 143, GFR 34-40  A complete set of results can be found in Results Review.   Recommendations:  No changes   Follow-up plan: ICM clinic phone appointment on 09/21/2020.   91 day device clinic remote transmission 11/19/2020.              EP/Cardiology next office visit:   09/16/2020 with Ermalinda Barrios, PA.  08/24/2020 with Dr Caryl Comes          Copy of ICM check sent to Dr. Caryl Comes.   3 month ICM trend: 08/16/2020.    1 Year ICM trend:       Rosalene Billings, RN 08/19/2020 7:50 AM

## 2020-08-20 ENCOUNTER — Ambulatory Visit (INDEPENDENT_AMBULATORY_CARE_PROVIDER_SITE_OTHER): Payer: Medicare HMO

## 2020-08-20 DIAGNOSIS — I255 Ischemic cardiomyopathy: Secondary | ICD-10-CM

## 2020-08-20 LAB — CUP PACEART REMOTE DEVICE CHECK
Battery Remaining Longevity: 84 mo
Battery Remaining Percentage: 97 %
Brady Statistic RA Percent Paced: 92 %
Brady Statistic RV Percent Paced: 96 %
Date Time Interrogation Session: 20220718012200
HighPow Impedance: 31 Ohm
Implantable Lead Implant Date: 20060808
Implantable Lead Implant Date: 20060808
Implantable Lead Implant Date: 20060911
Implantable Lead Location: 753858
Implantable Lead Location: 753859
Implantable Lead Location: 753860
Implantable Lead Model: 158
Implantable Lead Model: 4087
Implantable Lead Serial Number: 165392
Implantable Lead Serial Number: 261368
Implantable Pulse Generator Implant Date: 20190923
Lead Channel Impedance Value: 321 Ohm
Lead Channel Impedance Value: 402 Ohm
Lead Channel Impedance Value: 431 Ohm
Lead Channel Setting Pacing Amplitude: 2 V
Lead Channel Setting Pacing Amplitude: 2.3 V
Lead Channel Setting Pacing Amplitude: 2.4 V
Lead Channel Setting Pacing Pulse Width: 0.4 ms
Lead Channel Setting Pacing Pulse Width: 0.8 ms
Lead Channel Setting Sensing Sensitivity: 0.5 mV
Lead Channel Setting Sensing Sensitivity: 1 mV
Pulse Gen Serial Number: 134188

## 2020-08-24 ENCOUNTER — Other Ambulatory Visit: Payer: Self-pay

## 2020-08-24 ENCOUNTER — Ambulatory Visit (INDEPENDENT_AMBULATORY_CARE_PROVIDER_SITE_OTHER): Payer: Medicare HMO | Admitting: Internal Medicine

## 2020-08-24 ENCOUNTER — Encounter: Payer: Self-pay | Admitting: Internal Medicine

## 2020-08-24 VITALS — BP 82/50 | HR 62 | Ht 70.0 in | Wt 169.0 lb

## 2020-08-24 DIAGNOSIS — I255 Ischemic cardiomyopathy: Secondary | ICD-10-CM

## 2020-08-24 DIAGNOSIS — I5022 Chronic systolic (congestive) heart failure: Secondary | ICD-10-CM

## 2020-08-24 DIAGNOSIS — Z95810 Presence of automatic (implantable) cardiac defibrillator: Secondary | ICD-10-CM

## 2020-08-24 MED ORDER — TORSEMIDE 20 MG PO TABS: ORAL_TABLET | ORAL | 1 refills | Status: DC

## 2020-08-24 MED ORDER — LISINOPRIL 2.5 MG PO TABS: 2.5000 mg | ORAL_TABLET | Freq: Every day | ORAL | 3 refills | Status: DC

## 2020-08-24 NOTE — Patient Instructions (Addendum)
Medication Instructions:  Your physician has recommended you make the following change in your medication:   Decrease your Lisinopril to 2.8m - 1 tablet by mouth daily.  Begin Torsemide 883m(4-2062mablets) by mouth x 3 days.  Then you will alternate between 18m25md 40mg65mry other day. You will give 18mg 81mday and 40mg t42mext.   *If you need a refill on your cardiac medications before your next appointment, please call your pharmacy*   Lab Work: CBC and BMET by PCP 08/25/2020 - Rx given  If you have labs (blood work) drawn today and your tests are completely normal, you will receive your results only by: [place] MyChart) OR A paper copy in the mail If you have any lab test that is abnormal or we need to change your treatment, we will call you to review the results.   Testing/Procedures: None ordered.    Follow-Up: [facility] your [facility] needs are our priority.  As part of our continuing mission to provide you with exceptional heart care, we have created designated Provider Care Teams.  These Care Teams include your primary Cardiologist (physician) and Advanced Practice Providers (APPs -  Physician Assistants and Nurse Practitioners) who all work together to provide you with the care you need, when you need it.  We recommend signing up for the patient portal called "MyChart".  Sign up information is provided on this After Visit Summary.  MyChart is used to connect with patients for Virtual Visits (Telemedicine).  Patients are able to view lab/test results, encounter notes, upcoming appointments, etc.  Non-urgent messages can be sent to your provider as well.   To learn more about what you can do with MyChart, go to [email or web address] next appointment:   2 weeks with Scott WRichardson Doppto be scheduled   Other Instructions:  Call your insurance company to discuss cost of the following blood thinners.  Eliquis, or  Xarelto   Check the cost of these medications on line in [place]

## 2020-08-24 NOTE — Progress Notes (Signed)
Patient Care Team: [name], MD as PCP - General (Family Medicine) Sherren Mocha, MD as PCP - Cardiology (Cardiology) Deboraha Sprang, MD as PCP - Electrophysiology (Cardiology) Pyrtle, Lajuan Lines, MD as Consulting Physician (Gastroenterology) Darleen Crocker, MD as Consulting Physician (Ophthalmology) Dorothy Spark, MD (Inactive) as Consulting Physician (Cardiology) Veva Holes as Consulting Physician (Urology) Waynetta Sandy, MD as Consulting Physician (Vascular Surgery)   HPI  [name] is a 87 y.o. male ICD implanted for primary prevention in the setting of ischemic coronary disease with prior CABG as part of the MADIT-CRT trial.   He has chronic thrombocytopenia and more recently noted to have anemia.  Last note evident in epic is from 2015  His last catheterization in 2006 demonstrated a total right and 80% marginal. Vein graft to the diagonal was occluded and other bypasses were open; ejection fraction 2007 was about 35%  Nuclear scan Oct 2011>>scar no ischemia  Echo 10/16  EF 35-40%     9/19 presented with device failure secondary to battery--CRT-D  Date Cr K  Hgb Plt  8/18 1.36 3.6 14.1    9/19 1.05 4.2 13.8   8/20 1.16 4.9 13.7(2/20) 57  4/22 1.88 4.4 9 (1/22) 54           He speaks little; his wife says he feels terrible.  She can tell this by the fact that he sits in his chair and says nothing.  He denies chest pain.  He has some shortness of breath.  He has significant peripheral edema.  This improved briefly with increasing diuretics; it was then decreased because of concerns about renal function.  Edema has been very problematic.  Up to his thighs. No palpitations.  Losing weight, no appetite.  Was on boost but had to stop because of the vitamin K content     Past Medical History:  Diagnosis Date   AAA (abdominal aortic aneurysm) ([place])    Abdominal pain 04/14/2011   Acute bronchitis 09/02/2013   Acute vascular  insufficiency of intestine (HCC)    Mesenteric embolus...surgery 2003   Allergic rhinitis 09/09/2013   Anxiety    Aortic atherosclerosis ([place]) 11/10/2015   Atrial fibrillation (HCC)    paroxysmal   Back pain 08/08/2016   Biventricular implantable cardiac defibrillator in situ    GDT CONTAK H170-MADIT-CRT-EXPLANTED 2011 implanted defibrillator- Guidant cognis, model n119-2001 Dr. Caryl Comes (11/28/2009)   Breast tenderness    Spironolactone was stopped and eplerinone started. Patient feels better   CAD (coronary artery disease)    CABG 1994  /   catheterization 1996, occluded vein graft to the diagonal, other grafts patent  /   catheterization 2006, no PCI  /  nuclear, October, 2011, extensive scar anteroseptal and apical, no ischemia   Carotid artery disease ([place])    Doppler, February,  2012, 0-39% bilateral, recommended followup 1 year   Chronic systolic heart failure ([place])    chronic systolic    Chronotropic incompetence    Cirrhosis, nonalcoholic ([place]) 5/62/5638   Colonic polyp 2010   pathology not clear.    Crohn's disease ([place])    Degenerative joint disease    Dermatitis 04/05/2016   Dizziness    Positional dizziness   Edema leg    Venous Dopplers 8/13: No DVT bilaterally   Ejection fraction    EF 35%, echo, November, 2011  //   Is 35-40%, echo,  September 23, 2011    Fatigue 06/25/2008  Qualifier: Diagnosis of  By: [name], MD, Leonidas Romberg Dorinda Hill    GERD 02/21/2007   Qualifier: Diagnosis of  By: Ronnald Ramp CNA/MA, Jessica     Hyperglycemia 08/08/2016   Hyperlipemia    Hypertension    Hypertrophy of prostate with urinary obstruction and other lower urinary tract symptoms (LUTS)    Hypoalbuminemia 06/16/2013   Hypokalemia 06/18/2013   Implantable cardioverter-defibrillator (ICD) in situ 12/09/2009   Annotation: EXPLANTED 2011   //   New Guidant ICD implanted 2011      Iron deficiency anemia    Ischemic cardiomyopathy    CABG 1994 CAD catherization 1996.[name] occluded vein graft to the diagnol,  other grafts patent/  catherization 2006, no PCI/ nuclear.[name] october 2011.[name] extensive scar anteroseptal and apical.. 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 november 2009/ ef 35%.[name] echo november 2011   Oral thrush 02/05/2015   OSA (obstructive sleep apnea) 09/16/2011   NPSG 2013:  AHI 34/hr.     Polymyalgia rheumatica (HCC)    Shingles    Sinus bradycardia    Skin cancer 08/08/2016   Thrombocytopenia ([place]) 2008   Thrush    Tremor    Ventral hernia    multiple, wears an abd binder, reports he hs been told he was at increased riisk for surgery   Vitamin B12 deficiency    Vitamin D deficiency 11/06/2008   Qualifier: Diagnosis of  By: Lenna Gilford MD, Deborra Medina    Warfarin anticoagulation    Coumadin therapy for mesenteric embolus 2003    Past Surgical History:  Procedure Laterality Date   BIV ICD GENERATOR CHANGEOUT N/A 10/23/2017   Procedure: BIV ICD GENERATOR CHANGEOUT;  Surgeon: Deboraha Sprang, MD;  Location: [place] CV LAB;  Service: Cardiovascular;  Laterality: N/A;   CATARACT EXTRACTION, BILATERAL  2014   COLONOSCOPY N/A 04/19/2013   Procedure: COLONOSCOPY;  Surgeon: Jerene Bears, MD;  Location[name] ENDOSCOPY;  Service: Endoscopy;  Laterality: N/A;   CORONARY ARTERY BYPASS GRAFT  1994   Elap w/ superior mesenteric artery embolectomy  1/03   by Dr. Jennette Banker   ESOPHAGOGASTRODUODENOSCOPY N/A 04/18/2013   Procedure: ESOPHAGOGASTRODUODENOSCOPY (EGD);  Surgeon: Jerene Bears, MD;  Location: [facility] ENDOSCOPY;  Service: Endoscopy;  Laterality: N/A;   implantation of biventric cardioverter-defibrillatorr     by Dr. Lovena Le in 8/06 Guidant Contak H170-explanted 2011   implantation of guidant cognis device     model n119-2011   laparoscopic cholecystectomy  2002   left inguinal hernia repair with mesh  6/08   by Dr. Betsy Pries   PROSTATE SURGERY   08/2012   by urology in [place]   Repair of incarcerated ventral hernia and lysis of adhesions  11/03   by Dr. Betsy Pries    Current Outpatient Medications  Medication Sig Dispense Refill   acetaminophen (TYLENOL) 650 MG CR tablet Take 650 mg by mouth daily as needed for pain.     albuterol (VENTOLIN HFA) 108 (90 Base) MCG/ACT inhaler Inhale 2 puffs into the lungs every 4 (four) hours as needed for wheezing or shortness of breath. 18 g 1   Ascorbic Acid (VITAMIN C) 100 MG tablet Take 100 mg by mouth daily.     carvedilol (COREG) 6.25 MG tablet TAKE 1 TABLET BY MOUTH TWICE DAILY WITH A MEAL 180 tablet 0  cholecalciferol (VITAMIN D) 1000 UNITS tablet Take 1,000 Units by mouth every evening.     cholestyramine (QUESTRAN) 4 g packet Take 1 packet (4 g total) by mouth daily. 30 each 3   donepezil (ARICEPT) 10 MG tablet Take 1 tablet (10 mg total) by mouth at bedtime. 90 tablet 3   fluticasone (FLOVENT HFA) 220 MCG/ACT inhaler Inhale 1 puff into the lungs in the morning and at bedtime. 1 each 1   hydrocortisone cream 1 % Apply 1 application topically daily as needed for itching.     levocetirizine (XYZAL) 5 MG tablet TAKE 1 TABLET BY MOUTH ONCE DAILY IN THE EVENING 30 tablet 0   lisinopril (ZESTRIL) 10 MG tablet Take 1 tablet by mouth once daily 90 tablet 2   Loperamide HCl (LOPERAMIDE A-D PO) Take 1 tablet by mouth 2 (two) times daily as needed (Diarrhea).     magnesium gluconate (MAGONATE) 500 MG tablet Take 500 mg by mouth as needed (cramps).     memantine (NAMENDA) 10 MG tablet Take 1 tablet by mouth twice daily 60 tablet 5   Menthol, Topical Analgesic, (BIOFREEZE EX) Apply 1 application topically daily as needed (pain).     pantoprazole (PROTONIX) 40 MG tablet TAKE 1 TABLET BY MOUTH ONCE DAILY AT NOON 90 tablet 3   potassium chloride SA (KLOR-CON) 20 MEQ tablet Take 1 tablet (20 mEq total) by mouth daily. 90 tablet 2   Simethicone (GAS-X MAXIMUM STRENGTH PO) Take 1-2 tablets by mouth daily as  needed (Flatulence).     simvastatin (ZOCOR) 20 MG tablet TAKE 1 TABLET BY MOUTH AT BEDTIME 90 tablet 0   vitamin B-12 (CYANOCOBALAMIN) 1000 MCG tablet Take 500 mcg by mouth daily.      warfarin (COUMADIN) 5 MG tablet TAKE 1/2-1 TABLET DAILY  AS DIRECTED BY THE COUMADIN CLINIC 90 tablet 0   torsemide (DEMADEX) 20 MG tablet Take 2 tablets (40 mg total) by mouth daily. 60 tablet 6   No current facility-administered medications for this visit.    No Known Allergies  Review of Systems negative except from HPI and PMH  Physical Exam BP (!) 82/50 (BP Location: Left Arm, Patient Position: Sitting, Cuff Size: Normal)   Pulse 62   Ht 5' 10"  (1.778 m)   Wt 169 lb (76.7 kg)   SpO2 96%   BMI 24.25 kg/m  Well developed and well nourished in no acute distress HENT normal Neck supple   Clear Presacral edema Wearing abdominal binder because of recurrent hernias Device pocket well healed; without hematoma or erythema.  There is no tethering  Regular rate and rhythm, no murmur Abd-soft with active BS No Clubbing cyanosis 3+ edema Skin-warm and dry A & Oriented  Grossly normal sensory and motor function  ECG sinus with P synchronous pacing in upright QRS in lead V1 and negative QRS lead I     Assessment and  Plan  Ischemic cardiomyopathy   Implantable defibrillator-[employer] CRT -   Congestive heart-chronic-systolic  Anemia  Thrombocytopenia chronic No symptoms of ischemia.  As he is on warfarin we will not use aspirin.  Continue his Zocor at 20 mg.  Significantly volume overloaded.  We will try again to diurese him by increasing his torsemide from 40--80 x 3 days and then alternating.  We will have to check a metabolic profile.  Volume overloaded we will increase his furosemide from 40--80.x 3 d  He does not urinate very briskly with 40 mg  Anemia has been present  over the last couple of blood draws.  We will check a CBC.  This could certainly be aggravating.  Device  function is normal.  At the next visit we will have to discuss high-voltage therapies and and whether they should be activated

## 2020-08-25 ENCOUNTER — Other Ambulatory Visit: Payer: Self-pay

## 2020-08-25 ENCOUNTER — Ambulatory Visit: Payer: Medicare HMO

## 2020-08-25 DIAGNOSIS — Z5181 Encounter for therapeutic drug level monitoring: Secondary | ICD-10-CM | POA: Diagnosis not present

## 2020-08-25 DIAGNOSIS — K55069 Acute infarction of intestine, part and extent unspecified: Secondary | ICD-10-CM | POA: Diagnosis not present

## 2020-08-25 DIAGNOSIS — I255 Ischemic cardiomyopathy: Secondary | ICD-10-CM

## 2020-08-25 DIAGNOSIS — I5022 Chronic systolic (congestive) heart failure: Secondary | ICD-10-CM | POA: Diagnosis not present

## 2020-08-25 DIAGNOSIS — Z95810 Presence of automatic (implantable) cardiac defibrillator: Secondary | ICD-10-CM | POA: Diagnosis not present

## 2020-08-25 DIAGNOSIS — Z0000 Encounter for general adult medical examination without abnormal findings: Secondary | ICD-10-CM

## 2020-08-25 LAB — POCT INR: INR: 4.2 — AB (ref 2.0–3.0)

## 2020-08-25 NOTE — Addendum Note (Signed)
Addended by: Darrell Jewel on: 08/25/2020 01:17 PM   Modules accepted: Orders

## 2020-08-25 NOTE — Patient Instructions (Signed)
Hold today and Wednesday and then decrease warfarin 1/2 tablet daily  except for 1 tablet on Monday and Friday. Recheck in 2 weeks.   Call us with any medication changes or concerns # [phone].

## 2020-08-26 ENCOUNTER — Other Ambulatory Visit: Payer: Self-pay

## 2020-08-26 ENCOUNTER — Telehealth: Payer: Self-pay

## 2020-08-26 ENCOUNTER — Inpatient Hospital Stay (HOSPITAL_COMMUNITY)
Admission: EM | Admit: 2020-08-26 | Discharge: 2020-08-30 | DRG: 682 | Disposition: A | Discharge status: Hospice | Payer: Medicare HMO | Attending: Internal Medicine | Admitting: Internal Medicine

## 2020-08-26 ENCOUNTER — Emergency Department (HOSPITAL_COMMUNITY): Payer: Medicare HMO

## 2020-08-26 ENCOUNTER — Encounter (HOSPITAL_COMMUNITY): Payer: Self-pay | Admitting: *Deleted

## 2020-08-26 DIAGNOSIS — K7460 Unspecified cirrhosis of liver: Secondary | ICD-10-CM | POA: Diagnosis not present

## 2020-08-26 DIAGNOSIS — Z20822 Contact with and (suspected) exposure to covid-19: Secondary | ICD-10-CM | POA: Diagnosis present

## 2020-08-26 DIAGNOSIS — I130 Hypertensive heart and chronic kidney disease with heart failure and stage 1 through stage 4 chronic kidney disease, or unspecified chronic kidney disease: Secondary | ICD-10-CM | POA: Diagnosis present

## 2020-08-26 DIAGNOSIS — Z79899 Other long term (current) drug therapy: Secondary | ICD-10-CM

## 2020-08-26 DIAGNOSIS — R630 Anorexia: Secondary | ICD-10-CM | POA: Diagnosis present

## 2020-08-26 DIAGNOSIS — K7469 Other cirrhosis of liver: Secondary | ICD-10-CM | POA: Diagnosis present

## 2020-08-26 DIAGNOSIS — N179 Acute kidney failure, unspecified: Principal | ICD-10-CM | POA: Diagnosis present

## 2020-08-26 DIAGNOSIS — R791 Abnormal coagulation profile: Secondary | ICD-10-CM

## 2020-08-26 DIAGNOSIS — I480 Paroxysmal atrial fibrillation: Secondary | ICD-10-CM | POA: Diagnosis present

## 2020-08-26 DIAGNOSIS — Z87891 Personal history of nicotine dependence: Secondary | ICD-10-CM

## 2020-08-26 DIAGNOSIS — K55069 Acute infarction of intestine, part and extent unspecified: Secondary | ICD-10-CM | POA: Diagnosis present

## 2020-08-26 DIAGNOSIS — Z66 Do not resuscitate: Secondary | ICD-10-CM | POA: Diagnosis not present

## 2020-08-26 DIAGNOSIS — K7290 Hepatic failure, unspecified without coma: Secondary | ICD-10-CM | POA: Diagnosis not present

## 2020-08-26 DIAGNOSIS — F0390 Unspecified dementia without behavioral disturbance: Secondary | ICD-10-CM | POA: Diagnosis present

## 2020-08-26 DIAGNOSIS — Z6824 Body mass index (BMI) 24.0-24.9, adult: Secondary | ICD-10-CM

## 2020-08-26 DIAGNOSIS — Z806 Family history of leukemia: Secondary | ICD-10-CM

## 2020-08-26 DIAGNOSIS — Z8249 Family history of ischemic heart disease and other diseases of the circulatory system: Secondary | ICD-10-CM

## 2020-08-26 DIAGNOSIS — W57XXXA Bitten or stung by nonvenomous insect and other nonvenomous arthropods, initial encounter: Secondary | ICD-10-CM | POA: Diagnosis present

## 2020-08-26 DIAGNOSIS — N184 Chronic kidney disease, stage 4 (severe): Secondary | ICD-10-CM | POA: Diagnosis present

## 2020-08-26 DIAGNOSIS — I959 Hypotension, unspecified: Secondary | ICD-10-CM | POA: Diagnosis present

## 2020-08-26 DIAGNOSIS — Z7901 Long term (current) use of anticoagulants: Secondary | ICD-10-CM

## 2020-08-26 DIAGNOSIS — I2510 Atherosclerotic heart disease of native coronary artery without angina pectoris: Secondary | ICD-10-CM | POA: Diagnosis present

## 2020-08-26 DIAGNOSIS — Z95810 Presence of automatic (implantable) cardiac defibrillator: Secondary | ICD-10-CM

## 2020-08-26 DIAGNOSIS — I10 Essential (primary) hypertension: Secondary | ICD-10-CM | POA: Diagnosis not present

## 2020-08-26 DIAGNOSIS — K5000 Crohn's disease of small intestine without complications: Secondary | ICD-10-CM | POA: Diagnosis present

## 2020-08-26 DIAGNOSIS — F419 Anxiety disorder, unspecified: Secondary | ICD-10-CM | POA: Diagnosis present

## 2020-08-26 DIAGNOSIS — G9341 Metabolic encephalopathy: Secondary | ICD-10-CM | POA: Diagnosis present

## 2020-08-26 DIAGNOSIS — R627 Adult failure to thrive: Secondary | ICD-10-CM | POA: Diagnosis not present

## 2020-08-26 DIAGNOSIS — G4733 Obstructive sleep apnea (adult) (pediatric): Secondary | ICD-10-CM | POA: Diagnosis present

## 2020-08-26 DIAGNOSIS — E785 Hyperlipidemia, unspecified: Secondary | ICD-10-CM | POA: Diagnosis present

## 2020-08-26 DIAGNOSIS — R001 Bradycardia, unspecified: Secondary | ICD-10-CM | POA: Diagnosis present

## 2020-08-26 DIAGNOSIS — Z951 Presence of aortocoronary bypass graft: Secondary | ICD-10-CM

## 2020-08-26 DIAGNOSIS — I255 Ischemic cardiomyopathy: Secondary | ICD-10-CM | POA: Diagnosis present

## 2020-08-26 DIAGNOSIS — I714 Abdominal aortic aneurysm, without rupture: Secondary | ICD-10-CM | POA: Diagnosis present

## 2020-08-26 DIAGNOSIS — Z833 Family history of diabetes mellitus: Secondary | ICD-10-CM

## 2020-08-26 DIAGNOSIS — D649 Anemia, unspecified: Secondary | ICD-10-CM | POA: Diagnosis present

## 2020-08-26 DIAGNOSIS — R188 Other ascites: Secondary | ICD-10-CM | POA: Diagnosis present

## 2020-08-26 DIAGNOSIS — I5042 Chronic combined systolic (congestive) and diastolic (congestive) heart failure: Secondary | ICD-10-CM

## 2020-08-26 DIAGNOSIS — N183 Chronic kidney disease, stage 3 unspecified: Secondary | ICD-10-CM

## 2020-08-26 DIAGNOSIS — K219 Gastro-esophageal reflux disease without esophagitis: Secondary | ICD-10-CM | POA: Diagnosis present

## 2020-08-26 DIAGNOSIS — Z85828 Personal history of other malignant neoplasm of skin: Secondary | ICD-10-CM

## 2020-08-26 DIAGNOSIS — D696 Thrombocytopenia, unspecified: Secondary | ICD-10-CM | POA: Diagnosis present

## 2020-08-26 DIAGNOSIS — I700 Atherosclerosis of aorta: Secondary | ICD-10-CM | POA: Diagnosis not present

## 2020-08-26 LAB — CBC
Hematocrit: 27.5 % — ABNORMAL LOW (ref 37.5–51.0)
Hemoglobin: 9.3 g/dL — ABNORMAL LOW (ref 13.0–17.7)
MCH: 30.6 pg (ref 26.6–33.0)
MCHC: 33.8 g/dL (ref 31.5–35.7)
MCV: 91 fL (ref 79–97)
RBC: 3.04 x10E6/uL — ABNORMAL LOW (ref 4.14–5.80)
RDW: 14.2 % (ref 11.6–15.4)
WBC: 6.3 10*3/uL (ref 3.4–10.8)

## 2020-08-26 LAB — BASIC METABOLIC PANEL
BUN/Creatinine Ratio: 9 — ABNORMAL LOW (ref 10–24)
BUN: 42 mg/dL — ABNORMAL HIGH (ref 8–27)
CO2: 19 mmol/L — ABNORMAL LOW (ref 20–29)
Calcium: 8.2 mg/dL — ABNORMAL LOW (ref 8.6–10.2)
Chloride: 109 mmol/L — ABNORMAL HIGH (ref 96–106)
Creatinine, Ser: 4.6 mg/dL — ABNORMAL HIGH (ref 0.76–1.27)
Glucose: 100 mg/dL — ABNORMAL HIGH (ref 65–99)
Potassium: 4.3 mmol/L (ref 3.5–5.2)
Sodium: 144 mmol/L (ref 134–144)
eGFR: 12 mL/min/{1.73_m2} — ABNORMAL LOW (ref >59)

## 2020-08-26 LAB — CBC WITH DIFFERENTIAL/PLATELET
Abs Immature Granulocytes: 0.02 10*3/uL (ref 0.00–0.07)
Basophils Absolute: 0 10*3/uL (ref 0.0–0.1)
Basophils Relative: 1 %
Eosinophils Absolute: 0.1 10*3/uL (ref 0.0–0.5)
Eosinophils Relative: 2 %
HCT: 28.7 % — ABNORMAL LOW (ref 39.0–52.0)
Hemoglobin: 9.3 g/dL — ABNORMAL LOW (ref 13.0–17.0)
Immature Granulocytes: 0 %
Lymphocytes Relative: 51 %
Lymphs Abs: 2.4 10*3/uL (ref 0.7–4.0)
MCH: 30.9 pg (ref 26.0–34.0)
MCHC: 32.4 g/dL (ref 30.0–36.0)
MCV: 95.3 fL (ref 80.0–100.0)
Monocytes Absolute: 0.5 10*3/uL (ref 0.1–1.0)
Monocytes Relative: 10 %
Neutro Abs: 1.7 10*3/uL (ref 1.7–7.7)
Neutrophils Relative %: 36 %
Platelets: 39 10*3/uL — ABNORMAL LOW (ref 150–400)
RBC: 3.01 MIL/uL — ABNORMAL LOW (ref 4.22–5.81)
RDW: 14.9 % (ref 11.5–15.5)
WBC: 4.7 10*3/uL (ref 4.0–10.5)
nRBC: 0 % (ref 0.0–0.2)

## 2020-08-26 LAB — PROTIME-INR
INR: 5 (ref 0.8–1.2)
Prothrombin Time: 46.2 seconds — ABNORMAL HIGH (ref 11.4–15.2)

## 2020-08-26 LAB — COMPREHENSIVE METABOLIC PANEL
ALT: 14 U/L (ref 0–44)
AST: 25 U/L (ref 15–41)
Albumin: 2.9 g/dL — ABNORMAL LOW (ref 3.5–5.0)
Alkaline Phosphatase: 42 U/L (ref 38–126)
Anion gap: 8 (ref 5–15)
BUN: 49 mg/dL — ABNORMAL HIGH (ref 8–23)
CO2: 23 mmol/L (ref 22–32)
Calcium: 9 mg/dL (ref 8.9–10.3)
Chloride: 109 mmol/L (ref 98–111)
Creatinine, Ser: 4.69 mg/dL — ABNORMAL HIGH (ref 0.61–1.24)
GFR, Estimated: 11 mL/min — ABNORMAL LOW (ref >60)
Glucose, Bld: 84 mg/dL (ref 70–99)
Potassium: 4.6 mmol/L (ref 3.5–5.1)
Sodium: 140 mmol/L (ref 135–145)
Total Bilirubin: 1.4 mg/dL — ABNORMAL HIGH (ref 0.3–1.2)
Total Protein: 4.7 g/dL — ABNORMAL LOW (ref 6.5–8.1)

## 2020-08-26 LAB — LACTIC ACID, PLASMA: Lactic Acid, Venous: 1.2 mmol/L (ref 0.5–1.9)

## 2020-08-26 NOTE — ED Triage Notes (Signed)
Pt was sent by Dr. Caryl Comes d/t labs showing kidney failure.

## 2020-08-26 NOTE — ED Notes (Signed)
Bladder Scan >238

## 2020-08-26 NOTE — ED Provider Notes (Signed)
Emergency Medicine Provider Triage Evaluation Note  [name] , a 87 y.o. male  was evaluated in triage.  Pt complains of evaluation for kidney failure.  Patient was sent by his doctor, per chart review patient has creatinine of 4, baseline around 2.  Unsure when he last urinated, wife said he at least urinated once today.  Denies any fevers, has been taking torsemide for CHF.  Review of Systems  Positive: Kidney failure Negative:   Physical Exam  BP (!) 88/55 (BP Location: Left Arm)   Pulse (!) 59   Temp 97.7 F (36.5 C) (Oral)   Resp 18   SpO2 98%  Gen:   Awake, appears slightly sleepy, blood pressure 88/55 Resp:  Normal effort   Other:  Slow to respond  Medical Decision Making  Medically screening exam initiated at 7:17 PM.  Appropriate orders placed.  [name] was informed that the remainder of the evaluation will be completed by another provider, this initial triage assessment does not replace that evaluation, and the importance of remaining in the ED until their evaluation is complete.  Patient is hypotensive, will need to be moved back as soon as possible, most likely exacerbating kidney failure.  Wife states that he has been hypotensive all day yesterday as well.   Alfredia Client, PA-C 08/26/20 1919    Lorelle Gibbs, DO 08/26/20 2300

## 2020-08-26 NOTE — ED Provider Notes (Signed)
[facility] DEPT Provider Note   CSN: [id number] Arrival date & time: 08/26/20  1851     History Chief Complaint  Patient presents with   Abnormal Lab    Showing renal failure    NAYEF COLLEGE is a 87 y.o. male.  HPI  87 year old male past medical history of HTN, HLD, AAA, dementia, thrombosis on anticoagulation, CAD status post CABG/stent with bradycardia, AICD in place, CHF presents the emergency department with concern for renal failure.  Patient has baseline CKD, however had outpatient blood work done which showed a creatinine that reportedly doubled.  The wife is the primary history giver as the patient has baseline dementia and confusion.  She states over the past couple days he has had a significant decrease in appetite, has not been drinking many fluids and is only been urinating maybe once a day.  He has not been admitting to any other complaints.  No documented fever, vomiting or diarrhea.  Past Medical History:  Diagnosis Date   AAA (abdominal aortic aneurysm) ([place])    Abdominal pain 04/14/2011   Acute bronchitis 09/02/2013   Acute vascular insufficiency of intestine (HCC)    Mesenteric embolus...surgery 2003   Allergic rhinitis 09/09/2013   Anxiety    Aortic atherosclerosis ([place]) 11/10/2015   Atrial fibrillation (HCC)    paroxysmal   Back pain 08/08/2016   Biventricular implantable cardiac defibrillator in situ    GDT CONTAK H170-MADIT-CRT-EXPLANTED 2011 implanted defibrillator- Guidant cognis, model n119-2001 Dr. Caryl Comes (11/28/2009)   Breast tenderness    Spironolactone was stopped and eplerinone started. Patient feels better   CAD (coronary artery disease)    CABG 1994  /   catheterization 1996, occluded vein graft to the diagonal, other grafts patent  /   catheterization 2006, no PCI  /  nuclear, October, 2011, extensive scar anteroseptal and apical, no ischemia   Carotid artery disease ([place])    Doppler, February,  2012, 0-39% bilateral,  recommended followup 1 year   Chronic systolic heart failure ([place])    chronic systolic    Chronotropic incompetence    Cirrhosis, nonalcoholic ([place]) 6/96/2952   Colonic polyp 2010   pathology not clear.    Crohn's disease ([place])    Degenerative joint disease    Dermatitis 04/05/2016   Dizziness    Positional dizziness   Edema leg    Venous Dopplers 8/13: No DVT bilaterally   Ejection fraction    EF 35%, echo, November, 2011  //   Is 35-40%, echo,  September 23, 2011    Fatigue 06/25/2008   Qualifier: Diagnosis of  By: [name], MD, [facility], Dorinda Hill    GERD 02/21/2007   Qualifier: Diagnosis of  By: Ronnald Ramp CNA/MA, Jessica     Hyperglycemia 08/08/2016   Hyperlipemia    Hypertension    Hypertrophy of prostate with urinary obstruction and other lower urinary tract symptoms (LUTS)    Hypoalbuminemia 06/16/2013   Hypokalemia 06/18/2013   Implantable cardioverter-defibrillator (ICD) in situ 12/09/2009   Annotation: EXPLANTED 2011   //   New Guidant ICD implanted 2011      Iron deficiency anemia    Ischemic cardiomyopathy    CABG 1994 CAD catherization 1996.[name] occluded vein graft to the diagnol, other grafts patent/  catherization 2006, no PCI/ nuclear.[name] october 2011.[name] extensive scar anteroseptal and apical.. 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 november 2009/ ef 35%.[name] echo november 2011   Oral thrush 02/05/2015   OSA (obstructive sleep apnea) 09/16/2011   NPSG 2013:  AHI 34/hr.     Polymyalgia rheumatica (HCC)    Shingles    Sinus bradycardia    Skin cancer 08/08/2016   Thrombocytopenia ([place]) 2008   Thrush    Tremor    Ventral hernia    multiple, wears an abd binder, reports he hs been told he was at increased riisk for surgery   Vitamin B12 deficiency    Vitamin D deficiency 11/06/2008   Qualifier: Diagnosis of  By: Lenna Gilford MD, Deborra Medina     Warfarin anticoagulation    Coumadin therapy for mesenteric embolus 2003    Patient Active Problem List   Diagnosis Date Noted   Viral illness 02/16/2020   AKI (acute kidney injury) ([place]) 02/06/2020   Skin lesion of left arm 08/26/2019   Hematuria 08/26/2019   Dementia ([place]) 12/16/2018   Paroxysmal atrial fibrillation ([place]) 04/02/2018   Encounter for therapeutic drug monitoring 03/08/2017   Cognitive impairment 02/27/2017   Preventative health care 10/17/2016   Skin cancer 08/08/2016   Hyperglycemia 08/08/2016   Back pain 08/08/2016   Dermatitis 04/05/2016   Aortic atherosclerosis ([place]) 11/10/2015   Acute on chronic combined systolic and diastolic CHF (congestive heart failure) ([place]) 02/06/2015   Cirrhosis, nonalcoholic ([place]) 54/65/6812   Allergic rhinitis 09/09/2013   Hypokalemia 06/18/2013   Hypoalbuminemia 06/16/2013   Left ankle pain 06/07/2013   Thrombocytopenia ([place]) 11/30/2011   Edema leg    OSA (obstructive sleep apnea) 09/16/2011   Abdominal pain 04/14/2011   Hx of CABG    Hypertension    Chronic systolic heart failure (HCC)    Mitral regurgitation    Chronotropic incompetence    Hyperlipemia    Crohn's disease of large intestine (HCC)    Ventral hernia    Tremor    Warfarin anticoagulation    Carotid artery disease ([place])    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   Implantable cardioverter-defibrillator (ICD) in situ 12/09/2009   HYPERTROPHY PROSTATE W/UR OBST & OTH LUTS 03/31/2009   Vitamin B 12 deficiency 11/06/2008   Vitamin D deficiency 11/06/2008   Cardiomyopathy, ischemic 09/15/2008   LBBB (left bundle branch block) 06/25/2008   Fatigue 06/25/2008   COLONIC POLYPS 03/23/2007   POLYMYALGIA RHEUMATICA 03/23/2007   GERD 02/21/2007    Past Surgical History:  Procedure Laterality Date   BIV ICD GENERATOR CHANGEOUT N/A 10/23/2017   Procedure: BIV ICD GENERATOR CHANGEOUT;  Surgeon: Deboraha Sprang, MD;  Location: [facility];  Service:  Cardiovascular;  Laterality: N/A;   CATARACT EXTRACTION, BILATERAL  2014   COLONOSCOPY N/A 04/19/2013   Procedure: COLONOSCOPY;  Surgeon: Jerene Bears, MD;  Location: [facility] ENDOSCOPY;  Service: Endoscopy;  Laterality: N/A;   CORONARY ARTERY BYPASS GRAFT  1994   Elap w/ superior mesenteric artery embolectomy  1/03   by Dr. Jennette Banker   ESOPHAGOGASTRODUODENOSCOPY N/A 04/18/2013   Procedure: ESOPHAGOGASTRODUODENOSCOPY (EGD);  Surgeon: Jerene Bears, MD;  Location: [facility] ENDOSCOPY;  Service: Endoscopy;  Laterality: N/A;   implantation of biventric cardioverter-defibrillatorr     by Dr. Lovena Le in 8/06 Guidant Contak H170-explanted 2011   implantation of guidant cognis device     model n119-2011   laparoscopic cholecystectomy  2002   left inguinal hernia repair with mesh  6/08   by Dr. Betsy Pries   PROSTATE  SURGERY  08/2012   by urology in [place]   Repair of incarcerated ventral hernia and lysis of adhesions  11/03   by Dr. Betsy Pries       Family History  Problem Relation Age of Onset   Heart disease Mother    Tremor Sister    Heart failure Sister    Heart disease Father 80   Hypertension Father    Appendicitis Brother        ruptured   Diabetes Son    Heart disease Sister    Hypertension Sister    Dementia Sister    Heart disease Sister    Hypertension Brother    Kidney disease Brother    Myasthenia gravis Brother    Cirrhosis Brother    Leukemia Brother    Hypertension Other    Hyperlipidemia Other    Heart disease Sister    Edema Sister        pedal edema    Social History   Tobacco Use   Smoking status: Former    Packs/day: 1.00    Years: 20.00    Pack years: 20.00    Types: Cigarettes    Quit date: 01/31/1973    Years since quitting: 47.6   Smokeless tobacco: Never  Vaping Use   Vaping Use: Never used  Substance Use Topics   Alcohol use: Yes    Alcohol/week: 0.0 standard drinks    Comment: occasional   Drug use: No    Home Medications Prior to Admission medications    Medication Sig Start Date End Date Taking? Authorizing Provider  acetaminophen (TYLENOL) 650 MG CR tablet Take 650 mg by mouth daily as needed for pain.    [provider]  albuterol (VENTOLIN HFA) 108 (90 Base) MCG/ACT inhaler Inhale 2 puffs into the lungs every 4 (four) hours as needed for wheezing or shortness of breath. 02/14/20   [name], MD  Ascorbic Acid (VITAMIN C) 100 MG tablet Take 100 mg by mouth daily.    [provider]  carvedilol (COREG) 6.25 MG tablet TAKE 1 TABLET BY MOUTH TWICE DAILY WITH A MEAL 07/06/20   [name], MD  cholecalciferol (VITAMIN D) 1000 UNITS tablet Take 1,000 Units by mouth every evening.    [provider]  cholestyramine (QUESTRAN) 4 g packet Take 1 packet (4 g total) by mouth daily. 09/04/18   Pyrtle, Lajuan Lines, MD  donepezil (ARICEPT) 10 MG tablet Take 1 tablet (10 mg total) by mouth at bedtime. 02/17/20   [name], MD  fluticasone (FLOVENT HFA) 220 MCG/ACT inhaler Inhale 1 puff into the lungs in the morning and at bedtime. 02/14/20   [name], MD  hydrocortisone cream 1 % Apply 1 application topically daily as needed for itching.    [provider]  levocetirizine (XYZAL) 5 MG tablet TAKE 1 TABLET BY MOUTH ONCE DAILY IN THE EVENING 08/18/20   [name], MD  lisinopril (ZESTRIL) 2.5 MG tablet Take 1 tablet (2.5 mg total) by mouth daily. 08/24/20   Deboraha Sprang, MD  Loperamide HCl (LOPERAMIDE A-D PO) Take 1 tablet by mouth 2 (two) times daily as needed (Diarrhea).    [provider]  magnesium gluconate (MAGONATE) 500 MG tablet Take 500 mg by mouth as needed (cramps).    [provider]  memantine (NAMENDA) 10 MG tablet Take 1 tablet by mouth twice daily 05/13/20   [name], MD  Menthol, Topical Analgesic, (BIOFREEZE EX)  Apply 1 application topically daily as needed (pain).    [provider]  pantoprazole (PROTONIX) 40 MG tablet TAKE 1 TABLET BY MOUTH ONCE DAILY AT  NOON 04/08/20   Shelda Pal, DO  potassium chloride SA (KLOR-CON) 20 MEQ tablet Take 1 tablet (20 mEq total) by mouth daily. 04/30/20   Dorothy Spark, MD  Simethicone (GAS-X MAXIMUM STRENGTH PO) Take 1-2 tablets by mouth daily as needed (Flatulence).    [provider]  simvastatin (ZOCOR) 20 MG tablet TAKE 1 TABLET BY MOUTH AT BEDTIME 06/03/20   [name], MD  torsemide (DEMADEX) 20 MG tablet Alternate between 80m (2 -tablets) and 813m(4 tablets) daily 08/24/20   KlDeboraha SprangMD  vitamin B-12 (CYANOCOBALAMIN) 1000 MCG tablet Take 500 mcg by mouth daily.     [provider]  warfarin (COUMADIN) 5 MG tablet TAKE 1/2-1 TABLET DAILY  AS DIRECTED BY THE COUMADIN CLINIC 08/18/20   CoSherren MochaMD    Allergies    Patient has no known allergies.  Review of Systems   Review of Systems  Unable to perform ROS: Dementia   Physical Exam Updated Vital Signs BP (!) 98/56 (BP Location: Left Arm)   Pulse 60   Temp 97.7 F (36.5 C) (Oral)   Resp 16   Ht 5' 10"  (1.778 m)   Wt 75.8 kg   SpO2 97%   BMI 23.96 kg/m   Physical Exam Vitals and nursing note reviewed.  Constitutional:      General: He is not in acute distress.    Appearance: Normal appearance.  HENT:     Head: Normocephalic.     Mouth/Throat:     Mouth: Mucous membranes are moist.  Cardiovascular:     Rate and Rhythm: Normal rate.  Pulmonary:     Effort: Pulmonary effort is normal. No respiratory distress.  Abdominal:     Palpations: Abdomen is soft.     Tenderness: There is no abdominal tenderness.     Comments: Midline surgical scar with soft/reducible hernias  Musculoskeletal:        General: Swelling present. No deformity.     Comments: Pitting edema of the bilateral lower extremities  Skin:    General: Skin is warm.  Neurological:     Mental Status: He is alert. Mental status is at baseline.  Psychiatric:        Mood and Affect: Mood normal.    ED Results / Procedures /  Treatments   Labs (all labs ordered are listed, but only abnormal results are displayed) Labs Reviewed  COMPREHENSIVE METABOLIC PANEL  CBC WITH DIFFERENTIAL/PLATELET  URINALYSIS, ROUTINE W REFLEX MICROSCOPIC  LACTIC ACID, PLASMA  LACTIC ACID, PLASMA  PROTIME-INR    EKG None  Radiology No results found.  Procedures Procedures   Medications Ordered in ED Medications - No data to display  ED Course  I have reviewed the triage vital signs and the nursing notes.  Pertinent labs & imaging results that were available during my care of the patient were reviewed by me and considered in my medical decision making (see chart for details).    MDM Rules/Calculators/A&P                           [age]ear old male presents emergency department concern for new renal failure found on outpatient labs.  Patient was hypotensive on arrival.  Wife admits to decreased p.o. intake.  Blood work here  confirms renal failure with a supratherapeutic INR at 5.  No findings of active bleeding.  Hypotensive and was fluid responsive.  Patient has peripheral edema.  Patient has not given a urine sample yet, bladder scan shows 200 cc of urine in the bladder.  We are pending UA to rule out UTI.  CT of the abdomen pelvis shows ascites/anasarca with atrophic kidneys but no other findings of pyelonephritis/obstructive uropathy.  Patient will be admitted for renal failure.  He has held his Coumadin dose yesterday and today, INR will be repeated.  Patients evaluation and results requires admission for further treatment and care. Patient agrees with admission plan, offers no new complaints and is stable/unchanged at time of admit.  Final Clinical Impression(s) / ED Diagnoses Final diagnoses:  None    Rx / DC Orders ED Discharge Orders     None        Lorelle Gibbs, DO 08/27/20 0007

## 2020-08-26 NOTE — Telephone Encounter (Signed)
Spoke with pt's wife and advised per Dr Caryl Comes pt's lab show kidney failure and pt should go to hospital now.  Pt's wife verbalizes understanding and will take pt to [facility].

## 2020-08-27 ENCOUNTER — Encounter (HOSPITAL_COMMUNITY): Payer: Self-pay | Admitting: Family Medicine

## 2020-08-27 ENCOUNTER — Inpatient Hospital Stay (HOSPITAL_COMMUNITY): Payer: Medicare HMO

## 2020-08-27 DIAGNOSIS — Z951 Presence of aortocoronary bypass graft: Secondary | ICD-10-CM

## 2020-08-27 DIAGNOSIS — I480 Paroxysmal atrial fibrillation: Secondary | ICD-10-CM | POA: Diagnosis not present

## 2020-08-27 DIAGNOSIS — G9341 Metabolic encephalopathy: Secondary | ICD-10-CM | POA: Diagnosis not present

## 2020-08-27 DIAGNOSIS — K5010 Crohn's disease of large intestine without complications: Secondary | ICD-10-CM | POA: Diagnosis not present

## 2020-08-27 DIAGNOSIS — Z66 Do not resuscitate: Secondary | ICD-10-CM | POA: Diagnosis not present

## 2020-08-27 DIAGNOSIS — I130 Hypertensive heart and chronic kidney disease with heart failure and stage 1 through stage 4 chronic kidney disease, or unspecified chronic kidney disease: Secondary | ICD-10-CM | POA: Diagnosis not present

## 2020-08-27 DIAGNOSIS — G4733 Obstructive sleep apnea (adult) (pediatric): Secondary | ICD-10-CM | POA: Diagnosis not present

## 2020-08-27 DIAGNOSIS — I5042 Chronic combined systolic (congestive) and diastolic (congestive) heart failure: Secondary | ICD-10-CM

## 2020-08-27 DIAGNOSIS — I9589 Other hypotension: Secondary | ICD-10-CM | POA: Diagnosis not present

## 2020-08-27 DIAGNOSIS — K5000 Crohn's disease of small intestine without complications: Secondary | ICD-10-CM | POA: Diagnosis not present

## 2020-08-27 DIAGNOSIS — F419 Anxiety disorder, unspecified: Secondary | ICD-10-CM | POA: Diagnosis not present

## 2020-08-27 DIAGNOSIS — R791 Abnormal coagulation profile: Secondary | ICD-10-CM | POA: Diagnosis not present

## 2020-08-27 DIAGNOSIS — K7469 Other cirrhosis of liver: Secondary | ICD-10-CM

## 2020-08-27 DIAGNOSIS — N179 Acute kidney failure, unspecified: Secondary | ICD-10-CM | POA: Diagnosis not present

## 2020-08-27 DIAGNOSIS — K7290 Hepatic failure, unspecified without coma: Secondary | ICD-10-CM | POA: Diagnosis not present

## 2020-08-27 DIAGNOSIS — D696 Thrombocytopenia, unspecified: Secondary | ICD-10-CM

## 2020-08-27 DIAGNOSIS — E785 Hyperlipidemia, unspecified: Secondary | ICD-10-CM | POA: Diagnosis not present

## 2020-08-27 DIAGNOSIS — I5022 Chronic systolic (congestive) heart failure: Secondary | ICD-10-CM | POA: Diagnosis not present

## 2020-08-27 DIAGNOSIS — N1832 Chronic kidney disease, stage 3b: Secondary | ICD-10-CM | POA: Diagnosis not present

## 2020-08-27 DIAGNOSIS — K55069 Acute infarction of intestine, part and extent unspecified: Secondary | ICD-10-CM | POA: Diagnosis not present

## 2020-08-27 DIAGNOSIS — I5023 Acute on chronic systolic (congestive) heart failure: Secondary | ICD-10-CM | POA: Diagnosis not present

## 2020-08-27 DIAGNOSIS — K7460 Unspecified cirrhosis of liver: Secondary | ICD-10-CM

## 2020-08-27 DIAGNOSIS — W57XXXA Bitten or stung by nonvenomous insect and other nonvenomous arthropods, initial encounter: Secondary | ICD-10-CM | POA: Diagnosis not present

## 2020-08-27 DIAGNOSIS — R627 Adult failure to thrive: Secondary | ICD-10-CM | POA: Diagnosis not present

## 2020-08-27 DIAGNOSIS — K7200 Acute and subacute hepatic failure without coma: Secondary | ICD-10-CM

## 2020-08-27 DIAGNOSIS — K7210 Chronic hepatic failure without coma: Secondary | ICD-10-CM | POA: Diagnosis not present

## 2020-08-27 DIAGNOSIS — Z95810 Presence of automatic (implantable) cardiac defibrillator: Secondary | ICD-10-CM

## 2020-08-27 DIAGNOSIS — R188 Other ascites: Secondary | ICD-10-CM | POA: Diagnosis not present

## 2020-08-27 DIAGNOSIS — I714 Abdominal aortic aneurysm, without rupture: Secondary | ICD-10-CM | POA: Diagnosis not present

## 2020-08-27 DIAGNOSIS — E861 Hypovolemia: Secondary | ICD-10-CM

## 2020-08-27 DIAGNOSIS — N170 Acute kidney failure with tubular necrosis: Secondary | ICD-10-CM | POA: Diagnosis not present

## 2020-08-27 DIAGNOSIS — Z20822 Contact with and (suspected) exposure to covid-19: Secondary | ICD-10-CM | POA: Diagnosis not present

## 2020-08-27 DIAGNOSIS — N183 Chronic kidney disease, stage 3 unspecified: Secondary | ICD-10-CM | POA: Diagnosis not present

## 2020-08-27 DIAGNOSIS — I959 Hypotension, unspecified: Secondary | ICD-10-CM | POA: Diagnosis not present

## 2020-08-27 DIAGNOSIS — I7143 Infrarenal abdominal aortic aneurysm, without rupture: Secondary | ICD-10-CM | POA: Insufficient documentation

## 2020-08-27 DIAGNOSIS — N184 Chronic kidney disease, stage 4 (severe): Secondary | ICD-10-CM | POA: Diagnosis not present

## 2020-08-27 DIAGNOSIS — F0390 Unspecified dementia without behavioral disturbance: Secondary | ICD-10-CM | POA: Diagnosis not present

## 2020-08-27 DIAGNOSIS — D649 Anemia, unspecified: Secondary | ICD-10-CM | POA: Diagnosis not present

## 2020-08-27 DIAGNOSIS — I2510 Atherosclerotic heart disease of native coronary artery without angina pectoris: Secondary | ICD-10-CM | POA: Diagnosis not present

## 2020-08-27 LAB — LACTIC ACID, PLASMA: Lactic Acid, Venous: 1 mmol/L (ref 0.5–1.9)

## 2020-08-27 LAB — URINALYSIS, ROUTINE W REFLEX MICROSCOPIC
Bilirubin Urine: NEGATIVE
Glucose, UA: NEGATIVE mg/dL
Hgb urine dipstick: NEGATIVE
Ketones, ur: NEGATIVE mg/dL
Nitrite: NEGATIVE
Protein, ur: NEGATIVE mg/dL
Specific Gravity, Urine: 1.006 (ref 1.005–1.030)
pH: 5 (ref 5.0–8.0)

## 2020-08-27 LAB — RESP PANEL BY RT-PCR (FLU A&B, COVID) ARPGX2
Influenza A by PCR: NEGATIVE
Influenza B by PCR: NEGATIVE
SARS Coronavirus 2 by RT PCR: NEGATIVE

## 2020-08-27 LAB — BASIC METABOLIC PANEL
Anion gap: 3 — ABNORMAL LOW (ref 5–15)
BUN: 44 mg/dL — ABNORMAL HIGH (ref 8–23)
CO2: 22 mmol/L (ref 22–32)
Calcium: 7.3 mg/dL — ABNORMAL LOW (ref 8.9–10.3)
Chloride: 118 mmol/L — ABNORMAL HIGH (ref 98–111)
Creatinine, Ser: 3.76 mg/dL — ABNORMAL HIGH (ref 0.61–1.24)
GFR, Estimated: 15 mL/min — ABNORMAL LOW (ref >60)
Glucose, Bld: 69 mg/dL — ABNORMAL LOW (ref 70–99)
Potassium: 3.7 mmol/L (ref 3.5–5.1)
Sodium: 143 mmol/L (ref 135–145)

## 2020-08-27 LAB — CBG MONITORING, ED: Glucose-Capillary: 72 mg/dL (ref 70–99)

## 2020-08-27 LAB — SODIUM, URINE, RANDOM: Sodium, Ur: 39 mmol/L

## 2020-08-27 LAB — PROTIME-INR
INR: 3.8 — ABNORMAL HIGH (ref 0.8–1.2)
Prothrombin Time: 37.5 seconds — ABNORMAL HIGH (ref 11.4–15.2)

## 2020-08-27 LAB — CREATININE, URINE, RANDOM: Creatinine, Urine: 59.45 mg/dL

## 2020-08-27 LAB — AMMONIA: Ammonia: 60 umol/L — ABNORMAL HIGH (ref 9–35)

## 2020-08-27 MED ORDER — ALBUTEROL SULFATE (2.5 MG/3ML) 0.083% IN NEBU: 3.0000 mL | INHALATION_SOLUTION | RESPIRATORY_TRACT | Status: DC | PRN

## 2020-08-27 MED ORDER — DONEPEZIL HCL 10 MG PO TABS
10.0000 mg | ORAL_TABLET | Freq: Every day | ORAL | Status: DC
  Administered 2020-08-27 – 2020-08-29 (×3): 10 mg via ORAL
  Filled 2020-08-27 (×3): qty 1

## 2020-08-27 MED ORDER — VITAMIN C 100 MG PO TABS: 100.0000 mg | ORAL_TABLET | Freq: Every day | ORAL | Status: DC

## 2020-08-27 MED ORDER — VITAMIN B-12 1000 MCG PO TABS
500.0000 ug | ORAL_TABLET | Freq: Every day | ORAL | Status: DC
  Administered 2020-08-27 – 2020-08-30 (×4): 500 ug via ORAL
  Filled 2020-08-27 (×4): qty 1

## 2020-08-27 MED ORDER — MEMANTINE HCL 10 MG PO TABS
10.0000 mg | ORAL_TABLET | Freq: Two times a day (BID) | ORAL | Status: DC
  Administered 2020-08-27 – 2020-08-30 (×6): 10 mg via ORAL
  Filled 2020-08-27 (×6): qty 1

## 2020-08-27 MED ORDER — ENSURE ENLIVE PO LIQD
237.0000 mL | Freq: Two times a day (BID) | ORAL | Status: DC
  Administered 2020-08-27 – 2020-08-30 (×6): 237 mL via ORAL

## 2020-08-27 MED ORDER — ONDANSETRON HCL 4 MG/2ML IJ SOLN: 4.0000 mg | Freq: Four times a day (QID) | INTRAMUSCULAR | Status: DC | PRN

## 2020-08-27 MED ORDER — ACETAMINOPHEN 325 MG PO TABS: 650.0000 mg | ORAL_TABLET | Freq: Four times a day (QID) | ORAL | Status: DC | PRN

## 2020-08-27 MED ORDER — MIDODRINE HCL 5 MG PO TABS
10.0000 mg | ORAL_TABLET | Freq: Three times a day (TID) | ORAL | Status: DC
  Administered 2020-08-27 – 2020-08-30 (×8): 10 mg via ORAL
  Filled 2020-08-27 (×8): qty 2

## 2020-08-27 NOTE — H&P (Addendum)
History and Physical    [name] OFB:[id number] DOB: January 24, 1932 DOA: 08/26/2020  PCP: [name], MD  Patient coming from:Home  I have personally briefly reviewed patient's old medical records in [place]  Chief Complaint: AKI  HPI: [name] is a 87 y.o. 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  Wife at bedside provides history as patient is voluntarily nonverbal.  Wife reports that patient has been feeling unwell for the past 3 weeks since he has been talking less and mostly just sitting at home.  She also reports significant bilateral lower extremity edema for the past 3 weeks as well as worsening appetite.  He has not been able to eat well but continues to drink plenty of fluids.   Patient then saw electrophysiology on 7/25 and had lab work. Told to presented to ED due to worsening renal function. During that visit he was also told to increase his torsemide.Per documentation it appears he has been having issues with lower extremity edema and worsening renal insufficiency, he had lisinopril decreased and was told to increase torsemide to 80 mg for 3 days and then alternate between 80 mg and 40 mg every other day.  However, wife reports he was on 40 mg daily and she has been giving him up to 60 mg daily.  She is unsure whether he is urinating more less since he uses the bathroom on his own.  States he has diarrhea but that could be due to Crohn's flareup from time to time.  She is not sure if this is any worse.  Denies him having any nausea or vomiting.  No complaints of pain that she knows of.  Also noted to have supratherapeutic INR 2 days ago and took last dose of Coumadin on  7/25.   ED Course: He was afebrile, hypotensive down to systolic of 78E with improvement up to 100 over 60s without intervention.  He was bradycardic with heart rate of 59.  Hemoglobin stable at 9.3.  Platelet of 39 which is chronic.  Creatinine has doubled to 4.69 from a prior of 1.8 without anion gap.  Bladder scan with less than 200 cc retention. INR 5.  CT of the abdomen showing significant volume ascites/anasarca/body wall edema and edematous small bowel and colon.   Review of Systems: Unable to obtain due to patient dementia  Past Medical History:  Diagnosis Date   AAA (abdominal aortic aneurysm) ([place])    Abdominal pain 04/14/2011   Acute bronchitis 09/02/2013   Acute vascular insufficiency of intestine (HCC)    Mesenteric embolus...surgery 2003   Allergic rhinitis 09/09/2013   Anxiety    Aortic atherosclerosis ([place]) 11/10/2015   Atrial fibrillation (HCC)    paroxysmal   Back pain 08/08/2016   Biventricular implantable cardiac defibrillator in situ    GDT CONTAK H170-MADIT-CRT-EXPLANTED 2011 implanted defibrillator- Guidant cognis, model n119-2001 Dr. Caryl Comes (11/28/2009)   Breast tenderness    Spironolactone was stopped and eplerinone started. Patient feels better   CAD (coronary artery disease)    CABG 1994  /   catheterization 1996, occluded vein graft to the diagonal, other grafts patent  /   catheterization 2006, no PCI  /  nuclear, October, 2011, extensive scar anteroseptal and apical, no ischemia   Carotid  artery disease ([place])    Doppler, February,  2012, 0-39% bilateral, recommended followup 1 year   Chronic systolic heart failure ([place])    chronic systolic    Chronotropic incompetence    Cirrhosis, nonalcoholic ([place]) 04/24/4980   Colonic polyp 2010   pathology not clear.    Crohn's disease ([place])    Degenerative joint disease    Dermatitis 04/05/2016   Dizziness    Positional dizziness   Edema leg    Venous Dopplers 8/13: No DVT bilaterally   Ejection fraction    EF  35%, echo, November, 2011  //   Is 35-40%, echo,  September 23, 2011    Fatigue 06/25/2008   Qualifier: Diagnosis of  By: [name], MD, [facility], Dorinda Hill    GERD 02/21/2007   Qualifier: Diagnosis of  By: Ronnald Ramp CNA/MA, Jessica     Hyperglycemia 08/08/2016   Hyperlipemia    Hypertension    Hypertrophy of prostate with urinary obstruction and other lower urinary tract symptoms (LUTS)    Hypoalbuminemia 06/16/2013   Hypokalemia 06/18/2013   Implantable cardioverter-defibrillator (ICD) in situ 12/09/2009   Annotation: EXPLANTED 2011   //   New Guidant ICD implanted 2011      Iron deficiency anemia    Ischemic cardiomyopathy    CABG 1994 CAD catherization 1996.[name] occluded vein graft to the diagnol, other grafts patent/  catherization 2006, no PCI/ nuclear.[name] october 2011.[name] extensive scar anteroseptal and apical.. 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 november 2009/ ef 35%.[name] echo november 2011   Oral thrush 02/05/2015   OSA (obstructive sleep apnea) 09/16/2011   NPSG 2013:  AHI 34/hr.     Polymyalgia rheumatica (HCC)    Shingles    Sinus bradycardia    Skin cancer 08/08/2016   Thrombocytopenia ([place]) 2008   Thrush    Tremor    Ventral hernia    multiple, wears an abd binder, reports he hs been told he was at increased riisk for surgery   Vitamin B12 deficiency    Vitamin D deficiency 11/06/2008   Qualifier: Diagnosis of  By: Lenna Gilford MD, Deborra Medina    Warfarin anticoagulation    Coumadin therapy for mesenteric embolus 2003    Past Surgical History:  Procedure Laterality Date   BIV ICD GENERATOR CHANGEOUT N/A 10/23/2017   Procedure: BIV ICD GENERATOR CHANGEOUT;  Surgeon: Deboraha Sprang, MD;  Location: [facility];  Service: Cardiovascular;  Laterality: N/A;   CATARACT EXTRACTION, BILATERAL  2014   COLONOSCOPY N/A 04/19/2013   Procedure:  COLONOSCOPY;  Surgeon: Jerene Bears, MD;  Location[facility] ENDOSCOPY;  Service: Endoscopy;  Laterality: N/A;   CORONARY ARTERY BYPASS GRAFT  1994   Elap w/ superior mesenteric artery embolectomy  1/03   by Dr. Jennette Banker   ESOPHAGOGASTRODUODENOSCOPY N/A 04/18/2013   Procedure: ESOPHAGOGASTRODUODENOSCOPY (EGD);  Surgeon: Jerene Bears, MD;  Location: [facility] ENDOSCOPY;  Service: Endoscopy;  Laterality: N/A;   implantation of biventric cardioverter-defibrillatorr     by Dr. Lovena Le in 8/06 Guidant Contak H170-explanted 2011   implantation of guidant cognis device     model n119-2011   laparoscopic cholecystectomy  2002   left inguinal hernia repair with mesh  6/08   by Dr. Betsy Pries   PROSTATE SURGERY  08/2012   by urology in [place]  Repair of incarcerated ventral hernia and lysis of adhesions  11/03   by Dr. Betsy Pries     reports that he quit smoking about 47 years ago. His smoking use included cigarettes. He has a 20.00 pack-year smoking history. He has never used smokeless tobacco. He reports current alcohol use. He reports that he does not use drugs. Social History  No Known Allergies  Family History  Problem Relation Age of Onset   Heart disease Mother    Tremor Sister    Heart failure Sister    Heart disease Father 41   Hypertension Father    Appendicitis Brother        ruptured   Diabetes Son    Heart disease Sister    Hypertension Sister    Dementia Sister    Heart disease Sister    Hypertension Brother    Kidney disease Brother    Myasthenia gravis Brother    Cirrhosis Brother    Leukemia Brother    Hypertension Other    Hyperlipidemia Other    Heart disease Sister    Edema Sister        pedal edema     Prior to Admission medications   Medication Sig Start Date End Date Taking? Authorizing Provider  acetaminophen (TYLENOL) 650 MG CR tablet Take 650 mg by mouth daily as needed for pain.    [provider]  albuterol (VENTOLIN HFA) 108 (90 Base) MCG/ACT inhaler  Inhale 2 puffs into the lungs every 4 (four) hours as needed for wheezing or shortness of breath. 02/14/20   [name], MD  Ascorbic Acid (VITAMIN C) 100 MG tablet Take 100 mg by mouth daily.    [provider]  carvedilol (COREG) 6.25 MG tablet TAKE 1 TABLET BY MOUTH TWICE DAILY WITH A MEAL 07/06/20   [name], MD  cholecalciferol (VITAMIN D) 1000 UNITS tablet Take 1,000 Units by mouth every evening.    [provider]  cholestyramine (QUESTRAN) 4 g packet Take 1 packet (4 g total) by mouth daily. 09/04/18   Pyrtle, Lajuan Lines, MD  donepezil (ARICEPT) 10 MG tablet Take 1 tablet (10 mg total) by mouth at bedtime. 02/17/20   [name], MD  fluticasone (FLOVENT HFA) 220 MCG/ACT inhaler Inhale 1 puff into the lungs in the morning and at bedtime. 02/14/20   [name], MD  hydrocortisone cream 1 % Apply 1 application topically daily as needed for itching.    [provider]  levocetirizine (XYZAL) 5 MG tablet TAKE 1 TABLET BY MOUTH ONCE DAILY IN THE EVENING 08/18/20   [name], MD  lisinopril (ZESTRIL) 2.5 MG tablet Take 1 tablet (2.5 mg total) by mouth daily. 08/24/20   Deboraha Sprang, MD  Loperamide HCl (LOPERAMIDE A-D PO) Take 1 tablet by mouth 2 (two) times daily as needed (Diarrhea).    [provider]  magnesium gluconate (MAGONATE) 500 MG tablet Take 500 mg by mouth as needed (cramps).    [provider]  memantine (NAMENDA) 10 MG tablet Take 1 tablet by mouth twice daily 05/13/20   [name], MD  Menthol, Topical Analgesic, (BIOFREEZE EX) Apply 1 application topically daily as needed (pain).    [provider]  pantoprazole (PROTONIX) 40 MG tablet TAKE 1 TABLET BY MOUTH ONCE DAILY AT NOON 04/08/20   Shelda Pal, DO  potassium chloride SA (KLOR-CON) 20 MEQ tablet Take 1 tablet (20 mEq total) by mouth daily. 04/30/20  Dorothy Spark, MD  Simethicone (GAS-X MAXIMUM STRENGTH PO) Take 1-2 tablets by mouth daily  as needed (Flatulence).    [provider]  simvastatin (ZOCOR) 20 MG tablet TAKE 1 TABLET BY MOUTH AT BEDTIME 06/03/20   [name], MD  torsemide (DEMADEX) 20 MG tablet Alternate between 12m (2 -tablets) and 816m(4 tablets) daily 08/24/20   KlDeboraha SprangMD  vitamin B-12 (CYANOCOBALAMIN) 1000 MCG tablet Take 500 mcg by mouth daily.     [provider]  warfarin (COUMADIN) 5 MG tablet TAKE 1/2-1 TABLET DAILY  AS DIRECTED BY THE COUMADIN CLINIC 08/18/20   CoSherren MochaMD    Physical Exam: Vitals:   08/26/20 2030 08/26/20 2200 08/26/20 2230 08/26/20 2330  BP: (!) 100/57 (!) 100/57 100/60 101/62  Pulse: 60 (!) 59 (!) 59 63  Resp: 16 (!) 26 15 14   Temp:      TempSrc:      SpO2: 98% 95% 96% 98%  Weight:      Height:        Constitutional: NAD, calm, comfortable, nontoxic appearing elderly male sitting upright in bed Vitals:   08/26/20 2030 08/26/20 2200 08/26/20 2230 08/26/20 2330  BP: (!) 100/57 (!) 100/57 100/60 101/62  Pulse: 60 (!) 59 (!) 59 63  Resp: 16 (!) 26 15 14   Temp:      TempSrc:      SpO2: 98% 95% 96% 98%  Weight:      Height:       Eyes: PERRL, lids and conjunctivae normal ENMT: Mucous membranes are moist. Posterior pharynx clear of any exudate or lesions.Normal dentition.  Neck: normal, supple, no masses, no thyromegaly Respiratory: clear to auscultation bilaterally, no wheezing, no crackles. Normal respiratory effort. No accessory muscle use.  Cardiovascular: Regular rate and rhythm, no murmurs / rubs / gallops.  Significant +4 pitting edema bilateral lower extremity up to knees  abdomen: no tenderness with multiple easily reducible ventral hernias, no large volume ascites noted Musculoskeletal: no clubbing / cyanosis. No joint deformity upper and lower extremities. Normal muscle tone.  Skin: no rashes, lesions, ulcers. No induration Neurologic: Alert but not answering questions or following commands  psychiatric: pt alert but  selectively non-verbal    Labs on Admission: I have personally reviewed following labs and imaging studies  CBC: Recent Labs  Lab 08/25/20 1331 08/26/20 2011  WBC 6.3 4.7  NEUTROABS  --  1.7  HGB 9.3* 9.3*  HCT 27.5* 28.7*  MCV 91 95.3  PLT Comment 39*   Basic Metabolic Panel: Recent Labs  Lab 08/25/20 1327 08/26/20 2011  NA 144 140  K 4.3 4.6  CL 109* 109  CO2 19* 23  GLUCOSE 100* 84  BUN 42* 49*  CREATININE 4.60* 4.69*  CALCIUM 8.2* 9.0   GFR: Estimated Creatinine Clearance: 11.5 mL/min (A) (by C-G formula based on SCr of 4.69 mg/dL (H)). Liver Function Tests: Recent Labs  Lab 08/26/20 2011  AST 25  ALT 14  ALKPHOS 42  BILITOT 1.4*  PROT 4.7*  ALBUMIN 2.9*   No results for input(s): LIPASE, AMYLASE in the last 168 hours. No results for input(s): AMMONIA in the last 168 hours. Coagulation Profile: Recent Labs  Lab 08/25/20 1213 08/26/20 2011  INR 4.2* 5.0*   Cardiac Enzymes: No results for input(s): CKTOTAL, CKMB, CKMBINDEX, TROPONINI in the last 168 hours. BNP (last 3 results) Recent Labs    01/22/20 1430 05/06/20 1302  PROBNP 1,003* 975*   HbA1C:  No results for input(s): HGBA1C in the last 72 hours. CBG: No results for input(s): GLUCAP in the last 168 hours. Lipid Profile: No results for input(s): CHOL, HDL, LDLCALC, TRIG, CHOLHDL, LDLDIRECT in the last 72 hours. Thyroid Function Tests: No results for input(s): TSH, T4TOTAL, FREET4, T3FREE, THYROIDAB in the last 72 hours. Anemia Panel: No results for input(s): VITAMINB12, FOLATE, FERRITIN, TIBC, IRON, RETICCTPCT in the last 72 hours. Urine analysis:    Component Value Date/Time   COLORURINE YELLOW 08/18/2019 0541   APPEARANCEUR Cloudy (A) 09/11/2019 1055   LABSPEC 1.009 08/18/2019 0541   PHURINE 5.0 08/18/2019 0541   GLUCOSEU Negative 09/11/2019 1055   GLUCOSEU NEGATIVE 11/10/2015 1220   HGBUR LARGE (A) 08/18/2019 0541   BILIRUBINUR Negative 09/11/2019 1055   KETONESUR NEGATIVE  08/18/2019 0541   PROTEINUR Negative 09/11/2019 1055   PROTEINUR NEGATIVE 08/18/2019 0541   UROBILINOGEN 1.0 11/10/2015 1220   NITRITE Negative 09/11/2019 1055   NITRITE NEGATIVE 08/18/2019 0541   LEUKOCYTESUR Trace (A) 09/11/2019 1055   LEUKOCYTESUR NEGATIVE 08/18/2019 0541    Radiological Exams on Admission: CT Abdomen Pelvis Wo Contrast  Addendum Date: 08/26/2020   ADDENDUM REPORT: 08/26/2020 23:20 ADDENDUM: Additional impression point: Multiple bowel containing ventral hernias in a configuration similar to prior without evidence of high-grade obstruction at this time. Electronically Signed   By: Lovena Le M.D.   On: 08/26/2020 23:20   Result Date: 08/26/2020 CLINICAL DATA:  Acute renal failure EXAM: CT ABDOMEN AND PELVIS WITHOUT CONTRAST TECHNIQUE: Multidetector CT imaging of the abdomen and pelvis was performed following the standard protocol without IV contrast. COMPARISON:  CT 08/18/2019 FINDINGS: Lower chest: Trace bilateral effusions. Subpleural reticular changes are similar to comparison prior and may reflect some chronic interstitial change. Cardiomegaly. Hypoattenuation of the cardiac blood pool compatible with anemia. Myocardial calcification at the cardiac apex may reflect sequela of prior ischemia/infarct. Three-vessel coronary artery atherosclerosis. AICD with leads at the right atrium, coronary sinus and cardiac apex. No pericardial effusion. Hepatobiliary: Coarse, heterogeneous appearance of the liver with markedly nodular surface contour and scattered parenchymal calcifications. No focal renal lesion is seen. Prior cholecystectomy. No discernible biliary ductal dilatation. Pancreas: Pancreatic atrophy. No focal peripancreatic inflammation or ductal dilatation. Vascular calcium along the splenic artery. Spleen: Splenomegaly. Normal in size. No concerning splenic lesions. Adrenals/Urinary Tract: Lobular thickening of the adrenal glands is similar to priors. Stable appearance of a  probable cyst in the upper pole right kidney (2/31). Multiple bilateral renal sinus calcifications, possible vascular calcium versus nonobstructive nephrolithiasis. No hydronephrosis or obstructing urolithiasis. Urinary bladder is unremarkable for the degree of distention. Keyhole defect towards the level the prostate may reflect prior trans urethral resection. Stomach/Bowel: Circumferential thickening of the distal thoracic esophagus and possible thick edematous changes of the underdistended stomach. Duodenum and small bowel appears mildly edematous diffusely without conspicuous distension. Several loops of small bowel protrude into multiple small ventral hernias without evidence of frank obstruction or other concerning acute abnormality of the herniated bowel loops. Diffuse edematous mural thickening throughout the colonic segments, most pronounced of the cecum and transverse colon. No evidence of bowel obstruction. Appendix is seen in the right lower quadrant. No focal periappendiceal inflammation to suggest acute appendicitis. Vascular/Lymphatic: Extensive atherosclerotic calcification throughout the abdominal aorta and branch vessels. Focal ectasia and probable eccentric mural thrombus seen of the infrarenal abdominal aorta with a maximal diameter of up to 3.3 cm, not significantly changed when measured at similar level on comparison prior. Extensive upper abdominal venous collateralization. Incompletely  assessed in the absence of contrast media. Few prominent gastrohepatic nodes and porta hepatis lymph nodes are seen. Additional edematous nodes seen in the mid mesentery and retroperitoneum. Reproductive: Coarse calcifications of the prostate appear largely eccentric. Keyhole defect suggesting prior trans urethral resection. Other: Diffuse circumferential body wall edema increasing edematous changes in the mesentery with moderate volume ascites. No free air. No organized abscess or collection. Multiple small  broad-based ventral hernia defects with protruding loops of small bowel. No evidence of resulting obstruction at these levels. Additional fat containing right inguinal hernia. Musculoskeletal: The osseous structures appear diffusely demineralized which may limit detection of small or nondisplaced fractures. Scattered benign-appearing sclerotic bone islands most pronounced in the right ilium. Mild levocurvature lumbar spine. Multilevel degenerative changes are present in the imaged portions of the spine. Stable appearance of a tiny intramuscular probable lipoma of the right iliopsoas near the right ilium. IMPRESSION: 1. Stigmata of cirrhosis including a diminutive, nodular and heterogeneous liver with splenomegaly, upper abdominal venous collateralization and moderate volume ascites and diffuse features of anasarca including circumferential body wall edema and bilateral pleural effusions. Nonspecific gastrohepatic and porta hepatis adenopathy, can be seen in the setting of portal hypertension and cirrhosis though should continue attention on subsequent imaging. 2. Edematous appearance of much of the small bowel and colon, particularly involving the proximal colonic segments, are in a pattern very typical for portal enteropathy/colopathy. Should correlate with clinical findings to exclude a superimposed enterocolitis. 3. Mildly atrophic appearance of the kidneys. No hydronephrosis or obstructive urolithiasis. Scattered nonobstructing nephroliths versus vascular calcium. 4. Cardiomegaly. Calcification of the myometrium at the cardiac apex can reflect sequela of prior infarct. Coronary artery atherosclerosis. AICD. 5.  Aortic Atherosclerosis (ICD10-I70.0). 6. Stable infrarenal abdominal aortic aneurysm measuring up to 3.3 cm with lamellated calcification. Recommend follow-up ultrasound every 3 years. This recommendation follows ACR consensus guidelines: White Paper of the ACR Incidental Findings Committee II on Vascular  Findings. J Am Coll Radiol 2013; 10:789-794. Electronically Signed: By: Lovena Le M.D. On: 08/26/2020 23:10      Assessment/Plan  Acute on chronic decompensated nonalcoholic cirrhosis -Patient with significant bilateral lower extremity edema and diffuse anasarca noted on CT abdomen -unfortunately unable to aggressively diuresis due to hypotension and worsening renal function. Will need to monitor blood pressure and follow improvement in renal function before resuming diuretics  Hypotension -hold antihypertensives, diuretics and beta-blocker for now - could consider albumin or midodrine if renal function and hypotension not improved with holding medicaitons  Acute renal failure -likely due to increase diuretic and possible hepatorenal syndrome.  -avoid nephrotoxic agents  -consider IV albumin if no improvement with holding diuetics -obtain urine FeUREA study  -no post void retention noted on bladder scan  Supratherapeutic INR with history of mesenteric thrombus secondary to worsening renal function -INR of 5 with goal of 2-3 -continue to hold Coumadin and monitor INR daily  History of combined diastolic and systolic CHF/ischemic cardiomyopathy s/p CABG -stable from cardiac standpoint  Hx of bradycardia with ICD -stable   Thrombocytopenia - Stable and chronic. platelet of 39 with no signs of bleeding.  Infrarenal AAA -incidential finding on CT- 3.3 cm -3 year ultrasound follow up recommended   DVT prophylaxis:.Lovenox Code Status: Full Family Communication: Plan discussed with wife at bedside  disposition Plan: Home with inpatient Consults called:  Admission status: Inpatient with at least 2 midnights  Level of care: Progressive  Status is: Inpatient  Remains inpatient appropriate because:Inpatient level of care appropriate due to severity  of illness  Dispo: The patient is from: Home              Anticipated d/c is to: Home              Patient currently is not  medically stable to d/c.   Difficult to place patient No         Orene Desanctis DO [facility]   If 7PM-7AM, please contact night-coverage [email or web address]   08/27/2020, 12:32 AM

## 2020-08-27 NOTE — Consult Note (Addendum)
Referring Provider:  [facility]         Primary Care Physician:  [name], MD Primary Gastroenterologist: Zenovia Jarred, MD We were asked to see this patient for:   decompensated cirrhosis.                Attending physician's note   I have taken a history, examined the patient and reviewed the chart. I agree with the Advanced Practitioner's note, impression and recommendations.  89 yr very pleasant gentleman with h/o Crohn's disease, CHF and cryptogenic cirrhosis admitted with significantly elevated Cr  MELD score 35 (elevated primarily secondary to significant elevation in INR and Cr)  He has anasarca with mild abdominal distension and peripheral edema Multiple surgical incision scars and ventral hernia  Unclear etiology for acute worsening of renal function No evidence of active GI bleeding  Will need to exclude SBP  Plan for diagnostic paracentesis, check fluid cytology, albumin, protein and cell count IV albumin Avoid diuretics INR supratherapeutic on Coumadin, hold  Consider repeat echo to exclude worsening cardiac function precipitating renal failure  Turn in bed and out of bed as tolerated  GI will continue to follow along    The patient was provided an opportunity to ask questions and all were answered. The patient agreed with the plan and demonstrated an understanding of the instructions.  Damaris Hippo , MD [phone]    ASSESSMENT / PLAN   # Cryptogenic cirrhosis ( likely NASH). He is admitted after outpatient labs showed AKI.  Not sure how much he is really decompensated from a liver standpoint. No jaundice. INR high but in setting of warfarin. He has ascites but is overall volume overload. He doesn't seem encephalopathic ( tells me he is at the hospital, aware it is July, he can do basic math, no asterixis on exam). No evidence for GI bleed. However he does have AKI which could be hepatorenal though diuretics recently increased and he hasn't  been taking much PO. MELD and Child Pugh will be high due INR / AKI. --Creatine did improve overnight. Diuretics on hold.  --On renal diet with fluid restriction.  --Avoid hepatotoxic medications.  --Ammonia level already ordered. Following that it is not unreasonable to give lactulose.  --Consider diagnostic paracentesis when INR comes down.  --At some point needs an EGD for varices screening, especially since he requires anticoagulation. Last EGD was in 2005 -  no varices --HCC screening. CT scan mentions no focal "renal" lesions under hepatobiliary  but I suspect it was meant to read no focal liver lesions. Check AFP.    # AKI, creatinine improving. AKI probably secondary to decreased PO intake and recent diuretic dose increase.   # History of Crohn's of small bowel, s/p resection. Possibly right sided colon involvement. Mild edema of right colon on last colonoscopy ( done for bleeding) in 2015 but in setting of low serum albumin. Biopsies not taken due to recent bleeding. Patient lost to follow up , last seen in February 2020. No on any meds for IBD. No evidence for active IBD on noncontrast CTAP yesterday. At baseline he has one loose stool daily   # Supratherapeutic INR of 5  --Coumadin on hold.   # Thickening of distal esophagus on CT scan  --Can evaluate further at time of varices screening EGD. Could be esophagitis, maybe varices, neoplasm not excluded.   # Chronic anemia, Hgb stable at 9.3.   HISTORY OF PRESENT ILLNESS  Chief Complaint:  none from patient  [name] is a 87 y.o. 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 Hilarie Fredrickson for cirrhosis ( probably NASH)  and Crohn's enteritis s/p resection with possible right colon involvement. Last office visit was in February 2020. He didn't show for follow up appt and we were unable to reach him.   Patient presented to ED yesterday after labs at [place] office  showed AKI with doubling of creatinine.   Per wife who is caregiver, patient has had little PO intake over the last few days. He has had some recent increases / changes in diuretics due to edema.   In ED his creatinine was 4.6, improved today to 3.7. Tbili 1.4, LAE normal. Noncon CTAP shows cardiomegaly, cirrhosis, atrophic pancreas, splenomegaly, thickening of adrenal glands, circumferential thickening of distal esophagus and possible edematous changes of underdistended stomach.Several loops of small bowel protruding into multiple small ventral hernias without frank obstruction. Moderate ascites   Patient has a supratherapeutic INR of 5.   PREVIOUS ENDOSCOPIC EVALUATIONS   March 2015 colonoscopy for GI bleed Fair prep Ascending colon edema ( low albumin) A few small polyps ( 5 mm or less) , not removed due to recent bleeding.    Past Medical History:  Diagnosis Date   AAA (abdominal aortic aneurysm) ([place])    Abdominal pain 04/14/2011   Acute bronchitis 09/02/2013   Acute vascular insufficiency of intestine (HCC)    Mesenteric embolus...surgery 2003   Allergic rhinitis 09/09/2013   Anxiety    Aortic atherosclerosis ([place]) 11/10/2015   Atrial fibrillation (HCC)    paroxysmal   Back pain 08/08/2016   Biventricular implantable cardiac defibrillator in situ    GDT CONTAK H170-MADIT-CRT-EXPLANTED 2011 implanted defibrillator- Guidant cognis, model n119-2001 Dr. Caryl Comes (11/28/2009)   Breast tenderness    Spironolactone was stopped and eplerinone started. Patient feels better   CAD (coronary artery disease)    CABG 1994  /    catheterization 1996, occluded vein graft to the diagonal, other grafts patent  /   catheterization 2006, no PCI  /  nuclear, October, 2011, extensive scar anteroseptal and apical, no ischemia   Carotid artery disease ([place])    Doppler, February,  2012, 0-39% bilateral, recommended followup 1 year   Chronic systolic heart failure ([place])    chronic systolic    Chronotropic incompetence    Cirrhosis, nonalcoholic ([place]) 0/96/0454   Colonic polyp 2010   pathology not clear.    Crohn's disease ([place])    Degenerative joint disease    Dermatitis 04/05/2016   Dizziness    Positional dizziness   Edema leg    Venous Dopplers 8/13: No DVT bilaterally   Ejection fraction    EF 35%, echo, November, 2011  //   Is 35-40%, echo,  September 23, 2011    Fatigue 06/25/2008   Qualifier: Diagnosis of  By: [name], MD, [facility], Dorinda Hill    GERD 02/21/2007   Qualifier: Diagnosis of  By: Ronnald Ramp CNA/MA, Jessica     Hyperglycemia 08/08/2016   Hyperlipemia    Hypertension    Hypertrophy of prostate with urinary obstruction and other lower urinary tract symptoms (LUTS)    Hypoalbuminemia 06/16/2013   Hypokalemia 06/18/2013  Implantable cardioverter-defibrillator (ICD) in situ 12/09/2009   Annotation: EXPLANTED 2011   //   New Guidant ICD implanted 2011      Iron deficiency anemia    Ischemic cardiomyopathy    CABG 1994 CAD catherization 1996.[name] occluded vein graft to the diagnol, other grafts patent/  catherization 2006, no PCI/ nuclear.[name] october 2011.[name] extensive scar anteroseptal and apical.. 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 november 2009/ ef 35%.[name] echo november 2011   Oral thrush 02/05/2015   OSA (obstructive sleep apnea) 09/16/2011   NPSG 2013:  AHI 34/hr.     Polymyalgia rheumatica (HCC)    Shingles    Sinus bradycardia    Skin cancer 08/08/2016    Thrombocytopenia ([place]) 2008   Thrush    Tremor    Ventral hernia    multiple, wears an abd binder, reports he hs been told he was at increased riisk for surgery   Vitamin B12 deficiency    Vitamin D deficiency 11/06/2008   Qualifier: Diagnosis of  By: Lenna Gilford MD, Deborra Medina    Warfarin anticoagulation    Coumadin therapy for mesenteric embolus 2003    Past Surgical History:  Procedure Laterality Date   BIV ICD GENERATOR CHANGEOUT N/A 10/23/2017   Procedure: BIV ICD GENERATOR CHANGEOUT;  Surgeon: Deboraha Sprang, MD;  Location: [place] [facility];  Service: Cardiovascular;  Laterality: N/A;   CATARACT EXTRACTION, BILATERAL  2014   COLONOSCOPY N/A 04/19/2013   Procedure: COLONOSCOPY;  Surgeon: Jerene Bears, MD;  Location[facility] ENDOSCOPY;  Service: Endoscopy;  Laterality: N/A;   CORONARY ARTERY BYPASS GRAFT  1994   Elap w/ superior mesenteric artery embolectomy  1/03   by Dr. Jennette Banker   ESOPHAGOGASTRODUODENOSCOPY N/A 04/18/2013   Procedure: ESOPHAGOGASTRODUODENOSCOPY (EGD);  Surgeon: Jerene Bears, MD;  Location: [facility] ENDOSCOPY;  Service: Endoscopy;  Laterality: N/A;   implantation of biventric cardioverter-defibrillatorr     by Dr. Lovena Le in 8/06 Guidant Contak H170-explanted 2011   implantation of guidant cognis device     model n119-2011   laparoscopic cholecystectomy  2002   left inguinal hernia repair with mesh  6/08   by Dr. Betsy Pries   PROSTATE SURGERY  08/2012   by urology in [place]   Repair of incarcerated ventral hernia and lysis of adhesions  11/03   by Dr. Betsy Pries    Prior to Admission medications   Medication Sig Start Date End Date Taking? Authorizing Provider  acetaminophen (TYLENOL) 650 MG CR tablet Take 650 mg by mouth daily as needed for pain.    [provider]  albuterol (VENTOLIN HFA) 108 (90 Base) MCG/ACT inhaler Inhale 2 puffs into the lungs every 4 (four) hours as needed for wheezing or shortness of breath. 02/14/20   [name], MD  Ascorbic Acid (VITAMIN  C) 100 MG tablet Take 100 mg by mouth daily.    [provider]  carvedilol (COREG) 6.25 MG tablet TAKE 1 TABLET BY MOUTH TWICE DAILY WITH A MEAL 07/06/20   [name], MD  cholecalciferol (VITAMIN D) 1000 UNITS tablet Take 1,000 Units by mouth every evening.    [provider]  cholestyramine (QUESTRAN) 4 g packet Take 1 packet (4 g total) by mouth daily. 09/04/18   Pyrtle, Lajuan Lines, MD  donepezil (ARICEPT) 10 MG tablet  Take 1 tablet (10 mg total) by mouth at bedtime. 02/17/20   [name], MD  fluticasone (FLOVENT HFA) 220 MCG/ACT inhaler Inhale 1 puff into the lungs in the morning and at bedtime. 02/14/20   [name], MD  hydrocortisone cream 1 % Apply 1 application topically daily as needed for itching.    [provider]  levocetirizine (XYZAL) 5 MG tablet TAKE 1 TABLET BY MOUTH ONCE DAILY IN THE EVENING 08/18/20   [name], MD  lisinopril (ZESTRIL) 2.5 MG tablet Take 1 tablet (2.5 mg total) by mouth daily. 08/24/20   Deboraha Sprang, MD  Loperamide HCl (LOPERAMIDE A-D PO) Take 1 tablet by mouth 2 (two) times daily as needed (Diarrhea).    [provider]  magnesium gluconate (MAGONATE) 500 MG tablet Take 500 mg by mouth as needed (cramps).    [provider]  memantine (NAMENDA) 10 MG tablet Take 1 tablet by mouth twice daily 05/13/20   [name], MD  Menthol, Topical Analgesic, (BIOFREEZE EX) Apply 1 application topically daily as needed (pain).    [provider]  pantoprazole (PROTONIX) 40 MG tablet TAKE 1 TABLET BY MOUTH ONCE DAILY AT NOON 04/08/20   Shelda Pal, DO  potassium chloride SA (KLOR-CON) 20 MEQ tablet Take 1 tablet (20 mEq total) by mouth daily. 04/30/20   Dorothy Spark, MD  Simethicone (GAS-X MAXIMUM STRENGTH PO) Take 1-2 tablets by mouth daily as needed (Flatulence).    [provider]  simvastatin (ZOCOR) 20 MG tablet TAKE 1 TABLET BY MOUTH AT BEDTIME 06/03/20   [name], MD   torsemide (DEMADEX) 20 MG tablet Alternate between 30m (2 -tablets) and 891m(4 tablets) daily 08/24/20   KlDeboraha SprangMD  vitamin B-12 (CYANOCOBALAMIN) 1000 MCG tablet Take 500 mcg by mouth daily.     [provider]  warfarin (COUMADIN) 5 MG tablet TAKE 1/2-1 TABLET DAILY  AS DIRECTED BY THE COUMADIN CLINIC 08/18/20   CoSherren MochaMD    No current facility-administered medications for this encounter.   Current Outpatient Medications  Medication Sig Dispense Refill   acetaminophen (TYLENOL) 650 MG CR tablet Take 650 mg by mouth daily as needed for pain.     albuterol (VENTOLIN HFA) 108 (90 Base) MCG/ACT inhaler Inhale 2 puffs into the lungs every 4 (four) hours as needed for wheezing or shortness of breath. 18 g 1   Ascorbic Acid (VITAMIN C) 100 MG tablet Take 100 mg by mouth daily.     carvedilol (COREG) 6.25 MG tablet TAKE 1 TABLET BY MOUTH TWICE DAILY WITH A MEAL 180 tablet 0   cholecalciferol (VITAMIN D) 1000 UNITS tablet Take 1,000 Units by mouth every evening.     cholestyramine (QUESTRAN) 4 g packet Take 1 packet (4 g total) by mouth daily. 30 each 3   donepezil (ARICEPT) 10 MG tablet Take 1 tablet (10 mg total) by mouth at bedtime. 90 tablet 3   fluticasone (FLOVENT HFA) 220 MCG/ACT inhaler Inhale 1 puff into the lungs in the morning and at bedtime. 1 each 1   hydrocortisone cream 1 % Apply 1 application topically daily as needed for itching.     levocetirizine (XYZAL) 5 MG tablet TAKE 1 TABLET BY MOUTH ONCE DAILY IN THE EVENING 30 tablet 0   lisinopril (ZESTRIL) 2.5 MG tablet Take 1 tablet (2.5 mg total) by mouth daily. 90 tablet 3   Loperamide HCl (LOPERAMIDE A-D PO) Take 1 tablet  by mouth 2 (two) times daily as needed (Diarrhea).     magnesium gluconate (MAGONATE) 500 MG tablet Take 500 mg by mouth as needed (cramps).     memantine (NAMENDA) 10 MG tablet Take 1 tablet by mouth twice daily 60 tablet 5   Menthol, Topical Analgesic, (BIOFREEZE EX) Apply 1 application  topically daily as needed (pain).     pantoprazole (PROTONIX) 40 MG tablet TAKE 1 TABLET BY MOUTH ONCE DAILY AT NOON 90 tablet 3   potassium chloride SA (KLOR-CON) 20 MEQ tablet Take 1 tablet (20 mEq total) by mouth daily. 90 tablet 2   Simethicone (GAS-X MAXIMUM STRENGTH PO) Take 1-2 tablets by mouth daily as needed (Flatulence).     simvastatin (ZOCOR) 20 MG tablet TAKE 1 TABLET BY MOUTH AT BEDTIME 90 tablet 0   torsemide (DEMADEX) 20 MG tablet Alternate between 28m (2 -tablets) and 84m(4 tablets) daily 270 tablet 1   vitamin B-12 (CYANOCOBALAMIN) 1000 MCG tablet Take 500 mcg by mouth daily.      warfarin (COUMADIN) 5 MG tablet TAKE 1/2-1 TABLET DAILY  AS DIRECTED BY THE COUMADIN CLINIC 90 tablet 0    Allergies as of 08/26/2020   (No Known Allergies)    Family History  Problem Relation Age of Onset   Heart disease Mother    Tremor Sister    Heart failure Sister    Heart disease Father [id number] Hypertension Father    Appendicitis Brother        ruptured   Diabetes Son    Heart disease Sister    Hypertension Sister    Dementia Sister    Heart disease Sister    Hypertension Brother    Kidney disease Brother    Myasthenia gravis Brother    Cirrhosis Brother    Leukemia Brother    Hypertension Other    Hyperlipidemia Other    Heart disease Sister    Edema Sister        pedal edema    Social History   Socioeconomic History   Marital status: Married    Spouse name: JaKennyth Lose Number of children: 3   Years of education: Not on file   Highest education level: Not on file  Occupational History   Occupation: bull doActuary Occupation: farmer    Employer: RETIRED   Occupation: logger  Tobacco Use   Smoking status: Former    Packs/day: 1.00    Years: 20.00    Pack years: 20.00    Types: Cigarettes    Quit date: 01/31/1973    Years since quitting: 47.6   Smokeless tobacco: Never  Vaping Use   Vaping Use: Never used  Substance and Sexual Activity   Alcohol use: Yes     Alcohol/week: 0.0 standard drinks    Comment: occasional   Drug use: No   Sexual activity: Never    Comment: lives with wife, no dietary restrictions  Other Topics Concern   Not on file  Social History Narrative   [place]    Social Determinants of Health   Financial Resource Strain: Not on file  Food Insecurity: Not on file  Transportation Needs: Not on file  Physical Activity: Not on file  Stress: Not on file  Social Connections: Not on file  Intimate Partner Violence: Not on file    Review of Systems: All systems reviewed and negative except where noted in HPI.  OBJECTIVE   Physical Exam  Physical Exam: Vital signs in last 24 hours: Temp:  [97.4 F (36.3 C)-97.7 F (36.5 C)] 97.4 F (36.3 C) (07/28 1010) Pulse Rate:  [58-63] 58 (07/28 1000) Resp:  [12-26] 15 (07/28 1000) BP: (88-102)/(55-63) 98/62 (07/28 1000) SpO2:  [95 %-99 %] 96 % (07/28 1000) Weight:  [75.8 kg-76.7 kg] 75.8 kg (07/27 2019)   General:   Alert  male in NAD Psych:  Pleasant, cooperative. Normal mood and affect. Eyes:  Pupils equal, sclera clear, no icterus.   Conjunctiva pink. Ears:  Slightly HOH. Nose:  No deformity, discharge,  or lesions. Neck:  Supple; no masses Lungs:  Clear throughout to auscultation.   No wheezes, crackles, or rhonchi.  Heart:  Regular rate, pitting edema of BLE Abdomen:  Soft, non-distended, nontender, BS active, multiple abdominal wall hernias   Rectal:  Deferred  Msk:  Symmetrical without gross deformities. . Neurologic:  Alert and  oriented x4;  grossly normal neurologically. No asterixits Skin:  Intact without significant lesions or rashes.  Filed Weights   08/26/20 1957 08/26/20 2019  Weight: 76.7 kg 75.8 kg   Scheduled inpatient medications   Intake/Output from previous day: 07/27 0701 - 07/28 0700 In: -  Out: 500 [Urine:500] Intake/Output this shift: No intake/output data recorded.   Lab Results: Recent Labs     08/25/20 1331 08/26/20 2011  WBC 6.3 4.7  HGB 9.3* 9.3*  HCT 27.5* 28.7*  PLT Comment 39*   BMET Recent Labs    08/25/20 1327 08/26/20 2011 08/27/20 0539  NA 144 140 143  K 4.3 4.6 3.7  CL 109* 109 118*  CO2 19* 23 22  GLUCOSE 100* 84 69*  BUN 42* 49* 44*  CREATININE 4.60* 4.69* 3.76*  CALCIUM 8.2* 9.0 7.3*   LFT Recent Labs    08/26/20 2011  PROT 4.7*  ALBUMIN 2.9*  AST 25  ALT 14  ALKPHOS 42  BILITOT 1.4*   PT/INR Recent Labs    08/25/20 1213 08/26/20 2011  LABPROT  --  46.2*  INR 4.2* 5.0*   Hepatitis Panel No results for input(s): HEPBSAG, HCVAB, HEPAIGM, HEPBIGM in the last 72 hours.   . CBC Latest Ref Rng & Units 08/26/2020 08/25/2020 02/05/2020  WBC 4.0 - 10.5 K/uL 4.7 6.3 5.0  Hemoglobin 13.0 - 17.0 g/dL 9.3(L) 9.3(L) 9.0(L)  Hematocrit 39.0 - 52.0 % 28.7(L) 27.5(L) 27.0(L)  Platelets 150 - 400 K/uL 39(L) Comment 54(LL)    . CMP Latest Ref Rng & Units 08/27/2020 08/26/2020 08/25/2020  Glucose 70 - 99 mg/dL 69(L) 84 100(H)  BUN 8 - 23 mg/dL 44(H) 49(H) 42(H)  Creatinine 0.61 - 1.24 mg/dL 3.76(H) 4.69(H) 4.60(H)  Sodium 135 - 145 mmol/L 143 140 144  Potassium 3.5 - 5.1 mmol/L 3.7 4.6 4.3  Chloride 98 - 111 mmol/L 118(H) 109 109(H)  CO2 22 - 32 mmol/L 22 23 19(L)  Calcium 8.9 - 10.3 mg/dL 7.3(L) 9.0 8.2(L)  Total Protein 6.5 - 8.1 g/dL - 4.7(L) -  Total Bilirubin 0.3 - 1.2 mg/dL - 1.4(H) -  Alkaline Phos 38 - 126 U/L - 42 -  AST 15 - 41 U/L - 25 -  ALT 0 - 44 U/L - 14 -   Studies/Results: CT Abdomen Pelvis Wo Contrast  Addendum Date: 08/26/2020   ADDENDUM REPORT: 08/26/2020 23:20 ADDENDUM: Additional impression point: Multiple bowel containing ventral hernias in a configuration similar to prior without evidence of high-grade obstruction at this time. Electronically Signed   By: Elwin Sleight.D.  On: 08/26/2020 23:20   Result Date: 08/26/2020 CLINICAL DATA:  Acute renal failure EXAM: CT ABDOMEN AND PELVIS WITHOUT CONTRAST TECHNIQUE:  Multidetector CT imaging of the abdomen and pelvis was performed following the standard protocol without IV contrast. COMPARISON:  CT 08/18/2019 FINDINGS: Lower chest: Trace bilateral effusions. Subpleural reticular changes are similar to comparison prior and may reflect some chronic interstitial change. Cardiomegaly. Hypoattenuation of the cardiac blood pool compatible with anemia. Myocardial calcification at the cardiac apex may reflect sequela of prior ischemia/infarct. Three-vessel coronary artery atherosclerosis. AICD with leads at the right atrium, coronary sinus and cardiac apex. No pericardial effusion. Hepatobiliary: Coarse, heterogeneous appearance of the liver with markedly nodular surface contour and scattered parenchymal calcifications. No focal renal lesion is seen. Prior cholecystectomy. No discernible biliary ductal dilatation. Pancreas: Pancreatic atrophy. No focal peripancreatic inflammation or ductal dilatation. Vascular calcium along the splenic artery. Spleen: Splenomegaly. Normal in size. No concerning splenic lesions. Adrenals/Urinary Tract: Lobular thickening of the adrenal glands is similar to priors. Stable appearance of a probable cyst in the upper pole right kidney (2/31). Multiple bilateral renal sinus calcifications, possible vascular calcium versus nonobstructive nephrolithiasis. No hydronephrosis or obstructing urolithiasis. Urinary bladder is unremarkable for the degree of distention. Keyhole defect towards the level the prostate may reflect prior trans urethral resection. Stomach/Bowel: Circumferential thickening of the distal thoracic esophagus and possible thick edematous changes of the underdistended stomach. Duodenum and small bowel appears mildly edematous diffusely without conspicuous distension. Several loops of small bowel protrude into multiple small ventral hernias without evidence of frank obstruction or other concerning acute abnormality of the herniated bowel loops.  Diffuse edematous mural thickening throughout the colonic segments, most pronounced of the cecum and transverse colon. No evidence of bowel obstruction. Appendix is seen in the right lower quadrant. No focal periappendiceal inflammation to suggest acute appendicitis. Vascular/Lymphatic: Extensive atherosclerotic calcification throughout the abdominal aorta and branch vessels. Focal ectasia and probable eccentric mural thrombus seen of the infrarenal abdominal aorta with a maximal diameter of up to 3.3 cm, not significantly changed when measured at similar level on comparison prior. Extensive upper abdominal venous collateralization. Incompletely assessed in the absence of contrast media. Few prominent gastrohepatic nodes and porta hepatis lymph nodes are seen. Additional edematous nodes seen in the mid mesentery and retroperitoneum. Reproductive: Coarse calcifications of the prostate appear largely eccentric. Keyhole defect suggesting prior trans urethral resection. Other: Diffuse circumferential body wall edema increasing edematous changes in the mesentery with moderate volume ascites. No free air. No organized abscess or collection. Multiple small broad-based ventral hernia defects with protruding loops of small bowel. No evidence of resulting obstruction at these levels. Additional fat containing right inguinal hernia. Musculoskeletal: The osseous structures appear diffusely demineralized which may limit detection of small or nondisplaced fractures. Scattered benign-appearing sclerotic bone islands most pronounced in the right ilium. Mild levocurvature lumbar spine. Multilevel degenerative changes are present in the imaged portions of the spine. Stable appearance of a tiny intramuscular probable lipoma of the right iliopsoas near the right ilium. IMPRESSION: 1. Stigmata of cirrhosis including a diminutive, nodular and heterogeneous liver with splenomegaly, upper abdominal venous collateralization and moderate  volume ascites and diffuse features of anasarca including circumferential body wall edema and bilateral pleural effusions. Nonspecific gastrohepatic and porta hepatis adenopathy, can be seen in the setting of portal hypertension and cirrhosis though should continue attention on subsequent imaging. 2. Edematous appearance of much of the small bowel and colon, particularly involving the proximal colonic segments, are in a pattern  very typical for portal enteropathy/colopathy. Should correlate with clinical findings to exclude a superimposed enterocolitis. 3. Mildly atrophic appearance of the kidneys. No hydronephrosis or obstructive urolithiasis. Scattered nonobstructing nephroliths versus vascular calcium. 4. Cardiomegaly. Calcification of the myometrium at the cardiac apex can reflect sequela of prior infarct. Coronary artery atherosclerosis. AICD. 5.  Aortic Atherosclerosis (ICD10-I70.0). 6. Stable infrarenal abdominal aortic aneurysm measuring up to 3.3 cm with lamellated calcification. Recommend follow-up ultrasound every 3 years. This recommendation follows ACR consensus guidelines: White Paper of the ACR Incidental Findings Committee II on Vascular Findings. J Am Coll Radiol 2013; 10:789-794. Electronically Signed: By: Lovena Le M.D. On: 08/26/2020 23:10    Active Problems:   Implantable cardioverter-defibrillator (ICD) in situ   Mesenteric thrombosis (HCC)   Hx of CABG   Thrombocytopenia (HCC)   AKI (acute kidney injury) ([place])   Liver failure (HCC)   Hypotension   Supratherapeutic INR   Chronic combined systolic and diastolic CHF (congestive heart failure) ([place])   Aneurysm of infrarenal abdominal aorta ([place])    Tye Savoy, NP-C @  08/27/2020, 10:49 AM

## 2020-08-27 NOTE — Progress Notes (Addendum)
[facility]                                                                              Patient Demographics  [name], is a 87 y.o. male, DOB - [birth year], QIH:[id number]  Admit date - 08/26/2020   Admitting Physician Orene Desanctis, DO  Outpatient Primary MD for the patient is [name], MD  Outpatient specialists:   LOS - 0  days   Medical records reviewed and are as summarized below:    Chief Complaint  Patient presents with   Abnormal Lab    Showing renal failure       Brief summary   Patient is a 87 year old male with dementia, ischemic cardiomyopathy s/p CABG, combined systolic and diastolic CHF, history of bradycardia s/p AICD mesenteric thrombus on Coumadin, nonalcoholic cirrhosis, hypertension, Crohn's disease, OSA, BPH, hyperlipidemia presented with acute kidney injury.  Wife at the bedside and provided history as patient is voluntarily non verbal.  Wife had reported that patient was feeling unwell for past 3 weeks, talking less and mostly just sitting at home.  Also reported significant bilateral lower extremity edema for the past 3 weeks, worsening appetite, poor oral intake however continue to drink plenty of fluids. Patient had seen EP cardiology on 7/25 and was recommended to go to ED due to worsening renal function, torsemide was increased during the visit.  Lisinopril was decreased.  Also reported diarrhea. Also noted to have supratherapeutic INR, last dose of Coumadin on 7/25 In ED, patient was hypotensive with systolic in 87F, bradycardiac with heart rate 159, hemoglobin 9.3, platelets 39, chronic Creatinine 4.69, INR 5 CT abdomen showed stigmata of cirrhosis with splenomegaly, moderate ascites, anasarca including circumferential body wall edema and bilateral pleural effusions.  Edematous appearance of much of the small bowel and colon, typical for portal enteropathy/colopathy.    Assessment & Plan    Principal Problem: History of  cirrhosis, nonalcoholic ([place]) with concern for decompensation, hepatorenal syndrome, acute metabolic encephalopathy -Patient noted to have significant bilateral lower extremity edema and diffuse anasarca -BP currently soft and worsening renal function, precludes aggressive diuresis -GI consulted, will follow recommendations, may need albumin, midodrine -Unclear if mental status worse due to deep compensated liver cirrhosis, will check ammonia level.  No UTI  Active Problems: Hypotension -Hold antihypertensives, diuretics, beta-blocker -Will likely need albumin and midodrine, will follow GI recommendations  Acute renal failure -Likely due to increased diuretic dose and possible hepatorenal syndrome, hypotension -No urinary retention, -Presented with creatinine of 4.69, was 1.8 on 05/06/2020 -Creatinine improving today 3.76  Supratherapeutic INR, history of mesenteric thrombosis on Coumadin -INR 5.0 on admission, will recheck today, continue to hold Coumadin  History of combined systolic and diastolic CHF, ischemic cardiomyopathy status post CABG with significant volume overload, anasarca -Currently torsemide, lisinopril, Coreg on hold due to hypotension -Due to acute kidney injury, hypotension, unable to aggressively diurese -Will obtain renal consult, concern for hepatorenal syndrome -Also placed palliative medicine consult for goals of care  History of bradycardia as s/p ICD -Currently no acute issues  Chronic thrombocytopenia -Has chronic thrombocytopenia, baseline in 50's -Presented with platelets 39K, currently  no bleeding, monitor counts  Infrarenal AAA -Incidental finding on CT 3.3 cm -Ultrasound 3 years follow-up recommended  Dementia -Continue Aricept  Code Status: full  DVT Prophylaxis:  SCDs Start: 08/27/20 0020   Level of Care: Level of care: Progressive Family Communication: Discussed all imaging results, lab results, explained to the patient and daughter, Claiborne Billings  on phone     Disposition Plan:     Status is: Inpatient  Remains inpatient appropriate because:Inpatient level of care appropriate due to severity of illness  Dispo: The patient is from: Home              Anticipated d/c is to: Home              Patient currently is not medically stable to d/c.   Difficult to place patient No      Time Spent in minutes   85mns   Procedures:  None  Consultants:   Gastroenterology Nephrology  Antimicrobials:   Anti-infectives (From admission, onward)    None          Medications  Scheduled Meds: Continuous Infusions: PRN Meds:.      Subjective:   JTylen Leverichwas seen and examined today.  States feels cold otherwise no complaints.  Difficult obtain review of system from the patient.  Overnight no acute issues, no fevers or chills, no nausea vomiting or abdominal pain.  No chest pain.  Objective:   Vitals:   08/27/20 0930 08/27/20 1000 08/27/20 1010 08/27/20 1030  BP: 96/63 98/62  99/60  Pulse: (!) 58 (!) 58  (!) 59  Resp: 16 15  13   Temp:   (!) 97.4 F (36.3 C)   TempSrc:   Oral   SpO2: 97% 96%  96%  Weight:      Height:        Intake/Output Summary (Last 24 hours) at 08/27/2020 1147 Last data filed at 08/27/2020 04193Gross per 24 hour  Intake --  Output 500 ml  Net -500 ml     Wt Readings from Last 3 Encounters:  08/26/20 75.8 kg  08/24/20 76.7 kg  05/06/20 77.6 kg     Exam General: Alert and oriented x self, NAD Cardiovascular: S1 S2 auscultated, RRR, ICD Respiratory: Diminished breath sound at the bases Gastrointestinal: Soft, nontender, nondistended, + bowel sounds Ext: 3+ pedal edema bilaterally Neuro: moving all 4 extremities spontaneously Musculoskeletal: No digital cyanosis, clubbing Skin: No rashes Psych: dementia   Data Reviewed:  I have personally reviewed following labs and imaging studies  Micro Results Recent Results (from the past 240 hour(s))  Resp Panel by RT-PCR (Flu A&B,  Covid) Nasopharyngeal Swab     Status: None   Collection Time: 08/27/20  3:12 AM   Specimen: Nasopharyngeal Swab; Nasopharyngeal(NP) swabs in vial transport medium  Result Value Ref Range Status   SARS Coronavirus 2 by RT PCR NEGATIVE NEGATIVE Final    Comment: (NOTE) SARS-CoV-2 target nucleic acids are NOT DETECTED.  The SARS-CoV-2 RNA is generally detectable in upper respiratory specimens during the acute phase of infection. The lowest concentration of SARS-CoV-2 viral copies this assay can detect is 138 copies/mL. A negative result does not preclude SARS-Cov-2 infection and should not be used as the sole basis for treatment or other patient management decisions. A negative result may occur with  improper specimen collection/handling, submission of specimen other than nasopharyngeal swab, presence of viral mutation(s) within the areas targeted by this assay, and inadequate number of viral copies(<138 copies/mL). A  negative result must be combined with clinical observations, patient history, and epidemiological information. The expected result is Negative.  Fact Sheet for Patients:  EntrepreneurPulse.com.au  Fact Sheet for Healthcare Providers:  IncredibleEmployment.be  This test is no t yet approved or cleared by the [place] FDA and  has been authorized for detection and/or diagnosis of SARS-CoV-2 by FDA under an Emergency Use Authorization (EUA). This EUA will remain  in effect (meaning this test can be used) for the duration of the COVID-19 declaration under Section 564(b)(1) of the Act, 21 U.S.C.section 360bbb-3(b)(1), unless the authorization is terminated  or revoked sooner.       Influenza A by PCR NEGATIVE NEGATIVE Final   Influenza B by PCR NEGATIVE NEGATIVE Final    Comment: (NOTE) The Xpert Xpress SARS-CoV-2/FLU/RSV plus assay is intended as an aid in the diagnosis of influenza from Nasopharyngeal swab specimens and should  not be used as a sole basis for treatment. Nasal washings and aspirates are unacceptable for Xpert Xpress SARS-CoV-2/FLU/RSV testing.  Fact Sheet for Patients: EntrepreneurPulse.com.au  Fact Sheet for Healthcare Providers: IncredibleEmployment.be  This test is not yet approved or cleared by the [place] FDA and has been authorized for detection and/or diagnosis of SARS-CoV-2 by FDA under an Emergency Use Authorization (EUA). This EUA will remain in effect (meaning this test can be used) for the duration of the COVID-19 declaration under Section 564(b)(1) of the Act, 21 U.S.C. section 360bbb-3(b)(1), unless the authorization is terminated or revoked.  Performed at [facility], [place] [address] [place], [place] [address]     Radiology Reports CT Abdomen Pelvis Wo Contrast  Addendum Date: 08/26/2020   ADDENDUM REPORT: 08/26/2020 23:20 ADDENDUM: Additional impression point: Multiple bowel containing ventral hernias in a configuration similar to prior without evidence of high-grade obstruction at this time. Electronically Signed   By: Lovena Le M.D.   On: 08/26/2020 23:20   Result Date: 08/26/2020 CLINICAL DATA:  Acute renal failure EXAM: CT ABDOMEN AND PELVIS WITHOUT CONTRAST TECHNIQUE: Multidetector CT imaging of the abdomen and pelvis was performed following the standard protocol without IV contrast. COMPARISON:  CT 08/18/2019 FINDINGS: Lower chest: Trace bilateral effusions. Subpleural reticular changes are similar to comparison prior and may reflect some chronic interstitial change. Cardiomegaly. Hypoattenuation of the cardiac blood pool compatible with anemia. Myocardial calcification at the cardiac apex may reflect sequela of prior ischemia/infarct. Three-vessel coronary artery atherosclerosis. AICD with leads at the right atrium, coronary sinus and cardiac apex. No pericardial effusion. Hepatobiliary: Coarse, heterogeneous  appearance of the liver with markedly nodular surface contour and scattered parenchymal calcifications. No focal renal lesion is seen. Prior cholecystectomy. No discernible biliary ductal dilatation. Pancreas: Pancreatic atrophy. No focal peripancreatic inflammation or ductal dilatation. Vascular calcium along the splenic artery. Spleen: Splenomegaly. Normal in size. No concerning splenic lesions. Adrenals/Urinary Tract: Lobular thickening of the adrenal glands is similar to priors. Stable appearance of a probable cyst in the upper pole right kidney (2/31). Multiple bilateral renal sinus calcifications, possible vascular calcium versus nonobstructive nephrolithiasis. No hydronephrosis or obstructing urolithiasis. Urinary bladder is unremarkable for the degree of distention. Keyhole defect towards the level the prostate may reflect prior trans urethral resection. Stomach/Bowel: Circumferential thickening of the distal thoracic esophagus and possible thick edematous changes of the underdistended stomach. Duodenum and small bowel appears mildly edematous diffusely without conspicuous distension. Several loops of small bowel protrude into multiple small ventral hernias without evidence of frank obstruction or other concerning acute abnormality of the herniated  bowel loops. Diffuse edematous mural thickening throughout the colonic segments, most pronounced of the cecum and transverse colon. No evidence of bowel obstruction. Appendix is seen in the right lower quadrant. No focal periappendiceal inflammation to suggest acute appendicitis. Vascular/Lymphatic: Extensive atherosclerotic calcification throughout the abdominal aorta and branch vessels. Focal ectasia and probable eccentric mural thrombus seen of the infrarenal abdominal aorta with a maximal diameter of up to 3.3 cm, not significantly changed when measured at similar level on comparison prior. Extensive upper abdominal venous collateralization. Incompletely  assessed in the absence of contrast media. Few prominent gastrohepatic nodes and porta hepatis lymph nodes are seen. Additional edematous nodes seen in the mid mesentery and retroperitoneum. Reproductive: Coarse calcifications of the prostate appear largely eccentric. Keyhole defect suggesting prior trans urethral resection. Other: Diffuse circumferential body wall edema increasing edematous changes in the mesentery with moderate volume ascites. No free air. No organized abscess or collection. Multiple small broad-based ventral hernia defects with protruding loops of small bowel. No evidence of resulting obstruction at these levels. Additional fat containing right inguinal hernia. Musculoskeletal: The osseous structures appear diffusely demineralized which may limit detection of small or nondisplaced fractures. Scattered benign-appearing sclerotic bone islands most pronounced in the right ilium. Mild levocurvature lumbar spine. Multilevel degenerative changes are present in the imaged portions of the spine. Stable appearance of a tiny intramuscular probable lipoma of the right iliopsoas near the right ilium. IMPRESSION: 1. Stigmata of cirrhosis including a diminutive, nodular and heterogeneous liver with splenomegaly, upper abdominal venous collateralization and moderate volume ascites and diffuse features of anasarca including circumferential body wall edema and bilateral pleural effusions. Nonspecific gastrohepatic and porta hepatis adenopathy, can be seen in the setting of portal hypertension and cirrhosis though should continue attention on subsequent imaging. 2. Edematous appearance of much of the small bowel and colon, particularly involving the proximal colonic segments, are in a pattern very typical for portal enteropathy/colopathy. Should correlate with clinical findings to exclude a superimposed enterocolitis. 3. Mildly atrophic appearance of the kidneys. No hydronephrosis or obstructive urolithiasis.  Scattered nonobstructing nephroliths versus vascular calcium. 4. Cardiomegaly. Calcification of the myometrium at the cardiac apex can reflect sequela of prior infarct. Coronary artery atherosclerosis. AICD. 5.  Aortic Atherosclerosis (ICD10-I70.0). 6. Stable infrarenal abdominal aortic aneurysm measuring up to 3.3 cm with lamellated calcification. Recommend follow-up ultrasound every 3 years. This recommendation follows ACR consensus guidelines: White Paper of the ACR Incidental Findings Committee II on Vascular Findings. J Am Coll Radiol 2013; 10:789-794. Electronically Signed: By: Lovena Le M.D. On: 08/26/2020 23:10   CUP PACEART REMOTE DEVICE CHECK  Result Date: 08/20/2020 Scheduled remote reviewed. Normal device function.  No new events. Next remote 91 days.   Lab Data:  CBC: Recent Labs  Lab 08/25/20 1331 08/26/20 2011  WBC 6.3 4.7  NEUTROABS  --  1.7  HGB 9.3* 9.3*  HCT 27.5* 28.7*  MCV 91 95.3  PLT Comment 39*   Basic Metabolic Panel: Recent Labs  Lab 08/25/20 1327 08/26/20 2011 08/27/20 0539  NA 144 140 143  K 4.3 4.6 3.7  CL 109* 109 118*  CO2 19* 23 22  GLUCOSE 100* 84 69*  BUN 42* 49* 44*  CREATININE 4.60* 4.69* 3.76*  CALCIUM 8.2* 9.0 7.3*   GFR: Estimated Creatinine Clearance: 14.3 mL/min (A) (by C-G formula based on SCr of 3.76 mg/dL (H)). Liver Function Tests: Recent Labs  Lab 08/26/20 2011  AST 25  ALT 14  ALKPHOS 42  BILITOT 1.4*  PROT 4.7*  ALBUMIN 2.9*   No results for input(s): LIPASE, AMYLASE in the last 168 hours. No results for input(s): AMMONIA in the last 168 hours. Coagulation Profile: Recent Labs  Lab 08/25/20 1213 08/26/20 2011  INR 4.2* 5.0*   Cardiac Enzymes: No results for input(s): CKTOTAL, CKMB, CKMBINDEX, TROPONINI in the last 168 hours. BNP (last 3 results) Recent Labs    01/22/20 1430 05/06/20 1302  PROBNP 1,003* 975*   HbA1C: No results for input(s): HGBA1C in the last 72 hours. CBG: Recent Labs  Lab  08/27/20 0655  GLUCAP 72   Lipid Profile: No results for input(s): CHOL, HDL, LDLCALC, TRIG, CHOLHDL, LDLDIRECT in the last 72 hours. Thyroid Function Tests: No results for input(s): TSH, T4TOTAL, FREET4, T3FREE, THYROIDAB in the last 72 hours. Anemia Panel: No results for input(s): VITAMINB12, FOLATE, FERRITIN, TIBC, IRON, RETICCTPCT in the last 72 hours. Urine analysis:    Component Value Date/Time   COLORURINE STRAW (A) 08/27/2020 0040   APPEARANCEUR CLEAR 08/27/2020 0040   APPEARANCEUR Cloudy (A) 09/11/2019 1055   LABSPEC 1.006 08/27/2020 0040   PHURINE 5.0 08/27/2020 0040   GLUCOSEU NEGATIVE 08/27/2020 0040   GLUCOSEU NEGATIVE 11/10/2015 1220   HGBUR NEGATIVE 08/27/2020 0040   BILIRUBINUR NEGATIVE 08/27/2020 0040   BILIRUBINUR Negative 09/11/2019 1055   KETONESUR NEGATIVE 08/27/2020 0040   PROTEINUR NEGATIVE 08/27/2020 0040   UROBILINOGEN 1.0 11/10/2015 1220   NITRITE NEGATIVE 08/27/2020 0040   LEUKOCYTESUR TRACE (A) 08/27/2020 0040     Jurline Folger M.D. [facility] 08/27/2020, 11:47 AM  Available via Epic secure chat 7am-7pm After 7 pm, please refer to night coverage provider listed on amion.

## 2020-08-27 NOTE — Plan of Care (Signed)

## 2020-08-27 NOTE — Consult Note (Signed)
Renal Service Consult Note [facility]  JOSEL KEO 08/27/2020 Sol Blazing, MD Requesting Physician: Dr. Tana Coast  Reason for Consult: Renal failure HPI: The patient is a 87 y.o. year-old w/ hx of AAA, atrial fib, CAD sp CABG/ ICM sp ICD, chronic syst CHF, HTN, HL, CKD 3, cirrhosis (1245, nonalcoholic) presented to ED for abnormal kidney labs.  In ED work-up showed CT w/ ascites, cirrhosis w/ splenomegaly, anasarca and bilat pleural effusions. BP's are soft in the 90's - 100's. Creat is 4, baseline around 1.8- 2.1.  Asked to see for renal failure.   UOP about 825 cc overnight.  Pt seen in room, w/ dtr and wife.  Wife/ dtr note that his appetite has been dropping for several mos, and he is very fatigued as well. They say he won't eat anything. The last 2 months he has been sleeping a lot more than usual.  Wife says he doesn't call her by her name anymore.   LE edema has only been an issue for about a year or so.  Years ago he could heart failure/ CABG but didn't this the edema then.       ROS - denies CP, no joint pain, no HA, no blurry vision, no rash, no diarrhea, no nausea/ vomiting, no dysuria, no difficulty voiding   Past Medical History  Past Medical History:  Diagnosis Date   AAA (abdominal aortic aneurysm) (HCC)    Abdominal pain 04/14/2011   Acute bronchitis 09/02/2013   Acute vascular insufficiency of intestine (HCC)    Mesenteric embolus...surgery 2003   Allergic rhinitis 09/09/2013   Anxiety    Aortic atherosclerosis ([place]) 11/10/2015   Atrial fibrillation (HCC)    paroxysmal   Back pain 08/08/2016   Biventricular implantable cardiac defibrillator in situ    GDT CONTAK H170-MADIT-CRT-EXPLANTED 2011 implanted defibrillator- Guidant cognis, model n119-2001 Dr. Caryl Comes (11/28/2009)   Breast tenderness    Spironolactone was stopped and eplerinone started. Patient feels better   CAD (coronary artery disease)    CABG 1994  /   catheterization 1996, occluded vein  graft to the diagonal, other grafts patent  /   catheterization 2006, no PCI  /  nuclear, October, 2011, extensive scar anteroseptal and apical, no ischemia   Carotid artery disease ([place])    Doppler, February,  2012, 0-39% bilateral, recommended followup 1 year   Chronic systolic heart failure ([place])    chronic systolic    Chronotropic incompetence    Cirrhosis, nonalcoholic ([place]) 09/09/9831   Colonic polyp 2010   pathology not clear.    Crohn's disease ([place])    Degenerative joint disease    Dermatitis 04/05/2016   Dizziness    Positional dizziness   Edema leg    Venous Dopplers 8/13: No DVT bilaterally   Ejection fraction    EF 35%, echo, November, 2011  //   Is 35-40%, echo,  September 23, 2011    Fatigue 06/25/2008   Qualifier: Diagnosis of  By: [name], MD, [facility], Dorinda Hill    GERD 02/21/2007   Qualifier: Diagnosis of  By: Ronnald Ramp CNA/MA, Jessica     Hyperglycemia 08/08/2016   Hyperlipemia    Hypertension    Hypertrophy of prostate with urinary obstruction and other lower urinary tract symptoms (LUTS)    Hypoalbuminemia 06/16/2013   Hypokalemia 06/18/2013   Implantable cardioverter-defibrillator (ICD) in situ 12/09/2009   Annotation: EXPLANTED 2011   //   New Guidant ICD implanted 2011      Iron  deficiency anemia    Ischemic cardiomyopathy    CABG 1994 CAD catherization 1996.[name] occluded vein graft to the diagnol, other grafts patent/  catherization 2006, no PCI/ nuclear.[name] october 2011.[name] extensive scar anteroseptal and apical.. 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 november 2009/ ef 35%.[name] echo november 2011   Oral thrush 02/05/2015   OSA (obstructive sleep apnea) 09/16/2011   NPSG 2013:  AHI 34/hr.     Polymyalgia rheumatica (HCC)    Shingles    Sinus bradycardia    Skin cancer 08/08/2016   Thrombocytopenia ([place]) 2008   Thrush     Tremor    Ventral hernia    multiple, wears an abd binder, reports he hs been told he was at increased riisk for surgery   Vitamin B12 deficiency    Vitamin D deficiency 11/06/2008   Qualifier: Diagnosis of  By: Lenna Gilford MD, Deborra Medina    Warfarin anticoagulation    Coumadin therapy for mesenteric embolus 2003   Past Surgical History  Past Surgical History:  Procedure Laterality Date   BIV ICD GENERATOR CHANGEOUT N/A 10/23/2017   Procedure: BIV ICD GENERATOR CHANGEOUT;  Surgeon: Deboraha Sprang, MD;  Location: [facility];  Service: Cardiovascular;  Laterality: N/A;   CATARACT EXTRACTION, BILATERAL  2014   COLONOSCOPY N/A 04/19/2013   Procedure: COLONOSCOPY;  Surgeon: Jerene Bears, MD;  Location: [facility] ENDOSCOPY;  Service: Endoscopy;  Laterality: N/A;   CORONARY ARTERY BYPASS GRAFT  1994   Elap w/ superior mesenteric artery embolectomy  1/03   by Dr. Jennette Banker   ESOPHAGOGASTRODUODENOSCOPY N/A 04/18/2013   Procedure: ESOPHAGOGASTRODUODENOSCOPY (EGD);  Surgeon: Jerene Bears, MD;  Location: [facility] ENDOSCOPY;  Service: Endoscopy;  Laterality: N/A;   implantation of biventric cardioverter-defibrillatorr     by Dr. Lovena Le in 8/06 Guidant Contak H170-explanted 2011   implantation of guidant cognis device     model n119-2011   laparoscopic cholecystectomy  2002   left inguinal hernia repair with mesh  6/08   by Dr. Betsy Pries   PROSTATE SURGERY  08/2012   by urology in [place]   Repair of incarcerated ventral hernia and lysis of adhesions  11/03   by Dr. Betsy Pries   Family History  Family History  Problem Relation Age of Onset   Heart disease Mother    Tremor Sister    Heart failure Sister    Heart disease Father 62   Hypertension Father    Appendicitis Brother        ruptured   Diabetes Son    Heart disease Sister    Hypertension Sister    Dementia Sister    Heart disease Sister    Hypertension Brother    Kidney disease Brother    Myasthenia gravis Brother    Cirrhosis Brother     Leukemia Brother    Hypertension Other    Hyperlipidemia Other    Heart disease Sister    Edema Sister        pedal edema   Social History  reports that he quit smoking about 47 years ago. His smoking use included cigarettes. He has a 20.00 pack-year smoking history. He has never used smokeless tobacco. He reports current alcohol use. He reports that he does not use drugs. Allergies No Known Allergies Home medications Prior to Admission  medications   Medication Sig Start Date End Date Taking? Authorizing Provider  acetaminophen (TYLENOL) 650 MG CR tablet Take 650 mg by mouth daily as needed for pain.    [provider]  albuterol (VENTOLIN HFA) 108 (90 Base) MCG/ACT inhaler Inhale 2 puffs into the lungs every 4 (four) hours as needed for wheezing or shortness of breath. 02/14/20   [name], MD  Ascorbic Acid (VITAMIN C) 100 MG tablet Take 100 mg by mouth daily.    [provider]  carvedilol (COREG) 6.25 MG tablet TAKE 1 TABLET BY MOUTH TWICE DAILY WITH A MEAL 07/06/20   [name], MD  cholecalciferol (VITAMIN D) 1000 UNITS tablet Take 1,000 Units by mouth every evening.    [provider]  cholestyramine (QUESTRAN) 4 g packet Take 1 packet (4 g total) by mouth daily. 09/04/18   Pyrtle, Lajuan Lines, MD  donepezil (ARICEPT) 10 MG tablet Take 1 tablet (10 mg total) by mouth at bedtime. 02/17/20   [name], MD  fluticasone (FLOVENT HFA) 220 MCG/ACT inhaler Inhale 1 puff into the lungs in the morning and at bedtime. 02/14/20   [name], MD  hydrocortisone cream 1 % Apply 1 application topically daily as needed for itching.    [provider]  levocetirizine (XYZAL) 5 MG tablet TAKE 1 TABLET BY MOUTH ONCE DAILY IN THE EVENING 08/18/20   [name], MD  lisinopril (ZESTRIL) 2.5 MG tablet Take 1 tablet (2.5 mg total) by mouth daily. 08/24/20   Deboraha Sprang, MD  Loperamide HCl (LOPERAMIDE A-D PO) Take 1 tablet by mouth 2 (two) times daily as  needed (Diarrhea).    [provider]  magnesium gluconate (MAGONATE) 500 MG tablet Take 500 mg by mouth as needed (cramps).    [provider]  memantine (NAMENDA) 10 MG tablet Take 1 tablet by mouth twice daily 05/13/20   [name], MD  Menthol, Topical Analgesic, (BIOFREEZE EX) Apply 1 application topically daily as needed (pain).    [provider]  pantoprazole (PROTONIX) 40 MG tablet TAKE 1 TABLET BY MOUTH ONCE DAILY AT NOON 04/08/20   Shelda Pal, DO  potassium chloride SA (KLOR-CON) 20 MEQ tablet Take 1 tablet (20 mEq total) by mouth daily. 04/30/20   Dorothy Spark, MD  Simethicone (GAS-X MAXIMUM STRENGTH PO) Take 1-2 tablets by mouth daily as needed (Flatulence).    [provider]  simvastatin (ZOCOR) 20 MG tablet TAKE 1 TABLET BY MOUTH AT BEDTIME 06/03/20   [name], MD  torsemide (DEMADEX) 20 MG tablet Alternate between 9m (2 -tablets) and 846m(4 tablets) daily 08/24/20   KlDeboraha SprangMD  vitamin B-12 (CYANOCOBALAMIN) 1000 MCG tablet Take 500 mcg by mouth daily.     [provider]  warfarin (COUMADIN) 5 MG tablet TAKE 1/2-1 TABLET DAILY  AS DIRECTED BY THE COUMADIN CLINIC 08/18/20   CoSherren MochaMD     Vitals:   08/27/20 1000 08/27/20 1010 08/27/20 1030 08/27/20 1147  BP: 98/62  99/60 108/65  Pulse: (!) 58  (!) 59 (!) 59  Resp: 15  13 14   Temp:  (!) 97.4 F (36.3 C)  (!) 97.5 F (36.4 C)  TempSrc:  Oral  Oral  SpO2: 96%  96% 99%  Weight:      Height:       Exam Gen alert, no distress, frail elderly male No rash, cyanosis or gangrene Sclera anicteric, throat clear  No jvd or bruits Chest clear bilat to bases, no rales/ wheezing RRR no MRG Abd soft ntnd no mass, mild ascites, several soft herniations, nontender GU normal male MS no joint effusions or deformity Ext 2+ pitting pedal and pretib edema, no other edema, no wounds or ulcers Neuro is a bit lethargic and quiet / withdrawn, no  asterixis, +muscle wasting     Home meds include coreg 6.25 bid, questran, lisinopril 2.5 qd, namenda, protonix, Kdur, zocor, demadex, coumdain    Na 140  K 4.6  CO2  23  BUN 49  Cr 4.69    UNa 39   UCr 59   UA wnl       Date   Creat  eGFR    2008- 2016   0.8- 1.1  70-105    2017- 2019   1.02- 1.36 65- 90    Mar 09 2018   1.04  68    July 2021   1.40- 1.42 45- 48    Dec 2021   1.75  34    Feb 05 2020   2.09  28    May 06, 2020   1.88   08/25/20   4.60    Assessment/ Plan: AKI on CKD 3B - b/l creatinine from Dec 21- Jan '22 is 1.8- 2.1, eGFR 28-34.  In pt w/ cirrhosis and progressive anorexia, fatigue, 2+ pretib edema, known hx of cirrhosis. GI is seeing, w/u for SBP in progress.  CT showed no hydro. UA is wnl. BP's soft, but urine lytes do not suggest HRS w/ UNa > 20. Suspect some intravasc vol depletion + ACEi effects causing hemodynamic renal failure +/- ATN.  Making urine, creat down today. Recommend cont to hold all BP lowering meds for now, will try midodrine given low BP's for short-term assistance in renal perfusion. Avoid acei/ ARB in cirrhotic indefinitely. Will follow.  Cirrhosis - GI is evaluating. +splenomegaly on CT HTN - on BB, acei, torsemide at home. Hold all for now.  Dementia - on namenda Atrial fib - on coumadin CAD hx of CABG/ ICD/ ICM      Rob Janalynn Eder  MD 08/27/2020, 2:39 PM  Recent Labs  Lab 08/25/20 1331 08/26/20 2011  WBC 6.3 4.7  HGB 9.3* 9.3*   Recent Labs  Lab 08/26/20 2011 08/27/20 0539  K 4.6 3.7  BUN 49* 44*  CREATININE 4.69* 3.76*  CALCIUM 9.0 7.3*

## 2020-08-27 NOTE — ED Notes (Signed)
Floor was called to assign "purple man" for nurse to get report. Staff informed me the nurse was with another pt at this time and nurse would be assigned as soon as she finished with pt she was with. Charge nurse notified.

## 2020-08-27 NOTE — ED Notes (Signed)
Patient given a sandwich and apple juice

## 2020-08-27 NOTE — ED Notes (Addendum)
Floor nurse stated pt was ready to come up.

## 2020-08-28 ENCOUNTER — Inpatient Hospital Stay (HOSPITAL_COMMUNITY): Payer: Medicare HMO

## 2020-08-28 ENCOUNTER — Other Ambulatory Visit (HOSPITAL_COMMUNITY): Payer: Medicare HMO

## 2020-08-28 DIAGNOSIS — N179 Acute kidney failure, unspecified: Secondary | ICD-10-CM | POA: Diagnosis not present

## 2020-08-28 DIAGNOSIS — K7460 Unspecified cirrhosis of liver: Secondary | ICD-10-CM | POA: Diagnosis not present

## 2020-08-28 DIAGNOSIS — N183 Chronic kidney disease, stage 3 unspecified: Secondary | ICD-10-CM

## 2020-08-28 LAB — PROTIME-INR
INR: 3.6 — ABNORMAL HIGH (ref 0.8–1.2)
INR: 2.9 — ABNORMAL HIGH (ref 0.8–1.2)
Prothrombin Time: 36.1 seconds — ABNORMAL HIGH (ref 11.4–15.2)
Prothrombin Time: 30.6 seconds — ABNORMAL HIGH (ref 11.4–15.2)

## 2020-08-28 LAB — COMPREHENSIVE METABOLIC PANEL
ALT: 13 U/L (ref 0–44)
AST: 20 U/L (ref 15–41)
Albumin: 2.8 g/dL — ABNORMAL LOW (ref 3.5–5.0)
Alkaline Phosphatase: 53 U/L (ref 38–126)
Anion gap: 9 (ref 5–15)
BUN: 54 mg/dL — ABNORMAL HIGH (ref 8–23)
CO2: 22 mmol/L (ref 22–32)
Calcium: 8.7 mg/dL — ABNORMAL LOW (ref 8.9–10.3)
Chloride: 111 mmol/L (ref 98–111)
Creatinine, Ser: 4.25 mg/dL — ABNORMAL HIGH (ref 0.61–1.24)
GFR, Estimated: 13 mL/min — ABNORMAL LOW (ref >60)
Glucose, Bld: 77 mg/dL (ref 70–99)
Potassium: 4.2 mmol/L (ref 3.5–5.1)
Sodium: 142 mmol/L (ref 135–145)
Total Bilirubin: 1.4 mg/dL — ABNORMAL HIGH (ref 0.3–1.2)
Total Protein: 4.5 g/dL — ABNORMAL LOW (ref 6.5–8.1)

## 2020-08-28 LAB — CBC
HCT: 27.3 % — ABNORMAL LOW (ref 39.0–52.0)
Hemoglobin: 9 g/dL — ABNORMAL LOW (ref 13.0–17.0)
MCH: 30.9 pg (ref 26.0–34.0)
MCHC: 33 g/dL (ref 30.0–36.0)
MCV: 93.8 fL (ref 80.0–100.0)
Platelets: 49 10*3/uL — ABNORMAL LOW (ref 150–400)
RBC: 2.91 MIL/uL — ABNORMAL LOW (ref 4.22–5.81)
RDW: 14.7 % (ref 11.5–15.5)
WBC: 5.8 10*3/uL (ref 4.0–10.5)
nRBC: 0 % (ref 0.0–0.2)

## 2020-08-28 LAB — AFP TUMOR MARKER: AFP, Serum, Tumor Marker: 1.3 ng/mL (ref 0.0–6.4)

## 2020-08-28 LAB — UREA NITROGEN, URINE: Urea Nitrogen, Ur: 178 mg/dL

## 2020-08-28 MED ORDER — SODIUM CHLORIDE 0.9 % IV SOLN: INTRAVENOUS | Status: DC

## 2020-08-28 MED ORDER — DOXYCYCLINE HYCLATE 100 MG PO TABS
100.0000 mg | ORAL_TABLET | Freq: Two times a day (BID) | ORAL | Status: DC
  Administered 2020-08-28 – 2020-08-30 (×5): 100 mg via ORAL
  Filled 2020-08-28 (×5): qty 1

## 2020-08-28 MED ORDER — HALOPERIDOL LACTATE 5 MG/ML IJ SOLN: 2.0000 mg | Freq: Four times a day (QID) | INTRAMUSCULAR | Status: DC | PRN

## 2020-08-28 MED ORDER — LIDOCAINE HCL 1 % IJ SOLN
INTRAMUSCULAR | Status: AC
  Filled 2020-08-28: qty 10

## 2020-08-28 MED ORDER — OXYCODONE HCL 20 MG/ML PO CONC: 5.0000 mg | ORAL | Status: DC | PRN

## 2020-08-28 MED ORDER — PHYTONADIONE 5 MG PO TABS
5.0000 mg | ORAL_TABLET | Freq: Once | ORAL | Status: AC
  Administered 2020-08-28: 5 mg via ORAL
  Filled 2020-08-28: qty 1

## 2020-08-28 MED ORDER — ALBUMIN HUMAN 25 % IV SOLN
50.0000 g | Freq: Two times a day (BID) | INTRAVENOUS | Status: AC
  Administered 2020-08-28 (×2): 50 g via INTRAVENOUS
  Filled 2020-08-28 (×2): qty 200

## 2020-08-28 MED ORDER — SODIUM CHLORIDE 0.9 % IV BOLUS
500.0000 mL | Freq: Once | INTRAVENOUS | Status: AC
  Administered 2020-08-28: 500 mL via INTRAVENOUS

## 2020-08-28 NOTE — Progress Notes (Signed)
Family at bedside, seem to be cooping as well as possible given the situation. Very supportive of each other and of patient.   Bracelet applied to right wrist. His family hoping to take patient home as soon as he is stable enough to go.

## 2020-08-28 NOTE — Procedures (Signed)
Ultrasound-guided diagnostic paracentesis performed yielding 150 cc of  clear, yellow  fluid. No immediate complications. The fluid was sent to the lab for preordered studies. EBL none.

## 2020-08-28 NOTE — Progress Notes (Signed)
[facility] Progress Note  Subjective:UOP 1000 cc yesterday, creat up 4.69 today, pt not feeling any different, withdrawn and very weak  Vitals:   08/27/20 1620 08/28/20 0445 08/28/20 0800 08/28/20 1306  BP: 109/66  101/60 109/66  Pulse: 60  60 60  Resp: 18  18 18   Temp: 97.6 F (36.4 C)   97.7 F (36.5 C)  TempSrc:    Oral  SpO2: 98%  95% 96%  Weight:  75.3 kg    Height:        Exam: Gen alert, no distress, frail elderly male No rash, cyanosis or gangrene Sclera anicteric, throat clear No jvd or bruits Chest clear bilat to bases, no rales/ wheezing RRR no MRG Abd soft ntnd no mass, mild ascites, several soft herniations, nontender GU normal male MS no joint effusions or deformity Ext 2+ pitting pedal and pretib edema Neuro is quiet, withdrawn, no asterixis, +muscle wasting      Home meds include coreg 6.25 bid, questran, lisinopril 2.5 qd, namenda, protonix, Kdur, zocor, demadex, coumdain     Na 140  K 4.6  CO2  23  BUN 49  Cr 4.69    UNa 39   UCr 59   UA wnl        Date                                   Creat               eGFR    2008- 2016                           0.8- 1.1            70-105    2017- 2019                           1.02- 1.36        65- 90    Mar 09 2018                           1.04                 68    July 2021                              1.40- 1.42        45- 48    Dec 2021                              1.75                 34, stage IIIB    Feb 05 2020                           2.09                 28, stage IV    May 06, 2020                         1.88   08/25/20  4.60  11       Assessment/ Plan: AKI on CKD 4 - b/l creatinine from Jan '22 is 2.1, eGFR 28.  In pt w/ cirrhosis and progressive anorexia, fatigue, 2+ pretib edema, known hx of cirrhosis. GI is seeing, w/u for SBP in progress.  CT showed no hydro. UA is wnl. BP's soft, but urine lytes do not suggest HRS (UNa > 20). +ACEi and  intravasc vol depletion is possible cause. UA does not suggest GN. renal failure +/- ATN.  Making urine but creat up today. Pt is quite listless, which apparently has been going on for months. Could have early uremia. Will start trial of IVF's. Poor candidate for RRT.  Cont midodrine and IV albumin.  Cont to hold BP lowering meds. Poor prognosis. Will follow.  Cirrhosis - seen by GI, for diag paracentesis today. +splenomegaly on CT HTN - on BB, acei, torsemide at home. Hold all for now. FTT - wife describes significant decline in mobility, eating, conversation, oversleeping over the last few months.  Dementia - on namenda Atrial fib - on coumadin CAD hx of CABG/ ICD/ ICM GOC -  daughter wondering if we are possibly doing too many tests, if they should just take him home. Recommended palliative care consultation.        Rob Kanisha Duba 08/28/2020, 4:18 PM   Recent Labs  Lab 08/26/20 2011 08/27/20 0539 08/28/20 0354  K 4.6 3.7 4.2  BUN 49* 44* 54*  CREATININE 4.69* 3.76* 4.25*  CALCIUM 9.0 7.3* 8.7*  HGB 9.3*  --  9.0*   Inpatient medications:  donepezil  10 mg Oral QHS   doxycycline  100 mg Oral Q12H   feeding supplement  237 mL Oral BID BM   lidocaine       memantine  10 mg Oral BID   midodrine  10 mg Oral TID WC   vitamin B-12  500 mcg Oral Daily    sodium chloride     albumin human 50 g (08/28/20 1348)   sodium chloride     acetaminophen, albuterol, ondansetron (ZOFRAN) IV

## 2020-08-28 NOTE — Consult Note (Signed)
Palliative Care  Consultation Note  87 year old man with multiorgan failure, progressive dementia, weight loss and frailty admitted for renal failure after being seen by cardiology. On further evaluation he has significant anasarca, edematous small bowel and colon and is hypotensive with Scr 4.6 and INR of 5.  He has largely irreversible end stage disease involving both his liver and kidney-the bowel edema indicates there could be on going mesenteric ischemia which could cause significant pain and rapid decline.  His prognosis is very poor for meaningful survival and quality of life.  I met with his wife and daughter and explained the disease progression. We discussed how difficult it is for people with dementia to be hospitalized and have to undergo invasive and pain procedures. We discussed that the compassionate and humane choice and medically very appropriate option in this situation is to take him home with hospice care and provide him with comfort and dignity as he approaches the end of his life.  They are in agreement with home with hospice and are hoping he can get home first thing in AM. I have made the hospice referral.  Lane Hacker, DO Palliative Medicine  DNR order placed.  Time: 50 minutes Greater than 50%  of this time was spent counseling and coordinating care related to the above assessment and plan.

## 2020-08-28 NOTE — Progress Notes (Addendum)
Progress Note  Chief Complaint:    cirrhosis, Crohn's disease     Attending physician's note   I have taken an interval history, reviewed the chart and examined the patient. I agree with the Advanced Practitioner's note, impression and recommendations.   INR is trending down, 2.9 this afternoon Coumadin is on hold LFT remained stable  Creatinine elevated at 4.25 Urine sodium greater than 20  His clinical picture is not consistent with acute liver decompensation or hepatorenal syndrome  He has significant peripheral edema and anasarca Echocardiogram pending, will need to exclude CHF exacerbation  Is receiving IV albumin Await diagnostic paracentesis to exclude SBP, will also need to check ascites fluid albumin and protein along with cytology.  GI will continue to follow along   I have spent >35 minutes of patient care (this includes precharting, chart review, review of results, face-to-face time used for counseling as well as treatment plan and follow-up. The patient was provided an opportunity to ask questions and all were answered. The patient agreed with the plan and demonstrated an understanding of the instructions.  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 Admitted with AKI.  Not sure how much he is really decompensated from a liver standpoint. No jaundice. INR high but in setting of warfarin. He has ascites but is overall volume overloaded.  He doesn't seem encephalopathic.  No evidence for GI bleed. However he does have AKI which could be hepatorenal though diuretics recently increased and he hasn't been taking much PO. Nephrology following and doesn't feel this is HRS. MELD and Child Pugh will be high due INR / AKI. --Nephrology is following, suspects some intravascular volume depletion ( plus ACEi effects) rather than HRS.  --On renal diet with fluid restriction. --Avoid hepatotoxic medications. --Ammonia  60 yesterday but not overt HE on exam . Would not give Lactulose as he already has chronic loose stools --Will order diagnostic paracentesis , spoke with IR and they will likely be okay doing procedure with current INR of 3.6  --At some point needs an EGD for varices screening, especially since he requires anticoagulation. Last EGD was in 2005 -  no varices --HCC screening. CT scan mentions no focal "renal" lesions under hepatobiliary  but I suspect it was meant to read no focal liver lesions. AFP is normal.    Addendum: Discussed worsening renal function with Dr. Silverio Decamp. Will give Albumin 50 g BID today. Also requesting echocardiogram   # AKI on CKD3b. Creatine up overnight 3.76 >> 4.25.  Renal US shows CKD / ascites --Renal function worsened overnight. Nephrology is following, suspects some intravascular volume depletion ( plus ACEi effects) rather than HRS. Midodrine.  Add IV albumin?  --Ordering diagnostic paracentesis    # History of Crohn's of small bowel, s/p resection. Possibly right sided colon involvement. Mild edema of right colon on last colonoscopy ( done for bleeding) in 2015 but in setting of low serum albumin. Biopsies not taken due to recent bleeding. Patient lost to follow up , last seen in February 2020. No on any meds for IBD. No evidence for active IBD on noncontrast CTAP yesterday. Wife and daughter in room today and tell me patient may take Imodium 1-2 times a day several days out our the week.   --Given age and comorbities he probably will not be candidate for outpatient colonoscopy to follow up on Crohn's.  --He can take Imodium as  needed   # Supratherapeutic INR of 5 --Coumadin on hold. --INR 3.6 today. Received PO Vit K this am.  --check am INR.    # Thickening of distal esophagus on CT scan  --Can evaluate further at time of varices screening EGD. Could be esophagitis, maybe varices, neoplasm not excluded.    # Chronic anemia, Hgb stable at 9.0        DIAGNOSTIC STUDIES   Renal US IMPRESSION: 1. Echogenic and mildly atrophic kidneys consistent with chronic kidney disease. No hydronephrosis. The previously seen stones on the prior CT are not visualized with certainty on this ultrasound. 2. Cirrhosis and ascites.    SUBJECTIVE   No complaints from patient. Daughter in room and concerned about patient's poor appetite.        OBJECTIVE      Scheduled inpatient medications:   donepezil  10 mg Oral QHS   feeding supplement  237 mL Oral BID BM   memantine  10 mg Oral BID   midodrine  10 mg Oral TID WC   vitamin B-12  500 mcg Oral Daily   Continuous inpatient infusions:  PRN inpatient medications: acetaminophen, albuterol, ondansetron (ZOFRAN) IV  Vital signs in last 24 hours: Temp:  [97.4 F (36.3 C)-97.6 F (36.4 C)] 97.6 F (36.4 C) (07/28 1620) Pulse Rate:  [58-60] 60 (07/29 0800) Resp:  [13-18] 18 (07/29 0800) BP: (98-109)/(60-66) 101/60 (07/29 0800) SpO2:  [95 %-99 %] 95 % (07/29 0800) Weight:  [75.3 kg-75.8 kg] 75.3 kg (07/29 0445) Last BM Date: 08/26/20  Intake/Output Summary (Last 24 hours) at 08/28/2020 0933 Last data filed at 08/28/2020 0853 Gross per 24 hour  Intake 470 ml  Output 825 ml  Net -355 ml     Physical Exam:  General: Alert male in NAD. Daugher and wife in room Heart:  Regular rate  Pulmonary: Normal respiratory effort Abdomen: Soft, nondistended, nontender. Normal bowel sounds.  Neurologic: Alert and oriented to person, place and time. No asterixis Psych: Pleasant. Cooperative.   Filed Weights   08/26/20 2019 08/27/20 1440 08/28/20 0445  Weight: 75.8 kg 75.8 kg 75.3 kg    Intake/Output from previous day: 07/28 0701 - 07/29 0700 In: 470 [P.O.:470] Out: 525 [Urine:525] Intake/Output this shift: Total I/O In: -  Out: 300 [Urine:300]    Lab Results: Recent Labs    08/25/20 1331 08/26/20 2011 08/28/20 0354  WBC 6.3 4.7 5.8  HGB 9.3* 9.3* 9.0*  HCT 27.5* 28.7* 27.3*  PLT  Comment 39* 49*   BMET Recent Labs    08/26/20 2011 08/27/20 0539 08/28/20 0354  NA 140 143 142  K 4.6 3.7 4.2  CL 109 118* 111  CO2 23 22 22   GLUCOSE 84 69* 77  BUN 49* 44* 54*  CREATININE 4.69* 3.76* 4.25*  CALCIUM 9.0 7.3* 8.7*   LFT Recent Labs    08/28/20 0354  PROT 4.5*  ALBUMIN 2.8*  AST 20  ALT 13  ALKPHOS 53  BILITOT 1.4*   PT/INR Recent Labs    08/27/20 1310 08/28/20 0354  LABPROT 37.5* 36.1*  INR 3.8* 3.6*   Hepatitis Panel No results for input(s): HEPBSAG, HCVAB, HEPAIGM, HEPBIGM in the last 72 hours.  CT Abdomen Pelvis Wo Contrast  Addendum Date: 08/26/2020   ADDENDUM REPORT: 08/26/2020 23:20 ADDENDUM: Additional impression point: Multiple bowel containing ventral hernias in a configuration similar to prior without evidence of high-grade obstruction at this time. Electronically Signed   By: Lovena Le M.D.   On:  08/26/2020 23:20   Result Date: 08/26/2020 CLINICAL DATA:  Acute renal failure EXAM: CT ABDOMEN AND PELVIS WITHOUT CONTRAST TECHNIQUE: Multidetector CT imaging of the abdomen and pelvis was performed following the standard protocol without IV contrast. COMPARISON:  CT 08/18/2019 FINDINGS: Lower chest: Trace bilateral effusions. Subpleural reticular changes are similar to comparison prior and may reflect some chronic interstitial change. Cardiomegaly. Hypoattenuation of the cardiac blood pool compatible with anemia. Myocardial calcification at the cardiac apex may reflect sequela of prior ischemia/infarct. Three-vessel coronary artery atherosclerosis. AICD with leads at the right atrium, coronary sinus and cardiac apex. No pericardial effusion. Hepatobiliary: Coarse, heterogeneous appearance of the liver with markedly nodular surface contour and scattered parenchymal calcifications. No focal renal lesion is seen. Prior cholecystectomy. No discernible biliary ductal dilatation. Pancreas: Pancreatic atrophy. No focal peripancreatic inflammation or ductal  dilatation. Vascular calcium along the splenic artery. Spleen: Splenomegaly. Normal in size. No concerning splenic lesions. Adrenals/Urinary Tract: Lobular thickening of the adrenal glands is similar to priors. Stable appearance of a probable cyst in the upper pole right kidney (2/31). Multiple bilateral renal sinus calcifications, possible vascular calcium versus nonobstructive nephrolithiasis. No hydronephrosis or obstructing urolithiasis. Urinary bladder is unremarkable for the degree of distention. Keyhole defect towards the level the prostate may reflect prior trans urethral resection. Stomach/Bowel: Circumferential thickening of the distal thoracic esophagus and possible thick edematous changes of the underdistended stomach. Duodenum and small bowel appears mildly edematous diffusely without conspicuous distension. Several loops of small bowel protrude into multiple small ventral hernias without evidence of frank obstruction or other concerning acute abnormality of the herniated bowel loops. Diffuse edematous mural thickening throughout the colonic segments, most pronounced of the cecum and transverse colon. No evidence of bowel obstruction. Appendix is seen in the right lower quadrant. No focal periappendiceal inflammation to suggest acute appendicitis. Vascular/Lymphatic: Extensive atherosclerotic calcification throughout the abdominal aorta and branch vessels. Focal ectasia and probable eccentric mural thrombus seen of the infrarenal abdominal aorta with a maximal diameter of up to 3.3 cm, not significantly changed when measured at similar level on comparison prior. Extensive upper abdominal venous collateralization. Incompletely assessed in the absence of contrast media. Few prominent gastrohepatic nodes and porta hepatis lymph nodes are seen. Additional edematous nodes seen in the mid mesentery and retroperitoneum. Reproductive: Coarse calcifications of the prostate appear largely eccentric. Keyhole  defect suggesting prior trans urethral resection. Other: Diffuse circumferential body wall edema increasing edematous changes in the mesentery with moderate volume ascites. No free air. No organized abscess or collection. Multiple small broad-based ventral hernia defects with protruding loops of small bowel. No evidence of resulting obstruction at these levels. Additional fat containing right inguinal hernia. Musculoskeletal: The osseous structures appear diffusely demineralized which may limit detection of small or nondisplaced fractures. Scattered benign-appearing sclerotic bone islands most pronounced in the right ilium. Mild levocurvature lumbar spine. Multilevel degenerative changes are present in the imaged portions of the spine. Stable appearance of a tiny intramuscular probable lipoma of the right iliopsoas near the right ilium. IMPRESSION: 1. Stigmata of cirrhosis including a diminutive, nodular and heterogeneous liver with splenomegaly, upper abdominal venous collateralization and moderate volume ascites and diffuse features of anasarca including circumferential body wall edema and bilateral pleural effusions. Nonspecific gastrohepatic and porta hepatis adenopathy, can be seen in the setting of portal hypertension and cirrhosis though should continue attention on subsequent imaging. 2. Edematous appearance of much of the small bowel and colon, particularly involving the proximal colonic segments, are in a pattern very typical  for portal enteropathy/colopathy. Should correlate with clinical findings to exclude a superimposed enterocolitis. 3. Mildly atrophic appearance of the kidneys. No hydronephrosis or obstructive urolithiasis. Scattered nonobstructing nephroliths versus vascular calcium. 4. Cardiomegaly. Calcification of the myometrium at the cardiac apex can reflect sequela of prior infarct. Coronary artery atherosclerosis. AICD. 5.  Aortic Atherosclerosis (ICD10-I70.0). 6. Stable infrarenal abdominal  aortic aneurysm measuring up to 3.3 cm with lamellated calcification. Recommend follow-up ultrasound every 3 years. This recommendation follows ACR consensus guidelines: White Paper of the ACR Incidental Findings Committee II on Vascular Findings. J Am Coll Radiol 2013; 10:789-794. Electronically Signed: By: Lovena Le M.D. On: 08/26/2020 23:10   US RENAL  Result Date: 08/27/2020 CLINICAL DATA:  87 year old male with acute renal failure. EXAM: RENAL / URINARY TRACT ULTRASOUND COMPLETE COMPARISON:  Abdominal ultrasound dated 10/17/2018. CT abdomen pelvis dated 08/26/2020. FINDINGS: Right Kidney: Renal measurements: 10.6 x 4.6 x 5.4 cm = volume: 138 mL. The kidney is echogenic and mildly atrophic. No hydronephrosis or shadowing stone. Faint small hypoechoic lesions are not characterized. Left Kidney: Renal measurements: 10.5 x 5.0 x 5.6 cm = volume: 154 mL. The kidney is echogenic and mildly atrophic. No hydronephrosis or shadowing stone. Faint small hypoechoic lesions are not characterized. Bladder: Appears normal for degree of bladder distention. Other: There is cirrhosis and small ascites. IMPRESSION: 1. Echogenic and mildly atrophic kidneys consistent with chronic kidney disease. No hydronephrosis. The previously seen stones on the prior CT are not visualized with certainty on this ultrasound. 2. Cirrhosis and ascites. Electronically Signed   By: Anner Crete M.D.   On: 08/27/2020 18:52      Principal Problem:   Cirrhosis, nonalcoholic ([place]) Active Problems:   Implantable cardioverter-defibrillator (ICD) in situ   Mesenteric thrombosis (HCC)   Hx of CABG   OSA (obstructive sleep apnea)   Thrombocytopenia (HCC)   Paroxysmal atrial fibrillation (HCC)   Dementia (HCC)   AKI (acute kidney injury) ([place])   Liver failure (HCC)   Hypotension   Supratherapeutic INR   Chronic combined systolic and diastolic CHF (congestive heart failure) ([place])     LOS: 1 day   Tye Savoy ,NP 08/28/2020,  9:33 AM

## 2020-08-28 NOTE — Progress Notes (Signed)
[facility]                                                                              Patient Demographics  [name], is a 87 y.o. 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  Wife at the bedside and provided history as patient is voluntarily non verbal.  Wife had reported that patient was feeling unwell for past 3 weeks, talking less and mostly just sitting at home.  Also reported significant bilateral lower extremity edema for the past 3 weeks, worsening appetite, poor oral intake however continue to drink plenty of fluids. Patient had seen EP cardiology on 7/25 and was recommended to go to ED due to worsening renal function, torsemide was increased during the visit.  Lisinopril was decreased.  Also reported diarrhea. Also noted to have supratherapeutic INR, last dose of Coumadin on 7/25 In ED, patient was hypotensive with systolic in 10C, bradycardiac with heart rate 159, hemoglobin 9.3, platelets 39, chronic Creatinine 4.69, INR 5 CT abdomen showed stigmata of cirrhosis with splenomegaly, moderate ascites, anasarca including circumferential body wall edema and bilateral pleural effusions.  Edematous appearance of much of the small bowel and colon, typical for portal enteropathy/colopathy.    Assessment & Plan    Principal Problem: History of  cryptogenic cirrhosis, nonalcoholic ([place]) with concern for decompensation -Patient noted to have significant bilateral lower extremity edema and diffuse anasarca, poor appetite outpatient, lower extremity edema and anasarca -Nephrology, GI following, do not think this is hepatorenal syndrome unlikely renal function worse due to intravascular volume elevation (had recently increased torsemide, on ACE inhibitor) -INR reversed today, will get diagnostic paracentesis -Ammonia level 60, no lactulose at this time per GI -Start IV albumin 50 g twice daily, follow 2D echo  Active Problems: Hypotension -Hold antihypertensives, diuretics, beta-blocker -BP now stable, continue midodrine, starting IV albumin  Acute renal failure on CKD stage IIIb -Likely due to increased diuretic dose and possible hepatorenal syndrome, hypotension -No urinary retention, -Presented with creatinine of 4.69, was 1.8 on 05/06/2020 -Creatinine was 3.76 on 7/28, worsened to 4.25 -Renal ultrasound showed no hydronephrosis or obstruction, showed CKD -Nephrology following, appreciate recommendations  Supratherapeutic INR, 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 vitamin K this morning for diagnostic paracentesis - will check a.m. INR and resume Coumadin  History of combined systolic and diastolic CHF, ischemic cardiomyopathy status post CABG with significant volume overload, anasarca -Currently torsemide, lisinopril, Coreg on  hold due to hypotension -Due to acute kidney injury, hypotension, unable to aggressively diurese  Tick bite: -Patient noted to have tick attached to his right upper chest, unknown duration.  Patient loves gardening. -On exam has a red erythematous spot at the tick bite site.  Will empirically placed on doxycycline.  History of bradycardia as s/p ICD -Currently no acute issues  Chronic thrombocytopenia -Has chronic  thrombocytopenia, baseline in 50's -Presented with platelets 39K, currently no bleeding, monitor counts  Infrarenal AAA -Incidental finding on CT 3.3 cm -Ultrasound 3 years follow-up recommended  Dementia -Continue Aricept  History of Crohn's disease of small bowel status postresection -Per GI, not a candidate for outpatient colonoscopy with his age and comorbidities, can take Imodium as needed   Code Status: full  DVT Prophylaxis:  SCDs Start: 08/27/20 0020   Level of Care: Level of care: Progressive Family Communication: Discussed all imaging results, lab results, explained to the patient and daughter, Claiborne Billings at bedside   Disposition Plan:     Status is: Inpatient  Remains inpatient appropriate because:Inpatient level of care appropriate due to severity of illness  Dispo: The patient is from: Home              Anticipated d/c is to: Home              Patient currently is not medically stable to d/c.   Difficult to place patient No      Time Spent in minutes 35 minutes  Procedures:  Renal ultrasound  Consultants:   Gastroenterology Nephrology Palliative medicine  Antimicrobials:   Anti-infectives (From admission, onward)    Start     Dose/Rate Route Frequency Ordered Stop   08/28/20 1300  doxycycline (VIBRA-TABS) tablet 100 mg        100 mg Oral Every 12 hours 08/28/20 1120            Medications  Scheduled Meds: Continuous Infusions: PRN Meds:.      Subjective:   Sevyn Paredez was seen and examined today.  No complaints per the patient, no pain.  Daughter at the bedside, concerned about poor appetite, generalized weakness.  On examination had erythematous spot at the site of the tick attachment on the right upper chest.  No fevers or chills.   Objective:   Vitals:   08/27/20 1620 08/28/20 0445 08/28/20 0800 08/28/20 1306  BP: 109/66  101/60 109/66  Pulse: 60  60 60  Resp: 18  18 18   Temp: 97.6 F (36.4 C)   97.7 F (36.5 C)  TempSrc:     Oral  SpO2: 98%  95% 96%  Weight:  75.3 kg    Height:        Intake/Output Summary (Last 24 hours) at 08/28/2020 1311 Last data filed at 08/28/2020 0853 Gross per 24 hour  Intake 470 ml  Output 825 ml  Net -355 ml     Wt Readings from Last 3 Encounters:  08/28/20 75.3 kg  08/24/20 76.7 kg  05/06/20 77.6 kg   Physical Exam General: Dementia, otherwise alert and awake Cardiovascular: S1 S2 clear, RRR.  1+ pedal edema b/l Respiratory: CTAB, no wheezing, rales or rhonchi Gastrointestinal: Soft, nontender, nondistended, NBS Ext: 1+  pedal edema bilaterally Neuro: no new deficits Skin: Erythematous spot on the right upper chest Psych: dementia     Data Reviewed:  I have personally reviewed following labs and imaging studies  Micro Results Recent Results (from the past 240 hour(s))  Resp Panel by  RT-PCR (Flu A&B, Covid) Nasopharyngeal Swab     Status: None   Collection Time: 08/27/20  3:12 AM   Specimen: Nasopharyngeal Swab; Nasopharyngeal(NP) swabs in vial transport medium  Result Value Ref Range Status   SARS Coronavirus 2 by RT PCR NEGATIVE NEGATIVE Final    Comment: (NOTE) SARS-CoV-2 target nucleic acids are NOT DETECTED.  The SARS-CoV-2 RNA is generally detectable in upper respiratory specimens during the acute phase of infection. The lowest concentration of SARS-CoV-2 viral copies this assay can detect is 138 copies/mL. A negative result does not preclude SARS-Cov-2 infection and should not be used as the sole basis for treatment or other patient management decisions. A negative result may occur with  improper specimen collection/handling, submission of specimen other than nasopharyngeal swab, presence of viral mutation(s) within the areas targeted by this assay, and inadequate number of viral copies(<138 copies/mL). A negative result must be combined with clinical observations, patient history, and epidemiological information. The expected result is  Negative.  Fact Sheet for Patients:  EntrepreneurPulse.com.au  Fact Sheet for Healthcare Providers:  IncredibleEmployment.be  This test is no t yet approved or cleared by the [place] FDA and  has been authorized for detection and/or diagnosis of SARS-CoV-2 by FDA under an Emergency Use Authorization (EUA). This EUA will remain  in effect (meaning this test can be used) for the duration of the COVID-19 declaration under Section 564(b)(1) of the Act, 21 U.S.C.section 360bbb-3(b)(1), unless the authorization is terminated  or revoked sooner.       Influenza A by PCR NEGATIVE NEGATIVE Final   Influenza B by PCR NEGATIVE NEGATIVE Final    Comment: (NOTE) The Xpert Xpress SARS-CoV-2/FLU/RSV plus assay is intended as an aid in the diagnosis of influenza from Nasopharyngeal swab specimens and should not be used as a sole basis for treatment. Nasal washings and aspirates are unacceptable for Xpert Xpress SARS-CoV-2/FLU/RSV testing.  Fact Sheet for Patients: EntrepreneurPulse.com.au  Fact Sheet for Healthcare Providers: IncredibleEmployment.be  This test is not yet approved or cleared by the [place] FDA and has been authorized for detection and/or diagnosis of SARS-CoV-2 by FDA under an Emergency Use Authorization (EUA). This EUA will remain in effect (meaning this test can be used) for the duration of the COVID-19 declaration under Section 564(b)(1) of the Act, 21 U.S.C. section 360bbb-3(b)(1), unless the authorization is terminated or revoked.  Performed at [facility], [place] [address], [place], [place] [address]     Radiology Reports CT Abdomen Pelvis Wo Contrast  Addendum Date: 08/26/2020   ADDENDUM REPORT: 08/26/2020 23:20 ADDENDUM: Additional impression point: Multiple bowel containing ventral hernias in a configuration similar to prior without evidence of high-grade  obstruction at this time. Electronically Signed   By: Lovena Le M.D.   On: 08/26/2020 23:20   Result Date: 08/26/2020 CLINICAL DATA:  Acute renal failure EXAM: CT ABDOMEN AND PELVIS WITHOUT CONTRAST TECHNIQUE: Multidetector CT imaging of the abdomen and pelvis was performed following the standard protocol without IV contrast. COMPARISON:  CT 08/18/2019 FINDINGS: Lower chest: Trace bilateral effusions. Subpleural reticular changes are similar to comparison prior and may reflect some chronic interstitial change. Cardiomegaly. Hypoattenuation of the cardiac blood pool compatible with anemia. Myocardial calcification at the cardiac apex may reflect sequela of prior ischemia/infarct. Three-vessel coronary artery atherosclerosis. AICD with leads at the right atrium, coronary sinus and cardiac apex. No pericardial effusion. Hepatobiliary: Coarse, heterogeneous appearance of the liver with markedly nodular surface contour and scattered parenchymal calcifications. No focal  renal lesion is seen. Prior cholecystectomy. No discernible biliary ductal dilatation. Pancreas: Pancreatic atrophy. No focal peripancreatic inflammation or ductal dilatation. Vascular calcium along the splenic artery. Spleen: Splenomegaly. Normal in size. No concerning splenic lesions. Adrenals/Urinary Tract: Lobular thickening of the adrenal glands is similar to priors. Stable appearance of a probable cyst in the upper pole right kidney (2/31). Multiple bilateral renal sinus calcifications, possible vascular calcium versus nonobstructive nephrolithiasis. No hydronephrosis or obstructing urolithiasis. Urinary bladder is unremarkable for the degree of distention. Keyhole defect towards the level the prostate may reflect prior trans urethral resection. Stomach/Bowel: Circumferential thickening of the distal thoracic esophagus and possible thick edematous changes of the underdistended stomach. Duodenum and small bowel appears mildly edematous diffusely  without conspicuous distension. Several loops of small bowel protrude into multiple small ventral hernias without evidence of frank obstruction or other concerning acute abnormality of the herniated bowel loops. Diffuse edematous mural thickening throughout the colonic segments, most pronounced of the cecum and transverse colon. No evidence of bowel obstruction. Appendix is seen in the right lower quadrant. No focal periappendiceal inflammation to suggest acute appendicitis. Vascular/Lymphatic: Extensive atherosclerotic calcification throughout the abdominal aorta and branch vessels. Focal ectasia and probable eccentric mural thrombus seen of the infrarenal abdominal aorta with a maximal diameter of up to 3.3 cm, not significantly changed when measured at similar level on comparison prior. Extensive upper abdominal venous collateralization. Incompletely assessed in the absence of contrast media. Few prominent gastrohepatic nodes and porta hepatis lymph nodes are seen. Additional edematous nodes seen in the mid mesentery and retroperitoneum. Reproductive: Coarse calcifications of the prostate appear largely eccentric. Keyhole defect suggesting prior trans urethral resection. Other: Diffuse circumferential body wall edema increasing edematous changes in the mesentery with moderate volume ascites. No free air. No organized abscess or collection. Multiple small broad-based ventral hernia defects with protruding loops of small bowel. No evidence of resulting obstruction at these levels. Additional fat containing right inguinal hernia. Musculoskeletal: The osseous structures appear diffusely demineralized which may limit detection of small or nondisplaced fractures. Scattered benign-appearing sclerotic bone islands most pronounced in the right ilium. Mild levocurvature lumbar spine. Multilevel degenerative changes are present in the imaged portions of the spine. Stable appearance of a tiny intramuscular probable lipoma of  the right iliopsoas near the right ilium. IMPRESSION: 1. Stigmata of cirrhosis including a diminutive, nodular and heterogeneous liver with splenomegaly, upper abdominal venous collateralization and moderate volume ascites and diffuse features of anasarca including circumferential body wall edema and bilateral pleural effusions. Nonspecific gastrohepatic and porta hepatis adenopathy, can be seen in the setting of portal hypertension and cirrhosis though should continue attention on subsequent imaging. 2. Edematous appearance of much of the small bowel and colon, particularly involving the proximal colonic segments, are in a pattern very typical for portal enteropathy/colopathy. Should correlate with clinical findings to exclude a superimposed enterocolitis. 3. Mildly atrophic appearance of the kidneys. No hydronephrosis or obstructive urolithiasis. Scattered nonobstructing nephroliths versus vascular calcium. 4. Cardiomegaly. Calcification of the myometrium at the cardiac apex can reflect sequela of prior infarct. Coronary artery atherosclerosis. AICD. 5.  Aortic Atherosclerosis (ICD10-I70.0). 6. Stable infrarenal abdominal aortic aneurysm measuring up to 3.3 cm with lamellated calcification. Recommend follow-up ultrasound every 3 years. This recommendation follows ACR consensus guidelines: White Paper of the ACR Incidental Findings Committee II on Vascular Findings. J Am Coll Radiol 2013; 10:789-794. Electronically Signed: By: Lovena Le M.D. On: 08/26/2020 23:10   US RENAL  Result Date: 08/27/2020 CLINICAL DATA:  87 year old  male with acute renal failure. EXAM: RENAL / URINARY TRACT ULTRASOUND COMPLETE COMPARISON:  Abdominal ultrasound dated 10/17/2018. CT abdomen pelvis dated 08/26/2020. FINDINGS: Right Kidney: Renal measurements: 10.6 x 4.6 x 5.4 cm = volume: 138 mL. The kidney is echogenic and mildly atrophic. No hydronephrosis or shadowing stone. Faint small hypoechoic lesions are not characterized. Left  Kidney: Renal measurements: 10.5 x 5.0 x 5.6 cm = volume: 154 mL. The kidney is echogenic and mildly atrophic. No hydronephrosis or shadowing stone. Faint small hypoechoic lesions are not characterized. Bladder: Appears normal for degree of bladder distention. Other: There is cirrhosis and small ascites. IMPRESSION: 1. Echogenic and mildly atrophic kidneys consistent with chronic kidney disease. No hydronephrosis. The previously seen stones on the prior CT are not visualized with certainty on this ultrasound. 2. Cirrhosis and ascites. Electronically Signed   By: Anner Crete M.D.   On: 08/27/2020 18:52   CUP PACEART REMOTE DEVICE CHECK  Result Date: 08/20/2020 Scheduled remote reviewed. Normal device function.  No new events. Next remote 91 days.   Lab Data:  CBC: Recent Labs  Lab 08/25/20 1331 08/26/20 2011 08/28/20 0354  WBC 6.3 4.7 5.8  NEUTROABS  --  1.7  --   HGB 9.3* 9.3* 9.0*  HCT 27.5* 28.7* 27.3*  MCV 91 95.3 93.8  PLT Comment 39* 49*   Basic Metabolic Panel: Recent Labs  Lab 08/25/20 1327 08/26/20 2011 08/27/20 0539 08/28/20 0354  NA 144 140 143 142  K 4.3 4.6 3.7 4.2  CL 109* 109 118* 111  CO2 19* 23 22 22   GLUCOSE 100* 84 69* 77  BUN 42* 49* 44* 54*  CREATININE 4.60* 4.69* 3.76* 4.25*  CALCIUM 8.2* 9.0 7.3* 8.7*   GFR: Estimated Creatinine Clearance: 12.6 mL/min (A) (by C-G formula based on SCr of 4.25 mg/dL (H)). Liver Function Tests: Recent Labs  Lab 08/26/20 2011 08/28/20 0354  AST 25 20  ALT 14 13  ALKPHOS 42 53  BILITOT 1.4* 1.4*  PROT 4.7* 4.5*  ALBUMIN 2.9* 2.8*   No results for input(s): LIPASE, AMYLASE in the last 168 hours. Recent Labs  Lab 08/27/20 1310  AMMONIA 60*   Coagulation Profile: Recent Labs  Lab 08/25/20 1213 08/26/20 2011 08/27/20 1310 08/28/20 0354 08/28/20 1228  INR 4.2* 5.0* 3.8* 3.6* 2.9*   Cardiac Enzymes: No results for input(s): CKTOTAL, CKMB, CKMBINDEX, TROPONINI in the last 168 hours. BNP (last 3  results) Recent Labs    01/22/20 1430 05/06/20 1302  PROBNP 1,003* 975*   HbA1C: No results for input(s): HGBA1C in the last 72 hours. CBG: Recent Labs  Lab 08/27/20 0655  GLUCAP 72   Lipid Profile: No results for input(s): CHOL, HDL, LDLCALC, TRIG, CHOLHDL, LDLDIRECT in the last 72 hours. Thyroid Function Tests: No results for input(s): TSH, T4TOTAL, FREET4, T3FREE, THYROIDAB in the last 72 hours. Anemia Panel: No results for input(s): VITAMINB12, FOLATE, FERRITIN, TIBC, IRON, RETICCTPCT in the last 72 hours. Urine analysis:    Component Value Date/Time   COLORURINE STRAW (A) 08/27/2020 0040   APPEARANCEUR CLEAR 08/27/2020 0040   APPEARANCEUR Cloudy (A) 09/11/2019 1055   LABSPEC 1.006 08/27/2020 0040   PHURINE 5.0 08/27/2020 0040   GLUCOSEU NEGATIVE 08/27/2020 0040   GLUCOSEU NEGATIVE 11/10/2015 1220   HGBUR NEGATIVE 08/27/2020 0040   BILIRUBINUR NEGATIVE 08/27/2020 0040   BILIRUBINUR Negative 09/11/2019 1055   KETONESUR NEGATIVE 08/27/2020 0040   PROTEINUR NEGATIVE 08/27/2020 0040   UROBILINOGEN 1.0 11/10/2015 1220   NITRITE NEGATIVE 08/27/2020 0040  LEUKOCYTESUR TRACE (A) 08/27/2020 0040     Crystalmarie Yasin M.D. [facility] 08/28/2020, 1:11 PM  Available via Epic secure chat 7am-7pm After 7 pm, please refer to night coverage provider listed on amion.

## 2020-08-29 DIAGNOSIS — N170 Acute kidney failure with tubular necrosis: Secondary | ICD-10-CM

## 2020-08-29 DIAGNOSIS — I5042 Chronic combined systolic (congestive) and diastolic (congestive) heart failure: Secondary | ICD-10-CM | POA: Diagnosis not present

## 2020-08-29 DIAGNOSIS — K7210 Chronic hepatic failure without coma: Secondary | ICD-10-CM

## 2020-08-29 DIAGNOSIS — K7460 Unspecified cirrhosis of liver: Secondary | ICD-10-CM | POA: Diagnosis not present

## 2020-08-29 DIAGNOSIS — R601 Generalized edema: Secondary | ICD-10-CM

## 2020-08-29 DIAGNOSIS — N179 Acute kidney failure, unspecified: Secondary | ICD-10-CM | POA: Diagnosis not present

## 2020-08-29 LAB — BASIC METABOLIC PANEL
Anion gap: 8 (ref 5–15)
BUN: 54 mg/dL — ABNORMAL HIGH (ref 8–23)
CO2: 24 mmol/L (ref 22–32)
Calcium: 8.6 mg/dL — ABNORMAL LOW (ref 8.9–10.3)
Chloride: 110 mmol/L (ref 98–111)
Creatinine, Ser: 4.18 mg/dL — ABNORMAL HIGH (ref 0.61–1.24)
GFR, Estimated: 13 mL/min — ABNORMAL LOW (ref >60)
Glucose, Bld: 73 mg/dL (ref 70–99)
Potassium: 4.1 mmol/L (ref 3.5–5.1)
Sodium: 142 mmol/L (ref 135–145)

## 2020-08-29 NOTE — TOC Initial Note (Signed)
Transition of Care [facility]) - Initial/Assessment Note    Patient Details  Name: [name] MRN: [id number] Date of Birth: 1931-03-08  Transition of Care [facility]) CM/SW Contact:    Dessa Phi, RN Phone Number: 08/29/2020, 2:26 PM  Clinical Narrative:  Referral for home w/hospice choice-spoke to spouse-Jackie/dtr Claiborne Billings & other family members outside rm about services-family had already contacted [facility] for delivery today-hospital bed/overbed table-Hospice will manage arrangements for delivery. Family currently state the will transport home on own.DNR in shadow chart for MD signature. MD to address ICD w/family. Continue to follow.                Expected Discharge Plan: Home w Hospice Care Barriers to Discharge: Continued Medical Work up   Patient Goals and CMS Choice Patient states their goals for this hospitalization and ongoing recovery are:: go home w/hospice CMS Medicare.gov Compare Post Acute Care list provided to:: Patient Represenative (must comment) (spouse Jackie/dtr Claiborne Billings) Choice offered to / list presented to : Spouse, Adult Children  Expected Discharge Plan and Services Expected Discharge Plan: Home w Hospice Care   Discharge Planning Services: CM Consult Post Acute Care Choice: Hospice Living arrangements for the past 2 months: [place] Agency:  ([facility]) Date [place]: 08/29/20 Time HH Agency Contacted: 73 Representative spoke with at [place]: Tharon Aquas  Prior Living Arrangements/Services Living arrangements for the past 2 months: [place] with:: Spouse Patient language and need for interpreter reviewed:: Yes Do you feel safe going back to the place where you live?: Yes      Need for Family Participation in Patient Care: No (Comment) Care giver support system in place?: Yes (comment)   Criminal Activity/Legal Involvement  Pertinent to Current Situation/Hospitalization: No - Comment as needed  Activities of Daily Living Home Assistive Devices/Equipment: None ADL Screening (condition at time of admission) Patient's cognitive ability adequate to safely complete daily activities?: Yes Is the patient deaf or have difficulty hearing?: No Does the patient have difficulty seeing, even when wearing glasses/contacts?: No Does the patient have difficulty concentrating, remembering, or making decisions?: Yes Patient able to express need for assistance with ADLs?: Yes Does the patient have difficulty dressing or bathing?: No Independently performs ADLs?: No Communication: Independent Dressing (OT): Independent Grooming: Independent Feeding: Independent Bathing: Independent Toileting: Independent In/Out Bed: Independent with device (comment) Walks in Home: Independent Does the patient have difficulty walking or climbing stairs?: Yes Weakness of Legs: Both Weakness of Arms/Hands: None  Permission Sought/Granted Permission sought to share information with : Case Manager Permission granted to share information with : Yes, Verbal Permission Granted  Share Information with NAME: Case Manager     Permission granted to share info w Relationship: Kelly dtr [phone]     Emotional Assessment Appearance:: Appears stated age Attitude/Demeanor/Rapport: Gracious Affect (typically observed): Accepting Orientation: : Oriented to Self Alcohol / Substance Use: Not Applicable Psych Involvement: No (comment)  Admission diagnosis:  AKI (acute kidney injury) ([place]) [N17.9] Supratherapeutic INR [R79.1] Acute renal failure, unspecified acute renal failure type [facility]) [N17.9] Patient Active Problem List   Diagnosis Date Noted   Liver failure ([place]) 08/27/2020   Hypotension 08/27/2020   Supratherapeutic INR 08/27/2020   Chronic combined systolic and diastolic CHF (congestive heart failure) ([place]) 08/27/2020  Aneurysm of  infrarenal abdominal aorta (HCC) 08/27/2020   Viral illness 02/16/2020   Acute renal failure superimposed on stage 3 chronic kidney disease ([place]) 02/06/2020   Skin lesion of left arm 08/26/2019   Hematuria 08/26/2019   Dementia ([place]) 12/16/2018   Paroxysmal atrial fibrillation ([place]) 04/02/2018   Encounter for therapeutic drug monitoring 03/08/2017   Cognitive impairment 02/27/2017   Preventative health care 10/17/2016   Skin cancer 08/08/2016   Hyperglycemia 08/08/2016   Back pain 08/08/2016   Dermatitis 04/05/2016   Aortic atherosclerosis ([place]) 11/10/2015   Acute on chronic combined systolic and diastolic CHF (congestive heart failure) ([place]) 02/06/2015   Cirrhosis, nonalcoholic ([place]) 17/35/6701   Allergic rhinitis 09/09/2013   Hypokalemia 06/18/2013   Hypoalbuminemia 06/16/2013   Left ankle pain 06/07/2013   Thrombocytopenia ([place]) 11/30/2011   Edema leg    OSA (obstructive sleep apnea) 09/16/2011   Abdominal pain 04/14/2011   Hx of CABG    Hypertension    Chronic systolic heart failure (HCC)    Mitral regurgitation    Chronotropic incompetence    Hyperlipemia    Crohn's disease of large intestine (HCC)    Ventral hernia    Tremor    Warfarin anticoagulation    Carotid artery disease ([place])    Mesenteric thrombosis ([place]) 03/12/2010   Implantable cardioverter-defibrillator (ICD) in situ 12/09/2009   HYPERTROPHY PROSTATE W/UR OBST & OTH LUTS 03/31/2009   Vitamin B 12 deficiency 11/06/2008   Vitamin D deficiency 11/06/2008   Cardiomyopathy, ischemic 09/15/2008   LBBB (left bundle branch block) 06/25/2008   Fatigue 06/25/2008   COLONIC POLYPS 03/23/2007   POLYMYALGIA RHEUMATICA 03/23/2007   GERD 02/21/2007   PCP:  [name], MD Pharmacy:   [place], [place] - [address] [place] [address] Phone: [phone] Fax: [phone]     Social Determinants of Health (SDOH) Interventions    Readmission Risk  Interventions No flowsheet data found.

## 2020-08-29 NOTE — Plan of Care (Signed)

## 2020-08-29 NOTE — Progress Notes (Signed)
[name] Progress Note  Subjective:UOP 1000 cc yesterday, creat up 4.69 today, pt not feeling any different, withdrawn and very weak  Vitals:   08/28/20 1635 08/29/20 0201 08/29/20 0500 08/29/20 1438  BP: 122/65 (!) 98/53  128/71  Pulse: 60 60  60  Resp:  18  14  Temp:  97.9 F (36.6 C)  98.2 F (36.8 C)  TempSrc:  Oral  Oral  SpO2: 100% 93%  98%  Weight:   75.3 kg   Height:        Exam: Gen alert, no distress, frail elderly male No rash, cyanosis or gangrene Sclera anicteric, throat clear No jvd or bruits Chest clear bilat to bases, no rales/ wheezing RRR no MRG Abd soft ntnd no mass, mild ascites, several soft herniations, nontender GU normal male MS no joint effusions or deformity Ext 2+ pitting pedal and pretib edema Neuro is quiet, withdrawn, no asterixis, +muscle wasting      Home meds include coreg 6.25 bid, questran, lisinopril 2.5 qd, namenda, protonix, Kdur, zocor, demadex, coumdain     Na 140  K 4.6  CO2  23  BUN 49  Cr 4.69    UNa 39   UCr 59   UA wnl        Date                                   Creat               eGFR    2008- 2016                           0.8- 1.1            70-105    2017- 2019                           1.02- 1.36        65- 90    Mar 09 2018                           1.04                 68    July 2021                              1.40- 1.42        45- 48    Dec 2021                              1.75                 34, stage IIIB    Feb 05 2020                           2.09                 28, stage IV    May 06, 2020                         1.88   08/25/20  4.60  11       Assessment/ Plan: AKI on CKD 4 - b/l creatinine from Jan '22 is 2.1, eGFR 28.  In pt w/ cirrhosis and progressive anorexia, fatigue, 2+ pretib edema, known hx of cirrhosis. GI is seeing, w/u for SBP per GI.  CT showed no hydro. UA is wnl. BP's soft, but urine lytes do not suggest HRS (UNa > 20). +ACEi and  intravasc vol depletion is possible cause. UA does not suggest GN. Creat in low to mid 4's.  Started trial of IVF"s, would not do more than another 24 hrs given risk for vol overload. He is a very poor candidate for dialysis. Suspect some of this decline is related to uremia. Family asking to take home pt tomorrow as hospice patient, agree w/ this plan.  Cirrhosis - seen by GI, for diag paracentesis today. +splenomegaly on CT HTN - on BB, acei, torsemide at home. Holding all for now. FTT - wife describes significant decline in mobility, eating, conversation, oversleeping over the last few months.   Dementia - on namenda Atrial fib - on coumadin CAD hx of CABG/ ICD/ ICM     [name] 08/29/2020, 3:19 PM   Recent Labs  Lab 08/26/20 2011 08/27/20 0539 08/28/20 0354 08/29/20 0420  K 4.6   < > 4.2 4.1  BUN 49*   < > 54* 54*  CREATININE 4.69*   < > 4.25* 4.18*  CALCIUM 9.0   < > 8.7* 8.6*  HGB 9.3*  --  9.0*  --    < > = values in this interval not displayed.    Inpatient medications:  donepezil  10 mg Oral QHS   doxycycline  100 mg Oral Q12H   feeding supplement  237 mL Oral BID BM   memantine  10 mg Oral BID   midodrine  10 mg Oral TID WC   vitamin B-12  500 mcg Oral Daily    sodium chloride 75 mL/hr at 08/29/20 0903   acetaminophen, albuterol, haloperidol lactate, ondansetron (ZOFRAN) IV, oxyCODONE

## 2020-08-29 NOTE — Progress Notes (Addendum)
[facility] ROUNDING NOTE   Subjective: Feels tired.  Creatinine remains elevated >4   Objective: Vital signs in last 24 hours: Temp:  [97.7 F (36.5 C)-97.9 F (36.6 C)] 97.9 F (36.6 C) (07/30 0201) Pulse Rate:  [60] 60 (07/30 0201) Resp:  [18] 18 (07/30 0201) BP: (98-122)/(53-67) 98/53 (07/30 0201) SpO2:  [93 %-100 %] 93 % (07/30 0201) Weight:  [75.3 kg] 75.3 kg (07/30 0500) Last BM Date: 08/27/20 General: NAD   Intake/Output from previous day: 07/29 0701 - 07/30 0700 In: 896.9 [P.O.:300; I.V.:271.5; IV Piggyback:325.4] Out: 300 [Urine:300] Intake/Output this shift: No intake/output data recorded.   Lab Results: Recent Labs    08/26/20 2011 08/28/20 0354  WBC 4.7 5.8  HGB 9.3* 9.0*  PLT 39* 49*  MCV 95.3 93.8   BMET Recent Labs    08/27/20 0539 08/28/20 0354 08/29/20 0420  NA 143 142 142  K 3.7 4.2 4.1  CL 118* 111 110  CO2 22 22 24   GLUCOSE 69* 77 73  BUN 44* 54* 54*  CREATININE 3.76* 4.25* 4.18*  CALCIUM 7.3* 8.7* 8.6*   LFT Recent Labs    08/26/20 2011 08/28/20 0354  PROT 4.7* 4.5*  ALBUMIN 2.9* 2.8*  AST 25 20  ALT 14 13  ALKPHOS 42 53  BILITOT 1.4* 1.4*   PT/INR Recent Labs    08/28/20 0354 08/28/20 1228  INR 3.6* 2.9*      Imaging/Other results: US RENAL  Result Date: 08/27/2020 CLINICAL DATA:  87 year old male with acute renal failure. EXAM: RENAL / URINARY TRACT ULTRASOUND COMPLETE COMPARISON:  Abdominal ultrasound dated 10/17/2018. CT abdomen pelvis dated 08/26/2020. FINDINGS: Right Kidney: Renal measurements: 10.6 x 4.6 x 5.4 cm = volume: 138 mL. The kidney is echogenic and mildly atrophic. No hydronephrosis or shadowing stone. Faint small hypoechoic lesions are not characterized. Left Kidney: Renal measurements: 10.5 x 5.0 x 5.6 cm = volume: 154 mL. The kidney is echogenic and mildly atrophic. No hydronephrosis or shadowing stone. Faint small hypoechoic lesions are not characterized. Bladder: Appears normal for  degree of bladder distention. Other: There is cirrhosis and small ascites. IMPRESSION: 1. Echogenic and mildly atrophic kidneys consistent with chronic kidney disease. No hydronephrosis. The previously seen stones on the prior CT are not visualized with certainty on this ultrasound. 2. Cirrhosis and ascites. Electronically Signed   By: Anner Crete M.D.   On: 08/27/2020 18:52   US Paracentesis  Result Date: 08/28/2020 INDICATION: Patient with history of dementia, cryptogenic cirrhosis, acute kidney injury, ascites; request received for diagnostic paracentesis. EXAM: ULTRASOUND GUIDED DIAGNOSTIC PARACENTESIS MEDICATIONS: 1% lidocaine to skin and subcutaneous tissue COMPLICATIONS: None immediate. PROCEDURE: Informed written consent was obtained from the patient's family after a discussion of the risks, benefits and alternatives to treatment. A timeout was performed prior to the initiation of the procedure. Initial ultrasound scanning demonstrates a moderate amount of ascites within the left mid to lower abdominal quadrant. The left mid to lower abdomen was prepped and draped in the usual sterile fashion. 1% lidocaine was used for local anesthesia. Following this, a 19 gauge, 10-cm, Yueh catheter was introduced. An ultrasound image was saved for documentation purposes. The paracentesis was performed. The catheter was removed and a dressing was applied. The patient tolerated the procedure well without immediate post procedural complication. FINDINGS: A total of approximately 150 cc of clear, yellow fluid was removed. Samples were sent to the laboratory as requested by the clinical team. IMPRESSION: Successful ultrasound-guided diagnostic paracentesis yielding 150 cc of  peritoneal fluid. Read by: Rowe Robert, PA-C Electronically Signed   By: Sandi Mariscal M.D.   On: 08/28/2020 16:05      Assessment &Plan  87 year old gentleman with history of Crohn's disease, CAD with ischemic cardiomyopathy (EF 35%),  cryptogenic cirrhosis, A. fib on Coumadin admitted with worsening renal function  Cirrhosis: No evidence of significant acute liver decompensation LFT at baseline Has chronic volume overload from CHF, CKD and cirrhosis INR supratherapeutic on Coumadin, has trended down since holding Coumadin S/p diagnostic paracentesis yesterday, cell count, albumin and protein from ascites fluid has not resulted yet? Clinically less likely to be SBP, no leukocytosis or abdominal tenderness/discomfort  Acute kidney injury on chronic kidney: Urine sodium>20, not consistent with hepatorenal syndrome  He received IV albumin yesterday and midodrine.  He is getting IV fluids today Will need to be cautious to prevent volume overload and CHF exacerbation Fluid management per nephrology  Family had discussion with palliative care yesterday and opted for hospice care.  Family would like to get him home from hospital as soon as possible  GI will sign off, is available if have any questions  This visit required 35 minutes of patient care (this includes precharting, chart review, review of results, face-to-face time used for counseling as well as treatment plan and follow-up. The family at bedside/patient were provided an opportunity to ask questions and all were answered. They agreed with the plan and demonstrated an understanding of the instructions.   Damaris Hippo , MD [phone]  [facility]

## 2020-08-29 NOTE — Progress Notes (Signed)
[facility]                                                                              Patient Demographics  [name], is a 87 y.o. 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  Wife at the bedside and provided history as patient is voluntarily non verbal.  Wife had reported that patient was feeling unwell for past 3 weeks, talking less and mostly just sitting at home.  Also reported significant bilateral lower extremity edema for the past 3 weeks, worsening appetite, poor oral intake however continue to drink plenty of fluids. Patient had seen EP cardiology on 7/25 and was recommended to go to ED due to worsening renal function, torsemide was increased during the visit.  Lisinopril was decreased.  Also reported diarrhea. Also noted to have supratherapeutic INR, last dose of Coumadin on 7/25 In ED, patient was hypotensive with systolic in 03O, bradycardiac with heart rate 159, hemoglobin 9.3, platelets 39, chronic Creatinine 4.69, INR 5 CT abdomen showed stigmata of cirrhosis with splenomegaly, moderate ascites, anasarca including circumferential body wall edema and bilateral pleural effusions.  Edematous appearance of much of the small bowel and colon, typical for portal enteropathy/colopathy.    Assessment & Plan    Principal Problem: History of  cryptogenic cirrhosis, nonalcoholic ([place])  -Patient noted to have significant bilateral lower extremity edema and diffuse anasarca, poor appetite outpatient, lower extremity edema and anasarca -Nephrology, GI following, less likely to be hepatorenal syndrome and renal function worse due to intravascular volume elevation (had recently increased torsemide, on ACE inhibitor, poor p.o. intake) -Status post diagnostic paracentesis, labs still pending -Ammonia level 60, no lactulose at this time per GI -Received IV albumin and midodrine   Active Problems: Hypotension -Hold antihypertensives, diuretics, beta-blocker -Received IV albumin.  Acute renal failure on CKD stage IIIb -Likely due to increased diuretic dose and possible hepatorenal syndrome, hypotension -No urinary retention, -Presented with creatinine of 4.69, was 1.8 on 05/06/2020 -Renal ultrasound showed no hydronephrosis or obstruction, showed CKD -Nephrology following, received IV albumin on 7/29, trial of IV fluids.  Creatinine marginally improved to 4.1 today from 4.25 -Palliative medicine consulted, [place] meeting yesterday with Dr. Hilma Favors.  Plan for hospice if no further improvement in renal function. - Discussed with patient's family this morning, they want to continue IV fluids today.  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 - Awaiting INR,  if less than 2 will resume Coumadin  History of combined systolic and diastolic CHF, ischemic cardiomyopathy status post CABG with significant volume overload, anasarca -Currently torsemide, lisinopril, Coreg on hold due to hypotension -Due to acute kidney injury, hypotension, unable to aggressively diurese -Cautiously monitor the fluid hydration to avoid fluid overload  Tick bite: -Patient noted to have tick attached to his right upper chest, unknown duration.   Patient loves gardening. -On exam has a red erythematous spot at the tick bite site.   -Continue doxycycline  History of bradycardia as s/p ICD -Currently no acute issues  Chronic thrombocytopenia -Has chronic thrombocytopenia, baseline in 50's -Platelets 49K on 7/29, no bleeding  Infrarenal AAA -Incidental finding on CT 3.3 cm -Ultrasound 3 years follow-up recommended  Dementia -Continue Aricept  History of Crohn's disease of small bowel status postresection -Per GI, not a candidate for outpatient colonoscopy with his age and comorbidities, can take Imodium as needed   Code Status: DNR DVT Prophylaxis:  SCDs Start: 08/27/20 0020   Level of Care: Level of care: Progressive Family Communication: Discussed all imaging results, lab results, explained to the patient, daughter and granddaughter at the bedside.  Plan is to continue IV fluid hydration today, if no improvement in renal function, family wishes to take him home with hospice.    Disposition Plan:     Status is: Inpatient  Remains inpatient appropriate because:Inpatient level of care appropriate due to severity of illness  Dispo: The patient is from: Home              Anticipated d/c is to: Home              Patient currently is not medically stable to d/c.   Difficult to place patient No      Time Spent in minutes 25 minutes  Procedures:  Renal ultrasound Paracentesis 7/29  Consultants:   Gastroenterology Nephrology Palliative medicine  Antimicrobials:   Anti-infectives (From admission, onward)    Start     Dose/Rate Route Frequency Ordered Stop   08/28/20 1300  doxycycline (VIBRA-TABS) tablet 100 mg        100 mg Oral Every 12 hours 08/28/20 1120            Medications  Scheduled Meds: Continuous Infusions: PRN Meds:.      Subjective:   [name] was seen and examined today.  Much more alert and awake today, daughter and granddaughter at the bedside.  Denies any pain.   Overnight no acute issues.  No fevers.  Creatinine has not improved.   Objective:   Vitals:   08/28/20 1500 08/28/20 1635 08/29/20 0201 08/29/20 0500  BP: 120/67 122/65 (!) 98/53   Pulse: 60 60 60   Resp:   18   Temp:   97.9 F (36.6 C)   TempSrc:   Oral   SpO2: 99% 100% 93%   Weight:    75.3 kg  Height:        Intake/Output Summary (Last 24 hours) at 08/29/2020 0814 Last data filed at 08/29/2020 0330 Gross per 24 hour  Intake 896.91 ml  Output 300 ml  Net 596.91 ml     Wt Readings from Last 3 Encounters:  08/29/20 75.3 kg  08/24/20 76.7 kg  05/06/20 77.6 kg   Physical Exam General: Alert and awake, NAD, appears close to his baseline Cardiovascular: S1 S2 clear, RRR.  1+ pedal edema b/l Respiratory: Diminished breath sound at the bases but no rhonchi or wheezing.  Gastrointestinal: Soft, nontender, nondistended, NBS Ext: 1+ pedal edema bilaterally Neuro: no acute neurodeficits    Data Reviewed:  I have personally reviewed following labs and imaging studies  Micro Results Recent Results (from the past 240 hour(s))  Resp Panel by RT-PCR (Flu A&B, Covid) Nasopharyngeal Swab     Status: None   Collection Time: 08/27/20  3:12 AM   Specimen: Nasopharyngeal Swab; Nasopharyngeal(NP) swabs in vial transport medium  Result Value Ref Range Status   SARS Coronavirus 2 by RT PCR NEGATIVE NEGATIVE Final    Comment: (NOTE) SARS-CoV-2 target nucleic acids are NOT DETECTED.  The SARS-CoV-2 RNA is generally detectable in upper respiratory specimens during the acute phase of infection. The lowest concentration of SARS-CoV-2 viral copies this assay can detect is 138 copies/mL. A negative result does not preclude SARS-Cov-2 infection and should not be used as the sole basis for treatment or other patient management decisions. A negative result may occur with  improper specimen collection/handling, submission of specimen other than nasopharyngeal swab, presence of viral mutation(s)  within the areas targeted by this assay, and inadequate number of viral copies(<138 copies/mL). A negative result must be combined with clinical observations, patient history, and epidemiological information. The expected result is Negative.  Fact Sheet for Patients:  EntrepreneurPulse.com.au  Fact Sheet for Healthcare Providers:  IncredibleEmployment.be  This test is no t yet approved or cleared by the [place] FDA and  has been authorized for detection and/or diagnosis of SARS-CoV-2 by FDA under an Emergency Use Authorization (EUA). This EUA will remain  in effect (meaning this test can be used) for the duration of the COVID-19 declaration under Section 564(b)(1) of the Act, 21 U.S.C.section 360bbb-3(b)(1), unless the authorization is terminated  or revoked sooner.       Influenza A by PCR NEGATIVE NEGATIVE Final   Influenza B by PCR NEGATIVE NEGATIVE Final    Comment: (NOTE) The Xpert Xpress SARS-CoV-2/FLU/RSV plus assay is intended as an aid in the diagnosis of influenza from Nasopharyngeal swab specimens and should not be used as a sole basis for treatment. Nasal washings and aspirates are unacceptable for Xpert Xpress SARS-CoV-2/FLU/RSV testing.  Fact Sheet for Patients: EntrepreneurPulse.com.au  Fact Sheet for Healthcare Providers: IncredibleEmployment.be  This test is not yet approved or cleared by the [place] FDA and has been authorized for detection and/or diagnosis of SARS-CoV-2 by FDA under an Emergency Use Authorization (EUA). This EUA will remain in effect (meaning this test can be used) for the duration of the COVID-19 declaration under Section 564(b)(1) of the Act, 21 U.S.C. section 360bbb-3(b)(1), unless the authorization is terminated or revoked.  Performed at [facility], [address] [place], [place] [address]     Radiology Reports CT Abdomen  Pelvis Wo Contrast  Addendum Date: 08/26/2020   ADDENDUM REPORT: 08/26/2020 23:20 ADDENDUM: Additional impression point: Multiple bowel containing ventral hernias in a configuration similar to prior without evidence of high-grade obstruction at this time. Electronically Signed   By: Lovena Le M.D.   On: 08/26/2020 23:20   Result Date: 08/26/2020 CLINICAL DATA:  Acute renal failure EXAM: CT ABDOMEN AND PELVIS WITHOUT CONTRAST TECHNIQUE: Multidetector CT imaging of the abdomen and pelvis was performed following the standard protocol without IV contrast. COMPARISON:  CT 08/18/2019 FINDINGS: Lower chest: Trace bilateral effusions. Subpleural reticular changes are similar to comparison prior and may reflect some chronic interstitial change. Cardiomegaly. Hypoattenuation of the cardiac blood pool compatible with anemia. Myocardial calcification at the cardiac apex  may reflect sequela of prior ischemia/infarct. Three-vessel coronary artery atherosclerosis. AICD with leads at the right atrium, coronary sinus and cardiac apex. No pericardial effusion. Hepatobiliary: Coarse, heterogeneous appearance of the liver with markedly nodular surface contour and scattered parenchymal calcifications. No focal renal lesion is seen. Prior cholecystectomy. No discernible biliary ductal dilatation. Pancreas: Pancreatic atrophy. No focal peripancreatic inflammation or ductal dilatation. Vascular calcium along the splenic artery. Spleen: Splenomegaly. Normal in size. No concerning splenic lesions. Adrenals/Urinary Tract: Lobular thickening of the adrenal glands is similar to priors. Stable appearance of a probable cyst in the upper pole right kidney (2/31). Multiple bilateral renal sinus calcifications, possible vascular calcium versus nonobstructive nephrolithiasis. No hydronephrosis or obstructing urolithiasis. Urinary bladder is unremarkable for the degree of distention. Keyhole defect towards the level the prostate may reflect  prior trans urethral resection. Stomach/Bowel: Circumferential thickening of the distal thoracic esophagus and possible thick edematous changes of the underdistended stomach. Duodenum and small bowel appears mildly edematous diffusely without conspicuous distension. Several loops of small bowel protrude into multiple small ventral hernias without evidence of frank obstruction or other concerning acute abnormality of the herniated bowel loops. Diffuse edematous mural thickening throughout the colonic segments, most pronounced of the cecum and transverse colon. No evidence of bowel obstruction. Appendix is seen in the right lower quadrant. No focal periappendiceal inflammation to suggest acute appendicitis. Vascular/Lymphatic: Extensive atherosclerotic calcification throughout the abdominal aorta and branch vessels. Focal ectasia and probable eccentric mural thrombus seen of the infrarenal abdominal aorta with a maximal diameter of up to 3.3 cm, not significantly changed when measured at similar level on comparison prior. Extensive upper abdominal venous collateralization. Incompletely assessed in the absence of contrast media. Few prominent gastrohepatic nodes and porta hepatis lymph nodes are seen. Additional edematous nodes seen in the mid mesentery and retroperitoneum. Reproductive: Coarse calcifications of the prostate appear largely eccentric. Keyhole defect suggesting prior trans urethral resection. Other: Diffuse circumferential body wall edema increasing edematous changes in the mesentery with moderate volume ascites. No free air. No organized abscess or collection. Multiple small broad-based ventral hernia defects with protruding loops of small bowel. No evidence of resulting obstruction at these levels. Additional fat containing right inguinal hernia. Musculoskeletal: The osseous structures appear diffusely demineralized which may limit detection of small or nondisplaced fractures. Scattered  benign-appearing sclerotic bone islands most pronounced in the right ilium. Mild levocurvature lumbar spine. Multilevel degenerative changes are present in the imaged portions of the spine. Stable appearance of a tiny intramuscular probable lipoma of the right iliopsoas near the right ilium. IMPRESSION: 1. Stigmata of cirrhosis including a diminutive, nodular and heterogeneous liver with splenomegaly, upper abdominal venous collateralization and moderate volume ascites and diffuse features of anasarca including circumferential body wall edema and bilateral pleural effusions. Nonspecific gastrohepatic and porta hepatis adenopathy, can be seen in the setting of portal hypertension and cirrhosis though should continue attention on subsequent imaging. 2. Edematous appearance of much of the small bowel and colon, particularly involving the proximal colonic segments, are in a pattern very typical for portal enteropathy/colopathy. Should correlate with clinical findings to exclude a superimposed enterocolitis. 3. Mildly atrophic appearance of the kidneys. No hydronephrosis or obstructive urolithiasis. Scattered nonobstructing nephroliths versus vascular calcium. 4. Cardiomegaly. Calcification of the myometrium at the cardiac apex can reflect sequela of prior infarct. Coronary artery atherosclerosis. AICD. 5.  Aortic Atherosclerosis (ICD10-I70.0). 6. Stable infrarenal abdominal aortic aneurysm measuring up to 3.3 cm with lamellated calcification. Recommend follow-up ultrasound every 3 years. This recommendation follows  ACR consensus guidelines: White Paper of the ACR Incidental Findings Committee II on Vascular Findings. J Am Coll Radiol 2013; 10:789-794. Electronically Signed: By: Lovena Le M.D. On: 08/26/2020 23:10   US RENAL  Result Date: 08/27/2020 CLINICAL DATA:  87 year old male with acute renal failure. EXAM: RENAL / URINARY TRACT ULTRASOUND COMPLETE COMPARISON:  Abdominal ultrasound dated 10/17/2018. CT  abdomen pelvis dated 08/26/2020. FINDINGS: Right Kidney: Renal measurements: 10.6 x 4.6 x 5.4 cm = volume: 138 mL. The kidney is echogenic and mildly atrophic. No hydronephrosis or shadowing stone. Faint small hypoechoic lesions are not characterized. Left Kidney: Renal measurements: 10.5 x 5.0 x 5.6 cm = volume: 154 mL. The kidney is echogenic and mildly atrophic. No hydronephrosis or shadowing stone. Faint small hypoechoic lesions are not characterized. Bladder: Appears normal for degree of bladder distention. Other: There is cirrhosis and small ascites. IMPRESSION: 1. Echogenic and mildly atrophic kidneys consistent with chronic kidney disease. No hydronephrosis. The previously seen stones on the prior CT are not visualized with certainty on this ultrasound. 2. Cirrhosis and ascites. Electronically Signed   By: Anner Crete M.D.   On: 08/27/2020 18:52   US Paracentesis  Result Date: 08/28/2020 INDICATION: Patient with history of dementia, cryptogenic cirrhosis, acute kidney injury, ascites; request received for diagnostic paracentesis. EXAM: ULTRASOUND GUIDED DIAGNOSTIC PARACENTESIS MEDICATIONS: 1% lidocaine to skin and subcutaneous tissue COMPLICATIONS: None immediate. PROCEDURE: Informed written consent was obtained from the patient's family after a discussion of the risks, benefits and alternatives to treatment. A timeout was performed prior to the initiation of the procedure. Initial ultrasound scanning demonstrates a moderate amount of ascites within the left mid to lower abdominal quadrant. The left mid to lower abdomen was prepped and draped in the usual sterile fashion. 1% lidocaine was used for local anesthesia. Following this, a 19 gauge, 10-cm, Yueh catheter was introduced. An ultrasound image was saved for documentation purposes. The paracentesis was performed. The catheter was removed and a dressing was applied. The patient tolerated the procedure well without immediate post procedural  complication. FINDINGS: A total of approximately 150 cc of clear, yellow fluid was removed. Samples were sent to the laboratory as requested by the clinical team. IMPRESSION: Successful ultrasound-guided diagnostic paracentesis yielding 150 cc of peritoneal fluid. Read by: Rowe Robert, PA-C Electronically Signed   By: Sandi Mariscal M.D.   On: 08/28/2020 16:05   CUP PACEART REMOTE DEVICE CHECK  Result Date: 08/20/2020 Scheduled remote reviewed. Normal device function.  No new events. Next remote 91 days.   Lab Data:  CBC: Recent Labs  Lab 08/25/20 1331 08/26/20 2011 08/28/20 0354  WBC 6.3 4.7 5.8  NEUTROABS  --  1.7  --   HGB 9.3* 9.3* 9.0*  HCT 27.5* 28.7* 27.3*  MCV 91 95.3 93.8  PLT Comment 39* 49*   Basic Metabolic Panel: Recent Labs  Lab 08/25/20 1327 08/26/20 2011 08/27/20 0539 08/28/20 0354 08/29/20 0420  NA 144 140 143 142 142  K 4.3 4.6 3.7 4.2 4.1  CL 109* 109 118* 111 110  CO2 19* 23 22 22 24   GLUCOSE 100* 84 69* 77 73  BUN 42* 49* 44* 54* 54*  CREATININE 4.60* 4.69* 3.76* 4.25* 4.18*  CALCIUM 8.2* 9.0 7.3* 8.7* 8.6*   GFR: Estimated Creatinine Clearance: 12.9 mL/min (A) (by C-G formula based on SCr of 4.18 mg/dL (H)). Liver Function Tests: Recent Labs  Lab 08/26/20 2011 08/28/20 0354  AST 25 20  ALT 14 13  ALKPHOS 42 53  BILITOT 1.4* 1.4*  PROT 4.7* 4.5*  ALBUMIN 2.9* 2.8*   No results for input(s): LIPASE, AMYLASE in the last 168 hours. Recent Labs  Lab 08/27/20 1310  AMMONIA 60*   Coagulation Profile: Recent Labs  Lab 08/25/20 1213 08/26/20 2011 08/27/20 1310 08/28/20 0354 08/28/20 1228  INR 4.2* 5.0* 3.8* 3.6* 2.9*   Cardiac Enzymes: No results for input(s): CKTOTAL, CKMB, CKMBINDEX, TROPONINI in the last 168 hours. BNP (last 3 results) Recent Labs    01/22/20 1430 05/06/20 1302  PROBNP 1,003* 975*   HbA1C: No results for input(s): HGBA1C in the last 72 hours. CBG: Recent Labs  Lab 08/27/20 0655  GLUCAP 72   Lipid  Profile: No results for input(s): CHOL, HDL, LDLCALC, TRIG, CHOLHDL, LDLDIRECT in the last 72 hours. Thyroid Function Tests: No results for input(s): TSH, T4TOTAL, FREET4, T3FREE, THYROIDAB in the last 72 hours. Anemia Panel: No results for input(s): VITAMINB12, FOLATE, FERRITIN, TIBC, IRON, RETICCTPCT in the last 72 hours. Urine analysis:    Component Value Date/Time   COLORURINE STRAW (A) 08/27/2020 0040   APPEARANCEUR CLEAR 08/27/2020 0040   APPEARANCEUR Cloudy (A) 09/11/2019 1055   LABSPEC 1.006 08/27/2020 0040   PHURINE 5.0 08/27/2020 0040   GLUCOSEU NEGATIVE 08/27/2020 0040   GLUCOSEU NEGATIVE 11/10/2015 1220   HGBUR NEGATIVE 08/27/2020 0040   BILIRUBINUR NEGATIVE 08/27/2020 0040   BILIRUBINUR Negative 09/11/2019 1055   KETONESUR NEGATIVE 08/27/2020 0040   PROTEINUR NEGATIVE 08/27/2020 0040   UROBILINOGEN 1.0 11/10/2015 1220   NITRITE NEGATIVE 08/27/2020 0040   LEUKOCYTESUR TRACE (A) 08/27/2020 0040     Kilee Hedding M.D. [facility] 08/29/2020, 8:14 AM  Available via Epic secure chat 7am-7pm After 7 pm, please refer to night coverage provider listed on amion.

## 2020-08-30 DIAGNOSIS — K7460 Unspecified cirrhosis of liver: Secondary | ICD-10-CM | POA: Diagnosis not present

## 2020-08-30 DIAGNOSIS — I5042 Chronic combined systolic (congestive) and diastolic (congestive) heart failure: Secondary | ICD-10-CM | POA: Diagnosis not present

## 2020-08-30 DIAGNOSIS — N170 Acute kidney failure with tubular necrosis: Secondary | ICD-10-CM | POA: Diagnosis not present

## 2020-08-30 DIAGNOSIS — N179 Acute kidney failure, unspecified: Secondary | ICD-10-CM | POA: Diagnosis not present

## 2020-08-30 LAB — CBC
HCT: 25.5 % — ABNORMAL LOW (ref 39.0–52.0)
Hemoglobin: 8.2 g/dL — ABNORMAL LOW (ref 13.0–17.0)
MCH: 30.6 pg (ref 26.0–34.0)
MCHC: 32.2 g/dL (ref 30.0–36.0)
MCV: 95.1 fL (ref 80.0–100.0)
Platelets: 37 10*3/uL — ABNORMAL LOW (ref 150–400)
RBC: 2.68 MIL/uL — ABNORMAL LOW (ref 4.22–5.81)
RDW: 15 % (ref 11.5–15.5)
WBC: 3.5 10*3/uL — ABNORMAL LOW (ref 4.0–10.5)
nRBC: 0 % (ref 0.0–0.2)

## 2020-08-30 LAB — BASIC METABOLIC PANEL
Anion gap: 5 (ref 5–15)
BUN: 54 mg/dL — ABNORMAL HIGH (ref 8–23)
CO2: 24 mmol/L (ref 22–32)
Calcium: 8.6 mg/dL — ABNORMAL LOW (ref 8.9–10.3)
Chloride: 114 mmol/L — ABNORMAL HIGH (ref 98–111)
Creatinine, Ser: 3.72 mg/dL — ABNORMAL HIGH (ref 0.61–1.24)
GFR, Estimated: 15 mL/min — ABNORMAL LOW (ref >60)
Glucose, Bld: 73 mg/dL (ref 70–99)
Potassium: 4 mmol/L (ref 3.5–5.1)
Sodium: 143 mmol/L (ref 135–145)

## 2020-08-30 LAB — PROTIME-INR
INR: 1.9 — ABNORMAL HIGH (ref 0.8–1.2)
Prothrombin Time: 21.9 seconds — ABNORMAL HIGH (ref 11.4–15.2)

## 2020-08-30 MED ORDER — DOXYCYCLINE HYCLATE 100 MG PO TABS: 100.0000 mg | ORAL_TABLET | Freq: Two times a day (BID) | ORAL | 0 refills | Status: AC

## 2020-08-30 MED ORDER — MIDODRINE HCL 10 MG PO TABS: 10.0000 mg | ORAL_TABLET | Freq: Three times a day (TID) | ORAL | 2 refills | Status: AC

## 2020-08-30 MED ORDER — WARFARIN - PHARMACIST DOSING INPATIENT: Freq: Every day | Status: DC

## 2020-08-30 MED ORDER — WARFARIN SODIUM 4 MG PO TABS
4.0000 mg | ORAL_TABLET | Freq: Once | ORAL | Status: DC
  Filled 2020-08-30: qty 1

## 2020-08-30 NOTE — Plan of Care (Signed)
  Problem: Education: Goal: Knowledge of General Education information will improve Description: Including pain rating scale, medication(s)/side effects and non-pharmacologic comfort measures 08/30/2020 1112 by Lennie Hummer, RN Outcome: Adequate for Discharge 08/30/2020 1106 by Lennie Hummer, RN Outcome: Progressing   Problem: Health Behavior/Discharge Planning: Goal: Ability to manage health-related needs will improve Outcome: Adequate for Discharge   Problem: Clinical Measurements: Goal: Ability to maintain clinical measurements within normal limits will improve Outcome: Adequate for Discharge Goal: Will remain free from infection 08/30/2020 1112 by Lennie Hummer, RN Outcome: Adequate for Discharge 08/30/2020 1106 by Lennie Hummer, RN Outcome: Progressing Goal: Diagnostic test results will improve Outcome: Adequate for Discharge Goal: Respiratory complications will improve Outcome: Adequate for Discharge Goal: Cardiovascular complication will be avoided Outcome: Adequate for Discharge   Problem: Activity: Goal: Risk for activity intolerance will decrease Outcome: Adequate for Discharge   Problem: Nutrition: Goal: Adequate nutrition will be maintained Outcome: Adequate for Discharge   Problem: Coping: Goal: Level of anxiety will decrease 08/30/2020 1112 by Lennie Hummer, RN Outcome: Adequate for Discharge 08/30/2020 1106 by Lennie Hummer, RN Outcome: Progressing   Problem: Elimination: Goal: Will not experience complications related to bowel motility Outcome: Adequate for Discharge Goal: Will not experience complications related to urinary retention Outcome: Adequate for Discharge   Problem: Pain Managment: Goal: General experience of comfort will improve Outcome: Adequate for Discharge   Problem: Safety: Goal: Ability to remain free from injury will improve Outcome: Adequate for Discharge   Problem: Skin Integrity: Goal: Risk for impaired skin  integrity will decrease Outcome: Adequate for Discharge

## 2020-08-30 NOTE — Plan of Care (Signed)
  Problem: Education: Goal: Knowledge of General Education information will improve Description: Including pain rating scale, medication(s)/side effects and non-pharmacologic comfort measures Outcome: Progressing   Problem: Clinical Measurements: Goal: Will remain free from infection Outcome: Progressing   Problem: Coping: Goal: Level of anxiety will decrease Outcome: Progressing

## 2020-08-30 NOTE — Progress Notes (Signed)
ANTICOAGULATION CONSULT NOTE - Initial Consult  Pharmacy Consult for warfarin Indication:  mesenteric thrombus  No Known Allergies  Patient Measurements: Height: 5' 10"  (177.8 cm) Weight: 78.5 kg (173 lb 1 oz) IBW/kg (Calculated) : 73   Vital Signs: Temp: 98.4 F (36.9 C) (07/31 0636) BP: 102/56 (07/31 0636) Pulse Rate: 60 (07/31 0636)  Labs: Recent Labs    08/28/20 0354 08/28/20 1228 08/29/20 0420 08/30/20 0407  HGB 9.0*  --   --  8.2*  HCT 27.3*  --   --  25.5*  PLT 49*  --   --  37*  LABPROT 36.1* 30.6*  --  21.9*  INR 3.6* 2.9*  --  1.9*  CREATININE 4.25*  --  4.18* 3.72*    Estimated Creatinine Clearance: 14.4 mL/min (A) (by C-G formula based on SCr of 3.72 mg/dL (H)).   Medical History: Past Medical History:  Diagnosis Date   AAA (abdominal aortic aneurysm) ([place])    Abdominal pain 04/14/2011   Acute bronchitis 09/02/2013   Acute vascular insufficiency of intestine (HCC)    Mesenteric embolus...surgery 2003   Allergic rhinitis 09/09/2013   Anxiety    Aortic atherosclerosis ([place]) 11/10/2015   Atrial fibrillation (HCC)    paroxysmal   Back pain 08/08/2016   Biventricular implantable cardiac defibrillator in situ    GDT CONTAK H170-MADIT-CRT-EXPLANTED 2011 implanted defibrillator- Guidant cognis, model n119-2001 Dr. Caryl Comes (11/28/2009)   Breast tenderness    Spironolactone was stopped and eplerinone started. Patient feels better   CAD (coronary artery disease)    CABG 1994  /   catheterization 1996, occluded vein graft to the diagonal, other grafts patent  /   catheterization 2006, no PCI  /  nuclear, October, 2011, extensive scar anteroseptal and apical, no ischemia   Carotid artery disease ([place])    Doppler, February,  2012, 0-39% bilateral, recommended followup 1 year   Chronic systolic heart failure ([place])    chronic systolic    Chronotropic incompetence    Cirrhosis, nonalcoholic ([place]) 7/40/8144   Colonic polyp 2010   pathology not clear.    Crohn's  disease ([place])    Degenerative joint disease    Dermatitis 04/05/2016   Dizziness    Positional dizziness   Edema leg    Venous Dopplers 8/13: No DVT bilaterally   Ejection fraction    EF 35%, echo, November, 2011  //   Is 35-40%, echo,  September 23, 2011    Fatigue 06/25/2008   Qualifier: Diagnosis of  By: [name], MD, [facility], Dorinda Hill    GERD 02/21/2007   Qualifier: Diagnosis of  By: Ronnald Ramp CNA/MA, Jessica     Hyperglycemia 08/08/2016   Hyperlipemia    Hypertension    Hypertrophy of prostate with urinary obstruction and other lower urinary tract symptoms (LUTS)    Hypoalbuminemia 06/16/2013   Hypokalemia 06/18/2013   Implantable cardioverter-defibrillator (ICD) in situ 12/09/2009   Annotation: EXPLANTED 2011   //   New Guidant ICD implanted 2011      Iron deficiency anemia    Ischemic cardiomyopathy    CABG 1994 CAD catherization 1996.[name] occluded vein graft to the diagnol, other grafts patent/  catherization 2006, no PCI/ nuclear.[name] october 2011.[name] extensive scar anteroseptal and apical.. 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 november 2009/ ef 35%.[name] echo november  2011   Oral thrush 02/05/2015   OSA (obstructive sleep apnea) 09/16/2011   NPSG 2013:  AHI 34/hr.     Polymyalgia rheumatica (HCC)    Shingles    Sinus bradycardia    Skin cancer 08/08/2016   Thrombocytopenia ([place]) 2008   Thrush    Tremor    Ventral hernia    multiple, wears an abd binder, reports he hs been told he was at increased riisk for surgery   Vitamin B12 deficiency    Vitamin D deficiency 11/06/2008   Qualifier: Diagnosis of  By: Lenna Gilford MD, Deborra Medina    Warfarin anticoagulation    Coumadin therapy for mesenteric embolus 2003    Medications:  Medications Prior to Admission  Medication Sig Dispense Refill Last Dose   acetaminophen (TYLENOL) 650 MG CR tablet  Take 650 mg by mouth daily as needed for pain.   Past Month   albuterol (VENTOLIN HFA) 108 (90 Base) MCG/ACT inhaler Inhale 2 puffs into the lungs every 4 (four) hours as needed for wheezing or shortness of breath. 18 g 1 Past Month   Carboxymethylcellulose Sodium (ARTIFICIAL TEARS OP) Place 1 drop into both eyes 3 (three) times daily as needed (dry eyes).   08/26/2020   carvedilol (COREG) 6.25 MG tablet TAKE 1 TABLET BY MOUTH TWICE DAILY WITH A MEAL (Patient taking differently: Take 6.25 mg by mouth 2 (two) times daily with a meal.) 180 tablet 0 08/26/2020 at 10 am   cholecalciferol (VITAMIN D) 1000 UNITS tablet Take 1,000 Units by mouth every evening.   08/26/2020   donepezil (ARICEPT) 10 MG tablet Take 1 tablet (10 mg total) by mouth at bedtime. 90 tablet 3 08/25/2020   Ensure Plus (ENSURE PLUS) LIQD Take 237 mLs by mouth See admin instructions. Takes 237 mls three times a week   08/25/2020   fluticasone (FLOVENT HFA) 220 MCG/ACT inhaler Inhale 1 puff into the lungs in the morning and at bedtime. 1 each 1 Past Month   hydrocortisone cream 1 % Apply 1 application topically daily as needed for itching.   08/25/2020   levocetirizine (XYZAL) 5 MG tablet TAKE 1 TABLET BY MOUTH ONCE DAILY IN THE EVENING 30 tablet 0 08/25/2020   Loperamide HCl (LOPERAMIDE A-D PO) Take 1 tablet by mouth 2 (two) times daily as needed (Diarrhea).   08/25/2020   magnesium gluconate (MAGONATE) 500 MG tablet Take 500 mg by mouth daily as needed (cramps).   Past Week   memantine (NAMENDA) 10 MG tablet Take 1 tablet by mouth twice daily 60 tablet 5 08/26/2020   Menthol, Topical Analgesic, (BIOFREEZE EX) Apply 1 application topically daily as needed (pain).   08/25/2020   pantoprazole (PROTONIX) 40 MG tablet TAKE 1 TABLET BY MOUTH ONCE DAILY AT NOON 90 tablet 3 08/25/2020   potassium chloride SA (KLOR-CON) 20 MEQ tablet Take 1 tablet (20 mEq total) by mouth daily. 90 tablet 2 08/26/2020   Simethicone (GAS-X MAXIMUM STRENGTH PO) Take 1-2  tablets by mouth daily as needed (Flatulence).   08/25/2020   simvastatin (ZOCOR) 20 MG tablet TAKE 1 TABLET BY MOUTH AT BEDTIME (Patient taking differently: Take 20 mg by mouth daily at 6 PM.) 90 tablet 0 08/25/2020   vitamin B-12 (CYANOCOBALAMIN) 1000 MCG tablet Take 500 mcg by mouth daily.    08/26/2020   warfarin (COUMADIN) 5 MG tablet TAKE 1/2-1 TABLET DAILY  AS DIRECTED BY THE COUMADIN CLINIC 90 tablet 0 08/24/2020 at 8.30 pm   cholestyramine (QUESTRAN) 4 g packet  Take 1 packet (4 g total) by mouth daily. (Patient not taking: Reported on 08/27/2020) 30 each 3 Not Taking   lisinopril (ZESTRIL) 2.5 MG tablet Take 1 tablet (2.5 mg total) by mouth daily. (Patient not taking: No sig reported) 90 tablet 3 Not Taking   torsemide (DEMADEX) 20 MG tablet Alternate between 555m (2 -tablets) and 880m(4 tablets) daily (Patient not taking: No sig reported) 270 tablet 1 Not Taking   Scheduled:   donepezil  10 mg Oral QHS   doxycycline  100 mg Oral Q12H   feeding supplement  237 mL Oral BID BM   memantine  10 mg Oral BID   midodrine  10 mg Oral TID WC   vitamin B-12  500 mcg Oral Daily   PRN: acetaminophen, albuterol, haloperidol lactate, ondansetron (ZOFRAN) IV, oxyCODONE  Assessment: [age]o male with AKI (SCr slowly improving) on warfarin PTA for mesenteric thrombus at a dose that was planned to be 2.55m62maily M-F per anticoag clinic notes. INR just below goal range this AM at 1.9. Plts 37 but chronically low. To restart warfarin today per Md orders after INR was reversed on 7/29 with 55mg33mtamin K.   Goal of Therapy:  INR 2-3    Plan:  Warfarin 4mg 86may x 1 Daily INR  LeggeKara Mead/2022,7:40 AM

## 2020-08-30 NOTE — Discharge Summary (Signed)
Physician Discharge Summary   Patient ID: [name] MRN: [id number] DOB/AGE: 1931-07-08 87 y.o.  Admit date: 08/26/2020 Discharge date: 08/30/2020  Primary Care Physician:  [name], MD   Recommendations for Outpatient Follow-up:  Follow up with PCP in 1-2 weeks Please obtain BMP in one week   Home Health: Hospice services established Equipment/Devices: DME hospital bed  Discharge Condition: Guarded, overall poor prognosis CODE STATUS: DNR Diet recommendation: Regular diet   Discharge Diagnoses:      History of cryptogenic cirrhosis,  Hypotension  Acute renal failure on CKD stage IIIb  Supratherapeutic INR  History of combined systolic and diastolic CHF, ischemic cardiomyopathy, volume overload  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 Silverio Decamp Nephrology, Dr. Jonnie Finner Palliative medicine, Dr. Hilma Favors  Allergies:  No Known Allergies   DISCHARGE MEDICATIONS: Allergies as of 08/30/2020   No Known Allergies      Medication List     STOP taking these medications    carvedilol 6.25 MG tablet Commonly known as: COREG   cholestyramine 4 g packet Commonly known as: Questran   lisinopril 2.5 MG tablet Commonly known as: ZESTRIL   potassium chloride SA 20 MEQ tablet Commonly known as: KLOR-CON   simvastatin 20 MG tablet Commonly known as: ZOCOR   torsemide 20 MG tablet Commonly known as: DEMADEX       TAKE these medications    acetaminophen 650 MG CR tablet Commonly known as: TYLENOL Take 650 mg by mouth daily as needed for pain.   albuterol 108 (90 Base) MCG/ACT inhaler Commonly known as: VENTOLIN HFA Inhale 2 puffs into the lungs every 4 (four) hours as needed for wheezing or  shortness of breath.   ARTIFICIAL TEARS OP Place 1 drop into both eyes 3 (three) times daily as needed (dry eyes).   BIOFREEZE EX Apply 1 application topically daily as needed (pain).   cholecalciferol 1000 units tablet Commonly known as: VITAMIN D Take 1,000 Units by mouth every evening.   donepezil 10 MG tablet Commonly known as: ARICEPT Take 1 tablet (10 mg total) by mouth at bedtime.   doxycycline 100 MG tablet Commonly known as: VIBRA-TABS Take 1 tablet (100 mg total) by mouth 2 (two) times daily for 7 days.   Ensure Plus Liqd Take 237 mLs by mouth See admin instructions. Takes 237 mls three times a week   Flovent HFA 220 MCG/ACT inhaler Generic drug: fluticasone Inhale 1 puff into the lungs in the morning and at bedtime.   GAS-X MAXIMUM STRENGTH PO Take 1-2 tablets by mouth daily as needed (Flatulence).   hydrocortisone cream 1 % Apply 1 application topically daily as needed for itching.   levocetirizine 5 MG tablet Commonly known as: XYZAL TAKE 1 TABLET BY MOUTH ONCE DAILY IN THE EVENING   LOPERAMIDE A-D PO Take 1 tablet by mouth 2 (two) times daily as needed (Diarrhea).   magnesium gluconate 500 MG tablet Commonly known as: MAGONATE Take 500 mg by mouth daily as needed (cramps).   memantine 10 MG tablet Commonly known as: NAMENDA Take 1 tablet by mouth twice daily   midodrine 10 MG tablet Commonly known as: PROAMATINE Take 1 tablet (10 mg total) by mouth 3 (three) times daily with meals.   pantoprazole 40 MG tablet Commonly known as:  PROTONIX TAKE 1 TABLET BY MOUTH ONCE DAILY AT NOON What changed:  how much to take how to take this when to take this additional instructions   vitamin B-12 1000 MCG tablet Commonly known as: CYANOCOBALAMIN Take 500 mcg by mouth daily.   warfarin 5 MG tablet Commonly known as: COUMADIN Take as directed. If you are unsure how to take this medication, talk to your nurse or doctor. Original instructions: TAKE 1/2-1  TABLET DAILY  AS DIRECTED BY THE COUMADIN CLINIC What changed:  how much to take how to take this when to take this additional instructions         Brief H and P: For complete details please refer to admission H and P, but in brief,  Patient is a 87 year old male with dementia, ischemic cardiomyopathy s/p CABG, combined systolic and diastolic CHF, history of bradycardia s/p AICD mesenteric thrombus on Coumadin, nonalcoholic cirrhosis, hypertension, Crohn's disease, OSA, BPH, hyperlipidemia presented with acute kidney injury.  Wife at the bedside and provided history as patient is voluntarily non verbal.  Wife had reported that patient was feeling unwell for past 3 weeks, talking less and mostly just sitting at home.  Also reported significant bilateral lower extremity edema for the past 3 weeks, worsening appetite, poor oral intake however continue to drink plenty of fluids. Patient had seen EP cardiology on 7/25 and was recommended to go to ED due to worsening renal function, torsemide was increased during the visit.  Lisinopril was decreased.  Also reported diarrhea. Also noted to have supratherapeutic INR, last dose of Coumadin on 7/25 In ED, patient was hypotensive with systolic in 61W, bradycardiac with heart rate 159, hemoglobin 9.3, platelets 39, chronic Creatinine 4.69, INR 5 CT abdomen showed stigmata of cirrhosis with splenomegaly, moderate ascites, anasarca including circumferential body wall edema and bilateral pleural effusions.  Edematous appearance of much of the small bowel and colon, typical for portal enteropathy/colopathy.    Hospital Course:  History of cryptogenic cirrhosis, nonalcoholic ([place]) -Patient was noted to have significant bilateral lower extremity edema and diffuse anasarca, poor appetite  -Gastroenterology was consulted ammonia level 60, no lactulose at this time.  Per GI, less likely to be hepatorenal syndrome, renal function likely worsened due to  intravascular volume depletion from poor p.o. intake, lisinopril and torsemide which was recently increased..   -Underwent diagnostic paracentesis, labs still not resulted -Received IV albumin, started on midodrine due to hypotension.     Hypotension -Hold antihypertensives, torsemide, beta-blocker, lisinopril -Received IV albumin.   Acute renal failure on CKD stage IIIb -Likely due to increased diuretic dose, poor p.o. intake, hypotension.  There was a concern for hepatorenal syndrome, received IV albumin.  No urinary retention. -Presented with creatinine of 4.69, was 1.8 on 05/06/2020 -Renal ultrasound showed no hydronephrosis or obstruction, showed CKD -Nephrology was consulted.  Received IV albumin on 7/29, trial of IV fluids, creatinine improved to 3.7 at the time of discharge. -Continue to hold torsemide, lisinopril, avoid hypotension.  Continue midodrine. -Palliative medicine was consulted for goals of care.  Dr. Hilma Favors discussed in detail with the patient's family, at this time DNR/DNI and patient's family wishes to take him home with hospice.  Hospice was arranged by the case management.   Supratherapeutic INR, history of mesenteric thrombosis on Coumadin - INR 5.0 on admission, Coumadin was held -Did receive vitamin K on 7/29 for diagnostic paracentesis -INR 1.9.  Discussed with patient's family, his wife's wish to resume Coumadin.   History of combined systolic  and diastolic CHF, ischemic cardiomyopathy s/p CABG with volume overload, anasarca -Currently torsemide, lisinopril, Coreg on hold due to hypotension -Due to acute kidney injury, hypotension, unable to aggressively diurese   Tick bite: -Patient noted to have tick attached to his right upper chest, unknown duration.  Patient loves gardening. -On exam has a red erythematous spot at the tick bite site.   -Continue doxycycline   History of bradycardia as s/p ICD -Currently no acute issues   Chronic  thrombocytopenia -Has chronic thrombocytopenia, baseline in 50's   Infrarenal AAA -Incidental finding on CT 3.3 cm -Ultrasound 3 years follow-up recommended   Dementia -Continue Aricept   History of Crohn's disease of small bowel status postresection -Per GI, not a candidate for outpatient colonoscopy with his age and comorbidities, can take Imodium as needed    Day of Discharge S: Creatinine improved to 3.7 today.  Alert and awake, denies any specific complaints, son at the bedside.  No acute issues overnight.  No fevers or chills.  BP (!) 102/56 (BP Location: Left Arm)   Pulse 60   Temp 98.4 F (36.9 C)   Resp 18   Ht 5' 10"  (1.778 m)   Wt 78.5 kg   SpO2 94%   BMI 24.83 kg/m   Physical Exam: General: Alert, not in any acute distress.  Frail CVS: S1-S2 clear no murmur rubs or gallops Chest: Diminished breath sound at the bases, no wheezing Abdomen: soft nontender, nondistended, normal bowel sounds Extremities: 1-2+ pitting edema   Get Medicines reviewed and adjusted: Please take all your medications with you for your next visit with your Primary MD  Please request your Primary MD to go over all hospital tests and procedure/radiological results at the follow up. Please ask your Primary MD to get all Hospital records sent to his/her office.  If you experience worsening of your admission symptoms, develop shortness of breath, life threatening emergency, suicidal or homicidal thoughts you must seek medical attention immediately by calling 911 or calling your MD immediately  if symptoms less severe.  You must read complete instructions/literature along with all the possible adverse reactions/side effects for all the Medicines you take and that have been prescribed to you. Take any new Medicines after you have completely understood and accept all the possible adverse reactions/side effects.   Do not drive when taking pain medications.   Do not take more than prescribed Pain,  Sleep and Anxiety Medications  Special Instructions: If you have smoked or chewed Tobacco  in the last 2 yrs please stop smoking, stop any regular Alcohol  and or any Recreational drug use.  Wear Seat belts while driving.  Please note  You were cared for by a hospitalist during your hospital stay. Once you are discharged, your primary care physician will handle any further medical issues. Please note that NO REFILLS for any discharge medications will be authorized once you are discharged, as it is imperative that you return to your primary care physician (or establish a relationship with a primary care physician if you do not have one) for your aftercare needs so that they can reassess your need for medications and monitor your lab values.   The results of significant diagnostics from this hospitalization (including imaging, microbiology, ancillary and laboratory) are listed below for reference.      Procedures/Studies:  CT Abdomen Pelvis Wo Contrast  Addendum Date: 08/26/2020   ADDENDUM REPORT: 08/26/2020 23:20 ADDENDUM: Additional impression point: Multiple bowel containing ventral hernias in a configuration  similar to prior without evidence of high-grade obstruction at this time. Electronically Signed   By: Lovena Le M.D.   On: 08/26/2020 23:20   Result Date: 08/26/2020 CLINICAL DATA:  Acute renal failure EXAM: CT ABDOMEN AND PELVIS WITHOUT CONTRAST TECHNIQUE: Multidetector CT imaging of the abdomen and pelvis was performed following the standard protocol without IV contrast. COMPARISON:  CT 08/18/2019 FINDINGS: Lower chest: Trace bilateral effusions. Subpleural reticular changes are similar to comparison prior and may reflect some chronic interstitial change. Cardiomegaly. Hypoattenuation of the cardiac blood pool compatible with anemia. Myocardial calcification at the cardiac apex may reflect sequela of prior ischemia/infarct. Three-vessel coronary artery atherosclerosis. AICD with leads  at the right atrium, coronary sinus and cardiac apex. No pericardial effusion. Hepatobiliary: Coarse, heterogeneous appearance of the liver with markedly nodular surface contour and scattered parenchymal calcifications. No focal renal lesion is seen. Prior cholecystectomy. No discernible biliary ductal dilatation. Pancreas: Pancreatic atrophy. No focal peripancreatic inflammation or ductal dilatation. Vascular calcium along the splenic artery. Spleen: Splenomegaly. Normal in size. No concerning splenic lesions. Adrenals/Urinary Tract: Lobular thickening of the adrenal glands is similar to priors. Stable appearance of a probable cyst in the upper pole right kidney (2/31). Multiple bilateral renal sinus calcifications, possible vascular calcium versus nonobstructive nephrolithiasis. No hydronephrosis or obstructing urolithiasis. Urinary bladder is unremarkable for the degree of distention. Keyhole defect towards the level the prostate may reflect prior trans urethral resection. Stomach/Bowel: Circumferential thickening of the distal thoracic esophagus and possible thick edematous changes of the underdistended stomach. Duodenum and small bowel appears mildly edematous diffusely without conspicuous distension. Several loops of small bowel protrude into multiple small ventral hernias without evidence of frank obstruction or other concerning acute abnormality of the herniated bowel loops. Diffuse edematous mural thickening throughout the colonic segments, most pronounced of the cecum and transverse colon. No evidence of bowel obstruction. Appendix is seen in the right lower quadrant. No focal periappendiceal inflammation to suggest acute appendicitis. Vascular/Lymphatic: Extensive atherosclerotic calcification throughout the abdominal aorta and branch vessels. Focal ectasia and probable eccentric mural thrombus seen of the infrarenal abdominal aorta with a maximal diameter of up to 3.3 cm, not significantly changed when  measured at similar level on comparison prior. Extensive upper abdominal venous collateralization. Incompletely assessed in the absence of contrast media. Few prominent gastrohepatic nodes and porta hepatis lymph nodes are seen. Additional edematous nodes seen in the mid mesentery and retroperitoneum. Reproductive: Coarse calcifications of the prostate appear largely eccentric. Keyhole defect suggesting prior trans urethral resection. Other: Diffuse circumferential body wall edema increasing edematous changes in the mesentery with moderate volume ascites. No free air. No organized abscess or collection. Multiple small broad-based ventral hernia defects with protruding loops of small bowel. No evidence of resulting obstruction at these levels. Additional fat containing right inguinal hernia. Musculoskeletal: The osseous structures appear diffusely demineralized which may limit detection of small or nondisplaced fractures. Scattered benign-appearing sclerotic bone islands most pronounced in the right ilium. Mild levocurvature lumbar spine. Multilevel degenerative changes are present in the imaged portions of the spine. Stable appearance of a tiny intramuscular probable lipoma of the right iliopsoas near the right ilium. IMPRESSION: 1. Stigmata of cirrhosis including a diminutive, nodular and heterogeneous liver with splenomegaly, upper abdominal venous collateralization and moderate volume ascites and diffuse features of anasarca including circumferential body wall edema and bilateral pleural effusions. Nonspecific gastrohepatic and porta hepatis adenopathy, can be seen in the setting of portal hypertension and cirrhosis though should continue attention on subsequent imaging.  2. Edematous appearance of much of the small bowel and colon, particularly involving the proximal colonic segments, are in a pattern very typical for portal enteropathy/colopathy. Should correlate with clinical findings to exclude a superimposed  enterocolitis. 3. Mildly atrophic appearance of the kidneys. No hydronephrosis or obstructive urolithiasis. Scattered nonobstructing nephroliths versus vascular calcium. 4. Cardiomegaly. Calcification of the myometrium at the cardiac apex can reflect sequela of prior infarct. Coronary artery atherosclerosis. AICD. 5.  Aortic Atherosclerosis (ICD10-I70.0). 6. Stable infrarenal abdominal aortic aneurysm measuring up to 3.3 cm with lamellated calcification. Recommend follow-up ultrasound every 3 years. This recommendation follows ACR consensus guidelines: White Paper of the ACR Incidental Findings Committee II on Vascular Findings. J Am Coll Radiol 2013; 10:789-794. Electronically Signed: By: Lovena Le M.D. On: 08/26/2020 23:10   US RENAL  Result Date: 08/27/2020 CLINICAL DATA:  87 year old male with acute renal failure. EXAM: RENAL / URINARY TRACT ULTRASOUND COMPLETE COMPARISON:  Abdominal ultrasound dated 10/17/2018. CT abdomen pelvis dated 08/26/2020. FINDINGS: Right Kidney: Renal measurements: 10.6 x 4.6 x 5.4 cm = volume: 138 mL. The kidney is echogenic and mildly atrophic. No hydronephrosis or shadowing stone. Faint small hypoechoic lesions are not characterized. Left Kidney: Renal measurements: 10.5 x 5.0 x 5.6 cm = volume: 154 mL. The kidney is echogenic and mildly atrophic. No hydronephrosis or shadowing stone. Faint small hypoechoic lesions are not characterized. Bladder: Appears normal for degree of bladder distention. Other: There is cirrhosis and small ascites. IMPRESSION: 1. Echogenic and mildly atrophic kidneys consistent with chronic kidney disease. No hydronephrosis. The previously seen stones on the prior CT are not visualized with certainty on this ultrasound. 2. Cirrhosis and ascites. Electronically Signed   By: Anner Crete M.D.   On: 08/27/2020 18:52   US Paracentesis  Result Date: 08/28/2020 INDICATION: Patient with history of dementia, cryptogenic cirrhosis, acute kidney injury,  ascites; request received for diagnostic paracentesis. EXAM: ULTRASOUND GUIDED DIAGNOSTIC PARACENTESIS MEDICATIONS: 1% lidocaine to skin and subcutaneous tissue COMPLICATIONS: None immediate. PROCEDURE: Informed written consent was obtained from the patient's family after a discussion of the risks, benefits and alternatives to treatment. A timeout was performed prior to the initiation of the procedure. Initial ultrasound scanning demonstrates a moderate amount of ascites within the left mid to lower abdominal quadrant. The left mid to lower abdomen was prepped and draped in the usual sterile fashion. 1% lidocaine was used for local anesthesia. Following this, a 19 gauge, 10-cm, Yueh catheter was introduced. An ultrasound image was saved for documentation purposes. The paracentesis was performed. The catheter was removed and a dressing was applied. The patient tolerated the procedure well without immediate post procedural complication. FINDINGS: A total of approximately 150 cc of clear, yellow fluid was removed. Samples were sent to the laboratory as requested by the clinical team. IMPRESSION: Successful ultrasound-guided diagnostic paracentesis yielding 150 cc of peritoneal fluid. Read by: Rowe Robert, PA-C Electronically Signed   By: Sandi Mariscal M.D.   On: 08/28/2020 16:05   CUP PACEART REMOTE DEVICE CHECK  Result Date: 08/20/2020 Scheduled remote reviewed. Normal device function.  No new events. Next remote 91 days.     LAB RESULTS: Basic Metabolic Panel: Recent Labs  Lab 08/29/20 0420 08/30/20 0407  NA 142 143  K 4.1 4.0  CL 110 114*  CO2 24 24  GLUCOSE 73 73  BUN 54* 54*  CREATININE 4.18* 3.72*  CALCIUM 8.6* 8.6*   Liver Function Tests: Recent Labs  Lab 08/26/20 2011 08/28/20 0354  AST 25 20  ALT 14 13  ALKPHOS 42 53  BILITOT 1.4* 1.4*  PROT 4.7* 4.5*  ALBUMIN 2.9* 2.8*   No results for input(s): LIPASE, AMYLASE in the last 168 hours. Recent Labs  Lab 08/27/20 1310   AMMONIA 60*   CBC: Recent Labs  Lab 08/26/20 2011 08/28/20 0354 08/30/20 0407  WBC 4.7 5.8 3.5*  NEUTROABS 1.7  --   --   HGB 9.3* 9.0* 8.2*  HCT 28.7* 27.3* 25.5*  MCV 95.3 93.8 95.1  PLT 39* 49* 37*   Cardiac Enzymes: No results for input(s): CKTOTAL, CKMB, CKMBINDEX, TROPONINI in the last 168 hours. BNP: Invalid input(s): POCBNP CBG: Recent Labs  Lab 08/27/20 0655  GLUCAP 72       Disposition and Follow-up: Discharge Instructions     Diet - low sodium heart healthy   Complete by: As directed    Increase activity slowly   Complete by: As directed         DISPOSITION: Home with hospice support   [facility]     [name], MD. Schedule an appointment as soon as possible for a visit in 2 week(s).   Specialty: Family Medicine Contact information: [address] [place] [address] [phone]         Sherren Mocha, MD .   Specialty: Cardiology Contact information: [address] [place] [address] [phone]         Deboraha Sprang, MD .   Specialty: Cardiology Contact information: [phone] [address] [place] [address] [phone]                  Time coordinating discharge:  35 minutes  Signed:   Estill Cotta M.D. [facility] 08/30/2020, 10:26 AM

## 2020-08-31 ENCOUNTER — Telehealth: Payer: Self-pay | Admitting: *Deleted

## 2020-08-31 ENCOUNTER — Telehealth: Payer: Self-pay

## 2020-08-31 NOTE — Telephone Encounter (Signed)
Pt's wife called last week re husband stating needs hospital f/u with Dr Burt Knack  Pt has appt with Ermalinda Barrios PA on 09/16/20 at 12:15 pm and this is the earliest appt available Instructed to get a hold of kidney MD Per wife pt does not have kidney MD Instructed to call PCP pt's wife attempted to call PCP but could not get call to go through will try again .Adonis Housekeeper

## 2020-08-31 NOTE — Telephone Encounter (Signed)
Spoke with pt's wife, DPRwho states pt is supposed to be enrolled with Hospice services this week.  Pt's wife states hospital states pt can have a low dose diuretic to help with edema but discharge instructions advise pt to stop Torsemide and does not mention this.  Advised pt is to follow up with PCP in 1-2 weeks.  Pt's wife states she did try to call PCP office earlier for an appointment but was unable to reach anyone and did not try to call back.  Advised Dr Caryl Comes is out of the office this week but will forward information for his review.  Pt's wife will attempt follow up with PCP office again this afternoon or tomorrow.  Pt's wife verbalizes understanding and agrees with current plan.

## 2020-08-31 NOTE — Telephone Encounter (Signed)
Attempted phone call to pt's wife.  No answer and no voicemail.  Will attempt to contact again at a later time.

## 2020-08-31 NOTE — Telephone Encounter (Signed)
The patient wife states he is about to be put in hospice and would like for Dr. Caryl Comes advice about if ICD therapies should be turned off. She would like for him to call her today or tomorrow.

## 2020-09-01 ENCOUNTER — Telehealth: Payer: Self-pay

## 2020-09-01 LAB — CYTOLOGY - NON PAP

## 2020-09-01 NOTE — Telephone Encounter (Signed)
Pts wife called stating that her husband needs a hospital follow up. She also says he needs a mild Diuretic. Is there a specific appointment slot you would like to use?

## 2020-09-01 NOTE — Telephone Encounter (Signed)
Dr. Charlett Blake is ok to use a 1pm or 4 pm for this pt

## 2020-09-01 NOTE — Telephone Encounter (Signed)
Called and spoke to Mrs [place].   Q1  is it time to inactivate the ICD.  With acutely worsening renal failure, his prognosis is clearly worsening and I suggested ( to her agreement) that ICD therapy would not change the trajectory of illness and thus would cause pain without the hope of significant life prolongation>> she would like to inactivate it but one of her daughters has concerns.  I have offered to call her  Q2   hospice in home as been delayed and the family ( same dynamic) is unsure of CPR.  I have asserted the same thing, that with his deteriorating condition that CPR would have only a minimal effect on his life trajectory and could cause significant untowards with broken ribs ventiraltion and not dying at home. More to follow

## 2020-09-01 NOTE — Telephone Encounter (Cosign Needed)
Transition Care Management Follow-up Telephone Call Date of discharge and from where: [name] 08/30/20 How have you been since you were released from the hospital? resting Any questions or concerns? No  Items Reviewed: Did the pt receive and understand the discharge instructions provided? Yes  Medications obtained and verified? Yes  Other? No  Any new allergies since your discharge? No  Dietary orders reviewed? No Do you have support at home? Yes   Home Care and Equipment/Supplies: Were home health services ordered? yes If so, what is the name of the agency?   Has the agency set up a time to come to the patient's home? no Were any new equipment or medical supplies ordered?  Yes: DME bed  What is the name of the medical supply agency?  Were you able to get the supplies/equipment? Pt wife stated they are going to hold off on services at this time Do you have any questions related to the use of the equipment or supplies?   Functional Questionnaire: (I = Independent and D = Dependent) ADLs: D  Bathing/Dressing- D  Meal Prep- D  Eating- D  Maintaining continence- D  Transferring/Ambulation- D  Managing Meds- D  Follow up appointments reviewed:  PCP Hospital f/u appt confirmed? No  Working on getting an appt with [facility] f/u appt confirmed? Yes  Scheduled to see Cardiology, 09/16/20  Are transportation arrangements needed? No  If their condition worsens, is the pt aware to call PCP or go to the Emergency Dept.? Yes Was the patient provided with contact information for the PCP's office or ED? Yes Was to pt encouraged to call back with questions or concerns? Yes

## 2020-09-02 NOTE — Telephone Encounter (Signed)
Patient's wife states he needed appt ASAP. He was scheduled with Mickel Baas for Thursday

## 2020-09-03 ENCOUNTER — Other Ambulatory Visit: Payer: Self-pay

## 2020-09-03 ENCOUNTER — Ambulatory Visit (INDEPENDENT_AMBULATORY_CARE_PROVIDER_SITE_OTHER): Payer: Medicare HMO | Admitting: Family

## 2020-09-03 ENCOUNTER — Encounter: Payer: Self-pay | Admitting: Family

## 2020-09-03 ENCOUNTER — Other Ambulatory Visit: Payer: Self-pay | Admitting: Family Medicine

## 2020-09-03 VITALS — BP 100/60 | HR 60 | Temp 97.5°F | Ht 72.0 in | Wt 173.0 lb

## 2020-09-03 DIAGNOSIS — K7460 Unspecified cirrhosis of liver: Secondary | ICD-10-CM | POA: Diagnosis not present

## 2020-09-03 DIAGNOSIS — N179 Acute kidney failure, unspecified: Secondary | ICD-10-CM

## 2020-09-03 NOTE — Progress Notes (Signed)
[name] is a 87 y.o. male with the following history as recorded in EpicCare:  Patient Active Problem List   Diagnosis Date Noted   Liver failure ([place]) 08/27/2020   Hypotension 08/27/2020   Supratherapeutic INR 08/27/2020   Chronic combined systolic and diastolic CHF (congestive heart failure) ([place]) 08/27/2020   Aneurysm of infrarenal abdominal aorta ([place]) 08/27/2020   Viral illness 02/16/2020   Acute renal failure superimposed on stage 3 chronic kidney disease ([place]) 02/06/2020   Skin lesion of left arm 08/26/2019   Hematuria 08/26/2019   Dementia ([place]) 12/16/2018   Paroxysmal atrial fibrillation ([place]) 04/02/2018   Encounter for therapeutic drug monitoring 03/08/2017   Cognitive impairment 02/27/2017   Preventative health care 10/17/2016   Skin cancer 08/08/2016   Hyperglycemia 08/08/2016   Back pain 08/08/2016   Dermatitis 04/05/2016   Aortic atherosclerosis ([place]) 11/10/2015   Acute on chronic combined systolic and diastolic CHF (congestive heart failure) ([place]) 02/06/2015   Cirrhosis, nonalcoholic ([place]) 65/04/5463   Allergic rhinitis 09/09/2013   Hypokalemia 06/18/2013   Hypoalbuminemia 06/16/2013   Left ankle pain 06/07/2013   Thrombocytopenia ([place]) 11/30/2011   Edema leg    OSA (obstructive sleep apnea) 09/16/2011   Abdominal pain 04/14/2011   Hx of CABG    Hypertension    Chronic systolic heart failure (HCC)    Mitral regurgitation    Chronotropic incompetence    Hyperlipemia    Crohn's disease of large intestine (HCC)    Ventral hernia    Tremor    Warfarin anticoagulation    Carotid artery disease ([place])    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     albuterol (VENTOLIN HFA) 108 (90 Base) MCG/ACT inhaler Inhale 2 puffs into the lungs every 4 (four) hours as needed for wheezing or shortness of breath. 18 g 1   Carboxymethylcellulose Sodium (ARTIFICIAL TEARS OP) Place 1 drop into both eyes 3 (three) times daily as needed (dry eyes).     cholecalciferol (VITAMIN D) 1000 UNITS tablet Take 1,000 Units by mouth every evening.     doxycycline (VIBRA-TABS) 100 MG tablet Take 1 tablet (100 mg total) by mouth 2 (two) times daily for 7 days. 14 tablet 0   Ensure Plus (ENSURE PLUS) LIQD Take 237 mLs by mouth See admin instructions. Takes 237 mls three times a week     fluticasone (FLOVENT HFA) 220 MCG/ACT inhaler Inhale 1 puff into the lungs in the morning and at bedtime. 1 each 1   hydrocortisone cream 1 % Apply 1 application topically daily as needed for itching.     levocetirizine (XYZAL) 5 MG tablet TAKE 1 TABLET BY MOUTH ONCE DAILY IN THE EVENING 30 tablet 0   Loperamide HCl (LOPERAMIDE A-D PO) Take 1 tablet by mouth 2 (two) times daily as needed (Diarrhea).     memantine (NAMENDA) 10 MG tablet Take 1 tablet by mouth twice daily 60 tablet 5   Menthol, Topical Analgesic, (BIOFREEZE EX) Apply 1  application topically daily as needed (pain).     midodrine (PROAMATINE) 10 MG tablet Take 1 tablet (10 mg total) by mouth 3 (three) times daily with meals. 90 tablet 2   pantoprazole (PROTONIX) 40 MG tablet TAKE 1 TABLET BY MOUTH ONCE DAILY AT NOON 90 tablet 3   Simethicone (GAS-X MAXIMUM STRENGTH PO) Take 1-2 tablets by mouth daily as needed (Flatulence).     vitamin B-12 (CYANOCOBALAMIN) 1000 MCG tablet Take 500 mcg by mouth daily.      warfarin (COUMADIN) 5 MG tablet TAKE 1/2-1 TABLET DAILY  AS DIRECTED BY THE COUMADIN CLINIC 90  tablet 0   donepezil (ARICEPT) 10 MG tablet Take 1 tablet (10 mg total) by mouth at bedtime. (Patient not taking: Reported on 09/03/2020) 90 tablet 3   magnesium gluconate (MAGONATE) 500 MG tablet Take 500 mg by mouth daily as needed (cramps). (Patient not taking: Reported on 09/03/2020)     No current facility-administered medications for this visit.    Allergies: Patient has no known allergies.  Past Medical History:  Diagnosis Date   AAA (abdominal aortic aneurysm) ([place])    Abdominal pain 04/14/2011   Acute bronchitis 09/02/2013   Acute vascular insufficiency of intestine (HCC)    Mesenteric embolus...surgery 2003   Allergic rhinitis 09/09/2013   Anxiety    Aortic atherosclerosis ([place]) 11/10/2015   Atrial fibrillation (HCC)    paroxysmal   Back pain 08/08/2016   Biventricular implantable cardiac defibrillator in situ    GDT CONTAK H170-MADIT-CRT-EXPLANTED 2011 implanted defibrillator- Guidant cognis, model n119-2001 Dr. Caryl Comes (11/28/2009)   Breast tenderness    Spironolactone was stopped and eplerinone started. Patient feels better   CAD (coronary artery disease)    CABG 1994  /   catheterization 1996, occluded vein graft to the diagonal, other grafts patent  /   catheterization 2006, no PCI  /  nuclear, October, 2011, extensive scar anteroseptal and apical, no ischemia   Carotid artery disease ([place])    Doppler, February,  2012, 0-39% bilateral, recommended followup 1 year   Chronic systolic heart failure ([place])    chronic systolic    Chronotropic incompetence    Cirrhosis, nonalcoholic ([place]) 8/81/1031   Colonic polyp 2010   pathology not clear.    Crohn's disease ([place])    Degenerative joint disease    Dermatitis 04/05/2016   Dizziness    Positional dizziness   Edema leg    Venous Dopplers 8/13: No DVT bilaterally   Ejection fraction    EF 35%, echo, November, 2011  //   Is 35-40%, echo,  September 23, 2011    Fatigue 06/25/2008   Qualifier: Diagnosis of  By: [name], MD, [facility], Dorinda Hill    GERD 02/21/2007   Qualifier: Diagnosis of  By: Ronnald Ramp CNA/MA, Jessica     Hyperglycemia 08/08/2016   Hyperlipemia    Hypertension    Hypertrophy of prostate with urinary obstruction and other lower urinary tract symptoms (LUTS)    Hypoalbuminemia 06/16/2013   Hypokalemia 06/18/2013   Implantable cardioverter-defibrillator (ICD) in situ 12/09/2009   Annotation: EXPLANTED 2011   //   New Guidant ICD implanted 2011      Iron deficiency anemia    Ischemic cardiomyopathy    CABG 1994 CAD catherization 1996.[name] occluded vein graft to the diagnol, other grafts patent/  catherization 2006, no PCI/ nuclear.[name] october 2011.[name] extensive scar anteroseptal and apical.. no ischemia     LBBB (left bundle branch block)    MCI (mild cognitive  impairment) 02/27/2017   Mesenteric thrombosis (HCC) 03/12/2010   Mitral regurgitation    mild.[name] echo november 2011  EF 35%...echo.. november 2009/ ef 35%.[name] echo november 2011   Oral thrush 02/05/2015   OSA (obstructive sleep apnea) 09/16/2011   NPSG 2013:  AHI 34/hr.     Polymyalgia rheumatica (HCC)    Shingles    Sinus bradycardia    Skin cancer 08/08/2016   Thrombocytopenia ([place]) 2008   Thrush    Tremor    Ventral hernia    multiple, wears an abd binder, reports he hs been told he was at increased riisk for surgery   Vitamin B12 deficiency    Vitamin D deficiency 11/06/2008   Qualifier: Diagnosis of  By: Lenna Gilford MD, Deborra Medina    Warfarin anticoagulation    Coumadin therapy for mesenteric embolus 2003    Past Surgical History:  Procedure Laterality Date   BIV ICD GENERATOR CHANGEOUT N/A 10/23/2017   Procedure: BIV ICD GENERATOR CHANGEOUT;  Surgeon: Deboraha Sprang, MD;  Location: [facility];  Service: Cardiovascular;  Laterality: N/A;   CATARACT EXTRACTION, BILATERAL  2014   COLONOSCOPY N/A 04/19/2013   Procedure: COLONOSCOPY;  Surgeon: Jerene Bears, MD;  Location: [facility] ENDOSCOPY;  Service: Endoscopy;  Laterality: N/A;   CORONARY ARTERY BYPASS GRAFT  1994    Elap w/ superior mesenteric artery embolectomy  1/03   by Dr. Jennette Banker   ESOPHAGOGASTRODUODENOSCOPY N/A 04/18/2013   Procedure: ESOPHAGOGASTRODUODENOSCOPY (EGD);  Surgeon: Jerene Bears, MD;  Location: [facility] ENDOSCOPY;  Service: Endoscopy;  Laterality: N/A;   implantation of biventric cardioverter-defibrillatorr     by Dr. Lovena Le in 8/06 Guidant Contak H170-explanted 2011   implantation of guidant cognis device     model n119-2011   laparoscopic cholecystectomy  2002   left inguinal hernia repair with mesh  6/08   by Dr. Betsy Pries   PROSTATE SURGERY  08/2012   by urology in [place]   Repair of incarcerated ventral hernia and lysis of adhesions  11/03   by Dr. Betsy Pries    Family History  Problem Relation Age of Onset   Heart disease Mother    Tremor Sister    Heart failure Sister    Heart disease Father 52   Hypertension Father    Appendicitis Brother        ruptured   Diabetes Son    Heart disease Sister    Hypertension Sister    Dementia Sister    Heart disease Sister    Hypertension Brother    Kidney disease Brother    Myasthenia gravis Brother    Cirrhosis Brother    Leukemia Brother    Hypertension Other    Hyperlipidemia Other    Heart disease Sister    Edema Sister        pedal edema    Social History   Tobacco Use   Smoking status: Former    Packs/day: 1.00    Years: 20.00    Pack years: 20.00    Types: Cigarettes    Quit date: 01/31/1973    Years since quitting: 47.6   Smokeless tobacco: Never  Substance Use Topics   Alcohol use: Yes    Alcohol/week: 0.0 standard drinks    Comment: occasional    Subjective:   Seen for hospital follow up; accompanied by wife and granddaughter; Was discharged home on hospice care- family members note they are struggling with decision about whether hospice is the right decision; needs BMP to  be re-checked today;  Granddaughter is questioning if Torsemide could be used occasionally to help with chronic swelling regardless of  kidney functions;     Objective:  Vitals:   09/03/20 1452  BP: 100/60  Pulse: 60  Temp: (!) 97.5 F (36.4 C)  TempSrc: Oral  SpO2: 98%  Weight: 173 lb (78.5 kg)  Height: 6' (1.829 m)    General: Well developed, well nourished, in no acute distress; non verbal- sleeps during course of visit  Skin : Warm and dry. Yellowish skin color noted Head: Normocephalic and atraumatic  Lungs: Respirations unlabored;  Neurologic: not alert/ does not participate in course of visit   Plan:   Family is struggling with decision regarding hospice and would prefer to talk directly with PCP; agree will update labs today and forward to PCP;  Agree that can give Torsemide 20 mg qd prn- wife and family understand complications related to kidney function and CHF and cirrhosis; I did ask family about patient's color as he appeared jaundiced in office but they felt it was normal color for him.   Time spent 30 minutes  No follow-ups on file.  Orders Placed This Encounter  Procedures   Comp Met (CMET)   CBC with Differential/Platelet   Ammonia    Requested Prescriptions    No prescriptions requested or ordered in this encounter

## 2020-09-04 ENCOUNTER — Telehealth: Payer: Self-pay

## 2020-09-04 LAB — COMPREHENSIVE METABOLIC PANEL
ALT: 10 U/L (ref 0–53)
AST: 22 U/L (ref 0–37)
Albumin: 3.3 g/dL — ABNORMAL LOW (ref 3.5–5.2)
Alkaline Phosphatase: 50 U/L (ref 39–117)
BUN: 58 mg/dL — ABNORMAL HIGH (ref 6–23)
CO2: 26 mEq/L (ref 19–32)
Calcium: 9.1 mg/dL (ref 8.4–10.5)
Chloride: 109 mEq/L (ref 96–112)
Creatinine, Ser: 3.6 mg/dL — ABNORMAL HIGH (ref 0.40–1.50)
GFR: 14.57 mL/min — CL (ref >60.00)
Glucose, Bld: 108 mg/dL — ABNORMAL HIGH (ref 70–99)
Potassium: 4.3 mEq/L (ref 3.5–5.1)
Sodium: 143 mEq/L (ref 135–145)
Total Bilirubin: 1.7 mg/dL — ABNORMAL HIGH (ref 0.2–1.2)
Total Protein: 5.1 g/dL — ABNORMAL LOW (ref 6.0–8.3)

## 2020-09-04 LAB — CBC WITH DIFFERENTIAL/PLATELET
Basophils Absolute: 0 10*3/uL (ref 0.0–0.1)
Basophils Relative: 0.6 % (ref 0.0–3.0)
Eosinophils Absolute: 0.1 10*3/uL (ref 0.0–0.7)
Eosinophils Relative: 1.6 % (ref 0.0–5.0)
HCT: 26.8 % — ABNORMAL LOW (ref 39.0–52.0)
Hemoglobin: 9 g/dL — ABNORMAL LOW (ref 13.0–17.0)
Lymphocytes Relative: 44.4 % (ref 12.0–46.0)
Lymphs Abs: 3.2 10*3/uL (ref 0.7–4.0)
MCHC: 33.4 g/dL (ref 30.0–36.0)
MCV: 91.5 fl (ref 78.0–100.0)
Monocytes Absolute: 0.6 10*3/uL (ref 0.1–1.0)
Monocytes Relative: 8.9 % (ref 3.0–12.0)
Neutro Abs: 3.2 10*3/uL (ref 1.4–7.7)
Neutrophils Relative %: 44.5 % (ref 43.0–77.0)
Platelets: 59 10*3/uL — ABNORMAL LOW (ref 150.0–400.0)
RBC: 2.92 Mil/uL — ABNORMAL LOW (ref 4.22–5.81)
RDW: 15.1 % (ref 11.5–15.5)
WBC: 7.3 10*3/uL (ref 4.0–10.5)

## 2020-09-04 NOTE — Telephone Encounter (Signed)
Patient scheduled for Tuesday at 4pm for VV.  Advised wife to have some family there to discuss.  Also advised of lab results and that if he has any problems with breathing to take him back to ER and wife stated that patient does not want to go back.

## 2020-09-04 NOTE — Telephone Encounter (Signed)
FYI to PROVIDE.[name] also I have given the provider a verbal.

## 2020-09-04 NOTE — Telephone Encounter (Signed)
Spoke with [place] patient's wife after verifying patients DOB and informed her that the patient needed to repeat ammonia level and wife states she would not be bringing patient back for repeat labs she states patient had a rough day and verbalized understanding when informed that I would notify ordering physician.

## 2020-09-04 NOTE — Telephone Encounter (Signed)
CRITICAL VALUE STICKER  CRITICAL VALUE: GFR 14.57  RECEIVER (on-site recipient of call): Justice NOTIFIED: 09/04/2020 at 11:45 am  MESSENGER (representative from lab): Saa  MD NOTIFIED: Jodi Mourning  TIME OF NOTIFICATION: 1:57 pm  RESPONSE:

## 2020-09-04 NOTE — Telephone Encounter (Signed)
Patient was scheduled with [name] yesterday and she felt like that they should still see you.  Are you ok with VV at 4pm on Tuesday 09/08/20 and maybe work his wife in somewhere else.  He seemed very sickly yesterday.

## 2020-09-04 NOTE — Telephone Encounter (Signed)
Should he still be on?  Patient may be having appt with you on Tuesday.

## 2020-09-07 NOTE — Progress Notes (Deleted)
Cardiology Office Note    Date:  09/07/2020   ID:  [name], [name] October 17, 1931, MRN [id number]   PCP:  [name], Vincent  Cardiologist:  Sherren Mocha, MD *** Advanced Practice Provider:  No care team member to display Electrophysiologist:  Virl Axe, MD   [phone]   No chief complaint on file.   History of Present Illness:  [name] is a 87 y.o. male with history of CAD status post CABG 1994, ischemic cardiomyopathy EF 35 to 40% on echo 00/8676, chronic systolic CHF, ICD last generator change 2019, PAF on Coumadin, mild dementia.   Patient was seen by Kerin Ransom, PA-C 02/06/2020 for follow-up of CHF after diuretics were increased in December.  Weight was back down to 176 pounds.  He still had some lower extremity edema but breathing was better.  Creatinine jumped to 2.0 with increased diuretics so torsemide was decreased to 40 mg daily.  He could take extra if needed.  Patient was volume overloaded at office visit 08/24/2020 with Dr. Caryl Comes.  Torsemide increased from 40-80 for 3 days and then alternating.  He was told to go to the ER after Bumex came back and he also had supratherapeutic INR and was hypotensive.  He had AKI with creatinine of 4.69 and was treated with albumin and IV fluids.  Creatinine was 3.7 at discharge.  Palliative medicine was consulted.  Patient was discharged from the hospital 08/30/2020 and carvedilol lisinopril torsemide potassium and simvastatin were all stopped.  Dr. Caryl Comes spoke with the patient's wife 09/01/2020 and recommended turning her ICD DR off with worsening renal failure and hospice.  Not all of family was agreeable at that point.    Past Medical History:  Diagnosis Date   AAA (abdominal aortic aneurysm) ([place])    Abdominal pain 04/14/2011   Acute bronchitis 09/02/2013   Acute vascular insufficiency of intestine (HCC)    Mesenteric embolus...surgery 2003   Allergic rhinitis 09/09/2013   Anxiety     Aortic atherosclerosis ([place]) 11/10/2015   Atrial fibrillation (HCC)    paroxysmal   Back pain 08/08/2016   Biventricular implantable cardiac defibrillator in situ    GDT CONTAK H170-MADIT-CRT-EXPLANTED 2011 implanted defibrillator- Guidant cognis, model n119-2001 Dr. Caryl Comes (11/28/2009)   Breast tenderness    Spironolactone was stopped and eplerinone started. Patient feels better   CAD (coronary artery disease)    CABG 1994  /   catheterization 1996, occluded vein graft to the diagonal, other grafts patent  /   catheterization 2006, no PCI  /  nuclear, October, 2011, extensive scar anteroseptal and apical, no ischemia   Carotid artery disease ([place])    Doppler, February,  2012, 0-39% bilateral, recommended followup 1 year   Chronic systolic heart failure ([place])    chronic systolic    Chronotropic incompetence    Cirrhosis, nonalcoholic ([place]) 1/95/0932   Colonic polyp 2010   pathology not clear.    Crohn's disease ([place])    Degenerative joint disease    Dermatitis 04/05/2016   Dizziness    Positional dizziness   Edema leg    Venous Dopplers 8/13: No DVT bilaterally   Ejection fraction    EF 35%, echo, November, 2011  //   Is 35-40%, echo,  September 23, 2011    Fatigue 06/25/2008   Qualifier: Diagnosis of  By: [name], MD, Leonidas Romberg Dorinda Hill    GERD 02/21/2007   Qualifier: Diagnosis of  By: Ronnald Ramp  CNA/MA, Janett Billow     Hyperglycemia 08/08/2016   Hyperlipemia    Hypertension    Hypertrophy of prostate with urinary obstruction and other lower urinary tract symptoms (LUTS)    Hypoalbuminemia 06/16/2013   Hypokalemia 06/18/2013   Implantable cardioverter-defibrillator (ICD) in situ 12/09/2009   Annotation: EXPLANTED 2011   //   New Guidant ICD implanted 2011      Iron deficiency anemia    Ischemic cardiomyopathy    CABG 1994 CAD catherization 1996.[name] occluded vein graft to the diagnol, other grafts patent/  catherization 2006, no PCI/ nuclear.[name] october 2011.[name] extensive scar anteroseptal and apical.. 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 november 2009/ ef 35%.[name] echo november 2011   Oral thrush 02/05/2015   OSA (obstructive sleep apnea) 09/16/2011   NPSG 2013:  AHI 34/hr.     Polymyalgia rheumatica (HCC)    Shingles    Sinus bradycardia    Skin cancer 08/08/2016   Thrombocytopenia ([place]) 2008   Thrush    Tremor    Ventral hernia    multiple, wears an abd binder, reports he hs been told he was at increased riisk for surgery   Vitamin B12 deficiency    Vitamin D deficiency 11/06/2008   Qualifier: Diagnosis of  By: Lenna Gilford MD, Deborra Medina    Warfarin anticoagulation    Coumadin therapy for mesenteric embolus 2003    Past Surgical History:  Procedure Laterality Date   BIV ICD GENERATOR CHANGEOUT N/A 10/23/2017   Procedure: BIV ICD GENERATOR CHANGEOUT;  Surgeon: Deboraha Sprang, MD;  Location: [facility];  Service: Cardiovascular;  Laterality: N/A;   CATARACT EXTRACTION, BILATERAL  2014   COLONOSCOPY N/A 04/19/2013   Procedure: COLONOSCOPY;  Surgeon: Jerene Bears, MD;  Location: [facility] ENDOSCOPY;  Service: Endoscopy;  Laterality: N/A;   CORONARY ARTERY BYPASS GRAFT  1994   Elap w/ superior mesenteric artery embolectomy  1/03   by Dr. Jennette Banker   ESOPHAGOGASTRODUODENOSCOPY N/A 04/18/2013   Procedure: ESOPHAGOGASTRODUODENOSCOPY (EGD);  Surgeon: Jerene Bears, MD;  Location: [facility] ENDOSCOPY;  Service: Endoscopy;  Laterality: N/A;   implantation of biventric cardioverter-defibrillatorr     by Dr. Lovena Le in 8/06 Guidant Contak H170-explanted 2011   implantation of guidant cognis device     model n119-2011   laparoscopic cholecystectomy  2002   left inguinal hernia repair with mesh  6/08   by Dr. Betsy Pries   PROSTATE SURGERY  08/2012   by urology in [place]   Repair of incarcerated ventral hernia and lysis of adhesions  11/03   by Dr.  Betsy Pries    Current Medications: No outpatient medications have been marked as taking for the 09/16/20 encounter (Appointment) with Imogene Burn, PA-C.     Allergies:   Patient has no known allergies.   Social History   Socioeconomic History   Marital status: Married    Spouse name: Kennyth Lose   Number of children: 3   Years of education: Not on file   Highest education level: Not on file  Occupational History   Occupation: bull Actuary   Occupation: farmer    Employer: RETIRED   Occupation: logger  Tobacco Use   Smoking status: Former    Packs/day: 1.00    Years: 20.00    Pack years: 20.00  Types: Cigarettes    Quit date: 01/31/1973    Years since quitting: 47.6   Smokeless tobacco: Never  Vaping Use   Vaping Use: Never used  Substance and Sexual Activity   Alcohol use: Yes    Alcohol/week: 0.0 standard drinks    Comment: occasional   Drug use: No   Sexual activity: Never    Comment: lives with wife, no dietary restrictions  Other Topics Concern   Not on file  Social History Narrative   Lives in [place], [place] with wife.    Social Determinants of Health   Financial Resource Strain: Not on file  Food Insecurity: Not on file  Transportation Needs: Not on file  Physical Activity: Not on file  Stress: Not on file  Social Connections: Not on file     Family History:  The patient's ***family history includes Appendicitis in his brother; Cirrhosis in his brother; Dementia in his sister; Diabetes in his son; Edema in his sister; Heart disease in his mother, sister, sister, and sister; Heart disease (age of onset: 31) in his father; Heart failure in his sister; Hyperlipidemia in an other family member; Hypertension in his brother, father, sister, and another family member; Kidney disease in his brother; Leukemia in his brother; Myasthenia gravis in his brother; Tremor in his sister.   ROS:   Please see the history of present illness.    ROS All other systems  reviewed and are negative.   PHYSICAL EXAM:   VS:  There were no vitals taken for this visit.  Physical Exam  GEN: Well nourished, well developed, in no acute distress  HEENT: normal  Neck: no JVD, carotid bruits, or masses Cardiac:RRR; no murmurs, rubs, or gallops  Respiratory:  clear to auscultation bilaterally, normal work of breathing GI: soft, nontender, nondistended, + BS Ext: without cyanosis, clubbing, or edema, Good distal pulses bilaterally MS: no deformity or atrophy  Skin: warm and dry, no rash Neuro:  Alert and Oriented x 3, Strength and sensation are intact Psych: euthymic mood, full affect  Wt Readings from Last 3 Encounters:  09/03/20 173 lb (78.5 kg)  08/30/20 173 lb 1 oz (78.5 kg)  08/24/20 169 lb (76.7 kg)      Studies/Labs Reviewed:   EKG:  EKG is*** ordered today.  The ekg ordered today demonstrates ***  Recent Labs: 09/27/2019: TSH 1.91 02/05/2020: BNP 136.2 05/06/2020: NT-Pro BNP 975 09/03/2020: ALT 10; BUN 58; Creatinine, Ser 3.60; Hemoglobin 9.0; Platelets 59.0; Potassium 4.3; Sodium 143   Lipid Panel    Component Value Date/Time   CHOL 119 09/27/2019 1400   TRIG 63 09/27/2019 1400   HDL 53 09/27/2019 1400   CHOLHDL 2.2 09/27/2019 1400   VLDL 23.2 01/01/2019 1011   LDLCALC 52 09/27/2019 1400   LDLDIRECT 81.6 12/30/2013 1213    Additional studies/ records that were reviewed today include:   2D echo 12/04/2019 IMPRESSIONS     1. Left ventricular ejection fraction, by estimation, is 35 to 40%. The  left ventricle has moderately decreased function. The left ventricle  demonstrates global hypokinesis. There is mild concentric left ventricular  hypertrophy. Left ventricular  diastolic parameters are consistent with Grade I diastolic dysfunction  (impaired relaxation).   2. Right ventricular systolic function is normal. The right ventricular  size is normal.   3. Left atrial size was severely dilated.   4. The mitral valve is normal in structure.  Mild mitral valve  regurgitation. No evidence of mitral stenosis.   5.  The aortic valve is tricuspid. Aortic valve regurgitation is mild. No  aortic stenosis is present.   6. Aortic dilatation noted. There is mild dilatation of the ascending  aorta, measuring 37 mm.   FINDINGS   Left Ventricle: Left ventricular ejection fraction, by estimation, is 35  to 40%. The left ventricle has moderately decreased function. The left  ventricle demonstrates global hypokinesis. The left ventricular internal  cavity size was normal in size.  There is mild concentric left ventricular hypertrophy. Left ventricular  diastolic parameters are consistent with Grade I diastolic dysfunction  (impaired relaxation). Indeterminate filling pressures.   Right Ventricle: The right ventricular size is normal. No increase in  right ventricular wall thickness. Right ventricular systolic function is  normal.   Left Atrium: Left atrial size was severely dilated.   Right Atrium: Right atrial size was normal in size.   Pericardium: There is no evidence of pericardial effusion.   Mitral Valve: The mitral valve is normal in structure. Mild mitral valve  regurgitation. No evidence of mitral valve stenosis.   Tricuspid Valve: The tricuspid valve is normal in structure. Tricuspid  valve regurgitation is mild . No evidence of tricuspid stenosis.   Aortic Valve: The aortic valve is tricuspid. Aortic valve regurgitation is  mild. Aortic regurgitation PHT measures 583 msec. No aortic stenosis is  present.   Pulmonic Valve: The pulmonic valve was normal in structure. Pulmonic valve  regurgitation is not visualized. No evidence of pulmonic stenosis.   Aorta: Aortic dilatation noted. There is mild dilatation of the ascending  aorta, measuring 37 mm.   Venous: The inferior vena cava was not well visualized.   IAS/Shunts: No atrial level shunt detected by color flow Doppler.   Additional Comments: A pacer wire is  visualized.       Risk Assessment/Calculations:   {Does this patient have ATRIAL FIBRILLATION?:[phone]}     ASSESSMENT:    No diagnosis found.   PLAN:  In order of problems listed above:  Ischemic cardiomyopathy  Chronic systolic and diastolic CHF  CAD previous CABG  ICD followed by Dr. Caryl Comes  AKI  Cryptogenic cirrhosis nonalcoholic  Hypotension     Shared Decision Making/Informed Consent   {Are you ordering a CV Procedure (e.g. stress test, cath, DCCV, TEE, etc)?   Press F2        :[id number]}    Medication Adjustments/Labs and Tests Ordered: Current medicines are reviewed at length with the patient today.  Concerns regarding medicines are outlined above.  Medication changes, Labs and Tests ordered today are listed in the Patient Instructions below. There are no Patient Instructions on file for this visit.   Signed, Ermalinda Barrios, PA-C  09/07/2020 3:14 PM    [facility] [address], [place], [place]  [address] Phone: [phone]; Fax: [phone]

## 2020-09-08 ENCOUNTER — Telehealth (INDEPENDENT_AMBULATORY_CARE_PROVIDER_SITE_OTHER): Payer: Medicare HMO | Admitting: Family Medicine

## 2020-09-08 ENCOUNTER — Ambulatory Visit (INDEPENDENT_AMBULATORY_CARE_PROVIDER_SITE_OTHER): Payer: Medicare HMO

## 2020-09-08 ENCOUNTER — Other Ambulatory Visit: Payer: Self-pay

## 2020-09-08 DIAGNOSIS — K7210 Chronic hepatic failure without coma: Secondary | ICD-10-CM

## 2020-09-08 DIAGNOSIS — K55069 Acute infarction of intestine, part and extent unspecified: Secondary | ICD-10-CM | POA: Diagnosis not present

## 2020-09-08 DIAGNOSIS — E8809 Other disorders of plasma-protein metabolism, not elsewhere classified: Secondary | ICD-10-CM | POA: Diagnosis not present

## 2020-09-08 DIAGNOSIS — I5022 Chronic systolic (congestive) heart failure: Secondary | ICD-10-CM | POA: Diagnosis not present

## 2020-09-08 DIAGNOSIS — K7460 Unspecified cirrhosis of liver: Secondary | ICD-10-CM

## 2020-09-08 DIAGNOSIS — N170 Acute kidney failure with tubular necrosis: Secondary | ICD-10-CM

## 2020-09-08 DIAGNOSIS — N2889 Other specified disorders of kidney and ureter: Secondary | ICD-10-CM

## 2020-09-08 DIAGNOSIS — N183 Chronic kidney disease, stage 3 unspecified: Secondary | ICD-10-CM

## 2020-09-08 DIAGNOSIS — I10 Essential (primary) hypertension: Secondary | ICD-10-CM

## 2020-09-08 DIAGNOSIS — Z0000 Encounter for general adult medical examination without abnormal findings: Secondary | ICD-10-CM

## 2020-09-08 DIAGNOSIS — Z5181 Encounter for therapeutic drug level monitoring: Secondary | ICD-10-CM | POA: Diagnosis not present

## 2020-09-08 DIAGNOSIS — R739 Hyperglycemia, unspecified: Secondary | ICD-10-CM

## 2020-09-08 LAB — POCT INR: INR: 1.9 — AB (ref 2.0–3.0)

## 2020-09-08 NOTE — Progress Notes (Signed)
Virtual telephone visit    Virtual Visit via Telephone Note   This visit type was conducted due to national recommendations for restrictions regarding the COVID-19 Pandemic (e.g. social distancing) in an effort to limit this patient's exposure and mitigate transmission in our community. Due to his co-morbid illnesses, this patient is at least at moderate risk for complications without adequate follow up. This format is felt to be most appropriate for this patient at this time. The patient did not have access to video technology or had technical difficulties with video requiring transitioning to audio format only (telephone). Physical exam was limited to content and character of the telephone converstion. S Chism, CMA was able to get the patient set up on a telephone visit.   Patient location: home. Patient, wife and provider in visit Provider location: Office  I discussed the limitations of evaluation and management by telemedicine and the availability of in person appointments. The patient expressed understanding and agreed to proceed.   Visit Date: 09/08/2020  Today's healthcare provider: Penni Homans, MD     Subjective:    Patient ID: [name], male    DOB: 1931-07-19, 87 y.o.   MRN: [id number]  No chief complaint on file.   HPI  History was obtained from wife and physical Examination could not be obtained.  Patient's wife is in today for a telephone visit to discuss hospice care options. In the last few days, they reported that the patient was doing better, but it did not last for long. They state that the patient does not like to go outside of the house. Concerned that taking Warfarin 5 mg 1/2 tablet every day might be too much. Cannot get compression socks on due to increased leg swelling. His last INR was 1.9.   Past Medical History:  Diagnosis Date   AAA (abdominal aortic aneurysm) ([place])    Abdominal pain 04/14/2011   Acute bronchitis 09/02/2013   Acute vascular  insufficiency of intestine (HCC)    Mesenteric embolus...surgery 2003   Allergic rhinitis 09/09/2013   Anxiety    Aortic atherosclerosis ([place]) 11/10/2015   Atrial fibrillation (HCC)    paroxysmal   Back pain 08/08/2016   Biventricular implantable cardiac defibrillator in situ    GDT CONTAK H170-MADIT-CRT-EXPLANTED 2011 implanted defibrillator- Guidant cognis, model n119-2001 Dr. Caryl Comes (11/28/2009)   Breast tenderness    Spironolactone was stopped and eplerinone started. Patient feels better   CAD (coronary artery disease)    CABG 1994  /   catheterization 1996, occluded vein graft to the diagonal, other grafts patent  /   catheterization 2006, no PCI  /  nuclear, October, 2011, extensive scar anteroseptal and apical, no ischemia   Carotid artery disease ([place])    Doppler, February,  2012, 0-39% bilateral, recommended followup 1 year   Chronic systolic heart failure ([place])    chronic systolic    Chronotropic incompetence    Cirrhosis, nonalcoholic ([place]) 5/46/5681   Colonic polyp 2010   pathology not clear.    Crohn's disease ([place])    Degenerative joint disease    Dermatitis 04/05/2016   Dizziness    Positional dizziness   Edema leg    Venous Dopplers 8/13: No DVT bilaterally   Ejection fraction    EF 35%, echo, November, 2011  //   Is 35-40%, echo,  September 23, 2011    Fatigue 06/25/2008   Qualifier: Diagnosis of  By: [name], MD, Leonidas Romberg Dorinda Hill    GERD 02/21/2007  Qualifier: Diagnosis of  By: Ronnald Ramp CNA/MA, Jessica     Hyperglycemia 08/08/2016   Hyperlipemia    Hypertension    Hypertrophy of prostate with urinary obstruction and other lower urinary tract symptoms (LUTS)    Hypoalbuminemia 06/16/2013   Hypokalemia 06/18/2013   Implantable cardioverter-defibrillator (ICD) in situ 12/09/2009   Annotation: EXPLANTED 2011   //   New Guidant ICD implanted 2011      Iron deficiency anemia    Ischemic cardiomyopathy    CABG 1994 CAD catherization 1996.[name] occluded vein graft to the diagnol,  other grafts patent/  catherization 2006, no PCI/ nuclear.[name] october 2011.[name] extensive scar anteroseptal and apical.. 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 november 2009/ ef 35%.[name] echo november 2011   Oral thrush 02/05/2015   OSA (obstructive sleep apnea) 09/16/2011   NPSG 2013:  AHI 34/hr.     Polymyalgia rheumatica (HCC)    Shingles    Sinus bradycardia    Skin cancer 08/08/2016   Thrombocytopenia ([place]) 2008   Thrush    Tremor    Ventral hernia    multiple, wears an abd binder, reports he hs been told he was at increased riisk for surgery   Vitamin B12 deficiency    Vitamin D deficiency 11/06/2008   Qualifier: Diagnosis of  By: Lenna Gilford MD, Deborra Medina    Warfarin anticoagulation    Coumadin therapy for mesenteric embolus 2003    Past Surgical History:  Procedure Laterality Date   BIV ICD GENERATOR CHANGEOUT N/A 10/23/2017   Procedure: BIV ICD GENERATOR CHANGEOUT;  Surgeon: Deboraha Sprang, MD;  Location: [facility];  Service: Cardiovascular;  Laterality: N/A;   CATARACT EXTRACTION, BILATERAL  2014   COLONOSCOPY N/A 04/19/2013   Procedure: COLONOSCOPY;  Surgeon: Jerene Bears, MD;  Location: [facility] ENDOSCOPY;  Service: Endoscopy;  Laterality: N/A;   CORONARY ARTERY BYPASS GRAFT  1994   Elap w/ superior mesenteric artery embolectomy  1/03   by Dr. Jennette Banker   ESOPHAGOGASTRODUODENOSCOPY N/A 04/18/2013   Procedure: ESOPHAGOGASTRODUODENOSCOPY (EGD);  Surgeon: Jerene Bears, MD;  Location[facility] ENDOSCOPY;  Service: Endoscopy;  Laterality: N/A;   implantation of biventric cardioverter-defibrillatorr     by Dr. Lovena Le in 8/06 Guidant Contak H170-explanted 2011   implantation of guidant cognis device     model n119-2011   laparoscopic cholecystectomy  2002   left inguinal hernia repair with mesh  6/08   by Dr. Betsy Pries   PROSTATE SURGERY   08/2012   by urology in [place]   Repair of incarcerated ventral hernia and lysis of adhesions  11/03   by Dr. Betsy Pries    Family History  Problem Relation Age of Onset   Heart disease Mother    Tremor Sister    Heart failure Sister    Heart disease Father 4   Hypertension Father    Appendicitis Brother        ruptured   Diabetes Son    Heart disease Sister    Hypertension Sister    Dementia Sister    Heart disease Sister    Hypertension Brother    Kidney disease Brother    Myasthenia gravis Brother    Cirrhosis Brother    Leukemia Brother    Hypertension Other    Hyperlipidemia Other    Heart  disease Sister    Edema Sister        pedal edema    Social History   Socioeconomic History   Marital status: Married    Spouse name: Kennyth Lose   Number of children: 3   Years of education: Not on file   Highest education level: Not on file  Occupational History   Occupation: bull Actuary   Occupation: farmer    Employer: RETIRED   Occupation: logger  Tobacco Use   Smoking status: Former    Packs/day: 1.00    Years: 20.00    Pack years: 20.00    Types: Cigarettes    Quit date: 01/31/1973    Years since quitting: 47.6   Smokeless tobacco: Never  Vaping Use   Vaping Use: Never used  Substance and Sexual Activity   Alcohol use: Yes    Alcohol/week: 0.0 standard drinks    Comment: occasional   Drug use: No   Sexual activity: Never    Comment: lives with wife, no dietary restrictions  Other Topics Concern   Not on file  Social History Narrative   Lives in [place], [place] with wife.    Social Determinants of Health   Financial Resource Strain: Not on file  Food Insecurity: Not on file  Transportation Needs: Not on file  Physical Activity: Not on file  Stress: Not on file  Social Connections: Not on file  Intimate Partner Violence: Not on file    Outpatient Medications Prior to Visit  Medication Sig Dispense Refill   acetaminophen (TYLENOL) 650 MG CR  tablet Take 650 mg by mouth daily as needed for pain.     albuterol (VENTOLIN HFA) 108 (90 Base) MCG/ACT inhaler Inhale 2 puffs into the lungs every 4 (four) hours as needed for wheezing or shortness of breath. 18 g 1   Carboxymethylcellulose Sodium (ARTIFICIAL TEARS OP) Place 1 drop into both eyes 3 (three) times daily as needed (dry eyes).     cholecalciferol (VITAMIN D) 1000 UNITS tablet Take 1,000 Units by mouth every evening.     Ensure Plus (ENSURE PLUS) LIQD Take 237 mLs by mouth See admin instructions. Takes 237 mls three times a week     fluticasone (FLOVENT HFA) 220 MCG/ACT inhaler Inhale 1 puff into the lungs in the morning and at bedtime. 1 each 1   hydrocortisone cream 1 % Apply 1 application topically daily as needed for itching.     levocetirizine (XYZAL) 5 MG tablet TAKE 1 TABLET BY MOUTH ONCE DAILY IN THE EVENING 30 tablet 0   Loperamide HCl (LOPERAMIDE A-D PO) Take 1 tablet by mouth 2 (two) times daily as needed (Diarrhea).     memantine (NAMENDA) 10 MG tablet Take 1 tablet by mouth twice daily 60 tablet 5   Menthol, Topical Analgesic, (BIOFREEZE EX) Apply 1 application topically daily as needed (pain).     midodrine (PROAMATINE) 10 MG tablet Take 1 tablet (10 mg total) by mouth 3 (three) times daily with meals. 90 tablet 2   pantoprazole (PROTONIX) 40 MG tablet TAKE 1 TABLET BY MOUTH ONCE DAILY AT NOON 90 tablet 3   Simethicone (GAS-X MAXIMUM STRENGTH PO) Take 1-2 tablets by mouth daily as needed (Flatulence).     simvastatin (ZOCOR) 20 MG tablet Take 1 tablet (20 mg total) by mouth daily at 6 PM. 30 tablet 2   vitamin B-12 (CYANOCOBALAMIN) 1000 MCG tablet Take 500 mcg by mouth daily.      warfarin (COUMADIN) 5 MG  tablet TAKE 1/2-1 TABLET DAILY  AS DIRECTED BY THE COUMADIN CLINIC 90 tablet 0   donepezil (ARICEPT) 10 MG tablet Take 1 tablet (10 mg total) by mouth at bedtime. (Patient not taking: Reported on 09/08/2020) 90 tablet 3   magnesium gluconate (MAGONATE) 500 MG tablet Take  500 mg by mouth daily as needed (cramps). (Patient not taking: No sig reported)     No facility-administered medications prior to visit.    No Known Allergies  Review of Systems  Constitutional:  Negative for chills, fever and malaise/fatigue.  HENT:  Negative for congestion, sinus pain and sore throat.   Eyes:  Negative for blurred vision.  Respiratory:  Negative for cough and shortness of breath.   Cardiovascular:  Positive for leg swelling. Negative for chest pain and palpitations.  Gastrointestinal:  Negative for blood in stool, diarrhea, nausea and vomiting.  Genitourinary:  Negative for flank pain and frequency.  Musculoskeletal:  Negative for back pain.  Skin:  Negative for rash.  Neurological:  Negative for headaches.      Objective:    Physical Exam  There were no vitals taken for this visit. Wt Readings from Last 3 Encounters:  09/03/20 173 lb (78.5 kg)  08/30/20 173 lb 1 oz (78.5 kg)  08/24/20 169 lb (76.7 kg)    Diabetic Foot Exam - Simple   No data filed    Lab Results  Component Value Date   WBC 7.3 09/03/2020   HGB 9.0 (L) 09/03/2020   HCT 26.8 Repeated and verified X2. (L) 09/03/2020   PLT 59.0 (L) 09/03/2020   GLUCOSE 108 (H) 09/03/2020   CHOL 119 09/27/2019   TRIG 63 09/27/2019   HDL 53 09/27/2019   LDLDIRECT 81.6 12/30/2013   LDLCALC 52 09/27/2019   ALT 10 09/03/2020   AST 22 09/03/2020   NA 143 09/03/2020   K 4.3 09/03/2020   CL 109 09/03/2020   CREATININE 3.60 (H) 09/03/2020   BUN 58 (H) 09/03/2020   CO2 26 09/03/2020   TSH 1.91 09/27/2019   PSA 2.93 11/24/2010   INR 1.9 (A) 09/08/2020   HGBA1C 5.0 08/26/2019    Lab Results  Component Value Date   TSH 1.91 09/27/2019   Lab Results  Component Value Date   WBC 7.3 09/03/2020   HGB 9.0 (L) 09/03/2020   HCT 26.8 Repeated and verified X2. (L) 09/03/2020   MCV 91.5 09/03/2020   PLT 59.0 (L) 09/03/2020   Lab Results  Component Value Date   NA 143 09/03/2020   K 4.3 09/03/2020    CO2 26 09/03/2020   GLUCOSE 108 (H) 09/03/2020   BUN 58 (H) 09/03/2020   CREATININE 3.60 (H) 09/03/2020   BILITOT 1.7 (H) 09/03/2020   ALKPHOS 50 09/03/2020   AST 22 09/03/2020   ALT 10 09/03/2020   PROT 5.1 (L) 09/03/2020   ALBUMIN 3.3 (L) 09/03/2020   CALCIUM 9.1 09/03/2020   ANIONGAP 5 08/30/2020   EGFR 12 (L) 08/25/2020   GFR 14.57 (LL) 09/03/2020   Lab Results  Component Value Date   CHOL 119 09/27/2019   Lab Results  Component Value Date   HDL 53 09/27/2019   Lab Results  Component Value Date   LDLCALC 52 09/27/2019   Lab Results  Component Value Date   TRIG 63 09/27/2019   Lab Results  Component Value Date   CHOLHDL 2.2 09/27/2019   Lab Results  Component Value Date   HGBA1C 5.0 08/26/2019  Assessment & Plan:   Problem List Items Addressed This Visit     Hypertension    Monitor and report any concerns, no changes to meds. Encouraged heart healthy diet such as the DASH diet and exercise as tolerated.        Chronic systolic heart failure (HCC) - Primary   Relevant Orders   Ambulatory referral to Hospice   Hypoalbuminemia    Will need to increase protein intake as able       Hyperglycemia    hgba1c acceptable, minimize simple carbs. Increase exercise as tolerated.        Acute renal failure superimposed on stage 3 chronic kidney disease ([place])    Patient is showing decline in many areas including his renal function       Liver failure ([place])    Continues to worsen per family with swelling, jaundice, poor appetite. He is referred to [facility] for consideration. The patient struggles with dementia so the conversation is mostly with his wife.        Other Visit Diagnoses     Chronic renal insufficiency, stage 3 (moderate) ([place])       Relevant Orders   Ambulatory referral to Hospice   CRI (chronic renal insufficiency), stage 3 (moderate) (HCC)       Hepatic cirrhosis, unspecified hepatic cirrhosis type, unspecified  whether ascites present ([place])            No orders of the defined types were placed in this encounter.    I discussed the assessment and treatment plan with the patient. The patient was provided an opportunity to ask questions and all were answered. The patient agreed with the plan and demonstrated an understanding of the instructions.   The patient was advised to call back or seek an in-person evaluation if the symptoms worsen or if the condition fails to improve as anticipated.  I provided 22 minutes of non-face-to-face time during this encounter.   Penni Homans, MD [facility] [phone] (phone) [phone] (fax)  [place], Suezanne Jacquet, acting as a scribe for Penni Homans, MD, have documented all relevent documentation on behalf of Penni Homans, MD, as directed by Penni Homans, MD while in the presence of Penni Homans, MD.  I, [name], MD personally performed the services described in this documentation. All medical record entries made by the scribe were at my direction and in my presence. I have reviewed the chart and agree that the record reflects my personal performance and is accurate and complete

## 2020-09-08 NOTE — Patient Instructions (Signed)
Take 1 tablet today only and then continue taking 1/2 tablet daily  except for 1 tablet on Monday and Friday. Recheck in 4 weeks.   Call us with any medication changes or concerns # [phone].

## 2020-09-09 NOTE — Assessment & Plan Note (Signed)
hgba1c acceptable, minimize simple carbs. Increase exercise as tolerated.  

## 2020-09-09 NOTE — Assessment & Plan Note (Signed)
Patient is showing decline in many areas including his renal function

## 2020-09-09 NOTE — Assessment & Plan Note (Signed)
Continues to worsen per family with swelling, jaundice, poor appetite. He is referred to [place] for consideration. The patient struggles with dementia so the conversation is mostly with his wife.

## 2020-09-09 NOTE — Assessment & Plan Note (Signed)
Monitor and report any concerns, no changes to meds. Encouraged heart healthy diet such as the DASH diet and exercise as tolerated.  ?

## 2020-09-09 NOTE — Assessment & Plan Note (Signed)
Will need to increase protein intake as able

## 2020-09-11 NOTE — Progress Notes (Signed)
Remote ICD transmission.   

## 2020-09-16 ENCOUNTER — Ambulatory Visit: Payer: Medicare HMO | Admitting: Physician Assistant

## 2020-09-21 ENCOUNTER — Ambulatory Visit (INDEPENDENT_AMBULATORY_CARE_PROVIDER_SITE_OTHER): Payer: Medicare HMO

## 2020-09-21 DIAGNOSIS — Z95810 Presence of automatic (implantable) cardiac defibrillator: Secondary | ICD-10-CM | POA: Diagnosis not present

## 2020-09-21 DIAGNOSIS — I5022 Chronic systolic (congestive) heart failure: Secondary | ICD-10-CM

## 2020-09-23 NOTE — Progress Notes (Signed)
EPIC Encounter for ICM Monitoring  Patient Name: [name] is a 87 y.o. male Date: 09/23/2020 Primary Care Physican: [name], MD Primary Cardiologist: [place], [place] Electrophysiologist: Caryl Comes 05/06/2020 Office Weight: 171 lbs Right Ventricular: 95% Left Ventricular:   94%   Total Time in AT/AF  0 hours         Transmission reviewed.   09/21/2020 [employer] Heart Failure Index 3 suggesting fluid levels within normal limits.     Prescribed:  Torsemide 20 mg Take 2 tablets (40 mg total) by mouth daily.  Per 4/6 OV note with Ermalinda Barrios, PA, patient can take extra Torsemide when needed with extra Potassium. Potassium 20 mEq 1 tablet daily   Labs: 05/06/2020 Creatinine 1.88, BUN 20, Potassium 4.4, Sodium 147, GFR 34 02/05/2020 Creatinine 2.09, BUN 28, Potassium 4.0, Sodium 143, GFR 28-32 01/22/2020 Creatinine 1.75, BUN 24, Potassium 4.5, Sodium 143, GFR 34-40  A complete set of results can be found in Results Review.   Recommendations:  No changes   Follow-up plan: ICM clinic phone appointment on 11/02/2020.   91 day device clinic remote transmission 11/19/2020.              EP/Cardiology next office visit:  None scheduled          Copy of ICM check sent to Dr. Caryl Comes.   3 Month Trend    8 Day Data Trend          Rosalene Billings, RN 09/23/2020 3:12 PM

## 2020-09-28 NOTE — Progress Notes (Signed)
Per Device clinic, tach therapies have been disabled and pt is now in hospice.  No further ICM follow up.

## 2020-10-31 DEATH — deceased

## 2023-05-29 ENCOUNTER — Other Ambulatory Visit (HOSPITAL_BASED_OUTPATIENT_CLINIC_OR_DEPARTMENT_OTHER): Payer: Self-pay
